# Patient Record
Sex: Female | Born: 1935 | Race: White | Hispanic: No | Smoking: Never smoker
Health system: Southern US, Community
[De-identification: ages and names within clinical notes are randomized; demographics above are authoritative.]

## PROBLEM LIST (undated history)

## (undated) ENCOUNTER — Emergency Department (HOSPITAL_COMMUNITY): Admission: EM | Payer: BLUE CROSS/BLUE SHIELD | Source: Home / Self Care

## (undated) DIAGNOSIS — I10 Essential (primary) hypertension: Secondary | ICD-10-CM

## (undated) DIAGNOSIS — H269 Unspecified cataract: Secondary | ICD-10-CM

## (undated) DIAGNOSIS — K2970 Gastritis, unspecified, without bleeding: Secondary | ICD-10-CM

## (undated) DIAGNOSIS — F0390 Unspecified dementia without behavioral disturbance: Secondary | ICD-10-CM

## (undated) DIAGNOSIS — K649 Unspecified hemorrhoids: Secondary | ICD-10-CM

## (undated) DIAGNOSIS — R55 Syncope and collapse: Secondary | ICD-10-CM

## (undated) DIAGNOSIS — K529 Noninfective gastroenteritis and colitis, unspecified: Secondary | ICD-10-CM

## (undated) DIAGNOSIS — K922 Gastrointestinal hemorrhage, unspecified: Secondary | ICD-10-CM

## (undated) DIAGNOSIS — I8290 Acute embolism and thrombosis of unspecified vein: Secondary | ICD-10-CM

## (undated) DIAGNOSIS — W19XXXA Unspecified fall, initial encounter: Secondary | ICD-10-CM

## (undated) DIAGNOSIS — K449 Diaphragmatic hernia without obstruction or gangrene: Secondary | ICD-10-CM

## (undated) DIAGNOSIS — M1990 Unspecified osteoarthritis, unspecified site: Secondary | ICD-10-CM

## (undated) DIAGNOSIS — K625 Hemorrhage of anus and rectum: Secondary | ICD-10-CM

## (undated) DIAGNOSIS — E785 Hyperlipidemia, unspecified: Secondary | ICD-10-CM

## (undated) DIAGNOSIS — I639 Cerebral infarction, unspecified: Secondary | ICD-10-CM

## (undated) DIAGNOSIS — K2980 Duodenitis without bleeding: Secondary | ICD-10-CM

## (undated) DIAGNOSIS — K8020 Calculus of gallbladder without cholecystitis without obstruction: Secondary | ICD-10-CM

## (undated) HISTORY — DX: Essential (primary) hypertension: I10

## (undated) HISTORY — PX: HIP FRACTURE SURGERY: SHX118

## (undated) HISTORY — DX: Syncope and collapse: R55

## (undated) HISTORY — PX: OTHER SURGICAL HISTORY: SHX169

## (undated) HISTORY — DX: Unspecified hemorrhoids: K64.9

## (undated) HISTORY — DX: Unspecified osteoarthritis, unspecified site: M19.90

## (undated) HISTORY — DX: Hyperlipidemia, unspecified: E78.5

## (undated) HISTORY — DX: Unspecified cataract: H26.9

## (undated) HISTORY — DX: Acute embolism and thrombosis of unspecified vein: I82.90

## (undated) HISTORY — PX: ABDOMINAL HYSTERECTOMY: SHX81

## (undated) HISTORY — DX: Unspecified fall, initial encounter: W19.XXXA

## (undated) HISTORY — DX: Calculus of gallbladder without cholecystitis without obstruction: K80.20

## (undated) HISTORY — DX: Noninfective gastroenteritis and colitis, unspecified: K52.9

---

## 1999-08-02 HISTORY — PX: WRIST FRACTURE SURGERY: SHX121

## 2000-06-30 ENCOUNTER — Encounter: Admission: RE | Admit: 2000-06-30 | Discharge: 2000-06-30 | Payer: Self-pay | Admitting: Family Medicine

## 2000-06-30 ENCOUNTER — Encounter: Payer: Self-pay | Admitting: Family Medicine

## 2000-09-09 ENCOUNTER — Encounter: Payer: Self-pay | Admitting: Family Medicine

## 2000-09-09 ENCOUNTER — Encounter: Admission: RE | Admit: 2000-09-09 | Discharge: 2000-09-09 | Payer: Self-pay | Admitting: Family Medicine

## 2001-09-14 ENCOUNTER — Encounter: Payer: Self-pay | Admitting: Family Medicine

## 2001-09-14 ENCOUNTER — Encounter: Admission: RE | Admit: 2001-09-14 | Discharge: 2001-09-14 | Payer: Self-pay | Admitting: Family Medicine

## 2003-04-13 ENCOUNTER — Encounter: Payer: Self-pay | Admitting: Family Medicine

## 2003-04-13 ENCOUNTER — Encounter: Admission: RE | Admit: 2003-04-13 | Discharge: 2003-04-13 | Payer: Self-pay | Admitting: Family Medicine

## 2003-07-02 HISTORY — PX: OTHER SURGICAL HISTORY: SHX169

## 2004-04-27 ENCOUNTER — Encounter: Admission: RE | Admit: 2004-04-27 | Discharge: 2004-04-27 | Payer: Self-pay | Admitting: Family Medicine

## 2004-08-22 ENCOUNTER — Ambulatory Visit: Payer: Self-pay | Admitting: Family Medicine

## 2004-08-27 ENCOUNTER — Ambulatory Visit: Payer: Self-pay | Admitting: Family Medicine

## 2004-10-04 ENCOUNTER — Ambulatory Visit: Payer: Self-pay | Admitting: Family Medicine

## 2005-04-04 ENCOUNTER — Ambulatory Visit: Payer: Self-pay | Admitting: Family Medicine

## 2005-05-29 ENCOUNTER — Ambulatory Visit: Payer: Self-pay | Admitting: Family Medicine

## 2005-09-20 ENCOUNTER — Ambulatory Visit: Payer: Self-pay | Admitting: Family Medicine

## 2005-10-16 LAB — FECAL OCCULT BLOOD, GUAIAC: Fecal Occult Blood: NEGATIVE

## 2005-10-23 ENCOUNTER — Ambulatory Visit: Payer: Self-pay | Admitting: Family Medicine

## 2005-11-12 ENCOUNTER — Ambulatory Visit: Payer: Self-pay | Admitting: Family Medicine

## 2006-06-18 ENCOUNTER — Encounter: Admission: RE | Admit: 2006-06-18 | Discharge: 2006-06-18 | Payer: Self-pay | Admitting: Family Medicine

## 2007-03-11 ENCOUNTER — Telehealth (INDEPENDENT_AMBULATORY_CARE_PROVIDER_SITE_OTHER): Payer: Self-pay | Admitting: *Deleted

## 2007-03-16 DIAGNOSIS — M810 Age-related osteoporosis without current pathological fracture: Secondary | ICD-10-CM | POA: Insufficient documentation

## 2007-03-16 DIAGNOSIS — I10 Essential (primary) hypertension: Secondary | ICD-10-CM | POA: Insufficient documentation

## 2007-03-16 DIAGNOSIS — M1990 Unspecified osteoarthritis, unspecified site: Secondary | ICD-10-CM | POA: Insufficient documentation

## 2007-03-17 ENCOUNTER — Ambulatory Visit: Payer: Self-pay | Admitting: Family Medicine

## 2007-03-17 DIAGNOSIS — E7800 Pure hypercholesterolemia, unspecified: Secondary | ICD-10-CM | POA: Insufficient documentation

## 2007-03-19 LAB — CONVERTED CEMR LAB
Basophils Absolute: 0 10*3/uL (ref 0.0–0.1)
Basophils Relative: 0.9 % (ref 0.0–1.0)
CO2: 31 meq/L (ref 19–32)
Chloride: 106 meq/L (ref 96–112)
Cholesterol: 174 mg/dL (ref 0–200)
Creatinine, Ser: 1 mg/dL (ref 0.4–1.2)
Eosinophils Absolute: 0.1 10*3/uL (ref 0.0–0.6)
GFR calc non Af Amer: 57 mL/min
Glucose, Bld: 93 mg/dL (ref 70–99)
HCT: 37.4 % (ref 36.0–46.0)
Hemoglobin: 13 g/dL (ref 12.0–15.0)
Lymphocytes Relative: 30.9 % (ref 12.0–46.0)
MCHC: 34.9 g/dL (ref 30.0–36.0)
Monocytes Relative: 8.8 % (ref 3.0–11.0)
Phosphorus: 3.1 mg/dL (ref 2.3–4.6)
Potassium: 3.3 meq/L — ABNORMAL LOW (ref 3.5–5.1)
RDW: 12.6 % (ref 11.5–14.6)
TSH: 1.99 microintl units/mL (ref 0.35–5.50)
Triglycerides: 176 mg/dL — ABNORMAL HIGH (ref 0–149)
VLDL: 35 mg/dL (ref 0–40)
WBC: 4.6 10*3/uL (ref 4.5–10.5)

## 2007-06-23 ENCOUNTER — Encounter: Admission: RE | Admit: 2007-06-23 | Discharge: 2007-06-23 | Payer: Self-pay | Admitting: Family Medicine

## 2007-06-24 ENCOUNTER — Encounter (INDEPENDENT_AMBULATORY_CARE_PROVIDER_SITE_OTHER): Payer: Self-pay | Admitting: *Deleted

## 2007-08-03 ENCOUNTER — Telehealth: Payer: Self-pay | Admitting: Family Medicine

## 2008-06-27 ENCOUNTER — Ambulatory Visit: Payer: Self-pay | Admitting: Family Medicine

## 2008-07-01 LAB — HM DEXA SCAN

## 2008-07-11 ENCOUNTER — Encounter: Admission: RE | Admit: 2008-07-11 | Discharge: 2008-07-11 | Payer: Self-pay | Admitting: Family Medicine

## 2008-07-11 ENCOUNTER — Encounter: Payer: Self-pay | Admitting: Family Medicine

## 2008-07-18 ENCOUNTER — Ambulatory Visit: Payer: Self-pay | Admitting: Family Medicine

## 2008-07-19 LAB — CONVERTED CEMR LAB
ALT: 14 units/L (ref 0–35)
Albumin: 4.3 g/dL (ref 3.5–5.2)
Basophils Relative: 0.3 % (ref 0.0–3.0)
Bilirubin, Direct: 0.1 mg/dL (ref 0.0–0.3)
CO2: 31 meq/L (ref 19–32)
Calcium: 9.7 mg/dL (ref 8.4–10.5)
Chloride: 104 meq/L (ref 96–112)
Creatinine, Ser: 1.1 mg/dL (ref 0.4–1.2)
Eosinophils Absolute: 0.2 10*3/uL (ref 0.0–0.7)
GFR calc Af Amer: 62 mL/min
GFR calc non Af Amer: 51 mL/min
Glucose, Bld: 98 mg/dL (ref 70–99)
HCT: 40.8 % (ref 36.0–46.0)
HDL: 54.6 mg/dL (ref >39.0)
LDL Cholesterol: 99 mg/dL (ref 0–99)
Lymphocytes Relative: 24.5 % (ref 12.0–46.0)
MCV: 88.3 fL (ref 78.0–100.0)
Monocytes Absolute: 0.6 10*3/uL (ref 0.1–1.0)
Neutro Abs: 3.4 10*3/uL (ref 1.4–7.7)
Phosphorus: 3 mg/dL (ref 2.3–4.6)
Potassium: 3.3 meq/L — ABNORMAL LOW (ref 3.5–5.1)
TSH: 1.97 microintl units/mL (ref 0.35–5.50)
Total CHOL/HDL Ratio: 3.3
VLDL: 25 mg/dL (ref 0–40)

## 2008-08-02 ENCOUNTER — Ambulatory Visit: Payer: Self-pay | Admitting: Family Medicine

## 2008-08-17 ENCOUNTER — Encounter: Admission: RE | Admit: 2008-08-17 | Discharge: 2008-08-17 | Payer: Self-pay | Admitting: Family Medicine

## 2008-08-23 ENCOUNTER — Encounter (INDEPENDENT_AMBULATORY_CARE_PROVIDER_SITE_OTHER): Payer: Self-pay | Admitting: *Deleted

## 2008-10-06 ENCOUNTER — Ambulatory Visit: Payer: Self-pay | Admitting: Family Medicine

## 2008-10-10 LAB — CONVERTED CEMR LAB: Vit D, 25-Hydroxy: 31 ng/mL (ref 30–89)

## 2009-01-09 ENCOUNTER — Ambulatory Visit: Payer: Self-pay | Admitting: Family Medicine

## 2009-01-10 ENCOUNTER — Encounter: Payer: Self-pay | Admitting: Family Medicine

## 2009-01-11 LAB — CONVERTED CEMR LAB: Vit D, 25-Hydroxy: 21 ng/mL — ABNORMAL LOW (ref 30–89)

## 2009-03-25 ENCOUNTER — Ambulatory Visit: Payer: Self-pay | Admitting: Family Medicine

## 2009-03-25 DIAGNOSIS — S20229A Contusion of unspecified back wall of thorax, initial encounter: Secondary | ICD-10-CM | POA: Insufficient documentation

## 2009-03-27 ENCOUNTER — Ambulatory Visit: Payer: Self-pay | Admitting: Family Medicine

## 2009-03-28 ENCOUNTER — Ambulatory Visit: Payer: Self-pay | Admitting: Family Medicine

## 2009-03-28 DIAGNOSIS — E559 Vitamin D deficiency, unspecified: Secondary | ICD-10-CM | POA: Insufficient documentation

## 2009-03-31 ENCOUNTER — Encounter: Payer: Self-pay | Admitting: Family Medicine

## 2009-04-04 ENCOUNTER — Telehealth: Payer: Self-pay | Admitting: Family Medicine

## 2009-04-04 ENCOUNTER — Encounter: Payer: Self-pay | Admitting: Family Medicine

## 2009-08-18 ENCOUNTER — Encounter: Admission: RE | Admit: 2009-08-18 | Discharge: 2009-08-18 | Payer: Self-pay | Admitting: Family Medicine

## 2009-08-22 ENCOUNTER — Encounter: Payer: Self-pay | Admitting: Family Medicine

## 2010-01-19 ENCOUNTER — Ambulatory Visit: Payer: Self-pay | Admitting: Family Medicine

## 2010-01-20 ENCOUNTER — Encounter: Payer: Self-pay | Admitting: Family Medicine

## 2010-01-22 ENCOUNTER — Encounter: Payer: Self-pay | Admitting: Family Medicine

## 2010-01-22 LAB — CONVERTED CEMR LAB
ALT: 18 units/L (ref 0–35)
BUN: 13 mg/dL (ref 6–23)
Eosinophils Absolute: 0.2 10*3/uL (ref 0.0–0.7)
Eosinophils Relative: 2.6 % (ref 0.0–5.0)
LDL Cholesterol: 82 mg/dL (ref 0–99)
MCHC: 34.2 g/dL (ref 30.0–36.0)
MCV: 91.2 fL (ref 78.0–100.0)
Monocytes Relative: 7.9 % (ref 3.0–12.0)
Neutro Abs: 3.7 10*3/uL (ref 1.4–7.7)
Phosphorus: 3.1 mg/dL (ref 2.3–4.6)
RBC: 4.5 M/uL (ref 3.87–5.11)
Sodium: 144 meq/L (ref 135–145)
TSH: 1.75 microintl units/mL (ref 0.35–5.50)
Triglycerides: 166 mg/dL — ABNORMAL HIGH (ref 0.0–149.0)
VLDL: 33.2 mg/dL (ref 0.0–40.0)
Vit D, 25-Hydroxy: 29 ng/mL — ABNORMAL LOW (ref 30–89)

## 2010-04-23 ENCOUNTER — Telehealth: Payer: Self-pay | Admitting: Family Medicine

## 2010-04-26 ENCOUNTER — Ambulatory Visit: Payer: Self-pay | Admitting: Family Medicine

## 2010-05-01 LAB — CONVERTED CEMR LAB: Vit D, 25-Hydroxy: 53 ng/mL (ref 30–89)

## 2010-07-21 ENCOUNTER — Other Ambulatory Visit: Payer: Self-pay | Admitting: Family Medicine

## 2010-07-21 DIAGNOSIS — Z1239 Encounter for other screening for malignant neoplasm of breast: Secondary | ICD-10-CM

## 2010-07-23 ENCOUNTER — Other Ambulatory Visit: Payer: Self-pay | Admitting: Family Medicine

## 2010-07-23 ENCOUNTER — Ambulatory Visit
Admission: RE | Admit: 2010-07-23 | Discharge: 2010-07-23 | Payer: Self-pay | Source: Home / Self Care | Attending: Family Medicine | Admitting: Family Medicine

## 2010-07-23 DIAGNOSIS — M79609 Pain in unspecified limb: Secondary | ICD-10-CM | POA: Insufficient documentation

## 2010-07-23 LAB — RENAL FUNCTION PANEL
Albumin: 4.6 g/dL (ref 3.5–5.2)
Calcium: 10.4 mg/dL (ref 8.4–10.5)
Creatinine, Ser: 1.2 mg/dL (ref 0.4–1.2)
Glucose, Bld: 94 mg/dL (ref 70–99)
Phosphorus: 3.2 mg/dL (ref 2.3–4.6)
Sodium: 138 mEq/L (ref 135–145)

## 2010-07-23 LAB — LIPID PANEL
Cholesterol: 211 mg/dL — ABNORMAL HIGH (ref 0–200)
Total CHOL/HDL Ratio: 3
Triglycerides: 165 mg/dL — ABNORMAL HIGH (ref 0.0–149.0)
VLDL: 33 mg/dL (ref 0.0–40.0)

## 2010-07-23 LAB — AST: AST: 21 U/L (ref 0–37)

## 2010-07-23 LAB — ALT: ALT: 12 U/L (ref 0–35)

## 2010-07-31 NOTE — Miscellaneous (Signed)
Summary: Vitamin D 2000iui med update list  Clinical Lists Changes  Medications: Changed medication from VITAMIN D 2000 UNIT TABS (CHOLECALCIFEROL) Take 1 tablet by mouth once a day to VITAMIN D 2000 UNIT TABS (CHOLECALCIFEROL) Take 2  tablets  by mouth once a day     Current Allergies: ! VITAMIN D (CHOLECALCIFEROL) NORVASC

## 2010-07-31 NOTE — Progress Notes (Signed)
Summary: regarding simvastatin  Phone Note From Pharmacy   [facility]: [facility]  [address] [id number] Summary of Call: Pharmacy is asking if you still want pt to take 80 mg's of simvastatin, with new recommendations that are out regarding geriatric patients. Initial call taken by: Lowella Petties CMA, AAMA,  April 23, 2010 2:21 PM  Follow-up for Phone Call        I'm fine with cutting it to 40 since her labs have been good  let pt know that I am cutting it due to recent geriatric rec for dosing px written on EMR for call in  Follow-up by: Judith Part MD,  April 23, 2010 2:44 PM  Additional Follow-up for Phone Call Additional follow up Details #1::        Left message for patient to call back. [facility] as instructed. Lewanda Rife LPN  April 23, 2010 4:41 PM   Patient notified as instructed by telephone. Lewanda Rife LPN  April 24, 2010 1:35 PM     New/Updated Medications: ZOCOR 40 MG TABS (SIMVASTATIN) 1 by mouth once daily Prescriptions: ZOCOR 40 MG TABS (SIMVASTATIN) 1 by mouth once daily  #30 x 11   Entered and Authorized by:   Judith Part MD   Signed by:   Lewanda Rife LPN on 02/72/5366   Method used:   Telephoned to ...       [facility] [facility] [address] [id number]       [address]       [place], [place]  [address]       Ph: [phone]       Fax: [phone]   RxID:   [id number]

## 2010-07-31 NOTE — Letter (Signed)
Summary: Results Follow up Letter  [place] at [facility]  [address] [place], [place] [address]   Phone: [phone]  Fax: [phone]    08/22/2009 MRN: [id number]    [address] [facility] [address] [place], [place]  [address]    Dear [name],  The following are the results of your recent test(s):  Test         Result    Pap Smear:        Normal _____  Not Normal _____ Comments: ______________________________________________________ Cholesterol: LDL(Bad cholesterol):         Your goal is less than:         HDL (Good cholesterol):       Your goal is more than: Comments:  ______________________________________________________ Mammogram:        Normal __X___  Not Normal _____ Comments:Please repeat in one year.  ___________________________________________________________________ Hemoccult:        Normal _____  Not normal _______ Comments:    _____________________________________________________________________ Other Tests:    We routinely do not discuss normal results over the telephone.  If you desire a copy of the results, or you have any questions about this information we can discuss them at your next office visit.   Sincerely,    Roxy Manns, MD  MT/ri

## 2010-07-31 NOTE — Assessment & Plan Note (Signed)
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 Vitamin D (Cholecalciferol) 2)  Norvasc  Past History:  Past Medical History: Last updated: 06/27/2008 Hypertension Osteoarthritis Osteoporosis hyperlipidemia  Past Surgical History: Last updated: 07/14/2008 Hysterectomy/ BSO- fibroids Right arm fracture- distal radius (08/1999) ABI's- normal  Left foot brace Dexa- osteoporosis (07/2003) Dexa- OP (1/10)- worse at [facility] and imp at [facility]  Family History: Last updated: 03/16/2007 Father: MI, HTN Mother: MI,HTN Siblings: 4 brothers- 1 with throat cancer. 1 sister  Social History: Last updated: 06/27/2008 Marital Status: widowed Children:four daughters, 1 son Occupation: retired lives with 2 teenage grandkids and her daughter   Risk Factors: Smoking Status: never (03/16/2007)  Review of Systems General:  Denies fatigue, loss of appetite, and malaise. Eyes:  Denies blurring and eye irritation. CV:  Denies chest pain or discomfort, lightheadness, palpitations, shortness of breath with exertion, and swelling of feet. Resp:  Denies cough, shortness of breath, and wheezing. GI:  Denies abdominal pain, change in bowel habits, indigestion, nausea,  and vomiting. GU:  Denies dysuria and hematuria. MS:  Complains of joint pain and stiffness; denies cramps and muscle weakness. Derm:  Denies itching, lesion(s), poor wound healing, and rash. Neuro:  Denies numbness, tingling, and weakness. Psych:  Denies anxiety and depression. Endo:  Denies cold intolerance, excessive thirst, excessive urination, and heat intolerance. Heme:  Denies abnormal bruising and bleeding.  Physical Exam  General:  elderly female in no distress Head:  normocephalic, atraumatic, and no abnormalities observed.   Eyes:  vision grossly intact, pupils equal, pupils round, and pupils reactive to light.   Mouth:  pharynx pink and moist.   Neck:  supple with full rom and no masses or thyromegally, no JVD or carotid bruit  Chest Wall:  No deformities, masses, or tenderness noted. Lungs:  Normal respiratory effort, chest expands symmetrically. Lungs are clear to auscultation, no crackles or wheezes. Heart:  Normal rate and regular rhythm. S1 and S2 normal without gallop, murmur, click, rub or other extra sounds. Abdomen:  Bowel sounds positive,abdomen soft and non-tender without masses, organomegaly or hernias noted. no renal bruits  Msk:  No deformity or scoliosis noted of thoracic or lumbar spine.  some changes of OA Pulses:  R and L carotid,radial,femoral,dorsalis pedis and posterior tibial pulses are full and equal bilaterally Extremities:  No clubbing, cyanosis, edema, or deformity noted with normal full range of motion of all joints.   Neurologic:  sensation intact to  light touch, gait normal, and DTRs symmetrical and normal.   Skin:  Intact without suspicious lesions or rashes Cervical Nodes:  No lymphadenopathy noted Psych:  normal affect, talkative and pleasant  mentally sharp   Impression & Recommendations:  Problem # 1:  UNSPECIFIED VITAMIN D DEFICIENCY (ICD-268.9) Assessment Unchanged ? unsure if it upset her stomach will check bottles at home check  level disc diff opt for otc suppl also getting some outdoor time Orders: Venipuncture (16109) TLB-Renal Function Panel (80069-RENAL) TLB-Lipid Panel (80061-LIPID) TLB-ALT (SGPT) (84460-ALT) TLB-AST (SGOT) (84450-SGOT) TLB-TSH (Thyroid Stimulating Hormone) (84443-TSH) TLB-CBC Platelet - w/Differential (85025-CBCD) T-Vitamin D (25-Hydroxy) (60454-09811) Specimen Handling (91478)  Problem # 2:  HYPERCHOLESTEROLEMIA, PURE (ICD-272.0) Assessment: Unchanged  labs today on zocor  has been on this dose long term- will not change unless problems  disc low sat fat diet  Her updated medication list for this problem includes:    Zocor 80 Mg Tabs (Simvastatin) ..... One by mouth once daily  Orders: Venipuncture (29562) TLB-Renal Function Panel (80069-RENAL) TLB-Lipid Panel (80061-LIPID) TLB-ALT (SGPT) (84460-ALT) TLB-AST (SGOT) (84450-SGOT) TLB-TSH (Thyroid Stimulating Hormone) (84443-TSH) TLB-CBC Platelet - w/Differential (85025-CBCD) Prescription Created Electronically [id number])  Labs Reviewed: SGOT: 22 (07/18/2008)   SGPT: 14 (07/18/2008)  Prior 10 Yr Risk Heart Disease: 7 % (03/28/2009)   HDL:54.6 (07/18/2008), 46.2 (03/17/2007)  LDL:99 (07/18/2008), 93 (03/17/2007)  Chol:179 (07/18/2008), 174 (03/17/2007)  Trig:126 (07/18/2008), 176 (03/17/2007)  Problem # 3:  HYPERTENSION (ICD-401.9) Assessment: Unchanged  is fairly controlled without problems meds refilled labs today commended on staying active  Her updated medication list for this problem includes:    Metoprolol Tartrate 50 Mg Tabs (Metoprolol tartrate) .Marland Kitchen... 1 by mouth two times a day    Maxzide 75-50 Mg Tabs (Triamterene-hctz) ..... One by mouth once daily  Orders: Venipuncture (57846) TLB-Renal Function Panel (80069-RENAL) TLB-Lipid Panel (80061-LIPID) TLB-ALT (SGPT) (84460-ALT) TLB-AST (SGOT) (84450-SGOT) TLB-TSH (Thyroid Stimulating Hormone) (84443-TSH) TLB-CBC Platelet - w/Differential  (85025-CBCD) Prescription Created Electronically [phone])  BP today: 138/86 Prior BP: 122/80 (03/28/2009)  Prior 10 Yr Risk Heart Disease: 7 % (03/28/2009)  Labs Reviewed: K+: 3.5 (08/02/2008) Creat: : 1.1 (07/18/2008)   Chol: 179 (07/18/2008)   HDL: 54.6 (07/18/2008)   LDL: 99 (07/18/2008)   TG: 126 (07/18/2008)  Complete Medication List: 1)  Metoprolol Tartrate 50 Mg Tabs (Metoprolol tartrate) .Marland Kitchen.. 1 by mouth two times a day 2)  Fosamax 70 Mg Tabs (Alendronate sodium) .... One by mouth q week 3)  Maxzide 75-50 Mg Tabs (Triamterene-hctz) .... One by mouth once daily 4)  Zocor 80 Mg Tabs (Simvastatin) .... One by mouth once daily 5)  Klor-con 10 10 Meq Tbcr (Potassium chloride) .... Two tabs by mouth daily 6)  Vitamin D 2000 Unit Tabs (Cholecalciferol) .... Take 1 tablet by mouth once a day 7)  Miacalcin 200 Unit/ml Soln (Calcitonin (salmon)) .Marland Kitchen.. 1 spray in one nostril each day- alternate nostrils 8)  Vitamin E 200 Unit Caps (Vitamin e) .... Take 1 capsule by mouth once a day  Patient Instructions: 1)  aim for total of 2000 international units of vitamin D daily- over the counter a pill or liquid form is fine  2)  if this upsets your stomach-let me know 3)  stay active 4)  no medicine changes  5)  labs today 6)  follow up in 6 months  Prescriptions: MIACALCIN 200 UNIT/ML SOLN (CALCITONIN (SALMON)) 1 spray in one nostril each day- alternate nostrils  #2 mdi x 11   Entered and Authorized by:  Judith Part MD   Signed by:   Judith Part MD on 01/19/2010   Method used:   [facility] to        [id number]       [address]       [place], [place]  [address]       Ph: [phone]       Fax: [phone]   RxID:   [id number] KLOR-CON 10 10 MEQ  TBCR (POTASSIUM CHLORIDE) two tabs by mouth daily  #60 x 11   Entered and Authorized by:   Judith Part MD   Signed by:   Judith Part MD on 01/19/2010   Method used:    Electronically to        [id number]       [address]       [place], [place]  [address]       Ph: [phone]       Fax: [phone]   RxID:   [id number] ZOCOR 80 MG  TABS (SIMVASTATIN) one by mouth once daily  #30 x 11   Entered and Authorized by:   Judith Part MD   Signed by:   Judith Part MD on 01/19/2010   Method used:   Electronically to        [facility] [address] [facility]       [address]       [place], [place]  [address]       Ph: [phone]       Fax: [phone]   RxID:   [id number] MAXZIDE 75-50 MG  TABS (TRIAMTERENE-HCTZ) one by mouth once daily  #30 x 11   Entered and Authorized by:   Judith Part MD   Signed by:   Judith Part MD on 01/19/2010   Method used:   Electronically to        [facility]       [address]       [place], [place]  [address]       Ph: [phone]       Fax: [phone]   RxID:   [id number] FOSAMAX 70 MG  TABS (ALENDRONATE SODIUM) one by mouth q week  #4 x 11   Entered and Authorized by:   Judith Part MD   Signed by:   Judith Part MD on 01/19/2010   Method used:   Electronically to        [facility]       [address]       [place], [place]  [address]       Ph: [phone]       Fax: [phone]   RxID:   [id number]   Current Allergies (reviewed today): ! VITAMIN D (CHOLECALCIFEROL) NORVASC

## 2010-07-31 NOTE — Miscellaneous (Signed)
Summary: Mammogram screening  Clinical Lists Changes  Observations: Added new observation of MAMMO DUE: 08/2010 (08/22/2009 16:00) Added new observation of MAMMOGRAM: normal (08/18/2009 16:00)      Preventive Care Screening  Mammogram:    Date:  08/18/2009    Next Due:  08/2010    Results:  normal

## 2010-08-02 NOTE — Assessment & Plan Note (Signed)
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 Vitamin D (Cholecalciferol) 2)  Norvasc 3)  Miacalcin  Past History:  Past Medical History: Last updated: 06/27/2008 Hypertension Osteoarthritis Osteoporosis hyperlipidemia  Past Surgical History: Last updated: 07/14/2008 Hysterectomy/ BSO- fibroids Right arm fracture- distal radius (08/1999) ABI's- normal  Left foot brace Dexa- osteoporosis (07/2003) Dexa- OP (1/10)- worse at [place] and imp at [facility]  Family History: Last updated: 03/16/2007 Father: MI, HTN Mother: MI,HTN Siblings: 4 brothers- 1 with throat cancer. 1 sister  Social History: Last updated: 06/27/2008 Marital Status: widowed Children:four daughters, 1 son Occupation: retired lives with 2 teenage grandkids and her daughter   Risk Factors: Smoking Status: never (03/16/2007)  Review of Systems General:  Denies fatigue, loss of appetite, and malaise. Eyes:   Denies blurring and eye irritation. CV:  Denies chest pain or discomfort, palpitations, shortness of breath with exertion, and swelling of feet. Resp:  Denies cough and shortness of breath. GI:  Denies abdominal pain, indigestion, and nausea. GU:  Denies dysuria and urinary frequency. MS:  Complains of joint pain and stiffness; denies cramps and muscle weakness. Derm:  Denies itching, lesion(s), poor wound healing, and rash. Neuro:  Denies numbness and tingling. Psych:  mood is ok . Endo:  Denies cold intolerance, excessive thirst, excessive urination, and heat intolerance. Heme:  Denies abnormal bruising and bleeding.  Physical Exam  General:  elderly- well appearing/ overwt but recent wt loss noted  Head:  normocephalic, atraumatic, and no abnormalities observed.   Eyes:  vision grossly intact, pupils equal, pupils round, and pupils reactive to light.  no conjunctival pallor, injection or icterus  Mouth:  pharynx pink and moist.   Neck:  supple with full rom and no masses or thyromegally, no JVD or carotid bruit  Chest Wall:  No deformities, masses, or tenderness noted. Lungs:  Normal respiratory effort, chest expands symmetrically. Lungs are clear to auscultation, no crackles or wheezes. Heart:  Normal rate and regular rhythm. S1 and S2 normal without gallop, murmur, click, rub or other extra sounds. Abdomen:  Bowel sounds positive,abdomen soft and non-tender without masses,  organomegaly or hernias noted. no renal bruits  Msk:  nl rom R knee and ankle no swelling or tenderness favors L leg without ankle brace  Pulses:  R and L carotid,radial,femoral,dorsalis pedis and posterior tibial pulses are full and equal bilaterally Extremities:  No clubbing, cyanosis, edema, or deformity noted with normal full range of motion of all joints.   Neurologic:  sensation intact to light touch, gait normal, and DTRs symmetrical and normal.   Skin:  Intact without suspicious lesions or  rashes Cervical Nodes:  No lymphadenopathy noted Inguinal Nodes:  No significant adenopathy Psych:  normal affect, talkative and pleasant    Impression & Recommendations:  Problem # 1:  HYPERCHOLESTEROLEMIA, PURE (ICD-272.0) Assessment Unchanged  labs today with zocor and good diet  wt loss noted from smaller portions enc to stay active lab today f/u 6 mo  Her updated medication list for this problem includes:    Zocor 40 Mg Tabs (Simvastatin) .Marland Kitchen... 1 by mouth once daily  Orders: Venipuncture (16109) TLB-Lipid Panel (80061-LIPID) TLB-Renal Function Panel (80069-RENAL) TLB-ALT (SGPT) (84460-ALT) TLB-AST (SGOT) (84450-SGOT)  Labs Reviewed: SGOT: 24 (01/19/2010)   SGPT: 18 (01/19/2010)  Prior 10 Yr Risk Heart Disease: 7 % (03/28/2009)   HDL:62.60 (01/19/2010), 54.6 (07/18/2008)  LDL:82 (01/19/2010), 99 (07/18/2008)  Chol:178 (01/19/2010), 179 (07/18/2008)  Trig:166.0 (01/19/2010), 126 (07/18/2008)  Problem # 2:  HYPERTENSION (ICD-401.9) great control no hypotensive symptoms  stays active will keep an eye on her wt  lab today f/u 6 mo  Her updated medication list for this problem includes:    Metoprolol Tartrate 50 Mg Tabs (Metoprolol tartrate) .Marland Kitchen... 1 by mouth two times a day    Maxzide 75-50 Mg Tabs (Triamterene-hctz) ..... One by mouth once daily  Orders: Venipuncture (60454) TLB-Lipid Panel (80061-LIPID) TLB-Renal Function Panel (80069-RENAL) TLB-ALT (SGPT) (84460-ALT) TLB-AST (SGOT) (84450-SGOT)  Problem # 3:  LEG PAIN, RIGHT (ICD-729.5) Assessment: New I think this is due to gait disorder from chronic ankle pain (old injury and oa) getting better with brace great rom knee if not further imp will call- consider sport med visit   Problem # 4:  OSTEOPOROSIS (ICD-733.00) Assessment: Unchanged pt off miacalcin due to side eff continues on fosamax  The following medications were removed from the medication list:    Miacalcin 200 Unit/ml Soln (Calcitonin  (salmon)) .Marland Kitchen... 1 spray in one nostril each day- alternate nostrils Her updated medication list for this problem includes:    Fosamax 70 Mg Tabs (Alendronate sodium) ..... One by mouth q week    Vitamin D 2000 Unit Tabs (Cholecalciferol) .Marland Kitchen... Take 2  tablets  by mouth once a day    Calcium 500 Mg Tabs (Calcium) ..... One tablet by mouth three times a day  Complete Medication List: 1)  Metoprolol Tartrate 50 Mg Tabs (Metoprolol tartrate) .Marland Kitchen.. 1 by mouth two times a day 2)  Fosamax 70 Mg Tabs (Alendronate sodium) .... One by mouth q week 3)  Maxzide 75-50 Mg Tabs (Triamterene-hctz) .... One by mouth once daily 4)  Zocor 40 Mg Tabs (Simvastatin) .Marland Kitchen.. 1 by mouth once daily 5)  Klor-con 10 10 Meq Tbcr (Potassium chloride) .... Two tabs by mouth daily 6)  Vitamin D 2000 Unit Tabs (Cholecalciferol) .... Take 2  tablets  by mouth once a day 7)  Calcium 500 Mg Tabs (Calcium) .... One tablet by mouth three times a day  Patient Instructions: 1)  labs today  2)  update me if appetite worsens or or concerns about wt  loss 3)  stay active 4)  continue your ankle brace 5)  if leg if not improved in 2 weeks -- please call and let me know  6)  take good care of yourself  7)  follow up with me in 6 months    Orders Added: 1)  Venipuncture [36415] 2)  TLB-Lipid Panel [80061-LIPID] 3)  TLB-Renal Function Panel [80069-RENAL] 4)  TLB-ALT (SGPT) [84460-ALT] 5)  TLB-AST (SGOT) [84450-SGOT] 6)  Est. Patient Level IV [16109]    Current Allergies (reviewed today): ! VITAMIN D (CHOLECALCIFEROL) NORVASC MIACALCIN

## 2010-08-13 ENCOUNTER — Encounter: Payer: Self-pay | Admitting: Family Medicine

## 2010-08-20 ENCOUNTER — Ambulatory Visit
Admission: RE | Admit: 2010-08-20 | Discharge: 2010-08-20 | Disposition: A | Discharge status: Home / Self Care | Payer: Medicare Other | Source: Ambulatory Visit | Attending: Family Medicine | Admitting: Family Medicine

## 2010-08-20 DIAGNOSIS — Z1239 Encounter for other screening for malignant neoplasm of breast: Secondary | ICD-10-CM

## 2010-08-22 ENCOUNTER — Encounter (INDEPENDENT_AMBULATORY_CARE_PROVIDER_SITE_OTHER): Payer: Self-pay | Admitting: *Deleted

## 2010-08-28 ENCOUNTER — Encounter: Payer: Self-pay | Admitting: Family Medicine

## 2010-08-28 NOTE — Letter (Signed)
Summary: Results Follow up Letter  [place] at [facility]  [address] [place], [place] [address]   Phone: [phone]  Fax: [phone]    08/22/2010 MRN: [id number]  [address] [facility] [address] [place], [place]  [address]  Dear [name],  The following are the results of your recent test(s):  Test         Result    Pap Smear:        Normal _____  Not Normal _____ Comments: ______________________________________________________ Cholesterol: LDL(Bad cholesterol):         Your goal is less than:         HDL (Good cholesterol):       Your goal is more than: Comments:  ______________________________________________________ Mammogram:        Normal __X___  Not Normal _____ Comments: Repeat in 1 year  ___________________________________________________________________ Hemoccult:        Normal _____  Not normal _______ Comments:    _____________________________________________________________________ Other Tests:    We routinely do not discuss normal results over the telephone.  If you desire a copy of the results, or you have any questions about this information we can discuss them at your next office visit.   Sincerely,       Sharilyn Sites for Dr. Roxy Manns

## 2010-08-28 NOTE — Miscellaneous (Signed)
Summary: Mammogram to flowsheet  Clinical Lists Changes  Observations: Added new observation of MAMMO DUE: 08/2011 (08/22/2010 8:30) Added new observation of MAMMOGRAM: normal (08/20/2010 8:30)      Preventive Care Screening  Mammogram:    Date:  08/20/2010    Next Due:  08/2011    Results:  normal

## 2010-09-06 NOTE — Letter (Signed)
Summary: [facility]   Imported By: Sherian Rein 08/27/2010 10:07:27  _____________________________________________________________________  External Attachment:    Type:   Image     Comment:   External Document

## 2010-09-11 NOTE — Letter (Signed)
Summary: [facility]   Imported By: Kassie Mends 09/04/2010 08:23:10  _____________________________________________________________________  External Attachment:    Type:   Image     Comment:   External Document

## 2010-09-12 ENCOUNTER — Other Ambulatory Visit: Payer: Self-pay | Admitting: Family Medicine

## 2010-09-12 ENCOUNTER — Other Ambulatory Visit (INDEPENDENT_AMBULATORY_CARE_PROVIDER_SITE_OTHER): Payer: Medicare Other

## 2010-09-12 ENCOUNTER — Encounter: Payer: Self-pay | Admitting: *Deleted

## 2010-09-12 DIAGNOSIS — E7800 Pure hypercholesterolemia, unspecified: Secondary | ICD-10-CM

## 2010-09-12 DIAGNOSIS — E785 Hyperlipidemia, unspecified: Secondary | ICD-10-CM

## 2010-09-12 DIAGNOSIS — M79609 Pain in unspecified limb: Secondary | ICD-10-CM

## 2010-09-12 LAB — CREATININE, SERUM: Creatinine, Ser: 1.2 mg/dL (ref 0.4–1.2)

## 2010-09-12 LAB — LIPID PANEL
Cholesterol: 193 mg/dL (ref 0–200)
Triglycerides: 204 mg/dL — ABNORMAL HIGH (ref 0.0–149.0)
VLDL: 40.8 mg/dL — ABNORMAL HIGH (ref 0.0–40.0)

## 2010-09-12 LAB — LDL CHOLESTEROL, DIRECT: Direct LDL: 99.7 mg/dL

## 2010-09-25 ENCOUNTER — Other Ambulatory Visit: Payer: Self-pay

## 2011-01-05 ENCOUNTER — Emergency Department (HOSPITAL_COMMUNITY): Payer: Medicare Other

## 2011-01-05 ENCOUNTER — Inpatient Hospital Stay (HOSPITAL_COMMUNITY)
Admission: EM | Admit: 2011-01-05 | Discharge: 2011-01-11 | DRG: 865 | Disposition: A | Discharge status: Home Health | Payer: Medicare Other | Attending: Internal Medicine | Admitting: Internal Medicine

## 2011-01-05 DIAGNOSIS — K5900 Constipation, unspecified: Secondary | ICD-10-CM | POA: Diagnosis present

## 2011-01-05 DIAGNOSIS — A938 Other specified arthropod-borne viral fevers: Principal | ICD-10-CM | POA: Diagnosis present

## 2011-01-05 DIAGNOSIS — R42 Dizziness and giddiness: Secondary | ICD-10-CM

## 2011-01-05 DIAGNOSIS — D696 Thrombocytopenia, unspecified: Secondary | ICD-10-CM | POA: Diagnosis present

## 2011-01-05 DIAGNOSIS — E538 Deficiency of other specified B group vitamins: Secondary | ICD-10-CM | POA: Diagnosis present

## 2011-01-05 DIAGNOSIS — N390 Urinary tract infection, site not specified: Secondary | ICD-10-CM | POA: Diagnosis present

## 2011-01-05 DIAGNOSIS — D709 Neutropenia, unspecified: Secondary | ICD-10-CM | POA: Diagnosis present

## 2011-01-05 DIAGNOSIS — M810 Age-related osteoporosis without current pathological fracture: Secondary | ICD-10-CM | POA: Diagnosis present

## 2011-01-05 DIAGNOSIS — R7401 Elevation of levels of liver transaminase levels: Secondary | ICD-10-CM | POA: Diagnosis present

## 2011-01-05 DIAGNOSIS — G9340 Encephalopathy, unspecified: Secondary | ICD-10-CM | POA: Diagnosis present

## 2011-01-05 DIAGNOSIS — E860 Dehydration: Secondary | ICD-10-CM | POA: Diagnosis present

## 2011-01-05 DIAGNOSIS — I959 Hypotension, unspecified: Secondary | ICD-10-CM | POA: Diagnosis present

## 2011-01-05 DIAGNOSIS — D61818 Other pancytopenia: Secondary | ICD-10-CM | POA: Diagnosis present

## 2011-01-05 DIAGNOSIS — M216X9 Other acquired deformities of unspecified foot: Secondary | ICD-10-CM | POA: Diagnosis present

## 2011-01-05 DIAGNOSIS — H8110 Benign paroxysmal vertigo, unspecified ear: Secondary | ICD-10-CM | POA: Diagnosis present

## 2011-01-05 DIAGNOSIS — I10 Essential (primary) hypertension: Secondary | ICD-10-CM | POA: Diagnosis present

## 2011-01-05 DIAGNOSIS — R7402 Elevation of levels of lactic acid dehydrogenase (LDH): Secondary | ICD-10-CM | POA: Diagnosis present

## 2011-01-05 LAB — CK TOTAL AND CKMB (NOT AT ARMC)
CK, MB: 1.9 ng/mL (ref 0.3–4.0)
Relative Index: INVALID (ref 0.0–2.5)
Total CK: 98 U/L (ref 7–177)

## 2011-01-05 LAB — CBC
HCT: 41.8 % (ref 36.0–46.0)
Hemoglobin: 14.5 g/dL (ref 12.0–15.0)
MCHC: 34.7 g/dL (ref 30.0–36.0)
RDW: 12.8 % (ref 11.5–15.5)
WBC: 3.5 10*3/uL — ABNORMAL LOW (ref 4.0–10.5)

## 2011-01-05 LAB — DIFFERENTIAL
Basophils Absolute: 0 10*3/uL (ref 0.0–0.1)
Basophils Relative: 0 % (ref 0–1)
Lymphocytes Relative: 9 % — ABNORMAL LOW (ref 12–46)
Neutro Abs: 2.9 10*3/uL (ref 1.7–7.7)

## 2011-01-05 LAB — POCT I-STAT, CHEM 8
HCT: 44 % (ref 36.0–46.0)
Hemoglobin: 15 g/dL (ref 12.0–15.0)
Potassium: 2.9 mEq/L — ABNORMAL LOW (ref 3.5–5.1)
Sodium: 136 mEq/L (ref 135–145)
TCO2: 28 mmol/L (ref 0–100)

## 2011-01-05 LAB — URINALYSIS, ROUTINE W REFLEX MICROSCOPIC
Bilirubin Urine: NEGATIVE
Glucose, UA: NEGATIVE mg/dL
Nitrite: NEGATIVE
Specific Gravity, Urine: 1.023 (ref 1.005–1.030)
pH: 5.5 (ref 5.0–8.0)

## 2011-01-05 LAB — LIPID PANEL
Cholesterol: 152 mg/dL (ref 0–200)
LDL Cholesterol: 78 mg/dL (ref 0–99)
Triglycerides: 120 mg/dL (ref <150)
VLDL: 24 mg/dL (ref 0–40)

## 2011-01-05 LAB — CARDIAC PANEL(CRET KIN+CKTOT+MB+TROPI)
CK, MB: 1.9 ng/mL (ref 0.3–4.0)
Relative Index: INVALID (ref 0.0–2.5)
Troponin I: <0.3 ng/mL (ref <0.30)

## 2011-01-05 LAB — URINE MICROSCOPIC-ADD ON

## 2011-01-05 NOTE — H&P (Signed)
Wendy Krueger, Wendy Krueger                ACCOUNT NO.:  [id number]  MEDICAL RECORD NO.:  [id number]  LOCATION:  [facility]                         FACILITY:  [facility]  PHYSICIAN:  Zannie Cove, MD     DATE OF BIRTH:  June 17, 1930  DATE OF ADMISSION:  01/05/2011 DATE OF DISCHARGE:                             HISTORY & PHYSICAL   PRIMARY CARE PHYSICIAN:  Marne A. Tower, MD  CHIEF COMPLAINT:  Not quite acting right.  HISTORY OF PRESENT ILLNESS:  Ms. Wendy Krueger is a 77 year old female with past history of hypertension and dyslipidemia.  She is brought here by her family today with the above-mentioned complaint.  The patient's family reports that yesterday she was tired all day and laid in bed pretty much most of the day with the exception of getting up to eat her lunch and supper and then when she woke up this morning, she was talking a little bit out of her head, also complained of little bit of dizziness, could not recall appropriately the names of some family members.  Family reports that she repeatedly when asked where her grandchildren were, she kept saying kids were down practicing their Christmas play and thinks like that, which did not make sense for her and was unusual for her.  The only other complaint she had was of dizziness.  Her granddaughter who is also a nurse checked her blood pressure and found that her systolic blood pressure was 90 with a diastolic of 40, normally her blood pressure runs in the 130s.  The patient denies any cough, congestion, shortness of breath, chest pain, palpitations, nausea, vomiting, diarrhea, dysuria, or urinary frequency. She denies any changes in her medicine.  She reports that she had been getting around doing her activities of daily living and sometimes the last two days ago also did her laundry and hung all her clothes on the string line out in the hot sun.  The patient reports sitting outside; however, says that it was not too hot while she was  outside.  PAST MEDICAL HISTORY:  Significant for, 1. Hypertension. 2. Dyslipidemia. 3. History of vertebral compression fracture. 4. History of chronic constipation. 5. History of total abdominal hysterectomy. 6. History of right drop foot.  MEDICATIONS: 1. Fosamax 70 mg weekly. 2. Maxzide 75/50 one tablet daily. 3. Metoprolol tartrate 50 mg b.i.d. 4. Potassium chloride 20 mg daily. 5. Zocor 40 mg daily.  ALLERGIES:  No known drug allergies.  SOCIAL HISTORY:  She lives at home with her daughter, is independent in all ADLs.  Denies any history of alcohol or tobacco use.  FAMILY HISTORY:  Significant for heart disease in both parents.  REVIEW OF SYSTEMS:  Negative except per HPI.  PHYSICAL EXAMINATION:  VITAL SIGNS:  Temperature is 99.9, pulse is 103, blood pressure 117/63, respirations 18, satting 96% on 2 L. GENERAL:  This is an elderly Caucasian female, lying in the stretcher, in no acute distress. HEENT: Pupils are round and reactive to light.  Extraocular movements intact. NECK:  No JVD or lymphadenopathy. CARDIOVASCULAR SYSTEM:  S1 and S2, regular rate and rhythm. LUNGS:  Clear to auscultation bilaterally. ABDOMEN:  Soft, nontender with positive bowel sounds.  No organomegaly. EXTREMITIES:  No edema, clubbing, or cyanosis.  She has a drop foot and wears a brace on her right foot. NEURO:  Alert, awake, oriented to self, place, and person and partly to time.  Cranial nerves II through XII intact.  Motor strength 5/5 in all extremities.  Sensations in all extremities intact.  No dysdiadochokinesia.  No nystagmus.  Reflexes are 1+ in all extremities. Plantars are withdrawal.  LABORATORY DATA:  Review of laboratory data shows white count of 3.5, hemoglobin 15, platelets 115.  Chemistries, sodium 136, potassium 2.9, chloride 97, bicarb 28, BUN 15, creatinine 1.2.  Urine is turbid with small leukocytes, moderate blood, and 0-2 wbc's with mucous. Chest x-ray shows mild  cardiomegaly and probable COPD.  No active lung disease.  CT of the head shows no acute intracranial abnormality, mild chronic small vessel disease.  ASSESSMENT:  Wendy Krueger is a 77 year old female with nonspecific constellation of symptoms most significant for, 1. Dizziness and weakness. 2. Transient hypotension. 3. Possible urinary tract infection. 4. Dehydration. 5. History of hypertension.  PLAN:  We will admit her to a telemetry floor today.  I suspect her symptoms were secondary to dehydration since her mentation as well as blood pressure has improved with rehydration.  We will continue IV fluids.  We will hold her Maxzide.  We will check a 2-D echo and cycle enzymes.  In addition, there was also possibility of urinary tract infection since she does have turbid urine with positive leukocytes, hence we will start her on IV ceftriaxone and check urine cultures. If weakness and mentation does not continue to improve, as it has in the last 5-6 hours, we will consider MRI of brain to rule out posterior circulation stroke, which based on current assessment seems highly unlikely.  Further management as condition evolves.     Zannie Cove, MD     PJ/MEDQ  D:  01/05/2011  T:  01/05/2011  Job:  [id number]  cc:   Marne A. Tower, MD  Electronically Signed by Zannie Cove  on 01/05/2011 11:91:47 PM

## 2011-01-06 ENCOUNTER — Inpatient Hospital Stay (HOSPITAL_COMMUNITY): Payer: Medicare Other

## 2011-01-06 DIAGNOSIS — I517 Cardiomegaly: Secondary | ICD-10-CM

## 2011-01-06 LAB — CBC
HCT: 38.1 % (ref 36.0–46.0)
Hemoglobin: 13.2 g/dL (ref 12.0–15.0)
MCV: 87.4 fL (ref 78.0–100.0)
WBC: 2.6 10*3/uL — ABNORMAL LOW (ref 4.0–10.5)

## 2011-01-06 LAB — URINE CULTURE: Culture  Setup Time: 201207071727

## 2011-01-06 LAB — LIPID PANEL
Cholesterol: 138 mg/dL (ref 0–200)
Triglycerides: 161 mg/dL — ABNORMAL HIGH (ref <150)
VLDL: 32 mg/dL (ref 0–40)

## 2011-01-06 LAB — BASIC METABOLIC PANEL
BUN: 17 mg/dL (ref 6–23)
CO2: 27 mEq/L (ref 19–32)
Chloride: 99 mEq/L (ref 96–112)
Creatinine, Ser: 0.97 mg/dL (ref 0.50–1.10)
Glucose, Bld: 100 mg/dL — ABNORMAL HIGH (ref 70–99)
Potassium: 2.9 mEq/L — ABNORMAL LOW (ref 3.5–5.1)

## 2011-01-06 LAB — CARDIAC PANEL(CRET KIN+CKTOT+MB+TROPI)
Relative Index: INVALID (ref 0.0–2.5)
Total CK: 90 U/L (ref 7–177)

## 2011-01-06 LAB — POTASSIUM: Potassium: 3.6 mEq/L (ref 3.5–5.1)

## 2011-01-07 DIAGNOSIS — D61818 Other pancytopenia: Secondary | ICD-10-CM

## 2011-01-07 DIAGNOSIS — R509 Fever, unspecified: Secondary | ICD-10-CM

## 2011-01-07 LAB — BASIC METABOLIC PANEL
CO2: 24 mEq/L (ref 19–32)
Calcium: 8.1 mg/dL — ABNORMAL LOW (ref 8.4–10.5)
Creatinine, Ser: 0.87 mg/dL (ref 0.50–1.10)
Glucose, Bld: 123 mg/dL — ABNORMAL HIGH (ref 70–99)

## 2011-01-07 LAB — COMPREHENSIVE METABOLIC PANEL
ALT: 51 U/L — ABNORMAL HIGH (ref 0–35)
AST: 89 U/L — ABNORMAL HIGH (ref 0–37)
Albumin: 2.7 g/dL — ABNORMAL LOW (ref 3.5–5.2)
Alkaline Phosphatase: 93 U/L (ref 39–117)
Chloride: 102 mEq/L (ref 96–112)
Potassium: 3.7 mEq/L (ref 3.5–5.1)
Sodium: 135 mEq/L (ref 135–145)
Total Bilirubin: 0.5 mg/dL (ref 0.3–1.2)
Total Protein: 5.3 g/dL — ABNORMAL LOW (ref 6.0–8.3)

## 2011-01-07 LAB — VITAMIN B12: Vitamin B-12: 218 pg/mL (ref 211–911)

## 2011-01-07 LAB — CBC
Hemoglobin: 11.5 g/dL — ABNORMAL LOW (ref 12.0–15.0)
MCH: 29.8 pg (ref 26.0–34.0)
MCHC: 34.1 g/dL (ref 30.0–36.0)
MCV: 87.3 fL (ref 78.0–100.0)
Platelets: 54 10*3/uL — ABNORMAL LOW (ref 150–400)
RBC: 3.86 MIL/uL — ABNORMAL LOW (ref 3.87–5.11)

## 2011-01-07 LAB — RETICULOCYTES
RBC.: 3.62 MIL/uL — ABNORMAL LOW (ref 3.87–5.11)
Retic Ct Pct: 0.8 % (ref 0.4–3.1)

## 2011-01-07 LAB — DIFFERENTIAL
Eosinophils Absolute: 0 10*3/uL (ref 0.0–0.7)
Eosinophils Relative: 0 % (ref 0–5)
Lymphocytes Relative: 20 % (ref 12–46)
Lymphs Abs: 0.2 10*3/uL — ABNORMAL LOW (ref 0.7–4.0)
Monocytes Relative: 8 % (ref 3–12)
Myelocytes: 0 %
Neutro Abs: 0.8 10*3/uL — ABNORMAL LOW (ref 1.7–7.7)
Neutrophils Relative %: 72 % (ref 43–77)
nRBC: 0 /100 WBC

## 2011-01-07 LAB — C-REACTIVE PROTEIN: CRP: 6.2 mg/dL — ABNORMAL HIGH (ref <0.6)

## 2011-01-07 LAB — HEPATIC FUNCTION PANEL
Albumin: 2.6 g/dL — ABNORMAL LOW (ref 3.5–5.2)
Total Protein: 5.2 g/dL — ABNORMAL LOW (ref 6.0–8.3)

## 2011-01-07 LAB — FOLATE: Folate: >20 ng/mL

## 2011-01-07 LAB — DIC (DISSEMINATED INTRAVASCULAR COAGULATION)PANEL
D-Dimer, Quant: 5.46 ug/mL-FEU — ABNORMAL HIGH (ref 0.00–0.48)
Platelets: 49 10*3/uL — ABNORMAL LOW (ref 150–400)

## 2011-01-07 LAB — IRON AND TIBC: UIBC: 160 ug/dL

## 2011-01-07 LAB — SEDIMENTATION RATE: Sed Rate: 13 mm/hr (ref 0–22)

## 2011-01-07 LAB — TECHNOLOGIST SMEAR REVIEW: Path Review: INCREASED

## 2011-01-07 LAB — FERRITIN: Ferritin: 1303 ng/mL — ABNORMAL HIGH (ref 10–291)

## 2011-01-07 LAB — LACTATE DEHYDROGENASE: LDH: 370 U/L — ABNORMAL HIGH (ref 94–250)

## 2011-01-08 ENCOUNTER — Inpatient Hospital Stay (HOSPITAL_COMMUNITY): Payer: Medicare Other

## 2011-01-08 DIAGNOSIS — R5381 Other malaise: Secondary | ICD-10-CM

## 2011-01-08 DIAGNOSIS — H8110 Benign paroxysmal vertigo, unspecified ear: Secondary | ICD-10-CM

## 2011-01-08 LAB — IGG, IGA, IGM: IgM, Serum: 27 mg/dL — ABNORMAL LOW (ref 52–322)

## 2011-01-08 LAB — BASIC METABOLIC PANEL
BUN: 15 mg/dL (ref 6–23)
CO2: 22 mEq/L (ref 19–32)
Calcium: 7.6 mg/dL — ABNORMAL LOW (ref 8.4–10.5)
Glucose, Bld: 101 mg/dL — ABNORMAL HIGH (ref 70–99)
Sodium: 133 mEq/L — ABNORMAL LOW (ref 135–145)

## 2011-01-08 LAB — CBC
HCT: 30.1 % — ABNORMAL LOW (ref 36.0–46.0)
Hemoglobin: 10.4 g/dL — ABNORMAL LOW (ref 12.0–15.0)
MCH: 29.9 pg (ref 26.0–34.0)
MCV: 86.5 fL (ref 78.0–100.0)
RBC: 3.48 MIL/uL — ABNORMAL LOW (ref 3.87–5.11)

## 2011-01-08 LAB — URINALYSIS, MICROSCOPIC ONLY
Glucose, UA: NEGATIVE mg/dL
Hgb urine dipstick: NEGATIVE
Specific Gravity, Urine: 1.018 (ref 1.005–1.030)

## 2011-01-08 MED ORDER — IOHEXOL 300 MG/ML  SOLN
100.0000 mL | Freq: Once | INTRAMUSCULAR | Status: AC | PRN
  Administered 2011-01-08: 100 mL via INTRAVENOUS

## 2011-01-09 ENCOUNTER — Inpatient Hospital Stay (HOSPITAL_COMMUNITY): Payer: Medicare Other

## 2011-01-09 DIAGNOSIS — A779 Spotted fever, unspecified: Secondary | ICD-10-CM

## 2011-01-09 LAB — BASIC METABOLIC PANEL
CO2: 23 mEq/L (ref 19–32)
Chloride: 105 mEq/L (ref 96–112)
Glucose, Bld: 93 mg/dL (ref 70–99)
Potassium: 3.5 mEq/L (ref 3.5–5.1)
Sodium: 134 mEq/L — ABNORMAL LOW (ref 135–145)

## 2011-01-09 LAB — URINE CULTURE
Culture  Setup Time: 201207101349
Culture: NO GROWTH

## 2011-01-09 LAB — DIFFERENTIAL
Blasts: 0 %
Eosinophils Absolute: 0 10*3/uL (ref 0.0–0.7)
Eosinophils Relative: 0 % (ref 0–5)
Metamyelocytes Relative: 0 %
Myelocytes: 0 %
nRBC: 0 /100 WBC

## 2011-01-09 LAB — CBC
MCV: 86.5 fL (ref 78.0–100.0)
Platelets: 32 10*3/uL — ABNORMAL LOW (ref 150–400)
RBC: 3.4 MIL/uL — ABNORMAL LOW (ref 3.87–5.11)
WBC: 1.8 10*3/uL — ABNORMAL LOW (ref 4.0–10.5)

## 2011-01-10 DIAGNOSIS — A779 Spotted fever, unspecified: Secondary | ICD-10-CM

## 2011-01-10 LAB — DIFFERENTIAL
Eosinophils Relative: 2 % (ref 0–5)
Lymphocytes Relative: 40 % (ref 12–46)
Lymphs Abs: 1 10*3/uL (ref 0.7–4.0)
Monocytes Relative: 7 % (ref 3–12)

## 2011-01-10 LAB — HEPARIN INDUCED THROMBOCYTOPENIA PNL
Heparin Induced Plt Ab: NEGATIVE
UFH High Dose UFH H: 0 % Release
UFH Low Dose 0.1 IU/mL: 0 % Release
UFH Low Dose 0.5 IU/mL: 0 % Release

## 2011-01-10 LAB — CBC
MCH: 29.6 pg (ref 26.0–34.0)
MCHC: 34.5 g/dL (ref 30.0–36.0)
Platelets: 48 10*3/uL — ABNORMAL LOW (ref 150–400)
RDW: 13.7 % (ref 11.5–15.5)

## 2011-01-10 LAB — EHRLICHIA ANTIBODY PANEL
E chaffeensis (HGE) Ab, IgG: NEGATIVE
E chaffeensis (HGE) Ab, IgM: NEGATIVE

## 2011-01-11 ENCOUNTER — Encounter: Payer: Self-pay | Admitting: Family Medicine

## 2011-01-11 LAB — DIFFERENTIAL
Basophils Absolute: 0.1 10*3/uL (ref 0.0–0.1)
Basophils Relative: 2 % — ABNORMAL HIGH (ref 0–1)
Monocytes Absolute: 0.4 10*3/uL (ref 0.1–1.0)
Neutro Abs: 1.5 10*3/uL — ABNORMAL LOW (ref 1.7–7.7)
Neutrophils Relative %: 47 % (ref 43–77)

## 2011-01-11 LAB — BASIC METABOLIC PANEL
Chloride: 103 mEq/L (ref 96–112)
GFR calc Af Amer: >60 mL/min (ref >60)
GFR calc non Af Amer: >60 mL/min (ref >60)
Glucose, Bld: 98 mg/dL (ref 70–99)
Potassium: 3.2 mEq/L — ABNORMAL LOW (ref 3.5–5.1)
Sodium: 137 mEq/L (ref 135–145)

## 2011-01-11 LAB — CBC
Hemoglobin: 11.1 g/dL — ABNORMAL LOW (ref 12.0–15.0)
MCHC: 35.1 g/dL (ref 30.0–36.0)
WBC: 3.2 10*3/uL — ABNORMAL LOW (ref 4.0–10.5)

## 2011-01-13 LAB — CULTURE, BLOOD (ROUTINE X 2)
Culture  Setup Time: 201207092315
Culture: NO GROWTH

## 2011-01-14 ENCOUNTER — Other Ambulatory Visit: Payer: Self-pay | Admitting: Family Medicine

## 2011-01-14 ENCOUNTER — Other Ambulatory Visit (INDEPENDENT_AMBULATORY_CARE_PROVIDER_SITE_OTHER): Payer: Medicare Other | Admitting: Family Medicine

## 2011-01-14 ENCOUNTER — Telehealth: Payer: Self-pay | Admitting: *Deleted

## 2011-01-14 DIAGNOSIS — E876 Hypokalemia: Secondary | ICD-10-CM

## 2011-01-14 DIAGNOSIS — R739 Hyperglycemia, unspecified: Secondary | ICD-10-CM

## 2011-01-14 NOTE — Telephone Encounter (Signed)
Patient's daughter, Steward Drone notified as instructed by telephone. Lab appt scheduled as instructed 01/14/11 at 3pm.Pt already has appt scheduled 01/21/11 to see Dr Milinda Antis.

## 2011-01-14 NOTE — Telephone Encounter (Signed)
From what I can see (I do not have the d/c summary yet -- hope will get that soon....) but I did get d/c meds Her K was moderately low in hospital and they discharged her on higher dose  I recommend coming in for a K check to see if she needs more since that can cause some bad cramping  Please schedule bmp for hypokalemia whenever she can come in  Keep me updated -thanks

## 2011-01-14 NOTE — Telephone Encounter (Signed)
Patient was admitted to [facility] on 01-05-11 and was discharged on 01-11-11. She was being treated for a tick bite and a uti. Daughter says that they changed some of her meds and added some. Daughter says that since then mom has been having terrible cramping in her lower legs and has not slept at all the past 2 nights. It only hurts at night. Daughter is asking if you could review her hospital notes and see if any of the med changes could be causing these leg cramps.

## 2011-01-15 ENCOUNTER — Telehealth: Payer: Self-pay | Admitting: *Deleted

## 2011-01-15 LAB — BASIC METABOLIC PANEL
Calcium: 9.6 mg/dL (ref 8.4–10.5)
Creatinine, Ser: 0.8 mg/dL (ref 0.4–1.2)

## 2011-01-15 NOTE — Telephone Encounter (Signed)
K was fine - not low at all  Until next visit hold her zocor and her fosamax - those are not new meds but I wonder if they could cause pain Will see how she is doing at follow up

## 2011-01-15 NOTE — Telephone Encounter (Signed)
Daughter is asking if you could review her labs for her potassium. She is still having problems with her legs. Please advise.

## 2011-01-15 NOTE — Telephone Encounter (Signed)
Left message for pt's daughter Steward Drone to call back.

## 2011-01-16 ENCOUNTER — Ambulatory Visit (INDEPENDENT_AMBULATORY_CARE_PROVIDER_SITE_OTHER): Payer: Medicare Other | Admitting: Family Medicine

## 2011-01-16 ENCOUNTER — Encounter: Payer: Self-pay | Admitting: Family Medicine

## 2011-01-16 VITALS — BP 132/78 | HR 76 | Temp 98.4°F | Wt 146.0 lb

## 2011-01-16 DIAGNOSIS — R3989 Other symptoms and signs involving the genitourinary system: Secondary | ICD-10-CM

## 2011-01-16 DIAGNOSIS — M79609 Pain in unspecified limb: Secondary | ICD-10-CM

## 2011-01-16 DIAGNOSIS — R300 Dysuria: Secondary | ICD-10-CM

## 2011-01-16 DIAGNOSIS — D696 Thrombocytopenia, unspecified: Secondary | ICD-10-CM

## 2011-01-16 DIAGNOSIS — M79605 Pain in left leg: Secondary | ICD-10-CM

## 2011-01-16 DIAGNOSIS — R39198 Other difficulties with micturition: Secondary | ICD-10-CM

## 2011-01-16 DIAGNOSIS — R7401 Elevation of levels of liver transaminase levels: Secondary | ICD-10-CM

## 2011-01-16 DIAGNOSIS — M79604 Pain in right leg: Secondary | ICD-10-CM

## 2011-01-16 LAB — POCT URINALYSIS DIPSTICK
Leukocytes, UA: NEGATIVE
Nitrite, UA: NEGATIVE
Protein, UA: NEGATIVE
Urobilinogen, UA: 0.2

## 2011-01-16 NOTE — Assessment & Plan Note (Signed)
Recheck cbc today, likely due to tick bite.  No bleeding.

## 2011-01-16 NOTE — Assessment & Plan Note (Signed)
Calf not ttp x2 and normal inspection other than leg brace.  Would check K again, take K supplement at night.  Hold statin in meantime and then f/u with PMD.

## 2011-01-16 NOTE — Discharge Summary (Signed)
NAMEMARKETTA, VALADEZ                ACCOUNT NO.:  [id number]  MEDICAL RECORD NO.:  [id number]  LOCATION:  4733                         FACILITY:  [facility]  PHYSICIAN:  Thad Ranger, MD       DATE OF BIRTH:  Jan 01, 1930  DATE OF ADMISSION:  01/05/2011 DATE OF DISCHARGE:  01/11/2011                        DISCHARGE SUMMARY - REFERRING   PRIMARY CARE PHYSICIAN:  Marne A. Tower, MD  DISCHARGE DIAGNOSES: 1. Benign positional paroxysmal vertigo, improved. 2. Tick-borne illness. 3. Neck acute encephalopathy likely secondary to tick-borne     illness/fever, resolved. 4. Pancytopenia with neutropenia and thrombocytopenia likely due to     tick-borne illness. 5. Transaminitis, improving likely secondary to tick-borne illness. 6. Borderline B12 deficiency. 7. Osteoporosis. 8. Hypertension.  CONSULTATIONS: 1. Hematology, Dr. Arline Asp. 2. Infectious Disease, Dr. Orvan Falconer.  DISCHARGE MEDICATIONS: 1. Vitamin B12 1 tablet p.o. daily.2. Doxycycline 100 mg p.o. b.i.d. for 10 days to complete the course     for 14 days. 3. Multivitamin 1 tablet p.o. daily. 4. Meclizine 25 mg p.o. b.i.d. 5. Fosamax 70 mg p.o. on Sunday. 6. Metoprolol 50 mg p.o. b.i.d. 7. Potassium 20 mEq p.o. daily. 8. Zocor 40 mg daily.  BRIEF HISTORY OF PRESENT ILLNESS:  At the time of admission, Ms. Prim is a 77 year old female with past history of hypertension, dyslipidemia, was brought to the emergency room as the patient was not acting right per family.  The patient's family reported that she was tired all day and laid in bed with exception of getting up to eat her lunch and supper.  Next morning, she woke up and was confused still with some dizziness.  The patient's granddaughter noticed her blood pressure was low in 90s to the diastolic of 40.  The patient was brought to the emergency room.  RADIOLOGICAL DATA:  CT head without contrast on July 7 showed no acute intracranial abnormality, mild chronic small vessel  disease.  Chest x- ray two-view in July 7; 1. Mild cardiomegaly and probable COPD. 2. No active lung disease. 3. Mid and thoracic vertebral body compression fractures of     indeterminate age known to the patient.  MRI without contrast on     July 8, no evidence for acute or subacute infarction to with trophy     and extensive white matter disease likely reflects sequelae of     chronic microvascular ischemic pain for age. 4. Fusiform dilatation of the distal right internal carotid artery.  CT angiogram of the chest on January 08, 2000; 1. No evidence of acute pulmonary embolism. 2. Small layering pleural effusions with contra and dependent     atelectasis, no definitive pneumonia, indeterminate central low     density liver lesions 38  x 21 mm.  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  Ultrasound of     the abdomen on July 11 showed 5.5 cm lobulated hepatic lesion with     imaging rectosigmoid suggestive of hemangioma.  PERTINENT LAB DIAGNOSTIC DATA:  UA at the time of admission showed small leukocytes, negative nitrites.  Her troponins were negative for any acute ACS.  CBC had shown white count  of 3.5 with a hemoglobin of 14.5 and platelets of 115.  During the hospitalization, the patient was neutropenia with thrombocytopenia and pancytopenia.  On January 08, 2011, the patient's white count was critically low at 1.0 with a hemoglobin of 10.4, hematocrit 30.1, and platelets of 40.  Platelets were lowest at 32.  D-dimer showed 15.46, INR 1.14.  Hepatic function test, AST 86 ALT 48, albumin 2.6, and ESR of 13.  Urine culture showed no growth, [facility] spotted fever and ehrlichia were negative.  HIT antibodies were negative.  CBC at the time of discharge had shown improved white count at 3.2, hemoglobin 11.1, hematocrit 31.6, and platelets improved to 77.  HOSPITAL COURSE:  Ms. Wenk is a 77 year old female who was admitted with  dizziness and weakness.  Initially was thought to have maybe UTI. 1. Dizziness with generalized debility and acute encephalopathy.  The     patient was admitted to the Medical Service.  Initially, she was     treated with Cipro with Rocephin.  The patient was seen by Physical     Therapy and did have some on vertigo as well.  The patient was     started on meclizine. 2. Tick-borne illness/tick-borne fever.  The patient during the     hospitalization was noted continued to spike fevers.  Urine culture     and blood cultures remain negative till date.  Labs revealed     neutropenia with pancytopenia and thrombocytopenia with elevated     LFTs.  The patient then was placed on neutropenic precautions with     cefepime and complete hematology profile was obtained.  During the     hospitalization, the patient reported that she did have a tick bite     a week before the admission, hence she was started on doxycycline.     Infectious Disease consultation was also obtained and the patient     was followed by Dr. Orvan Falconer and from Hematology by Dr. Arline Asp.     At this point, the patient is feeling remarkably well and the     counts have been improving.  The patient had multiple PT/OT     evaluation and she was also evaluated by inpatient rehab.  At the     time of the dictation, she is doing well.  Hence does not need     inpatient rehab at this point.  The patient will be going with home     PT/OT.  She should have repeat CBC and complete metabolic panel     including liver function tests at Dr. Royden Purl office within next     appointment in 7-10 days.  PHYSICAL EXAMINATION:  VITAL SIGNS:  At the time of discharge, temperature 97.7, pulse 80, respirations 18, blood pressure 109/67, O2 sat 100% of 2 L. GENERAL:  The patient is alert, awake and oriented x3, not in any acute distress. HEENT:  Sclerae are icteric.  Conjunctivae are pink.  Pupils reactive to light and accommodation.  EOMI. NECK:   Supple.  No lymphopathy. CVS:  Regular rate and rhythm. CHEST:  Clear to auscultation bilaterally. ABDOMEN:  Soft, nontender, nondistended.  Normal bowel sounds. EXTREMITIES:  No cyanosis, clubbing, or edema noted in upper and lower bilaterally.  Discharge followup with Dr. Roxy Manns within 7-10 days of hospitalization post discharge.  The patient needs to have CBC and complete metabolic panel including liver function tests checked at Dr. Royden Purl office  Discharge time 35 minutes.  Thad Ranger, MD     RR/MEDQ  D:  01/11/2011  T:  01/11/2011  Job:  [id number]  cc:   Marne A. Milinda Antis, MD Samul Dada, M.D.  Electronically Signed by Andres Labrum RAI  on 01/16/2011 05:57:30 PM

## 2011-01-16 NOTE — Telephone Encounter (Signed)
Patient's daughter, Steward Drone notified as instructed by telephone. Pt has appt to see Dr Milinda Antis on 01/21/11. Steward Drone wants pt seen today due to constipation and not voiding a lot. Pt recently had UTI. Pt to see Dr Para March today at 2:30pm.

## 2011-01-16 NOTE — Assessment & Plan Note (Signed)
No change in abx, check ucx.  This may be related to coming off HCTZ/triamterene, with dec in UOP in AM.

## 2011-01-16 NOTE — Progress Notes (Signed)
She went to ER 01/05/11, was treated for UTI, low K and dehydration until discharged 01/11/11.  Was feeling better upon leaving [facility].  There was a tick bite before the hospital stay, treated with doxy in meantime- started in the hospital.  This was the likely source of transaminitis, and low platelets. Since the weekend, leg pain is worse at night-this is a new sx.  Minimal leg pain during the day.  B calf pain at night, pain from knees distally.  No trauma.  She has PT at home, since the discharge.    She was prev treated with B12 shots, then transitioned to oral meds.    She has been taken off fosamax, simvastatin, and triamterene/HCTZ as of today.  She has been taking K supplement at lunch and will start this at night as of tonight.    She has been having some trouble with urination in AM, trouble with initiating stream. Stream is okay later in the day.  Occ burning with urination but not consistent.  BMs- had to use some laxatives and suppository.  Hasn't had totally regular BMs since coming home.  She's now on 2 stool softeners a day.  They're trying not to overcorrect and induce diarrhea.    Wears a brace for R foot drop, baseline.  No bleeding/bruising  Meds, vitals, and allergies reviewed.   ROS: See HPI.  Otherwise, noncontributory.  nad ncat Op wnl Neck supple, no LA rrr ctab Ext w/o edema R ankle in brace No rash

## 2011-01-16 NOTE — Assessment & Plan Note (Signed)
Recheck cmet today, likely due to tick bite.  No jaundice.

## 2011-01-16 NOTE — Patient Instructions (Signed)
Hold the triamterene/fosamax/vit D/simvastatin.  Take the potassium at night.  Keep your appointment with Dr. Milinda Antis and let me know if the symptoms increase in the meantime.  We'll contact you with your lab report.

## 2011-01-17 LAB — CBC WITH DIFFERENTIAL/PLATELET
Eosinophils Relative: 1.3 % (ref 0.0–5.0)
HCT: 34.8 % — ABNORMAL LOW (ref 36.0–46.0)
Lymphs Abs: 1.9 10*3/uL (ref 0.7–4.0)
MCV: 90.2 fl (ref 78.0–100.0)
Monocytes Absolute: 0.6 10*3/uL (ref 0.1–1.0)
Platelets: 395 10*3/uL (ref 150.0–400.0)
RDW: 14.2 % (ref 11.5–14.6)
WBC: 7.7 10*3/uL (ref 4.5–10.5)

## 2011-01-17 LAB — COMPREHENSIVE METABOLIC PANEL
ALT: 62 U/L — ABNORMAL HIGH (ref 0–35)
Alkaline Phosphatase: 111 U/L (ref 39–117)
Sodium: 137 mEq/L (ref 135–145)
Total Bilirubin: 0.5 mg/dL (ref 0.3–1.2)
Total Protein: 7.3 g/dL (ref 6.0–8.3)

## 2011-01-18 LAB — URINE CULTURE

## 2011-01-20 NOTE — Consult Note (Signed)
[name], Krueger                ACCOUNT NO.:  [id number]  MEDICAL RECORD NO.:  [id number]  LOCATION:  4733                         FACILITY:  [facility]  PHYSICIAN:  Samul Dada, M.D.DATE OF BIRTH:  13-Apr-1930  DATE OF CONSULTATION:  01/07/2011 DATE OF DISCHARGE:                                CONSULTATION   HISTORY:  Wendy Krueger is a 77 year old white widowed female whom I am asked to see in consultation by Dr. Isidoro Donning for evaluation of a rapidly evolving pancytopenia.  This patient was admitted to the hospital on January 05, 2011, with confusion and fever, who was found to be a little bit hypotensive.  The patient apparently was not aware of her fever, but on the day of admission had a temperature of 101.5.  She continues to be febrile and in fact earlier today her temperature was 102.6, that being at 1345 hours.  The patient apparently had a ride during the hospital. Of note, is the fact that she has no prior history of having low blood counts.  She comes from a very large family and her children are very attentive and go with her to her doctor's appointments.  She has not been hospitalized I believe since 1977.  She sees Dr. Milinda Antis every 6 months and usually has blood work drawn.  On admission, the patient's blood counts were mildly abnormal with a white count of 3.5, platelets of 115,000, hemoglobin of 14.5, hematocrit 41.8, and differential that looked normal with 84% neutrophils, only 9% lymph, 6% monocytes and an ANC of 2.9.  On January 06, 2011, the white count had fallen to 2.6, platelets of 77,000.  Hemoglobin/hematocrit were fairly stable 13.2 and 38.1.  We do not have a differential.  Today, July 9th, the white count is now 1.1 with an ANC of 0.8, platelets have fallen to 54,000, hemoglobin 11.5, hematocrit 33.7.  Despite these findings, the patient states that she feels better that she did when she is on admission.  She is looking quite good, relatively asymptomatic, very  talkative, appropriate.  I saw the patient in the presence of her family.  There were no new medicines that would account for these changes and as stated, the patient has no prior history.  We are trying to obtain the records from Dr. Milinda Antis to see if there was any preexisting mild cytopenias.  PAST MEDICAL HISTORY:  Significant for hypertension, dyslipidemia, chronic constipation, and history of vertebral compression fractures and a right foot drop that was being seen by Dr. Clarisse Gouge over the past few months.  The patient has undergone physical therapy.  She does use a cane and has an L-brace for her foot drop on the right foot.  Her only surgery apparently was a hysterectomy for fibroids.  I suspect the patient may have had her ovaries taken out at that time.  She is really had no other surgeries.  She has had no major injuries, although she did have a fracture of her right wrist about 10 years ago.  As stated, she did not been hospitalized for any major injuries.  No history of blood transfusions.  No history of allergies.  MEDICATIONS ON ADMISSION: 1.  Fosamax 70 mg weekly. 2. Maxzide 75/50 one tablet daily. 3. Metoprolol tartrate 50 mg b.i.d. 4. Potassium chloride 20 mEq daily. 5. Zocor 40 mg daily. 6. The patient apparently had difficulty tolerating calcium and     vitamin D.  FAMILY HISTORY:  Significant for the presence of cancer in 3 brothers, 2 of whom died, one of throat cancer in his 38s and another in his early 84s of lung cancer.  Both were user of tobacco.  Another brother is alive with prostate cancer.  There is no history of any blood disorders. Parents died in their 39s of heart attacks.  The patient has 5 adult children, 11 grandchildren, 13 great grandchildren, all of whom were apparently in good health.  SOCIAL HISTORY:  The patient denies any use of cigarette or tobacco products or alcohol.  She was born in [place], basically has lived at the same  location all of her life.  She has 11th grade education.  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  She had been married for 42 years, was widowed in 1996.  The patient lives in her own home with her daughter, Kendal Hymen and Bonnie's 2 children.  They live in [place].  The patient sounds like she is fairly active around the home doing housework, cooking, quite independent, but not driving, particularly because of her foot drop.  REVIEW OF SYSTEMS:  The patient sounds like that she was in fairly good health up until just the day or before admission.  She apparently was able to make dinner, but went to bed with her clothes on and then the next day was confused, but it was around Christmas time apparently was confused to family members.  The patient also was having some dizziness and had difficulty standing, although that apparently is cleared.  The patient denies any major neurologic problems other than the foot drop which developed a few months ago and apparently is felt to be related to her compression fractures in her back.  She usually does not fall, I think her last fall was in February.  The patient wears glasses.  Vision and hearing are good.  No sinus problems or hay fever.  There is a history here of some weight loss over the past year or so.  The patient apparently weighed 170 pounds couple of years ago.  The patient has had poor appetite over the past year.  She has diarrhea alternating with constipation.  No blood in the stools.  She has never had a colonoscopy.  No history of liver problems or jaundice.  No cardiac or respiratory illnesses.  She has regular yearly mammograms most recently in February 2012.  No urinary symptomatology.  No hot flashes or bleeding.  She has had a hysterectomy.  No swelling of legs, blood clots, intermittent claudication.  She bruises and that is not a new finding.  No undue bleeding.  No fever or night  sweats in the past. No musculoskeletal pains even and back pains.  No history of rashes, pruritus, or skin cancers and no history of depression.  PHYSICAL EXAMINATION:  VITAL SIGNS:  Latest temperature was 101.5 with a recorded temperature of 102.6 on 1345 hours.  Pulse 76 and regular, respirations regular and unlabored, blood pressure 103/65.  O2 saturation on 2 liters per minute was 100%. HEENT:  No scleral icterus.  Pupillary and extraocular movements are normal.  Mouth and pharynx, the patient has upper and lower  dentures. No thrush or ulcers. NECK:  Without adenopathy or thyroid enlargement. LUNGS:  Anteriorly were clear. CARDIAC:  Regular rhythm with systolic ejection murmur.  The patient is on oxygen. BREASTS:  Without masses.  No axillary adenopathy. ABDOMEN:  Obese, nontender with no organomegaly or masses palpable. EXTREMITIES:  No peripheral edema or clubbing.  The patient does have a dorsi flexor weakness on the right side. NEUROLOGIC:  Grossly normal.  The patient is quite conversant and appropriate.  I did not do formal mental status testing.  She moves all extremities.  LABORATORY DATA:  CBCs are as described above.  We do not have a CMET. Her vitamin B12 level from July 7 was 223 with normal being 211-911. TSH was 0.746.  BMET from today was essentially normal with a BUN of 15, creatinine 0.87, calcium 8.1, glucose 123.  A DIC panel with a platelet count of 49,000.  Pro time was 14.8 with an INR of 1.14, PTT of 34 both of which are normal.  Fibrinogen was 287 which is slightly low but in the normal range of 204-475.  We are awaiting the D-dimer and peripheral smear for schistocytes, although I did not see any on my exam of the peripheral smear.  Inspection of the peripheral smear did disclose large platelets suggesting peripheral destruction or utilization.  There would not be many granulocytes to see, however, those that I did see, I was concerned about  hypogranulation and some dysphotic forms which raises the possibility of myelodysplastic syndrome.  It will be interesting to see if prior CBCs showed some mild cytopenias.  I did not see any erythroid or myeloid precursors.  We have a CT scan of the chest to review from January 05, 2011, that showed mild cardiomegaly, possible COPD, and compression fractures of the mid and lower thoracic spine. Apparently, the patient has had this for some time.  There is an MRI of the head without IV contrast which shows no acute changes.  The patient has some atrophy and extensive white matter disease felt to be the sequelae of chronic microvascular ischemia.  IMPRESSION AND PLAN:  Despite the fact that this patient has fairly or profound leukopenia, neutropenia and thrombocytopenia, clinically she looks fairly well.  She is febrile.  She has undergone cultures and is being placed on broad-spectrum antibiotics, specifically cefepime.  The patient had been on Rocephin..  I note that the patient was placed on aspirin.  We are going to stop that given the fact that her platelet count is dropping, it is now 54.  I should mention that I did not really see any significant bruising or petechiae on physical exam.  The differential here for this patient's pancytopenia was fairly extensive. I do not see any suspect drugs.  The patient does not have a history of liver disease or hypersplenism.  She does have somewhat low vitamin B12 level that was observed in the past, but the peripheral smear really does not suggest B12 deficiency and her red cell indices are normal. Certainly, the patient could have underlying bone marrow disease.  We are not seeing any abnormal forms to suggest acute leukemia.  The possibility of multiple myeloma, given her osteopenia and compression fractures remains and we checked for serum and urine immunofixation. She could have underlying MDS and drop in counts as a result of infection.   Conversely, she could have an infection viral illness that could be causing her blood counts drop at least transiently.  There is no  history of exposure to unusual organisms such as tics or any foreign travel.  The patient could have autoimmune disease.  We are checking an ANA.  Other tests which have been ordered include retic count, sed rate, C-reactive protein, complete chemistries which we do not have CMET with LDH.  We will continue to follow the patient closely.  If her blood counts failed to improve or some worsened then she may require a bone marrow aspirate and biopsy.  This has been explained to her family.  We can try her on some vitamin B12 injections to see if that helps.  Thank you for this consultation.     Samul Dada, M.D.     DSM/MEDQ  D:  01/07/2011  T:  01/08/2011  Job:  [id number]  cc:   Thad Ranger, MD Audrie Gallus Milinda Antis, MD  Electronically Signed by Kimberlee Nearing M.D. on 01/20/2011 10:09:15 PM

## 2011-01-21 ENCOUNTER — Encounter: Payer: Self-pay | Admitting: Family Medicine

## 2011-01-21 ENCOUNTER — Ambulatory Visit (INDEPENDENT_AMBULATORY_CARE_PROVIDER_SITE_OTHER): Payer: Medicare Other | Admitting: Family Medicine

## 2011-01-21 DIAGNOSIS — E7800 Pure hypercholesterolemia, unspecified: Secondary | ICD-10-CM

## 2011-01-21 DIAGNOSIS — I10 Essential (primary) hypertension: Secondary | ICD-10-CM

## 2011-01-21 DIAGNOSIS — R300 Dysuria: Secondary | ICD-10-CM

## 2011-01-21 DIAGNOSIS — D696 Thrombocytopenia, unspecified: Secondary | ICD-10-CM

## 2011-01-21 NOTE — Patient Instructions (Signed)
Schedule non fasting labs in 1-2 weeks please  Try miralax as directed over the counter for constipation  If legs still hurt- hold your zocor  Continue holding fosamax and fluid pill  Leave urine specimen today and we will update you  Drink lots of fluids  If any urine symptoms - call and let me know  Follow up with me in 6 weeks

## 2011-01-21 NOTE — Assessment & Plan Note (Signed)
Good bp at home off diuretic  Will continue to follow  Labs 1-2 wk

## 2011-01-21 NOTE — Assessment & Plan Note (Signed)
If legs still hurt- adv to hold zocor also and update

## 2011-01-21 NOTE — Assessment & Plan Note (Signed)
Pt no longer has symptoms  Re check urine cx

## 2011-01-21 NOTE — Assessment & Plan Note (Signed)
Disc tylenol use Re check at 1-2 wk labs  Is mild no symptoms

## 2011-01-21 NOTE — Assessment & Plan Note (Signed)
?   Rel to tick bite Resolved at this time Re check 1-2 wk

## 2011-01-21 NOTE — Progress Notes (Signed)
Subjective:    Patient ID: Wendy Krueger, female    DOB: [birth year], 77 y.o.   MRN: [id number]  HPI Here for disc of lab re check and HTN   Is feeling a little better overall  Is sleeping better at night - tylenol pm works really well  Chronic discomfort in her legs  A little swollen - but not bad  Leg pain is improved with K and also held fosamax and   Some constipation On stool softener bid  Took correctol once  Has not taken miralax  Lots of fluids too  Is also drinking can of ensure daily   Now has 24 hour care at home  Was prev not eating well     Was hosp for dehydration/ uti/ and also had a tick bite (on doxy) Had change in transaminases and platelet transiently  Had likely hemangioma on her [place] - need to watch that      ast /alt now 49 and 62  Stable hb 11.7  HTN 148/76--130s/70s-80-s at home with therapist  Off fluid pill  No cp or ha or sob  Wt is down 3 lb   Urine cx still had small amt of enterococcus 7/20  Has been on a lot of antibiotics  No burning / no frequency - etc   Patient Active Problem List  Diagnoses  . UNSPECIFIED VITAMIN D DEFICIENCY  . HYPERCHOLESTEROLEMIA, PURE  . HYPERTENSION  . OSTEOARTHRITIS  . OSTEOPOROSIS  . CONTUSION OF BACK  . Thrombocytopenia  . Dysuria  . Transaminitis  . Leg pain, bilateral   Past Medical History  Diagnosis Date  . Hypertension   . Osteoarthritis   . Osteoporosis   . Hyperlipidemia    Past Surgical History  Procedure Date  . Abdominal hysterectomy     BSO- fibroids  . Wrist fracture surgery 2/01    R arm  . Abi's     normal  . Left foot brace   . Dexa 1/05    osteoporosis   History  Substance Use Topics  . Smoking status: Never Smoker   . Smokeless tobacco: Not on file  . Alcohol Use: No   Family History  Problem Relation Age of Onset  . Heart attack Father   . Hypertension Father   . Heart attack Mother   . Heart attack Father   . Throat cancer Brother    Allergies   Allergen Reactions  . Amlodipine Besylate     REACTION: hives  . Calcitonin ([place])     REACTION: headache/ head pressure  . Vitamin D     REACTION: nausea and vomiting   Current Outpatient Prescriptions on File Prior to Visit  Medication Sig Dispense Refill  . cyanocobalamin 1000 MCG tablet Take 1,000 mcg by mouth daily.       Marland Kitchen docusate sodium (COLACE) 100 MG capsule Take 100 mg by mouth 2 (two) times daily.       . meclizine (ANTIVERT) 25 MG tablet Take 25 mg by mouth 2 (two) times daily.        . metoprolol (LOPRESSOR) 50 MG tablet Take 50 mg by mouth 2 (two) times daily.        . Multiple Vitamin (MULTIVITAMIN) tablet Take 1 tablet by mouth daily.        . potassium chloride (KLOR-CON) 10 MEQ CR tablet 2 tabs by mouth daily       . Calcium Carbonate (CALCIUM 500 PO) Take 1 tablet by mouth 3 (  three) times daily.        . Cholecalciferol (VITAMIN D) 2000 UNITS CAPS Take 2 capsules by mouth daily.        . simvastatin (ZOCOR) 40 MG tablet Take 40 mg by mouth at bedtime.             Review of Systems Review of Systems  Constitutional: Negative for fever, appetite change, fatigue and unexpected weight change.  Eyes: Negative for pain and visual disturbance.  Respiratory: Negative for cough and shortness of breath.   Cardiovascular: Negative.  for cp or sob Gastrointestinal: Negative for nausea, diarrhea and pos for mild constipation Genitourinary: Negative for urgency and frequency.  Skin: Negative for pallor. or rash Neurological: Negative for weakness, light-headedness, numbness and headaches.  Hematological: Negative for adenopathy. Does not bruise/bleed easily.  Psychiatric/Behavioral: Negative for dysphoric mood. The patient is not nervous/anxious.          Objective:   Physical Exam  Constitutional: She appears well-developed and well-nourished. No distress.  HENT:  Head: Normocephalic and atraumatic.  Nose: Nose normal.  Mouth/Throat: Oropharynx is clear and  moist.  Eyes: Conjunctivae and EOM are normal. Pupils are equal, round, and reactive to light. No scleral icterus.  Neck: Normal range of motion. Neck supple. No JVD present. Carotid bruit is not present. No thyromegaly present.  Cardiovascular: Normal rate, regular rhythm, normal heart sounds and intact distal pulses.   No murmur heard. Pulmonary/Chest: Effort normal and breath sounds normal. No respiratory distress. She has no wheezes.  Abdominal: Soft. Bowel sounds are normal. She exhibits no distension and no mass. There is no tenderness.       No renal bruits   Musculoskeletal: Normal range of motion. She exhibits no edema and no tenderness.  Lymphadenopathy:    She has no cervical adenopathy.  Neurological: She is alert. She has normal reflexes. No cranial nerve deficit. Coordination normal.  Skin: Skin is warm and dry. No rash noted. No erythema. No pallor.  Psychiatric: She has a normal mood and affect.          Assessment & Plan:

## 2011-01-23 LAB — URINE CULTURE

## 2011-01-24 ENCOUNTER — Telehealth: Payer: Self-pay

## 2011-01-24 NOTE — Telephone Encounter (Signed)
Message copied by Patience Musca on Thu Jan 24, 2011  3:58 PM ------      Message from: Roxy Manns A      Created: Thu Jan 24, 2011 11:12 AM       Urine cx is inconclusive      How is she feeling? Any urinary symptoms ?

## 2011-01-24 NOTE — Telephone Encounter (Signed)
Patient notified as instructed by telephone. Pt said she is feeling good.Pt is not having any urinary symptoms. Pt said she has been drinking water and if urinary symptoms were to occur she will call Dr Milinda Antis back. Pt has f/u appt with Dr Milinda Antis already scheduled for 03/05/11.

## 2011-01-25 ENCOUNTER — Telehealth: Payer: Self-pay | Admitting: *Deleted

## 2011-01-25 NOTE — Telephone Encounter (Signed)
Continue the miralax as it may take a bit longer to work  Get magnesium citrate over the counter - drink 1/2 to a whole bottle at one time  Update if not improved  If severe abd pain or vomiting - update and go to ER

## 2011-01-25 NOTE — Telephone Encounter (Signed)
Patient's son notified as instructed by telephone. 

## 2011-01-25 NOTE — Telephone Encounter (Signed)
Patient has not had a bowel movement since 7-21. She feels really full and miserable. She has been taking miralax, drinking coffee, and juice. She is asking for other suggestions.

## 2011-02-04 ENCOUNTER — Telehealth: Payer: Self-pay | Admitting: *Deleted

## 2011-02-04 ENCOUNTER — Other Ambulatory Visit (INDEPENDENT_AMBULATORY_CARE_PROVIDER_SITE_OTHER): Payer: Medicare Other | Admitting: Family Medicine

## 2011-02-04 DIAGNOSIS — D696 Thrombocytopenia, unspecified: Secondary | ICD-10-CM

## 2011-02-04 DIAGNOSIS — I10 Essential (primary) hypertension: Secondary | ICD-10-CM

## 2011-02-04 LAB — COMPREHENSIVE METABOLIC PANEL
Albumin: 3.7 g/dL (ref 3.5–5.2)
BUN: 16 mg/dL (ref 6–23)
CO2: 26 mEq/L (ref 19–32)
Calcium: 9 mg/dL (ref 8.4–10.5)
Chloride: 102 mEq/L (ref 96–112)
Glucose, Bld: 82 mg/dL (ref 70–99)
Potassium: 3.7 mEq/L (ref 3.5–5.1)
Total Protein: 6.9 g/dL (ref 6.0–8.3)

## 2011-02-04 LAB — CBC WITH DIFFERENTIAL/PLATELET
Basophils Relative: 0.9 % (ref 0.0–3.0)
Eosinophils Relative: 3.9 % (ref 0.0–5.0)
Lymphocytes Relative: 25.8 % (ref 12.0–46.0)
MCV: 90.1 fl (ref 78.0–100.0)
Monocytes Absolute: 0.5 10*3/uL (ref 0.1–1.0)
Monocytes Relative: 9.9 % (ref 3.0–12.0)
Neutrophils Relative %: 59.5 % (ref 43.0–77.0)
RBC: 3.78 Mil/uL — ABNORMAL LOW (ref 3.87–5.11)
WBC: 4.7 10*3/uL (ref 4.5–10.5)

## 2011-02-04 NOTE — Telephone Encounter (Signed)
Patient's family notified as instructed by telephone. Handicap form left at front desk for pick up.

## 2011-02-04 NOTE — Telephone Encounter (Signed)
Pt is requesting a handicapped sticker form filled out, she did not supply form. Please call when ready.

## 2011-02-04 NOTE — Telephone Encounter (Signed)
Done in IN box 

## 2011-02-05 DIAGNOSIS — Z5189 Encounter for other specified aftercare: Secondary | ICD-10-CM

## 2011-02-05 DIAGNOSIS — R269 Unspecified abnormalities of gait and mobility: Secondary | ICD-10-CM

## 2011-02-05 DIAGNOSIS — A938 Other specified arthropod-borne viral fevers: Secondary | ICD-10-CM

## 2011-02-05 DIAGNOSIS — R42 Dizziness and giddiness: Secondary | ICD-10-CM

## 2011-02-22 ENCOUNTER — Other Ambulatory Visit: Payer: Self-pay | Admitting: *Deleted

## 2011-02-22 MED ORDER — POTASSIUM CHLORIDE 10 MEQ PO TBCR: EXTENDED_RELEASE_TABLET | ORAL | Status: DC

## 2011-03-05 ENCOUNTER — Encounter: Payer: Self-pay | Admitting: Family Medicine

## 2011-03-05 ENCOUNTER — Ambulatory Visit (INDEPENDENT_AMBULATORY_CARE_PROVIDER_SITE_OTHER): Payer: Medicare Other | Admitting: Family Medicine

## 2011-03-05 DIAGNOSIS — M1990 Unspecified osteoarthritis, unspecified site: Secondary | ICD-10-CM

## 2011-03-05 DIAGNOSIS — K5900 Constipation, unspecified: Secondary | ICD-10-CM | POA: Insufficient documentation

## 2011-03-05 DIAGNOSIS — E7800 Pure hypercholesterolemia, unspecified: Secondary | ICD-10-CM

## 2011-03-05 DIAGNOSIS — I10 Essential (primary) hypertension: Secondary | ICD-10-CM

## 2011-03-05 DIAGNOSIS — M79609 Pain in unspecified limb: Secondary | ICD-10-CM

## 2011-03-05 DIAGNOSIS — M79605 Pain in left leg: Secondary | ICD-10-CM

## 2011-03-05 DIAGNOSIS — M810 Age-related osteoporosis without current pathological fracture: Secondary | ICD-10-CM

## 2011-03-05 NOTE — Assessment & Plan Note (Signed)
Ref to ortho for worsened R knee and foot pain and malfitting brace Urged to use walker at all times

## 2011-03-05 NOTE — Assessment & Plan Note (Signed)
Much improved after bid colace and also prune juice rec prep H for ext hemorrhoids

## 2011-03-05 NOTE — Progress Notes (Signed)
Subjective:    Patient ID: Wendy Krueger, female    DOB: [birth year], 77 y.o.   MRN: [id number]  HPI Here for f/u of HTN and hyperlipidemia   bp is down from last visit with 132/84-- was higher  No ha or cp or palpitations or edema  She is on lopressor   Wt is up about 3 lbs -- family really has to make her eat  Not really hungry    On K - her level normalized   Platelets normal  Hb is 11.4 -fairly stable Does not want colonoscopy  Energy level is about the same   Has fallen twice in past 2 weeks-- needs to use her walker now  No rugs on the floor  Has had PT at home  R knee gives way often  ? If wants to see ortho  Brace now started to hurt her leg - supposed to wear on R foot     Holding zocor for leg pain  Also stopped the fosamax  From her family's perspective - holding those pills helps   Was having constipation at last visit  Is giving some stool softeners and more fruits and prune juice  That seems to be working  Either every day or every other day   Patient Active Problem List  Diagnoses  . UNSPECIFIED VITAMIN D DEFICIENCY  . HYPERCHOLESTEROLEMIA, PURE  . HYPERTENSION  . OSTEOARTHRITIS  . OSTEOPOROSIS  . Leg pain, bilateral  . Constipation   Past Medical History  Diagnosis Date  . Hypertension   . Osteoarthritis   . Osteoporosis   . Hyperlipidemia    Past Surgical History  Procedure Date  . Abdominal hysterectomy     BSO- fibroids  . Wrist fracture surgery 2/01    R arm  . Abi's     normal  . Left foot brace   . Dexa 1/05    osteoporosis   History  Substance Use Topics  . Smoking status: Never Smoker   . Smokeless tobacco: Not on file  . Alcohol Use: No   Family History  Problem Relation Age of Onset  . Heart attack Father   . Hypertension Father   . Heart attack Mother   . Heart attack Father   . Throat cancer Brother    Allergies  Allergen Reactions  . Amlodipine Besylate     REACTION: hives  . Calcitonin ([place])      REACTION: headache/ head pressure  . Fosamax     Let pain   . Simvastatin     Let pain   . Vitamin D     REACTION: nausea and vomiting   Current Outpatient Prescriptions on File Prior to Visit  Medication Sig Dispense Refill  . Calcium Carbonate (CALCIUM 500 PO) Take 1 tablet by mouth 3 (three) times daily.        . Cholecalciferol (VITAMIN D) 2000 UNITS CAPS Take 2 capsules by mouth daily.        . cyanocobalamin 1000 MCG tablet Take 1,000 mcg by mouth daily.       . diphenhydramine-acetaminophen (TYLENOL PM) 25-500 MG TABS Take 1 tablet by mouth at bedtime as needed.        . docusate sodium (COLACE) 100 MG capsule Take 100 mg by mouth 2 (two) times daily.       . meclizine (ANTIVERT) 25 MG tablet Take 25 mg by mouth 2 (two) times daily.        . metoprolol (LOPRESSOR)  50 MG tablet Take 50 mg by mouth 2 (two) times daily.        . Multiple Vitamin (MULTIVITAMIN) tablet Take 1 tablet by mouth daily.        . potassium chloride (KLOR-CON) 10 MEQ CR tablet 2 tabs by mouth daily  60 tablet  4        Review of Systems Review of Systems  Constitutional: Negative for fever, appetite change, fatigue and unexpected weight change. (appetite is still poor) Eyes: Negative for pain and visual disturbance.  Respiratory: Negative for cough and shortness of breath.   Cardiovascular: Negative for cp or palpitations    Gastrointestinal: Negative for nausea, diarrhea and pos for  constipation.  Genitourinary: Negative for urgency and frequency.  Skin: Negative for pallor or rash   MSK pos for knee pain and leg pain that has improved  Neurological: Negative for weakness, light-headedness, numbness and headaches.  Hematological: Negative for adenopathy. Does not bruise/bleed easily.  Psychiatric/Behavioral: Negative for dysphoric mood. The patient is not nervous/anxious.          Objective:   Physical Exam  Constitutional: She appears well-developed and well-nourished. No distress.        Frail appearing elderly female  HENT:  Head: Normocephalic and atraumatic.  Mouth/Throat: Oropharynx is clear and moist.  Eyes: Conjunctivae and EOM are normal. Pupils are equal, round, and reactive to light.  Neck: Normal range of motion. Neck supple. No JVD present. Carotid bruit is not present. No thyromegaly present.  Cardiovascular: Normal rate, regular rhythm, normal heart sounds and intact distal pulses.   Pulmonary/Chest: Effort normal and breath sounds normal. No respiratory distress. She has no wheezes.  Abdominal: Soft. Bowel sounds are normal. She exhibits no distension and no mass. There is no tenderness.  Musculoskeletal: She exhibits no edema and no tenderness.       Poor rom bilat knees with slow gait  Brace on R ankle and calf  Lymphadenopathy:    She has no cervical adenopathy.  Neurological: She is alert. She has normal reflexes. No cranial nerve deficit. Coordination normal.  Skin: Skin is warm and dry. No rash noted. No erythema. No pallor.  Psychiatric: She has a normal mood and affect.          Assessment & Plan:

## 2011-03-05 NOTE — Assessment & Plan Note (Signed)
Right now - leg pain is much better off this and fosamax Will disc further plan at f/u Disc imp of low sat fat diet

## 2011-03-05 NOTE — Patient Instructions (Addendum)
Try to eat foods higher in iron  Let me know if at any time you want some physical therapy for balance Use your walker always Labs look better  Blood pressure also looks better  We will ref [facility] to [facility] ortho for knee pain and brace on let and foot swelling after a fall  Keep up the stool softeners and the fruit Follow up with me in 3 months  Stay off the simvastatin and the fosamax for now  Try preparation H for hemorrhoids

## 2011-03-05 NOTE — Assessment & Plan Note (Signed)
bp is better - feels better too Rev labs with pt and will continue current med

## 2011-03-05 NOTE — Assessment & Plan Note (Signed)
This is improved after stopping fosamax and zocor Will continue to hold those

## 2011-03-05 NOTE — Assessment & Plan Note (Signed)
Off fosamax due to leg pain- so re emphasized impt of ca and D

## 2011-05-10 ENCOUNTER — Other Ambulatory Visit: Payer: Self-pay | Admitting: Family Medicine

## 2011-05-10 ENCOUNTER — Other Ambulatory Visit: Payer: Self-pay | Admitting: Internal Medicine

## 2011-05-13 NOTE — Telephone Encounter (Signed)
[facility] request refill for Vit B12  #30 x 3and Spectravite SR/Lycopene (multivitamin)#60 x 1. Please advise about refill for Meclizine 25 mg since Dr Milinda Antis is out of office today and is not on computer. Last filled on 04/07/11 and pt was last seen on 03/05/11.

## 2011-05-13 NOTE — Telephone Encounter (Signed)
Ok to refill meclizine.

## 2011-05-13 NOTE — Telephone Encounter (Signed)
Medication phoned to [facility] pharmacy as instructed.  

## 2011-06-04 ENCOUNTER — Encounter: Payer: Self-pay | Admitting: Neurology

## 2011-06-04 ENCOUNTER — Ambulatory Visit (INDEPENDENT_AMBULATORY_CARE_PROVIDER_SITE_OTHER): Payer: Medicare Other | Admitting: Family Medicine

## 2011-06-04 ENCOUNTER — Encounter: Payer: Self-pay | Admitting: Family Medicine

## 2011-06-04 VITALS — BP 142/70 | HR 76 | Temp 97.8°F | Ht 60.0 in | Wt 153.2 lb

## 2011-06-04 DIAGNOSIS — I10 Essential (primary) hypertension: Secondary | ICD-10-CM

## 2011-06-04 DIAGNOSIS — E7800 Pure hypercholesterolemia, unspecified: Secondary | ICD-10-CM

## 2011-06-04 DIAGNOSIS — Z23 Encounter for immunization: Secondary | ICD-10-CM

## 2011-06-04 DIAGNOSIS — R29898 Other symptoms and signs involving the musculoskeletal system: Secondary | ICD-10-CM | POA: Insufficient documentation

## 2011-06-04 DIAGNOSIS — Z9181 History of falling: Secondary | ICD-10-CM | POA: Insufficient documentation

## 2011-06-04 DIAGNOSIS — M6281 Muscle weakness (generalized): Secondary | ICD-10-CM

## 2011-06-04 NOTE — Patient Instructions (Addendum)
Flu shot and pneumonia shot today  We will refer you to neurology for hand weakness at check out  Watch bp at home -- have Florentina Addison check it several times per week and call us with readings  Use walker whenever possible to decrease falls  Follow up with me in 3 months  Try to get 1200-1500 mg of calcium per day with at least 1000 iu of vitamin D - for bone health --- chewable is fine

## 2011-06-04 NOTE — Progress Notes (Signed)
Subjective:    Patient ID: Wendy Krueger, female    DOB: [birth year], 77 y.o.   MRN: [id number]  HPI Here for f/u of chronic health problems incl HTN , hand problem and fall risk  Doing ok  Is having problem with L hand - cannot move her fingers well  Started Saturday  Trouble extending all fingers except the thumb  No numbness  No pain No arm or neck pain  No speech problems or facial droop or other problem  This brand new   bp is   146/90  Today, has not checked at home No cp or palpitations or headaches or edema  No side effects to medicines  On lopressor   Wt is up 7 lb with bmi of 29 Was not eating anything -family is pushing to make her eat  Eats ensure every day  Appetite is still poor - knows she has to eat  Depression questionnaire - did indicate fatigue and also c/o moving slowly Lab Results  Component Value Date   TSH 0.746 01/05/2011   was ok in summer  adamately denies depression    Flu shot ? Pneumovax ?    Lipids -diet controlled  Lab Results  Component Value Date   CHOL 138 01/06/2011   HDL 40 01/06/2011   LDLCALC 66 01/06/2011   LDLDIRECT 99.7 09/12/2010   TRIG 161* 01/06/2011   CHOLHDL 3.5 01/06/2011   ok overall  Trying to stay active- tiring out easily   Filled out fall risk questionnaire and high score of 13 Also has OP  Uses a cane at all times and has a walker availible - is very comfortable with a cane  Per family - crosses her feet over when walking  No rugs in house   Patient Active Problem List  Diagnoses  . UNSPECIFIED VITAMIN D DEFICIENCY  . HYPERCHOLESTEROLEMIA, PURE  . HYPERTENSION  . OSTEOARTHRITIS  . OSTEOPOROSIS  . Leg pain, bilateral  . Constipation  . Left hand weakness  . Risk for falls   Past Medical History  Diagnosis Date  . Hypertension   . Osteoarthritis   . Osteoporosis   . Hyperlipidemia    Past Surgical History  Procedure Date  . Abdominal hysterectomy     BSO- fibroids  . Wrist fracture surgery 2/01      R arm  . Abi's     normal  . Left foot brace   . Dexa 1/05    osteoporosis   History  Substance Use Topics  . Smoking status: Never Smoker   . Smokeless tobacco: Not on file  . Alcohol Use: No   Family History  Problem Relation Age of Onset  . Heart attack Father   . Hypertension Father   . Heart attack Mother   . Heart attack Father   . Throat cancer Brother    Allergies  Allergen Reactions  . Amlodipine Besylate     REACTION: hives  . Calcitonin ([place])     REACTION: headache/ head pressure  . Fosamax     Let pain   . Simvastatin     Let pain   . Vitamin D     REACTION: nausea and vomiting   Current Outpatient Prescriptions on File Prior to Visit  Medication Sig Dispense Refill  . [facility] VITAMIN B12 1000 MCG tablet TAKE 1 TABLET BY MOUTH EVERY DAY  30 tablet  3  . docusate sodium (COLACE) 100 MG capsule Take 100 mg by mouth 2 (  two) times daily.       . meclizine (ANTIVERT) 25 MG tablet TAKE 1 TABLET BY MOUTH TWICE A DAY  60 tablet  0  . metoprolol (LOPRESSOR) 50 MG tablet Take 50 mg by mouth 2 (two) times daily.        . Multiple Vitamin (MULTIVITAMIN) tablet Take 1 tablet by mouth daily.        . potassium chloride (KLOR-CON) 10 MEQ CR tablet 2 tabs by mouth daily  60 tablet  4  . Calcium Carbonate (CALCIUM 500 PO) Take 1 tablet by mouth 3 (three) times daily.        . Cholecalciferol (VITAMIN D) 2000 UNITS CAPS Take 2 capsules by mouth daily.              Review of Systems Review of Systems  Constitutional: Negative for fever,  fatigue and unexpected weight change. appetite is poor Eyes: Negative for pain and visual disturbance.  Respiratory: Negative for cough and shortness of breath.   Cardiovascular: Negative for cp or palpitations    Gastrointestinal: Negative for nausea, diarrhea and constipation.  Genitourinary: Negative for urgency and frequency.  Skin: Negative for pallor or rash   Neurological: Negative for , light-headedness, numbness and  headaches. pos for hand weakness and poor balance Hematological: Negative for adenopathy. Does not bruise/bleed easily.  Psychiatric/Behavioral: Negative for dysphoric mood. The patient is not nervous/anxious.          Objective:   Physical Exam  Constitutional: She appears well-developed and well-nourished. No distress.  HENT:  Head: Normocephalic and atraumatic.  Nose: Nose normal.  Mouth/Throat: Oropharynx is clear and moist.  Eyes: Conjunctivae and EOM are normal. Pupils are equal, round, and reactive to light. No scleral icterus.  Neck: Normal range of motion. Neck supple. No JVD present. Carotid bruit is not present. No thyromegaly present.  Cardiovascular: Normal rate, regular rhythm, normal heart sounds and intact distal pulses.  Exam reveals no gallop.   Pulmonary/Chest: Effort normal and breath sounds normal. No respiratory distress. She has no wheezes.  Abdominal: Soft. Bowel sounds are normal. She exhibits no distension, no abdominal bruit and no mass. There is no tenderness.  Musculoskeletal: Normal range of motion. She exhibits no edema and no tenderness.  Lymphadenopathy:    She has no cervical adenopathy.  Neurological: She is alert. She displays no atrophy and no tremor. No cranial nerve deficit or sensory deficit. She exhibits abnormal muscle tone. Gait abnormal. Coordination normal.       Gait is shuffling and wide based -does need assistance  much of the time  L wrist and hand are weak with poor grip and inability to extend wrist and fingers well at all  approx 3/5 strength Nl on R Sensation and perfusion intact   Skin: Skin is warm and dry. No rash noted. No erythema. No pallor.  Psychiatric: She has a normal mood and affect.       Cheerful, and answers questions appropriately          Assessment & Plan:

## 2011-06-04 NOTE — Assessment & Plan Note (Signed)
Has had PT  Enc to use walker  Scored high on the assessment

## 2011-06-04 NOTE — Assessment & Plan Note (Signed)
Disc goals for lipids and reasons to control them Rev labs with pt Rev low sat fat diet in detail   

## 2011-06-04 NOTE — Assessment & Plan Note (Signed)
This is new -- ref to neurol- ? If UMN or LMN problem  No s/s of cva

## 2011-06-04 NOTE — Assessment & Plan Note (Signed)
bp up a bit - will have nurse in family check at home  Nervous today  Will update me No changes F/u 3 mo

## 2011-06-19 ENCOUNTER — Other Ambulatory Visit: Payer: Self-pay | Admitting: Family Medicine

## 2011-06-20 NOTE — Telephone Encounter (Signed)
Ok to refill? Last filled 05-10-11 and last seen 06-04-11.

## 2011-06-20 NOTE — Telephone Encounter (Signed)
Will refill electronically  

## 2011-06-23 ENCOUNTER — Other Ambulatory Visit: Payer: Self-pay | Admitting: Family Medicine

## 2011-07-16 ENCOUNTER — Ambulatory Visit (INDEPENDENT_AMBULATORY_CARE_PROVIDER_SITE_OTHER): Payer: Medicare Other | Admitting: Neurology

## 2011-07-16 ENCOUNTER — Other Ambulatory Visit (INDEPENDENT_AMBULATORY_CARE_PROVIDER_SITE_OTHER): Payer: Medicare Other

## 2011-07-16 ENCOUNTER — Encounter: Payer: Self-pay | Admitting: Neurology

## 2011-07-16 VITALS — BP 142/92 | HR 76 | Wt 152.0 lb

## 2011-07-16 DIAGNOSIS — I10 Essential (primary) hypertension: Secondary | ICD-10-CM

## 2011-07-16 DIAGNOSIS — R29898 Other symptoms and signs involving the musculoskeletal system: Secondary | ICD-10-CM

## 2011-07-16 DIAGNOSIS — R930 Abnormal findings on diagnostic imaging of skull and head, not elsewhere classified: Secondary | ICD-10-CM | POA: Diagnosis not present

## 2011-07-16 DIAGNOSIS — R9089 Other abnormal findings on diagnostic imaging of central nervous system: Secondary | ICD-10-CM

## 2011-07-16 LAB — BASIC METABOLIC PANEL
CO2: 28 mEq/L (ref 19–32)
Chloride: 102 mEq/L (ref 96–112)
Creatinine, Ser: 1 mg/dL (ref 0.4–1.2)
Potassium: 4 mEq/L (ref 3.5–5.1)

## 2011-07-16 NOTE — Patient Instructions (Signed)
Go to the basement to have your labs drawn today.  Your MRI is scheduled at [facility] on Wednesday, January 23rd at 11:00 am. Please go to the first floor radiology department 15 minutes prior to your scheduled appointment.  [phone].   Your next appointment with Dr. Modesto Charon is scheduled on February 6th at 12:00 pm.  [phone].

## 2011-07-16 NOTE — Progress Notes (Signed)
Dear Dr. Milinda Antis,  Thank you for having me see Wendy Krueger in consultation today at [facility] for her problem with weakness of her left hand.  As you may recall, she is a 78 y.o. year old female with a history of hypertension, thought secondary to a lumbar radiculopathy, who presents with a 2 month history of sudden onset of weakness of her left hand and arm.  She also describes her "fingers getting stuck" -- in particular her middle finger -- in a flexed position.  Her hand weakness and dexterity has improved.  There has been no involvement of her face or leg.  She denies sensory changes in the arm.  She has not had any injury nor does she suffer from neck pain.  She has never had a stroke before.  She did have an MRI brain in 12/2010 after having a "tick borne illness" which required hospitalization and she suffered from confusion and vertigo during that stay.  She has compression fractures of her lumbar spine.   Past Medical History  Diagnosis Date  . Hypertension   . Osteoarthritis   . Osteoporosis   . Hyperlipidemia     Past Surgical History  Procedure Date  . Abdominal hysterectomy     BSO- fibroids  . Wrist fracture surgery 2/01    R arm  . Abi's     normal  . Left foot brace   . Dexa 1/05    osteoporosis    History   Social History  . Marital Status: Widowed    Spouse Name: N/A    Number of Children: 5  . Years of Education: N/A   Occupational History  . retired    Social History Main Topics  . Smoking status: Never Smoker   . Smokeless tobacco: Never Used  . Alcohol Use: No  . Drug Use: No  . Sexually Active: None   Other Topics Concern  . None   Social History Narrative   Has 4 brothers, 1 sisterLives with 2 teenage grand kids and her daughterHas 4 daughters, 1 son    Family History  Problem Relation Age of Onset  . Heart attack Father   . Hypertension Father   . Heart attack Mother   . Heart attack Father   . Throat cancer Brother       Current Outpatient Prescriptions on File Prior to Visit  Medication Sig Dispense Refill  . [facility] VITAMIN B12 1000 MCG tablet TAKE 1 TABLET BY MOUTH EVERY DAY  30 tablet  3  . docusate sodium (COLACE) 100 MG capsule Take 100 mg by mouth 2 (two) times daily.       . meclizine (ANTIVERT) 25 MG tablet Take one tablet by mouth twice daily as needed for dizziness  60 tablet  5  . metoprolol (LOPRESSOR) 50 MG tablet Take 50 mg by mouth 2 (two) times daily.        . Multiple Vitamin (MULTIVITAMIN) tablet Take 1 tablet by mouth daily.        . potassium chloride (KLOR-CON) 10 MEQ CR tablet 2 tabs by mouth daily  60 tablet  4    Allergies  Allergen Reactions  . Amlodipine Besylate     REACTION: hives  . Calcitonin ([place])     REACTION: headache/ head pressure  . Fosamax     Let pain   . Simvastatin     Let pain   . Vitamin D     REACTION: nausea and vomiting  ROS:  13 systems were reviewed and are notable for right foot drop and unsteadiness of gait.  The unsteadiness may be worse since her left hand weakness began.  All other review of systems are unremarkable.   Examination:  Filed Vitals:   07/16/11 1412  BP: 142/92  Pulse: 76  Weight: 152 lb (68.947 kg)     In general, well appearing older woman.  Cardiovascular: The patient has a regular rate and rhythm and no carotid bruits.  Fundoscopy:  Disks are flat. Vessel caliber within normal limits.  Mental status:   The patient is oriented to person, place and time. Recent and remote memory are intact. Attention span and concentration are normal. Language including repetition, naming, following commands are intact. Fund of knowledge of current and historical events, as well as vocabulary are normal.  Cranial Nerves: Pupils are equally round and reactive to light. Visual fields full to confrontation. Extraocular movements are intact without nystagmus. Facial sensation and muscles of mastication are intact. Muscles of  facial expression are symmetric. Hearing decreased to bilateral finger rub. Tongue protrusion, uvula, palate midline.  Shoulder shrug intact  Motor:  The patient has normal bulk and tone, with mild left pronator drift.  There are no adventitious movements.   SA EF EE FA HF KF KE FDF FPF Right 5 5 5 5 5 5 5  4- 5 Left 5 5 5- 2 5 5 5 5 5   Also left WE, WF 2/5, Finger Flexors 2/5  Reflexes:   Biceps  Triceps Brachioradialis Knee Ankle  Right 3+  3+  3+   1+ 0  Left  3+  3+  3+   2+ 2+  absent right finger flexor reflex; brisk left  Toes down  Coordination:  Mild left sided dysmetria F2N.  Mild dysdiadokinesia.  Sensation is symmetric  Gait and Station are unstable, wide based.     Impression: Sudden onset left arm/hand monoparesis.  The left finger flexor reflex justifies my thought that this is likely an UMN lesion, likely secondary to ischemic stroke.   Recommendations: MRI brain, MRA head and neck.  I have instructed the patient to start taking aspirin 81mg  daily for secondary prevention of stroke.    Thank you for having [place] see Wendy Krueger in consultation.  Feel free to contact me with any questions.  Lupita Raider Modesto Charon, MD [facility], [place] [address] [place], [place] [address] Phone: [phone] Fax: [phone].

## 2011-07-23 ENCOUNTER — Other Ambulatory Visit: Payer: Self-pay | Admitting: Family Medicine

## 2011-07-23 DIAGNOSIS — Z1231 Encounter for screening mammogram for malignant neoplasm of breast: Secondary | ICD-10-CM

## 2011-07-24 ENCOUNTER — Telehealth: Payer: Self-pay

## 2011-07-24 ENCOUNTER — Ambulatory Visit (HOSPITAL_COMMUNITY)
Admission: RE | Admit: 2011-07-24 | Discharge: 2011-07-24 | Disposition: A | Discharge status: Home / Self Care | Payer: Medicare Other | Source: Ambulatory Visit | Attending: Neurology | Admitting: Neurology

## 2011-07-24 ENCOUNTER — Ambulatory Visit (HOSPITAL_COMMUNITY): Payer: Medicare Other

## 2011-07-24 DIAGNOSIS — R5383 Other fatigue: Secondary | ICD-10-CM | POA: Insufficient documentation

## 2011-07-24 DIAGNOSIS — R9089 Other abnormal findings on diagnostic imaging of central nervous system: Secondary | ICD-10-CM

## 2011-07-24 DIAGNOSIS — R5381 Other malaise: Secondary | ICD-10-CM | POA: Diagnosis not present

## 2011-07-24 DIAGNOSIS — R209 Unspecified disturbances of skin sensation: Secondary | ICD-10-CM | POA: Diagnosis not present

## 2011-07-24 DIAGNOSIS — E785 Hyperlipidemia, unspecified: Secondary | ICD-10-CM | POA: Diagnosis not present

## 2011-07-24 DIAGNOSIS — R29898 Other symptoms and signs involving the musculoskeletal system: Secondary | ICD-10-CM

## 2011-07-24 DIAGNOSIS — I10 Essential (primary) hypertension: Secondary | ICD-10-CM | POA: Insufficient documentation

## 2011-07-24 DIAGNOSIS — I6789 Other cerebrovascular disease: Secondary | ICD-10-CM | POA: Diagnosis not present

## 2011-07-24 DIAGNOSIS — I671 Cerebral aneurysm, nonruptured: Secondary | ICD-10-CM | POA: Diagnosis not present

## 2011-07-24 MED ORDER — GADOBENATE DIMEGLUMINE 529 MG/ML IV SOLN
15.0000 mL | Freq: Once | INTRAVENOUS | Status: AC
  Administered 2011-07-24: 15 mL via INTRAVENOUS

## 2011-07-24 NOTE — Telephone Encounter (Signed)
Radiology notified.

## 2011-07-24 NOTE — Telephone Encounter (Signed)
Because insurance has not been covering these and I don't need contrast for the head.

## 2011-07-24 NOTE — Telephone Encounter (Signed)
Call from radiology, they wanted to know if there was a particular reason you did not want contrast for her MRI brain since she is having the MRA neck with contrast did you just want to do the brain with and without also.  FYI her authorization would have to be changed to include with if you do want to change.

## 2011-07-26 NOTE — Progress Notes (Addendum)
Got MRI brain, MRA head and neck results.  No obvious right hemisphere stroke.  I think the aneurysmal dilatations of the right ICA and right PCA are without clinical significance.  For now I will observe her.  It is possible her weakness is lower motor neuron but I think this is less likely.  I do think she may have had a small stroke that we may not see on MRI given its focality.

## 2011-07-31 ENCOUNTER — Other Ambulatory Visit: Payer: Self-pay | Admitting: Family Medicine

## 2011-08-01 NOTE — Telephone Encounter (Signed)
[facility] request refill Klor con 10 meq # 60 x 11.

## 2011-08-07 ENCOUNTER — Encounter: Payer: Self-pay | Admitting: Neurology

## 2011-08-07 ENCOUNTER — Ambulatory Visit (INDEPENDENT_AMBULATORY_CARE_PROVIDER_SITE_OTHER): Payer: Medicare Other | Admitting: Neurology

## 2011-08-07 VITALS — BP 144/96 | HR 76 | Wt 154.0 lb

## 2011-08-07 DIAGNOSIS — Z8673 Personal history of transient ischemic attack (TIA), and cerebral infarction without residual deficits: Secondary | ICD-10-CM

## 2011-08-07 DIAGNOSIS — I6930 Unspecified sequelae of cerebral infarction: Secondary | ICD-10-CM

## 2011-08-07 NOTE — Progress Notes (Signed)
Dear Dr. Milinda Antis,  I saw  [name] back in [name] clinic for her problem with left arm weakness.  As you may recall, she is a 78 y.o. year old female with a history of sudden onset of predominantly distal left arm weakness.  I felt this was likely due to a small ischemic stroke.  MRI brain and MRA head and neck were not remarkable for stenosis.  I did start her on aspirin 81mg  per day.  She continues to feel that the hand is getting better.  She complains of no weakness in the proximal left arm.  She had what sounded like dystonic contraction of the left hand, but this has now improved.  Medical history, social history, and family history were reviewed and have not changed since the last clinic visit.  Current Outpatient Prescriptions on File Prior to Visit  Medication Sig Dispense Refill  . [facility] VITAMIN B12 1000 MCG tablet TAKE 1 TABLET BY MOUTH EVERY DAY  30 tablet  3  . docusate sodium (COLACE) 100 MG capsule Take 100 mg by mouth 2 (two) times daily.       Marland Kitchen KLOR-CON 10 10 MEQ tablet TAKE 2 TABLETS BY MOUTH EVERY DAY  60 tablet  11  . meclizine (ANTIVERT) 25 MG tablet Take one tablet by mouth twice daily as needed for dizziness  60 tablet  5  . metoprolol (LOPRESSOR) 50 MG tablet Take 50 mg by mouth 2 (two) times daily.        . Multiple Vitamin (MULTIVITAMIN) tablet Take 1 tablet by mouth daily.          Allergies  Allergen Reactions  . Amlodipine Besylate     REACTION: hives  . Calcitonin ([place])     REACTION: headache/ head pressure  . Fosamax     Let pain   . Simvastatin     Let pain   . Vitamin D     REACTION: nausea and vomiting    ROS:  13 systems were reviewed and  are unremarkable.  Exam: . Filed Vitals:   08/07/11 1137  BP: 144/96  Pulse: 76  Weight: 154 lb (69.854 kg)    In general, well appearing women.  Mental status:   The patient is oriented to person, place and time. Recent and remote memory are intact. Attention span and  concentration are normal. Language including repetition, naming, following commands are intact. Fund of knowledge of current and historical events, as well as vocabulary are normal.  Cranial Nerves: Pupils are equally round and reactive to light. Visual fields full to confrontation. Extraocular movements are intact without nystagmus. Facial sensation and muscles of mastication are intact. Muscles of facial expression are symmetric. Hearing intact to bilateral finger rub. Tongue protrusion, uvula, palate midline.  Shoulder shrug intact  Motor:  4/5 WF, WE, FE, FA strength left hand.  Other wise 5/5 elsewhere, except she has a old foot drop right foot.  Reflexes:  2+ throughout, except left finger flexors are brisk compared to right, absent at ankles.  Coordination:  Mildly dysmetric on left.   Impression/Recommendations: Likely small vessel lacunar stroke not visualized on MRI.  I recommend continuing her aspirin 81mg .  We will see the patient back on an as needed basis.  Wendy Krueger Modesto Charon, MD [facility], [place]

## 2011-08-07 NOTE — Patient Instructions (Signed)
Follow up as needed

## 2011-08-12 DIAGNOSIS — I6930 Unspecified sequelae of cerebral infarction: Secondary | ICD-10-CM | POA: Insufficient documentation

## 2011-08-20 ENCOUNTER — Other Ambulatory Visit: Payer: Self-pay | Admitting: Family Medicine

## 2011-08-22 ENCOUNTER — Ambulatory Visit
Admission: RE | Admit: 2011-08-22 | Discharge: 2011-08-22 | Disposition: A | Discharge status: Home / Self Care | Payer: Medicare Other | Source: Ambulatory Visit | Attending: Family Medicine | Admitting: Family Medicine

## 2011-08-22 DIAGNOSIS — Z1231 Encounter for screening mammogram for malignant neoplasm of breast: Secondary | ICD-10-CM | POA: Diagnosis not present

## 2011-09-02 ENCOUNTER — Encounter: Payer: Self-pay | Admitting: Family Medicine

## 2011-09-02 ENCOUNTER — Ambulatory Visit (INDEPENDENT_AMBULATORY_CARE_PROVIDER_SITE_OTHER): Payer: Medicare Other | Admitting: Family Medicine

## 2011-09-02 VITALS — BP 160/98 | HR 68 | Temp 98.1°F | Ht 60.0 in | Wt 156.8 lb

## 2011-09-02 DIAGNOSIS — E7800 Pure hypercholesterolemia, unspecified: Secondary | ICD-10-CM

## 2011-09-02 DIAGNOSIS — Z8673 Personal history of transient ischemic attack (TIA), and cerebral infarction without residual deficits: Secondary | ICD-10-CM

## 2011-09-02 DIAGNOSIS — I6930 Unspecified sequelae of cerebral infarction: Secondary | ICD-10-CM

## 2011-09-02 DIAGNOSIS — I10 Essential (primary) hypertension: Secondary | ICD-10-CM | POA: Diagnosis not present

## 2011-09-02 MED ORDER — LISINOPRIL 10 MG PO TABS: 10.0000 mg | ORAL_TABLET | Freq: Every day | ORAL | Status: DC

## 2011-09-02 NOTE — Assessment & Plan Note (Addendum)
This caused L hand weakness- which seems to have imp much MRI - no new findings however Added asa 81 mg  Will f/u neuro prn Disc s/s to watch for

## 2011-09-02 NOTE — Progress Notes (Signed)
Subjective:    Patient ID: Wendy Krueger, female    DOB: 03-01-1930, 78 y.o.   MRN: [id number]  HPI Here for f/u of chronic issues  bp is 160/98    Today No cp or palpitations or headaches or edema  No side effects to medicines    Has not had time to check bp at home Granddaughter got married   Saw doctor for hand weakness  Thought she may have had a mild stroke  Nothing on MRI seen  Some improvement in hand strength  Did not indicate PT would help  Doing some exercises at home It is much better   Is taking aspirin   Lab Results  Component Value Date   CHOL 138 01/06/2011   HDL 40 01/06/2011   LDLCALC 66 01/06/2011   LDLDIRECT 99.7 09/12/2010   TRIG 161* 01/06/2011   CHOLHDL 3.5 01/06/2011     Wt is up 2 lb with bmi of 60  Patient Active Problem List  Diagnoses  . UNSPECIFIED VITAMIN D DEFICIENCY  . HYPERCHOLESTEROLEMIA, PURE  . HYPERTENSION  . OSTEOARTHRITIS  . OSTEOPOROSIS  . Leg pain, bilateral  . Constipation  . Left hand weakness  . Risk for falls  . Arterial ischemic stroke, chronic   Past Medical History  Diagnosis Date  . Hypertension   . Osteoarthritis   . Osteoporosis   . Hyperlipidemia    Past Surgical History  Procedure Date  . Abdominal hysterectomy     BSO- fibroids  . Wrist fracture surgery 2/01    R arm  . Abi's     normal  . Left foot brace   . Dexa 1/05    osteoporosis   History  Substance Use Topics  . Smoking status: Never Smoker   . Smokeless tobacco: Never Used  . Alcohol Use: No   Family History  Problem Relation Age of Onset  . Heart attack Father   . Hypertension Father   . Heart attack Mother   . Heart attack Father   . Throat cancer Brother    Allergies  Allergen Reactions  . Amlodipine Besylate     REACTION: hives  . Calcitonin ([place])     REACTION: headache/ head pressure  . Fosamax     Let pain   . Simvastatin     Let pain   . Vitamin D     REACTION: nausea and vomiting   Current Outpatient  Prescriptions on File Prior to Visit  Medication Sig Dispense Refill  . aspirin 81 MG chewable tablet Chew 81 mg by mouth daily.      . [facility] VITAMIN B12 1000 MCG tablet TAKE 1 TABLET BY MOUTH EVERY DAY  30 tablet  3  . docusate sodium (COLACE) 100 MG capsule Take 100 mg by mouth 2 (two) times daily.       Marland Kitchen KLOR-CON 10 10 MEQ tablet TAKE 2 TABLETS BY MOUTH EVERY DAY  60 tablet  11  . meclizine (ANTIVERT) 25 MG tablet Take one tablet by mouth twice daily as needed for dizziness  60 tablet  5  . metoprolol (LOPRESSOR) 50 MG tablet TAKE 1 TABLET BY MOUTH TWICE A DAY  60 tablet  8  . Multiple Vitamin (MULTIVITAMIN) tablet Take 1 tablet by mouth daily.             Review of Systems Review of Systems  Constitutional: Negative for fever, appetite change, fatigue and unexpected weight change.  Eyes: Negative for pain and  visual disturbance.  Respiratory: Negative for cough and shortness of breath.   Cardiovascular: Negative for cp or palpitations    Gastrointestinal: Negative for nausea, diarrhea and constipation.  Genitourinary: Negative for urgency and frequency.  Skin: Negative for pallor or rash   Neurological: Negative for, light-headedness, numbness and headaches. pos for hand weakness that is improving  Hematological: Negative for adenopathy. Does not bruise/bleed easily.  Psychiatric/Behavioral: Negative for dysphoric mood. The patient is not nervous/anxious.          Objective:   Physical Exam  Constitutional: She appears well-developed and well-nourished. No distress.  HENT:  Head: Normocephalic and atraumatic.  Mouth/Throat: Oropharynx is clear and moist.  Eyes: Conjunctivae and EOM are normal. Pupils are equal, round, and reactive to light. No scleral icterus.  Neck: Normal range of motion. Neck supple. No JVD present. Carotid bruit is not present. No thyromegaly present.  Cardiovascular: Normal rate, regular rhythm, normal heart sounds and intact distal pulses.  Exam reveals  no gallop.   Pulmonary/Chest: Effort normal and breath sounds normal. No respiratory distress. She has no wheezes.  Abdominal: Soft. Bowel sounds are normal. She exhibits no distension and no abdominal bruit. There is no tenderness.  Musculoskeletal: She exhibits no edema and no tenderness.  Lymphadenopathy:    She has no cervical adenopathy.  Neurological: She is alert. She has normal reflexes. She displays no atrophy. No cranial nerve deficit or sensory deficit. She exhibits normal muscle tone. Coordination and gait normal.       L hand weakness is much improved with fairly good grip - but still problems with dexterity Nl speech and no facial droop          Assessment & Plan:

## 2011-09-02 NOTE — Assessment & Plan Note (Signed)
Worse last 2 check- and with recent cva Will add ace lisinopril 10 mg  Will stop it and update if side eff- disc Also check bp at home F/u with lab in 6-8 wk

## 2011-09-02 NOTE — Patient Instructions (Signed)
Your blood pressure is high  Continue current medicines Add lisinopril 10 mg daily - if any side effects like cough or dizziness (if bp was too low) Keep checking bp at home Stay as active as you can be -use your walker Follow up with me in 4-6 weeks

## 2011-09-06 NOTE — Assessment & Plan Note (Signed)
Controlled with diet and will continue to work on that Stressed imp in light of cva Disc goals for lipids and reasons to control them Rev labs with pt from last labs Rev low sat fat diet in detail

## 2011-09-25 ENCOUNTER — Other Ambulatory Visit: Payer: Self-pay | Admitting: *Deleted

## 2011-09-25 MED ORDER — CYANOCOBALAMIN 1000 MCG PO TABS: 1000.0000 ug | ORAL_TABLET | Freq: Every day | ORAL | Status: DC

## 2011-09-25 NOTE — Telephone Encounter (Signed)
Received faxed refill request from pharmacy for Vitamin B-12. Refill sent to pharmacy electronically. 

## 2011-10-14 ENCOUNTER — Ambulatory Visit (INDEPENDENT_AMBULATORY_CARE_PROVIDER_SITE_OTHER): Payer: Medicare Other | Admitting: Family Medicine

## 2011-10-14 ENCOUNTER — Encounter: Payer: Self-pay | Admitting: Family Medicine

## 2011-10-14 VITALS — BP 130/80 | HR 60 | Temp 97.6°F | Ht 60.0 in | Wt 158.5 lb

## 2011-10-14 DIAGNOSIS — E7800 Pure hypercholesterolemia, unspecified: Secondary | ICD-10-CM

## 2011-10-14 DIAGNOSIS — I10 Essential (primary) hypertension: Secondary | ICD-10-CM

## 2011-10-14 LAB — COMPREHENSIVE METABOLIC PANEL
Albumin: 4 g/dL (ref 3.5–5.2)
Alkaline Phosphatase: 54 U/L (ref 39–117)
BUN: 22 mg/dL (ref 6–23)
CO2: 27 mEq/L (ref 19–32)
Calcium: 9.4 mg/dL (ref 8.4–10.5)
GFR: 60.16 mL/min (ref >60.00)
Glucose, Bld: 85 mg/dL (ref 70–99)
Potassium: 4.4 mEq/L (ref 3.5–5.1)
Sodium: 142 mEq/L (ref 135–145)
Total Protein: 6.8 g/dL (ref 6.0–8.3)

## 2011-10-14 LAB — LIPID PANEL
Cholesterol: 285 mg/dL — ABNORMAL HIGH (ref 0–200)
Triglycerides: 226 mg/dL — ABNORMAL HIGH (ref 0.0–149.0)

## 2011-10-14 NOTE — Patient Instructions (Signed)
If you are interested in a shingles/zoster vaccine - call your insurance to check on coverage,( you should not get it within 1 month of other vaccines) , then call us for a prescription  for it to take to a pharmacy that gives the shot   Labs today Blood pressure is better - stay on current medicines Follow up in about 6 months

## 2011-10-14 NOTE — Assessment & Plan Note (Signed)
Improved with addn of lisinopril  bp in fair control at this time  No changes needed  Disc lifstyle change with low sodium diet and exercise   Lab today  Also disc opt of zoster vaccine - feel it would be wise - she will look into coverage

## 2011-10-14 NOTE — Progress Notes (Signed)
Subjective:    Patient ID: Wendy Krueger, female    DOB: 01-02-1930, 78 y.o.   MRN: [id number]  HPI Here for f/u of HTN and chol in setting of cva hx Feeling ok   On asa No new stroke symptoms   Last visit added lisinopril 10-- no side eff or problems- does not even know she is on it  bp is  130/80   Today- is improved No cp or palpitations or headaches or edema  No side effects to medicines     Wt is up 2 lb with bmi of 30 Gets around a lot - stays busy in the house   Lab Results  Component Value Date   CHOL 138 01/06/2011   HDL 40 01/06/2011   LDLCALC 66 01/06/2011   LDLDIRECT 99.7 09/12/2010   TRIG 161* 01/06/2011   CHOLHDL 3.5 01/06/2011    Due for lipids today Family says she is eating much better - appetite is better  Pays attention to what she is eating - fruit and veg   Is ? Interested in shingles vaccine  Unsure if her ins will cover it   Patient Active Problem List  Diagnoses  . UNSPECIFIED VITAMIN D DEFICIENCY  . HYPERCHOLESTEROLEMIA, PURE  . HYPERTENSION  . OSTEOARTHRITIS  . OSTEOPOROSIS  . Leg pain, bilateral  . Constipation  . Left hand weakness  . Risk for falls  . Arterial ischemic stroke, chronic   Past Medical History  Diagnosis Date  . Hypertension   . Osteoarthritis   . Osteoporosis   . Hyperlipidemia    Past Surgical History  Procedure Date  . Abdominal hysterectomy     BSO- fibroids  . Wrist fracture surgery 2/01    R arm  . Abi's     normal  . Left foot brace   . Dexa 1/05    osteoporosis   History  Substance Use Topics  . Smoking status: Never Smoker   . Smokeless tobacco: Never Used  . Alcohol Use: No   Family History  Problem Relation Age of Onset  . Heart attack Father   . Hypertension Father   . Heart attack Mother   . Heart attack Father   . Throat cancer Brother    Allergies  Allergen Reactions  . Amlodipine Besylate     REACTION: hives  . Calcitonin ([place])     REACTION: headache/ head pressure  .  Fosamax     Let pain   . Simvastatin     Let pain   . Vitamin D     REACTION: nausea and vomiting   Current Outpatient Prescriptions on File Prior to Visit  Medication Sig Dispense Refill  . aspirin 81 MG chewable tablet Chew 81 mg by mouth daily.      . cyanocobalamin ([facility] VITAMIN B12) 1000 MCG tablet Take 1 tablet (1,000 mcg total) by mouth daily.  30 tablet  3  . docusate sodium (COLACE) 100 MG capsule Take 100 mg by mouth 2 (two) times daily.       Marland Kitchen KLOR-CON 10 10 MEQ tablet TAKE 2 TABLETS BY MOUTH EVERY DAY  60 tablet  11  . lisinopril (PRINIVIL,ZESTRIL) 10 MG tablet Take 1 tablet (10 mg total) by mouth daily.  30 tablet  11  . meclizine (ANTIVERT) 25 MG tablet Take one tablet by mouth twice daily as needed for dizziness  60 tablet  5  . metoprolol (LOPRESSOR) 50 MG tablet TAKE 1 TABLET BY MOUTH TWICE  A DAY  60 tablet  8  . Multiple Vitamin (MULTIVITAMIN) tablet Take 1 tablet by mouth daily.           Review of Systems Review of Systems  Constitutional: Negative for fever, appetite change, fatigue and unexpected weight change.  Eyes: Negative for pain and visual disturbance.  Respiratory: Negative for cough and shortness of breath.   Cardiovascular: Negative for cp or palpitations    Gastrointestinal: Negative for nausea, diarrhea and constipation.  Genitourinary: Negative for urgency and frequency.  Skin: Negative for pallor or rash   Neurological: Negative for weakness, light-headedness, numbness and headaches.  Hematological: Negative for adenopathy. Does not bruise/bleed easily.  Psychiatric/Behavioral: Negative for dysphoric mood. The patient is not nervous/anxious.          Objective:   Physical Exam  Constitutional: She appears well-developed and well-nourished. No distress.       overwt and well appearing   HENT:  Head: Normocephalic and atraumatic.  Mouth/Throat: Oropharynx is clear and moist.  Eyes: Conjunctivae and EOM are normal. Pupils are equal, round,  and reactive to light. No scleral icterus.  Neck: Normal range of motion. Neck supple. No JVD present. Carotid bruit is not present. No thyromegaly present.  Cardiovascular: Normal rate, regular rhythm, normal heart sounds and intact distal pulses.  Exam reveals no gallop.   Pulmonary/Chest: Effort normal and breath sounds normal. No respiratory distress. She has no wheezes.  Abdominal: Soft. Bowel sounds are normal. She exhibits no distension, no abdominal bruit and no mass. There is no tenderness.  Musculoskeletal: She exhibits no edema and no tenderness.  Lymphadenopathy:    She has no cervical adenopathy.  Neurological: She is alert. She has normal reflexes. No cranial nerve deficit. She exhibits normal muscle tone. Coordination normal.  Skin: Skin is warm and dry. No rash noted. No erythema. No pallor.  Psychiatric: She has a normal mood and affect.          Assessment & Plan:

## 2011-10-14 NOTE — Assessment & Plan Note (Signed)
Labs today  Has changed diet - less sat fats now  Disc imp in cva prev - keeping chol low

## 2011-11-07 ENCOUNTER — Telehealth: Payer: Self-pay | Admitting: Family Medicine

## 2011-11-07 ENCOUNTER — Emergency Department (HOSPITAL_COMMUNITY)
Admission: EM | Admit: 2011-11-07 | Discharge: 2011-11-07 | Disposition: A | Discharge status: Home / Self Care | Payer: Medicare Other | Attending: Emergency Medicine | Admitting: Emergency Medicine

## 2011-11-07 ENCOUNTER — Encounter (HOSPITAL_COMMUNITY): Payer: Self-pay | Admitting: *Deleted

## 2011-11-07 ENCOUNTER — Emergency Department (HOSPITAL_COMMUNITY): Payer: Medicare Other

## 2011-11-07 DIAGNOSIS — K5289 Other specified noninfective gastroenteritis and colitis: Secondary | ICD-10-CM | POA: Diagnosis not present

## 2011-11-07 DIAGNOSIS — Z9089 Acquired absence of other organs: Secondary | ICD-10-CM | POA: Insufficient documentation

## 2011-11-07 DIAGNOSIS — R10819 Abdominal tenderness, unspecified site: Secondary | ICD-10-CM | POA: Insufficient documentation

## 2011-11-07 DIAGNOSIS — K529 Noninfective gastroenteritis and colitis, unspecified: Secondary | ICD-10-CM

## 2011-11-07 DIAGNOSIS — R109 Unspecified abdominal pain: Secondary | ICD-10-CM | POA: Diagnosis not present

## 2011-11-07 DIAGNOSIS — Z79899 Other long term (current) drug therapy: Secondary | ICD-10-CM | POA: Diagnosis not present

## 2011-11-07 DIAGNOSIS — R197 Diarrhea, unspecified: Secondary | ICD-10-CM | POA: Diagnosis not present

## 2011-11-07 DIAGNOSIS — I10 Essential (primary) hypertension: Secondary | ICD-10-CM | POA: Insufficient documentation

## 2011-11-07 DIAGNOSIS — Z90710 Acquired absence of both cervix and uterus: Secondary | ICD-10-CM | POA: Diagnosis not present

## 2011-11-07 DIAGNOSIS — D1803 Hemangioma of intra-abdominal structures: Secondary | ICD-10-CM | POA: Diagnosis not present

## 2011-11-07 DIAGNOSIS — K8020 Calculus of gallbladder without cholecystitis without obstruction: Secondary | ICD-10-CM | POA: Diagnosis not present

## 2011-11-07 LAB — CBC
HCT: 40.4 % (ref 36.0–46.0)
Hemoglobin: 13.3 g/dL (ref 12.0–15.0)
MCH: 29.6 pg (ref 26.0–34.0)
MCHC: 32.9 g/dL (ref 30.0–36.0)
MCV: 90 fL (ref 78.0–100.0)
RBC: 4.49 MIL/uL (ref 3.87–5.11)

## 2011-11-07 LAB — DIFFERENTIAL
Basophils Relative: 0 % (ref 0–1)
Eosinophils Absolute: 0 10*3/uL (ref 0.0–0.7)
Lymphs Abs: 0.8 10*3/uL (ref 0.7–4.0)
Monocytes Absolute: 0.9 10*3/uL (ref 0.1–1.0)
Monocytes Relative: 11 % (ref 3–12)
Neutrophils Relative %: 79 % — ABNORMAL HIGH (ref 43–77)

## 2011-11-07 LAB — COMPREHENSIVE METABOLIC PANEL
Albumin: 3.3 g/dL — ABNORMAL LOW (ref 3.5–5.2)
Alkaline Phosphatase: 74 U/L (ref 39–117)
BUN: 21 mg/dL (ref 6–23)
Creatinine, Ser: 0.96 mg/dL (ref 0.50–1.10)
GFR calc Af Amer: 63 mL/min — ABNORMAL LOW (ref >90)
Glucose, Bld: 107 mg/dL — ABNORMAL HIGH (ref 70–99)
Potassium: 3.7 mEq/L (ref 3.5–5.1)
Total Bilirubin: 0.4 mg/dL (ref 0.3–1.2)
Total Protein: 6.7 g/dL (ref 6.0–8.3)

## 2011-11-07 LAB — LACTIC ACID, PLASMA: Lactic Acid, Venous: 1.1 mmol/L (ref 0.5–2.2)

## 2011-11-07 MED ORDER — IOHEXOL 300 MG/ML  SOLN
100.0000 mL | Freq: Once | INTRAMUSCULAR | Status: AC | PRN
  Administered 2011-11-07: 100 mL via INTRAVENOUS

## 2011-11-07 MED ORDER — SODIUM CHLORIDE 0.9 % IV BOLUS (SEPSIS)
1000.0000 mL | Freq: Once | INTRAVENOUS | Status: AC
  Administered 2011-11-07: 1000 mL via INTRAVENOUS

## 2011-11-07 MED ORDER — METRONIDAZOLE 500 MG PO TABS: 500.0000 mg | ORAL_TABLET | Freq: Three times a day (TID) | ORAL | Status: AC

## 2011-11-07 MED ORDER — CIPROFLOXACIN HCL 500 MG PO TABS
500.0000 mg | ORAL_TABLET | Freq: Once | ORAL | Status: AC
  Administered 2011-11-07: 500 mg via ORAL
  Filled 2011-11-07: qty 1

## 2011-11-07 MED ORDER — ACETAMINOPHEN 325 MG PO TABS
650.0000 mg | ORAL_TABLET | Freq: Once | ORAL | Status: AC
Start: 2011-11-07 — End: 2011-11-07
  Administered 2011-11-07: 650 mg via ORAL
  Filled 2011-11-07: qty 2

## 2011-11-07 MED ORDER — ONDANSETRON HCL 8 MG PO TABS: 8.0000 mg | ORAL_TABLET | Freq: Three times a day (TID) | ORAL | Status: AC | PRN

## 2011-11-07 MED ORDER — METRONIDAZOLE 500 MG PO TABS
500.0000 mg | ORAL_TABLET | Freq: Once | ORAL | Status: AC
  Administered 2011-11-07: 500 mg via ORAL
  Filled 2011-11-07: qty 1

## 2011-11-07 MED ORDER — CIPROFLOXACIN HCL 500 MG PO TABS: 500.0000 mg | ORAL_TABLET | Freq: Two times a day (BID) | ORAL | Status: AC

## 2011-11-07 NOTE — ED Notes (Signed)
Pt drinking contrast for CT scan.

## 2011-11-07 NOTE — ED Notes (Signed)
Pt from home.  Diarrhea x 2 days.  Diarrhea x 2 times since 4 am.  Denies blood in stool.  Reports mild abdominal cramping with BMs.  No distress.  No abdominal tenderness on palpation.

## 2011-11-07 NOTE — ED Notes (Signed)
Pt from home.  Pt has had diarrhea x 2 days, taken imodium and drank Gatorade.  Unable to eat due to no appetite.  PCP told pt to be transported to ED.  Vitals stable.  HR NSR 90, BP 120/82.

## 2011-11-07 NOTE — Discharge Instructions (Signed)
Take antibiotics as prescribed.  Drink plenty of fluids to prevent dehydration.  Follow up with your primary care doctor early next week.  Please return to the ER if you develop worsening abdominal pain or diarrhea or uncontrolled vomiting. Diet for Diarrhea, Adult Having frequent, runny stools (diarrhea) has many causes. Diarrhea may be caused or worsened by food or drink. Diarrhea may be relieved by changing your diet. IF YOU ARE NOT TOLERATING SOLID FOODS:  Drink enough water and fluids to keep your urine clear or pale yellow.   Avoid sugary drinks and sodas as well as milk-based beverages.   Avoid beverages containing caffeine and alcohol.   You may try rehydrating beverages. You can make your own by following this recipe:    tsp table salt.    tsp baking soda.   ? tsp salt substitute (potassium chloride).   1 tbs + 1 tsp sugar.   1 qt water.  As your stools become more solid, you can start eating solid foods. Add foods one at a time. If a certain food causes your diarrhea to get worse, avoid that food and try other foods. A low fiber, low-fat, and lactose-free diet is recommended. Small, frequent meals may be better tolerated.  Starches  Allowed:  White, [place], and pita breads, plain rolls, buns, bagels. Plain muffins, matzo. Soda, saltine, or graham crackers. Pretzels, melba toast, zwieback. Cooked cereals made with water: cornmeal, farina, cream cereals. Dry cereals: refined corn, wheat, rice. Potatoes prepared any way without skins, refined macaroni, spaghetti, noodles, refined rice.   Avoid:  Bread, rolls, or crackers made with whole wheat, multi-grains, rye, bran seeds, nuts, or coconut. Corn tortillas or taco shells. Cereals containing whole grains, multi-grains, bran, coconut, nuts, or raisins. Cooked or dry oatmeal. Coarse wheat cereals, granola. Cereals advertised as "high-fiber." Potato skins. Whole grain pasta, wild or brown rice. Popcorn. Sweet potatoes/yams. Sweet  rolls, doughnuts, waffles, pancakes, sweet breads.  Vegetables  Allowed: Strained tomato and vegetable juices. Most well-cooked and canned vegetables without seeds. Fresh: Tender lettuce, cucumber without the skin, cabbage, spinach, bean sprouts.   Avoid: Fresh, cooked, or canned: Artichokes, baked beans, beet greens, broccoli, Brussels sprouts, corn, kale, legumes, peas, sweet potatoes. Cooked: Green or red cabbage, spinach. Avoid large servings of any vegetables, because vegetables shrink when cooked, and they contain more fiber per serving than fresh vegetables.  Fruit  Allowed: All fruit juices except prune juice. Cooked or canned: Apricots, applesauce, cantaloupe, cherries, fruit cocktail, grapefruit, grapes, kiwi, mandarin oranges, peaches, pears, plums, watermelon. Fresh: Apples without skin, ripe banana, grapes, cantaloupe, cherries, grapefruit, peaches, oranges, plums. Keep servings limited to  cup or 1 piece.   Avoid: Fresh: Apple with skin, apricots, mango, pears, raspberries, strawberries. Prune juice, stewed or dried prunes. Dried fruits, raisins, dates. Large servings of all fresh fruits.  Meat and Meat Substitutes  Allowed: Ground or well-cooked tender beef, ham, veal, lamb, pork, or poultry. Eggs, plain cheese. Fish, oysters, shrimp, lobster, other seafoods. Liver, organ meats.   Avoid: Tough, fibrous meats with gristle. Peanut butter, smooth or chunky. Cheese, nuts, seeds, legumes, dried peas, beans, lentils.  Milk  Allowed: Yogurt, lactose-free milk, kefir, drinkable yogurt, buttermilk, soy milk.   Avoid: Milk, chocolate milk, beverages made with milk, such as milk shakes.  Soups  Allowed: Bouillon, broth, or soups made from allowed foods. Any strained soup.   Avoid: Soups made from vegetables that are not allowed, cream or milk-based soups.  Desserts and Sweets  Allowed: Sugar-free gelatin,  sugar-free frozen ice pops made without sugar alcohol.   Avoid: Plain cakes  and cookies, pie made with allowed fruit, pudding, custard, cream pie. Gelatin, fruit, ice, sherbet, frozen ice pops. Ice cream, ice milk without nuts. Plain hard candy, honey, jelly, molasses, syrup, sugar, chocolate syrup, gumdrops, marshmallows.  Fats and Oils  Allowed: Avoid any fats and oils.   Avoid: Seeds, nuts, olives, avocados. Margarine, butter, cream, mayonnaise, salad oils, plain salad dressings made from allowed foods. Plain gravy, crisp bacon without rind.  Beverages  Allowed: Water, decaffeinated teas, oral rehydration solutions, sugar-free beverages.   Avoid: Fruit juices, caffeinated beverages (coffee, tea, soda or pop), alcohol, sports drinks, or lemon-lime soda or pop.  Condiments  Allowed: Ketchup, mustard, horseradish, vinegar, cream sauce, cheese sauce, cocoa powder. Spices in moderation: allspice, basil, bay leaves, celery powder or leaves, cinnamon, cumin powder, curry powder, ginger, mace, marjoram, onion or garlic powder, oregano, paprika, parsley flakes, ground pepper, rosemary, sage, savory, tarragon, thyme, turmeric.   Avoid: Coconut, honey.  Weight Monitoring: Weigh yourself every day. You should weigh yourself in the morning after you urinate and before you eat breakfast. Wear the same amount of clothing when you weigh yourself. Record your weight daily. Bring your recorded weights to your clinic visits. Tell your caregiver right away if you have gained 3 lb/1.4 kg or more in 1 day, 5 lb/2.3 kg in a week, or whatever amount you were told to report. SEEK IMMEDIATE MEDICAL CARE IF:   You are unable to keep fluids down.   You start to throw up (vomit) or diarrhea keeps coming back (persistent).   Abdominal pain develops, increases, or can be felt in one place (localizes).   You have an oral temperature above 102 F (38.9 C), not controlled by medicine.   Diarrhea contains blood or mucus.   You develop excessive weakness, dizziness, fainting, or extreme  thirst.  MAKE SURE YOU:   Understand these instructions.   Will watch your condition.   Will get help right away if you are not doing well or get worse.  Document Released: 09/07/2003 Document Revised: 06/06/2011 Document Reviewed: 12/29/2008 [facility] Patient Information 2012 [place], [place].

## 2011-11-07 NOTE — Telephone Encounter (Signed)
Per Emergent Line: Caller: Brenda/Child; PCP: Roxy Manns A.; CB#: [phone]. Call regarding Diarrhea with nausea that began on Monday 5/6, and was improved on Tues and Wed. Sxs worse today with last void unknown. Afebrile. Unable to sit unassisted and is very weak. Dizziness. Having 5-6 loose stools/day. Per Diarrhea Protocol, caller advised to call 911 to have this lady transported to the ED for eval of sxs. She is agreeable.

## 2011-11-07 NOTE — ED Provider Notes (Signed)
History     CSN: [id number]  Arrival date & time 11/07/11  1048   First MD Initiated Contact with Patient 11/07/11 1054      Chief Complaint  Patient presents with  . Abdominal Pain    (Consider location/radiation/quality/duration/timing/severity/associated sxs/prior treatment) HPI History provided by pt.   Pt c/o 4-5 episodes of diarrhea/day x 4-5 days.  Denies fever, N/V, hematochezia/melena. Has had intermittent, crampy lower abdominal pain.  Has been taking imodium and drinking gatorade w/out relief.  Patient's daughter reports that patient has complained of nausea and has therefore been avoiding fluids, and her appetite is decreased.  Her PCP referred her to ED for further evaluation.  No recent travel, medication change or antibiotics.   No known sick contacts.   Past Medical History  Diagnosis Date  . Hypertension   . Osteoarthritis   . Osteoporosis   . Hyperlipidemia     Past Surgical History  Procedure Date  . Abdominal hysterectomy     BSO- fibroids  . Wrist fracture surgery 2/01    R arm  . Abi's     normal  . Left foot brace   . Dexa 1/05    osteoporosis    Family History  Problem Relation Age of Onset  . Heart attack Father   . Hypertension Father   . Heart attack Mother   . Heart attack Father   . Throat cancer Brother     History  Substance Use Topics  . Smoking status: Never Smoker   . Smokeless tobacco: Never Used  . Alcohol Use: No    OB History    Grav Para Term Preterm Abortions TAB SAB Ect Mult Living                  Review of Systems  All other systems reviewed and are negative.    Allergies  Alendronate sodium; Amlodipine besylate; Calcitonin (sal[place]); Simvastatin; and Vitamin d  Home Medications   Current Outpatient Rx  Name Route Sig Dispense Refill  . ASPIRIN 81 MG PO CHEW Oral Chew 81 mg by mouth daily.    . CYANOCOBALAMIN 1000 MCG PO TABS Oral Take 1,000 mcg by mouth daily.     Marland Kitchen DOCUSATE SODIUM 100 MG PO CAPS Oral  Take 100 mg by mouth 2 (two) times daily.     Marland Kitchen LISINOPRIL 10 MG PO TABS Oral Take 10 mg by mouth daily.    Marland Kitchen METOPROLOL TARTRATE 50 MG PO TABS Oral Take 50 mg by mouth 2 (two) times daily.    . ADULT MULTIVITAMIN W/MINERALS CH Oral Take 1 tablet by mouth daily.    Marland Kitchen POTASSIUM CHLORIDE CRYS ER 10 MEQ PO TBCR Oral Take 20 mEq by mouth daily.      BP 124/82  Pulse 100  Temp 99 F (37.2 C)  Resp 18  SpO2 95%  Physical Exam  Nursing note and vitals reviewed. Constitutional: She is oriented to person, place, and time. She appears well-developed and well-nourished. No distress.  HENT:  Head: Normocephalic and atraumatic.  Mouth/Throat: Oropharynx is clear and moist.  Eyes:       Normal appearance  Neck: Normal range of motion.  Cardiovascular: Normal rate and regular rhythm.   Pulmonary/Chest: Effort normal and breath sounds normal. No respiratory distress.  Abdominal: Soft. Bowel sounds are normal. She exhibits no distension and no mass. There is no rebound and no guarding.       Mild tenderness left and right lower quadrant  as well as RUQ  Genitourinary:       No CVA tenderness  Musculoskeletal: Normal range of motion.  Neurological: She is alert and oriented to person, place, and time.  Skin: Skin is warm and dry. No rash noted.  Psychiatric: She has a normal mood and affect. Her behavior is normal.    ED Course  Procedures (including critical care time)  Labs Reviewed  DIFFERENTIAL - Abnormal; Notable for the following:    Neutrophils Relative 79 (*)    Lymphocytes Relative 9 (*)    All other components within normal limits  COMPREHENSIVE METABOLIC PANEL - Abnormal; Notable for the following:    Glucose, Bld 107 (*)    Albumin 3.3 (*)    GFR calc non Af Amer 54 (*)    GFR calc Af Amer 63 (*)    All other components within normal limits  CBC  LACTIC ACID, PLASMA   Ct Abdomen Pelvis W Contrast  11/07/2011  *RADIOLOGY REPORT*  Clinical Data: Abdominal pain.  Diarrhea.   CT ABDOMEN AND PELVIS WITH CONTRAST  Technique:  Multidetector CT imaging of the abdomen and pelvis was performed following the standard protocol during bolus administration of intravenous contrast.  Contrast: OMNIPAQUE IOHEXOL 300 MG/ML  SOLN  Comparison: 01/09/2011 [place].  Findings: Small amount pericardial fluid or thickening is present posteriorly.  Moderate hiatal hernia.  Visceral atherosclerosis is present.  Calcified gallstones are present in the gallbladder. Longest axis measurement of gallstones is 35 mm.  In the right hepatic lobe adjacent to the falciform ligament, there is a cavernous hemangioma that measures 4 cm x 3 cm.  Differences in technique account for different measurement from prior US.  The adrenal glands are normal.  Normal renal enhancement bilaterally.  35 mm right interpolar simple renal cyst.   Delayed excretion of contrast in the kidneys appears normal.  The pancreas appears normal.  No calcified common duct stones.  There is colitis extending from mid transverse colon to the rectum. Fluid levels are present within the colon.  The proximal colon is distended with stool without mural thickening likely secondary to colonic ileus from colitis.  No aggressive osseous lesions.  Chronic T11 and L4 compression fractures without significant retropulsion.  Lumbar facet arthrosis with grade 1 anterolisthesis of L4 on L5.  Hysterectomy. Per CMS PQRS reporting requirements (PQRS Measure 24): Given the patient's age of greater than 50 and the fracture site (hip, distal radius, or spine), the patient should be tested for osteoporosis using DXA, and the appropriate treatment considered based on the DXA results.  Colonic diverticulosis is incidentally noted in the inflamed sigmoid colon.  Fat containing periumbilical hernia noted.  IMPRESSION: 1.  Colitis extending from transverse colon to the rectum. Differential considerations include infection, inflammatory bowel disease and ischemia.  Notably,  the superior mesenteric and inferior mesenteric arteries are patent. 2.  Cholelithiasis. 3.  Hiatal hernia. 4.  Right hepatic lobe cavernous hemangioma. 5.  Hysterectomy. 6.  Age indeterminant thoracolumbar compression fractures and spondylosis.  7.  Right renal cyst.  Original Report Authenticated By: Andreas Newport, M.D.     1. Colitis       MDM  78yo F presents w/ diarrhea w/ intermittent, crampy abd pain, anorexia and nausea x 4-5 days.  Drinking very little fluids.  No known sick contacts, recent abx/travel/medication change. On exam, afebrile, well hydrated, lower abd and LUQ mildly ttp.  Labs and CT abd/pelvis pending.  Pt receiving IV fluids.  11:21 AM  Labs, including lactate are unremarkable.  CT shows colitis extending from transverse colon to rectum.  Results discussed w/ pt and her family.  Pt has not had any diarrhea in ED.  HR improved, temp increased to 100.1.  Pt received po tylenol.  She looks well enough to go home and Dr. Manus Gunning is in agreement.  Pt prescribed cipro, flagyl and zofran.  Recommended drinking plenty of fluids and following up with her PCP early next week.   Return precautions discussed.         Arie Sabina [place], [place] 11/07/11 1555

## 2011-11-08 NOTE — ED Provider Notes (Signed)
Medical screening examination/treatment/procedure(s) were conducted as a shared visit with non-physician practitioner(s) and myself.  I personally evaluated the patient during the encounter  Diarrhea x 5 days, no fever, vomiting, blood in stool.  Abdomen soft and nontender.  Nontoxic appearing. Colitis on CT with patent vessels, normal lactate, no abdominal pain.  Doubt ischemia.  Glynn Octave, MD 11/08/11 [phone]

## 2011-11-11 ENCOUNTER — Encounter: Payer: Self-pay | Admitting: Family Medicine

## 2011-11-11 ENCOUNTER — Ambulatory Visit (INDEPENDENT_AMBULATORY_CARE_PROVIDER_SITE_OTHER): Payer: Medicare Other | Admitting: Family Medicine

## 2011-11-11 VITALS — BP 110/70 | HR 57 | Temp 97.7°F | Ht 60.0 in | Wt 157.5 lb

## 2011-11-11 DIAGNOSIS — K5289 Other specified noninfective gastroenteritis and colitis: Secondary | ICD-10-CM | POA: Diagnosis not present

## 2011-11-11 DIAGNOSIS — K8020 Calculus of gallbladder without cholecystitis without obstruction: Secondary | ICD-10-CM | POA: Insufficient documentation

## 2011-11-11 DIAGNOSIS — K529 Noninfective gastroenteritis and colitis, unspecified: Secondary | ICD-10-CM | POA: Insufficient documentation

## 2011-11-11 NOTE — Patient Instructions (Signed)
Upon review of the ER records - you had colitis of some sort (inflammation of colon) It could be infectious (viral or bacterial) , or auto immune (ulcerative colitis or chrons) Keep up fluid intake- this is most important If this happens again we need to get you referred to GI I'm glad you are feeling better Finish the antibiotics Update if not starting to improve in a week or if worsening   Update me if any right sided abdominal pain or vomiting (in light of gallstones)

## 2011-11-11 NOTE — Assessment & Plan Note (Signed)
Pt is feeling better now - back to normal  Per rev of ER records- rev diff of colitis- infx vs auto immune or less likely mes ischemia Rev other incidental finding of CT  For now - will watch/ hydrate/ finish cipro and flagyl and update if symptoms return (would do GI ref) Low fat diet for gallstones too

## 2011-11-11 NOTE — Progress Notes (Signed)
Subjective:    Patient ID: Wendy Krueger, female    DOB: [birth year], 78 y.o.   MRN: [id number]  HPI Here for f/u of colitis Recently seen in ER for symptoms of diarrhea/ nausea/ cramping Other than hemorrhoids -no blood  Fluids given  zofran  CT showed colitis diffusely - diff incl infection/ inflam bowel dz Also showed gallstones. 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 No signs of gb inflammation  Adv to continue fluids  Is on cipro and flagyl  Is overall feeling better  Last diarrhea - was sat or Sunday, no blood  abd pain is better Fever is better   Last eaten pizza before this happened    Lab Results  Component Value Date   WBC 8.1 11/07/2011   HGB 13.3 11/07/2011   HCT 40.4 11/07/2011   MCV 90.0 11/07/2011   PLT 173 11/07/2011      Chemistry      Component Value Date/Time   NA 137 11/07/2011 1133   K 3.7 11/07/2011 1133   CL 101 11/07/2011 1133   CO2 24 11/07/2011 1133   BUN 21 11/07/2011 1133   CREATININE 0.96 11/07/2011 1133      Component Value Date/Time   CALCIUM 9.0 11/07/2011 1133   ALKPHOS 74 11/07/2011 1133   AST 16 11/07/2011 1133   ALT 9 11/07/2011 1133   BILITOT 0.4 11/07/2011 1133       Has no autoimmune colitis in the family    Patient Active Problem List  Diagnoses  . UNSPECIFIED VITAMIN D DEFICIENCY  . HYPERCHOLESTEROLEMIA, PURE  . HYPERTENSION  . OSTEOARTHRITIS  . OSTEOPOROSIS  . Leg pain, bilateral  . Constipation  . Left hand weakness  . Risk for falls  . Arterial ischemic stroke, chronic  . Colitis  . Gallstones   Past Medical History  Diagnosis Date  . Hypertension   . Osteoarthritis   . Osteoporosis   . Hyperlipidemia    Past Surgical History  Procedure Date  . Abdominal hysterectomy     BSO- fibroids  . Wrist fracture surgery 2/01    R arm  . Abi's     normal  . Left foot brace   . Dexa 1/05    osteoporosis   History  Substance Use Topics  . Smoking status: Never Smoker   . Smokeless tobacco: Never Used  . Alcohol Use: No    Family History  Problem Relation Age of Onset  . Heart attack Father   . Hypertension Father   . Heart attack Mother   . Heart attack Father   . Throat cancer Brother    Allergies  Allergen Reactions  . Alendronate Sodium     Let pain   . Amlodipine Besylate Hives  . Calcitonin ([place]) Other (See Comments)     headache/ head pressure  . Simvastatin Other (See Comments)    Let pain   . Vitamin D Nausea And Vomiting   Current Outpatient Prescriptions on File Prior to Visit  Medication Sig Dispense Refill  . aspirin 81 MG chewable tablet Chew 81 mg by mouth daily.      . ciprofloxacin (CIPRO) 500 MG tablet Take 1 tablet (500 mg total) by mouth 2 (two) times daily.  14 tablet  0  . cyanocobalamin 1000 MCG tablet Take 1,000 mcg by mouth daily.       Marland Kitchen docusate sodium (COLACE) 100 MG capsule Take 100 mg by mouth 2 (two) times daily.       Marland Kitchen  lisinopril (PRINIVIL,ZESTRIL) 10 MG tablet Take 10 mg by mouth daily.      . metroNIDAZOLE (FLAGYL) 500 MG tablet Take 1 tablet (500 mg total) by mouth 3 (three) times daily.  14 tablet  0  . Multiple Vitamin (MULITIVITAMIN WITH MINERALS) TABS Take 1 tablet by mouth daily.      . ondansetron (ZOFRAN) 8 MG tablet Take 1 tablet (8 mg total) by mouth every 8 (eight) hours as needed for nausea.  20 tablet  0  . potassium chloride (K-DUR,KLOR-CON) 10 MEQ tablet Take 20 mEq by mouth daily.      . metoprolol (LOPRESSOR) 50 MG tablet Take 50 mg by mouth 2 (two) times daily.              Review of Systems Review of Systems  Constitutional: Negative for fever, appetite change,  and unexpected weight change. pos for fatigue- getting her energy back  Eyes: Negative for pain and visual disturbance.  Respiratory: Negative for cough and shortness of breath.   Cardiovascular: Negative for cp or palpitations    Gastrointestinal: Negative for nausea, diarrhea and constipation.neg for abdominal pain or blood in stool (now)  Genitourinary: Negative for  urgency and frequency.  Skin: Negative for pallor or rash   Neurological: Negative for weakness, light-headedness, numbness and headaches.  Hematological: Negative for adenopathy. Does not bruise/bleed easily.  Psychiatric/Behavioral: Negative for dysphoric mood. The patient is not nervous/anxious.          Objective:   Physical Exam  Constitutional: She appears well-developed and well-nourished. No distress.       overwt and well appearing   HENT:  Head: Normocephalic and atraumatic.  Mouth/Throat: Oropharynx is clear and moist.  Eyes: Conjunctivae and EOM are normal. Pupils are equal, round, and reactive to light. No scleral icterus.  Neck: Normal range of motion. Neck supple. No JVD present. Carotid bruit is not present. No thyromegaly present.  Abdominal: Soft. Normal appearance and bowel sounds are normal. She exhibits no distension, no pulsatile liver, no abdominal bruit, no ascites and no mass. There is no hepatosplenomegaly. There is no tenderness. There is no rigidity, no rebound, no guarding and negative Murphy's sign.  Musculoskeletal: Normal range of motion. She exhibits no edema and no tenderness.  Lymphadenopathy:    She has no cervical adenopathy.  Neurological: She has normal reflexes. She exhibits normal muscle tone.  Skin: Skin is warm and dry. No erythema. No pallor.       Brisk cap refil and good turgor   Psychiatric: She has a normal mood and affect.          Assessment & Plan:

## 2011-11-14 ENCOUNTER — Encounter: Payer: Self-pay | Admitting: Family Medicine

## 2011-11-14 NOTE — Assessment & Plan Note (Signed)
Incidentally seen on CT scan/ asymptomatic Disc watching fat in diet and will continue to obs No s/s of cholecystitis

## 2011-12-19 ENCOUNTER — Other Ambulatory Visit: Payer: Self-pay

## 2011-12-19 MED ORDER — MECLIZINE HCL 25 MG PO TABS: 25.0000 mg | ORAL_TABLET | Freq: Three times a day (TID) | ORAL | Status: DC | PRN

## 2011-12-19 NOTE — Telephone Encounter (Signed)
We need to use caution with that due to sedation- I would rather she use it as needed only  F/u if dizziness does not improve Will refill electronically

## 2011-12-19 NOTE — Telephone Encounter (Signed)
[facility] request refill Meclizine 25 mg  Take one tab by mouth twice a day. Not on med list. Pt said takes med daily so will not have dizziness. Please advise.

## 2011-12-19 NOTE — Telephone Encounter (Signed)
Patient informed/SLS  

## 2012-01-02 ENCOUNTER — Other Ambulatory Visit: Payer: Self-pay | Admitting: Family Medicine

## 2012-01-03 NOTE — Telephone Encounter (Signed)
I'm ok to refil - will send electronically  Please talk to her and see if dizzy... sched f/u appt please

## 2012-01-03 NOTE — Telephone Encounter (Signed)
[facility] request Meclizine. Last filled 12/19/11. Not on current med list. 12/19/11 note mentioned pt f/u if dizziness continue.Please advise.

## 2012-01-03 NOTE — Telephone Encounter (Signed)
Pt states that she is not dizzy and scheduled a F/U visit for 01/07/2012

## 2012-01-07 ENCOUNTER — Encounter: Payer: Self-pay | Admitting: Family Medicine

## 2012-01-07 ENCOUNTER — Ambulatory Visit (INDEPENDENT_AMBULATORY_CARE_PROVIDER_SITE_OTHER): Payer: Medicare Other | Admitting: Family Medicine

## 2012-01-07 VITALS — BP 126/72 | HR 68 | Temp 98.0°F | Ht 60.0 in | Wt 155.2 lb

## 2012-01-07 DIAGNOSIS — I10 Essential (primary) hypertension: Secondary | ICD-10-CM

## 2012-01-07 DIAGNOSIS — E7800 Pure hypercholesterolemia, unspecified: Secondary | ICD-10-CM | POA: Diagnosis not present

## 2012-01-07 DIAGNOSIS — Z9181 History of falling: Secondary | ICD-10-CM | POA: Diagnosis not present

## 2012-01-07 NOTE — Progress Notes (Signed)
Subjective:    Patient ID: Wendy Krueger, female    DOB: [birth year], 78 y.o.   MRN: [id number]  HPI Here for f/u of chronic conditions  Is feeling ok  Nothing new going on   No more occurances of colitis at all  Staying very regular with bms  So far so good   bp is good     Today BP Readings from Last 3 Encounters:  01/07/12 126/72  11/11/11 110/70  11/07/11 120/72   improved with increase in ace dose No cp or palpitations or headaches or edema  No side effects to medicines    Wt is down 2 lb with bmi of 30 Diet --is eating summer time vegetables   Takes K   Has high chol  Had myalgias with simvastatin in the past Lab Results  Component Value Date   CHOL 285* 10/14/2011   CHOL 138 01/06/2011   CHOL 152 01/05/2011   Lab Results  Component Value Date   HDL 47.90 10/14/2011   HDL 40 01/06/2011   HDL 50 01/05/2011   Lab Results  Component Value Date   LDLCALC 66 01/06/2011   LDLCALC 78 01/05/2011   LDLCALC 82 01/19/2010   Lab Results  Component Value Date   TRIG 226.0* 10/14/2011   TRIG 161* 01/06/2011   TRIG 120 01/05/2011   Lab Results  Component Value Date   CHOLHDL 6 10/14/2011   CHOLHDL 3.5 01/06/2011   CHOLHDL 3.0 01/05/2011   Lab Results  Component Value Date   LDLDIRECT 195.2 10/14/2011   LDLDIRECT 99.7 09/12/2010   LDLDIRECT 130.3 07/23/2010   was way up off med  Unsure if she would try another med    Zoster status -- she never called her insurance about it  Not interested at this time  In fact -no longer interested in health mt at her age  She had some confusion about meclizine refil and was taking bid instead of prn  Hx of intermittent chronic dizziness since her cva and I explained that meclizine is for prn use  She does not think it makes her tired  Dizziness is mild and she uses walker to ambulate No falls   Patient Active Problem List  Diagnosis  . UNSPECIFIED VITAMIN D DEFICIENCY  . HYPERCHOLESTEROLEMIA, PURE  . HYPERTENSION  . OSTEOARTHRITIS    . OSTEOPOROSIS  . Leg pain, bilateral  . Constipation  . Left hand weakness  . Risk for falls  . Arterial ischemic stroke, chronic  . Colitis  . Gallstones   Past Medical History  Diagnosis Date  . Hypertension   . Osteoarthritis   . Osteoporosis   . Hyperlipidemia   . Colitis - presumed infectious origin     one ER visit   . Asymptomatic gallstones    Past Surgical History  Procedure Date  . Abdominal hysterectomy     BSO- fibroids  . Wrist fracture surgery 2/01    R arm  . Abi's     normal  . Left foot brace   . Dexa 1/05    osteoporosis   History  Substance Use Topics  . Smoking status: Never Smoker   . Smokeless tobacco: Never Used  . Alcohol Use: No   Family History  Problem Relation Age of Onset  . Heart attack Father   . Hypertension Father   . Heart attack Mother   . Heart attack Father   . Throat cancer Brother    Allergies  Allergen Reactions  .  Alendronate Sodium     Let pain   . Amlodipine Besylate Hives  . Calcitonin ([place]) Other (See Comments)     headache/ head pressure  . Simvastatin Other (See Comments)    Let pain   . Vitamin D Nausea And Vomiting   Current Outpatient Prescriptions on File Prior to Visit  Medication Sig Dispense Refill  . aspirin 81 MG chewable tablet Chew 81 mg by mouth daily.      . cyanocobalamin 1000 MCG tablet Take 1,000 mcg by mouth daily.       Marland Kitchen docusate sodium (COLACE) 100 MG capsule Take 100 mg by mouth 2 (two) times daily.       Marland Kitchen lisinopril (PRINIVIL,ZESTRIL) 10 MG tablet Take 10 mg by mouth daily.      . meclizine (ANTIVERT) 25 MG tablet TAKE 1 TABLET BY MOUTH 3 TIMES A DAY AS NEEDED FOR DIZZINESS  30 tablet  0  . metoprolol (LOPRESSOR) 50 MG tablet Take 50 mg by mouth 2 (two) times daily.      . Multiple Vitamin (MULITIVITAMIN WITH MINERALS) TABS Take 1 tablet by mouth daily.      . potassium chloride (K-DUR,KLOR-CON) 10 MEQ tablet Take 20 mEq by mouth daily.         Review of Systems Review of  Systems  Constitutional: Negative for fever, appetite change, fatigue and unexpected weight change.  Eyes: Negative for pain and visual disturbance.  Respiratory: Negative for cough and shortness of breath.   Cardiovascular: Negative for cp or palpitations    Gastrointestinal: Negative for nausea, diarrhea and constipation.  Genitourinary: Negative for urgency and frequency.  Skin: Negative for pallor or rash   Neurological: Negative for weakness, light-headedness, numbness and headaches.  Hematological: Negative for adenopathy. Does not bruise/bleed easily.  Psychiatric/Behavioral: Negative for dysphoric mood. The patient is occasionally anxious .         Objective:   Physical Exam  Constitutional: She appears well-developed and well-nourished. No distress.       Frail appearing elderly female in no distress   HENT:  Head: Normocephalic and atraumatic.  Mouth/Throat: Oropharynx is clear and moist.  Eyes: Conjunctivae and EOM are normal. Pupils are equal, round, and reactive to light. No scleral icterus.  Neck: Normal range of motion. Neck supple. No JVD present. Carotid bruit is not present. No thyromegaly present.  Cardiovascular: Normal rate and regular rhythm.  Exam reveals no gallop.   Pulmonary/Chest: Effort normal and breath sounds normal. No respiratory distress. She has no wheezes.  Abdominal: Soft. She exhibits no distension and no abdominal bruit. There is no tenderness.  Musculoskeletal: She exhibits no edema.  Lymphadenopathy:    She has no cervical adenopathy.  Neurological: She is alert. She has normal reflexes. No cranial nerve deficit. She exhibits normal muscle tone.  Skin: Skin is warm and dry. No rash noted. No pallor.  Psychiatric: She has a normal mood and affect.          Assessment & Plan:

## 2012-01-07 NOTE — Assessment & Plan Note (Signed)
Pt does have chronic intermittent dizziness - and takes meclizine Explained that meclizine itself can cause sedation and falls- so to take with caution only as needed  She expressed understanding

## 2012-01-07 NOTE — Assessment & Plan Note (Signed)
Intol simvastatin and chol way up without it  Disc goals for lipids and reasons to control them Rev labs with pt Rev low sat fat diet in detail  She declines further tx for chol at her age

## 2012-01-07 NOTE — Assessment & Plan Note (Signed)
bp in fair control at this time  No changes needed  Disc lifstyle change with low sodium diet and exercise   Reviewed last labs F/u 6 mo

## 2012-01-07 NOTE — Patient Instructions (Addendum)
Blood pressure is good  If you change your mind about cholesterol medicine let me know, or about a shingles vaccine  For high cholesterol Avoid red meat/ fried foods/ egg yolks/ fatty breakfast meats/ butter, cheese and high fat dairy/ and shellfish   Take the meclizine (dizziness medicine) only when you need it

## 2012-02-02 ENCOUNTER — Other Ambulatory Visit: Payer: Self-pay | Admitting: Family Medicine

## 2012-02-05 DIAGNOSIS — H2510 Age-related nuclear cataract, unspecified eye: Secondary | ICD-10-CM | POA: Diagnosis not present

## 2012-04-02 ENCOUNTER — Other Ambulatory Visit: Payer: Self-pay | Admitting: Family Medicine

## 2012-04-14 ENCOUNTER — Ambulatory Visit: Payer: Medicare Other | Admitting: Family Medicine

## 2012-05-16 ENCOUNTER — Other Ambulatory Visit: Payer: Self-pay | Admitting: Family Medicine

## 2012-07-10 ENCOUNTER — Ambulatory Visit (INDEPENDENT_AMBULATORY_CARE_PROVIDER_SITE_OTHER): Payer: Medicare Other | Admitting: Family Medicine

## 2012-07-10 ENCOUNTER — Encounter: Payer: Self-pay | Admitting: Family Medicine

## 2012-07-10 VITALS — BP 150/88 | HR 75 | Temp 98.4°F | Ht 60.0 in | Wt 152.5 lb

## 2012-07-10 DIAGNOSIS — E559 Vitamin D deficiency, unspecified: Secondary | ICD-10-CM | POA: Diagnosis not present

## 2012-07-10 DIAGNOSIS — Z23 Encounter for immunization: Secondary | ICD-10-CM

## 2012-07-10 DIAGNOSIS — E7800 Pure hypercholesterolemia, unspecified: Secondary | ICD-10-CM

## 2012-07-10 DIAGNOSIS — I10 Essential (primary) hypertension: Secondary | ICD-10-CM | POA: Diagnosis not present

## 2012-07-10 LAB — COMPREHENSIVE METABOLIC PANEL
Albumin: 4.1 g/dL (ref 3.5–5.2)
Alkaline Phosphatase: 57 U/L (ref 39–117)
Calcium: 10 mg/dL (ref 8.4–10.5)
Chloride: 106 mEq/L (ref 96–112)
Creatinine, Ser: 1.1 mg/dL (ref 0.4–1.2)
GFR: 50.18 mL/min — ABNORMAL LOW (ref >60.00)
Sodium: 141 mEq/L (ref 135–145)
Total Bilirubin: 0.4 mg/dL (ref 0.3–1.2)

## 2012-07-10 LAB — LIPID PANEL
Cholesterol: 273 mg/dL — ABNORMAL HIGH (ref 0–200)
VLDL: 30 mg/dL (ref 0.0–40.0)

## 2012-07-10 LAB — TSH: TSH: 1.35 u[IU]/mL (ref 0.35–5.50)

## 2012-07-10 MED ORDER — METOPROLOL TARTRATE 50 MG PO TABS: 50.0000 mg | ORAL_TABLET | Freq: Two times a day (BID) | ORAL | Status: DC

## 2012-07-10 MED ORDER — CYANOCOBALAMIN 1000 MCG PO TABS: 1000.0000 ug | ORAL_TABLET | Freq: Every day | ORAL | Status: DC

## 2012-07-10 MED ORDER — POTASSIUM CHLORIDE CRYS ER 10 MEQ PO TBCR: 20.0000 meq | EXTENDED_RELEASE_TABLET | Freq: Every day | ORAL | Status: DC

## 2012-07-10 MED ORDER — LISINOPRIL 10 MG PO TABS: 10.0000 mg | ORAL_TABLET | Freq: Every day | ORAL | Status: DC

## 2012-07-10 NOTE — Assessment & Plan Note (Signed)
D level today Expect low  No falls or fx  Intol of px D  Will try 1000 iu otc and update

## 2012-07-10 NOTE — Assessment & Plan Note (Signed)
Pt intol of simvastatin  Diet fair Lab today and comment  Disc low sat fat diet

## 2012-07-10 NOTE — Assessment & Plan Note (Signed)
bp high- she skipped her med today  Explained she does not need to do that for visits  Will have grandaughter check it and update me  Lab today

## 2012-07-10 NOTE — Progress Notes (Signed)
Subjective:    Patient ID: Wendy Krueger, female    DOB: 09/04/1929, 79 y.o.   MRN: [id number]  HPI Here for f/u of chronic health problems  Nothing new going on today  Feeling fine for the most part   bp is up today - she has not taken her med at all today  No cp or palpitations or headaches or edema  No side effects to medicines  BP Readings from Last 3 Encounters:  07/10/12 150/88  01/07/12 126/72  11/11/11 110/70    Takes K as well  Due for lab  Wt is down 4 lb Is eating ok - eats all she wants    Hyperlipidemia  Due for labs Diet control- intol of simvastatin in past  Lab Results  Component Value Date   CHOL 285* 10/14/2011   HDL 47.90 10/14/2011   LDLCALC 66 01/06/2011   LDLDIRECT 195.2 10/14/2011   TRIG 226.0* 10/14/2011   CHOLHDL 6 10/14/2011   she does not watch her diet - she is not very concerned about it   Has vit D def suppl- does not take any due to nausea with px kind- has not tried to take otc Does take mvi Falls or fx-- none at all    Needs a flu shot today   Patient Active Problem List  Diagnosis  . UNSPECIFIED VITAMIN D DEFICIENCY  . HYPERCHOLESTEROLEMIA, PURE  . HYPERTENSION  . OSTEOARTHRITIS  . OSTEOPOROSIS  . Leg pain, bilateral  . Constipation  . Left hand weakness  . Risk for falls  . Arterial ischemic stroke, chronic  . Colitis  . Gallstones   Past Medical History  Diagnosis Date  . Hypertension   . Osteoarthritis   . Osteoporosis   . Hyperlipidemia   . Colitis - presumed infectious origin     one ER visit   . Asymptomatic gallstones    Past Surgical History  Procedure Date  . Abdominal hysterectomy     BSO- fibroids  . Wrist fracture surgery 2/01    R arm  . Abi's     normal  . Left foot brace   . Dexa 1/05    osteoporosis   History  Substance Use Topics  . Smoking status: Never Smoker   . Smokeless tobacco: Never Used  . Alcohol Use: No   Family History  Problem Relation Age of Onset  . Heart attack  Father   . Hypertension Father   . Heart attack Mother   . Heart attack Father   . Throat cancer Brother    Allergies  Allergen Reactions  . Alendronate Sodium     Let pain   . Amlodipine Besylate Hives  . Calcitonin ([place]) Other (See Comments)     headache/ head pressure  . Simvastatin Other (See Comments)    Let pain   . Vitamin D Nausea And Vomiting   Current Outpatient Prescriptions on File Prior to Visit  Medication Sig Dispense Refill  . aspirin 81 MG chewable tablet Chew 81 mg by mouth daily.      . [facility] VITAMIN B12 1000 MCG tablet TAKE 1 TABLET EVERY DAY  30 tablet  5  . docusate sodium (COLACE) 100 MG capsule Take 100 mg by mouth 2 (two) times daily.       Marland Kitchen lisinopril (PRINIVIL,ZESTRIL) 10 MG tablet Take 10 mg by mouth daily.      . metoprolol (LOPRESSOR) 50 MG tablet TAKE 1 TABLET BY MOUTH TWICE A DAY  60 tablet  1  . Multiple Vitamins-Minerals ([facility] SPECTRAVITE ADULT 50+) TABS TAKE 1 TABLET BY MOUTH EVERY DAY  60 tablet  6  . potassium chloride (K-DUR,KLOR-CON) 10 MEQ tablet Take 20 mEq by mouth daily.      . meclizine (ANTIVERT) 25 MG tablet TAKE 1 TABLET BY MOUTH 3 TIMES A DAY AS NEEDED FOR DIZZINESS  30 tablet  0    Review of Systems Review of Systems  Constitutional: Negative for fever, appetite change, fatigue and unexpected weight change.  Eyes: Negative for pain and visual disturbance.  Respiratory: Negative for cough and shortness of breath.   Cardiovascular: Negative for cp or palpitations    Gastrointestinal: Negative for nausea, diarrhea and constipation.  Genitourinary: Negative for urgency and frequency.  Skin: Negative for pallor or rash   Neurological: Negative for weakness, light-headedness, numbness and headaches.  Hematological: Negative for adenopathy. Does not bruise/bleed easily.  Psychiatric/Behavioral: Negative for dysphoric mood. The patient is not nervous/anxious.         Objective:   Physical Exam  Constitutional: She appears  well-developed and well-nourished. No distress.       obese and well appearing   HENT:  Head: Normocephalic and atraumatic.  Nose: Nose normal.  Mouth/Throat: Oropharynx is clear and moist.  Eyes: Conjunctivae normal and EOM are normal. Pupils are equal, round, and reactive to light. Right eye exhibits no discharge. Left eye exhibits no discharge. No scleral icterus.  Neck: Normal range of motion. Neck supple. No JVD present. No thyromegaly present.  Cardiovascular: Normal rate, regular rhythm, normal heart sounds and intact distal pulses.  Exam reveals no gallop.   Pulmonary/Chest: Breath sounds normal. No respiratory distress. She has no wheezes.  Abdominal: Soft. Bowel sounds are normal. She exhibits no distension and no mass. There is no tenderness.  Musculoskeletal: She exhibits no edema and no tenderness.  Lymphadenopathy:    She has no cervical adenopathy.  Neurological: She is alert. She has normal reflexes. No cranial nerve deficit. She exhibits normal muscle tone. Coordination normal.  Skin: Skin is warm and dry. No rash noted. No erythema. No pallor.  Psychiatric: She has a normal mood and affect.          Assessment & Plan:

## 2012-07-10 NOTE — Patient Instructions (Addendum)
Flu shot today  Have Katie check your blood pressure at home - and let me know if it is above 140/90  Avoid red meat/ fried foods/ egg yolks/ fatty breakfast meats/ butter, cheese and high fat dairy/ and shellfish   Keep eating regular meals Follow up in 6 months for an annual exam with labs prior in about 6 months  Add 1000 iu vitamin D3 over the counter once daily

## 2012-07-13 ENCOUNTER — Encounter: Payer: Self-pay | Admitting: *Deleted

## 2012-08-04 ENCOUNTER — Other Ambulatory Visit: Payer: Self-pay | Admitting: Family Medicine

## 2012-08-28 ENCOUNTER — Other Ambulatory Visit: Payer: Self-pay | Admitting: Family Medicine

## 2012-09-10 ENCOUNTER — Other Ambulatory Visit: Payer: Self-pay

## 2012-10-06 ENCOUNTER — Ambulatory Visit
Admission: RE | Admit: 2012-10-06 | Discharge: 2012-10-06 | Disposition: A | Discharge status: Home / Self Care | Payer: Medicare Other | Source: Ambulatory Visit

## 2012-10-06 DIAGNOSIS — Z1231 Encounter for screening mammogram for malignant neoplasm of breast: Secondary | ICD-10-CM

## 2012-10-07 ENCOUNTER — Encounter: Payer: Self-pay | Admitting: *Deleted

## 2013-01-03 ENCOUNTER — Telehealth: Payer: Self-pay | Admitting: Family Medicine

## 2013-01-03 DIAGNOSIS — E559 Vitamin D deficiency, unspecified: Secondary | ICD-10-CM

## 2013-01-03 DIAGNOSIS — M810 Age-related osteoporosis without current pathological fracture: Secondary | ICD-10-CM

## 2013-01-03 DIAGNOSIS — I10 Essential (primary) hypertension: Secondary | ICD-10-CM

## 2013-01-03 DIAGNOSIS — E7800 Pure hypercholesterolemia, unspecified: Secondary | ICD-10-CM

## 2013-01-03 NOTE — Telephone Encounter (Signed)
Message copied by Judy Pimple on Sun Jan 03, 2013  8:53 PM ------      Message from: Alvina Chou      Created: Tue Dec 29, 2012  2:09 PM      Regarding: Lab orders for Monday, 7.7.14       Patient is scheduled for CPX labs, please order future labs, Thanks , Terri       ------

## 2013-01-04 ENCOUNTER — Other Ambulatory Visit (INDEPENDENT_AMBULATORY_CARE_PROVIDER_SITE_OTHER): Payer: Medicare Other

## 2013-01-04 DIAGNOSIS — E559 Vitamin D deficiency, unspecified: Secondary | ICD-10-CM | POA: Diagnosis not present

## 2013-01-04 DIAGNOSIS — M810 Age-related osteoporosis without current pathological fracture: Secondary | ICD-10-CM | POA: Diagnosis not present

## 2013-01-04 DIAGNOSIS — I10 Essential (primary) hypertension: Secondary | ICD-10-CM | POA: Diagnosis not present

## 2013-01-04 DIAGNOSIS — E7800 Pure hypercholesterolemia, unspecified: Secondary | ICD-10-CM | POA: Diagnosis not present

## 2013-01-04 LAB — COMPREHENSIVE METABOLIC PANEL
AST: 21 U/L (ref 0–37)
Alkaline Phosphatase: 48 U/L (ref 39–117)
BUN: 17 mg/dL (ref 6–23)
Glucose, Bld: 92 mg/dL (ref 70–99)
Sodium: 141 mEq/L (ref 135–145)
Total Bilirubin: 0.6 mg/dL (ref 0.3–1.2)
Total Protein: 7.2 g/dL (ref 6.0–8.3)

## 2013-01-04 LAB — CBC WITH DIFFERENTIAL/PLATELET
Eosinophils Absolute: 0.1 10*3/uL (ref 0.0–0.7)
Eosinophils Relative: 3 % (ref 0.0–5.0)
HCT: 40 % (ref 36.0–46.0)
Lymphs Abs: 1.5 10*3/uL (ref 0.7–4.0)
MCHC: 33.3 g/dL (ref 30.0–36.0)
MCV: 91.2 fl (ref 78.0–100.0)
Monocytes Absolute: 0.3 10*3/uL (ref 0.1–1.0)
Neutrophils Relative %: 55.2 % (ref 43.0–77.0)
Platelets: 180 10*3/uL (ref 150.0–400.0)
WBC: 4.5 10*3/uL (ref 4.5–10.5)

## 2013-01-04 LAB — LIPID PANEL
Cholesterol: 288 mg/dL — ABNORMAL HIGH (ref 0–200)
Total CHOL/HDL Ratio: 5
Triglycerides: 173 mg/dL — ABNORMAL HIGH (ref 0.0–149.0)
VLDL: 34.6 mg/dL (ref 0.0–40.0)

## 2013-01-05 LAB — TSH: TSH: 1.98 u[IU]/mL (ref 0.35–5.50)

## 2013-01-08 ENCOUNTER — Ambulatory Visit (INDEPENDENT_AMBULATORY_CARE_PROVIDER_SITE_OTHER): Payer: Medicare Other | Admitting: Family Medicine

## 2013-01-08 ENCOUNTER — Encounter: Payer: Self-pay | Admitting: Family Medicine

## 2013-01-08 VITALS — BP 132/86 | HR 59 | Temp 97.9°F | Ht 60.0 in | Wt 151.5 lb

## 2013-01-08 DIAGNOSIS — E7800 Pure hypercholesterolemia, unspecified: Secondary | ICD-10-CM

## 2013-01-08 DIAGNOSIS — E559 Vitamin D deficiency, unspecified: Secondary | ICD-10-CM

## 2013-01-08 DIAGNOSIS — I10 Essential (primary) hypertension: Secondary | ICD-10-CM | POA: Diagnosis not present

## 2013-01-08 DIAGNOSIS — M810 Age-related osteoporosis without current pathological fracture: Secondary | ICD-10-CM | POA: Diagnosis not present

## 2013-01-08 DIAGNOSIS — Z0000 Encounter for general adult medical examination without abnormal findings: Secondary | ICD-10-CM | POA: Diagnosis not present

## 2013-01-08 MED ORDER — LISINOPRIL 10 MG PO TABS: 10.0000 mg | ORAL_TABLET | Freq: Every day | ORAL | Status: DC

## 2013-01-08 NOTE — Patient Instructions (Addendum)
Don't forget to make sure you have a living will (advanced directive) Avoid red meat/ fried foods/ egg yolks/ fatty breakfast meats/ butter, cheese and high fat dairy/ and shellfish   Stay active Take care of yourself

## 2013-01-08 NOTE — Progress Notes (Signed)
Subjective:    Patient ID: Wendy Krueger, female    DOB: 27-Nov-1929, 79 y.o.   MRN: [id number]  HPI I have personally reviewed the Medicare Annual Wellness questionnaire and have noted 1. The patient's medical and social history 2. Their use of alcohol, tobacco or illicit drugs 3. Their current medications and supplements 4. The patient's functional ability including ADL's, fall risks, home safety risks and hearing or visual             impairment. 5. Diet and physical activities 6. Evidence for depression or mood disorders  The patients weight, height, BMI have been recorded in the chart and visual acuity is per eye clinic.  I have made referrals, counseling and provided education to the patient based review of the above and I have provided the pt with a written personalized care plan for preventive services.  Wt is stable bmi 29   See scanned forms.  Routine anticipatory guidance given to patient.  See health maintenance. Flu 1/14 vaccine  Shingles- has not had the vaccine / is not interested  PNA 12/12 vaccine  Tetanus 2/06 - up to date on vaccine  Colonoscopy- 20 y ago - is "too old" for another  Breast cancer screening- mammogram in April nl  Self exam is normal / no lumps Gyn -no problems- had a hysterectomy  Advance directive- does not have a living will - she had an appt with attourney at one pt - will re visit that now  Cognitive function addressed- see scanned forms- and if abnormal then additional documentation follows.  No major problems - family notices some forgetting of names (no red flags)  PMH and SH reviewed  Meds, vitals, and allergies reviewed.   ROS: See HPI.  Otherwise negative.     Falls- no recent falls- last one was over a year ago- got foot tangled in bed  No fx in the past year  Family has taken all the rugs up   OP dexa 2010 Failed fosamax, failed miacalcin  Cannot take the px vit D  Is taking her otc vit D daily -- level is 37 now  -finally in the normal range  She chooses to stop following this and does not need other medicine  She stays very active  Goes to bible school and dances with the children  Mood - is overall good/ no worries about depression / lots of socializing/ stays busy and very motivated   Labs are ok  Lab Results  Component Value Date   CHOL 288* 01/04/2013   HDL 55.20 01/04/2013   LDLCALC 66 01/06/2011   LDLDIRECT 197.4 01/04/2013   TRIG 173.0* 01/04/2013   CHOLHDL 5 01/04/2013   LDL is high- aware - her diet is fair and she eats what she wants  She cannot tolerate statins  Some sausage and bacon   bp is stable today  No cp or palpitations or headaches or edema  No side effects to medicines  BP Readings from Last 3 Encounters:  01/08/13 132/86  07/10/12 150/88  01/07/12 126/72     Patient Active Problem List   Diagnosis Date Noted  . Encounter for Medicare annual wellness exam 01/08/2013  . Colitis 11/11/2011  . Gallstones 11/11/2011  . 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 Left hand weakness 06/04/2011  . Risk for falls 06/04/2011  . Constipation 03/05/2011  . Leg pain, bilateral 01/16/2011  . UNSPECIFIED VITAMIN D DEFICIENCY 03/28/2009  . HYPERCHOLESTEROLEMIA, PURE 03/17/2007  . HYPERTENSION  03/16/2007  . OSTEOARTHRITIS 03/16/2007  . OSTEOPOROSIS 03/16/2007   Past Medical History  Diagnosis Date  . Hypertension   . Osteoarthritis   . Osteoporosis   . Hyperlipidemia   . Colitis - presumed infectious origin     one ER visit   . Asymptomatic gallstones    Past Surgical History  Procedure Laterality Date  . Abdominal hysterectomy      BSO- fibroids  . Wrist fracture surgery  2/01    R arm  . Abi's      normal  . Left foot brace    . Dexa  1/05    osteoporosis   History  Substance Use Topics  . Smoking status: Never Smoker   . Smokeless tobacco: Never Used  . Alcohol Use: No   Family History  Problem Relation Age of Onset  . Heart attack Father   .  Hypertension Father   . Heart attack Mother   . Heart attack Father   . Throat cancer Brother    Allergies  Allergen Reactions  . Alendronate Sodium     Let pain   . Amlodipine Besylate Hives  . Calcitonin ([place]) Other (See Comments)     headache/ head pressure  . Simvastatin Other (See Comments)    Let pain   . Vitamin D Nausea And Vomiting   Current Outpatient Prescriptions on File Prior to Visit  Medication Sig Dispense Refill  . aspirin 81 MG chewable tablet Chew 81 mg by mouth daily.      . cyanocobalamin ([facility]S VITAMIN B12) 1000 MCG tablet Take 1 tablet (1,000 mcg total) by mouth daily.  30 tablet  11  . docusate sodium (COLACE) 100 MG capsule Take 100 mg by mouth 2 (two) times daily.       Marland Kitchen lisinopril (PRINIVIL,ZESTRIL) 10 MG tablet TAKE 1 TABLET EVERY DAY  30 tablet  4  . metoprolol (LOPRESSOR) 50 MG tablet Take 1 tablet (50 mg total) by mouth 2 (two) times daily.  60 tablet  11  . Multiple Vitamins-Minerals (CVS SPECTRAVITE ADULT 50+) TABS TAKE 1 TABLET BY MOUTH EVERY DAY  60 tablet  6  . potassium chloride (K-DUR,KLOR-CON) 10 MEQ tablet Take 2 tablets (20 mEq total) by mouth daily.  60 tablet  11  . meclizine (ANTIVERT) 25 MG tablet TAKE 1 TABLET BY MOUTH 3 TIMES A DAY AS NEEDED FOR DIZZINESS  30 tablet  0   No current facility-administered medications on file prior to visit.      Review of Systems Review of Systems  Constitutional: Negative for fever, appetite change, fatigue and unexpected weight change.  Eyes: Negative for pain and visual disturbance.  Respiratory: Negative for cough and shortness of breath.   Cardiovascular: Negative for cp or palpitations    Gastrointestinal: Negative for nausea, diarrhea and constipation.  Genitourinary: Negative for urgency and frequency.  Skin: Negative for pallor or rash   MSK pos for aches and pains from oa  Neurological: Negative for weakness, light-headedness, numbness and headaches.  Hematological: Negative for  adenopathy. Does not bruise/bleed easily.  Psychiatric/Behavioral: Negative for dysphoric mood. The patient is not nervous/anxious.         Objective:   Physical Exam  Constitutional: She appears well-developed and well-nourished. No distress.  HENT:  Head: Normocephalic and atraumatic.  Right Ear: External ear normal.  Left Ear: External ear normal.  Nose: Nose normal.  Mouth/Throat: No oropharyngeal exudate.  Eyes: Conjunctivae and EOM are normal. Pupils are equal, round,  and reactive to light. No scleral icterus.  Neck: Normal range of motion. Neck supple. No JVD present. Carotid bruit is not present. No thyromegaly present.  Cardiovascular: Normal rate, regular rhythm and intact distal pulses.  Exam reveals no gallop.   Pulmonary/Chest: Effort normal and breath sounds normal. No respiratory distress. She has no wheezes. She has no rales.  No crackles  Abdominal: Soft. Bowel sounds are normal. She exhibits no distension, no abdominal bruit and no mass. There is no tenderness.  Musculoskeletal: She exhibits no edema.  Lymphadenopathy:    She has no cervical adenopathy.  Neurological: She is alert. She has normal reflexes. No cranial nerve deficit. She exhibits normal muscle tone. Coordination normal.  Skin: Skin is warm and dry. No rash noted. No erythema. No pallor.  Psychiatric: She has a normal mood and affect.          Assessment & Plan:

## 2013-01-10 NOTE — Assessment & Plan Note (Signed)
bp in fair control at this time  No changes needed  Disc lifstyle change with low sodium diet and exercise   

## 2013-01-10 NOTE — Assessment & Plan Note (Signed)
Reviewed health habits including diet and exercise and skin cancer prevention Also reviewed health mt list, fam hx and immunizations  See HPI Rev labs in detail

## 2013-01-10 NOTE — Assessment & Plan Note (Signed)
Watching Disc goals for lipids and reasons to control them Rev labs with pt Rev low sat fat diet in detail Adv rxn to statin and pt declines further med at her age

## 2013-01-10 NOTE — Assessment & Plan Note (Signed)
Pt has been intol to several meds and also no fx or recent falls Disc imp of ca and D and disc fall risk/ safety in detail

## 2013-01-10 NOTE — Assessment & Plan Note (Signed)
D level is tx on current dose Disc imp to bone and overall health 

## 2013-03-06 DIAGNOSIS — H2510 Age-related nuclear cataract, unspecified eye: Secondary | ICD-10-CM | POA: Diagnosis not present

## 2013-04-10 ENCOUNTER — Other Ambulatory Visit: Payer: Self-pay | Admitting: Family Medicine

## 2013-04-24 ENCOUNTER — Other Ambulatory Visit: Payer: Self-pay | Admitting: Family Medicine

## 2013-06-26 ENCOUNTER — Other Ambulatory Visit: Payer: Self-pay | Admitting: Family Medicine

## 2013-07-12 ENCOUNTER — Other Ambulatory Visit: Payer: Self-pay | Admitting: Family Medicine

## 2013-08-05 ENCOUNTER — Other Ambulatory Visit: Payer: Self-pay | Admitting: Family Medicine

## 2013-08-23 ENCOUNTER — Telehealth: Payer: Self-pay | Admitting: Family Medicine

## 2013-08-23 NOTE — Telephone Encounter (Signed)
Aware will make sure she speaks with Dr. Glori Bickers

## 2013-08-23 NOTE — Telephone Encounter (Signed)
Pt is scheduled for an office visit tomorrow and her daughter is wanting to talk to Dr. Glori Bickers by herself sometime during the appointment because she thinks her mother may have Dementia and she says her mother gets very emotional about it when she brings it up. Please advise

## 2013-08-24 ENCOUNTER — Ambulatory Visit: Payer: Medicare Other | Admitting: Family Medicine

## 2013-09-01 ENCOUNTER — Ambulatory Visit (INDEPENDENT_AMBULATORY_CARE_PROVIDER_SITE_OTHER): Payer: Medicare Other | Admitting: Family Medicine

## 2013-09-01 ENCOUNTER — Telehealth: Payer: Self-pay | Admitting: Family Medicine

## 2013-09-01 ENCOUNTER — Encounter: Payer: Self-pay | Admitting: Family Medicine

## 2013-09-01 VITALS — BP 130/78 | HR 58 | Temp 98.6°F | Ht 60.0 in | Wt 153.2 lb

## 2013-09-01 DIAGNOSIS — I10 Essential (primary) hypertension: Secondary | ICD-10-CM | POA: Diagnosis not present

## 2013-09-01 DIAGNOSIS — R413 Other amnesia: Secondary | ICD-10-CM

## 2013-09-01 DIAGNOSIS — F411 Generalized anxiety disorder: Secondary | ICD-10-CM | POA: Diagnosis not present

## 2013-09-01 DIAGNOSIS — F0390 Unspecified dementia without behavioral disturbance: Secondary | ICD-10-CM

## 2013-09-01 DIAGNOSIS — F419 Anxiety disorder, unspecified: Secondary | ICD-10-CM | POA: Insufficient documentation

## 2013-09-01 LAB — COMPREHENSIVE METABOLIC PANEL
ALBUMIN: 4 g/dL (ref 3.5–5.2)
ALT: 12 U/L (ref 0–35)
AST: 18 U/L (ref 0–37)
Alkaline Phosphatase: 53 U/L (ref 39–117)
BUN: 15 mg/dL (ref 6–23)
CALCIUM: 9.7 mg/dL (ref 8.4–10.5)
CO2: 25 mEq/L (ref 19–32)
CREATININE: 1.1 mg/dL (ref 0.4–1.2)
Chloride: 107 mEq/L (ref 96–112)
GFR: 50.56 mL/min — AB (ref >60.00)
GLUCOSE: 77 mg/dL (ref 70–99)
Potassium: 4.6 mEq/L (ref 3.5–5.1)
Sodium: 141 mEq/L (ref 135–145)
Total Bilirubin: 0.5 mg/dL (ref 0.3–1.2)
Total Protein: 6.9 g/dL (ref 6.0–8.3)

## 2013-09-01 LAB — TSH: TSH: 2.01 u[IU]/mL (ref 0.35–5.50)

## 2013-09-01 LAB — SEDIMENTATION RATE: Sed Rate: 19 mm/hr (ref 0–22)

## 2013-09-01 MED ORDER — DONEPEZIL HCL 10 MG PO TABS: 5.0000 mg | ORAL_TABLET | Freq: Every day | ORAL | Status: DC

## 2013-09-01 NOTE — Telephone Encounter (Signed)
Relevant patient education mailed to patient.  

## 2013-09-01 NOTE — Patient Instructions (Addendum)
Labs today Start aricept 5 mg at bedtime (1/2 of a 10 mg) If side effects like nausea-stop it and let me know  This medicine is designed to slow down age related memory loss Have your family work on safety issues in the home  Keep brain and body active and stay as social as possible also   Follow up in about a month for 30 minute visit

## 2013-09-01 NOTE — Progress Notes (Signed)
Subjective:    Patient ID: Wendy Krueger, female    DOB: 02/18/1930, 80 y.o.   MRN: [id number]  HPI Having trouble with memory- and tearfulness Per family  Forgets names of people and items Leaves stove on /oven and tries to put plastic in the oven  Can't remember how to use microwave or can opener Easily confused  Agitated at times -gets  She cannnot remember details - poss confabulates  Afraid at night- and wants someone there   Pt herself notices  She cannot give details  Has misplaced items  She left the stove on the other day- she got distracted by that  occ recognizes that she does not getting things done  Does forget names   Realizes that she cries frequently - she gets upset when someone reminds her she forgot something  Feels persecuted at times  Wants everyone to realize she is not perfect  Missed her grandson who moved away to [place]- for work  No other big life changes   Appetite is ok - (she thinks she eats too much) Sleeps well  No night time strange behavior   She enjoys doing her housework and Therapist, music to read newspaper    Still does her housework and grooming    Since husb died and son left-this is the first time she has been alone  Alone during the day/ daughter is there at night    [name] and [name] live with her)  She is very suspicous and paranoid   Also just seems down in general mood wise  Patient Active Problem List   Diagnosis Date Noted  . Encounter for Medicare annual wellness exam 01/08/2013  . Colitis 11/11/2011  . Gallstones 11/11/2011  . 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 Left hand weakness 06/04/2011  . Risk for falls 06/04/2011  . Constipation 03/05/2011  . Leg pain, bilateral 01/16/2011  . UNSPECIFIED VITAMIN D DEFICIENCY 03/28/2009  . HYPERCHOLESTEROLEMIA, PURE 03/17/2007  . HYPERTENSION 03/16/2007  . OSTEOARTHRITIS 03/16/2007  . OSTEOPOROSIS 03/16/2007   Past Medical History    Diagnosis Date  . Hypertension   . Osteoarthritis   . Osteoporosis   . Hyperlipidemia   . Colitis - presumed infectious origin     one ER visit   . Asymptomatic gallstones    Past Surgical History  Procedure Laterality Date  . Abdominal hysterectomy      BSO- fibroids  . Wrist fracture surgery  2/01    R arm  . Abi's      normal  . Left foot brace    . Dexa  1/05    osteoporosis   History  Substance Use Topics  . Smoking status: Never Smoker   . Smokeless tobacco: Never Used  . Alcohol Use: No   Family History  Problem Relation Age of Onset  . Heart attack Father   . Hypertension Father   . Heart attack Mother   . Heart attack Father   . Throat cancer Brother    Allergies  Allergen Reactions  . Alendronate Sodium     Let pain   . Amlodipine Besylate Hives  . Calcitonin ([place]) Other (See Comments)     headache/ head pressure  . Simvastatin Other (See Comments)    Let pain   . Vitamin D Nausea And Vomiting   Current Outpatient Prescriptions on File Prior to Visit  Medication Sig Dispense Refill  . aspirin 81 MG chewable tablet Chew 81 mg by mouth daily.      Marland Kitchen  cholecalciferol (VITAMIN D) 1000 UNITS tablet Take 1,000 Units by mouth daily.      . [facility]S VITAMIN B12 1000 MCG tablet TAKE 1 TABLET (1,000 MCG TOTAL) BY MOUTH DAILY.  30 tablet  6  . docusate sodium (COLACE) 100 MG capsule Take 100 mg by mouth 2 (two) times daily.       Marland Kitchen KLOR-CON 10 10 MEQ tablet TAKE 2 TABLETS (20 MEQ TOTAL) BY MOUTH DAILY.  60 tablet  5  . lisinopril (PRINIVIL,ZESTRIL) 10 MG tablet Take 1 tablet (10 mg total) by mouth daily.  30 tablet  11  . metoprolol (LOPRESSOR) 50 MG tablet TAKE 1 TABLET BY MOUTH TWICE A DAY  60 tablet  6  . Multiple Vitamins-Minerals ([facility]S SPECTRAVITE SENIOR) TABS TAKE 1 TABLET BY MOUTH EVERY DAY  125 tablet  2  . potassium chloride (K-DUR,KLOR-CON) 10 MEQ tablet Take 2 tablets (20 mEq total) by mouth daily.  60 tablet  11  . meclizine (ANTIVERT) 25 MG tablet  TAKE 1 TABLET BY MOUTH 3 TIMES A DAY AS NEEDED FOR DIZZINESS  30 tablet  0   No current facility-administered medications on file prior to visit.     Review of Systems Review of Systems  Constitutional: Negative for fever, appetite change, fatigue and unexpected weight change.  Eyes: Negative for pain and visual disturbance.  Respiratory: Negative for cough and shortness of breath.   Cardiovascular: Negative for cp or palpitations    Gastrointestinal: Negative for nausea, diarrhea and constipation.  Genitourinary: Negative for urgency and frequency.  Skin: Negative for pallor or rash   Neurological: Negative for weakness, light-headedness, numbness and headaches.  Hematological: Negative for adenopathy. Does not bruise/bleed easily.  Psychiatric/Behavioral: Negative for dysphoric mood. The patient is  nervous/anxious.  pos for memory loss and agitation        Objective:   Physical Exam  Constitutional: She appears well-developed and well-nourished. No distress.  HENT:  Head: Normocephalic and atraumatic.  Mouth/Throat: Oropharynx is clear and moist.  Eyes: Conjunctivae and EOM are normal. Pupils are equal, round, and reactive to light. No scleral icterus.  Neck: Neck supple. No JVD present. Carotid bruit is not present. No thyromegaly present.  Cardiovascular: Normal rate, regular rhythm and intact distal pulses.  Exam reveals no gallop.   Pulmonary/Chest: Effort normal and breath sounds normal. No respiratory distress. She has no wheezes. She has no rales.  Musculoskeletal: She exhibits no edema.  Lymphadenopathy:    She has no cervical adenopathy.  Neurological: She is alert. She has normal reflexes. No cranial nerve deficit. She exhibits normal muscle tone. Coordination and gait normal.  Skin: Skin is warm and dry. No rash noted. No pallor.  Psychiatric: Her speech is normal and behavior is normal. Her mood appears anxious. Cognition and memory are impaired. She exhibits a  depressed mood.  Pt tearful today when disc memory problems and stressors  Not overtly paranoid or agitated at her visit Supportive family is present           Assessment & Plan:

## 2013-09-01 NOTE — Progress Notes (Signed)
Pre visit review using our clinic review tool, if applicable. No additional management support is needed unless otherwise documented below in the visit note. 

## 2013-09-02 NOTE — Assessment & Plan Note (Signed)
Fairly classic symptoms of early to moderate dementia - with some features of cognitive slowing and paranoia per hx  Also some dep/ anx and social change could aggrivate condition Long disc with pt and family  Will do labs today for memory loss  Start aricept at 5 mg and if tolerated adv to 10 at next visit-disc side eff in detail  F/u planned for further review and also mms exam  Issue of safety disc in great detail   >25 minutes spent in face to face time with patient, >50% spent in counselling or coordination of care

## 2013-09-02 NOTE — Assessment & Plan Note (Signed)
BP: 130/78 mmHg  bp in fair control at this time  No changes needed Disc lifstyle change with low sodium diet and exercise

## 2013-09-06 ENCOUNTER — Encounter: Payer: Self-pay | Admitting: *Deleted

## 2013-09-07 ENCOUNTER — Other Ambulatory Visit: Payer: Self-pay

## 2013-09-07 DIAGNOSIS — Z1231 Encounter for screening mammogram for malignant neoplasm of breast: Secondary | ICD-10-CM

## 2013-10-11 ENCOUNTER — Encounter: Payer: Self-pay | Admitting: Family Medicine

## 2013-10-11 ENCOUNTER — Ambulatory Visit (INDEPENDENT_AMBULATORY_CARE_PROVIDER_SITE_OTHER): Payer: Medicare Other | Admitting: Family Medicine

## 2013-10-11 VITALS — BP 138/80 | HR 57 | Temp 97.6°F | Ht 60.0 in | Wt 148.5 lb

## 2013-10-11 DIAGNOSIS — F0390 Unspecified dementia without behavioral disturbance: Secondary | ICD-10-CM

## 2013-10-11 MED ORDER — DONEPEZIL HCL 10 MG PO TABS: 10.0000 mg | ORAL_TABLET | Freq: Every day | ORAL | Status: DC

## 2013-10-11 NOTE — Assessment & Plan Note (Signed)
Likely age rel and worsened by mood lability  Overall improved from last visit-and pt is open to disc her symptoms now MMS score 22/30 with fair clock draw  Will inc aricept from 5 to 10 mg daily as tolerated F/u upcoming for health mt   Enc word puzzles and reading and socialization

## 2013-10-11 NOTE — Patient Instructions (Signed)
Increase aricept to 10 mg once daily Update me if any side effects or problems Glad you are doing better

## 2013-10-11 NOTE — Progress Notes (Signed)
Subjective:    Patient ID: Wendy Krueger, female    DOB: 04-Jan-1930, 80 y.o.   MRN: [id number]  HPI Here for f/u of dementia Last visit started her on aricept 5 mg  No nausea   Wt is down 5 lb  She eats smaller portions now   Still forgetful and still confabulates a lot  She was aggravated after last visit - due to memory problem   MMS test today :   She reads newspaper every day  Still gets sad at times  Getting used to her son and husb being gone  The winter was hard on her Overall better about that - gets tearful easily but it does not last long at all   Sleeps very well at night   Socially- goes to church every Sunday and goes out for church activity  Family visits/checks in daily  She enjoys shopping for short periods at time  Gets outdoors on swing and porch whenever she can- in and out  Has a dog - that is good company for her and keeps her moving   No safety concerns or falls or problems from last visit    MMS exam today- 22/30 score  Concentration /cognition seems less problematic than short term memory Clock draw was fair   Patient Active Problem List   Diagnosis Date Noted  . Dementia 09/01/2013  . Memory loss 09/01/2013  . Anxiety 09/01/2013  . Encounter for Medicare annual wellness exam 01/08/2013  . Colitis 11/11/2011  . Gallstones 11/11/2011  . 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 Left hand weakness 06/04/2011  . Risk for falls 06/04/2011  . Constipation 03/05/2011  . Leg pain, bilateral 01/16/2011  . UNSPECIFIED VITAMIN D DEFICIENCY 03/28/2009  . HYPERCHOLESTEROLEMIA, PURE 03/17/2007  . HYPERTENSION 03/16/2007  . OSTEOARTHRITIS 03/16/2007  . OSTEOPOROSIS 03/16/2007   Past Medical History  Diagnosis Date  . Hypertension   . Osteoarthritis   . Osteoporosis   . Hyperlipidemia   . Colitis - presumed infectious origin     one ER visit   . Asymptomatic gallstones    Past Surgical History  Procedure Laterality Date  .  Abdominal hysterectomy      BSO- fibroids  . Wrist fracture surgery  2/01    R arm  . Abi's      normal  . Left foot brace    . Dexa  1/05    osteoporosis   History  Substance Use Topics  . Smoking status: Never Smoker   . Smokeless tobacco: Never Used  . Alcohol Use: No   Family History  Problem Relation Age of Onset  . Heart attack Father   . Hypertension Father   . Heart attack Mother   . Heart attack Father   . Throat cancer Brother    Allergies  Allergen Reactions  . Alendronate Sodium     Let pain   . Amlodipine Besylate Hives  . Calcitonin ([place]) Other (See Comments)     headache/ head pressure  . Simvastatin Other (See Comments)    Let pain   . Vitamin D Nausea And Vomiting   Current Outpatient Prescriptions on File Prior to Visit  Medication Sig Dispense Refill  . aspirin 81 MG chewable tablet Chew 81 mg by mouth daily.      . cholecalciferol (VITAMIN D) 1000 UNITS tablet Take 1,000 Units by mouth daily.      . [facility]S VITAMIN B12 1000 MCG tablet TAKE 1 TABLET (1,000  MCG TOTAL) BY MOUTH DAILY.  30 tablet  6  . docusate sodium (COLACE) 100 MG capsule Take 100 mg by mouth 2 (two) times daily.       Marland Kitchen KLOR-CON 10 10 MEQ tablet TAKE 2 TABLETS (20 MEQ TOTAL) BY MOUTH DAILY.  60 tablet  5  . lisinopril (PRINIVIL,ZESTRIL) 10 MG tablet Take 1 tablet (10 mg total) by mouth daily.  30 tablet  11  . meclizine (ANTIVERT) 25 MG tablet TAKE 1 TABLET BY MOUTH 3 TIMES A DAY AS NEEDED FOR DIZZINESS  30 tablet  0  . metoprolol (LOPRESSOR) 50 MG tablet TAKE 1 TABLET BY MOUTH TWICE A DAY  60 tablet  6  . Multiple Vitamins-Minerals (CVS SPECTRAVITE SENIOR) TABS TAKE 1 TABLET BY MOUTH EVERY DAY  125 tablet  2  . potassium chloride (K-DUR,KLOR-CON) 10 MEQ tablet Take 2 tablets (20 mEq total) by mouth daily.  60 tablet  11   No current facility-administered medications on file prior to visit.      Review of Systems Review of Systems  Constitutional: Negative for fever, appetite  change, fatigue and unexpected weight change.  Eyes: Negative for pain and visual disturbance.  Respiratory: Negative for cough and shortness of breath.   Cardiovascular: Negative for cp or palpitations    Gastrointestinal: Negative for nausea, diarrhea and constipation.  Genitourinary: Negative for urgency and frequency.  Skin: Negative for pallor or rash   Neurological: Negative for weakness, light-headedness, numbness and headaches.  Hematological: Negative for adenopathy. Does not bruise/bleed easily.  Psychiatric/Behavioral: pos for occ tearfulness that is improved/ pos for memory loss        Objective:   Physical Exam  Constitutional: She appears well-developed and well-nourished. No distress.  Supportive family present   HENT:  Head: Normocephalic and atraumatic.  Eyes: Conjunctivae and EOM are normal. Pupils are equal, round, and reactive to light. No scleral icterus.  Neck: Normal range of motion. Neck supple.  Cardiovascular: Normal rate and regular rhythm.   Pulmonary/Chest: No respiratory distress. She has no wheezes. She has no rales.  Neurological: She is alert. She has normal reflexes. No cranial nerve deficit. She exhibits normal muscle tone. Coordination normal.  Skin: Skin is warm and dry. No rash noted. No erythema. No pallor.  Psychiatric:  Emotionally labile-occ tearfull when disc losses  MMS score 22/30 Clock draw is fair            Assessment & Plan:

## 2013-10-11 NOTE — Progress Notes (Signed)
Pre visit review using our clinic review tool, if applicable. No additional management support is needed unless otherwise documented below in the visit note. 

## 2013-10-12 ENCOUNTER — Ambulatory Visit: Payer: Medicare Other

## 2013-10-26 ENCOUNTER — Ambulatory Visit
Admission: RE | Admit: 2013-10-26 | Discharge: 2013-10-26 | Disposition: A | Discharge status: Home / Self Care | Payer: Medicare Other | Source: Ambulatory Visit

## 2013-10-26 DIAGNOSIS — Z1231 Encounter for screening mammogram for malignant neoplasm of breast: Secondary | ICD-10-CM | POA: Diagnosis not present

## 2013-10-28 ENCOUNTER — Encounter: Payer: Self-pay | Admitting: *Deleted

## 2013-11-25 ENCOUNTER — Ambulatory Visit (INDEPENDENT_AMBULATORY_CARE_PROVIDER_SITE_OTHER): Payer: Medicare Other | Admitting: Internal Medicine

## 2013-11-25 ENCOUNTER — Encounter: Payer: Self-pay | Admitting: Internal Medicine

## 2013-11-25 VITALS — BP 128/88 | HR 76 | Temp 98.2°F | Wt 148.0 lb

## 2013-11-25 DIAGNOSIS — Y92009 Unspecified place in unspecified non-institutional (private) residence as the place of occurrence of the external cause: Secondary | ICD-10-CM

## 2013-11-25 DIAGNOSIS — W19XXXA Unspecified fall, initial encounter: Secondary | ICD-10-CM

## 2013-11-25 DIAGNOSIS — R109 Unspecified abdominal pain: Secondary | ICD-10-CM

## 2013-11-25 NOTE — Patient Instructions (Addendum)

## 2013-11-25 NOTE — Progress Notes (Signed)
Pre visit review using our clinic review tool, if applicable. No additional management support is needed unless otherwise documented below in the visit note. 

## 2013-11-25 NOTE — Progress Notes (Signed)
Subjective:    Patient ID: Wendy Krueger, female    DOB: 1930/02/18, 80 y.o.   MRN: [id number]  HPI  Pt presents to the clinic today s/p fall in her driveway. This occurred 2 days ago. She was raking gravel in her driveway when she fell down on her left side. She did not hit her head. It was very sore yesterday. She was unable to bear weight on her left leg today. The pain has improved and she is able to bear weight today. She denies chest pain, difficulty breathing, cough, blood in her urine. She is on aspirin daily. She did just want to come get it checked out.  Review of Systems      Past Medical History  Diagnosis Date  . Hypertension   . Osteoarthritis   . Osteoporosis   . Hyperlipidemia   . Colitis - presumed infectious origin     one ER visit   . Asymptomatic gallstones     Current Outpatient Prescriptions  Medication Sig Dispense Refill  . aspirin 81 MG chewable tablet Chew 81 mg by mouth daily.      . cholecalciferol (VITAMIN D) 1000 UNITS tablet Take 1,000 Units by mouth daily.      . [facility]S VITAMIN B12 1000 MCG tablet TAKE 1 TABLET (1,000 MCG TOTAL) BY MOUTH DAILY.  30 tablet  6  . docusate sodium (COLACE) 100 MG capsule Take 100 mg by mouth 2 (two) times daily.       Marland Kitchen donepezil (ARICEPT) 10 MG tablet Take 1 tablet (10 mg total) by mouth at bedtime.  30 tablet  11  . KLOR-CON 10 10 MEQ tablet TAKE 2 TABLETS (20 MEQ TOTAL) BY MOUTH DAILY.  60 tablet  5  . lisinopril (PRINIVIL,ZESTRIL) 10 MG tablet Take 1 tablet (10 mg total) by mouth daily.  30 tablet  11  . meclizine (ANTIVERT) 25 MG tablet TAKE 1 TABLET BY MOUTH 3 TIMES A DAY AS NEEDED FOR DIZZINESS  30 tablet  0  . metoprolol (LOPRESSOR) 50 MG tablet TAKE 1 TABLET BY MOUTH TWICE A DAY  60 tablet  6  . Multiple Vitamins-Minerals (CVS SPECTRAVITE SENIOR) TABS TAKE 1 TABLET BY MOUTH EVERY DAY  125 tablet  2   No current facility-administered medications for this visit.    Allergies  Allergen Reactions  .  Alendronate Sodium     Let pain   . Amlodipine Besylate Hives  . Calcitonin ([place]) Other (See Comments)     headache/ head pressure  . Simvastatin Other (See Comments)    Let pain   . Vitamin D Nausea And Vomiting    Family History  Problem Relation Age of Onset  . Heart attack Father   . Hypertension Father   . Heart attack Mother   . Heart attack Father   . Throat cancer Brother     History   Social History  . Marital Status: Widowed    Spouse Name: N/A    Number of Children: 44  . Years of Education: N/A   Occupational History  . retired    Social History Main Topics  . Smoking status: Never Smoker   . Smokeless tobacco: Never Used  . Alcohol Use: No  . Drug Use: No  . Sexual Activity: Not on file   Other Topics Concern  . Not on file   Social History Narrative   Has 4 brothers, 1 sister   Lives with 2 teenage grand kids and her  daughter   Has 4 daughters, 1 son     Constitutional: Denies fever, malaise, fatigue, headache or abrupt weight changes.  Respiratory: Denies difficulty breathing, shortness of breath, cough or sputum production.   Cardiovascular: Denies chest pain, chest tightness, palpitations or swelling in the hands or feet.  Gastrointestinal: Denies abdominal pain, bloating, constipation, diarrhea or blood in the stool.  GU: Denies urgency, frequency, pain with urination, burning sensation, blood in urine, odor or discharge. Musculoskeletal: Pt reports pain in left lateral abdomen/flank. Denies decrease in range of motion, difficulty with gait, muscle pain or joint pain and swelling.  No other specific complaints in a complete review of systems (except as listed in HPI above).  Objective:   Physical Exam    BP 128/88  Pulse 76  Temp(Src) 98.2 F (36.8 C) (Oral)  Wt 148 lb (67.132 kg)  SpO2 98% Wt Readings from Last 3 Encounters:  11/25/13 148 lb (67.132 kg)  10/11/13 148 lb 8 oz (67.359 kg)  09/01/13 153 lb 4 oz (69.514 kg)     General: Appears her stated age, well developed, well nourished in NAD. Cardiovascular: Normal rate and rhythm. S1,S2 noted.  No murmur, rubs or gallops noted. No JVD or BLE edema. No carotid bruits noted. Pulmonary/Chest: Normal effort and positive vesicular breath sounds. No respiratory distress. No wheezes, rales or ronchi noted.  Abdomen: Soft and nontender. Normal bowel sounds, no bruits noted. No distention or masses noted. Liver, spleen and kidneys non palpable. Musculoskeletal: Decreased flexion and extension of back. Normal flexion, extension, abduction and adduction of left hip. No pain with palpation. Strength 5/5 BLE. No pain or abnormality noted with palpation of the left ribs.   BMET    Component Value Date/Time   NA 141 09/01/2013 1043   K 4.6 09/01/2013 1043   CL 107 09/01/2013 1043   CO2 25 09/01/2013 1043   GLUCOSE 77 09/01/2013 1043   BUN 15 09/01/2013 1043   CREATININE 1.1 09/01/2013 1043   CALCIUM 9.7 09/01/2013 1043   GFRNONAA 54* 11/07/2011 1133   GFRAA 63* 11/07/2011 1133    Lipid Panel     Component Value Date/Time   CHOL 288* 01/04/2013 0932   TRIG 173.0* 01/04/2013 0932   HDL 55.20 01/04/2013 0932   CHOLHDL 5 01/04/2013 0932   VLDL 34.6 01/04/2013 0932   LDLCALC 66 01/06/2011 1532    CBC    Component Value Date/Time   WBC 4.5 01/04/2013 0932   RBC 4.39 01/04/2013 0932   RBC 3.62* 01/07/2011 1859   HGB 13.4 01/04/2013 0932   HCT 40.0 01/04/2013 0932   PLT 180.0 01/04/2013 0932   MCV 91.2 01/04/2013 0932   MCH 29.6 11/07/2011 1133   MCHC 33.3 01/04/2013 0932   RDW 13.5 01/04/2013 0932   LYMPHSABS 1.5 01/04/2013 0932   MONOABS 0.3 01/04/2013 0932   EOSABS 0.1 01/04/2013 0932   BASOSABS 0.0 01/04/2013 0932    Hgb A1C No results found for this basename: HGBA1C       Assessment & Plan:   Fall at home:  I think you may just be sore from the fall Nothing appears broken No need for xrays today Try tylenol and heating pad for comfort  RTC as needed or if pain persist or worsens

## 2013-12-18 ENCOUNTER — Telehealth: Payer: Self-pay | Admitting: Family Medicine

## 2013-12-18 DIAGNOSIS — E7800 Pure hypercholesterolemia, unspecified: Secondary | ICD-10-CM

## 2013-12-18 DIAGNOSIS — E559 Vitamin D deficiency, unspecified: Secondary | ICD-10-CM

## 2013-12-18 DIAGNOSIS — I10 Essential (primary) hypertension: Secondary | ICD-10-CM

## 2013-12-18 DIAGNOSIS — M810 Age-related osteoporosis without current pathological fracture: Secondary | ICD-10-CM

## 2013-12-18 NOTE — Telephone Encounter (Signed)
Message copied by Abner Greenspan on Sat Dec 18, 2013  5:12 PM ------      Message from: Ellamae Sia      Created: Tue Dec 14, 2013  4:43 PM      Regarding: lab orders       Patient is scheduled for CPX labs, please order future labs, Thanks , Terri       ------

## 2014-01-03 ENCOUNTER — Other Ambulatory Visit (INDEPENDENT_AMBULATORY_CARE_PROVIDER_SITE_OTHER): Payer: Medicare Other

## 2014-01-03 DIAGNOSIS — R413 Other amnesia: Secondary | ICD-10-CM

## 2014-01-03 DIAGNOSIS — E7800 Pure hypercholesterolemia, unspecified: Secondary | ICD-10-CM | POA: Diagnosis not present

## 2014-01-03 DIAGNOSIS — I10 Essential (primary) hypertension: Secondary | ICD-10-CM

## 2014-01-03 DIAGNOSIS — E559 Vitamin D deficiency, unspecified: Secondary | ICD-10-CM

## 2014-01-03 DIAGNOSIS — M810 Age-related osteoporosis without current pathological fracture: Secondary | ICD-10-CM | POA: Diagnosis not present

## 2014-01-03 LAB — COMPREHENSIVE METABOLIC PANEL
ALBUMIN: 4 g/dL (ref 3.5–5.2)
ALT: 11 U/L (ref 0–35)
AST: 21 U/L (ref 0–37)
Alkaline Phosphatase: 50 U/L (ref 39–117)
BUN: 20 mg/dL (ref 6–23)
CALCIUM: 9.8 mg/dL (ref 8.4–10.5)
CHLORIDE: 108 meq/L (ref 96–112)
CO2: 26 mEq/L (ref 19–32)
Creatinine, Ser: 1.3 mg/dL — ABNORMAL HIGH (ref 0.4–1.2)
GFR: 42.8 mL/min — ABNORMAL LOW (ref >60.00)
GLUCOSE: 86 mg/dL (ref 70–99)
POTASSIUM: 4.5 meq/L (ref 3.5–5.1)
Sodium: 142 mEq/L (ref 135–145)
TOTAL PROTEIN: 7.1 g/dL (ref 6.0–8.3)
Total Bilirubin: 0.6 mg/dL (ref 0.2–1.2)

## 2014-01-03 LAB — CBC WITH DIFFERENTIAL/PLATELET
BASOS PCT: 1 % (ref 0.0–3.0)
Basophils Absolute: 0 10*3/uL (ref 0.0–0.1)
EOS PCT: 3.7 % (ref 0.0–5.0)
Eosinophils Absolute: 0.2 10*3/uL (ref 0.0–0.7)
HCT: 39.1 % (ref 36.0–46.0)
Hemoglobin: 13.1 g/dL (ref 12.0–15.0)
LYMPHS PCT: 30.1 % (ref 12.0–46.0)
Lymphs Abs: 1.3 10*3/uL (ref 0.7–4.0)
MCHC: 33.4 g/dL (ref 30.0–36.0)
MCV: 89.9 fl (ref 78.0–100.0)
Monocytes Absolute: 0.4 10*3/uL (ref 0.1–1.0)
Monocytes Relative: 8.3 % (ref 3.0–12.0)
NEUTROS PCT: 56.9 % (ref 43.0–77.0)
Neutro Abs: 2.5 10*3/uL (ref 1.4–7.7)
Platelets: 195 10*3/uL (ref 150.0–400.0)
RBC: 4.36 Mil/uL (ref 3.87–5.11)
RDW: 13.6 % (ref 11.5–15.5)
WBC: 4.3 10*3/uL (ref 4.0–10.5)

## 2014-01-03 LAB — LIPID PANEL
CHOL/HDL RATIO: 5
CHOLESTEROL: 292 mg/dL — AB (ref 0–200)
HDL: 57.5 mg/dL (ref >39.00)
LDL Cholesterol: 199 mg/dL — ABNORMAL HIGH (ref 0–99)
NonHDL: 234.5
TRIGLYCERIDES: 178 mg/dL — AB (ref 0.0–149.0)
VLDL: 35.6 mg/dL (ref 0.0–40.0)

## 2014-01-03 LAB — TSH: TSH: 2.01 u[IU]/mL (ref 0.35–4.50)

## 2014-01-03 LAB — VITAMIN B12: Vitamin B-12: 1000 pg/mL — ABNORMAL HIGH (ref 211–911)

## 2014-01-03 LAB — VITAMIN D 25 HYDROXY (VIT D DEFICIENCY, FRACTURES): VITD: 44.24 ng/mL

## 2014-01-10 ENCOUNTER — Encounter: Payer: Medicare Other | Admitting: Family Medicine

## 2014-01-11 ENCOUNTER — Encounter: Payer: Self-pay | Admitting: Family Medicine

## 2014-01-11 ENCOUNTER — Ambulatory Visit (INDEPENDENT_AMBULATORY_CARE_PROVIDER_SITE_OTHER): Payer: Medicare Other | Admitting: Family Medicine

## 2014-01-11 VITALS — BP 130/92 | HR 54 | Temp 98.2°F | Ht 60.0 in | Wt 149.2 lb

## 2014-01-11 DIAGNOSIS — F0390 Unspecified dementia without behavioral disturbance: Secondary | ICD-10-CM | POA: Diagnosis not present

## 2014-01-11 DIAGNOSIS — N289 Disorder of kidney and ureter, unspecified: Secondary | ICD-10-CM

## 2014-01-11 DIAGNOSIS — I10 Essential (primary) hypertension: Secondary | ICD-10-CM | POA: Diagnosis not present

## 2014-01-11 DIAGNOSIS — E7800 Pure hypercholesterolemia, unspecified: Secondary | ICD-10-CM

## 2014-01-11 DIAGNOSIS — M810 Age-related osteoporosis without current pathological fracture: Secondary | ICD-10-CM | POA: Diagnosis not present

## 2014-01-11 DIAGNOSIS — E559 Vitamin D deficiency, unspecified: Secondary | ICD-10-CM | POA: Diagnosis not present

## 2014-01-11 DIAGNOSIS — Z0000 Encounter for general adult medical examination without abnormal findings: Secondary | ICD-10-CM

## 2014-01-11 DIAGNOSIS — Z23 Encounter for immunization: Secondary | ICD-10-CM | POA: Diagnosis not present

## 2014-01-11 LAB — POCT URINALYSIS DIPSTICK
BILIRUBIN UA: NEGATIVE
GLUCOSE UA: NEGATIVE
KETONES UA: NEGATIVE
Nitrite, UA: NEGATIVE
PH UA: 7
Spec Grav, UA: 1.02
Urobilinogen, UA: 1

## 2014-01-11 MED ORDER — POTASSIUM CHLORIDE ER 10 MEQ PO TBCR: EXTENDED_RELEASE_TABLET | ORAL | Status: DC

## 2014-01-11 MED ORDER — LISINOPRIL 10 MG PO TABS: 10.0000 mg | ORAL_TABLET | Freq: Every day | ORAL | Status: DC

## 2014-01-11 MED ORDER — METOPROLOL TARTRATE 50 MG PO TABS: ORAL_TABLET | ORAL | Status: DC

## 2014-01-11 NOTE — Progress Notes (Signed)
Subjective:    Patient ID: Wendy Krueger, female    DOB: 08-28-1929, 80 y.o.   MRN: [id number]  HPI I have personally reviewed the Medicare Annual Wellness questionnaire and have noted 1. The patient's medical and social history 2. Their use of alcohol, tobacco or illicit drugs 3. Their current medications and supplements 4. The patient's functional ability including ADL's, fall risks, home safety risks and hearing or visual             impairment. 5. Diet and physical activities 6. Evidence for depression or mood disorders  The patients weight, height, BMI have been recorded in the chart and visual acuity is per eye clinic.  I have made referrals, counseling and provided education to the patient based review of the above and I have provided the pt with a written personalized care plan for preventive services.  One fall since last visit - fell in the gravel going out to get the mail No injuries - just skinned her knee  Family has a walker at the house - but she prefers the cane (she prefers not to use walker) Family now gets her mail   See scanned forms.  Routine anticipatory guidance given to patient.  See health maintenance. Colon cancer screening- declines at her age  Breast cancer screening 4/15 nl  Self breast exam -no lumps  Flu vaccine-forgot this past fall  Tetanus vaccine 1/06   (grand daughter is pregnant)- needs Tdap -will get at the health dept  Pneumovax 12/12 - and will get prevnar today  Zoster vaccine-insurance does not cover-she can not afford   Advance directive-is in the process of getting living will and POA - written up and not notarized yet -- given packet to review Cognitive function addressed- see scanned forms- and if abnormal then additional documentation follows. -- dementia known on aricept , since last visit pretty much stable per family  No accidents or confusion or wandering (family helps with finances and reminders)  PMH and SH  reviewed  Meds, vitals, and allergies reviewed.   ROS: See HPI.  Otherwise negative.    GFR is down this visit Cr 1.3 Per family- she does not drink enough water  No urinary symptoms   Hyperlipidemia Declines med Lab Results  Component Value Date   CHOL 292* 01/03/2014   CHOL 288* 01/04/2013   CHOL 273* 07/10/2012   Lab Results  Component Value Date   HDL 57.50 01/03/2014   HDL 55.20 01/04/2013   HDL 51.90 07/10/2012   Lab Results  Component Value Date   LDLCALC 199* 01/03/2014   LDLCALC 66 01/06/2011   LDLCALC 78 01/05/2011   Lab Results  Component Value Date   TRIG 178.0* 01/03/2014   TRIG 173.0* 01/04/2013   TRIG 150.0* 07/10/2012   Lab Results  Component Value Date   CHOLHDL 5 01/03/2014   CHOLHDL 5 01/04/2013   CHOLHDL 5 07/10/2012   Lab Results  Component Value Date   LDLDIRECT 197.4 01/04/2013   LDLDIRECT 173.9 07/10/2012   LDLDIRECT 195.2 10/14/2011   overall no change - she refuses cholesterol med  Diet is fair - is a "grazer" -not big portions Eats too much fried food - but just started grilling    Dementia On aricept 10 mg  Overall stable    B12 level is 1000 D level is 44  dexa 1/10 last  OP Had no fx when she fell - and no hx of broken bones  Intol of some  meds-declines tx  Takes her ca and D She declines further dexa   Patient Active Problem List   Diagnosis Date Noted  . Renal insufficiency 01/11/2014  . Dementia 09/01/2013  . Memory loss 09/01/2013  . Anxiety 09/01/2013  . Encounter for Medicare annual wellness exam 01/08/2013  . Colitis 11/11/2011  . Gallstones 11/11/2011  . 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 Left hand weakness 06/04/2011  . Risk for falls 06/04/2011  . Constipation 03/05/2011  . Leg pain, bilateral 01/16/2011  . UNSPECIFIED VITAMIN D DEFICIENCY 03/28/2009  . HYPERCHOLESTEROLEMIA, PURE 03/17/2007  . HYPERTENSION 03/16/2007  . OSTEOARTHRITIS 03/16/2007  . OSTEOPOROSIS 03/16/2007   Past Medical History  Diagnosis  Date  . Hypertension   . Osteoarthritis   . Osteoporosis   . Hyperlipidemia   . Colitis - presumed infectious origin     one ER visit   . Asymptomatic gallstones    Past Surgical History  Procedure Laterality Date  . Abdominal hysterectomy      BSO- fibroids  . Wrist fracture surgery  2/01    R arm  . Abi's      normal  . Left foot brace    . Dexa  1/05    osteoporosis   History  Substance Use Topics  . Smoking status: Never Smoker   . Smokeless tobacco: Never Used  . Alcohol Use: No   Family History  Problem Relation Age of Onset  . Heart attack Father   . Hypertension Father   . Heart attack Mother   . Heart attack Father   . Throat cancer Brother    Allergies  Allergen Reactions  . Alendronate Sodium     Let pain   . Amlodipine Besylate Hives  . Calcitonin ([place]) Other (See Comments)     headache/ head pressure  . Simvastatin Other (See Comments)    Let pain   . Vitamin D Nausea And Vomiting   Current Outpatient Prescriptions on File Prior to Visit  Medication Sig Dispense Refill  . aspirin 81 MG chewable tablet Chew 81 mg by mouth daily.      . cholecalciferol (VITAMIN D) 1000 UNITS tablet Take 1,000 Units by mouth daily.      . CVS VITAMIN B12 1000 MCG tablet TAKE 1 TABLET (1,000 MCG TOTAL) BY MOUTH DAILY.  30 tablet  6  . docusate sodium (COLACE) 100 MG capsule Take 100 mg by mouth 2 (two) times daily.       Marland Kitchen donepezil (ARICEPT) 10 MG tablet Take 1 tablet (10 mg total) by mouth at bedtime.  30 tablet  11  . meclizine (ANTIVERT) 25 MG tablet TAKE 1 TABLET BY MOUTH 3 TIMES A DAY AS NEEDED FOR DIZZINESS  30 tablet  0  . Multiple Vitamins-Minerals (CVS SPECTRAVITE SENIOR) TABS TAKE 1 TABLET BY MOUTH EVERY DAY  125 tablet  2   No current facility-administered medications on file prior to visit.    Review of Systems Review of Systems  Constitutional: Negative for fever, appetite change, fatigue and unexpected weight change.  Eyes: Negative for pain and  visual disturbance.  Respiratory: Negative for cough and shortness of breath.   Cardiovascular: Negative for cp or palpitations    Gastrointestinal: Negative for nausea, diarrhea and constipation.  Genitourinary: Negative for urgency and frequency.  Skin: Negative for pallor or rash   Neurological: Negative for weakness, light-headedness, numbness and headaches.  Hematological: Negative for adenopathy. Does not bruise/bleed easily.  Psychiatric/Behavioral: Negative for dysphoric mood.  The patient is not nervous/anxious.pos for loss of short term memory/ dementia          Objective:   Physical Exam  Constitutional: She appears well-developed and well-nourished. No distress.  HENT:  Head: Normocephalic and atraumatic.  Right Ear: External ear normal.  Left Ear: External ear normal.  Mouth/Throat: Oropharynx is clear and moist.  Eyes: Conjunctivae and EOM are normal. Pupils are equal, round, and reactive to light. No scleral icterus.  Neck: Normal range of motion. Neck supple. No JVD present. Carotid bruit is not present. No thyromegaly present.  Cardiovascular: Normal rate, regular rhythm, normal heart sounds and intact distal pulses.  Exam reveals no gallop.   Pulmonary/Chest: Effort normal and breath sounds normal. No respiratory distress. She has no wheezes. She exhibits no tenderness.  Abdominal: Soft. Bowel sounds are normal. She exhibits no distension, no abdominal bruit and no mass. There is no tenderness.  Genitourinary: No breast swelling, tenderness, discharge or bleeding.  Breast exam: No mass, nodules, thickening, tenderness, bulging, retraction, inflamation, nipple discharge or skin changes noted.  No axillary or clavicular LA.      Musculoskeletal: Normal range of motion. She exhibits no edema and no tenderness.  Mild kyphosis   Lymphadenopathy:    She has no cervical adenopathy.  Neurological: She is alert. She has normal reflexes. No cranial nerve deficit. She exhibits  normal muscle tone. Coordination normal.  Skin: Skin is warm and dry. No rash noted. No erythema. No pallor.  Psychiatric: She has a normal mood and affect. Cognition and memory are impaired. She exhibits abnormal recent memory.  Cheerful Confuses easily and repeats herself frequently          Assessment & Plan:   Problem List Items Addressed This Visit     Cardiovascular and Mediastinum   HYPERTENSION - Primary      bp in fair control at this time  BP Readings from Last 1 Encounters:  01/11/14 130/92   No changes needed Disc lifstyle change with low sodium diet and exercise  Medicines refilled     Relevant Medications      lisinopril (PRINIVIL,ZESTRIL) tablet      metoprolol (LOPRESSOR) tablet     Nervous and Auditory   Dementia     Stable since start of aricept  Will continue to watch Stressed imp of safety with pt and her daughter        Musculoskeletal and Integument   OSTEOPOROSIS     Intol of several meds  D level is 44 No fractures despite one fall  Pt is not interested in future dexas at this time  Disc safety and fall prev       Genitourinary   Renal insufficiency     Cr of 1.3 and dec GFR Per family-not drinking enough water-disc imp of measuring this and prompting her to drink  ua today    Relevant Orders      POCT urinalysis dipstick (Completed)      Urine culture     Other   UNSPECIFIED VITAMIN D DEFICIENCY     Vitamin D level is therapeutic with current supplementation  (44) Disc importance of this to bone and overall health     HYPERCHOLESTEROLEMIA, PURE     Declines medication Disc goals for lipids and reasons to control them Rev labs with pt Rev low sat fat diet in detail Will work on reducing her fatty and fried foods     Relevant Medications  lisinopril (PRINIVIL,ZESTRIL) tablet      metoprolol (LOPRESSOR) tablet   Encounter for Medicare annual wellness exam     Reviewed health habits including diet and exercise and skin  cancer prevention Reviewed appropriate screening tests for age  Also reviewed health mt list, fam hx and immunization status , as well as social and family history   See HPI Dementia documented  Lab rev  Safety discussed - fall precautions  Handout given for adv dir- they are working on one now  She will get Tdap at the health dept prevnar vaccine today     Other Visit Diagnoses   Need for vaccination with 13-polyvalent pneumococcal conjugate vaccine        Relevant Orders       Pneumococcal conjugate vaccine 13-valent (Completed)

## 2014-01-11 NOTE — Assessment & Plan Note (Signed)
Declines medication Disc goals for lipids and reasons to control them Rev labs with pt Rev low sat fat diet in detail Will work on reducing her fatty and fried foods

## 2014-01-11 NOTE — Patient Instructions (Signed)
Please consider using the walker instead of the cane when outside your house  We want to prevent falls - especially with osteoporosis Continue calcium and vitamin D Go to the [employer] for a Tdap  Drink more water - you kidneys are working too hard  Avoid red meat/ fried foods/ egg yolks/ fatty breakfast meats/ butter, cheese and high fat dairy/ and shellfish  For cholesterol   Try to leave a urine specimen on the way out

## 2014-01-11 NOTE — Assessment & Plan Note (Signed)
Vitamin D level is therapeutic with current supplementation  (44) Disc importance of this to bone and overall health

## 2014-01-11 NOTE — Assessment & Plan Note (Addendum)
Reviewed health habits including diet and exercise and skin cancer prevention Reviewed appropriate screening tests for age  Also reviewed health mt list, fam hx and immunization status , as well as social and family history   See HPI Dementia documented  Lab rev  Safety discussed - fall precautions  Handout given for adv dir- they are working on one now  She will get Tdap at the health dept prevnar vaccine today

## 2014-01-11 NOTE — Assessment & Plan Note (Signed)
Intol of several meds  D level is 44 No fractures despite one fall  Pt is not interested in future dexas at this time  Disc safety and fall prev

## 2014-01-11 NOTE — Assessment & Plan Note (Signed)
bp in fair control at this time  BP Readings from Last 1 Encounters:  01/11/14 130/92   No changes needed Disc lifstyle change with low sodium diet and exercise  Medicines refilled

## 2014-01-11 NOTE — Assessment & Plan Note (Signed)
Cr of 1.3 and dec GFR Per family-not drinking enough water-disc imp of measuring this and prompting her to drink  ua today

## 2014-01-11 NOTE — Assessment & Plan Note (Signed)
Stable since start of aricept  Will continue to watch Stressed imp of safety with pt and her daughter

## 2014-01-11 NOTE — Progress Notes (Signed)
Pre visit review using our clinic review tool, if applicable. No additional management support is needed unless otherwise documented below in the visit note. 

## 2014-01-12 ENCOUNTER — Telehealth: Payer: Self-pay | Admitting: Family Medicine

## 2014-01-12 LAB — URINE CULTURE
Colony Count: NO GROWTH
Organism ID, Bacteria: NO GROWTH

## 2014-01-12 MED ORDER — CEPHALEXIN 250 MG PO CAPS: 250.0000 mg | ORAL_CAPSULE | Freq: Two times a day (BID) | ORAL | Status: DC

## 2014-01-12 NOTE — Telephone Encounter (Signed)
Message copied by Abner Greenspan on Wed Jan 12, 2014  8:43 AM ------      Message from: Tammi Sou      Created: Wed Jan 12, 2014  8:27 AM       Pt's daughter notified of UA results and she wanted me to let you know that the pt was running a fever last night, she also felt dizzy and weak, the fever has resolved this morning but given her UA results pt's daughter wanted to see if you think she needs to start an abx. Please advise            Daughter's [name] phone # [phone] ------

## 2014-01-12 NOTE — Telephone Encounter (Signed)
Please call in keflex  Pending urine cx Keep me updated

## 2014-01-12 NOTE — Telephone Encounter (Signed)
Rx sent and pt's daughter notified. Advise daughter we will call with urine cx results and to keep [place] updated of pt's sxs, [name] verbalized understanding

## 2014-01-17 ENCOUNTER — Other Ambulatory Visit: Payer: Self-pay | Admitting: Family Medicine

## 2014-01-19 ENCOUNTER — Other Ambulatory Visit: Payer: Self-pay | Admitting: Family Medicine

## 2014-01-19 DIAGNOSIS — N289 Disorder of kidney and ureter, unspecified: Secondary | ICD-10-CM

## 2014-01-24 ENCOUNTER — Other Ambulatory Visit: Payer: Self-pay | Admitting: Family Medicine

## 2014-01-28 ENCOUNTER — Other Ambulatory Visit (INDEPENDENT_AMBULATORY_CARE_PROVIDER_SITE_OTHER): Payer: Medicare Other

## 2014-01-28 DIAGNOSIS — N289 Disorder of kidney and ureter, unspecified: Secondary | ICD-10-CM

## 2014-01-28 LAB — BASIC METABOLIC PANEL
BUN: 13 mg/dL (ref 6–23)
CO2: 30 mEq/L (ref 19–32)
Calcium: 10 mg/dL (ref 8.4–10.5)
Chloride: 107 mEq/L (ref 96–112)
Creatinine, Ser: 1.1 mg/dL (ref 0.4–1.2)
GFR: 50.51 mL/min — AB (ref >60.00)
Glucose, Bld: 89 mg/dL (ref 70–99)
POTASSIUM: 4.2 meq/L (ref 3.5–5.1)
SODIUM: 142 meq/L (ref 135–145)

## 2014-01-31 ENCOUNTER — Encounter: Payer: Self-pay | Admitting: *Deleted

## 2014-03-19 ENCOUNTER — Inpatient Hospital Stay (HOSPITAL_COMMUNITY)
Admission: EM | Admit: 2014-03-19 | Discharge: 2014-03-23 | DRG: 371 | Disposition: A | Discharge status: Home / Self Care | Payer: Medicare Other | Attending: Internal Medicine | Admitting: Internal Medicine

## 2014-03-19 ENCOUNTER — Encounter (HOSPITAL_COMMUNITY): Payer: Self-pay | Admitting: Emergency Medicine

## 2014-03-19 DIAGNOSIS — R404 Transient alteration of awareness: Secondary | ICD-10-CM | POA: Diagnosis not present

## 2014-03-19 DIAGNOSIS — K2990 Gastroduodenitis, unspecified, without bleeding: Secondary | ICD-10-CM

## 2014-03-19 DIAGNOSIS — K529 Noninfective gastroenteritis and colitis, unspecified: Secondary | ICD-10-CM

## 2014-03-19 DIAGNOSIS — K922 Gastrointestinal hemorrhage, unspecified: Secondary | ICD-10-CM

## 2014-03-19 DIAGNOSIS — Z23 Encounter for immunization: Secondary | ICD-10-CM

## 2014-03-19 DIAGNOSIS — M1990 Unspecified osteoarthritis, unspecified site: Secondary | ICD-10-CM | POA: Diagnosis present

## 2014-03-19 DIAGNOSIS — E875 Hyperkalemia: Secondary | ICD-10-CM

## 2014-03-19 DIAGNOSIS — K2940 Chronic atrophic gastritis without bleeding: Secondary | ICD-10-CM | POA: Diagnosis not present

## 2014-03-19 DIAGNOSIS — A0472 Enterocolitis due to Clostridium difficile, not specified as recurrent: Principal | ICD-10-CM | POA: Diagnosis present

## 2014-03-19 DIAGNOSIS — K2970 Gastritis, unspecified, without bleeding: Secondary | ICD-10-CM | POA: Diagnosis present

## 2014-03-19 DIAGNOSIS — I10 Essential (primary) hypertension: Secondary | ICD-10-CM

## 2014-03-19 DIAGNOSIS — K2980 Duodenitis without bleeding: Secondary | ICD-10-CM | POA: Diagnosis present

## 2014-03-19 DIAGNOSIS — E876 Hypokalemia: Secondary | ICD-10-CM | POA: Diagnosis not present

## 2014-03-19 DIAGNOSIS — E860 Dehydration: Secondary | ICD-10-CM | POA: Diagnosis present

## 2014-03-19 DIAGNOSIS — K921 Melena: Secondary | ICD-10-CM

## 2014-03-19 DIAGNOSIS — I517 Cardiomegaly: Secondary | ICD-10-CM | POA: Diagnosis not present

## 2014-03-19 DIAGNOSIS — Z7982 Long term (current) use of aspirin: Secondary | ICD-10-CM

## 2014-03-19 DIAGNOSIS — F0390 Unspecified dementia without behavioral disturbance: Secondary | ICD-10-CM | POA: Diagnosis present

## 2014-03-19 DIAGNOSIS — Z8719 Personal history of other diseases of the digestive system: Secondary | ICD-10-CM

## 2014-03-19 DIAGNOSIS — M810 Age-related osteoporosis without current pathological fracture: Secondary | ICD-10-CM | POA: Diagnosis present

## 2014-03-19 DIAGNOSIS — E785 Hyperlipidemia, unspecified: Secondary | ICD-10-CM | POA: Diagnosis present

## 2014-03-19 DIAGNOSIS — I359 Nonrheumatic aortic valve disorder, unspecified: Secondary | ICD-10-CM | POA: Diagnosis present

## 2014-03-19 DIAGNOSIS — D649 Anemia, unspecified: Secondary | ICD-10-CM | POA: Diagnosis not present

## 2014-03-19 DIAGNOSIS — Q391 Atresia of esophagus with tracheo-esophageal fistula: Secondary | ICD-10-CM | POA: Diagnosis not present

## 2014-03-19 DIAGNOSIS — R197 Diarrhea, unspecified: Secondary | ICD-10-CM | POA: Diagnosis present

## 2014-03-19 DIAGNOSIS — Z79899 Other long term (current) drug therapy: Secondary | ICD-10-CM | POA: Diagnosis not present

## 2014-03-19 DIAGNOSIS — R55 Syncope and collapse: Secondary | ICD-10-CM | POA: Diagnosis present

## 2014-03-19 DIAGNOSIS — K625 Hemorrhage of anus and rectum: Secondary | ICD-10-CM | POA: Diagnosis not present

## 2014-03-19 DIAGNOSIS — K5289 Other specified noninfective gastroenteritis and colitis: Secondary | ICD-10-CM | POA: Diagnosis not present

## 2014-03-19 DIAGNOSIS — Q393 Congenital stenosis and stricture of esophagus: Secondary | ICD-10-CM

## 2014-03-19 LAB — BASIC METABOLIC PANEL
Anion gap: 14 (ref 5–15)
BUN: 24 mg/dL — AB (ref 6–23)
CO2: 23 mEq/L (ref 19–32)
Calcium: 9.9 mg/dL (ref 8.4–10.5)
Chloride: 104 mEq/L (ref 96–112)
Creatinine, Ser: 1.03 mg/dL (ref 0.50–1.10)
GFR, EST AFRICAN AMERICAN: 57 mL/min — AB (ref >90)
GFR, EST NON AFRICAN AMERICAN: 49 mL/min — AB (ref >90)
Glucose, Bld: 132 mg/dL — ABNORMAL HIGH (ref 70–99)
Potassium: 4 mEq/L (ref 3.7–5.3)
Sodium: 141 mEq/L (ref 137–147)

## 2014-03-19 LAB — POC OCCULT BLOOD, ED: FECAL OCCULT BLD: POSITIVE — AB

## 2014-03-19 LAB — CBC
HCT: 34.6 % — ABNORMAL LOW (ref 36.0–46.0)
Hemoglobin: 11.5 g/dL — ABNORMAL LOW (ref 12.0–15.0)
MCH: 29.9 pg (ref 26.0–34.0)
MCHC: 33.2 g/dL (ref 30.0–36.0)
MCV: 90.1 fL (ref 78.0–100.0)
Platelets: 172 10*3/uL (ref 150–400)
RBC: 3.84 MIL/uL — ABNORMAL LOW (ref 3.87–5.11)
RDW: 13.2 % (ref 11.5–15.5)
WBC: 5.2 10*3/uL (ref 4.0–10.5)

## 2014-03-19 MED ORDER — DONEPEZIL HCL 10 MG PO TABS
10.0000 mg | ORAL_TABLET | Freq: Every day | ORAL | Status: DC
  Administered 2014-03-20 – 2014-03-22 (×4): 10 mg via ORAL
  Filled 2014-03-19 (×5): qty 1

## 2014-03-19 MED ORDER — SODIUM CHLORIDE 0.9 % IV SOLN
INTRAVENOUS | Status: DC
  Administered 2014-03-20 – 2014-03-21 (×3): via INTRAVENOUS

## 2014-03-19 MED ORDER — VITAMIN B-12 1000 MCG PO TABS
1000.0000 ug | ORAL_TABLET | Freq: Every day | ORAL | Status: DC
  Administered 2014-03-20 – 2014-03-22 (×3): 1000 ug via ORAL
  Filled 2014-03-19 (×3): qty 1

## 2014-03-19 MED ORDER — POTASSIUM CHLORIDE ER 10 MEQ PO TBCR
20.0000 meq | EXTENDED_RELEASE_TABLET | Freq: Every day | ORAL | Status: DC
  Administered 2014-03-20 – 2014-03-22 (×4): 20 meq via ORAL
  Filled 2014-03-19 (×5): qty 2

## 2014-03-19 MED ORDER — VITAMIN D3 25 MCG (1000 UNIT) PO TABS
1000.0000 [IU] | ORAL_TABLET | Freq: Every day | ORAL | Status: DC
  Administered 2014-03-20 – 2014-03-23 (×4): 1000 [IU] via ORAL
  Filled 2014-03-19 (×4): qty 1

## 2014-03-19 MED ORDER — LISINOPRIL 10 MG PO TABS
10.0000 mg | ORAL_TABLET | Freq: Every day | ORAL | Status: DC
  Administered 2014-03-20 – 2014-03-23 (×4): 10 mg via ORAL
  Filled 2014-03-19 (×4): qty 1

## 2014-03-19 MED ORDER — LOPERAMIDE HCL 2 MG PO CAPS: 2.0000 mg | ORAL_CAPSULE | ORAL | Status: DC | PRN

## 2014-03-19 MED ORDER — METOPROLOL TARTRATE 50 MG PO TABS
50.0000 mg | ORAL_TABLET | Freq: Two times a day (BID) | ORAL | Status: DC
  Administered 2014-03-20 – 2014-03-23 (×8): 50 mg via ORAL
  Filled 2014-03-19 (×8): qty 1

## 2014-03-19 MED ORDER — SODIUM CHLORIDE 0.9 % IJ SOLN
3.0000 mL | Freq: Two times a day (BID) | INTRAMUSCULAR | Status: DC
  Administered 2014-03-20 – 2014-03-23 (×5): 3 mL via INTRAVENOUS

## 2014-03-19 MED ORDER — DOCUSATE SODIUM 100 MG PO CAPS
100.0000 mg | ORAL_CAPSULE | Freq: Two times a day (BID) | ORAL | Status: DC
  Filled 2014-03-19: qty 1

## 2014-03-19 MED ORDER — ASPIRIN 81 MG PO CHEW
81.0000 mg | CHEWABLE_TABLET | Freq: Every day | ORAL | Status: DC
  Administered 2014-03-20: 81 mg via ORAL
  Filled 2014-03-19: qty 1

## 2014-03-19 MED ORDER — MECLIZINE HCL 25 MG PO TABS
25.0000 mg | ORAL_TABLET | Freq: Three times a day (TID) | ORAL | Status: DC | PRN
  Filled 2014-03-19: qty 1

## 2014-03-19 NOTE — ED Notes (Signed)
Pt to ED via [facility] c/o near syncopal episode at home. Family reports sitting in a chair when pt started c/o of being hot, then pt was nonverbal briefly. Pt reports being able to see her children but unable to respond. Also c/o diarrhea starting today and nausea that comes and goes. Pt denies injury. Initial EMS bp 80/63; bp on arrival 144/79

## 2014-03-19 NOTE — ED Notes (Signed)
Pt reports sitting at home when she felt warm and lightheaded. Pt reports feeling "an out of body experience." reports hearing her family talking to her but unable to hear what they were saying. Pt also reports diarrhea beginning today associated with bright red blood in the toilet. Family reports pt has not been eating or drinking like she should. Patient denies pain at this time

## 2014-03-19 NOTE — ED Provider Notes (Signed)
CSN: [id number]     Arrival date & time 03/19/14  1925 History   First MD Initiated Contact with Patient 03/19/14 1938     Chief Complaint  Patient presents with  . Near Syncope   HPI  Pt with PMH HTN, HLD presents for near syncopal event.  Was with family sitting in chair when she suddenly felt warm, diaphoretic and nauseated.  Per family, she became pale and clammy.  Symptoms resolved ni one minute.  Pt never LOC but did feel an out of body experience.  Pt has had diarrhea for the past day as well without n/v. Diarrhea has been bloody, drips bright red blood into toilet. This bloody stool has been chronic and has previously been labeled external hemorrhoids.  She denies CP, pleurisy, LE Edema, SOB, dysuria.  This occurred a few years ago and was dehydrated in setting of diarrhea; admitted at that time.    Past Medical History  Diagnosis Date  . Hypertension   . Osteoarthritis   . Osteoporosis   . Hyperlipidemia   . Colitis - presumed infectious origin     one ER visit   . Asymptomatic gallstones    Past Surgical History  Procedure Laterality Date  . Abdominal hysterectomy      BSO- fibroids  . Wrist fracture surgery  2/01    R arm  . Abi's      normal  . Left foot brace    . Dexa  1/05    osteoporosis   Family History  Problem Relation Age of Onset  . Heart attack Father   . Hypertension Father   . Heart attack Mother   . Heart attack Father   . Throat cancer Brother    History  Substance Use Topics  . Smoking status: Never Smoker   . Smokeless tobacco: Never Used  . Alcohol Use: No   OB History   Grav Para Term Preterm Abortions TAB SAB Ect Mult Living                 Review of Systems  Constitutional: Negative for fever and chills.  Respiratory: Negative for cough, shortness of breath and wheezing.   Cardiovascular: Negative for chest pain.  Gastrointestinal: Positive for nausea. Negative for vomiting and abdominal pain.  Musculoskeletal: Negative for back  pain and gait problem.  Skin: Negative for rash.  Neurological: Positive for weakness and light-headedness. Negative for dizziness, seizures, facial asymmetry, speech difficulty, numbness and headaches.  All other systems reviewed and are negative.     Allergies  Alendronate sodium; Amlodipine besylate; Calcitonin (salmon); Simvastatin; and Vitamin d  Home Medications   Prior to Admission medications   Medication Sig Start Date End Date Taking? Authorizing Provider  aspirin 81 MG chewable tablet Chew 81 mg by mouth daily.    Historical Provider, MD  cephALEXin (KEFLEX) 250 MG capsule Take 1 capsule (250 mg total) by mouth 2 (two) times daily. 01/12/14   Abner Greenspan, MD  cholecalciferol (VITAMIN D) 1000 UNITS tablet Take 1,000 Units by mouth daily.    Historical Provider, MD  [facility] VITAMIN B12 1000 MCG tablet TAKE 1 TABLET (1,000 MCG TOTAL) BY MOUTH DAILY. 06/26/13   Abner Greenspan, MD  docusate sodium (COLACE) 100 MG capsule Take 100 mg by mouth 2 (two) times daily.     Historical Provider, MD  donepezil (ARICEPT) 10 MG tablet Take 1 tablet (10 mg total) by mouth at bedtime. 10/11/13   Abner Greenspan, MD  lisinopril (PRINIVIL,ZESTRIL) 10 MG tablet Take 1 tablet (10 mg total) by mouth daily. 01/11/14   Abner Greenspan, MD  meclizine (ANTIVERT) 25 MG tablet TAKE 1 TABLET BY MOUTH 3 TIMES A DAY AS NEEDED FOR DIZZINESS 01/02/12   Abner Greenspan, MD  metoprolol (LOPRESSOR) 50 MG tablet TAKE 1 TABLET BY MOUTH TWICE A DAY 01/11/14   Abner Greenspan, MD  Multiple Vitamins-Minerals ([facility] SPECTRAVITE SENIOR) TABS TAKE 1 TABLET BY MOUTH EVERY DAY 04/10/13   Abner Greenspan, MD  potassium chloride (KLOR-CON 10) 10 MEQ tablet TAKE 2 TABLETS (20 MEQ TOTAL) BY MOUTH DAILY. 01/11/14   [place], MD   BP 144/79  Pulse 79  Temp(Src) 98.3 F (36.8 C) (Oral)  Resp 16  SpO2 99% Physical Exam  Nursing note and vitals reviewed. Constitutional: She is oriented to person, place, and time.  Frail, elderly  HENT:   Head: Normocephalic and atraumatic.  Nose: Nose normal.  Eyes: Conjunctivae are normal.  Neck: Normal range of motion. Neck supple. No tracheal deviation present.  Cardiovascular: Normal rate, regular rhythm and normal heart sounds.   No murmur heard. Pulmonary/Chest: Effort normal and breath sounds normal. No respiratory distress. She has no rales.  Abdominal: Soft. Bowel sounds are normal. She exhibits no distension and no mass. There is no tenderness.  Genitourinary:  Multiple large skin tags and external hemorrhoids on external exam. + hemoccult and gross blood.  Internal exam. Without TTP  Musculoskeletal: Normal range of motion. She exhibits no edema.  Neurological: She is alert and oriented to person, place, and time.  Skin: Skin is warm and dry. No rash noted.  Psychiatric: She has a normal mood and affect.    ED Course  Procedures (including critical care time) Labs Review Labs Reviewed  BASIC METABOLIC PANEL - Abnormal; Notable for the following:    Glucose, Bld 132 (*)    BUN 24 (*)    GFR calc non Af Amer 49 (*)    GFR calc Af Amer 57 (*)    All other components within normal limits  CBC - Abnormal; Notable for the following:    RBC 3.84 (*)    Hemoglobin 11.5 (*)    HCT 34.6 (*)    All other components within normal limits  POC OCCULT BLOOD, ED - Abnormal; Notable for the following:    Fecal Occult Bld POSITIVE (*)    All other components within normal limits  OCCULT BLOOD X 1 CARD TO LAB, STOOL  URINALYSIS, ROUTINE W REFLEX MICROSCOPIC  CBC  BASIC METABOLIC PANEL    Imaging Review No results found.   EKG Interpretation   Date/Time:  Saturday March 19 2014 19:32:42 EDT Ventricular Rate:  81 PR Interval:  139 QRS Duration: 133 QT Interval:  423 QTC Calculation: 491 R Axis:   -24 Text Interpretation:  Sinus rhythm Left bundle branch block Baseline  wander in lead(s) III aVL aVF changed from prior EKG Confirmed by BELFI   MD, MELANIE ([id number]) on  03/19/2014 9:23:18 PM      MDM   Final diagnoses:  Near syncope  BRBPR (bright red blood per rectum)  Diarrhea  Hyperkalemia    Presents for near syncope. Not c.w. Seizure.  At rest.    Hypotensive in field.Pt has new LBBB compared to prior. Mildly anemic here.  High risk syncope, requiring admission.  No signs of DVT on exma  Pt says BRBPR is chronic and not new.  Multiple skin tags  and external hemorrhoid noted on exam.    Vitals remained stable throughout ED  Dispo: Admit to hospitalist   Tammy Sours, MD 03/20/14 [phone]

## 2014-03-19 NOTE — H&P (Signed)
[facility] History and Physical  Wendy Krueger GHW:[id number] DOB: Mar 26, 1930 DOA: 03/19/2014  Referring physician: EDP PCP: Loura Pardon, MD   Chief Complaint: Near syncope   HPI: Wendy Krueger is a 80 y.o. female who presents to the ED after an episode of near syncope that occurred at home today.  Patient was sitting in a chair, c/o feeling hot, then became nonverbal briefly.  Patient reports being able to see children but being unable to respond.  BP initially 80/63 on EMS arrival but normalized when she arrived here.  This occurs in the context of new onset diarrhea today as well as intermittent nausea.  There is some blood in her stool but this isnt atypical she states due to her hemorrhoids.  Review of Systems: Systems reviewed.  As above, otherwise negative  Past Medical History  Diagnosis Date  . Hypertension   . Osteoarthritis   . Osteoporosis   . Hyperlipidemia   . Colitis - presumed infectious origin     one ER visit   . Asymptomatic gallstones    Past Surgical History  Procedure Laterality Date  . Abdominal hysterectomy      BSO- fibroids  . Wrist fracture surgery  2/01    R arm  . Abi's      normal  . Left foot brace    . Dexa  1/05    osteoporosis   Social History:  reports that she has never smoked. She has never used smokeless tobacco. She reports that she does not drink alcohol or use illicit drugs.  Allergies  Allergen Reactions  . Alendronate Sodium     Let pain   . Amlodipine Besylate Hives  . Calcitonin (Salmon) Other (See Comments)     headache/ head pressure  . Simvastatin Other (See Comments)    Let pain     Family History  Problem Relation Age of Onset  . Heart attack Father   . Hypertension Father   . Heart attack Mother   . Heart attack Father   . Throat cancer Brother      Prior to Admission medications   Medication Sig Start Date End Date Taking? Authorizing Provider  aspirin 81 MG chewable tablet Chew 81 mg by  mouth daily.   Yes Historical Provider, MD  cholecalciferol (VITAMIN D) 1000 UNITS tablet Take 1,000 Units by mouth daily.   Yes Historical Provider, MD  docusate sodium (COLACE) 100 MG capsule Take 100 mg by mouth 2 (two) times daily.    Yes Historical Provider, MD  donepezil (ARICEPT) 10 MG tablet Take 10 mg by mouth at bedtime.   Yes Historical Provider, MD  lisinopril (PRINIVIL,ZESTRIL) 10 MG tablet Take 10 mg by mouth daily.   Yes Historical Provider, MD  loperamide (IMODIUM) 2 MG capsule Take 2 mg by mouth as needed for diarrhea or loose stools.   Yes Historical Provider, MD  meclizine (ANTIVERT) 25 MG tablet Take 25 mg by mouth 3 (three) times daily as needed for dizziness.   Yes Historical Provider, MD  metoprolol (LOPRESSOR) 50 MG tablet Take 50 mg by mouth 2 (two) times daily.   Yes Historical Provider, MD  Multiple Vitamins-Minerals (MULTIVITAMIN PO) Take 1 tablet by mouth daily.   Yes Historical Provider, MD  potassium chloride (K-DUR) 10 MEQ tablet Take 20 mEq by mouth at bedtime.   Yes Historical Provider, MD  vitamin B-12 (CYANOCOBALAMIN) 1000 MCG tablet Take 1,000 mcg by mouth daily.   Yes Historical Provider, MD  Physical Exam: Filed Vitals:   03/19/14 2330  BP: 145/75  Pulse: 72  Temp:   Resp: 15    BP 145/75  Pulse 72  Temp(Src) 98.3 F (36.8 C) (Oral)  Resp 15  SpO2 98%  General Appearance:    Alert, oriented, no distress, appears stated age  Head:    Normocephalic, atraumatic  Eyes:    PERRL, EOMI, sclera non-icteric        Nose:   Nares without drainage or epistaxis. Mucosa, turbinates normal  Throat:   Moist mucous membranes. Oropharynx without erythema or exudate.  Neck:   Supple. No carotid bruits.  No thyromegaly.  No lymphadenopathy.   Back:     No CVA tenderness, no spinal tenderness  Lungs:     Clear to auscultation bilaterally, without wheezes, rhonchi or rales  Chest wall:    No tenderness to palpitation  Heart:    Regular rate and rhythm without  murmurs, gallops, rubs  Abdomen:     Soft, non-tender, nondistended, normal bowel sounds, no organomegaly  Genitalia:    deferred  Rectal:    deferred  Extremities:   No clubbing, cyanosis or edema.  Pulses:   2+ and symmetric all extremities  Skin:   Skin color, texture, turgor normal, no rashes or lesions  Lymph nodes:   Cervical, supraclavicular, and axillary nodes normal  Neurologic:   CNII-XII intact. Normal strength, sensation and reflexes      throughout    Labs on Admission:  Basic Metabolic Panel:  Recent Labs Lab 03/19/14 1945  NA 141  K 4.0  CL 104  CO2 23  GLUCOSE 132*  BUN 24*  CREATININE 1.03  CALCIUM 9.9   Liver Function Tests: No results found for this basename: AST, ALT, ALKPHOS, BILITOT, PROT, ALBUMIN,  in the last 168 hours No results found for this basename: LIPASE, AMYLASE,  in the last 168 hours No results found for this basename: AMMONIA,  in the last 168 hours CBC:  Recent Labs Lab 03/19/14 1945  WBC 5.2  HGB 11.5*  HCT 34.6*  MCV 90.1  PLT 172   Cardiac Enzymes: No results found for this basename: CKTOTAL, CKMB, CKMBINDEX, TROPONINI,  in the last 168 hours  BNP (last 3 results) No results found for this basename: PROBNP,  in the last 8760 hours CBG: No results found for this basename: GLUCAP,  in the last 168 hours  Radiological Exams on Admission: No results found.  EKG: Independently reviewed.  Assessment/Plan Principal Problem:   Near syncope Active Problems:   BRBPR (bright red blood per rectum)   Diarrhea   1. Near syncope - Most likely due to one of 2 situations (although others such as cardiogenic syncope are not entirely impossible): 1. Infectious diarrhea and nausea leading to dehydration or vasovagal response which caused hypotension and ultimately near syncope or 2. LGIB leading to hypotension possibly via vasovagal response and near syncope 3. IVF ordered at 125 cc/hr 4. Tele monitor 2. BRBPR 1. Recheck labs in  AM including CBC to monitor HGB 2. Has significant hemorrhoid disease, but diverticular bleed is also a possibility 3. May wish to involve GI in AM, would certainly involve GI if HGB drops.   Code Status: Full Code  Family Communication: Family at bedside Disposition Plan: Admit to obs   Time spent: 70 min  Jezabelle Chisolm M. [facility] Pager [phone]  If 7AM-7PM, please contact the day team taking care of the patient [email or web address] Password [facility] 03/19/2014,  11:45 PM

## 2014-03-20 DIAGNOSIS — R55 Syncope and collapse: Secondary | ICD-10-CM

## 2014-03-20 DIAGNOSIS — K625 Hemorrhage of anus and rectum: Secondary | ICD-10-CM

## 2014-03-20 LAB — CBC
HCT: 30.4 % — ABNORMAL LOW (ref 36.0–46.0)
Hemoglobin: 10.2 g/dL — ABNORMAL LOW (ref 12.0–15.0)
MCH: 30.1 pg (ref 26.0–34.0)
MCHC: 33.6 g/dL (ref 30.0–36.0)
MCV: 89.7 fL (ref 78.0–100.0)
PLATELETS: 174 10*3/uL (ref 150–400)
RBC: 3.39 MIL/uL — AB (ref 3.87–5.11)
RDW: 13.3 % (ref 11.5–15.5)
WBC: 4.9 10*3/uL (ref 4.0–10.5)

## 2014-03-20 LAB — BASIC METABOLIC PANEL
Anion gap: 12 (ref 5–15)
BUN: 24 mg/dL — ABNORMAL HIGH (ref 6–23)
CO2: 22 mEq/L (ref 19–32)
CREATININE: 0.94 mg/dL (ref 0.50–1.10)
Calcium: 9 mg/dL (ref 8.4–10.5)
Chloride: 107 mEq/L (ref 96–112)
GFR calc non Af Amer: 55 mL/min — ABNORMAL LOW (ref >90)
GFR, EST AFRICAN AMERICAN: 64 mL/min — AB (ref >90)
Glucose, Bld: 97 mg/dL (ref 70–99)
POTASSIUM: 3.9 meq/L (ref 3.7–5.3)
SODIUM: 141 meq/L (ref 137–147)

## 2014-03-20 LAB — TROPONIN I
Troponin I: <0.3 ng/mL (ref <0.30)
Troponin I: <0.3 ng/mL (ref <0.30)

## 2014-03-20 LAB — URINALYSIS, ROUTINE W REFLEX MICROSCOPIC
Bilirubin Urine: NEGATIVE
Glucose, UA: NEGATIVE mg/dL
KETONES UR: 15 mg/dL — AB
NITRITE: NEGATIVE
Protein, ur: NEGATIVE mg/dL
Specific Gravity, Urine: 1.025 (ref 1.005–1.030)
Urobilinogen, UA: 0.2 mg/dL (ref 0.0–1.0)
pH: 5 (ref 5.0–8.0)

## 2014-03-20 MED ORDER — INFLUENZA VAC SPLIT QUAD 0.5 ML IM SUSY
0.5000 mL | PREFILLED_SYRINGE | INTRAMUSCULAR | Status: DC
  Filled 2014-03-20: qty 0.5

## 2014-03-20 NOTE — ED Provider Notes (Signed)
I saw and evaluated the patient, reviewed the resident's note and I agree with the findings and plan.   EKG Interpretation   Date/Time:  Saturday March 19 2014 19:32:42 EDT Ventricular Rate:  81 PR Interval:  139 QRS Duration: 133 QT Interval:  423 QTC Calculation: 491 R Axis:   -24 Text Interpretation:  Sinus rhythm Left bundle branch block Baseline  wander in lead(s) III aVL aVF changed from prior EKG Confirmed by Concepcion Gillott   MD, Lala Been ([id number]) on 03/19/2014 9:23:18 PM      PT with syncopal episode while sitting in chair.  Has had some diarrhea today.  Hgb drop since last values.  No CP or SOB.  No neuro deficits.  Plan admission  Malvin Johns, MD 03/20/14 1210

## 2014-03-20 NOTE — Progress Notes (Signed)
   [facility] PROGRESS NOTE Assessment/Plan: Near syncope: - Check orthostatic. ekg showed LBBB no previous to compare, check echo and cardiac markers - No episodes of hypotension in the hospital. - no episode of BRBPR. Check CBC and b-met in am. No events on telemetry.  Diarrhea - no further diarrhea.    Code Status: Full Code  Family Communication: Family at bedside  Disposition Plan: Admit to obs     Consultants:  none  Procedures:  echo  Antibiotics:  None  HPI/Subjective: No complains wants to go home  Objective: Filed Vitals:   03/19/14 2315 03/19/14 2330 03/20/14 0114 03/20/14 0509  BP: 146/77 145/75 151/80 147/57  Pulse: 78 72 64 60  Temp:   98.6 F (37 C) 98.2 F (36.8 C)  TempSrc:   Oral   Resp: $Remo'18 15 14 16  'BfAyW$ Height:   '5\' 1"'$  (1.549 m)   Weight:   68.947 kg (152 lb)   SpO2: 96% 98% 98% 98%    Intake/Output Summary (Last 24 hours) at 03/20/14 1114 Last data filed at 03/20/14 0653  Gross per 24 hour  Intake  687.5 ml  Output      0 ml  Net  687.5 ml   Filed Weights   03/20/14 0114  Weight: 68.947 kg (152 lb)    Exam:  General: Alert, awake, oriented x3, in no acute distress.  HEENT: No bruits, no goiter.  Heart: Regular rate and rhythm, systolic murmur in the aortic area. Lungs: Good air movement, clear Abdomen: Soft, nontender, nondistended, positive bowel sounds.   Data Reviewed: Basic Metabolic Panel:  Recent Labs Lab 03/19/14 1945 03/20/14 0300  NA 141 141  K 4.0 3.9  CL 104 107  CO2 23 22  GLUCOSE 132* 97  BUN 24* 24*  CREATININE 1.03 0.94  CALCIUM 9.9 9.0   Liver Function Tests: No results found for this basename: AST, ALT, ALKPHOS, BILITOT, PROT, ALBUMIN,  in the last 168 hours No results found for this basename: LIPASE, AMYLASE,  in the last 168 hours No results found for this basename: AMMONIA,  in the last 168 hours CBC:  Recent Labs Lab 03/19/14 1945 03/20/14 0300  WBC 5.2 4.9  HGB 11.5* 10.2*    HCT 34.6* 30.4*  MCV 90.1 89.7  PLT 172 174   Cardiac Enzymes: No results found for this basename: CKTOTAL, CKMB, CKMBINDEX, TROPONINI,  in the last 168 hours BNP (last 3 results) No results found for this basename: PROBNP,  in the last 8760 hours CBG: No results found for this basename: GLUCAP,  in the last 168 hours  No results found for this or any previous visit (from the past 240 hour(s)).   Studies: No results found.  Scheduled Meds: . aspirin  81 mg Oral Daily  . cholecalciferol  1,000 Units Oral Daily  . donepezil  10 mg Oral QHS  . [START ON 03/21/2014] Influenza vac split quadrivalent PF  0.5 mL Intramuscular Tomorrow-1000  . lisinopril  10 mg Oral Daily  . metoprolol  50 mg Oral BID  . potassium chloride  20 mEq Oral QHS  . sodium chloride  3 mL Intravenous Q12H  . vitamin B-12  1,000 mcg Oral Daily   Continuous Infusions: . sodium chloride 125 mL/hr at 03/20/14 0930     Wendy Krueger  [facility] Pager [phone]. If 8PM-8AM, please contact night-coverage at [email or web address], password [email or web address] 03/20/2014, 11:14 AM  LOS: 1 day

## 2014-03-20 NOTE — Progress Notes (Signed)
Utilization Review Completed.   Aalyssa Elderkin, RN, BSN Nurse Case Manager  

## 2014-03-21 ENCOUNTER — Observation Stay (HOSPITAL_COMMUNITY): Payer: Medicare Other | Admitting: Certified Registered Nurse Anesthetist

## 2014-03-21 ENCOUNTER — Encounter (HOSPITAL_COMMUNITY)
Admission: EM | Disposition: A | Discharge status: Home / Self Care | Payer: Self-pay | Source: Home / Self Care | Attending: Internal Medicine

## 2014-03-21 ENCOUNTER — Encounter (HOSPITAL_COMMUNITY): Payer: Self-pay | Admitting: *Deleted

## 2014-03-21 ENCOUNTER — Encounter (HOSPITAL_COMMUNITY): Payer: Medicare Other | Admitting: Certified Registered Nurse Anesthetist

## 2014-03-21 DIAGNOSIS — I359 Nonrheumatic aortic valve disorder, unspecified: Secondary | ICD-10-CM | POA: Diagnosis present

## 2014-03-21 DIAGNOSIS — K2990 Gastroduodenitis, unspecified, without bleeding: Secondary | ICD-10-CM

## 2014-03-21 DIAGNOSIS — M1990 Unspecified osteoarthritis, unspecified site: Secondary | ICD-10-CM | POA: Diagnosis present

## 2014-03-21 DIAGNOSIS — K2970 Gastritis, unspecified, without bleeding: Secondary | ICD-10-CM | POA: Diagnosis not present

## 2014-03-21 DIAGNOSIS — K921 Melena: Secondary | ICD-10-CM | POA: Diagnosis not present

## 2014-03-21 DIAGNOSIS — M810 Age-related osteoporosis without current pathological fracture: Secondary | ICD-10-CM | POA: Diagnosis present

## 2014-03-21 DIAGNOSIS — D649 Anemia, unspecified: Secondary | ICD-10-CM | POA: Diagnosis not present

## 2014-03-21 DIAGNOSIS — R55 Syncope and collapse: Secondary | ICD-10-CM | POA: Diagnosis not present

## 2014-03-21 DIAGNOSIS — Q391 Atresia of esophagus with tracheo-esophageal fistula: Secondary | ICD-10-CM | POA: Diagnosis not present

## 2014-03-21 DIAGNOSIS — K2940 Chronic atrophic gastritis without bleeding: Secondary | ICD-10-CM | POA: Diagnosis not present

## 2014-03-21 DIAGNOSIS — K625 Hemorrhage of anus and rectum: Secondary | ICD-10-CM | POA: Diagnosis not present

## 2014-03-21 DIAGNOSIS — Z8719 Personal history of other diseases of the digestive system: Secondary | ICD-10-CM

## 2014-03-21 DIAGNOSIS — E876 Hypokalemia: Secondary | ICD-10-CM | POA: Diagnosis not present

## 2014-03-21 DIAGNOSIS — I10 Essential (primary) hypertension: Secondary | ICD-10-CM | POA: Diagnosis not present

## 2014-03-21 DIAGNOSIS — A0472 Enterocolitis due to Clostridium difficile, not specified as recurrent: Secondary | ICD-10-CM | POA: Diagnosis not present

## 2014-03-21 DIAGNOSIS — E860 Dehydration: Secondary | ICD-10-CM | POA: Diagnosis not present

## 2014-03-21 DIAGNOSIS — E785 Hyperlipidemia, unspecified: Secondary | ICD-10-CM | POA: Diagnosis present

## 2014-03-21 DIAGNOSIS — Q393 Congenital stenosis and stricture of esophagus: Secondary | ICD-10-CM | POA: Diagnosis not present

## 2014-03-21 DIAGNOSIS — K5289 Other specified noninfective gastroenteritis and colitis: Secondary | ICD-10-CM | POA: Diagnosis not present

## 2014-03-21 DIAGNOSIS — Z79899 Other long term (current) drug therapy: Secondary | ICD-10-CM | POA: Diagnosis not present

## 2014-03-21 DIAGNOSIS — Z7982 Long term (current) use of aspirin: Secondary | ICD-10-CM | POA: Diagnosis not present

## 2014-03-21 DIAGNOSIS — Z23 Encounter for immunization: Secondary | ICD-10-CM | POA: Diagnosis not present

## 2014-03-21 DIAGNOSIS — R197 Diarrhea, unspecified: Secondary | ICD-10-CM | POA: Diagnosis not present

## 2014-03-21 DIAGNOSIS — F0390 Unspecified dementia without behavioral disturbance: Secondary | ICD-10-CM | POA: Diagnosis present

## 2014-03-21 DIAGNOSIS — K2980 Duodenitis without bleeding: Secondary | ICD-10-CM | POA: Diagnosis not present

## 2014-03-21 DIAGNOSIS — I517 Cardiomegaly: Secondary | ICD-10-CM | POA: Diagnosis not present

## 2014-03-21 HISTORY — PX: ESOPHAGOGASTRODUODENOSCOPY: SHX5428

## 2014-03-21 LAB — BASIC METABOLIC PANEL
ANION GAP: 9 (ref 5–15)
BUN: 23 mg/dL (ref 6–23)
CHLORIDE: 114 meq/L — AB (ref 96–112)
CO2: 23 mEq/L (ref 19–32)
Calcium: 8.6 mg/dL (ref 8.4–10.5)
Creatinine, Ser: 0.95 mg/dL (ref 0.50–1.10)
GFR calc Af Amer: 63 mL/min — ABNORMAL LOW (ref >90)
GFR calc non Af Amer: 54 mL/min — ABNORMAL LOW (ref >90)
GLUCOSE: 89 mg/dL (ref 70–99)
Potassium: 4.2 mEq/L (ref 3.7–5.3)
Sodium: 146 mEq/L (ref 137–147)

## 2014-03-21 LAB — RETICULOCYTES
RBC.: 2.8 MIL/uL — ABNORMAL LOW (ref 3.87–5.11)
Retic Count, Absolute: 42 10*3/uL (ref 19.0–186.0)
Retic Ct Pct: 1.5 % (ref 0.4–3.1)

## 2014-03-21 LAB — ABO/RH: ABO/RH(D): O POS

## 2014-03-21 LAB — IRON AND TIBC
IRON: 42 ug/dL (ref 42–135)
Saturation Ratios: 22 % (ref 20–55)
TIBC: 193 ug/dL — AB (ref 250–470)
UIBC: 151 ug/dL (ref 125–400)

## 2014-03-21 LAB — CBC
HCT: 27.3 % — ABNORMAL LOW (ref 36.0–46.0)
HEMOGLOBIN: 9.1 g/dL — AB (ref 12.0–15.0)
MCH: 30.4 pg (ref 26.0–34.0)
MCHC: 33.3 g/dL (ref 30.0–36.0)
MCV: 91.3 fL (ref 78.0–100.0)
Platelets: 152 10*3/uL (ref 150–400)
RBC: 2.99 MIL/uL — AB (ref 3.87–5.11)
RDW: 13.4 % (ref 11.5–15.5)
WBC: 3.8 10*3/uL — ABNORMAL LOW (ref 4.0–10.5)

## 2014-03-21 LAB — TROPONIN I

## 2014-03-21 LAB — CLOSTRIDIUM DIFFICILE BY PCR: Toxigenic C. Difficile by PCR: POSITIVE — AB

## 2014-03-21 SURGERY — EGD (ESOPHAGOGASTRODUODENOSCOPY)
Anesthesia: Monitor Anesthesia Care

## 2014-03-21 MED ORDER — SODIUM CHLORIDE 0.9 % IV SOLN
INTRAVENOUS | Status: DC | PRN
  Administered 2014-03-21: 11:00:00 via INTRAVENOUS

## 2014-03-21 MED ORDER — PROPOFOL INFUSION 10 MG/ML OPTIME
INTRAVENOUS | Status: DC | PRN
  Administered 2014-03-21: 200 ug/kg/min via INTRAVENOUS

## 2014-03-21 MED ORDER — SODIUM CHLORIDE 0.9 % IV SOLN
INTRAVENOUS | Status: DC
  Administered 2014-03-21: 500 mL via INTRAVENOUS

## 2014-03-21 MED ORDER — METRONIDAZOLE 500 MG PO TABS
500.0000 mg | ORAL_TABLET | Freq: Three times a day (TID) | ORAL | Status: DC
  Administered 2014-03-21 – 2014-03-23 (×5): 500 mg via ORAL
  Filled 2014-03-21 (×7): qty 1

## 2014-03-21 MED ORDER — BUTAMBEN-TETRACAINE-BENZOCAINE 2-2-14 % EX AERO
INHALATION_SPRAY | CUTANEOUS | Status: DC | PRN
  Administered 2014-03-21: 2 via TOPICAL

## 2014-03-21 MED ORDER — PANTOPRAZOLE SODIUM 40 MG PO TBEC
40.0000 mg | DELAYED_RELEASE_TABLET | Freq: Two times a day (BID) | ORAL | Status: DC
  Administered 2014-03-21 – 2014-03-23 (×4): 40 mg via ORAL
  Filled 2014-03-21 (×4): qty 1

## 2014-03-21 NOTE — Consult Note (Signed)
Referring Provider: [facility] Primary Care Physician:  Loura Pardon, MD Primary Gastroenterologist:  unassigned  Reason for Consultation:   Diarrhea, melena, hematochezia     HPI: Wendy Krueger is a 80 y.o. female who was admitted through ER Sat after having a near syncopal episode. Pt says she had been feeling well til that day. Sat she developed diarrhea that she says was pitch black She also felt nauseous Saturday.. She had been having BRBPR for several weeks, with blood on toilet tissue, blood on stool and blood dripping in to commode. She has otherwise had no change in her bowel habits, no anorexia, no wt loss.Pt says she has never had a colonoscopy or EGD. Pt says she had colitis in 2013 but is unclear to etiology, daughter thinks it was infectious.   Past Medical History  Diagnosis Date  . Hypertension   . Osteoarthritis   . Osteoporosis   . Hyperlipidemia   . Colitis - presumed infectious origin     one ER visit   . Asymptomatic gallstones     Past Surgical History  Procedure Laterality Date  . Abdominal hysterectomy      BSO- fibroids  . Wrist fracture surgery  2/01    R arm  . Abi's      normal  . Left foot brace    . Dexa  1/05    osteoporosis    Prior to Admission medications   Medication Sig Start Date End Date Taking? Authorizing Provider  aspirin 81 MG chewable tablet Chew 81 mg by mouth daily.   Yes Historical Provider, MD  cholecalciferol (VITAMIN D) 1000 UNITS tablet Take 1,000 Units by mouth daily.   Yes Historical Provider, MD  docusate sodium (COLACE) 100 MG capsule Take 100 mg by mouth 2 (two) times daily.    Yes Historical Provider, MD  donepezil (ARICEPT) 10 MG tablet Take 10 mg by mouth at bedtime.   Yes Historical Provider, MD  lisinopril (PRINIVIL,ZESTRIL) 10 MG tablet Take 10 mg by mouth daily.   Yes Historical Provider, MD  loperamide (IMODIUM) 2 MG capsule Take 2 mg by mouth as needed for diarrhea or loose stools.   Yes Historical  Provider, MD  meclizine (ANTIVERT) 25 MG tablet Take 25 mg by mouth 3 (three) times daily as needed for dizziness.   Yes Historical Provider, MD  metoprolol (LOPRESSOR) 50 MG tablet Take 50 mg by mouth 2 (two) times daily.   Yes Historical Provider, MD  Multiple Vitamins-Minerals (MULTIVITAMIN PO) Take 1 tablet by mouth daily.   Yes Historical Provider, MD  potassium chloride (K-DUR) 10 MEQ tablet Take 20 mEq by mouth at bedtime.   Yes Historical Provider, MD  vitamin B-12 (CYANOCOBALAMIN) 1000 MCG tablet Take 1,000 mcg by mouth daily.   Yes Historical Provider, MD    Current Facility-Administered Medications  Medication Dose Route Frequency Provider Last Rate Last Dose  . 0.9 %  sodium chloride infusion   Intravenous Continuous Charlynne Cousins, MD 75 mL/hr at 03/20/14 1131    . aspirin chewable tablet 81 mg  81 mg Oral Daily Etta Quill, DO   81 mg at 03/20/14 0930  . cholecalciferol (VITAMIN D) tablet 1,000 Units  1,000 Units Oral Daily Etta Quill, DO   1,000 Units at 03/20/14 0930  . donepezil (ARICEPT) tablet 10 mg  10 mg Oral QHS Etta Quill, DO   10 mg at 03/20/14 2130  . Influenza vac split quadrivalent PF (FLUARIX) injection  0.5 mL  0.5 mL Intramuscular Tomorrow-1000 Etta Quill, DO      . lisinopril (PRINIVIL,ZESTRIL) tablet 10 mg  10 mg Oral Daily Etta Quill, DO   10 mg at 03/20/14 0930  . loperamide (IMODIUM) capsule 2 mg  2 mg Oral PRN Etta Quill, DO      . meclizine (ANTIVERT) tablet 25 mg  25 mg Oral TID PRN Etta Quill, DO      . metoprolol tartrate (LOPRESSOR) tablet 50 mg  50 mg Oral BID Etta Quill, DO   50 mg at 03/20/14 2130  . potassium chloride (K-DUR) CR tablet 20 mEq  20 mEq Oral QHS Etta Quill, DO   20 mEq at 03/20/14 2130  . sodium chloride 0.9 % injection 3 mL  3 mL Intravenous Q12H Etta Quill, DO   3 mL at 03/20/14 0000  . vitamin B-12 (CYANOCOBALAMIN) tablet 1,000 mcg  1,000 mcg Oral Daily Etta Quill, DO   1,000  mcg at 03/20/14 0930    Allergies as of 03/19/2014 - Review Complete 03/19/2014  Allergen Reaction Noted  . Alendronate sodium  03/05/2011  . Amlodipine besylate Hives 03/16/2007  . Calcitonin (salmon) Other (See Comments) 07/23/2010  . Simvastatin Other (See Comments) 03/05/2011    Family History  Problem Relation Age of Onset  . Heart attack Father   . Hypertension Father   . Heart attack Mother   . Heart attack Father   . Throat cancer Brother     History   Social History  . Marital Status: Widowed    Spouse Name: N/A    Number of Children: 45  . Years of Education: N/A   Occupational History  . retired    Social History Main Topics  . Smoking status: Never Smoker   . Smokeless tobacco: Never Used  . Alcohol Use: No  . Drug Use: No  . Sexual Activity: Not on file   Other Topics Concern  . Not on file   Social History Narrative   Has 4 brothers, 1 sister   Lives with 2 teenage grand kids and her daughter   Has 4 daughters, 1 son    Review of Systems: Gen: Denies any fever, chills, sweats, anorexia, fatigue, weakness, malaise, weight loss, and sleep disorder CV: Denies chest pain, angina, palpitations, syncope, orthopnea, PND, peripheral edema, and claudication. Resp: Denies dyspnea at rest, dyspnea with exercise, cough, sputum, wheezing, coughing up blood, and pleurisy. GI: Denies vomiting blood, jaundice, and fecal incontinence.   Denies dysphagia or odynophagia. GU : Denies urinary burning, blood in urine, urinary frequency, urinary hesitancy, nocturnal urination, and urinary incontinence. MS: Denies joint pain, limitation of movement, and swelling, stiffness, low back pain, extremity pain. Denies muscle weakness, cramps, atrophy.  Derm: Denies rash, itching, dry skin, hives, moles, warts, or unhealing ulcers.  Psych: Denies depression, anxiety, memory loss, suicidal ideation, hallucinations, paranoia, and confusion. Heme: Denies bruising, bleeding, and  enlarged lymph nodes. Neuro:  Denies any headaches, dizziness, paresthesias. Endo:  Denies any problems with DM, thyroid, adrenal function.  Physical Exam: Vital signs in last 24 hours: Temp:  [97.8 F (36.6 C)-98.7 F (37.1 C)] 98.7 F (37.1 C) (09/21 0500) Pulse Rate:  [58-66] 58 (09/21 0500) Resp:  [18] 18 (09/21 0500) BP: (134-209)/(69-96) 180/86 mmHg (09/21 0625) SpO2:  [99 %-100 %] 99 % (09/21 0500) Weight:  [148 lb 3.2 oz (67.223 kg)] 148 lb 3.2 oz (67.223 kg) (09/21 0500) Last BM Date:  03/21/14 General:   Alert,  Well-developed, well-nourished, female, pleasant and cooperative in NAD Head:  Normocephalic and atraumatic. Eyes:  Sclera clear, no icterus.   Conjunctiva pink. Ears:  Normal auditory acuity. Nose:  No deformity, discharge,  or lesions. Mouth:  No deformity or lesions.   Neck:  Supple; no masses or thyromegaly. Lungs:  Clear throughout to auscultation.   No wheezes, crackles, or rhonchi . Heart:  Regular rate and rhythm; no murmurs, clicks, rubs,  or gallops. Abdomen:  Soft,mild tenderness throughout , BS active,nonpalp mass or hsm.   Rectal: dark burgundy stool, guiac positive, large hemorrhoids Msk:  Symmetrical without gross deformities. . Pulses:  Normal pulses noted. Extremities:  Without clubbing or edema. Neurologic:  Alert and  oriented x4;  grossly normal neurologically. Skin:  Intact without significant lesions or rashes.. Psych:  Alert and cooperative. Normal mood and affect.  Intake/Output from previous day: 09/20 0701 - 09/21 0700 In: 2085.4 [P.O.:120; I.V.:1965.4] Out: 1000 [Urine:900; Stool:100] Intake/Output this shift:    Lab Results:  Recent Labs  03/19/14 1945 03/20/14 0300 03/21/14 0655  WBC 5.2 4.9 3.8*  HGB 11.5* 10.2* 9.1*  HCT 34.6* 30.4* 27.3*  PLT 172 174 152   BMET  Recent Labs  03/19/14 1945 03/20/14 0300 03/21/14 0655  NA 141 141 146  K 4.0 3.9 4.2  CL 104 107 114*  CO2 23 22 23   GLUCOSE 132* 97 89  BUN  24* 24* 23  CREATININE 1.03 0.94 0.95  CALCIUM 9.9 9.0 8.6     Studies/Results  Study Result from 2013    *RADIOLOGY REPORT*  Clinical Data: Abdominal pain. Diarrhea.  CT ABDOMEN AND PELVIS WITH CONTRAST 11/07/2011 Technique: Multidetector CT imaging of the abdomen and pelvis was  performed following the standard protocol during bolus  administration of intravenous contrast.  Contrast: 153mL OMNIPAQUE IOHEXOL 300 MG/ML SOLN  Comparison: 01/09/2011 [place].  Findings: Small amount pericardial fluid or thickening is present  posteriorly. Moderate hiatal hernia. Visceral atherosclerosis is  present. Calcified gallstones are present in the gallbladder.  Longest axis measurement of gallstones is 35 mm. In the right  hepatic lobe adjacent to the falciform ligament, there is a  cavernous hemangioma that measures 4 cm x 3 cm. Differences in  technique account for different measurement from prior US.  The adrenal glands are normal. Normal renal enhancement  bilaterally. 35 mm right interpolar simple renal cyst. Delayed  excretion of contrast in the kidneys appears normal. The pancreas  appears normal. No calcified common duct stones.  There is colitis extending from mid transverse colon to the rectum.  Fluid levels are present within the colon. The proximal colon is  distended with stool without mural thickening likely secondary to  colonic ileus from colitis.  No aggressive osseous lesions. Chronic T11 and L4 compression  fractures without significant retropulsion. Lumbar facet arthrosis  with grade 1 anterolisthesis of L4 on L5.  Hysterectomy. Per CMS PQRS reporting requirements (PQRS Measure  24): Given the patient's age of greater than 103 and the fracture  site (hip, distal radius, or spine), the patient should be tested  for osteoporosis using DXA, and the appropriate treatment  considered based on the DXA results. Colonic diverticulosis is  incidentally noted in the inflamed sigmoid  colon. Fat containing  periumbilical hernia noted.  IMPRESSION:  1. Colitis extending from transverse colon to the rectum.  Differential considerations include infection, inflammatory bowel  disease and ischemia. Notably, the superior mesenteric and  inferior mesenteric arteries  are patent.  2. Cholelithiasis.  3. Hiatal hernia.  4. Right hepatic lobe cavernous hemangioma.  5. Hysterectomy.  6. Age indeterminant thoracolumbar compression fractures and  spondylosis.  7. Right renal cyst.      IMPRESSION/PLAN: 1. Anemia, melena. H/H drifting down since admission. Pt had dark stools this a.m. Will plan on EGD today to eval for possible UGI source of blood loss, possible colonoscopy tomorrow depending on findings of EGD. Will check c diff as well if not ordered. Continue to monitor cbc. 2. Near syncopal episode-- was likely due to her diarrhea and nausea, which lead to dehydration or vasovagal episode.   Hvozdovic, Deloris Ping 03/21/2014,  Pager [phone]  ________________________________________________________________________  Velora Heckler GI MD note:  I personally examined the patient, reviewed the data and agree with the assessment and plan described above.  Planning on EGD today.   Owens Loffler, MD [facility] Gastroenterology Pager [phone]

## 2014-03-21 NOTE — H&P (View-Only) (Signed)
Referring Provider: [facility] Primary Care Physician:  Loura Pardon, MD Primary Gastroenterologist:  unassigned  Reason for Consultation:   Diarrhea, melena, hematochezia     HPI: Wendy Krueger is a 80 y.o. female who was admitted through ER Sat after having a near syncopal episode. Pt says she had been feeling well til that day. Sat she developed diarrhea that she says was pitch black She also felt nauseous Saturday.. She had been having BRBPR for several weeks, with blood on toilet tissue, blood on stool and blood dripping in to commode. She has otherwise had no change in her bowel habits, no anorexia, no wt loss.Pt says she has never had a colonoscopy or EGD. Pt says she had colitis in 2013 but is unclear to etiology, daughter thinks it was infectious.   Past Medical History  Diagnosis Date  . Hypertension   . Osteoarthritis   . Osteoporosis   . Hyperlipidemia   . Colitis - presumed infectious origin     one ER visit   . Asymptomatic gallstones     Past Surgical History  Procedure Laterality Date  . Abdominal hysterectomy      BSO- fibroids  . Wrist fracture surgery  2/01    R arm  . Abi's      normal  . Left foot brace    . Dexa  1/05    osteoporosis    Prior to Admission medications   Medication Sig Start Date End Date Taking? Authorizing Provider  aspirin 81 MG chewable tablet Chew 81 mg by mouth daily.   Yes Historical Provider, MD  cholecalciferol (VITAMIN D) 1000 UNITS tablet Take 1,000 Units by mouth daily.   Yes Historical Provider, MD  docusate sodium (COLACE) 100 MG capsule Take 100 mg by mouth 2 (two) times daily.    Yes Historical Provider, MD  donepezil (ARICEPT) 10 MG tablet Take 10 mg by mouth at bedtime.   Yes Historical Provider, MD  lisinopril (PRINIVIL,ZESTRIL) 10 MG tablet Take 10 mg by mouth daily.   Yes Historical Provider, MD  loperamide (IMODIUM) 2 MG capsule Take 2 mg by mouth as needed for diarrhea or loose stools.   Yes Historical  Provider, MD  meclizine (ANTIVERT) 25 MG tablet Take 25 mg by mouth 3 (three) times daily as needed for dizziness.   Yes Historical Provider, MD  metoprolol (LOPRESSOR) 50 MG tablet Take 50 mg by mouth 2 (two) times daily.   Yes Historical Provider, MD  Multiple Vitamins-Minerals (MULTIVITAMIN PO) Take 1 tablet by mouth daily.   Yes Historical Provider, MD  potassium chloride (K-DUR) 10 MEQ tablet Take 20 mEq by mouth at bedtime.   Yes Historical Provider, MD  vitamin B-12 (CYANOCOBALAMIN) 1000 MCG tablet Take 1,000 mcg by mouth daily.   Yes Historical Provider, MD    Current Facility-Administered Medications  Medication Dose Route Frequency Provider Last Rate Last Dose  . 0.9 %  sodium chloride infusion   Intravenous Continuous Charlynne Cousins, MD 75 mL/hr at 03/20/14 1131    . aspirin chewable tablet 81 mg  81 mg Oral Daily Etta Quill, DO   81 mg at 03/20/14 0930  . cholecalciferol (VITAMIN D) tablet 1,000 Units  1,000 Units Oral Daily Etta Quill, DO   1,000 Units at 03/20/14 0930  . donepezil (ARICEPT) tablet 10 mg  10 mg Oral QHS Etta Quill, DO   10 mg at 03/20/14 2130  . Influenza vac split quadrivalent PF (FLUARIX) injection  0.5 mL  0.5 mL Intramuscular Tomorrow-1000 Etta Quill, DO      . lisinopril (PRINIVIL,ZESTRIL) tablet 10 mg  10 mg Oral Daily Etta Quill, DO   10 mg at 03/20/14 0930  . loperamide (IMODIUM) capsule 2 mg  2 mg Oral PRN Etta Quill, DO      . meclizine (ANTIVERT) tablet 25 mg  25 mg Oral TID PRN Etta Quill, DO      . metoprolol tartrate (LOPRESSOR) tablet 50 mg  50 mg Oral BID Etta Quill, DO   50 mg at 03/20/14 2130  . potassium chloride (K-DUR) CR tablet 20 mEq  20 mEq Oral QHS Etta Quill, DO   20 mEq at 03/20/14 2130  . sodium chloride 0.9 % injection 3 mL  3 mL Intravenous Q12H Etta Quill, DO   3 mL at 03/20/14 0000  . vitamin B-12 (CYANOCOBALAMIN) tablet 1,000 mcg  1,000 mcg Oral Daily Etta Quill, DO   1,000  mcg at 03/20/14 0930    Allergies as of 03/19/2014 - Review Complete 03/19/2014  Allergen Reaction Noted  . Alendronate sodium  03/05/2011  . Amlodipine besylate Hives 03/16/2007  . Calcitonin (salmon) Other (See Comments) 07/23/2010  . Simvastatin Other (See Comments) 03/05/2011    Family History  Problem Relation Age of Onset  . Heart attack Father   . Hypertension Father   . Heart attack Mother   . Heart attack Father   . Throat cancer Brother     History   Social History  . Marital Status: Widowed    Spouse Name: N/A    Number of Children: 74  . Years of Education: N/A   Occupational History  . retired    Social History Main Topics  . Smoking status: Never Smoker   . Smokeless tobacco: Never Used  . Alcohol Use: No  . Drug Use: No  . Sexual Activity: Not on file   Other Topics Concern  . Not on file   Social History Narrative   Has 4 brothers, 1 sister   Lives with 2 teenage grand kids and her daughter   Has 4 daughters, 1 son    Review of Systems: Gen: Denies any fever, chills, sweats, anorexia, fatigue, weakness, malaise, weight loss, and sleep disorder CV: Denies chest pain, angina, palpitations, syncope, orthopnea, PND, peripheral edema, and claudication. Resp: Denies dyspnea at rest, dyspnea with exercise, cough, sputum, wheezing, coughing up blood, and pleurisy. GI: Denies vomiting blood, jaundice, and fecal incontinence.   Denies dysphagia or odynophagia. GU : Denies urinary burning, blood in urine, urinary frequency, urinary hesitancy, nocturnal urination, and urinary incontinence. MS: Denies joint pain, limitation of movement, and swelling, stiffness, low back pain, extremity pain. Denies muscle weakness, cramps, atrophy.  Derm: Denies rash, itching, dry skin, hives, moles, warts, or unhealing ulcers.  Psych: Denies depression, anxiety, memory loss, suicidal ideation, hallucinations, paranoia, and confusion. Heme: Denies bruising, bleeding, and  enlarged lymph nodes. Neuro:  Denies any headaches, dizziness, paresthesias. Endo:  Denies any problems with DM, thyroid, adrenal function.  Physical Exam: Vital signs in last 24 hours: Temp:  [97.8 F (36.6 C)-98.7 F (37.1 C)] 98.7 F (37.1 C) (09/21 0500) Pulse Rate:  [58-66] 58 (09/21 0500) Resp:  [18] 18 (09/21 0500) BP: (134-209)/(69-96) 180/86 mmHg (09/21 0625) SpO2:  [99 %-100 %] 99 % (09/21 0500) Weight:  [148 lb 3.2 oz (67.223 kg)] 148 lb 3.2 oz (67.223 kg) (09/21 0500) Last BM Date:  03/21/14 General:   Alert,  Well-developed, well-nourished, female, pleasant and cooperative in NAD Head:  Normocephalic and atraumatic. Eyes:  Sclera clear, no icterus.   Conjunctiva pink. Ears:  Normal auditory acuity. Nose:  No deformity, discharge,  or lesions. Mouth:  No deformity or lesions.   Neck:  Supple; no masses or thyromegaly. Lungs:  Clear throughout to auscultation.   No wheezes, crackles, or rhonchi . Heart:  Regular rate and rhythm; no murmurs, clicks, rubs,  or gallops. Abdomen:  Soft,mild tenderness throughout , BS active,nonpalp mass or hsm.   Rectal: dark burgundy stool, guiac positive, large hemorrhoids Msk:  Symmetrical without gross deformities. . Pulses:  Normal pulses noted. Extremities:  Without clubbing or edema. Neurologic:  Alert and  oriented x4;  grossly normal neurologically. Skin:  Intact without significant lesions or rashes.. Psych:  Alert and cooperative. Normal mood and affect.  Intake/Output from previous day: 09/20 0701 - 09/21 0700 In: 2085.4 [P.O.:120; I.V.:1965.4] Out: 1000 [Urine:900; Stool:100] Intake/Output this shift:    Lab Results:  Recent Labs  03/19/14 1945 03/20/14 0300 03/21/14 0655  WBC 5.2 4.9 3.8*  HGB 11.5* 10.2* 9.1*  HCT 34.6* 30.4* 27.3*  PLT 172 174 152   BMET  Recent Labs  03/19/14 1945 03/20/14 0300 03/21/14 0655  NA 141 141 146  K 4.0 3.9 4.2  CL 104 107 114*  CO2 23 22 23   GLUCOSE 132* 97 89  BUN  24* 24* 23  CREATININE 1.03 0.94 0.95  CALCIUM 9.9 9.0 8.6     Studies/Results  Study Result from 2013    *RADIOLOGY REPORT*  Clinical Data: Abdominal pain. Diarrhea.  CT ABDOMEN AND PELVIS WITH CONTRAST 11/07/2011 Technique: Multidetector CT imaging of the abdomen and pelvis was  performed following the standard protocol during bolus  administration of intravenous contrast.  Contrast: 155mL OMNIPAQUE IOHEXOL 300 MG/ML SOLN  Comparison: 01/09/2011 [place].  Findings: Small amount pericardial fluid or thickening is present  posteriorly. Moderate hiatal hernia. Visceral atherosclerosis is  present. Calcified gallstones are present in the gallbladder.  Longest axis measurement of gallstones is 35 mm. In the right  hepatic lobe adjacent to the falciform ligament, there is a  cavernous hemangioma that measures 4 cm x 3 cm. Differences in  technique account for different measurement from prior US.  The adrenal glands are normal. Normal renal enhancement  bilaterally. 35 mm right interpolar simple renal cyst. Delayed  excretion of contrast in the kidneys appears normal. The pancreas  appears normal. No calcified common duct stones.  There is colitis extending from mid transverse colon to the rectum.  Fluid levels are present within the colon. The proximal colon is  distended with stool without mural thickening likely secondary to  colonic ileus from colitis.  No aggressive osseous lesions. Chronic T11 and L4 compression  fractures without significant retropulsion. Lumbar facet arthrosis  with grade 1 anterolisthesis of L4 on L5.  Hysterectomy. Per CMS PQRS reporting requirements (PQRS Measure  24): Given the patient's age of greater than 49 and the fracture  site (hip, distal radius, or spine), the patient should be tested  for osteoporosis using DXA, and the appropriate treatment  considered based on the DXA results. Colonic diverticulosis is  incidentally noted in the inflamed sigmoid  colon. Fat containing  periumbilical hernia noted.  IMPRESSION:  1. Colitis extending from transverse colon to the rectum.  Differential considerations include infection, inflammatory bowel  disease and ischemia. Notably, the superior mesenteric and  inferior mesenteric arteries  are patent.  2. Cholelithiasis.  3. Hiatal hernia.  4. Right hepatic lobe cavernous hemangioma.  5. Hysterectomy.  6. Age indeterminant thoracolumbar compression fractures and  spondylosis.  7. Right renal cyst.      IMPRESSION/PLAN: 1. Anemia, melena. H/H drifting down since admission. Pt had dark stools this a.m. Will plan on EGD today to eval for possible UGI source of blood loss, possible colonoscopy tomorrow depending on findings of EGD. Will check c diff as well if not ordered. Continue to monitor cbc. 2. Near syncopal episode-- was likely due to her diarrhea and nausea, which lead to dehydration or vasovagal episode.   Hvozdovic, Deloris Ping 03/21/2014,  Pager [phone]  ________________________________________________________________________  Velora Heckler GI MD note:  I personally examined the patient, reviewed the data and agree with the assessment and plan described above.  Planning on EGD today.   Owens Loffler, MD [facility] Gastroenterology Pager [phone]

## 2014-03-21 NOTE — Op Note (Signed)
[facility] [place], [id number]   ENDOSCOPY PROCEDURE REPORT  PATIENT: Wendy, Krueger  MR#: [id number] BIRTHDATE: 1929-08-15 , 78  yrs. old GENDER: female ENDOSCOPIST: Milus Banister, MD PROCEDURE DATE:  03/21/2014 PROCEDURE:  EGD w/ biopsy ASA CLASS:     Class III INDICATIONS:  GI bleeding, black stools recently. MEDICATIONS: Monitored anesthesia care TOPICAL ANESTHETIC: none  DESCRIPTION OF PROCEDURE: After the risks benefits and alternatives of the procedure were thoroughly explained, informed consent was obtained.  The EUS scope [id number] endoscope was introduced through the mouth and advanced to the second portion of the duodenum. Without limitations.  The instrument was slowly withdrawn as the mucosa was fully examined.   There was moderate distal gastritis and bulbar duodenitis, including several erosions at both sites, very friable mucosa.  Biopsies were taken from distal stomach and sent to pathology.  There was a 4cm hiatal hernia with associated Cameron's erosions.  There was a benign appearing Schatzki's ring that was not dilated.  The examination was otherwise normal.  Retroflexed views revealed no abnormalities and Retroflexed views revealed .     The scope was then withdrawn from the patient and the procedure completed. COMPLICATIONS: There were no complications.  ENDOSCOPIC IMPRESSION: There was moderate distal gastritis and bulbar duodenitis, including several erosions at both sites, very friable mucosa.  Biopsies were taken from distal stomach and sent to pathology.  There was a 4cm hiatal hernia with associated Cameron's erosions.  There was a benign appearing Schatzki's ring that was not dilated.  The examination was otherwise normal  RECOMMENDATIONS: The findings in UGI tract likely explain her GI bleeding.  Perhaps from "unopposed" daily aspirin? She will start twice daily PPI and continue for at least 4-6  weeks.  If biopsies show H.  pylori then she will be started on appropriate antibiotics.  No need for colonoscopy at this point.    eSigned:  Milus Banister, MD 03/21/2014 11:18 AM   [id number] [place], MD

## 2014-03-21 NOTE — Interval H&P Note (Signed)
History and Physical Interval Note:  03/21/2014 10:44 AM  Wilmer Floor  has presented today for surgery, with the diagnosis of anemia, melena  The various methods of treatment have been discussed with the patient and family. After consideration of risks, benefits and other options for treatment, the patient has consented to  Procedure(s): ESOPHAGOGASTRODUODENOSCOPY (EGD) (N/A) as a surgical intervention .  The patient's history has been reviewed, patient examined, no change in status, stable for surgery.  I have reviewed the patient's chart and labs.  Questions were answered to the patient's satisfaction.     Milus Banister

## 2014-03-21 NOTE — Anesthesia Postprocedure Evaluation (Signed)
  Anesthesia Post-op Note  Patient: Wendy Krueger  Procedure(s) Performed: Procedure(s): ESOPHAGOGASTRODUODENOSCOPY (EGD) (N/A)  Patient Location: PACU  Anesthesia Type:MAC  Level of Consciousness: awake, alert , oriented and patient cooperative  Airway and Oxygen Therapy: Patient Spontanous Breathing  Post-op Pain: none  Post-op Assessment: Post-op Vital signs reviewed, Patient's Cardiovascular Status Stable, Respiratory Function Stable, Patent Airway and No signs of Nausea or vomiting  Post-op Vital Signs: stable  Last Vitals:  Filed Vitals:   03/21/14 1147  BP: 165/90  Pulse: 80  Temp:   Resp: 18    Complications: No apparent anesthesia complications

## 2014-03-21 NOTE — Transfer of Care (Signed)
Immediate Anesthesia Transfer of Care Note  Patient: Wendy Krueger  Procedure(s) Performed: Procedure(s): ESOPHAGOGASTRODUODENOSCOPY (EGD) (N/A)  Patient Location: PACU and Endoscopy Unit  Anesthesia Type:MAC  Level of Consciousness: awake, patient cooperative and lethargic  Airway & Oxygen Therapy: Patient Spontanous Breathing and Patient connected to nasal cannula oxygen  Post-op Assessment: Report given to PACU RN and Post -op Vital signs reviewed and stable  Post vital signs: Reviewed and stable  Complications: No apparent anesthesia complications

## 2014-03-21 NOTE — Interval H&P Note (Signed)
History and Physical Interval Note:  03/21/2014 10:45 AM  Wendy Krueger  has presented today for surgery, with the diagnosis of anemia, melena  The various methods of treatment have been discussed with the patient and family. After consideration of risks, benefits and other options for treatment, the patient has consented to  Procedure(s): ESOPHAGOGASTRODUODENOSCOPY (EGD) (N/A) as a surgical intervention .  The patient's history has been reviewed, patient examined, no change in status, stable for surgery.  I have reviewed the patient's chart and labs.  Questions were answered to the patient's satisfaction.     Milus Banister

## 2014-03-21 NOTE — Progress Notes (Signed)
-   I have reviewed the data and agree with plan as above. - EGD recently done that showed moderate distal gastritis and bulbar duodenitis. - Start her empirically on Protonix twice a day. Appreciate GIs assistance. Continue check CBC in the morning.

## 2014-03-21 NOTE — Progress Notes (Signed)
Pt up to bathroom this am with very large dark bloody stools. Pt states she is feeling weak this am. BP 180/86 upon return to bed. Fredirick Maudlin, NP notified of above. Will cont to monitor.

## 2014-03-21 NOTE — Anesthesia Preprocedure Evaluation (Signed)
Anesthesia Evaluation  Patient identified by MRN, date of birth, ID band Patient awake    Reviewed: Allergy & Precautions, H&P , NPO status , Patient's Chart, lab work & pertinent test results  Airway       Dental   Pulmonary          Cardiovascular hypertension,     Neuro/Psych CVA, Residual Symptoms    GI/Hepatic   Endo/Other    Renal/GU Renal InsufficiencyRenal disease     Musculoskeletal  (+) Arthritis -,   Abdominal   Peds  Hematology   Anesthesia Other Findings   Reproductive/Obstetrics                           Anesthesia Physical Anesthesia Plan  ASA: III  Anesthesia Plan: MAC   Post-op Pain Management:    Induction: Intravenous  Airway Management Planned: Mask  Additional Equipment:   Intra-op Plan:   Post-operative Plan:   Informed Consent: I have reviewed the patients History and Physical, chart, labs and discussed the procedure including the risks, benefits and alternatives for the proposed anesthesia with the patient or authorized representative who has indicated his/her understanding and acceptance.     Plan Discussed with: CRNA, Anesthesiologist and Surgeon  Anesthesia Plan Comments:         Anesthesia Quick Evaluation

## 2014-03-21 NOTE — Progress Notes (Signed)
CRITICAL VALUE ALERT  Critical value received:  Positive Cdiff  Date of notification:  03/21/2014  Time of notification:  17:55  Critical value read back:Yes.    Nurse who received alert:  Neta Ehlers, RN   MD notified (1st page):  Dr. Aileen Fass   Time of first page:  17:56  MD notified (2nd page):  Time of second page:  Responding MD:  Dr. Aileen Fass  Time MD responded:  17:57

## 2014-03-21 NOTE — Anesthesia Procedure Notes (Signed)
Procedure Name: MAC Date/Time: 03/21/2014 11:04 AM Performed by: Ned Grace Pre-anesthesia Checklist: Patient identified, Patient being monitored, Emergency Drugs available, Timeout performed and Suction available Patient Re-evaluated:Patient Re-evaluated prior to inductionOxygen Delivery Method: Nasal cannula Preoxygenation: Pre-oxygenation with 100% oxygen Intubation Type: IV induction

## 2014-03-21 NOTE — Progress Notes (Signed)
[facility] PROGRESS NOTE  Wendy Krueger QJJ:[id number] DOB: May 26, 1930 DOA: 03/19/2014 PCP: Loura Pardon, MD  Assessment/Plan:  Near syncope -Likely due to dehydration or vasovagal episode secondary to diarrhea and nausea. -cardiac markers- negative, UA negative -EKG with LBBB- no previous to compare -orthostatic BP- no evidence of orthostatic hypotension -2D echo pending -continue telemetry  Melena -Episode of dark stools this morning, and history of BRBPR and +FOBT, Hgb  9.1 and downtrending -Appreciate GI following- plan for EGD to evaluated UGI source of bleeding, and possible colonscopy tomorrow.    Anemia -Hgb 9.1 and downtrending > 10.2 >11.5 -Continue to monitorr CBC  -continue Vitamin B12 -Obtain anemia panel   Diarrhea -No further episodes -cdiff pending -Imodium PRN  Hypertension -BP stable- previous episodes of hypotension -continue lisinopril and metoprolol  Dementia -Continue Aricept  Hypokalemia -Potassium stable -continue to monitor BMET    DVT Prophylaxis:  SCDs Code Status: Full Family Communication: No family at bedise Disposition Plan: Inpatient   Consultants:  Gastroenterology  Procedures:  2D echo- pending  EGD- pending  Antibiotics:  None  HPI/Subjective:  Wendy Krueger is a 80 y.o.female, with PMH of HTN, hemorrhoids, and colitis, admitted 03/19/14, who presented to ED with episode of near syncope. She states she developed nausea and diarrhea which was black in color. She had been having BRBPR for several weeks, with blood on toilet tissue, on stool,  dripping in to commode, which she attributed to her hemorrhoids.  She denies any other changes in bowels, abdominal pain, loss of appetite, or weight loss.She says she has never had a colonoscopy or EGD.   Episode of dark bloody stool this am. Diarrhea resolved. No other syncopal episodes  Objective: Filed Vitals:   03/21/14 1018  BP: 199/94  Pulse: 69  Temp:  98.1 F (36.7 C)  Resp: 17    Intake/Output Summary (Last 24 hours) at 03/21/14 1021 Last data filed at 03/21/14 0600  Gross per 24 hour  Intake 2085.42 ml  Output    600 ml  Net 1485.42 ml   Filed Weights   03/20/14 0114 03/21/14 0500  Weight: 68.947 kg (152 lb) 67.223 kg (148 lb 3.2 oz)    Exam:  Gen: Alert and oriented female in NAD HEENT: Normocephalic, atraumatic.  Pupils symmertrical.  Moist mucosa.   Chest: clear to auscultate bilaterally, no ronchi or rales  Cardiac: Regular rate and rhythm, S1-S2, no rubs murmurs or gallops  Abdomen: soft, non tender, non distended, +bowel sounds. No guarding or rigidity  Extremities: Symmetrical in appearance without cyanosis or edema  Neurological: Alert awake oriented to time place and person.  Psychiatric: Appears normal.   Data Reviewed: Basic Metabolic Panel:  Recent Labs Lab 03/19/14 1945 03/20/14 0300 03/21/14 0655  NA 141 141 146  K 4.0 3.9 4.2  CL 104 107 114*  CO2 23 22 23   GLUCOSE 132* 97 89  BUN 24* 24* 23  CREATININE 1.03 0.94 0.95  CALCIUM 9.9 9.0 8.6   Liver Function Tests: No results found for this basename: AST, ALT, ALKPHOS, BILITOT, PROT, ALBUMIN,  in the last 168 hours No results found for this basename: LIPASE, AMYLASE,  in the last 168 hours No results found for this basename: AMMONIA,  in the last 168 hours CBC:  Recent Labs Lab 03/19/14 1945 03/20/14 0300 03/21/14 0655  WBC 5.2 4.9 3.8*  HGB 11.5* 10.2* 9.1*  HCT 34.6* 30.4* 27.3*  MCV 90.1 89.7 91.3  PLT 172 174 152  Cardiac Enzymes:  Recent Labs Lab 03/20/14 1225 03/20/14 1651 03/20/14 2324  TROPONINI <0.30 <0.30 <0.30   BNP (last 3 results) No results found for this basename: PROBNP,  in the last 8760 hours CBG: No results found for this basename: GLUCAP,  in the last 168 hours  No results found for this or any previous visit (from the past 240 hour(s)).   Studies: No results found.  Scheduled Meds: . Mikaela.Ping HOLD]  cholecalciferol  1,000 Units Oral Daily  . [MAR HOLD] donepezil  10 mg Oral QHS  . [MAR HOLD] Influenza vac split quadrivalent PF  0.5 mL Intramuscular Tomorrow-1000  . [MAR HOLD] lisinopril  10 mg Oral Daily  . [facility] HOLD] metoprolol  50 mg Oral BID  . [facility] HOLD] potassium chloride  20 mEq Oral QHS  . [MAR HOLD] sodium chloride  3 mL Intravenous Q12H  . [facility] HOLD] vitamin B-12  1,000 mcg Oral Daily   Continuous Infusions: . sodium chloride 75 mL/hr at 03/20/14 1131  . sodium chloride      Principal Problem:   Near syncope Active Problems:   BRBPR (bright red blood per rectum)   Diarrhea    Time spent: [place], [place] [facility]  [facility] Pager [phone]. If 7PM-7AM, please contact night-coverage at [email or web address], password [facility] 03/21/2014, 10:21 AM  LOS: 2 days

## 2014-03-21 NOTE — Progress Notes (Signed)
Echocardiogram 2D Echocardiogram has been performed.  Wendy Krueger 03/21/2014, 2:13 PM

## 2014-03-22 ENCOUNTER — Encounter (HOSPITAL_COMMUNITY): Payer: Self-pay | Admitting: Gastroenterology

## 2014-03-22 DIAGNOSIS — K5289 Other specified noninfective gastroenteritis and colitis: Secondary | ICD-10-CM

## 2014-03-22 DIAGNOSIS — K921 Melena: Secondary | ICD-10-CM

## 2014-03-22 LAB — FOLATE: Folate: >20 ng/mL

## 2014-03-22 LAB — CBC
HCT: 23.5 % — ABNORMAL LOW (ref 36.0–46.0)
Hemoglobin: 7.6 g/dL — ABNORMAL LOW (ref 12.0–15.0)
MCH: 29.3 pg (ref 26.0–34.0)
MCHC: 32.3 g/dL (ref 30.0–36.0)
MCV: 90.7 fL (ref 78.0–100.0)
PLATELETS: 154 10*3/uL (ref 150–400)
RBC: 2.59 MIL/uL — AB (ref 3.87–5.11)
RDW: 13.3 % (ref 11.5–15.5)
WBC: 5.1 10*3/uL (ref 4.0–10.5)

## 2014-03-22 LAB — PREPARE RBC (CROSSMATCH)

## 2014-03-22 LAB — FERRITIN: Ferritin: 48 ng/mL (ref 10–291)

## 2014-03-22 LAB — VITAMIN B12: Vitamin B-12: 1228 pg/mL — ABNORMAL HIGH (ref 211–911)

## 2014-03-22 MED ORDER — ASPIRIN 81 MG PO CHEW
81.0000 mg | CHEWABLE_TABLET | Freq: Every day | ORAL | Status: DC
  Administered 2014-03-23: 81 mg via ORAL
  Filled 2014-03-22: qty 1

## 2014-03-22 MED ORDER — SODIUM CHLORIDE 0.9 % IV SOLN
Freq: Once | INTRAVENOUS | Status: AC
  Administered 2014-03-22: 11:00:00 via INTRAVENOUS

## 2014-03-22 MED ORDER — ASPIRIN 325 MG PO TABS
325.0000 mg | ORAL_TABLET | Freq: Every day | ORAL | Status: DC
  Filled 2014-03-22: qty 1

## 2014-03-22 MED ORDER — FERROUS SULFATE 325 (65 FE) MG PO TABS
325.0000 mg | ORAL_TABLET | Freq: Every day | ORAL | Status: DC
  Administered 2014-03-22 – 2014-03-23 (×2): 325 mg via ORAL
  Filled 2014-03-22 (×3): qty 1

## 2014-03-22 MED ORDER — SACCHAROMYCES BOULARDII 250 MG PO CAPS
250.0000 mg | ORAL_CAPSULE | Freq: Two times a day (BID) | ORAL | Status: DC
  Administered 2014-03-22 (×2): 250 mg via ORAL
  Filled 2014-03-22 (×4): qty 1

## 2014-03-22 NOTE — Progress Notes (Signed)
-   And agree with plan and the as above. - Start Flagyl for C. difficile colitis. - Orthostatics has resolved. Mono was probably due to duodenitis, started on protonic 40 mg by mouth twice a day. - Status post EGD that showed gastritis and duodenitis. H. pylori test pending which will be followed up as an outpatient

## 2014-03-22 NOTE — Progress Notes (Signed)
[facility] PROGRESS NOTE  PARADISE VENSEL MPN:[id number] DOB: [birth year] DOA: 03/19/2014 PCP: Loura Pardon, MD  Assessment/Plan:  Diarrhea -Presented with bloody diarrhea-No further episode of diarrhea -Cdiff positive stool. -No identifiable risk factor of C diff- no recent hospitalization, antibiotic/steroid use, or known exposure to Cdiff -Placed on flagyl 500 mg PO TID (day2).  Will continue for total of 2 weeks  Near syncope  -Likely due to dehydration or vasovagal episode secondary to diarrhea and nausea- no other episodes -cardiac markers- negative, UA negative, no evidence of orthostatic hypotension -2D echo pending- EF 60%, LVH, and grade I diastolic dysfunction; EKG with LBBB- no previous to compare  -continue telemetry -Will schedule outpatient cardiology follow up on discharge  Melena  -Probably from gastritis, duodenitis, or possibly unopposed Aspirin. - Had an episode of dark stools yesterday, history of BRBPR and +FOBT- no other episodes of BRBPR.   - Hgb downtrended to 7.6- transfused I unit of PRBC -EGD-moderate distal gastritis and bulbar duodenitis- placed on Pantoperazole 500 mg BID.  -Appreciate GI following- continue PPIx 6 weeks, then transition to once daily. If biopsies positive for Hpylori, will add antibiotic. Aspirin, since now on PPI BID.  -resumed home regimen of Aspirin 81 mg PO  Anemia  -Hgb 7.6- patient denies any dizziness, shortness of breath, or weakness -transfused one unit of PRBC -placed on oral iron supplements daily -Continue to monitorr CBC  -Vitamin B12 elevated at 1228- discontinue Vit B12 supplements  Hypertension  -BP stable- previous episodes of hypotension that have resolved  -continue lisinopril and metoprolol   Dementia  -Continue Aricept   Hypokalemia  -Potassium stable  -continue to monitor BMET   DVT Prophylaxis SCDs Code Status: Full Family Communication: Daughter at bedside Disposition Plan: Inpatient; howe  when stable   Consultants:  Gastroenterology  Procedures:  EGD-There was moderate distal gastritis and bulbar duodenitis, including several erosions at both  sites, very friable mucosa.  Biopsies were taken from distal stomach and sent to pathology. There  was a 4cm hiatal hernia with associated Cameron's erosions. There was a benign appearing  Schatzki's ring that was not dilated. The examination was otherwise normal   2D echo-  EF 60%; Wall thickness increased in a pattern of moderate LVH. The estimated ejection  fraction was 60%. Doppler parameters are consistent with abnormal left ventricular relaxation (grade  1 diastolic Dysfunction).  Aortic valve sclerosis without stenosis.  Mild dilitation of the ascending aorta.  Systolic function is normal  Antibiotics:  Metronidazole (day 2)   HPI/Subjective: Wendy Krueger is a 80 y.o.female, with PMH of HTN, hemorrhoids, and colitis, admitted 03/19/14, who presented to ED with episode of near syncope. She states she developed nausea and diarrhea which was black in color. She had been having BRBPR for several weeks, with blood on toilet tissue, on stool, dripping in to commode, which she attributed to her hemorrhoids. She denies any other changes in bowels, abdominal pain, loss of appetite, or weight loss.She says she has never had a colonoscopy or EGD.   NO complaints. Denies any other episode of diarrhea.  Denies dizziness, sob, chest pain. Objective: Filed Vitals:   03/22/14 1014  BP:   Pulse: 72  Temp:   Resp:     Intake/Output Summary (Last 24 hours) at 03/22/14 1300 Last data filed at 03/22/14 0086  Gross per 24 hour  Intake 1075.5 ml  Output    600 ml  Net  475.5 ml   Filed Weights   03/20/14  0114 03/21/14 0500 03/22/14 0555  Weight: 68.947 kg (152 lb) 67.223 kg (148 lb 3.2 oz) 67.3 kg (148 lb 5.9 oz)    Exam:  Gen: Alert and oriented Caucasian female in NAD.  Looks pale HEENT: Normocephalic, atraumatic. Pupils  symmertrical. Moist mucosa.   Chest: clear to auscultate bilaterally, no ronchi or rales  Cardiac: Regular rate and rhythm, S1-S2, no rubs murmurs or gallops  Abdomen: soft, non tender, non distended, +bowel sounds. No guarding or rigidity  Extremities: Symmetrical in appearance without cyanosis or edema  Neurological: Alert awake oriented to time place and person.  Psychiatric: Appears normal.   Data Reviewed: Basic Metabolic Panel:  Recent Labs Lab 03/19/14 1945 03/20/14 0300 03/21/14 0655  NA 141 141 146  K 4.0 3.9 4.2  CL 104 107 114*  CO2 23 22 23   GLUCOSE 132* 97 89  BUN 24* 24* 23  CREATININE 1.03 0.94 0.95  CALCIUM 9.9 9.0 8.6   Liver Function Tests: No results found for this basename: AST, ALT, ALKPHOS, BILITOT, PROT, ALBUMIN,  in the last 168 hours No results found for this basename: LIPASE, AMYLASE,  in the last 168 hours No results found for this basename: AMMONIA,  in the last 168 hours CBC:  Recent Labs Lab 03/19/14 1945 03/20/14 0300 03/21/14 0655 03/22/14 0500  WBC 5.2 4.9 3.8* 5.1  HGB 11.5* 10.2* 9.1* 7.6*  HCT 34.6* 30.4* 27.3* 23.5*  MCV 90.1 89.7 91.3 90.7  PLT 172 174 152 154   Cardiac Enzymes:  Recent Labs Lab 03/20/14 1225 03/20/14 1651 03/20/14 2324  TROPONINI <0.30 <0.30 <0.30   BNP (last 3 results) No results found for this basename: PROBNP,  in the last 8760 hours CBG: No results found for this basename: GLUCAP,  in the last 168 hours  Recent Results (from the past 240 hour(s))  STOOL CULTURE     Status: None   Collection Time    03/20/14  2:10 PM      Result Value Ref Range Status   Specimen Description STOOL   Final   Special Requests Normal   Final   Culture     Final   Value: NO SUSPICIOUS COLONIES, CONTINUING TO HOLD     Performed at [facility]   Report Status PENDING   Incomplete  CLOSTRIDIUM DIFFICILE BY PCR     Status: Abnormal   Collection Time    03/21/14  3:22 PM      Result Value Ref Range Status    C difficile by pcr POSITIVE (*) NEGATIVE Final   Comment: CRITICAL RESULT CALLED TO, READ BACK BY AND VERIFIED WITH:     Sheppard Evens RN 17:55 03/21/14 ([email or web address])     Studies: No results found.  Scheduled Meds: . cholecalciferol  1,000 Units Oral Daily  . donepezil  10 mg Oral QHS  . ferrous sulfate  325 mg Oral Q breakfast  . Influenza vac split quadrivalent PF  0.5 mL Intramuscular Tomorrow-1000  . lisinopril  10 mg Oral Daily  . metoprolol  50 mg Oral BID  . metroNIDAZOLE  500 mg Oral TID  . pantoprazole  40 mg Oral BID AC  . potassium chloride  20 mEq Oral QHS  . saccharomyces boulardii  250 mg Oral BID  . sodium chloride  3 mL Intravenous Q12H  . vitamin B-12  1,000 mcg Oral Daily   Continuous Infusions:   Principal Problem:   Near syncope Active Problems:   BRBPR (bright red blood per  rectum)   Diarrhea   Melena   GI bleed   Unspecified gastritis and gastroduodenitis without mention of hemorrhage    Time spent: [place], [place] [facility]  [facility] Pager [phone]. If 7PM-7AM, please contact night-coverage at [email or web address], password [facility] 03/22/2014, 1:00 PM  LOS: 3 days

## 2014-03-22 NOTE — Progress Notes (Signed)
     [place] [facility] Progress Note  Subjective:  Feels well today. Had a formed BM this morning (per pt). Stool for c diff positive. HgB this morning7.6. EGD yesterday revealed gastritis and bulbar duodenitis.Biopsies for H pylori pending.   Objective:  Vital signs in last 24 hours: Temp:  [97.8 F (36.6 C)-98.9 F (37.2 C)] 98.6 F (37 C) (09/22 0555) Pulse Rate:  [60-80] 60 (09/22 0555) Resp:  [12-18] 18 (09/22 0555) BP: (137-199)/(53-94) 137/53 mmHg (09/22 0555) SpO2:  [97 %-100 %] 97 % (09/22 0555) Weight:  [148 lb 5.9 oz (67.3 kg)] 148 lb 5.9 oz (67.3 kg) (09/22 0555) Last BM Date: 03/21/14 General:   Alert,  Well-developed, female   in NAD Heart:  Regular rate and rhythm; no murmurs Pulm lungs clear to ausc bilat Abdomen:  Soft, nontender and nondistended. Normal bowel sounds, without guarding, and without rebound.   Extremities:  Without edema. Neurologic:  Alert and  oriented x4;  grossly normal neurologically. Psych:  Alert and cooperative. Normal mood and affect.  Lab Results:  Recent Labs  03/20/14 0300 03/21/14 0655 03/22/14 0500  WBC 4.9 3.8* 5.1  HGB 10.2* 9.1* 7.6*  HCT 30.4* 27.3* 23.5*  PLT 174 152 154   BMET  Recent Labs  03/19/14 1945 03/20/14 0300 03/21/14 0655  NA 141 141 146  K 4.0 3.9 4.2  CL 104 107 114*  CO2 23 22 23   GLUCOSE 132* 97 89  BUN 24* 24* 23  CREATININE 1.03 0.94 0.95  CALCIUM 9.9 9.0 8.6   Iron on 9/21  42 TIBC on 9/21  193 Ferritin on 9/21 48 Folate on 9/21 >20 Vit B12 on 9/21  1228  C diff by PCR on 9/21 POSITIVE  ASSESSMENT/PLAN:   1. Anemia. Likely due to blood loss form areas of gastritis and duodenitis. Pt should continue bid PPI x 6 weeks, then go to once daily. Will start po iron. If h pylori positive will treat with antibiotic regimen.Follow h/h.  2. c diff. Started on flagyl 9/21. Will add florastor.   LOS: 3 days   Hvozdovic, Deloris Ping 03/22/2014, Pager  [phone]  ________________________________________________________________________  Velora Heckler GI MD note:  I personally examined the patient, reviewed the data and agree with the assessment and plan described above.  Melena was probably from the gastritis, duodenitis.  Due to unopposed ASA?  OK to restart ASA now that she is on twice daily PPI.  IF biopsies are positive for H. Pylori, she will be started on appropriate antibiotics.  Also C. Diff positive and flagyl has been started. She should complete 2 weeks course. Unclear if anemia/melena or c. Diff diarrhea is her prime illness currently.  Hb has drifted to 7.6, will transfuse one unit of PRBC.    Owens Loffler, MD [facility] Pager [phone]

## 2014-03-23 ENCOUNTER — Encounter: Payer: Self-pay | Admitting: Nurse Practitioner

## 2014-03-23 ENCOUNTER — Other Ambulatory Visit: Payer: Self-pay | Admitting: Physician Assistant

## 2014-03-23 DIAGNOSIS — D62 Acute posthemorrhagic anemia: Secondary | ICD-10-CM

## 2014-03-23 DIAGNOSIS — K2990 Gastroduodenitis, unspecified, without bleeding: Secondary | ICD-10-CM

## 2014-03-23 DIAGNOSIS — I10 Essential (primary) hypertension: Secondary | ICD-10-CM

## 2014-03-23 DIAGNOSIS — K2970 Gastritis, unspecified, without bleeding: Secondary | ICD-10-CM

## 2014-03-23 LAB — TYPE AND SCREEN
ABO/RH(D): O POS
Antibody Screen: NEGATIVE
Unit division: 0

## 2014-03-23 LAB — CBC
HCT: 28.1 % — ABNORMAL LOW (ref 36.0–46.0)
HEMOGLOBIN: 9.3 g/dL — AB (ref 12.0–15.0)
MCH: 30.2 pg (ref 26.0–34.0)
MCHC: 33.1 g/dL (ref 30.0–36.0)
MCV: 91.2 fL (ref 78.0–100.0)
Platelets: 150 10*3/uL (ref 150–400)
RBC: 3.08 MIL/uL — ABNORMAL LOW (ref 3.87–5.11)
RDW: 13.9 % (ref 11.5–15.5)
WBC: 4.6 10*3/uL (ref 4.0–10.5)

## 2014-03-23 MED ORDER — SACCHAROMYCES BOULARDII 250 MG PO CAPS: 250.0000 mg | ORAL_CAPSULE | Freq: Two times a day (BID) | ORAL | Status: DC

## 2014-03-23 MED ORDER — PANTOPRAZOLE SODIUM 40 MG PO TBEC: 40.0000 mg | DELAYED_RELEASE_TABLET | Freq: Two times a day (BID) | ORAL | Status: DC

## 2014-03-23 MED ORDER — METRONIDAZOLE 500 MG PO TABS: 500.0000 mg | ORAL_TABLET | Freq: Three times a day (TID) | ORAL | Status: DC

## 2014-03-23 MED ORDER — FERROUS SULFATE 325 (65 FE) MG PO TABS: 325.0000 mg | ORAL_TABLET | Freq: Every day | ORAL | Status: DC

## 2014-03-23 NOTE — Discharge Instructions (Signed)
Go to [facility], in the basement of [place] building, 1 week before follow up appointment for blood work.   Antibiotic Flagyl can cause metallic taste and nausea, take it with food to decrease the side effects.  Avoid drinking Alcohol while on Flagyl.

## 2014-03-23 NOTE — Progress Notes (Signed)
     [place] [facility] Progress Note  Subjective:  Rec 1 init blood yesterday. Hgb this morning 9.3. On protonox. On flagyl for c diff. Had a formed BM today. DFeels well and tol reg diet. Wants to go home.   Objective:  Vital signs in last 24 hours: Temp:  [96.5 F (35.8 C)-98.4 F (36.9 C)] 98 F (36.7 C) (09/23 0534) Pulse Rate:  [60-74] 66 (09/23 0534) Resp:  [16-18] 18 (09/23 0534) BP: (141-175)/(51-70) 166/62 mmHg (09/23 0534) SpO2:  [97 %-100 %] 98 % (09/23 0534) Weight:  [147 lb 14.9 oz (67.1 kg)] 147 lb 14.9 oz (67.1 kg) (09/23 0534) Last BM Date: 03/21/14 General:   Alert,  Well-developed,    in NAD Heart:  Regular rate and rhythm; no murmurs Pulm lungs clear to ausc bilat Abdomen:  Soft, nontender and nondistended. Normal bowel sounds, without guarding, and without rebound.   Extremities:  Without edema. Neurologic:  Alert and  oriented x4;  grossly normal neurologically. Psych:  Alert and cooperative. Normal mood and affect.    Lab Results:  Recent Labs  03/21/14 0655 03/22/14 0500 03/23/14 0446  WBC 3.8* 5.1 4.6  HGB 9.1* 7.6* 9.3*  HCT 27.3* 23.5* 28.1*  PLT 152 154 150   BMET  Recent Labs  03/21/14 0655  NA 146  K 4.2  CL 114*  CO2 23  GLUCOSE 89  BUN 23  CREATININE 0.95  CALCIUM 8.6    ASSESSMENT/PLAN:   1. Anemia. Likely due to blood loss form areas of gastritis and duodenitis. Rec 1 unit bllod yesterday. HgB  9.1 today. Pt should continue bid PPI x 6 weeks, then go to once daily. Will start po iron. If h pylori positive will treat with antibiotic regimen.Follow h/h. Will have pt follow in office in 5 weeks with repeat cbc in 4 weeks. 2. C Diff. Continue flagyl and florastor.    LOS: 4 days   Hvozdovic, Vita Barley PA-C 03/23/2014, Pager [phone]  ________________________________________________________________________  Velora Heckler GI MD note:  I personally examined the patient, reviewed the data and agree with the assessment and  plan described above.   Owens Loffler, MD [facility] Pager [phone]

## 2014-03-23 NOTE — Progress Notes (Signed)
Pt discharged to home per MD order. Pt and daughter received and reviewed all discharge instructions and medication information including follow-up appointments and prescription information. Pt verbalized understanding. Pt alert and oriented at discharge with no complaints of pain. Pt IV and telemetry box removed prior to discharge. Pt escorted to private vehicle via wheelchair by nurse tech. Lenna Sciara

## 2014-03-23 NOTE — Discharge Summary (Signed)
-   Agree with plan data as stated above. - Repeat a CBC in 4 weeks, followup with GI for results of her biopsy as an outpatient. - Continue Flagyl 500 mg by mouth 3 times a day for total 14 days.

## 2014-03-23 NOTE — Discharge Summary (Signed)
Physician Discharge Summary  [name] TOI:[id number] DOB: May 11, 1930 DOA: 03/19/2014  PCP: Loura Pardon, MD  Admit date: 03/19/2014 Discharge date: 03/23/2014  Time spent: 45 minutes  Recommendations for Outpatient Follow-up:  1. Follow up CBC  to reassess hemoglobin. 2. Follow up with [facility] on 04/27/14.  Have blood work done in [facility], one week prior to your appointment on 04/27/14. 3. Discharge to home  Discharge Diagnoses:  Principal Problem:   Near syncope Active Problems:   BRBPR (bright red blood per rectum)   Diarrhea   Melena   GI bleed   Unspecified gastritis and gastroduodenitis without mention of hemorrhage   Discharge Condition: Stable   Diet recommendation: Heart healthy  Filed Weights   03/21/14 0500 03/22/14 0555 03/23/14 0534  Weight: 67.223 kg (148 lb 3.2 oz) 67.3 kg (148 lb 5.9 oz) 67.1 kg (147 lb 14.9 oz)    History of present illness:  Wendy Krueger is a 80 y.o.female, with PMH of HTN, hemorrhoids, and colitis, admitted 03/19/14, who presented to ED with episode of near syncope. She states she developed nausea and diarrhea which was black in color. She had been having BRBPR for several weeks, with blood on toilet tissue, on stool, dripping in to commode, which she attributed to her hemorrhoids. She denies any other changes in bowels, abdominal pain, loss of appetite, or weight loss. Patient is admitted for further workup.   Hospital Course:   Diarrhea  -Presented with bloody diarrhea. Found to be C. Difficile positive, and placed on Flagyl 500 mg PO TID. Patient with no identifiable risk factor of C diff: no recent hospitalization, antibiotic/steroid use, or known exposure to Cdiff   On discharge, stools are formed. -On discharge, placed on flagyl for 11 more days to complete a 14 day course. -Educated on handwashing to reduce risk of spread  Near syncope  -Likely due to dehydration or vasovagal episode secondary  to diarrhea and nausea.  no other episodes.  Workup included cardiac markers, UA, and orthostatic BPs- which were all negative. A 2D echo showed EF 60%, LVH, and grade I diastolic dysfunction; EKG with LBBB, no previous to compare. Patient was monitored on telemetry without any events.  -On discharge, patient is stable without dizziness or weakness.  Melena  -Likely secondary to gastritis/ duodenitis, or possibly unopposed Aspirin. Presented with an episode of dark stools and BRBPR. Gastroenterology followed the patient and recommended EGD, which showed moderate distal gastritis and bulbar duodenitis and is thus placed on Pantoperazole 500 mg BID. During hospitalization, Hgb downtrended to 7.6, and patient was transfused I unit of PRBC.    -On discharge, Hgb improved to 9.3.  -Continue PPIx 6 weeks, then transition to once daily. -[facility] will contact patient if biopsies positive for Hpylori, and will then add antibiotic.  -Resume Aspirin 81 mg PO, since now on PPI BID.  -Follow up with [facility] for Hgb recheck and review of Hpylori biopsies.   Anemia  -During stay, Hgb dropped to  7.6, and patient was transfused one unit of PRBC.  -On discharge, hgb at 9.3.   -On discharge, placed on oral iron supplements daily, and Vitamin B12 supplements.    Hypertension  -BP stable on discharge.  -On discharge, continue lisinopril and metoprolol.   Dementia  -Continue Aricept   Hypokalemia  -Potassium stable on discharge   Procedures:  EGD-There was moderate distal gastritis and bulbar duodenitis, including several erosions at both sites, very friable mucosa. Biopsies were  taken from distal stomach and sent to pathology. There was a 4cm hiatal hernia with associated Cameron's erosions. There was a benign appearing Schatzki's ring that was not dilated. The examination was otherwise normal  2D echo- EF 60%; Wall thickness increased in a pattern of moderate LVH. The estimated ejection fraction  was 60%. Doppler parameters are consistent with abnormal left ventricular relaxation (grade 1 diastolic Dysfunction). Aortic valve sclerosis without stenosis. Mild dilitation of the ascending aorta. Systolic function is normal   Consultations:  Gastroenterology  Discharge Exam: Filed Vitals:   03/23/14 0534  BP: 166/62  Pulse: 66  Temp: 98 F (36.7 C)  Resp: 18     Exam General: Alert and oriented female in NAD.  Sitting comfortably in bed  Eyes: Anicteric account.  Cardiovascular: Regular rate and rhythm.  No murmurs, rubs, or gallops. Respiratory: Clear to auscultate bilaterally.  No rhonchi or crepitations. Abdomen: Soft nontender bowel sounds present. No guarding or rigidity.  Musculoskeletal: No edema.  Psychiatric: Appears normal.  Neurologic: Alert awake oriented to time place and person.    Discharge Instructions      Medication List    STOP taking these medications       loperamide 2 MG capsule  Commonly known as:  IMODIUM      TAKE these medications       aspirin 81 MG chewable tablet  Chew 81 mg by mouth daily.     cholecalciferol 1000 UNITS tablet  Commonly known as:  VITAMIN D  Take 1,000 Units by mouth daily.     docusate sodium 100 MG capsule  Commonly known as:  COLACE  Take 100 mg by mouth 2 (two) times daily.     donepezil 10 MG tablet  Commonly known as:  ARICEPT  Take 10 mg by mouth at bedtime.     ferrous sulfate 325 (65 FE) MG tablet  Take 1 tablet (325 mg total) by mouth daily with breakfast.     lisinopril 10 MG tablet  Commonly known as:  PRINIVIL,ZESTRIL  Take 10 mg by mouth daily.     meclizine 25 MG tablet  Commonly known as:  ANTIVERT  Take 25 mg by mouth 3 (three) times daily as needed for dizziness.     metroNIDAZOLE 500 MG tablet  Commonly known as:  FLAGYL  Take 1 tablet (500 mg total) by mouth 3 (three) times daily.     MULTIVITAMIN PO  Take 1 tablet by mouth daily.     pantoprazole 40 MG tablet  Commonly  known as:  PROTONIX  Take 1 tablet (40 mg total) by mouth 2 (two) times daily before a meal.     potassium chloride 10 MEQ tablet  Commonly known as:  K-DUR  Take 20 mEq by mouth at bedtime.     saccharomyces boulardii 250 MG capsule  Commonly known as:  FLORASTOR  Take 1 capsule (250 mg total) by mouth 2 (two) times daily.      ASK your doctor about these medications       metoprolol 50 MG tablet  Commonly known as:  LOPRESSOR  Take 50 mg by mouth 2 (two) times daily.     vitamin B-12 1000 MCG tablet  Commonly known as:  CYANOCOBALAMIN  Take 1,000 mcg by mouth daily.       Allergies  Allergen Reactions  . Alendronate Sodium     Let pain   . Amlodipine Besylate Hives  . Calcitonin ([place]) Other (See Comments)  headache/ head pressure  . Simvastatin Other (See Comments)    Let pain    Follow-up Information   Follow up with Tye Savoy, NP On 04/27/2014. (9:00--be there 8;45, bring all meds)    Specialty:  Nurse Practitioner   Contact information:   [address] [place] [address] [phone]        The results of significant diagnostics from this hospitalization (including imaging, microbiology, ancillary and laboratory) are listed below for reference.    Significant Diagnostic Studies: No results found.  Microbiology: Recent Results (from the past 240 hour(s))  STOOL CULTURE     Status: None   Collection Time    03/20/14  2:10 PM      Result Value Ref Range Status   Specimen Description STOOL   Final   Special Requests Normal   Final   Culture     Final   Value: NO SUSPICIOUS COLONIES, CONTINUING TO HOLD     Performed at [facility]   Report Status PENDING   Incomplete  CLOSTRIDIUM DIFFICILE BY PCR     Status: Abnormal   Collection Time    03/21/14  3:22 PM      Result Value Ref Range Status   C difficile by pcr POSITIVE (*) NEGATIVE Final   Comment: CRITICAL RESULT CALLED TO, READ BACK BY AND VERIFIED WITH:     ESharyn Lull  RN 17:55 03/21/14 ([email or web address])     Labs: Basic Metabolic Panel:  Recent Labs Lab 03/19/14 1945 03/20/14 0300 03/21/14 0655  NA 141 141 146  K 4.0 3.9 4.2  CL 104 107 114*  CO2 23 22 23   GLUCOSE 132* 97 89  BUN 24* 24* 23  CREATININE 1.03 0.94 0.95  CALCIUM 9.9 9.0 8.6   Liver Function Tests: No results found for this basename: AST, ALT, ALKPHOS, BILITOT, PROT, ALBUMIN,  in the last 168 hours No results found for this basename: LIPASE, AMYLASE,  in the last 168 hours No results found for this basename: AMMONIA,  in the last 168 hours CBC:  Recent Labs Lab 03/19/14 1945 03/20/14 0300 03/21/14 0655 03/22/14 0500 03/23/14 0446  WBC 5.2 4.9 3.8* 5.1 4.6  HGB 11.5* 10.2* 9.1* 7.6* 9.3*  HCT 34.6* 30.4* 27.3* 23.5* 28.1*  MCV 90.1 89.7 91.3 90.7 91.2  PLT 172 174 152 154 150   Cardiac Enzymes:  Recent Labs Lab 03/20/14 1225 03/20/14 1651 03/20/14 2324  TROPONINI <0.30 <0.30 <0.30   BNP: BNP (last 3 results) No results found for this basename: PROBNP,  in the last 8760 hours CBG: No results found for this basename: GLUCAP,  in the last 168 hours     Signed:  Lacy Duverney PA-C  [facility] 03/23/2014, 10:31 AM

## 2014-03-24 ENCOUNTER — Telehealth: Payer: Self-pay | Admitting: Family Medicine

## 2014-03-24 LAB — STOOL CULTURE: SPECIAL REQUESTS: NORMAL

## 2014-03-24 NOTE — Telephone Encounter (Signed)
Received fax from pharmacy saying that protonix was prescribed by a doctor in the hospital and they will not do a PA and would like you to prescribe something else. Pt is coming in for a hospital f/u tomorrow, I will hold fax until tomorrow

## 2014-03-24 NOTE — Telephone Encounter (Signed)
We will disc at her f/u- thanks

## 2014-03-25 ENCOUNTER — Ambulatory Visit (INDEPENDENT_AMBULATORY_CARE_PROVIDER_SITE_OTHER): Payer: Medicare Other | Admitting: Family Medicine

## 2014-03-25 ENCOUNTER — Encounter: Payer: Self-pay | Admitting: Family Medicine

## 2014-03-25 VITALS — BP 146/90 | HR 65 | Temp 98.6°F | Ht 60.0 in | Wt 146.0 lb

## 2014-03-25 DIAGNOSIS — I519 Heart disease, unspecified: Secondary | ICD-10-CM

## 2014-03-25 DIAGNOSIS — I10 Essential (primary) hypertension: Secondary | ICD-10-CM

## 2014-03-25 DIAGNOSIS — D509 Iron deficiency anemia, unspecified: Secondary | ICD-10-CM | POA: Insufficient documentation

## 2014-03-25 DIAGNOSIS — K529 Noninfective gastroenteritis and colitis, unspecified: Secondary | ICD-10-CM

## 2014-03-25 DIAGNOSIS — K2901 Acute gastritis with bleeding: Secondary | ICD-10-CM | POA: Diagnosis not present

## 2014-03-25 DIAGNOSIS — K5289 Other specified noninfective gastroenteritis and colitis: Secondary | ICD-10-CM

## 2014-03-25 DIAGNOSIS — I5189 Other ill-defined heart diseases: Secondary | ICD-10-CM | POA: Insufficient documentation

## 2014-03-25 LAB — CBC WITH DIFFERENTIAL/PLATELET
BASOS PCT: 0.8 % (ref 0.0–3.0)
Basophils Absolute: 0 10*3/uL (ref 0.0–0.1)
EOS PCT: 2.8 % (ref 0.0–5.0)
Eosinophils Absolute: 0.2 10*3/uL (ref 0.0–0.7)
HCT: 30.5 % — ABNORMAL LOW (ref 36.0–46.0)
HEMOGLOBIN: 10.1 g/dL — AB (ref 12.0–15.0)
LYMPHS PCT: 27.2 % (ref 12.0–46.0)
Lymphs Abs: 1.6 10*3/uL (ref 0.7–4.0)
MCHC: 33.1 g/dL (ref 30.0–36.0)
MCV: 91.4 fl (ref 78.0–100.0)
Monocytes Absolute: 0.4 10*3/uL (ref 0.1–1.0)
Monocytes Relative: 7.5 % (ref 3.0–12.0)
NEUTROS PCT: 61.7 % (ref 43.0–77.0)
Neutro Abs: 3.6 10*3/uL (ref 1.4–7.7)
Platelets: 242 10*3/uL (ref 150.0–400.0)
RBC: 3.34 Mil/uL — AB (ref 3.87–5.11)
RDW: 14.5 % (ref 11.5–15.5)
WBC: 5.9 10*3/uL (ref 4.0–10.5)

## 2014-03-25 LAB — BASIC METABOLIC PANEL
BUN: 20 mg/dL (ref 6–23)
CHLORIDE: 108 meq/L (ref 96–112)
CO2: 24 mEq/L (ref 19–32)
Calcium: 9.7 mg/dL (ref 8.4–10.5)
Creatinine, Ser: 1.2 mg/dL (ref 0.4–1.2)
GFR: 46.56 mL/min — ABNORMAL LOW (ref >60.00)
GLUCOSE: 91 mg/dL (ref 70–99)
Potassium: 4.7 mEq/L (ref 3.5–5.1)
Sodium: 139 mEq/L (ref 135–145)

## 2014-03-25 MED ORDER — OMEPRAZOLE 20 MG PO CPDR: 20.0000 mg | DELAYED_RELEASE_CAPSULE | Freq: Two times a day (BID) | ORAL | Status: DC

## 2014-03-25 NOTE — Patient Instructions (Signed)
Labs today  Let's see if omeprazole is more affordable than protonix- get it filled and take it twice daily  If abdominal pain or rectal bleeding/ black stools or diarrhea return please let me know  Follow up with GI as planned next month  Finish your flagyl/ (antibiotic)

## 2014-03-25 NOTE — Progress Notes (Signed)
Pre visit review using our clinic review tool, if applicable. No additional management support is needed unless otherwise documented below in the visit note. 

## 2014-03-25 NOTE — Progress Notes (Signed)
Subjective:    Patient ID: Wendy Krueger, female    DOB: 05/01/1930, 80 y.o.   MRN: [id number]  HPI Here for hosp f/u  Pt was hosp 9/19 to 9/23 for diarrhea /pre syncope/melana  She if feeling all right    Dx with cdiff colitis tx with 14d of flagyl  Dx with gastritis/duodenitis with erosions (likely causing upper gi bleed) Put on bid protonix and inst to hole asa  Blood transfusion for hb of 7.6 Lab Results  Component Value Date   WBC 4.6 03/23/2014   HGB 9.3* 03/23/2014   HCT 28.1* 03/23/2014   MCV 91.2 03/23/2014   PLT 150 03/23/2014   needs this re checked Has GI f/u 10/28 h pylori test is pend  Echo showed diastolic dysfunction A little foot swelling  Not sob   Patient Active Problem List   Diagnosis Date Noted  . Melena 03/21/2014  . GI bleed 03/21/2014  . Unspecified gastritis and gastroduodenitis without mention of hemorrhage 03/21/2014  . Near syncope 03/19/2014  . BRBPR (bright red blood per rectum) 03/19/2014  . Diarrhea 03/19/2014  . Renal insufficiency 01/11/2014  . Dementia 09/01/2013  . Memory loss 09/01/2013  . Anxiety 09/01/2013  . Encounter for Medicare annual wellness exam 01/08/2013  . Colitis 11/11/2011  . Gallstones 11/11/2011  . 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 Left hand weakness 06/04/2011  . Risk for falls 06/04/2011  . Constipation 03/05/2011  . Leg pain, bilateral 01/16/2011  . UNSPECIFIED VITAMIN D DEFICIENCY 03/28/2009  . HYPERCHOLESTEROLEMIA, PURE 03/17/2007  . HYPERTENSION 03/16/2007  . OSTEOARTHRITIS 03/16/2007  . OSTEOPOROSIS 03/16/2007   Past Medical History  Diagnosis Date  . Hypertension   . Osteoarthritis   . Osteoporosis   . Hyperlipidemia   . Colitis - presumed infectious origin     one ER visit   . Asymptomatic gallstones    Past Surgical History  Procedure Laterality Date  . Abdominal hysterectomy      BSO- fibroids  . Wrist fracture surgery  2/01    R arm  . Abi's      normal  . Left  foot brace    . Dexa  1/05    osteoporosis  . Esophagogastroduodenoscopy N/A 03/21/2014    Procedure: ESOPHAGOGASTRODUODENOSCOPY (EGD);  Surgeon: Milus Banister, MD;  Location: [facility];  Service: Endoscopy;  Laterality: N/A;   History  Substance Use Topics  . Smoking status: Never Smoker   . Smokeless tobacco: Never Used  . Alcohol Use: No   Family History  Problem Relation Age of Onset  . Heart attack Father   . Hypertension Father   . Heart attack Mother   . Heart attack Father   . Throat cancer Brother    Allergies  Allergen Reactions  . Alendronate Sodium     Let pain   . Amlodipine Besylate Hives  . Calcitonin ([place]) Other (See Comments)     headache/ head pressure  . Simvastatin Other (See Comments)    Let pain    Current Outpatient Prescriptions on File Prior to Visit  Medication Sig Dispense Refill  . aspirin 81 MG chewable tablet Chew 81 mg by mouth daily.      . cholecalciferol (VITAMIN D) 1000 UNITS tablet Take 1,000 Units by mouth daily.      Marland Kitchen docusate sodium (COLACE) 100 MG capsule Take 100 mg by mouth 2 (two) times daily.       Marland Kitchen donepezil (ARICEPT) 10 MG tablet Take  10 mg by mouth at bedtime.      . ferrous sulfate 325 (65 FE) MG tablet Take 1 tablet (325 mg total) by mouth daily with breakfast.  30 tablet  0  . lisinopril (PRINIVIL,ZESTRIL) 10 MG tablet Take 10 mg by mouth daily.      . meclizine (ANTIVERT) 25 MG tablet Take 25 mg by mouth 3 (three) times daily as needed for dizziness.      . metoprolol (LOPRESSOR) 50 MG tablet Take 50 mg by mouth 2 (two) times daily.      . metroNIDAZOLE (FLAGYL) 500 MG tablet Take 1 tablet (500 mg total) by mouth 3 (three) times daily.  35 tablet  0  . Multiple Vitamins-Minerals (MULTIVITAMIN PO) Take 1 tablet by mouth daily.      . pantoprazole (PROTONIX) 40 MG tablet Take 1 tablet (40 mg total) by mouth 2 (two) times daily before a meal.  84 tablet  0  . potassium chloride (K-DUR) 10 MEQ tablet Take 20 mEq by  mouth at bedtime.      . saccharomyces boulardii (FLORASTOR) 250 MG capsule Take 1 capsule (250 mg total) by mouth 2 (two) times daily.  24 capsule  0  . vitamin B-12 (CYANOCOBALAMIN) 1000 MCG tablet Take 1,000 mcg by mouth daily.       No current facility-administered medications on file prior to visit.    Review of Systems    Review of Systems  Constitutional: Negative for fever, appetite change,  and unexpected weight change. pos for fatigue that is improving  Eyes: Negative for pain and visual disturbance.  Respiratory: Negative for cough and shortness of breath.   Cardiovascular: Negative for cp or palpitations    Gastrointestinal: Negative for nausea, diarrhea and constipation. neg for abdominal pain  Genitourinary: Negative for urgency and frequency.  Skin: Negative for pallor or rash   Neurological: Negative for weakness, light-headedness, numbness and headaches.  Hematological: Negative for adenopathy. Does not bruise/bleed easily.  Psychiatric/Behavioral: Negative for dysphoric mood. The patient is not nervous/anxious.      Objective:   Physical Exam  Constitutional: She appears well-developed and well-nourished. No distress.  Somewhat frail appearing elderly female   HENT:  Head: Normocephalic and atraumatic.  Mouth/Throat: Oropharynx is clear and moist.  Eyes: Conjunctivae and EOM are normal. Pupils are equal, round, and reactive to light. No scleral icterus.  Neck: Normal range of motion. Neck supple.  Cardiovascular: Normal rate, regular rhythm, normal heart sounds and intact distal pulses.  Exam reveals no gallop.   No murmur heard. Pulmonary/Chest: Effort normal and breath sounds normal.  Abdominal: Soft. Bowel sounds are normal. She exhibits no distension and no mass. There is no tenderness.  Musculoskeletal: She exhibits no edema.  Lymphadenopathy:    She has no cervical adenopathy.  Neurological: She is alert. She has normal reflexes. No cranial nerve deficit.  She exhibits normal muscle tone. Coordination normal.  Skin: Skin is warm and dry. No rash noted. No erythema. No pallor.  Psychiatric: She has a normal mood and affect.          Assessment & Plan:   Problem List Items Addressed This Visit     Cardiovascular and Mediastinum   HYPERTENSION      bp in fair control at this time  BP Readings from Last 1 Encounters:  03/25/14 146/90   No changes needed Disc lifstyle change with low sodium diet and exercise  This is improved from hosp where she was hypotensive  Relevant Orders      Basic metabolic panel (Completed)     Digestive   Colitis     Treated for c diff colitis-inst to finish flagyl No further diarrhea or signs of dehydration Lab today    GI bleed - Primary     Upper GI From erosions-? Asa related Changed bid protonix to omeprazole since she could not afford the former  Disc diet Symptom free now -and no melena Cbc today Was transfused one unit - and does feel better Taking iron Rev her hosp records and studies in detail today      Other   Diastolic dysfunction     Found on her echo  Non symptomatic  Will re address after GI w.u Disc symptoms to watch for Reassuring exam today    Anemia, iron deficiency     From upper gi bleed  Cbc today s/p 1 u transfusion and now on iron     Relevant Orders      CBC with Differential (Completed)

## 2014-03-27 NOTE — Assessment & Plan Note (Signed)
Upper GI From erosions-? Asa related Changed bid protonix to omeprazole since she could not afford the former  Disc diet Symptom free now -and no melena Cbc today Was transfused one unit - and does feel better Taking iron Rev her hosp records and studies in detail today

## 2014-03-27 NOTE — Assessment & Plan Note (Signed)
Found on her echo  Non symptomatic  Will re address after GI w.u Disc symptoms to watch for Reassuring exam today

## 2014-03-27 NOTE — Assessment & Plan Note (Signed)
Treated for c diff colitis-inst to finish flagyl No further diarrhea or signs of dehydration Lab today

## 2014-03-27 NOTE — Assessment & Plan Note (Signed)
bp in fair control at this time  BP Readings from Last 1 Encounters:  03/25/14 146/90   No changes needed Disc lifstyle change with low sodium diet and exercise  This is improved from hosp where she was hypotensive

## 2014-03-27 NOTE — Assessment & Plan Note (Signed)
From upper gi bleed  Cbc today s/p 1 u transfusion and now on iron

## 2014-03-28 ENCOUNTER — Encounter: Payer: Self-pay | Admitting: *Deleted

## 2014-04-19 ENCOUNTER — Other Ambulatory Visit (HOSPITAL_COMMUNITY): Payer: Self-pay | Admitting: Family Medicine

## 2014-04-21 ENCOUNTER — Other Ambulatory Visit (INDEPENDENT_AMBULATORY_CARE_PROVIDER_SITE_OTHER): Payer: Medicare Other

## 2014-04-21 DIAGNOSIS — D62 Acute posthemorrhagic anemia: Secondary | ICD-10-CM | POA: Diagnosis not present

## 2014-04-21 LAB — CBC
HEMATOCRIT: 36 % (ref 36.0–46.0)
Hemoglobin: 11.8 g/dL — ABNORMAL LOW (ref 12.0–15.0)
MCHC: 32.7 g/dL (ref 30.0–36.0)
MCV: 92.5 fl (ref 78.0–100.0)
Platelets: 244 10*3/uL (ref 150.0–400.0)
RBC: 3.89 Mil/uL (ref 3.87–5.11)
RDW: 14.9 % (ref 11.5–15.5)
WBC: 4.9 10*3/uL (ref 4.0–10.5)

## 2014-04-27 ENCOUNTER — Encounter: Payer: Self-pay | Admitting: Nurse Practitioner

## 2014-04-27 ENCOUNTER — Ambulatory Visit (INDEPENDENT_AMBULATORY_CARE_PROVIDER_SITE_OTHER): Payer: Medicare Other | Admitting: Nurse Practitioner

## 2014-04-27 VITALS — HR 68 | Ht 60.24 in | Wt 144.0 lb

## 2014-04-27 DIAGNOSIS — K2900 Acute gastritis without bleeding: Secondary | ICD-10-CM

## 2014-04-27 DIAGNOSIS — K2960 Other gastritis without bleeding: Secondary | ICD-10-CM

## 2014-04-27 DIAGNOSIS — K2981 Duodenitis with bleeding: Secondary | ICD-10-CM

## 2014-04-27 DIAGNOSIS — D649 Anemia, unspecified: Secondary | ICD-10-CM

## 2014-04-27 MED ORDER — HYDROCORTISONE ACETATE 25 MG RE SUPP: 25.0000 mg | Freq: Two times a day (BID) | RECTAL | Status: AC

## 2014-04-27 NOTE — Progress Notes (Signed)
     History of Present Illness:   Patient is an 80 year old female here for a hospital follow-up. We met the patient last month during a consultation for melena. EGD revealed erosive gastritis and duodenitis.  Biopsies showed chronic, inactive gastritis. No H. pylori.  Patient did have a significant drop in hemoglobin from baseline of 13 down to 7.6. She was given a unit of blood. Hemoglobin 10.1 by discharge. Recheck hemoglobin last week was 11.8. Patient is on daily iron. No further melena at home  Patient was also diagnosed with C. difficile while in the hospital. She has completed Flagyl. Diarrhea significantly improved. She has 2-3 loose bowel movements a day maybe 2-3 times a week but does take a stool softener to avoid constipation She is still on florastor. Her daughter mentions that hemorrhoids making perianal area difficult to keep clean   Current Medications, Allergies, Past Medical History, Past Surgical History, Family History and Social History were reviewed in [employer] record.  ENDOSCOPIC IMPRESSION: There was moderate distal gastritis and bulbar duodenitis, including several erosions at both sites, very friable mucosa. Biopsies were taken from distal stomach and sent to pathology. There was a 4cm hiatal hernia with associated Cameron's erosions. There was a benign appearing Schatzki's ring that was not dilated. The examination was otherwise normal RECOMMENDATIONS: The findings in UGI tract likely explain her GI bleeding. Perhaps from "unopposed" daily aspirin? She will start twice daily PPI and continue for at least 4-6 weeks. If biopsies show H. pylori then she will be started on appropriate antibiotics. No need for colonoscopy at this point. eSigned: Milus Banister, MD 03/21/2014 11:18 AM   Physical Exam: General: Pleasant, well developed , black female in no acute distress Head: Normocephalic and atraumatic Eyes:  sclerae anicteric, conjunctiva  pink  Ears: Normal auditory acuity Lungs: Clear throughout to auscultation Heart: Regular rate and rhythm Abdomen: Soft, non distended, non-tender. No masses, no hepatomegaly. Normal bowel sounds Rectal: Inflamed external hemorrhoids, possibly protruding internal hemorrhoid. Musculoskeletal: Symmetrical with no gross deformities  Extremities: No edema  Neurological: Alert oriented x 4, grossly nonfocal Psychological:  Alert and cooperative. Normal mood and affect  Assessment and Recommendations:  80. 35 -year-old female recently hospitalized with melena secondary to severe erosive gastritis /duodenitis.  Biopsies were negative for H. Pylori. No further melena. She has been on twice daily PPI for over a month now. Will decrease PPI to once daily. Hemoglobin is normalizing. Of note patient has never had a colonoscopy but given age there are no plans to proceed with one at this point.  2. Anemia of acute blood loss. Hospital nadir hemoglobin 7.6, down from her baseline of 13. After unit of blood and a month of oral iron her hemoglobin is up to 11.8. Advised to continue oral iron for 1 more month  3. Hemorrhoids. Will treat with twice daily steroid suppositories for 7 days.

## 2014-04-27 NOTE — Patient Instructions (Addendum)
We sent a prescription to [facility] for Anusol HC Suppositories.  Use twice daily for 7 days. Continue iron for 30 more days.  Decrease Prilosec to once daily (30 minutes before breakfast). Stop the Florastor.  Call us right away if the diarrhea does not get better or worsen.

## 2014-04-28 ENCOUNTER — Encounter: Payer: Self-pay | Admitting: Nurse Practitioner

## 2014-04-28 DIAGNOSIS — D649 Anemia, unspecified: Secondary | ICD-10-CM | POA: Insufficient documentation

## 2014-04-28 DIAGNOSIS — K2960 Other gastritis without bleeding: Secondary | ICD-10-CM | POA: Insufficient documentation

## 2014-04-28 DIAGNOSIS — K2981 Duodenitis with bleeding: Secondary | ICD-10-CM | POA: Insufficient documentation

## 2014-05-01 NOTE — Progress Notes (Signed)
I agree with the note, plan above 

## 2014-05-23 ENCOUNTER — Other Ambulatory Visit: Payer: Self-pay | Admitting: *Deleted

## 2014-05-23 MED ORDER — DONEPEZIL HCL 10 MG PO TABS: 10.0000 mg | ORAL_TABLET | Freq: Every day | ORAL | Status: DC

## 2014-05-23 MED ORDER — OMEPRAZOLE 20 MG PO CPDR: 20.0000 mg | DELAYED_RELEASE_CAPSULE | Freq: Two times a day (BID) | ORAL | Status: DC

## 2014-05-23 MED ORDER — METOPROLOL TARTRATE 50 MG PO TABS: 50.0000 mg | ORAL_TABLET | Freq: Two times a day (BID) | ORAL | Status: DC

## 2014-05-23 MED ORDER — LISINOPRIL 10 MG PO TABS: 10.0000 mg | ORAL_TABLET | Freq: Every day | ORAL | Status: DC

## 2014-05-23 MED ORDER — POTASSIUM CHLORIDE ER 10 MEQ PO TBCR: 20.0000 meq | EXTENDED_RELEASE_TABLET | Freq: Every day | ORAL | Status: DC

## 2014-06-20 ENCOUNTER — Ambulatory Visit (INDEPENDENT_AMBULATORY_CARE_PROVIDER_SITE_OTHER): Payer: Medicare Other | Admitting: Family Medicine

## 2014-06-20 ENCOUNTER — Encounter: Payer: Self-pay | Admitting: Family Medicine

## 2014-06-20 VITALS — BP 146/84 | HR 57 | Temp 97.7°F | Ht 60.25 in | Wt 145.2 lb

## 2014-06-20 DIAGNOSIS — I10 Essential (primary) hypertension: Secondary | ICD-10-CM

## 2014-06-20 DIAGNOSIS — N289 Disorder of kidney and ureter, unspecified: Secondary | ICD-10-CM | POA: Diagnosis not present

## 2014-06-20 DIAGNOSIS — F0390 Unspecified dementia without behavioral disturbance: Secondary | ICD-10-CM

## 2014-06-20 DIAGNOSIS — J309 Allergic rhinitis, unspecified: Secondary | ICD-10-CM | POA: Insufficient documentation

## 2014-06-20 DIAGNOSIS — L509 Urticaria, unspecified: Secondary | ICD-10-CM | POA: Diagnosis not present

## 2014-06-20 DIAGNOSIS — D509 Iron deficiency anemia, unspecified: Secondary | ICD-10-CM

## 2014-06-20 LAB — COMPREHENSIVE METABOLIC PANEL
ALT: 14 U/L (ref 0–35)
AST: 18 U/L (ref 0–37)
Albumin: 4.1 g/dL (ref 3.5–5.2)
Alkaline Phosphatase: 50 U/L (ref 39–117)
BILIRUBIN TOTAL: 0.4 mg/dL (ref 0.2–1.2)
BUN: 17 mg/dL (ref 6–23)
CHLORIDE: 103 meq/L (ref 96–112)
CO2: 30 mEq/L (ref 19–32)
Calcium: 9.4 mg/dL (ref 8.4–10.5)
Creatinine, Ser: 1 mg/dL (ref 0.4–1.2)
GFR: 55.68 mL/min — ABNORMAL LOW (ref >60.00)
Glucose, Bld: 81 mg/dL (ref 70–99)
Potassium: 4 mEq/L (ref 3.5–5.1)
SODIUM: 140 meq/L (ref 135–145)
TOTAL PROTEIN: 6.9 g/dL (ref 6.0–8.3)

## 2014-06-20 LAB — CBC WITH DIFFERENTIAL/PLATELET
BASOS ABS: 0 10*3/uL (ref 0.0–0.1)
Basophils Relative: 0.6 % (ref 0.0–3.0)
Eosinophils Absolute: 0.4 10*3/uL (ref 0.0–0.7)
Eosinophils Relative: 6.4 % — ABNORMAL HIGH (ref 0.0–5.0)
HCT: 41.6 % (ref 36.0–46.0)
Hemoglobin: 13.6 g/dL (ref 12.0–15.0)
LYMPHS ABS: 1.8 10*3/uL (ref 0.7–4.0)
LYMPHS PCT: 31.3 % (ref 12.0–46.0)
MCHC: 32.7 g/dL (ref 30.0–36.0)
MCV: 90.7 fl (ref 78.0–100.0)
Monocytes Absolute: 0.4 10*3/uL (ref 0.1–1.0)
Monocytes Relative: 7.7 % (ref 3.0–12.0)
NEUTROS PCT: 54 % (ref 43.0–77.0)
Neutro Abs: 3.1 10*3/uL (ref 1.4–7.7)
PLATELETS: 207 10*3/uL (ref 150.0–400.0)
RBC: 4.58 Mil/uL (ref 3.87–5.11)
RDW: 12.5 % (ref 11.5–15.5)
WBC: 5.8 10*3/uL (ref 4.0–10.5)

## 2014-06-20 NOTE — Assessment & Plan Note (Signed)
bp in fair control at this time  BP Readings from Last 1 Encounters:  06/20/14 146/84   No changes needed Disc lifstyle change with low sodium diet and exercise   Lab today

## 2014-06-20 NOTE — Assessment & Plan Note (Signed)
Check cbc today - s/p GI bleed on iron  If back to baseline on iron will be able to stop it  No GI symptoms

## 2014-06-20 NOTE — Assessment & Plan Note (Signed)
Lab today  GI bleed is resolved and hydration status is better  Enc good water intake

## 2014-06-20 NOTE — Assessment & Plan Note (Signed)
Overall stable Tolerating aricept Working to make her house safer   Will continue to monitor

## 2014-06-20 NOTE — Assessment & Plan Note (Signed)
One whelp seen on finger today  Per pt and family on and off  Disc poss causes- allergens or anxiety /change of environment No angioedema   Recommend zyrtec 10 mg daily as tolerated Update if not starting to improve in a week or if worsening

## 2014-06-20 NOTE — Patient Instructions (Signed)
Runny nose and hives may be from environmental change (? Pets or Christmas tree)  Also anxiety or stress  Try zyrtec 10 mg (store brand is fine )- at bedtime Drink more water for kidney and skin health and dry mouth  Labs today for blood count and kidney function    Let me know if no improvement

## 2014-06-20 NOTE — Progress Notes (Signed)
Pre visit review using our clinic review tool, if applicable. No additional management support is needed unless otherwise documented below in the visit note. 

## 2014-06-20 NOTE — Assessment & Plan Note (Signed)
May be worse lately since exp to dog and xmas tree in the home  Suggest zyrtec 10 mg daily (for this and also itching/ hives) Update if not starting to improve in a week or if worsening

## 2014-06-20 NOTE — Progress Notes (Signed)
Subjective:    Patient ID: Wendy Krueger, female    DOB: 06-21-1930, 80 y.o.   MRN: [id number]  HPI Here for f/u of chronic health problems Is doing allright  Feeling ok  Her nose has been running - given benadryl (? If it helped)    (xmas tree)  Breath has been worse lately also  Was on florastar- off of that now   Has not needed meclizine lately   She gets rash - ? [place]helps , wonders about hives  Skin is generally dry  Did buy her some eucerin lotion   In the process of moving and they have to tear house down   bp is stable today  No cp or palpitations or headaches or edema  No side effects to medicines  BP Readings from Last 3 Encounters:  06/20/14 146/84  03/25/14 146/90  03/23/14 157/62     Pt was tx for GI bleed (upper) since last visit  Also transfused  Back on daily ppi  Lab Results  Component Value Date   WBC 4.9 04/21/2014   HGB 11.8* 04/21/2014   HCT 36.0 04/21/2014   MCV 92.5 04/21/2014   PLT 244.0 04/21/2014   still taking iron    Renal insuff   Chemistry      Component Value Date/Time   NA 139 03/25/2014 1232   K 4.7 03/25/2014 1232   CL 108 03/25/2014 1232   CO2 24 03/25/2014 1232   BUN 20 03/25/2014 1232   CREATININE 1.2 03/25/2014 1232      Component Value Date/Time   CALCIUM 9.7 03/25/2014 1232   ALKPHOS 50 01/03/2014 0949   AST 21 01/03/2014 0949   ALT 11 01/03/2014 0949   BILITOT 0.6 01/03/2014 0949     Water intake - her family keeps after her to keep drinking water  She does not to drink a lot at night time   Dementia- family thinks is fairly stable  Forgets names a lot  No worse  No agitation or behavioral issues Some anxiety generally   Patient Active Problem List   Diagnosis Date Noted  . Urticaria 06/20/2014  . Allergic rhinitis 06/20/2014  . Erosive gastritis 04/28/2014  . Duodenitis with bleeding 04/28/2014  . Normocytic anemia 04/28/2014  . Diastolic dysfunction 27/78/2423  . Anemia, iron deficiency  03/25/2014  . Melena 03/21/2014  . GI bleed 03/21/2014  . Unspecified gastritis and gastroduodenitis without mention of hemorrhage 03/21/2014  . Near syncope 03/19/2014  . BRBPR (bright red blood per rectum) 03/19/2014  . Diarrhea 03/19/2014  . Renal insufficiency 01/11/2014  . Dementia 09/01/2013  . Memory loss 09/01/2013  . Anxiety 09/01/2013  . Encounter for Medicare annual wellness exam 01/08/2013  . Colitis 11/11/2011  . Gallstones 11/11/2011  . 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 Left hand weakness 06/04/2011  . Risk for falls 06/04/2011  . Constipation 03/05/2011  . Leg pain, bilateral 01/16/2011  . UNSPECIFIED VITAMIN D DEFICIENCY 03/28/2009  . HYPERCHOLESTEROLEMIA, PURE 03/17/2007  . Essential hypertension 03/16/2007  . OSTEOARTHRITIS 03/16/2007  . OSTEOPOROSIS 03/16/2007   Past Medical History  Diagnosis Date  . Hypertension   . Osteoarthritis   . Osteoporosis   . Hyperlipidemia   . Colitis - presumed infectious origin     one ER visit   . Asymptomatic gallstones    Past Surgical History  Procedure Laterality Date  . Abdominal hysterectomy      BSO- fibroids  . Wrist fracture surgery  2/01  R arm  . Abi's      normal  . Left foot brace    . Dexa  1/05    osteoporosis  . Esophagogastroduodenoscopy N/A 03/21/2014    Procedure: ESOPHAGOGASTRODUODENOSCOPY (EGD);  Surgeon: Milus Banister, MD;  Location: [place];  Service: Endoscopy;  Laterality: N/A;   History  Substance Use Topics  . Smoking status: Never Smoker   . Smokeless tobacco: Never Used  . Alcohol Use: No   Family History  Problem Relation Age of Onset  . Heart attack Father   . Hypertension Father   . Heart attack Mother   . Heart attack Father   . Throat cancer Brother    Allergies  Allergen Reactions  . Alendronate Sodium     Leg pain   . Amlodipine Besylate Hives  . Calcitonin ([place]) Other (See Comments)     headache/ head pressure  . Simvastatin Other (See  Comments)    Leg pain    Current Outpatient Prescriptions on File Prior to Visit  Medication Sig Dispense Refill  . aspirin 81 MG chewable tablet Chew 81 mg by mouth daily.    . cholecalciferol (VITAMIN D) 1000 UNITS tablet Take 1,000 Units by mouth daily.    Marland Kitchen docusate sodium (COLACE) 100 MG capsule Take 100 mg by mouth daily as needed.     . donepezil (ARICEPT) 10 MG tablet Take 1 tablet (10 mg total) by mouth at bedtime. 90 tablet 1  . ferrous sulfate 325 (65 FE) MG tablet TAKE 1 TABLET (325 MG TOTAL) BY MOUTH DAILY WITH BREAKFAST. 30 tablet 5  . lisinopril (PRINIVIL,ZESTRIL) 10 MG tablet Take 1 tablet (10 mg total) by mouth daily. 90 tablet 1  . meclizine (ANTIVERT) 25 MG tablet Take 25 mg by mouth 3 (three) times daily as needed for dizziness.    . metoprolol (LOPRESSOR) 50 MG tablet Take 1 tablet (50 mg total) by mouth 2 (two) times daily. 180 tablet 1  . Multiple Vitamins-Minerals (MULTIVITAMIN PO) Take 1 tablet by mouth daily.    Marland Kitchen omeprazole (PRILOSEC) 20 MG capsule Take 1 capsule (20 mg total) by mouth 2 (two) times daily. (Patient taking differently: Take 20 mg by mouth daily. ) 180 capsule 1  . potassium chloride (K-DUR) 10 MEQ tablet Take 2 tablets (20 mEq total) by mouth at bedtime. 180 tablet 1  . vitamin B-12 (CYANOCOBALAMIN) 1000 MCG tablet Take 1,000 mcg by mouth daily.     No current facility-administered medications on file prior to visit.    Review of Systems Review of Systems  Constitutional: Negative for fever, appetite change, fatigue and unexpected weight change.  ENT pos for rhinorrhea/sneezing , neg for sinus pain or st  Eyes: Negative for pain and visual disturbance.  Respiratory: Negative for cough and shortness of breath.   Cardiovascular: Negative for cp or palpitations    Gastrointestinal: Negative for nausea, diarrhea and constipation.  Genitourinary: Negative for urgency and frequency.  Skin: Negative for pallor or pos for itching and rash Neurological:  Negative for weakness, light-headedness, numbness and headaches.  Hematological: Negative for adenopathy. Does not bruise/bleed easily.  Psychiatric/Behavioral: Negative for dysphoric mood. The patient is anxious , pos for dementia        Objective:   Physical Exam  Constitutional: She appears well-developed and well-nourished. No distress.  Well appearing elderly female  HENT:  Head: Normocephalic and atraumatic.  Right Ear: External ear normal.  Left Ear: External ear normal.  Mouth/Throat: Oropharynx is clear  and moist. No oropharyngeal exudate.  Boggy nares Some clear rhinorrhea w/o congestion  No sinus tenderness   Eyes: Conjunctivae and EOM are normal. Pupils are equal, round, and reactive to light. Right eye exhibits no discharge. Left eye exhibits no discharge.  Neck: Normal range of motion. Neck supple.  Cardiovascular: Normal rate, regular rhythm and normal heart sounds.  Exam reveals no gallop.   Pulmonary/Chest: Effort normal and breath sounds normal. No respiratory distress. She has no wheezes. She has no rales.  Abdominal: Soft. Bowel sounds are normal. She exhibits no distension and no mass. There is no tenderness. There is no rebound and no guarding.  Musculoskeletal: She exhibits no edema.  Lymphadenopathy:    She has no cervical adenopathy.  Neurological: She is alert. She has normal reflexes. No cranial nerve deficit. She exhibits normal muscle tone. Coordination normal.  Skin: Skin is warm and dry. Rash noted. No erythema. No pallor.  Some excoriations on arms  Small whelp on L index finger   No signs of angioedema   Psychiatric: She has a normal mood and affect. Cognition and memory are impaired. She exhibits abnormal recent memory.  Pleasant and talkative           Assessment & Plan:   Problem List Items Addressed This Visit      Cardiovascular and Mediastinum   Essential hypertension - Primary    bp in fair control at this time  BP Readings from  Last 1 Encounters:  06/20/14 146/84   No changes needed Disc lifstyle change with low sodium diet and exercise   Lab today      Respiratory   Allergic rhinitis    May be worse lately since exp to dog and xmas tree in the home  Suggest zyrtec 10 mg daily (for this and also itching/ hives) Update if not starting to improve in a week or if worsening        Nervous and Auditory   Dementia    Overall stable Tolerating aricept Working to make her house safer   Will continue to monitor        Musculoskeletal and Integument   Urticaria    One whelp seen on finger today  Per pt and family on and off  Disc poss causes- allergens or anxiety /change of environment No angioedema   Recommend zyrtec 10 mg daily as tolerated Update if not starting to improve in a week or if worsening        Genitourinary   Renal insufficiency    Lab today  GI bleed is resolved and hydration status is better  Enc good water intake     Relevant Orders      Comprehensive metabolic panel (Completed)     Other   Anemia, iron deficiency    Check cbc today - s/p GI bleed on iron  If back to baseline on iron will be able to stop it  No GI symptoms      Relevant Orders      CBC with Differential (Completed)

## 2014-06-21 ENCOUNTER — Telehealth: Payer: Self-pay

## 2014-06-21 NOTE — Telephone Encounter (Signed)
Done and in IN box 

## 2014-06-21 NOTE — Telephone Encounter (Signed)
Wendy Krueger pts daughter left v/m; pt was seen 06/20/14 and forgot to ask for handicap placard renewal. Wendy Krueger request cb [phone] when ready for pick up. DPR signed.

## 2014-07-12 ENCOUNTER — Ambulatory Visit (INDEPENDENT_AMBULATORY_CARE_PROVIDER_SITE_OTHER): Payer: Medicare Other | Admitting: *Deleted

## 2014-07-12 ENCOUNTER — Ambulatory Visit: Payer: Medicare Other

## 2014-07-12 DIAGNOSIS — Z23 Encounter for immunization: Secondary | ICD-10-CM

## 2014-08-04 ENCOUNTER — Other Ambulatory Visit: Payer: Self-pay | Admitting: Family Medicine

## 2014-08-07 DIAGNOSIS — H2513 Age-related nuclear cataract, bilateral: Secondary | ICD-10-CM | POA: Diagnosis not present

## 2014-09-05 ENCOUNTER — Ambulatory Visit (INDEPENDENT_AMBULATORY_CARE_PROVIDER_SITE_OTHER)
Admission: RE | Admit: 2014-09-05 | Discharge: 2014-09-05 | Disposition: A | Discharge status: Home / Self Care | Payer: Medicare Other | Source: Ambulatory Visit | Attending: Family Medicine | Admitting: Family Medicine

## 2014-09-05 ENCOUNTER — Ambulatory Visit (INDEPENDENT_AMBULATORY_CARE_PROVIDER_SITE_OTHER): Payer: Medicare Other | Admitting: Family Medicine

## 2014-09-05 ENCOUNTER — Encounter: Payer: Self-pay | Admitting: Family Medicine

## 2014-09-05 VITALS — BP 148/88 | HR 63 | Temp 97.9°F | Wt 147.8 lb

## 2014-09-05 DIAGNOSIS — S52601A Unspecified fracture of lower end of right ulna, initial encounter for closed fracture: Secondary | ICD-10-CM | POA: Diagnosis not present

## 2014-09-05 DIAGNOSIS — S52501A Unspecified fracture of the lower end of right radius, initial encounter for closed fracture: Secondary | ICD-10-CM | POA: Diagnosis not present

## 2014-09-05 DIAGNOSIS — M79641 Pain in right hand: Secondary | ICD-10-CM | POA: Diagnosis not present

## 2014-09-05 DIAGNOSIS — M25531 Pain in right wrist: Secondary | ICD-10-CM | POA: Insufficient documentation

## 2014-09-05 DIAGNOSIS — Z9181 History of falling: Secondary | ICD-10-CM | POA: Diagnosis not present

## 2014-09-05 NOTE — Assessment & Plan Note (Signed)
Recent fall  Pt has refused to use her walker  Reiterated that if she does not do so - she will continue to fall and injure herself- requiring family to care for her  She voices understanding

## 2014-09-05 NOTE — Assessment & Plan Note (Signed)
2 wk after fall on outstretched hand Wrist also involved   Failed ice and wrist brace Xray today

## 2014-09-05 NOTE — Progress Notes (Signed)
Subjective:    Patient ID: Wendy Krueger, female    DOB: 11-Apr-1930, 81 y.o.   MRN: [id number]  HPI Here for arm pain after a fall   Was at son's house  Got her "feet tangled" walking between the couch and a chair  2 weeks ago Of note-she still refuses to use her walker   [address] [address] forward - and tried to catch herself with her R arm  Did not hurt that bad at first  Hurt to move her fingers and her wrist  Swelled up that night - and then it came down  Some of it was bruised   Hurts most in dorsal hand -from wrist to knuckles   Used ice/cold compress Did also wrap it up (had a wrist brace and it made it worse)  Not too helpful   Patient Active Problem List   Diagnosis Date Noted  . Urticaria 06/20/2014  . Allergic rhinitis 06/20/2014  . Erosive gastritis 04/28/2014  . Duodenitis with bleeding 04/28/2014  . Normocytic anemia 04/28/2014  . Diastolic dysfunction 76/54/6503  . Anemia, iron deficiency 03/25/2014  . Melena 03/21/2014  . GI bleed 03/21/2014  . Unspecified gastritis and gastroduodenitis without mention of hemorrhage 03/21/2014  . Near syncope 03/19/2014  . BRBPR (bright red blood per rectum) 03/19/2014  . Diarrhea 03/19/2014  . Renal insufficiency 01/11/2014  . Dementia 09/01/2013  . Memory loss 09/01/2013  . Anxiety 09/01/2013  . Encounter for Medicare annual wellness exam 01/08/2013  . Colitis 11/11/2011  . Gallstones 11/11/2011  . 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 Left hand weakness 06/04/2011  . Risk for falls 06/04/2011  . Constipation 03/05/2011  . Leg pain, bilateral 01/16/2011  . UNSPECIFIED VITAMIN D DEFICIENCY 03/28/2009  . HYPERCHOLESTEROLEMIA, PURE 03/17/2007  . Essential hypertension 03/16/2007  . OSTEOARTHRITIS 03/16/2007  . OSTEOPOROSIS 03/16/2007   Past Medical History  Diagnosis Date  . Hypertension   . Osteoarthritis   . Osteoporosis   . Hyperlipidemia   . Colitis - presumed infectious origin     one ER visit   .  Asymptomatic gallstones    Past Surgical History  Procedure Laterality Date  . Abdominal hysterectomy      BSO- fibroids  . Wrist fracture surgery  2/01    R arm  . Abi's      normal  . Left foot brace    . Dexa  1/05    osteoporosis  . Esophagogastroduodenoscopy N/A 03/21/2014    Procedure: ESOPHAGOGASTRODUODENOSCOPY (EGD);  Surgeon: Milus Banister, MD;  Location: [facility];  Service: Endoscopy;  Laterality: N/A;   History  Substance Use Topics  . Smoking status: Never Smoker   . Smokeless tobacco: Never Used  . Alcohol Use: No   Family History  Problem Relation Age of Onset  . Heart attack Father   . Hypertension Father   . Heart attack Mother   . Heart attack Father   . Throat cancer Brother    Allergies  Allergen Reactions  . Alendronate Sodium     Leg pain   . Amlodipine Besylate Hives  . Calcitonin ([place]) Other (See Comments)     headache/ head pressure  . Simvastatin Other (See Comments)    Leg pain    Current Outpatient Prescriptions on File Prior to Visit  Medication Sig Dispense Refill  . aspirin 81 MG chewable tablet Chew 81 mg by mouth daily.    . cholecalciferol (VITAMIN D) 1000 UNITS tablet Take 1,000 Units by  mouth daily.    Marland Kitchen docusate sodium (COLACE) 100 MG capsule Take 100 mg by mouth daily as needed.     . donepezil (ARICEPT) 10 MG tablet Take 1 tablet (10 mg total) by mouth at bedtime. 90 tablet 1  . ferrous sulfate 325 (65 FE) MG tablet TAKE 1 TABLET (325 MG TOTAL) BY MOUTH DAILY WITH BREAKFAST. 30 tablet 5  . KLOR-CON M10 10 MEQ tablet TAKE 2 TABLETS BY MOUTH DAILY 60 tablet 1  . lisinopril (PRINIVIL,ZESTRIL) 10 MG tablet Take 1 tablet (10 mg total) by mouth daily. 90 tablet 1  . meclizine (ANTIVERT) 25 MG tablet Take 25 mg by mouth 3 (three) times daily as needed for dizziness.    . metoprolol (LOPRESSOR) 50 MG tablet Take 1 tablet (50 mg total) by mouth 2 (two) times daily. 180 tablet 1  . Multiple Vitamins-Minerals (MULTIVITAMIN PO) Take  1 tablet by mouth daily.    Marland Kitchen omeprazole (PRILOSEC) 20 MG capsule Take 1 capsule (20 mg total) by mouth 2 (two) times daily. (Patient taking differently: Take 20 mg by mouth daily. ) 180 capsule 1  . vitamin B-12 (CYANOCOBALAMIN) 1000 MCG tablet Take 1,000 mcg by mouth daily.     No current facility-administered medications on file prior to visit.       Review of Systems Review of Systems  Constitutional: Negative for fever, appetite change, fatigue and unexpected weight change.  Eyes: Negative for pain and visual disturbance.  Respiratory: Negative for cough and shortness of breath.   Cardiovascular: Negative for cp or palpitations    Gastrointestinal: Negative for nausea, diarrhea and constipation.  Genitourinary: Negative for urgency and frequency.  Skin: Negative for pallor or rash   MSK pos for R hand and wrist pain with limited rom  Neurological: Negative for weakness, light-headedness, numbness and headaches.  Hematological: Negative for adenopathy. Does not bruise/bleed easily.  Psychiatric/Behavioral: Negative for dysphoric mood. The patient is not nervous/anxious.         Objective:   Physical Exam  Constitutional: She appears well-developed and well-nourished. No distress.  Eyes: Conjunctivae and EOM are normal. Pupils are equal, round, and reactive to light.  Neck: Normal range of motion. Neck supple.  Cardiovascular: Normal rate, regular rhythm and intact distal pulses.   Musculoskeletal: She exhibits edema and tenderness.       Right wrist: She exhibits decreased range of motion, tenderness, bony tenderness and swelling. She exhibits no effusion, no crepitus and no deformity.       Right hand: She exhibits decreased range of motion, tenderness and bony tenderness. She exhibits normal two-point discrimination, normal capillary refill, no deformity, no laceration and no swelling. Normal sensation noted. Normal strength noted.  Tender over dorsal R wrist and hand    Diffusely- no particular area Snuffbox area is included Mild edema of wrist-no bruising   No crepitus Making a fist causes pain  No neurol changes Well perfused   Neurological: She is alert. She has normal reflexes. No cranial nerve deficit. She exhibits normal muscle tone. Coordination normal.  Skin: Skin is warm and dry. No rash noted. No erythema. No pallor.  Psychiatric: She has a normal mood and affect.          Assessment & Plan:   Problem List Items Addressed This Visit      Other   Right hand pain - Primary    2 wk after fall on outstretched hand Wrist also involved   Failed ice and wrist brace  Xray today      Relevant Orders   DG Hand Complete Right   DG Wrist Complete Right   Right wrist pain    2 weeks after a fall  Some swelling that is improved Limited rom  Has failed wrist brace and ice Xray today - fracture is possible   Long disc re: falls and safety, recommend walker use at all times       Relevant Orders   DG Hand Complete Right   DG Wrist Complete Right   Risk for falls    Recent fall  Pt has refused to use her walker  Reiterated that if she does not do so - she will continue to fall and injure herself- requiring family to care for her  She voices understanding

## 2014-09-05 NOTE — Assessment & Plan Note (Signed)
2 weeks after a fall  Some swelling that is improved Limited rom  Has failed wrist brace and ice Xray today - fracture is possible   Long disc re: falls and safety, recommend walker use at all times

## 2014-09-05 NOTE — Patient Instructions (Signed)
Xray of hand and wrist today  Use ice - 10 minutes at a time

## 2014-09-05 NOTE — Progress Notes (Signed)
Pre visit review using our clinic review tool, if applicable. No additional management support is needed unless otherwise documented below in the visit note. 

## 2014-09-06 ENCOUNTER — Telehealth: Payer: Self-pay | Admitting: Family Medicine

## 2014-09-06 DIAGNOSIS — M25631 Stiffness of right wrist, not elsewhere classified: Secondary | ICD-10-CM | POA: Diagnosis not present

## 2014-09-06 DIAGNOSIS — M25531 Pain in right wrist: Secondary | ICD-10-CM | POA: Diagnosis not present

## 2014-09-06 DIAGNOSIS — S52531A Colles' fracture of right radius, initial encounter for closed fracture: Secondary | ICD-10-CM

## 2014-09-06 DIAGNOSIS — Z8781 Personal history of (healed) traumatic fracture: Secondary | ICD-10-CM | POA: Insufficient documentation

## 2014-09-06 NOTE — Telephone Encounter (Signed)
Please let pt /family know she has a R radial fracture and poss torn ligament I am ref her to ortho- she will be getting a call

## 2014-09-06 NOTE — Telephone Encounter (Signed)
Patient daughter informed of r radial fracture and referral.

## 2014-09-20 DIAGNOSIS — S52531D Colles' fracture of right radius, subsequent encounter for closed fracture with routine healing: Secondary | ICD-10-CM | POA: Diagnosis not present

## 2014-09-20 DIAGNOSIS — M25531 Pain in right wrist: Secondary | ICD-10-CM | POA: Diagnosis not present

## 2014-09-20 DIAGNOSIS — M25631 Stiffness of right wrist, not elsewhere classified: Secondary | ICD-10-CM | POA: Diagnosis not present

## 2014-09-22 ENCOUNTER — Other Ambulatory Visit: Payer: Self-pay

## 2014-09-22 DIAGNOSIS — Z1231 Encounter for screening mammogram for malignant neoplasm of breast: Secondary | ICD-10-CM

## 2014-09-28 DIAGNOSIS — S62101D Fracture of unspecified carpal bone, right wrist, subsequent encounter for fracture with routine healing: Secondary | ICD-10-CM | POA: Diagnosis not present

## 2014-10-03 ENCOUNTER — Other Ambulatory Visit: Payer: Self-pay | Admitting: Family Medicine

## 2014-10-14 DIAGNOSIS — M25631 Stiffness of right wrist, not elsewhere classified: Secondary | ICD-10-CM | POA: Diagnosis not present

## 2014-10-14 DIAGNOSIS — S62101D Fracture of unspecified carpal bone, right wrist, subsequent encounter for fracture with routine healing: Secondary | ICD-10-CM | POA: Diagnosis not present

## 2014-10-14 DIAGNOSIS — S52531D Colles' fracture of right radius, subsequent encounter for closed fracture with routine healing: Secondary | ICD-10-CM | POA: Diagnosis not present

## 2014-10-17 ENCOUNTER — Other Ambulatory Visit: Payer: Self-pay | Admitting: Family Medicine

## 2014-10-18 MED ORDER — DONEPEZIL HCL 10 MG PO TABS: 10.0000 mg | ORAL_TABLET | Freq: Every day | ORAL | Status: DC

## 2014-10-18 NOTE — Addendum Note (Signed)
Addended by: Elba Lions on: 10/18/2014 09:45 AM   Modules accepted: Orders

## 2014-11-04 ENCOUNTER — Ambulatory Visit
Admission: RE | Admit: 2014-11-04 | Discharge: 2014-11-04 | Disposition: A | Discharge status: Home / Self Care | Payer: Medicare Other | Source: Ambulatory Visit

## 2014-11-04 DIAGNOSIS — Z1231 Encounter for screening mammogram for malignant neoplasm of breast: Secondary | ICD-10-CM | POA: Diagnosis not present

## 2014-11-07 LAB — HM MAMMOGRAPHY: HM MAMMO: NORMAL

## 2014-11-08 ENCOUNTER — Encounter: Payer: Self-pay | Admitting: *Deleted

## 2014-11-08 ENCOUNTER — Encounter: Payer: Self-pay | Admitting: Family Medicine

## 2014-12-11 ENCOUNTER — Other Ambulatory Visit: Payer: Self-pay | Admitting: Family Medicine

## 2015-01-22 ENCOUNTER — Other Ambulatory Visit: Payer: Self-pay | Admitting: Family Medicine

## 2015-02-27 ENCOUNTER — Other Ambulatory Visit: Payer: Self-pay | Admitting: Family Medicine

## 2015-03-07 ENCOUNTER — Other Ambulatory Visit: Payer: Self-pay | Admitting: Family Medicine

## 2015-03-27 ENCOUNTER — Encounter: Payer: Self-pay | Admitting: Family Medicine

## 2015-03-27 ENCOUNTER — Ambulatory Visit (INDEPENDENT_AMBULATORY_CARE_PROVIDER_SITE_OTHER): Payer: Medicare Other | Admitting: Family Medicine

## 2015-03-27 VITALS — BP 146/90 | HR 59 | Temp 98.2°F | Ht 60.25 in | Wt 153.8 lb

## 2015-03-27 DIAGNOSIS — L97309 Non-pressure chronic ulcer of unspecified ankle with unspecified severity: Secondary | ICD-10-CM | POA: Insufficient documentation

## 2015-03-27 DIAGNOSIS — D1720 Benign lipomatous neoplasm of skin and subcutaneous tissue of unspecified limb: Secondary | ICD-10-CM | POA: Diagnosis not present

## 2015-03-27 DIAGNOSIS — L97311 Non-pressure chronic ulcer of right ankle limited to breakdown of skin: Secondary | ICD-10-CM

## 2015-03-27 DIAGNOSIS — I10 Essential (primary) hypertension: Secondary | ICD-10-CM | POA: Diagnosis not present

## 2015-03-27 MED ORDER — DOXYCYCLINE HYCLATE 100 MG PO TABS: 100.0000 mg | ORAL_TABLET | Freq: Two times a day (BID) | ORAL | Status: DC

## 2015-03-27 NOTE — Assessment & Plan Note (Signed)
approx 4 cm diam soft rubbery mass over L ant shoulder resembles lipoma  Not bothersome Nl rom joint Will observe

## 2015-03-27 NOTE — Patient Instructions (Addendum)
Keep ulcer on ankle lightly covered with antibiotic ointment and wash with soap and water  Elevate it when you are sitting  If more red/swollen/drainage or any streaking or fever- alert me  Update if not starting to improve in a week or if worsening    The lump on your shoulder is likely a lipoma (fatty mass)- we can watch it -if it grows and /or becomes bothersome please let me know   We need to watch your blood pressure  Goal is under 140/90   If you buy a blood pressure cuff- get OMRON for the arm regular size  Check it when you are relaxed  If it stays over 140/90 - please come in and bring it with your

## 2015-03-27 NOTE — Progress Notes (Signed)
Subjective:    Patient ID: Wendy Krueger, female    DOB: [birth year], 81 y.o.   MRN: [id number]  HPI Here for a "place" on foot and shoulder   R ankle - started with a blister that popped  Red and then drained pus  No pain  Cleaning with peroxide   Knot on her L shoulder  For "a while"  Had 2 , now just one  Does not bother her or hurt   bp is high today  Family puts her med out BP Readings from Last 3 Encounters:  03/27/15 172/94  09/05/14 148/88  06/20/14 146/84   no symptoms  Head feels fine  Tends to have whitecoat symptoms  BP: (!) 146/90 mmHg improved on re check      Patient Active Problem List   Diagnosis Date Noted  . Colles' fracture of right radius 09/06/2014  . Right hand pain 09/05/2014  . Right wrist pain 09/05/2014  . Urticaria 06/20/2014  . Allergic rhinitis 06/20/2014  . Erosive gastritis 04/28/2014  . Duodenitis with bleeding 04/28/2014  . Normocytic anemia 04/28/2014  . Diastolic dysfunction 53/97/6734  . Anemia, iron deficiency 03/25/2014  . Melena 03/21/2014  . GI bleed 03/21/2014  . Unspecified gastritis and gastroduodenitis without mention of hemorrhage 03/21/2014  . Near syncope 03/19/2014  . BRBPR (bright red blood per rectum) 03/19/2014  . Diarrhea 03/19/2014  . Renal insufficiency 01/11/2014  . Dementia 09/01/2013  . Memory loss 09/01/2013  . Anxiety 09/01/2013  . Encounter for Medicare annual wellness exam 01/08/2013  . Colitis 11/11/2011  . Gallstones 11/11/2011  . 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 Left hand weakness 06/04/2011  . Risk for falls 06/04/2011  . Constipation 03/05/2011  . Leg pain, bilateral 01/16/2011  . UNSPECIFIED VITAMIN D DEFICIENCY 03/28/2009  . HYPERCHOLESTEROLEMIA, PURE 03/17/2007  . Essential hypertension 03/16/2007  . OSTEOARTHRITIS 03/16/2007  . OSTEOPOROSIS 03/16/2007   Past Medical History  Diagnosis Date  . Hypertension   . Osteoarthritis   . Osteoporosis   .  Hyperlipidemia   . Colitis - presumed infectious origin     one ER visit   . Asymptomatic gallstones    Past Surgical History  Procedure Laterality Date  . Abdominal hysterectomy      BSO- fibroids  . Wrist fracture surgery  2/01    R arm  . Abi's      normal  . Left foot brace    . Dexa  1/05    osteoporosis  . Esophagogastroduodenoscopy N/A 03/21/2014    Procedure: ESOPHAGOGASTRODUODENOSCOPY (EGD);  Surgeon: Milus Banister, MD;  Location: [facility];  Service: Endoscopy;  Laterality: N/A;   Social History  Substance Use Topics  . Smoking status: Never Smoker   . Smokeless tobacco: Never Used  . Alcohol Use: No   Family History  Problem Relation Age of Onset  . Heart attack Father   . Hypertension Father   . Heart attack Mother   . Heart attack Father   . Throat cancer Brother    Allergies  Allergen Reactions  . Alendronate Sodium     Leg pain   . Amlodipine Besylate Hives  . Calcitonin ([place]) Other (See Comments)     headache/ head pressure  . Simvastatin Other (See Comments)    Leg pain    Current Outpatient Prescriptions on File Prior to Visit  Medication Sig Dispense Refill  . aspirin 81 MG chewable tablet Chew 81 mg by mouth daily.    Marland Kitchen  cholecalciferol (VITAMIN D) 1000 UNITS tablet Take 1,000 Units by mouth daily.    Marland Kitchen docusate sodium (COLACE) 100 MG capsule Take 100 mg by mouth daily as needed.     . donepezil (ARICEPT) 10 MG tablet TAKE 1 TABLET AT BEDTIME 30 tablet 3  . ferrous sulfate 325 (65 FE) MG tablet TAKE 1 TABLET (325 MG TOTAL) BY MOUTH DAILY WITH BREAKFAST. 30 tablet 5  . KLOR-CON M10 10 MEQ tablet TAKE 2 TABLETS BY MOUTH DAILY 180 tablet 3  . lisinopril (PRINIVIL,ZESTRIL) 10 MG tablet Take 1 tablet (10 mg total) by mouth daily. 90 tablet 1  . meclizine (ANTIVERT) 25 MG tablet Take 25 mg by mouth 3 (three) times daily as needed for dizziness.    . metoprolol (LOPRESSOR) 50 MG tablet TAKE 1 TABLET (50 MG TOTAL) BY MOUTH 2 (TWO) TIMES DAILY.  180 tablet 0  . Multiple Vitamins-Minerals (MULTIVITAMIN PO) Take 1 tablet by mouth daily.    Marland Kitchen omeprazole (PRILOSEC) 20 MG capsule TAKE ONE CAPSULE BY MOUTH TWICE A DAY 60 capsule 2  . vitamin B-12 (CYANOCOBALAMIN) 1000 MCG tablet Take 1,000 mcg by mouth daily.     No current facility-administered medications on file prior to visit.    Review of Systems Review of Systems  Constitutional: Negative for fever, appetite change, fatigue and unexpected weight change.  Eyes: Negative for pain and visual disturbance.  Respiratory: Negative for cough and shortness of breath.   Cardiovascular: Negative for cp or palpitations    Gastrointestinal: Negative for nausea, diarrhea and constipation.  Genitourinary: Negative for urgency and frequency.  Skin: Negative for pallor or rash  pos for wound on R ankle  Neurological: Negative for weakness, light-headedness, numbness and headaches.  Hematological: Negative for adenopathy. Does not bruise/bleed easily.  Psychiatric/Behavioral: Negative for dysphoric mood. The patient is not nervous/anxious.         Objective:   Physical Exam  Constitutional: She appears well-developed and well-nourished. No distress.  Well appearing elderly female   HENT:  Head: Normocephalic and atraumatic.  Mouth/Throat: Oropharynx is clear and moist.  Eyes: Conjunctivae and EOM are normal. Pupils are equal, round, and reactive to light.  Neck: Normal range of motion. Neck supple. No JVD present. Carotid bruit is not present. No thyromegaly present.  Cardiovascular: Normal rate, regular rhythm, normal heart sounds and intact distal pulses.  Exam reveals no gallop.   Pulmonary/Chest: Effort normal and breath sounds normal. No respiratory distress. She has no wheezes. She has no rales.  No crackles  Abdominal: Soft. Bowel sounds are normal. She exhibits no distension, no abdominal bruit and no mass. There is no tenderness.  Musculoskeletal: She exhibits no edema or  tenderness.  4 cm rubbery mass on anterior L shoulder consistent with lipoma nt Nl rom joint and UE   Lymphadenopathy:    She has no cervical adenopathy.  Neurological: She is alert. She has normal reflexes.  Skin: Skin is warm and dry. No rash noted.  1 cm superficial abrasion /early ulcer just above R medial ankle that is nt  Scant collar of erythema No drainage Nl rom joint    Psychiatric: She has a normal mood and affect.          Assessment & Plan:   Problem List Items Addressed This Visit      Cardiovascular and Mediastinum   Essential hypertension    bp elevated today in pt with hx of white coat syndrome Will start checking bp at home while  relaxed and update if not under 140/90 most checks Also disc low sodium diet (handout given on DASH diet) and exercise as tolerated         Musculoskeletal and Integument   Skin ulcer of ankle    Small/ 1 cm superficial ulcer of R medial ankle -presume from scratching insect bite  No discharge  Mildly tender tx for infx with doxycycline  Disc wound care in detail -see AVS Update if not starting to improve in a week or if worsening          Other   Lipoma of shoulder - Primary    approx 4 cm diam soft rubbery mass over L ant shoulder resembles lipoma  Not bothersome Nl rom joint Will observe

## 2015-03-27 NOTE — Assessment & Plan Note (Signed)
bp elevated today in pt with hx of white coat syndrome Will start checking bp at home while relaxed and update if not under 140/90 most checks Also disc low sodium diet (handout given on DASH diet) and exercise as tolerated

## 2015-03-27 NOTE — Progress Notes (Signed)
Pre visit review using our clinic review tool, if applicable. No additional management support is needed unless otherwise documented below in the visit note. 

## 2015-03-27 NOTE — Assessment & Plan Note (Signed)
Small/ 1 cm superficial ulcer of R medial ankle -presume from scratching insect bite  No discharge  Mildly tender tx for infx with doxycycline  Disc wound care in detail -see AVS Update if not starting to improve in a week or if worsening

## 2015-05-30 ENCOUNTER — Other Ambulatory Visit: Payer: Self-pay | Admitting: Family Medicine

## 2015-06-05 ENCOUNTER — Other Ambulatory Visit: Payer: Self-pay | Admitting: Family Medicine

## 2015-06-29 ENCOUNTER — Encounter: Payer: Self-pay | Admitting: *Deleted

## 2015-07-04 ENCOUNTER — Other Ambulatory Visit: Payer: Self-pay | Admitting: Family Medicine

## 2015-07-07 ENCOUNTER — Other Ambulatory Visit: Payer: Self-pay | Admitting: *Deleted

## 2015-07-07 MED ORDER — OMEPRAZOLE 20 MG PO CPDR: 20.0000 mg | DELAYED_RELEASE_CAPSULE | Freq: Two times a day (BID) | ORAL | Status: DC

## 2015-09-04 ENCOUNTER — Other Ambulatory Visit: Payer: Self-pay | Admitting: Family Medicine

## 2015-09-04 NOTE — Telephone Encounter (Signed)
Please schedule 30 minute f/u or physical in the fall and refill until then Thanks

## 2015-09-04 NOTE — Telephone Encounter (Signed)
Pt had an acute appt on 03/27/15 but no recent f/u or CPE and no future appt., please advise

## 2015-09-05 NOTE — Telephone Encounter (Signed)
Left voicemail requesting pt to call office back 

## 2015-09-06 NOTE — Telephone Encounter (Signed)
appt scheduled and med refilled 

## 2015-09-18 ENCOUNTER — Other Ambulatory Visit: Payer: Self-pay | Admitting: Family Medicine

## 2015-09-19 ENCOUNTER — Encounter: Payer: Self-pay | Admitting: Family Medicine

## 2015-09-19 ENCOUNTER — Ambulatory Visit (INDEPENDENT_AMBULATORY_CARE_PROVIDER_SITE_OTHER): Payer: Medicare Other | Admitting: Family Medicine

## 2015-09-19 VITALS — BP 152/98 | HR 75 | Temp 98.3°F | Ht 60.25 in | Wt 157.2 lb

## 2015-09-19 DIAGNOSIS — F0390 Unspecified dementia without behavioral disturbance: Secondary | ICD-10-CM | POA: Diagnosis not present

## 2015-09-19 DIAGNOSIS — E559 Vitamin D deficiency, unspecified: Secondary | ICD-10-CM | POA: Diagnosis not present

## 2015-09-19 DIAGNOSIS — E7800 Pure hypercholesterolemia, unspecified: Secondary | ICD-10-CM | POA: Diagnosis not present

## 2015-09-19 DIAGNOSIS — I10 Essential (primary) hypertension: Secondary | ICD-10-CM

## 2015-09-19 LAB — COMPREHENSIVE METABOLIC PANEL
ALBUMIN: 4.2 g/dL (ref 3.5–5.2)
ALK PHOS: 61 U/L (ref 39–117)
ALT: 10 U/L (ref 0–35)
AST: 17 U/L (ref 0–37)
BILIRUBIN TOTAL: 0.4 mg/dL (ref 0.2–1.2)
BUN: 15 mg/dL (ref 6–23)
CO2: 28 mEq/L (ref 19–32)
CREATININE: 0.95 mg/dL (ref 0.40–1.20)
Calcium: 10 mg/dL (ref 8.4–10.5)
Chloride: 107 mEq/L (ref 96–112)
GFR: 59.58 mL/min — ABNORMAL LOW (ref >60.00)
GLUCOSE: 92 mg/dL (ref 70–99)
Potassium: 4.2 mEq/L (ref 3.5–5.1)
SODIUM: 142 meq/L (ref 135–145)
TOTAL PROTEIN: 7.2 g/dL (ref 6.0–8.3)

## 2015-09-19 LAB — CBC WITH DIFFERENTIAL/PLATELET
BASOS ABS: 0 10*3/uL (ref 0.0–0.1)
BASOS PCT: 0.5 % (ref 0.0–3.0)
EOS PCT: 3.3 % (ref 0.0–5.0)
Eosinophils Absolute: 0.2 10*3/uL (ref 0.0–0.7)
HCT: 41.5 % (ref 36.0–46.0)
HEMOGLOBIN: 13.9 g/dL (ref 12.0–15.0)
Lymphocytes Relative: 31.6 % (ref 12.0–46.0)
Lymphs Abs: 1.6 10*3/uL (ref 0.7–4.0)
MCHC: 33.4 g/dL (ref 30.0–36.0)
MCV: 89 fl (ref 78.0–100.0)
MONO ABS: 0.4 10*3/uL (ref 0.1–1.0)
MONOS PCT: 7.5 % (ref 3.0–12.0)
NEUTROS PCT: 57.1 % (ref 43.0–77.0)
Neutro Abs: 2.9 10*3/uL (ref 1.4–7.7)
Platelets: 197 10*3/uL (ref 150.0–400.0)
RBC: 4.66 Mil/uL (ref 3.87–5.11)
RDW: 13.9 % (ref 11.5–15.5)
WBC: 5 10*3/uL (ref 4.0–10.5)

## 2015-09-19 LAB — LIPID PANEL
Cholesterol: 303 mg/dL — ABNORMAL HIGH (ref 0–200)
HDL: 53.7 mg/dL (ref >39.00)
NONHDL: 248.83
Total CHOL/HDL Ratio: 6
Triglycerides: 253 mg/dL — ABNORMAL HIGH (ref 0.0–149.0)
VLDL: 50.6 mg/dL — ABNORMAL HIGH (ref 0.0–40.0)

## 2015-09-19 LAB — VITAMIN D 25 HYDROXY (VIT D DEFICIENCY, FRACTURES): VITD: 38.75 ng/mL (ref 30.00–100.00)

## 2015-09-19 LAB — LDL CHOLESTEROL, DIRECT: Direct LDL: 182 mg/dL

## 2015-09-19 LAB — TSH: TSH: 1.47 u[IU]/mL (ref 0.35–4.50)

## 2015-09-19 MED ORDER — POTASSIUM CHLORIDE CRYS ER 10 MEQ PO TBCR: 20.0000 meq | EXTENDED_RELEASE_TABLET | Freq: Every day | ORAL | Status: DC

## 2015-09-19 MED ORDER — METOPROLOL TARTRATE 50 MG PO TABS: ORAL_TABLET | ORAL | Status: DC

## 2015-09-19 MED ORDER — DONEPEZIL HCL 10 MG PO TABS: 10.0000 mg | ORAL_TABLET | Freq: Every day | ORAL | Status: DC

## 2015-09-19 NOTE — Progress Notes (Signed)
Subjective:    Patient ID: Wendy Krueger, female    DOB: [birth year], 82 y.o.   MRN: [id number]  HPI Here for f/u of chronic health problems   bp is up today -out of metoprolol since Saturday and also has hx of whitecoat syndrome  No cp or palpitations or headaches or edema  No side effects to medicines  Has not bought a cuff for home  BP Readings from Last 3 Encounters:  09/19/15 152/98  03/27/15 146/90  09/05/14 148/88     Hx of cva in the past   Hx of dementia -on aricept 10 mg  Some days family thing she is really good /remembers a lot  Other days - worse /forgets things and forgets names a lot  Does not cook anymore -family has that covered  Her other daughter lives with her  Always accompanies  her outdoors at all times (also helps her with stairs)  Eating has improved -eating more regularly Wt is up 4 lb with bmi of 30  She does eat some sweets  She is getting protein with meals   Uses a cane for ambulation    Has not had labs in a while   Hx of low K- needs K refilled Lab Results  Component Value Date   CREATININE 1.0 06/20/2014   BUN 17 06/20/2014   NA 140 06/20/2014   K 4.0 06/20/2014   CL 103 06/20/2014   CO2 30 06/20/2014    Patient Active Problem List   Diagnosis Date Noted  . Lipoma of shoulder 03/27/2015  . Colles' fracture of right radius 09/06/2014  . Urticaria 06/20/2014  . Allergic rhinitis 06/20/2014  . Erosive gastritis 04/28/2014  . Duodenitis with bleeding 04/28/2014  . Normocytic anemia 04/28/2014  . Diastolic dysfunction [id number]  . Anemia, iron deficiency 03/25/2014  . Melena 03/21/2014  . GI bleed 03/21/2014  . Unspecified gastritis and gastroduodenitis without mention of hemorrhage 03/21/2014  . Near syncope 03/19/2014  . BRBPR (bright red blood per rectum) 03/19/2014  . Renal insufficiency 01/11/2014  . Dementia 09/01/2013  . Memory loss 09/01/2013  . Anxiety 09/01/2013  . Encounter for Medicare annual wellness exam  01/08/2013  . Colitis 11/11/2011  . Gallstones 11/11/2011  . 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 Left hand weakness 06/04/2011  . Risk for falls 06/04/2011  . Constipation 03/05/2011  . Vitamin D deficiency 03/28/2009  . HYPERCHOLESTEROLEMIA, PURE 03/17/2007  . Essential hypertension 03/16/2007  . OSTEOARTHRITIS 03/16/2007  . OSTEOPOROSIS 03/16/2007   Past Medical History  Diagnosis Date  . Hypertension   . Osteoarthritis   . Osteoporosis   . Hyperlipidemia   . Colitis - presumed infectious origin     one ER visit   . Asymptomatic gallstones    Past Surgical History  Procedure Laterality Date  . Abdominal hysterectomy      BSO- fibroids  . Wrist fracture surgery  2/01    R arm  . Abi's      normal  . Left foot brace    . Dexa  1/05    osteoporosis  . Esophagogastroduodenoscopy N/A 03/21/2014    Procedure: ESOPHAGOGASTRODUODENOSCOPY (EGD);  Surgeon: Milus Banister, MD;  Location: [facility];  Service: Endoscopy;  Laterality: N/A;   Social History  Substance Use Topics  . Smoking status: Never Smoker   . Smokeless tobacco: Never Used  . Alcohol Use: No   Family History  Problem Relation Age of Onset  . Heart attack Father   .  Hypertension Father   . Heart attack Mother   . Heart attack Father   . Throat cancer Brother    Allergies  Allergen Reactions  . Alendronate Sodium     Leg pain   . Amlodipine Besylate Hives  . Calcitonin ([place]) Other (See Comments)     headache/ head pressure  . Simvastatin Other (See Comments)    Leg pain    Current Outpatient Prescriptions on File Prior to Visit  Medication Sig Dispense Refill  . aspirin 81 MG chewable tablet Chew 81 mg by mouth daily.    Marland Kitchen docusate sodium (COLACE) 100 MG capsule Take 100 mg by mouth daily as needed.     Marland Kitchen lisinopril (PRINIVIL,ZESTRIL) 10 MG tablet TAKE 1 TABLET (10 MG TOTAL) BY MOUTH DAILY. 30 tablet 5  . meclizine (ANTIVERT) 25 MG tablet Take 25 mg by mouth 3 (three) times  daily as needed for dizziness.    Marland Kitchen omeprazole (PRILOSEC) 20 MG capsule Take 1 capsule (20 mg total) by mouth 2 (two) times daily. 180 capsule 0  . vitamin B-12 (CYANOCOBALAMIN) 1000 MCG tablet Take 1,000 mcg by mouth daily.    . ferrous sulfate 325 (65 FE) MG tablet TAKE 1 TABLET (325 MG TOTAL) BY MOUTH DAILY WITH BREAKFAST. 30 tablet 5   No current facility-administered medications on file prior to visit.     Review of Systems Review of Systems  Constitutional: Negative for fever, appetite change, fatigue and unexpected weight change.  Eyes: Negative for pain and visual disturbance.  Respiratory: Negative for cough and shortness of breath.   Cardiovascular: Negative for cp or palpitations    Gastrointestinal: Negative for nausea, diarrhea and constipation.  Genitourinary: Negative for urgency and frequency.  Skin: Negative for pallor or rash   Neurological: Negative for weakness, light-headedness, numbness and headaches.  Hematological: Negative for adenopathy. Does not bruise/bleed easily.  Psychiatric/Behavioral: Negative for dysphoric mood. The patient is at times nervous/anxious.  pos for dementia which is stable        Objective:   Physical Exam  Constitutional: She appears well-developed and well-nourished. No distress.  Frail but well appearing elderly female with dementia  Family present  HENT:  Head: Normocephalic and atraumatic.  Mouth/Throat: Oropharynx is clear and moist.  Eyes: Conjunctivae and EOM are normal. Pupils are equal, round, and reactive to light.  Neck: Normal range of motion. Neck supple. No JVD present. Carotid bruit is not present. No thyromegaly present.  Cardiovascular: Normal rate, regular rhythm, normal heart sounds and intact distal pulses.  Exam reveals no gallop.   Pulmonary/Chest: Effort normal and breath sounds normal. No respiratory distress. She has no wheezes. She has no rales.  No crackles  Abdominal: Soft. Bowel sounds are normal. She  exhibits no distension, no abdominal bruit and no mass. There is no tenderness.  Musculoskeletal: She exhibits no edema or tenderness.  Lymphadenopathy:    She has no cervical adenopathy.  Neurological: She is alert. She has normal reflexes. No cranial nerve deficit. She exhibits normal muscle tone. Coordination normal.  Walks with a cane  Skin: Skin is warm and dry. No rash noted.  Psychiatric: She has a normal mood and affect. Her behavior is normal. Thought content normal. Cognition and memory are impaired.  Short term memory is impaired  Today is a good day for her - answers questions appropriately but takes a while   Does not seem overly anxious today          Assessment &  Plan:   Problem List Items Addressed This Visit      Cardiovascular and Mediastinum   Essential hypertension - Primary    bp is up today off the metoprolol meds were refilled and disc compliance/ family will help  DASH diet if poss/activity as tol  Enc to get home bp cuff- suggested OMRON if they do  / she has some whitecoat syndrome - check bp twice weekly  F/u planned for 3 mo       Relevant Medications   metoprolol (LOPRESSOR) 50 MG tablet   Other Relevant Orders   CBC with Differential/Platelet (Completed)   Comprehensive metabolic panel (Completed)   TSH (Completed)   Lipid panel (Completed)     Nervous and Auditory   Dementia    Pt and family think this is overall stable on aricept  Getting help at home and feels safe  Some days are better than others (stress/fatigue make it worse) Will continue medication and monitor      Relevant Medications   donepezil (ARICEPT) 10 MG tablet     Other   HYPERCHOLESTEROLEMIA, PURE    Diet is fair Declines med due to age  37 low sat fat diet  Lab today      Relevant Medications   metoprolol (LOPRESSOR) 50 MG tablet   Other Relevant Orders   Lipid panel (Completed)   Vitamin D deficiency    With hx of OP and falls - this is imp to bone and  overall health Level today on current supplementation       Relevant Orders   VITAMIN D 25 Hydroxy (Vit-D Deficiency, Fractures) (Completed)

## 2015-09-19 NOTE — Patient Instructions (Addendum)
If you want to start checking blood pressure at home - get the OMRON brand cuff for the arm / size regular  Check bp when relaxed - if it stays above 140/90 routinely let me know  Get back on metoprolol  Labs today  Follow up in 3 months for annual exam

## 2015-09-19 NOTE — Progress Notes (Signed)
Pre visit review using our clinic review tool, if applicable. No additional management support is needed unless otherwise documented below in the visit note. 

## 2015-09-20 ENCOUNTER — Encounter: Payer: Self-pay | Admitting: *Deleted

## 2015-09-20 NOTE — Assessment & Plan Note (Addendum)
Diet is fair Declines med due to age  82 low sat fat diet  Lab today

## 2015-09-20 NOTE — Assessment & Plan Note (Signed)
Pt and family think this is overall stable on aricept  Getting help at home and feels safe  Some days are better than others (stress/fatigue make it worse) Will continue medication and monitor

## 2015-09-20 NOTE — Assessment & Plan Note (Signed)
bp is up today off the metoprolol meds were refilled and disc compliance/ family will help  DASH diet if poss/activity as tol  Enc to get home bp cuff- suggested OMRON if they do  / she has some whitecoat syndrome - check bp twice weekly  F/u planned for 3 mo

## 2015-09-20 NOTE — Assessment & Plan Note (Signed)
With hx of OP and falls - this is imp to bone and overall health Level today on current supplementation

## 2015-09-28 ENCOUNTER — Telehealth: Payer: Self-pay | Admitting: Family Medicine

## 2015-09-28 ENCOUNTER — Emergency Department (HOSPITAL_COMMUNITY)
Admission: EM | Admit: 2015-09-28 | Discharge: 2015-09-28 | Disposition: A | Discharge status: Home / Self Care | Payer: Medicare Other | Attending: Emergency Medicine | Admitting: Emergency Medicine

## 2015-09-28 ENCOUNTER — Encounter (HOSPITAL_COMMUNITY): Payer: Self-pay

## 2015-09-28 DIAGNOSIS — Z79899 Other long term (current) drug therapy: Secondary | ICD-10-CM | POA: Insufficient documentation

## 2015-09-28 DIAGNOSIS — Z8719 Personal history of other diseases of the digestive system: Secondary | ICD-10-CM | POA: Diagnosis not present

## 2015-09-28 DIAGNOSIS — M1990 Unspecified osteoarthritis, unspecified site: Secondary | ICD-10-CM | POA: Insufficient documentation

## 2015-09-28 DIAGNOSIS — Z8619 Personal history of other infectious and parasitic diseases: Secondary | ICD-10-CM | POA: Insufficient documentation

## 2015-09-28 DIAGNOSIS — Z8639 Personal history of other endocrine, nutritional and metabolic disease: Secondary | ICD-10-CM | POA: Insufficient documentation

## 2015-09-28 DIAGNOSIS — Z7982 Long term (current) use of aspirin: Secondary | ICD-10-CM | POA: Insufficient documentation

## 2015-09-28 DIAGNOSIS — I10 Essential (primary) hypertension: Secondary | ICD-10-CM | POA: Insufficient documentation

## 2015-09-28 LAB — I-STAT CHEM 8, ED
BUN: 21 mg/dL — ABNORMAL HIGH (ref 6–20)
CALCIUM ION: 1.18 mmol/L (ref 1.13–1.30)
CHLORIDE: 103 mmol/L (ref 101–111)
Creatinine, Ser: 1.1 mg/dL — ABNORMAL HIGH (ref 0.44–1.00)
GLUCOSE: 93 mg/dL (ref 65–99)
HCT: 41 % (ref 36.0–46.0)
Hemoglobin: 13.9 g/dL (ref 12.0–15.0)
Potassium: 4 mmol/L (ref 3.5–5.1)
Sodium: 141 mmol/L (ref 135–145)
TCO2: 29 mmol/L (ref 0–100)

## 2015-09-28 MED ORDER — LISINOPRIL 10 MG PO TABS: 20.0000 mg | ORAL_TABLET | Freq: Every day | ORAL | Status: DC

## 2015-09-28 NOTE — ED Notes (Signed)
Reported bp 224/98 to dr. Regenia Skeeter. md acknowledges, no orders at this time.

## 2015-09-28 NOTE — ED Notes (Addendum)
Patient here for BP up for the past several days, denies pain. Patient seen by MD earlier in week but no changes to meds. No distress. Patient taking meds as prescribed, alert and no complaints

## 2015-09-28 NOTE — ED Provider Notes (Signed)
CSN: [id number]     Arrival date & time 09/28/15  1647 History   First MD Initiated Contact with Patient 09/28/15 1920     Chief Complaint  Patient presents with  . Hypertension     (Consider location/radiation/quality/duration/timing/severity/associated sxs/prior Treatment) HPI  Patient name is an 82 year old female who presents emergency Department with hypertension. She was brought in by her family for systolic pressures above [id number]. The patient says she has been taking her medication as directed. She denies chest pain, shortness of breath, visual disturbances, headaches, dizziness, racing heart. She has been monitoring her pressures at home, but consistently elevated blood pressures on metoprolol and lisinopril. Her primary care physician was concerned for white coat hypertension. She denies over-the-counter stimulant use such as Sudafed or cough medicines.  Past Medical History  Diagnosis Date  . Hypertension   . Osteoarthritis   . Osteoporosis   . Hyperlipidemia   . Colitis - presumed infectious origin     one ER visit   . Asymptomatic gallstones    Past Surgical History  Procedure Laterality Date  . Abdominal hysterectomy      BSO- fibroids  . Wrist fracture surgery  2/01    R arm  . Abi's      normal  . Left foot brace    . Dexa  1/05    osteoporosis  . Esophagogastroduodenoscopy N/A 03/21/2014    Procedure: ESOPHAGOGASTRODUODENOSCOPY (EGD);  Surgeon: Milus Banister, MD;  Location: [facility];  Service: Endoscopy;  Laterality: N/A;   Family History  Problem Relation Age of Onset  . Heart attack Father   . Hypertension Father   . Heart attack Mother   . Heart attack Father   . Throat cancer Brother    Social History  Substance Use Topics  . Smoking status: Never Smoker   . Smokeless tobacco: Never Used  . Alcohol Use: No   OB History    No data available     Review of Systems  Ten systems reviewed and are negative for acute change, except as noted in  the HPI.    Allergies  Alendronate sodium; Amlodipine besylate; Calcitonin (salmon); and Simvastatin  Home Medications   Prior to Admission medications   Medication Sig Start Date End Date Taking? Authorizing Provider  aspirin 81 MG chewable tablet Chew 81 mg by mouth daily.   Yes Historical Provider, MD  Cholecalciferol (VITAMIN D-3) 1000 units CAPS Take 2 capsules by mouth daily.    Yes Historical Provider, MD  donepezil (ARICEPT) 10 MG tablet Take 1 tablet (10 mg total) by mouth at bedtime. 09/19/15  Yes Abner Greenspan, MD  ferrous sulfate 325 (65 FE) MG tablet TAKE 1 TABLET (325 MG TOTAL) BY MOUTH DAILY WITH BREAKFAST. 04/19/14  Yes Abner Greenspan, MD  Fexofenadine HCl (ALLERGY 24-HR PO) Take 1 capsule by mouth daily.   Yes Historical Provider, MD  lisinopril (PRINIVIL,ZESTRIL) 10 MG tablet TAKE 1 TABLET (10 MG TOTAL) BY MOUTH DAILY. 05/30/15  Yes Abner Greenspan, MD  metoprolol (LOPRESSOR) 50 MG tablet TAKE 1 TABLET (50 MG TOTAL) BY MOUTH 2 (TWO) TIMES DAILY. 09/19/15  Yes Abner Greenspan, MD  Multiple Vitamins-Minerals ([facility] SPECTRAVITE PO) Take 1 capsule by mouth daily.   Yes Historical Provider, MD  omeprazole (PRILOSEC) 20 MG capsule Take 1 capsule (20 mg total) by mouth 2 (two) times daily. 07/07/15  Yes [place], MD  potassium chloride (KLOR-CON M10) 10 MEQ tablet Take 2 tablets (20 mEq  total) by mouth daily. 09/19/15  Yes Abner Greenspan, MD  vitamin B-12 (CYANOCOBALAMIN) 1000 MCG tablet Take 1,000 mcg by mouth daily.   Yes Historical Provider, MD   BP 192/99 mmHg  Pulse 63  Temp(Src) 98.6 F (37 C) (Oral)  Resp 17  SpO2 100% Physical Exam  Constitutional: She is oriented to person, place, and time. She appears well-developed and well-nourished. No distress.  HENT:  Head: Normocephalic and atraumatic.  Right Ear: External ear normal.  Left Ear: External ear normal.  Mouth/Throat: Oropharynx is clear and moist. No oropharyngeal exudate.  Eyes: Conjunctivae and EOM are normal.  Pupils are equal, round, and reactive to light. No scleral icterus.  No horizontal, vertical or rotational nystagmus  Neck: Normal range of motion. Neck supple. No JVD present. No thyromegaly present.  Cardiovascular: Normal rate, regular rhythm, normal heart sounds and intact distal pulses.  Exam reveals no gallop and no friction rub.   No murmur heard. Pulmonary/Chest: Effort normal and breath sounds normal. No respiratory distress. She has no wheezes. She has no rales. She exhibits no tenderness.  Abdominal: Soft. Bowel sounds are normal. She exhibits no distension and no mass. There is no tenderness. There is no rebound and no guarding.  Musculoskeletal: Normal range of motion. She exhibits no edema or tenderness.  Lymphadenopathy:    She has no cervical adenopathy.  Neurological: She is alert and oriented to person, place, and time. She has normal reflexes. No cranial nerve deficit. She exhibits normal muscle tone. Coordination normal.  Mental Status:  Alert, oriented, thought content appropriate. Speech fluent without evidence of aphasia. Able to follow 2 step commands without difficulty.  Cranial Nerves:  II:  Peripheral visual fields grossly normal, pupils equal, round, reactive to light III,IV, VI: ptosis not present, extra-ocular motions intact bilaterally  V,VII: smile symmetric, facial light touch sensation equal VIII: hearing grossly normal bilaterally  IX,X: midline uvula rise  XI: bilateral shoulder shrug equal and strong XII: midline tongue extension  Motor:  5/5 in upper and lower extremities bilaterally including strong and equal grip strength and dorsiflexion/plantar flexion Sensory: Pinprick and light touch normal in all extremities.  Deep Tendon Reflexes: 2+ and symmetric  Cerebellar: normal finger-to-nose with bilateral upper extremities Gait: normal gait and balance CV: distal pulses palpable throughout   Skin: Skin is warm and dry. No rash noted. She is not  diaphoretic.  Psychiatric: She has a normal mood and affect. Her behavior is normal. Judgment and thought content normal.  Nursing note and vitals reviewed.   ED Course  Procedures (including critical care time) Labs Review Labs Reviewed  I-STAT CHEM 8, ED - Abnormal; Notable for the following:    BUN 21 (*)    Creatinine, Ser 1.10 (*)    All other components within normal limits    Imaging Review No results found. I have personally reviewed and evaluated these images and lab results as part of my medical decision-making.   EKG Interpretation   Date/Time:  Thursday September 28 2015 20:26:50 EDT Ventricular Rate:  66 PR Interval:  136 QRS Duration: 141 QT Interval:  473 QTC Calculation: 496 R Axis:   -34 Text Interpretation:  Sinus rhythm Left bundle branch block no significant  change since 2015 Confirmed by GOLDSTON  MD, SCOTT ([id number]) on 09/28/2015  9:22:29 PM      MDM   Final diagnoses:  Essential hypertension    Patient noted to be hypertensive in the emergency department.  No signs of  hypertensive urgency or emergency. Increased her lisinopril dose from 10-20 mg daily  Discussed with patient the need for close follow-up and management by their primary care physician. Follow-up with PCP in the next 24-48 hours.     Margarita Mail, PA-C 09/29/15 [id number]  Sherwood Gambler, MD 10/03/15 [phone]

## 2015-09-28 NOTE — Discharge Instructions (Signed)
Hypertension Hypertension, commonly called high blood pressure, is when the force of blood pumping through your arteries is too strong. Your arteries are the blood vessels that carry blood from your heart throughout your body. A blood pressure reading consists of a higher number over a lower number, such as 110/72. The higher number (systolic) is the pressure inside your arteries when your heart pumps. The lower number (diastolic) is the pressure inside your arteries when your heart relaxes. Ideally you want your blood pressure below 120/80. Hypertension forces your heart to work harder to pump blood. Your arteries may become narrow or stiff. Having untreated or uncontrolled hypertension can cause heart attack, stroke, kidney disease, and other problems. RISK FACTORS Some risk factors for high blood pressure are controllable. Others are not.  Risk factors you cannot control include:   Race. You may be at higher risk if you are African American.  Age. Risk increases with age.  Gender. Men are at higher risk than women before age 45 years. After age 65, women are at higher risk than men. Risk factors you can control include:  Not getting enough exercise or physical activity.  Being overweight.  Getting too much fat, sugar, calories, or salt in your diet.  Drinking too much alcohol. SIGNS AND SYMPTOMS Hypertension does not usually cause signs or symptoms. Extremely high blood pressure (hypertensive crisis) may cause headache, anxiety, shortness of breath, and nosebleed. DIAGNOSIS To check if you have hypertension, your health care provider will measure your blood pressure while you are seated, with your arm held at the level of your heart. It should be measured at least twice using the same arm. Certain conditions can cause a difference in blood pressure between your right and left arms. A blood pressure reading that is higher than normal on one occasion does not mean that you need treatment. If  it is not clear whether you have high blood pressure, you may be asked to return on a different day to have your blood pressure checked again. Or, you may be asked to monitor your blood pressure at home for 1 or more weeks. TREATMENT Treating high blood pressure includes making lifestyle changes and possibly taking medicine. Living a healthy lifestyle can help lower high blood pressure. You may need to change some of your habits. Lifestyle changes may include:  Following the DASH diet. This diet is high in fruits, vegetables, and whole grains. It is low in salt, red meat, and added sugars.  Keep your sodium intake below 2,300 mg per day.  Getting at least 30-45 minutes of aerobic exercise at least 4 times per week.  Losing weight if necessary.  Not smoking.  Limiting alcoholic beverages.  Learning ways to reduce stress. Your health care provider may prescribe medicine if lifestyle changes are not enough to get your blood pressure under control, and if one of the following is true:  You are 18-59 years of age and your systolic blood pressure is above 140.  You are 60 years of age or older, and your systolic blood pressure is above 150.  Your diastolic blood pressure is above 90.  You have diabetes, and your systolic blood pressure is over 140 or your diastolic blood pressure is over 90.  You have kidney disease and your blood pressure is above 140/90.  You have heart disease and your blood pressure is above 140/90. Your personal target blood pressure may vary depending on your medical conditions, your age, and other factors. HOME CARE INSTRUCTIONS    Have your blood pressure rechecked as directed by your health care provider.   Take medicines only as directed by your health care provider. Follow the directions carefully. Blood pressure medicines must be taken as prescribed. The medicine does not work as well when you skip doses. Skipping doses also puts you at risk for  problems.  Do not smoke.   Monitor your blood pressure at home as directed by your health care provider. SEEK MEDICAL CARE IF:   You think you are having a reaction to medicines taken.  You have recurrent headaches or feel dizzy.  You have swelling in your ankles.  You have trouble with your vision. SEEK IMMEDIATE MEDICAL CARE IF:  You develop a severe headache or confusion.  You have unusual weakness, numbness, or feel faint.  You have severe chest or abdominal pain.  You vomit repeatedly.  You have trouble breathing. MAKE SURE YOU:   Understand these instructions.  Will watch your condition.  Will get help right away if you are not doing well or get worse.   This information is not intended to replace advice given to you by your health care provider. Make sure you discuss any questions you have with your health care provider.   Document Released: 06/17/2005 Document Revised: 11/01/2014 Document Reviewed: 04/09/2013 Elsevier Interactive Patient Education 2016 Elsevier Inc[employer] DASH Eating Plan DASH stands for "Dietary Approaches to Stop Hypertension." The DASH eating plan is a healthy eating plan that has been shown to reduce high blood pressure (hypertension). Additional health benefits may include reducing the risk of type 2 diabetes mellitus, heart disease, and stroke. The DASH eating plan may also help with weight loss. WHAT DO I NEED TO KNOW ABOUT THE DASH EATING PLAN? For the DASH eating plan, you will follow these general guidelines:  Choose foods with a percent daily value for sodium of less than 5% (as listed on the food label).  Use salt-free seasonings or herbs instead of table salt or sea salt.  Check with your health care provider or pharmacist before using salt substitutes.  Eat lower-sodium products, often labeled as "lower sodium" or "no salt added."  Eat fresh foods.  Eat more vegetables, fruits, and low-fat dairy products.  Choose whole grains.  Look for the word "whole" as the first word in the ingredient list.  Choose fish and skinless chicken or turkey more often than red meat. Limit fish, poultry, and meat to 6 oz (170 g) each day.  Limit sweets, desserts, sugars, and sugary drinks.  Choose heart-healthy fats.  Limit cheese to 1 oz (28 g) per day.  Eat more home-cooked food and less restaurant, buffet, and fast food.  Limit fried foods.  Cook foods using methods other than frying.  Limit canned vegetables. If you do use them, rinse them well to decrease the sodium.  When eating at a restaurant, ask that your food be prepared with less salt, or no salt if possible. WHAT FOODS CAN I EAT? Seek help from a dietitian for individual calorie needs. Grains Whole grain or whole wheat bread. Brown rice. Whole grain or whole wheat pasta. Quinoa, bulgur, and whole grain cereals. Low-sodium cereals. Corn or whole wheat flour tortillas. Whole grain cornbread. Whole grain crackers. Low-sodium crackers. Vegetables Fresh or frozen vegetables (raw, steamed, roasted, or grilled). Low-sodium or reduced-sodium tomato and vegetable juices. Low-sodium or reduced-sodium tomato sauce and paste. Low-sodium or reduced-sodium canned vegetables.  Fruits All fresh, canned (in natural juice), or frozen fruits. Meat and Other   Other Protein Products Ground beef (85% or leaner), grass-fed beef, or beef trimmed of fat. Skinless chicken or [place]. Ground chicken or [place]. Pork trimmed of fat. All fish and seafood. Eggs. Dried beans, peas, or lentils. Unsalted nuts and seeds. Unsalted canned beans. Dairy Low-fat dairy products, such as skim or 1% milk, 2% or reduced-fat cheeses, low-fat ricotta or cottage cheese, or plain low-fat yogurt. Low-sodium or reduced-sodium cheeses. Fats and Oils Tub margarines without trans fats. Light or reduced-fat mayonnaise and salad dressings (reduced sodium). Avocado. Safflower, olive, or canola oils. Natural peanut or  almond butter. Other Unsalted popcorn and pretzels. The items listed above may not be a complete list of recommended foods or beverages. Contact your dietitian for more options. WHAT FOODS ARE NOT RECOMMENDED? Grains White bread. White pasta. White rice. Refined cornbread. Bagels and croissants. Crackers that contain trans fat. Vegetables Creamed or fried vegetables. Vegetables in a cheese sauce. Regular canned vegetables. Regular canned tomato sauce and paste. Regular tomato and vegetable juices. Fruits Dried fruits. Canned fruit in light or heavy syrup. Fruit juice. Meat and Other Protein Products Fatty cuts of meat. Ribs, chicken wings, bacon, sausage, bologna, salami, chitterlings, fatback, hot dogs, bratwurst, and packaged luncheon meats. Salted nuts and seeds. Canned beans with salt. Dairy Whole or 2% milk, cream, half-and-half, and cream cheese. Whole-fat or sweetened yogurt. Full-fat cheeses or blue cheese. Nondairy creamers and whipped toppings. Processed cheese, cheese spreads, or cheese curds. Condiments Onion and garlic salt, seasoned salt, table salt, and sea salt. Canned and packaged gravies. Worcestershire sauce. Tartar sauce. Barbecue sauce. Teriyaki sauce. Soy sauce, including reduced sodium. Steak sauce. Fish sauce. Oyster sauce. Cocktail sauce. Horseradish. Ketchup and mustard. Meat flavorings and tenderizers. Bouillon cubes. Hot sauce. Tabasco sauce. Marinades. Taco seasonings. Relishes. Fats and Oils Butter, stick margarine, lard, shortening, ghee, and bacon fat. Coconut, palm kernel, or palm oils. Regular salad dressings. Other Pickles and olives. Salted popcorn and pretzels. The items listed above may not be a complete list of foods and beverages to avoid. Contact your dietitian for more information. WHERE CAN I FIND MORE INFORMATION? National Heart, Lung, and Blood Institute: travelstabloid.com   This information is not intended to  replace advice given to you by your health care provider. Make sure you discuss any questions you have with your health care provider.   Document Released: 06/06/2011 Document Revised: 07/08/2014 Document Reviewed: 04/21/2013 Elsevier Interactive Patient Education 2016 [place] Your High Blood Pressure Blood pressure is a measurement of how forceful your blood is pressing against the walls of the arteries. Arteries are muscular tubes within the circulatory system. Blood pressure does not stay the same. Blood pressure rises when you are active, excited, or nervous; and it lowers during sleep and relaxation. If the numbers measuring your blood pressure stay above normal most of the time, you are at risk for health problems. High blood pressure (hypertension) is a long-term (chronic) condition in which blood pressure is elevated. A blood pressure reading is recorded as two numbers, such as 120 over 80 (or 120/80). The first, higher number is called the systolic pressure. It is a measure of the pressure in your arteries as the heart beats. The second, lower number is called the diastolic pressure. It is a measure of the pressure in your arteries as the heart relaxes between beats.  Keeping your blood pressure in a normal range is important to your overall health and prevention of health problems, such as heart disease and stroke. When  your blood pressure is uncontrolled, your heart has to work harder than normal. High blood pressure is a very common condition in adults because blood pressure tends to rise with age. Men and women are equally likely to have hypertension but at different times in life. Before age 54, men are more likely to have hypertension. After 80 years of age, women are more likely to have it. Hypertension is especially common in African Americans. This condition often has no signs or symptoms. The cause of the condition is usually not known. Your caregiver can help you come up  with a plan to keep your blood pressure in a normal, healthy range. BLOOD PRESSURE STAGES Blood pressure is classified into four stages: normal, prehypertension, stage 1, and stage 2. Your blood pressure reading will be used to determine what type of treatment, if any, is necessary. Appropriate treatment options are tied to these four stages:  Normal  Systolic pressure (mm Hg): below 120.  Diastolic pressure (mm Hg): below 80. Prehypertension  Systolic pressure (mm Hg): 120 to 139.  Diastolic pressure (mm Hg): 80 to 89. Stage1  Systolic pressure (mm Hg): 140 to 159.  Diastolic pressure (mm Hg): 90 to 99. Stage2  Systolic pressure (mm Hg): 160 or above.  Diastolic pressure (mm Hg): 100 or above. RISKS RELATED TO HIGH BLOOD PRESSURE Managing your blood pressure is an important responsibility. Uncontrolled high blood pressure can lead to:  A heart attack.  A stroke.  A weakened blood vessel (aneurysm).  Heart failure.  Kidney damage.  Eye damage.  Metabolic syndrome.  Memory and concentration problems. HOW TO MANAGE YOUR BLOOD PRESSURE Blood pressure can be managed effectively with lifestyle changes and medicines (if needed). Your caregiver will help you come up with a plan to bring your blood pressure within a normal range. Your plan should include the following: Education  Read all information provided by your caregivers about how to control blood pressure.  Educate yourself on the latest guidelines and treatment recommendations. New research is always being done to further define the risks and treatments for high blood pressure. Lifestylechanges  Control your weight.  Avoid smoking.  Stay physically active.  Reduce the amount of salt in your diet.  Reduce stress.  Control any chronic conditions, such as high cholesterol or diabetes.  Reduce your alcohol intake. Medicines  Several medicines (antihypertensive medicines) are available, if needed, to  bring blood pressure within a normal range. Communication  Review all the medicines you take with your caregiver because there may be side effects or interactions.  Talk with your caregiver about your diet, exercise habits, and other lifestyle factors that may be contributing to high blood pressure.  See your caregiver regularly. Your caregiver can help you create and adjust your plan for managing high blood pressure. RECOMMENDATIONS FOR TREATMENT AND FOLLOW-UP  The following recommendations are based on current guidelines for managing high blood pressure in nonpregnant adults. Use these recommendations to identify the proper follow-up period or treatment option based on your blood pressure reading. You can discuss these options with your caregiver.  Systolic pressure of 123456 to [id number] or diastolic pressure of 80 to 89: Follow up with your caregiver as directed.  Systolic pressure of [id number] to 0000000 or diastolic pressure of 90 to 100: Follow up with your caregiver within 2 months.  Systolic pressure above 0000000 or diastolic pressure above [id number]: Follow up with your caregiver within 1 month.  Systolic pressure above [id number] or diastolic pressure above A999333: Consider  antihypertensive therapy; follow up with your caregiver within 1 week.  Systolic pressure above A999333 or diastolic pressure above 123456: Begin antihypertensive therapy; follow up with your caregiver within 1 week.   This information is not intended to replace advice given to you by your health care provider. Make sure you discuss any questions you have with your health care provider.   Document Released: 03/11/2012 Document Reviewed: 03/11/2012 Elsevier Interactive Patient Education [employer].

## 2015-09-28 NOTE — Telephone Encounter (Signed)
Patient Name: Wendy Krueger  DOB: 1930-06-18    Initial Comment Caller states her mother's BP is rising last time it was taken was yesterday and the reading was 236/102- it has been fluctuating- daughter is not with the patient at the moment   Nurse Assessment  Nurse: Mallie Mussel, RN, Alveta Heimlich Date/Time Eilene Ghazi Time): 09/28/2015 9:58:58 AM  Confirm and document reason for call. If symptomatic, describe symptoms. You must click the next button to save text entered. ---Caller states her mother's BP is rising last time it was taken was yesterday and the reading was 236/102- it has been fluctuating- daughter is not with the patient at the moment. She is currently taking Metoprolol for HTN. She doesn't know if that is the only BP med she is taking. She was only calling to let Dr. Glori Bickers know that the BP is still staying up. She had asked them to take the BP at home due to it always being up whenever she goes to the office. She states that she may need a new med or take additional medication for it. Advised her that when she gets with her mother to call us back and we can do an assessment at that time. Also advised her that I will forward this to the office and someone may be calling her back. She verbalized understanding.  Has the patient traveled out of the country within the last 30 days? ---Not Applicable  Does the patient have any new or worsening symptoms? ---Yes  Will a triage be completed? ---No  Select reason for no triage. ---Other     Guidelines    Guideline Title Affirmed Question Affirmed Notes       Final Disposition User   Clinical Call Mallie Mussel, RN, Alveta Heimlich    Comments  Per CN- use CC High BP non urg

## 2015-09-28 NOTE — Telephone Encounter (Signed)
Daughter notified of Dr. Marliss Coots comments. Daughter isn't with pt right now so she will go back to her house to make sure 1st she hasn't missed any meds if she has she will have pt take the missed dose of med, if she has not missed any meds she will take pt to ER if BP is still that high

## 2015-09-28 NOTE — Telephone Encounter (Signed)
Her current med list shows metoprolol and lisinopril for HTN  Her bp was not nearly that high here -and that was when she ran out of metoprolol Have family check to make sure she has not missed any medication  Check bp while relaxed  If it is still that high- she needs to go to the ED to get it down (that reading is extremely high)  Otherwise let us know what it is

## 2015-10-02 ENCOUNTER — Ambulatory Visit (INDEPENDENT_AMBULATORY_CARE_PROVIDER_SITE_OTHER): Payer: Medicare Other | Admitting: Family Medicine

## 2015-10-02 ENCOUNTER — Encounter: Payer: Self-pay | Admitting: Family Medicine

## 2015-10-02 VITALS — BP 160/80 | HR 60 | Temp 98.1°F | Ht 60.25 in | Wt 159.5 lb

## 2015-10-02 DIAGNOSIS — N289 Disorder of kidney and ureter, unspecified: Secondary | ICD-10-CM | POA: Diagnosis not present

## 2015-10-02 DIAGNOSIS — I10 Essential (primary) hypertension: Secondary | ICD-10-CM

## 2015-10-02 MED ORDER — LISINOPRIL 20 MG PO TABS: 20.0000 mg | ORAL_TABLET | Freq: Every day | ORAL | Status: DC

## 2015-10-02 MED ORDER — HYDROCHLOROTHIAZIDE 25 MG PO TABS: 25.0000 mg | ORAL_TABLET | Freq: Every day | ORAL | Status: DC

## 2015-10-02 NOTE — Assessment & Plan Note (Signed)
BP readings at home in 200s/90s -now improving  BP: (!) 160/80 mmHg   continue metoprolol and lisinopril 20  Add hctz 25 mg today-disc poss side eff/will update  F/u 1 mo visit and labs Brought cuff to visit-it was fairly accurate today-will continue to bring it to each visit for now

## 2015-10-02 NOTE — Progress Notes (Signed)
Pre visit review using our clinic review tool, if applicable. No additional management support is needed unless otherwise documented below in the visit note. 

## 2015-10-02 NOTE — Progress Notes (Signed)
Subjective:    Patient ID: Wendy Krueger, female    DOB: [birth year], 82 y.o.   MRN: [id number]  HPI Here for f/u of HTN   Last seen- refilled metoprolol which she was out of  Then bp went up at home  Thought she had some white coat syndrome  At home reached 205/97 Went to ED on 3/30 It was 192/99 on first check - came down to 178/98 Did not dx as hypertensive emergency- sent home with inc lisinopril 20     EKG LBBB  Results for orders placed or performed during the hospital encounter of 09/28/15  I-stat chem 8, ed  Result Value Ref Range   Sodium 141 135 - 145 mmol/L   Potassium 4.0 3.5 - 5.1 mmol/L   Chloride 103 101 - 111 mmol/L   BUN 21 (H) 6 - 20 mg/dL   Creatinine, Ser 1.10 (H) 0.44 - 1.00 mg/dL   Glucose, Bld 93 65 - 99 mg/dL   Calcium, Ion 1.18 1.13 - 1.30 mmol/L   TCO2 29 0 - 100 mmol/L   Hemoglobin 13.9 12.0 - 15.0 g/dL   HCT 41.0 36.0 - 46.0 %    Wt is up 2 lb with bmi of 30  BP Readings from Last 3 Encounters:  10/02/15 152/86  09/28/15 178/98  09/19/15 152/98    This am at home 205/96  Feels fine   No problems sleeping Is limiting salt the most she can  Pulse 60    Patient Active Problem List   Diagnosis Date Noted  . Lipoma of shoulder 03/27/2015  . Colles' fracture of right radius 09/06/2014  . Urticaria 06/20/2014  . Allergic rhinitis 06/20/2014  . Erosive gastritis 04/28/2014  . Duodenitis with bleeding 04/28/2014  . Normocytic anemia 04/28/2014  . Diastolic dysfunction [id number]  . Anemia, iron deficiency 03/25/2014  . Melena 03/21/2014  . History of GI bleed 03/21/2014  . Unspecified gastritis and gastroduodenitis without mention of hemorrhage 03/21/2014  . Near syncope 03/19/2014  . BRBPR (bright red blood per rectum) 03/19/2014  . Renal insufficiency 01/11/2014  . Dementia 09/01/2013  . Memory loss 09/01/2013  . Anxiety 09/01/2013  . Encounter for Medicare annual wellness exam 01/08/2013  . Colitis 11/11/2011  .  Gallstones 11/11/2011  . 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 Left hand weakness 06/04/2011  . Risk for falls 06/04/2011  . Constipation 03/05/2011  . Vitamin D deficiency 03/28/2009  . HYPERCHOLESTEROLEMIA, PURE 03/17/2007  . Essential hypertension 03/16/2007  . OSTEOARTHRITIS 03/16/2007  . OSTEOPOROSIS 03/16/2007   Past Medical History  Diagnosis Date  . Hypertension   . Osteoarthritis   . Osteoporosis   . Hyperlipidemia   . Colitis - presumed infectious origin     one ER visit   . Asymptomatic gallstones    Past Surgical History  Procedure Laterality Date  . Abdominal hysterectomy      BSO- fibroids  . Wrist fracture surgery  2/01    R arm  . Abi's      normal  . Left foot brace    . Dexa  1/05    osteoporosis  . Esophagogastroduodenoscopy N/A 03/21/2014    Procedure: ESOPHAGOGASTRODUODENOSCOPY (EGD);  Surgeon: Milus Banister, MD;  Location: [facility];  Service: Endoscopy;  Laterality: N/A;   Social History  Substance Use Topics  . Smoking status: Never Smoker   . Smokeless tobacco: Never Used  . Alcohol Use: No   Family History  Problem Relation Age of  Onset  . Heart attack Father   . Hypertension Father   . Heart attack Mother   . Heart attack Father   . Throat cancer Brother    Allergies  Allergen Reactions  . Alendronate Sodium     Leg pain   . Amlodipine Besylate Hives  . Calcitonin ([place]) Other (See Comments)     headache/ head pressure  . Simvastatin Other (See Comments)    Leg pain    Current Outpatient Prescriptions on File Prior to Visit  Medication Sig Dispense Refill  . aspirin 81 MG chewable tablet Chew 81 mg by mouth daily.    . Cholecalciferol (VITAMIN D-3) 1000 units CAPS Take 2 capsules by mouth daily.     Marland Kitchen donepezil (ARICEPT) 10 MG tablet Take 1 tablet (10 mg total) by mouth at bedtime. 90 tablet 3  . ferrous sulfate 325 (65 FE) MG tablet TAKE 1 TABLET (325 MG TOTAL) BY MOUTH DAILY WITH BREAKFAST. 30 tablet 5  .  Fexofenadine HCl (ALLERGY 24-HR PO) Take 1 capsule by mouth daily.    . metoprolol (LOPRESSOR) 50 MG tablet TAKE 1 TABLET (50 MG TOTAL) BY MOUTH 2 (TWO) TIMES DAILY. 180 tablet 3  . Multiple Vitamins-Minerals ([employer] SPECTRAVITE PO) Take 1 capsule by mouth daily.    Marland Kitchen omeprazole (PRILOSEC) 20 MG capsule Take 1 capsule (20 mg total) by mouth 2 (two) times daily. 180 capsule 0  . potassium chloride (KLOR-CON M10) 10 MEQ tablet Take 2 tablets (20 mEq total) by mouth daily. 180 tablet 3  . vitamin B-12 (CYANOCOBALAMIN) 1000 MCG tablet Take 1,000 mcg by mouth daily.     No current facility-administered medications on file prior to visit.    Review of Systems Review of Systems  Constitutional: Negative for fever, appetite change, fatigue and unexpected weight change.  Eyes: Negative for pain and visual disturbance.  Respiratory: Negative for cough and shortness of breath.   Cardiovascular: Negative for cp or palpitations    Gastrointestinal: Negative for nausea, diarrhea and constipation.  Genitourinary: Negative for urgency and frequency.  Skin: Negative for pallor or rash   Neurological: Negative for weakness, light-headedness, numbness and headaches. pos for generally poor balance Hematological: Negative for adenopathy. Does not bruise/bleed easily.  Psychiatric/Behavioral: Negative for dysphoric mood. The patient is not nervous/anxious.  Pos for declining short term memory       Objective:   Physical Exam  Constitutional: She appears well-developed and well-nourished. No distress.  obese and well appearing   HENT:  Head: Normocephalic and atraumatic.  Mouth/Throat: Oropharynx is clear and moist.  Eyes: Conjunctivae and EOM are normal. Pupils are equal, round, and reactive to light.  Neck: Normal range of motion. Neck supple. No JVD present. Carotid bruit is not present. No thyromegaly present.  Cardiovascular: Normal rate, regular rhythm, normal heart sounds and intact distal pulses.   Exam reveals no gallop.   Pulmonary/Chest: Effort normal and breath sounds normal. No respiratory distress. She has no wheezes. She has no rales.  No crackles  Abdominal: Soft. Bowel sounds are normal. She exhibits no distension, no abdominal bruit and no mass. There is no tenderness.  Musculoskeletal: She exhibits no edema.  Lymphadenopathy:    She has no cervical adenopathy.  Neurological: She is alert. She has normal reflexes.  Skin: Skin is warm and dry. No rash noted.  Psychiatric: She has a normal mood and affect.  Pleasant  Answers questions appropiately          Assessment &  Plan:   Problem List Items Addressed This Visit      Cardiovascular and Mediastinum   Essential hypertension - Primary    BP readings at home in 200s/90s -now improving  BP: (!) 160/80 mmHg   continue metoprolol and lisinopril 20  Add hctz 25 mg today-disc poss side eff/will update  F/u 1 mo visit and labs Brought cuff to visit-it was fairly accurate today-will continue to bring it to each visit for now        Relevant Medications   hydrochlorothiazide (HYDRODIURIL) 25 MG tablet   lisinopril (PRINIVIL,ZESTRIL) 20 MG tablet     Genitourinary   Renal insufficiency    Recent inc in lisinopril and adding hctz-so will watch this carefully Labs were ok in ED Lab Results  Component Value Date   CREATININE 1.10* 09/28/2015   BUN 21* 09/28/2015   NA 141 09/28/2015   K 4.0 09/28/2015   CL 103 09/28/2015   CO2 28 09/19/2015

## 2015-10-02 NOTE — Assessment & Plan Note (Signed)
Recent inc in lisinopril and adding hctz-so will watch this carefully Labs were ok in ED Lab Results  Component Value Date   CREATININE 1.10* 09/28/2015   BUN 21* 09/28/2015   NA 141 09/28/2015   K 4.0 09/28/2015   CL 103 09/28/2015   CO2 28 09/19/2015

## 2015-10-02 NOTE — Patient Instructions (Signed)
Keep monitoring blood pressure at home at different times (when relaxed if possible) 160/80 here in R arm and 158/85 in left - not far from your machine  Continue the current medicines  Add hctz 25 mg each morning  If any intolerable side effects let us know  Follow up with me in approx a month   Watch your sodium  Keep me posted about blood pressure

## 2015-10-30 ENCOUNTER — Other Ambulatory Visit: Payer: Self-pay | Admitting: Family Medicine

## 2015-11-02 ENCOUNTER — Other Ambulatory Visit: Payer: Self-pay

## 2015-11-02 DIAGNOSIS — Z1231 Encounter for screening mammogram for malignant neoplasm of breast: Secondary | ICD-10-CM

## 2015-11-03 ENCOUNTER — Ambulatory Visit (INDEPENDENT_AMBULATORY_CARE_PROVIDER_SITE_OTHER): Payer: Medicare Other | Admitting: Family Medicine

## 2015-11-03 ENCOUNTER — Encounter: Payer: Self-pay | Admitting: Family Medicine

## 2015-11-03 VITALS — BP 135/80 | HR 61 | Temp 98.2°F | Ht 60.25 in | Wt 156.8 lb

## 2015-11-03 DIAGNOSIS — N289 Disorder of kidney and ureter, unspecified: Secondary | ICD-10-CM | POA: Diagnosis not present

## 2015-11-03 DIAGNOSIS — I10 Essential (primary) hypertension: Secondary | ICD-10-CM | POA: Diagnosis not present

## 2015-11-03 DIAGNOSIS — Z8719 Personal history of other diseases of the digestive system: Secondary | ICD-10-CM | POA: Diagnosis not present

## 2015-11-03 LAB — BASIC METABOLIC PANEL
BUN: 20 mg/dL (ref 6–23)
CALCIUM: 10.1 mg/dL (ref 8.4–10.5)
CHLORIDE: 105 meq/L (ref 96–112)
CO2: 28 meq/L (ref 19–32)
CREATININE: 1.08 mg/dL (ref 0.40–1.20)
GFR: 51.37 mL/min — ABNORMAL LOW (ref >60.00)
Glucose, Bld: 100 mg/dL — ABNORMAL HIGH (ref 70–99)
Potassium: 3.8 mEq/L (ref 3.5–5.1)
SODIUM: 140 meq/L (ref 135–145)

## 2015-11-03 MED ORDER — HYDROCHLOROTHIAZIDE 25 MG PO TABS: 25.0000 mg | ORAL_TABLET | Freq: Every day | ORAL | Status: DC

## 2015-11-03 MED ORDER — OMEPRAZOLE 20 MG PO CPDR: 20.0000 mg | DELAYED_RELEASE_CAPSULE | Freq: Two times a day (BID) | ORAL | Status: DC

## 2015-11-03 MED ORDER — LISINOPRIL 20 MG PO TABS: 20.0000 mg | ORAL_TABLET | Freq: Every day | ORAL | Status: DC

## 2015-11-03 NOTE — Assessment & Plan Note (Signed)
Pt continues bid PPI No symptoms and cbc normalized last check (no longer needs iron)

## 2015-11-03 NOTE — Assessment & Plan Note (Signed)
Added hctz and on lisinopril currently-hope renal labs are stable Working to control bp Lab today  Enc water intake Will continue to monitor

## 2015-11-03 NOTE — Progress Notes (Signed)
Pre visit review using our clinic review tool, if applicable. No additional management support is needed unless otherwise documented below in the visit note. 

## 2015-11-03 NOTE — Assessment & Plan Note (Signed)
Much improved with hctz addition BP Readings from Last 3 Encounters:  11/03/15 135/80  10/02/15 160/80  09/28/15 178/98   Disc lifestyle habits (tough to change as pt has some dementia) Enc DASH diet/stay active/drink water Lab today  F/u July if nl Disc way to take bp at home - technique change may change reading

## 2015-11-03 NOTE — Patient Instructions (Signed)
Blood pressure is improved at 135/80  Always check with relaxed arm at heart level and 2 feet on the floor Always check when relaxed in general  Try to eat a healthy diet and stay active Labs today

## 2015-11-03 NOTE — Progress Notes (Signed)
Subjective:    Patient ID: MARGURET SLINGSBY, female    DOB: [birth year], 82 y.o.   MRN: [id number]  HPI Here for f/u of HTN Last visit we added HCTZ  BP Readings from Last 3 Encounters:  11/03/15 146/78  10/02/15 160/80  09/28/15 178/98  making some good progress  Higher at home (may be calmer here- has the opposite of white coat)- gets irritable at home  No side effects  No headaches No swelling of ankles  Eating what she wants- does not watch her diet   Due for labs  Has hx of renal insuff- watching closely on hctz  Hx of remote GI bleed Took iron Lab Results  Component Value Date   WBC 5.0 09/19/2015   HGB 13.9 09/28/2015   HCT 41.0 09/28/2015   MCV 89.0 09/19/2015   PLT 197.0 09/19/2015   no stool changes or blood in stool   Patient Active Problem List   Diagnosis Date Noted  . Lipoma of shoulder 03/27/2015  . Colles' fracture of right radius 09/06/2014  . Urticaria 06/20/2014  . Allergic rhinitis 06/20/2014  . Erosive gastritis 04/28/2014  . Duodenitis with bleeding 04/28/2014  . Normocytic anemia 04/28/2014  . Diastolic dysfunction [id number]  . Anemia, iron deficiency 03/25/2014  . Melena 03/21/2014  . History of GI bleed 03/21/2014  . Unspecified gastritis and gastroduodenitis without mention of hemorrhage 03/21/2014  . Near syncope 03/19/2014  . BRBPR (bright red blood per rectum) 03/19/2014  . Renal insufficiency 01/11/2014  . Dementia 09/01/2013  . Memory loss 09/01/2013  . Anxiety 09/01/2013  . Encounter for Medicare annual wellness exam 01/08/2013  . Colitis 11/11/2011  . Gallstones 11/11/2011  . 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 Left hand weakness 06/04/2011  . Risk for falls 06/04/2011  . Constipation 03/05/2011  . Vitamin D deficiency 03/28/2009  . HYPERCHOLESTEROLEMIA, PURE 03/17/2007  . Essential hypertension 03/16/2007  . OSTEOARTHRITIS 03/16/2007  . OSTEOPOROSIS 03/16/2007   Past Medical History  Diagnosis Date  .  Hypertension   . Osteoarthritis   . Osteoporosis   . Hyperlipidemia   . Colitis - presumed infectious origin     one ER visit   . Asymptomatic gallstones    Past Surgical History  Procedure Laterality Date  . Abdominal hysterectomy      BSO- fibroids  . Wrist fracture surgery  2/01    R arm  . Abi's      normal  . Left foot brace    . Dexa  1/05    osteoporosis  . Esophagogastroduodenoscopy N/A 03/21/2014    Procedure: ESOPHAGOGASTRODUODENOSCOPY (EGD);  Surgeon: Milus Banister, MD;  Location: [facility];  Service: Endoscopy;  Laterality: N/A;   Social History  Substance Use Topics  . Smoking status: Never Smoker   . Smokeless tobacco: Never Used  . Alcohol Use: No   Family History  Problem Relation Age of Onset  . Heart attack Father   . Hypertension Father   . Heart attack Mother   . Heart attack Father   . Throat cancer Brother    Allergies  Allergen Reactions  . Alendronate Sodium     Leg pain   . Amlodipine Besylate Hives  . Calcitonin ([place]) Other (See Comments)     headache/ head pressure  . Simvastatin Other (See Comments)    Leg pain    Current Outpatient Prescriptions on File Prior to Visit  Medication Sig Dispense Refill  . aspirin 81 MG chewable tablet  Chew 81 mg by mouth daily.    . Cholecalciferol (VITAMIN D-3) 1000 units CAPS Take 2 capsules by mouth daily.     Marland Kitchen donepezil (ARICEPT) 10 MG tablet Take 1 tablet (10 mg total) by mouth at bedtime. 90 tablet 3  . Fexofenadine HCl (ALLERGY 24-HR PO) Take 1 capsule by mouth daily.    . metoprolol (LOPRESSOR) 50 MG tablet TAKE 1 TABLET (50 MG TOTAL) BY MOUTH 2 (TWO) TIMES DAILY. 180 tablet 3  . Multiple Vitamins-Minerals ([employer] SPECTRAVITE PO) Take 1 capsule by mouth daily.    . potassium chloride (KLOR-CON M10) 10 MEQ tablet Take 2 tablets (20 mEq total) by mouth daily. 180 tablet 3  . vitamin B-12 (CYANOCOBALAMIN) 1000 MCG tablet Take 1,000 mcg by mouth daily.     No current facility-administered  medications on file prior to visit.    Review of Systems Review of Systems  Constitutional: Negative for fever, appetite change, fatigue and unexpected weight change.  Eyes: Negative for pain and visual disturbance.  Respiratory: Negative for cough and shortness of breath.   Cardiovascular: Negative for cp or palpitations    Gastrointestinal: Negative for nausea, diarrhea and constipation.  Genitourinary: Negative for urgency and frequency.  Skin: Negative for pallor or rash   Neurological: Negative for weakness, light-headedness, numbness and headaches.  Hematological: Negative for adenopathy. Does not bruise/bleed easily.  Psychiatric/Behavioral: Negative for dysphoric mood. The patient is anxious/agitated at times with some dementia        Objective:   Physical Exam  Constitutional: She appears well-developed and well-nourished. No distress.  HENT:  Head: Normocephalic and atraumatic.  Mouth/Throat: Oropharynx is clear and moist.  Eyes: Conjunctivae and EOM are normal. Pupils are equal, round, and reactive to light.  Neck: Normal range of motion. Neck supple. No JVD present. Carotid bruit is not present. No thyromegaly present.  Cardiovascular: Normal rate, regular rhythm, normal heart sounds and intact distal pulses.  Exam reveals no gallop.   Pulmonary/Chest: Effort normal and breath sounds normal. No respiratory distress. She has no wheezes. She has no rales.  No crackles  Abdominal: Soft. Bowel sounds are normal. She exhibits no distension, no abdominal bruit and no mass. There is no tenderness.  Musculoskeletal: She exhibits no edema.  Lymphadenopathy:    She has no cervical adenopathy.  Neurological: She is alert. She has normal reflexes.  Skin: Skin is warm and dry. No rash noted. No pallor.  Psychiatric: She has a normal mood and affect.  Baseline-pleasant with poor short term memory  Not agitated today          Assessment & Plan:   Problem List Items Addressed  This Visit      Cardiovascular and Mediastinum   Essential hypertension - Primary    Much improved with hctz addition BP Readings from Last 3 Encounters:  11/03/15 135/80  10/02/15 160/80  09/28/15 178/98   Disc lifestyle habits (tough to change as pt has some dementia) Enc DASH diet/stay active/drink water Lab today  F/u July if nl Disc way to take bp at home - technique change may change reading      Relevant Medications   lisinopril (PRINIVIL,ZESTRIL) 20 MG tablet   hydrochlorothiazide (HYDRODIURIL) 25 MG tablet   Other Relevant Orders   Basic metabolic panel     Genitourinary   Renal insufficiency    Added hctz and on lisinopril currently-hope renal labs are stable Working to control bp Lab today  Enc water intake Will continue to  monitor       Relevant Orders   Basic metabolic panel     Other   History of GI bleed    Pt continues bid PPI No symptoms and cbc normalized last check (no longer needs iron)

## 2015-11-06 ENCOUNTER — Encounter: Payer: Self-pay | Admitting: *Deleted

## 2015-11-14 ENCOUNTER — Ambulatory Visit
Admission: RE | Admit: 2015-11-14 | Discharge: 2015-11-14 | Disposition: A | Discharge status: Home / Self Care | Payer: Medicare Other | Source: Ambulatory Visit

## 2015-11-14 DIAGNOSIS — Z1231 Encounter for screening mammogram for malignant neoplasm of breast: Secondary | ICD-10-CM | POA: Diagnosis not present

## 2015-11-16 ENCOUNTER — Other Ambulatory Visit: Payer: Self-pay | Admitting: Family Medicine

## 2015-11-16 DIAGNOSIS — R928 Other abnormal and inconclusive findings on diagnostic imaging of breast: Secondary | ICD-10-CM

## 2015-11-23 ENCOUNTER — Ambulatory Visit
Admission: RE | Admit: 2015-11-23 | Discharge: 2015-11-23 | Disposition: A | Discharge status: Home / Self Care | Payer: Medicare Other | Source: Ambulatory Visit | Attending: Family Medicine | Admitting: Family Medicine

## 2015-11-23 DIAGNOSIS — R928 Other abnormal and inconclusive findings on diagnostic imaging of breast: Secondary | ICD-10-CM

## 2015-11-23 LAB — HM MAMMOGRAPHY

## 2015-12-02 DIAGNOSIS — H2513 Age-related nuclear cataract, bilateral: Secondary | ICD-10-CM | POA: Diagnosis not present

## 2016-01-03 ENCOUNTER — Encounter: Payer: Self-pay | Admitting: Family Medicine

## 2016-01-03 ENCOUNTER — Ambulatory Visit (INDEPENDENT_AMBULATORY_CARE_PROVIDER_SITE_OTHER): Payer: Medicare Other | Admitting: Family Medicine

## 2016-01-03 ENCOUNTER — Ambulatory Visit (INDEPENDENT_AMBULATORY_CARE_PROVIDER_SITE_OTHER): Payer: Medicare Other

## 2016-01-03 VITALS — BP 132/80 | HR 58 | Temp 97.8°F | Ht 60.0 in | Wt 159.0 lb

## 2016-01-03 DIAGNOSIS — Z0000 Encounter for general adult medical examination without abnormal findings: Secondary | ICD-10-CM

## 2016-01-03 DIAGNOSIS — M810 Age-related osteoporosis without current pathological fracture: Secondary | ICD-10-CM

## 2016-01-03 DIAGNOSIS — E559 Vitamin D deficiency, unspecified: Secondary | ICD-10-CM

## 2016-01-03 DIAGNOSIS — F0390 Unspecified dementia without behavioral disturbance: Secondary | ICD-10-CM

## 2016-01-03 DIAGNOSIS — E7800 Pure hypercholesterolemia, unspecified: Secondary | ICD-10-CM | POA: Diagnosis not present

## 2016-01-03 DIAGNOSIS — I10 Essential (primary) hypertension: Secondary | ICD-10-CM | POA: Diagnosis not present

## 2016-01-03 MED ORDER — DONEPEZIL HCL 23 MG PO TABS
23.0000 mg | ORAL_TABLET | Freq: Every day | ORAL | Status: DC
Start: 2016-01-03 — End: 2016-06-06

## 2016-01-03 NOTE — Patient Instructions (Signed)
Watch out falls / safety  Increase aricept to 23 mg once daily- if side effects or problems please let me know  Get a flu shot in the fall  Labs are stable  Consider getting cataracts taken care of -it may improve quality of life and safety

## 2016-01-03 NOTE — Assessment & Plan Note (Signed)
Better control with less sodium in her diet  bp in fair control at this time  BP Readings from Last 1 Encounters:  01/03/16 132/80   No changes needed Disc lifstyle change with low sodium diet and exercise

## 2016-01-03 NOTE — Assessment & Plan Note (Signed)
Last dexa was 2010 and pt declines another Hx of past radial fx -none since  D level is 38  No recent falls  Declines tx at this time due to age / counseled on safety and fall prev  Disc need for calcium/ vitamin D/ wt bearing exercise and bone density test every 2 y to monitor Disc safety/ fracture risk in detail

## 2016-01-03 NOTE — Assessment & Plan Note (Signed)
Gradually worsening with time  Will inc her aricept from 10 to 23 mg qhs - if side eff (rev) like nausea or other- family will alert me Safety discussed  (also hearing and vision)- would like her to have cataracts operated on but pt refuses  Family will continue to help and watch her closely

## 2016-01-03 NOTE — Progress Notes (Signed)
Pre visit review using our clinic review tool, if applicable. No additional management support is needed unless otherwise documented below in the visit note. 

## 2016-01-03 NOTE — Progress Notes (Signed)
Subjective:   Wendy Krueger is a 82 y.o. female who presents for Medicare Annual (Subsequent) preventive examination.  Review of Systems:  N/A Cardiac Risk Factors include: advanced age (>70men, >55 women);hypertension;obesity (BMI >30kg/m2);sedentary lifestyle     Objective:     Vitals: BP 132/80 mmHg  Pulse 58  Temp(Src) 97.8 F (36.6 C) (Oral)  Ht 5' (1.524 m)  Wt 159 lb (72.122 kg)  BMI 31.05 kg/m2  SpO2 96%  Body mass index is 31.05 kg/(m^2).   Tobacco History  Smoking status  . Never Smoker   Smokeless tobacco  . Never Used     Counseling given: No   Past Medical History  Diagnosis Date  . Hypertension   . Osteoarthritis   . Osteoporosis   . Hyperlipidemia   . Colitis - presumed infectious origin     one ER visit   . Asymptomatic gallstones    Past Surgical History  Procedure Laterality Date  . Abdominal hysterectomy      BSO- fibroids  . Wrist fracture surgery  2/01    R arm  . Abi's      normal  . Left foot brace    . Dexa  1/05    osteoporosis  . Esophagogastroduodenoscopy N/A 03/21/2014    Procedure: ESOPHAGOGASTRODUODENOSCOPY (EGD);  Surgeon: Milus Banister, MD;  Location: [facility];  Service: Endoscopy;  Laterality: N/A;   Family History  Problem Relation Age of Onset  . Heart attack Father   . Hypertension Father   . Heart attack Mother   . Heart attack Father   . Throat cancer Brother    History  Sexual Activity  . Sexual Activity: No    Outpatient Encounter Prescriptions as of 01/03/2016  Medication Sig  . aspirin 81 MG chewable tablet Chew 81 mg by mouth daily.  . Cholecalciferol (VITAMIN D-3) 1000 units CAPS Take 2 capsules by mouth daily.   Marland Kitchen donepezil (ARICEPT) 10 MG tablet Take 1 tablet (10 mg total) by mouth at bedtime.  Marland Kitchen Fexofenadine HCl (ALLERGY 24-HR PO) Take 1 capsule by mouth daily.  . hydrochlorothiazide (HYDRODIURIL) 25 MG tablet Take 1 tablet (25 mg total) by mouth daily.  Marland Kitchen lisinopril (PRINIVIL,ZESTRIL)  20 MG tablet Take 1 tablet (20 mg total) by mouth daily.  . metoprolol (LOPRESSOR) 50 MG tablet TAKE 1 TABLET (50 MG TOTAL) BY MOUTH 2 (TWO) TIMES DAILY.  . Multiple Vitamins-Minerals ([employer] SPECTRAVITE PO) Take 1 capsule by mouth daily.  Marland Kitchen omeprazole (PRILOSEC) 20 MG capsule Take 1 capsule (20 mg total) by mouth 2 (two) times daily.  . potassium chloride (KLOR-CON M10) 10 MEQ tablet Take 2 tablets (20 mEq total) by mouth daily.  . vitamin B-12 (CYANOCOBALAMIN) 1000 MCG tablet Take 1,000 mcg by mouth daily.   No facility-administered encounter medications on file as of 01/03/2016.    Activities of Daily Living In your present state of health, do you have any difficulty performing the following activities: 01/03/2016  Hearing? N  Vision? N  Difficulty concentrating or making decisions? Y  Walking or climbing stairs? Y  Dressing or bathing? N  Doing errands, shopping? Y  Preparing Food and eating ? N  Using the Toilet? N  In the past six months, have you accidently leaked urine? N  Do you have problems with loss of bowel control? N  Managing your Medications? Y  Managing your Finances? Y  Housekeeping or managing your Housekeeping? N    Patient Care Team: [employer],  MD as PCP - General Shirl Harris, OD as Referring Physician (Optometry)    Assessment:     Hearing Screening   125Hz  250Hz  500Hz  1000Hz  2000Hz  4000Hz  8000Hz   Right ear:   0 0 40 0   Left ear:   0 0 40 0   Vision Screening Comments: Last vision exam in June 2017   Exercise Activities and Dietary recommendations Current Exercise Habits: The patient does not participate in regular exercise at present, Exercise limited by: None identified  Goals    . Reduce sodium intake     Starting 01/03/2016, I will continue to monitor intake of sodium in diet. Daily intake should be less than 1500 mg.       Fall Risk Fall Risk  01/03/2016 01/03/2016 01/11/2014 01/08/2013 07/10/2012  Falls in the past year? No No Yes No No  Number  falls in past yr: - - 1 - -  Injury with Fall? - - No - -   Depression Screen PHQ 2/9 Scores 01/03/2016 01/03/2016 01/11/2014 01/08/2013  PHQ - 2 Score 0 0 0 0     Cognitive Testing MMSE - Mini Mental State Exam 01/03/2016  Not completed: (No Data)  dx: unspecified dementia  Immunization History  Administered Date(s) Administered  . Influenza Split 06/04/2011, 07/10/2012  . Influenza,inj,Quad PF,36+ Mos 07/12/2014  . Pneumococcal Conjugate-13 01/11/2014  . Pneumococcal Polysaccharide-23 06/04/2011  . Td 08/27/2004   Screening Tests Health Maintenance  Topic Date Due  . TETANUS/TDAP  01/03/2016 (Originally 08/27/2014)  . ZOSTAVAX  12/30/2018 (Originally 10/24/1991)  . INFLUENZA VACCINE  01/30/2016  . MAMMOGRAM  11/22/2016  . DEXA SCAN  Completed  . PNA vac Low Risk Adult  Completed      Plan:     I have personally reviewed and addressed the Medicare Annual Wellness questionnaire and have noted the following in the patient's chart:  A. Medical and social history B. Use of alcohol, tobacco or illicit drugs  C. Current medications and supplements D. Functional ability and status E.  Nutritional status F.  Physical activity G. Advance directives H. List of other physicians I.  Hospitalizations, surgeries, and ER visits in previous 12 months J.  [place] to include hearing, vision, cognitive, depression L. Referrals and appointments - none  In addition, I have reviewed and discussed with patient certain preventive protocols, quality metrics, and best practice recommendations. A written personalized care plan for preventive services as well as general preventive health recommendations were provided to patient.  See attached scanned questionnaire for additional information.   Signed,   Lindell Noe, MHA, BS, LPN Health Advisor

## 2016-01-03 NOTE — Progress Notes (Signed)
Subjective:    Patient ID: Wendy Krueger, female    DOB: 03-Feb-1930, 82 y.o.   MRN: [id number]  HPI Here for annual f/u of acute and chronic medical problems   Pt was also seen for AMW today  Postponed tetanus shot due to insurance  Failed hearing screen - she is aware- declines further eval or hearing aides   She has had a hysterectomy in the past   Zoster vaccine -not interested in   Had her flu shot in 1/16  Mammogram 5/17- recall and it was ok  Self breast exam- no lumps or changes   dexa 11/10 Pt has osteoporosis and declines further dexa  D level is 38- takes ca and D  fx of radius in the past (none since)   PNA completed both   Wt is up 3 lb with bmi of 31 In obese range  Is eating well/ good appetite   bp is stable today  No cp or palpitations or headaches or edema  No side effects to medicines  BP Readings from Last 3 Encounters:  01/03/16 132/80  01/03/16 132/80  11/03/15 135/80    Doing well with that  Took away the salt shaker-has made a difference     Chemistry      Component Value Date/Time   NA 140 11/03/2015 1137   K 3.8 11/03/2015 1137   CL 105 11/03/2015 1137   CO2 28 11/03/2015 1137   BUN 20 11/03/2015 1137   CREATININE 1.08 11/03/2015 1137      Component Value Date/Time   CALCIUM 10.1 11/03/2015 1137   ALKPHOS 61 09/19/2015 1202   AST 17 09/19/2015 1202   ALT 10 09/19/2015 1202   BILITOT 0.4 09/19/2015 1202     hx of cva in the past  Hx of renal insuff in the past  Drinks some water-not enough   Hx of dementia on aricept  Still notes a continued decline in short term memory  Family has no safety concerns- she does not drive or cook  She will answer the phone  Still folding clothes/sweeps - regular household tasks    Hx of anemia in the past -resolved in 3/17 GI bleed in the past-no symptoms now at all except for occ brb from hemorrhoids  Has occ loose bm-not often  Lab Results  Component Value Date   WBC 5.0  09/19/2015   HGB 13.9 09/28/2015   HCT 41.0 09/28/2015   MCV 89.0 09/19/2015   PLT 197.0 09/19/2015     Hx of hyperlipidemia Lab Results  Component Value Date   CHOL 303* 09/19/2015   HDL 53.70 09/19/2015   LDLCALC 199* 01/03/2014   LDLDIRECT 182.0 09/19/2015   TRIG 253.0* 09/19/2015   CHOLHDL 6 09/19/2015   has declined tx due to age  Does watch her diet   Patient Active Problem List   Diagnosis Date Noted  . Lipoma of shoulder 03/27/2015  . History of closed Colles' fracture 09/06/2014  . Allergic rhinitis 06/20/2014  . Erosive gastritis 04/28/2014  . Duodenitis with bleeding 04/28/2014  . Normocytic anemia 04/28/2014  . Diastolic dysfunction [id number]  . Melena 03/21/2014  . History of GI bleed 03/21/2014  . Unspecified gastritis and gastroduodenitis without mention of hemorrhage 03/21/2014  . Near syncope 03/19/2014  . Dementia 09/01/2013  . Memory loss 09/01/2013  . Anxiety 09/01/2013  . Encounter for Medicare annual wellness exam 01/08/2013  . Gallstones 11/11/2011  . 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  Left hand weakness 06/04/2011  . Risk for falls 06/04/2011  . Constipation 03/05/2011  . Vitamin D deficiency 03/28/2009  . HYPERCHOLESTEROLEMIA, PURE 03/17/2007  . Essential hypertension 03/16/2007  . OSTEOARTHRITIS 03/16/2007  . Osteoporosis 03/16/2007   Past Medical History  Diagnosis Date  . Hypertension   . Osteoarthritis   . Osteoporosis   . Hyperlipidemia   . Colitis - presumed infectious origin     one ER visit   . Asymptomatic gallstones    Past Surgical History  Procedure Laterality Date  . Abdominal hysterectomy      BSO- fibroids  . Wrist fracture surgery  2/01    R arm  . Abi's      normal  . Left foot brace    . Dexa  1/05    osteoporosis  . Esophagogastroduodenoscopy N/A 03/21/2014    Procedure: ESOPHAGOGASTRODUODENOSCOPY (EGD);  Surgeon: Milus Banister, MD;  Location: [place];  Service: Endoscopy;  Laterality:  N/A;   Social History  Substance Use Topics  . Smoking status: Never Smoker   . Smokeless tobacco: Never Used  . Alcohol Use: No   Family History  Problem Relation Age of Onset  . Heart attack Father   . Hypertension Father   . Heart attack Mother   . Heart attack Father   . Throat cancer Brother    Allergies  Allergen Reactions  . Alendronate Sodium     Leg pain   . Amlodipine Besylate Hives  . Calcitonin ([place]) Other (See Comments)     headache/ head pressure  . Simvastatin Other (See Comments)    Leg pain    Current Outpatient Prescriptions on File Prior to Visit  Medication Sig Dispense Refill  . aspirin 81 MG chewable tablet Chew 81 mg by mouth daily.    . Cholecalciferol (VITAMIN D-3) 1000 units CAPS Take 2 capsules by mouth daily.     Marland Kitchen Fexofenadine HCl (ALLERGY 24-HR PO) Take 1 capsule by mouth daily.    . hydrochlorothiazide (HYDRODIURIL) 25 MG tablet Take 1 tablet (25 mg total) by mouth daily. 90 tablet 3  . lisinopril (PRINIVIL,ZESTRIL) 20 MG tablet Take 1 tablet (20 mg total) by mouth daily. 90 tablet 3  . metoprolol (LOPRESSOR) 50 MG tablet TAKE 1 TABLET (50 MG TOTAL) BY MOUTH 2 (TWO) TIMES DAILY. 180 tablet 3  . Multiple Vitamins-Minerals ([employer] SPECTRAVITE PO) Take 1 capsule by mouth daily.    Marland Kitchen omeprazole (PRILOSEC) 20 MG capsule Take 1 capsule (20 mg total) by mouth 2 (two) times daily. 180 capsule 3  . potassium chloride (KLOR-CON M10) 10 MEQ tablet Take 2 tablets (20 mEq total) by mouth daily. 180 tablet 3  . vitamin B-12 (CYANOCOBALAMIN) 1000 MCG tablet Take 1,000 mcg by mouth daily.     No current facility-administered medications on file prior to visit.     Review of Systems Review of Systems  Constitutional: Negative for fever, appetite change,  and unexpected weight change. pos for occ fatigue  Eyes: Negative for pain and pos for cataracts interfering with vision  Respiratory: Negative for cough and shortness of breath.   Cardiovascular: Negative  for cp or palpitations    Gastrointestinal: Negative for nausea, diarrhea and constipation. pos for occ hemorrhoidal bleeding  Genitourinary: Negative for urgency and frequency.  Skin: Negative for pallor or rash   Neurological: Negative for weakness, light-headedness, numbness and headaches.  Hematological: Negative for adenopathy. Does not bruise/bleed easily.  Psychiatric/Behavioral: Negative for dysphoric mood. The patient is  occ nervous/anxious.  pos for dementia with short term memory loss/ occ confusion at night        Objective:   Physical Exam  Constitutional: She appears well-developed and well-nourished. No distress.  obese and well appearing   HENT:  Head: Normocephalic and atraumatic.  Right Ear: External ear normal.  Left Ear: External ear normal.  Mouth/Throat: Oropharynx is clear and moist.  Scant cerumen bilat  Eyes: Conjunctivae and EOM are normal. Pupils are equal, round, and reactive to light. No scleral icterus.  Neck: Normal range of motion. Neck supple. No JVD present. Carotid bruit is not present. No thyromegaly present.  Cardiovascular: Normal rate, regular rhythm, normal heart sounds and intact distal pulses.  Exam reveals no gallop.   Pulmonary/Chest: Effort normal and breath sounds normal. No respiratory distress. She has no wheezes. She exhibits no tenderness.  Abdominal: Soft. Bowel sounds are normal. She exhibits no distension, no abdominal bruit and no mass. There is no tenderness.  Genitourinary: No breast swelling, tenderness, discharge or bleeding.  Breast exam: No mass, nodules, thickening, tenderness, bulging, retraction, inflamation, nipple discharge or skin changes noted.  No axillary or clavicular LA.      Musculoskeletal: Normal range of motion. She exhibits no edema or tenderness.  Lymphadenopathy:    She has no cervical adenopathy.  Neurological: She is alert. She has normal reflexes. No cranial nerve deficit. She exhibits normal muscle tone.  Coordination normal.  Skin: Skin is warm and dry. No rash noted. No erythema. No pallor.  Skin tag on L back is unchanged   Psychiatric: She has a normal mood and affect.  Talkative/ cheerful  Does repeat herself  May confabulate a bit also          Assessment & Plan:   Problem List Items Addressed This Visit      Cardiovascular and Mediastinum   Essential hypertension - Primary    Better control with less sodium in her diet  bp in fair control at this time  BP Readings from Last 1 Encounters:  01/03/16 132/80   No changes needed Disc lifstyle change with low sodium diet and exercise           Nervous and Auditory   Dementia    Gradually worsening with time  Will inc her aricept from 10 to 23 mg qhs - if side eff (rev) like nausea or other- family will alert me Safety discussed  (also hearing and vision)- would like her to have cataracts operated on but pt refuses  Family will continue to help and watch her closely       Relevant Medications   donepezil (ARICEPT) 23 MG TABS tablet     Musculoskeletal and Integument   Osteoporosis    Last dexa was 2010 and pt declines another Hx of past radial fx -none since  D level is 38  No recent falls  Declines tx at this time due to age / counseled on safety and fall prev  Disc need for calcium/ vitamin D/ wt bearing exercise and bone density test every 2 y to monitor Disc safety/ fracture risk in detail          Other   Vitamin D deficiency    Level is 65 with current supplementation  Vitamin D level is therapeutic with current supplementation Disc importance of this to bone and overall health  Enc outdoor time      HYPERCHOLESTEROLEMIA, PURE    Rev last labs- high LDL  At her age-pt declines treatment for this Disc goals for lipids and reasons to control them Rev labs with pt Rev low sat fat diet in detail

## 2016-01-03 NOTE — Assessment & Plan Note (Signed)
Rev last labs- high LDL  At her age-pt declines treatment for this Disc goals for lipids and reasons to control them Rev labs with pt Rev low sat fat diet in detail

## 2016-01-03 NOTE — Progress Notes (Signed)
PCP notes:  Health maintenance:   Tetanus - postponed/insurance  Abnormal screenings:   Hearing-failed  Patient concerns: None  Nurse concerns: None  Next PCP appt: 01/03/2016 @ 1200  I reviewed health advisor's note, was available for consultation, and agree with documentation and plan Loura Pardon MD

## 2016-01-03 NOTE — Assessment & Plan Note (Signed)
Level is 38 with current supplementation  Vitamin D level is therapeutic with current supplementation Disc importance of this to bone and overall health  Enc outdoor time

## 2016-01-03 NOTE — Patient Instructions (Signed)
Wendy Krueger , Thank you for taking time to come for your Medicare Wellness Visit. I appreciate your ongoing commitment to your health goals. Please review the following plan we discussed and let me know if I can assist you in the future.   These are the goals we discussed: Goals    . Reduce sodium intake     Starting 01/03/2016, I will continue to monitor intake of sodium in diet. Daily intake should be less than 1500 mg.        This is a list of the screening recommended for you and due dates:  Health Maintenance  Topic Date Due  . Tetanus Vaccine  01/03/2016*  . Shingles Vaccine  12/30/2018*  . Flu Shot  01/30/2016  . Mammogram  11/22/2016  . DEXA scan (bone density measurement)  Completed  . Pneumonia vaccines  Completed  *Topic was postponed. The date shown is not the original due date.    Preventive Care for Adults  A healthy lifestyle and preventive care can promote health and wellness. Preventive health guidelines for adults include the following key practices.  . A routine yearly physical is a good way to check with your health care provider about your health and preventive screening. It is a chance to share any concerns and updates on your health and to receive a thorough exam.  . Visit your dentist for a routine exam and preventive care every 6 months. Brush your teeth twice a day and floss once a day. Good oral hygiene prevents tooth decay and gum disease.  . The frequency of eye exams is based on your age, health, family medical history, use  of contact lenses, and other factors. Follow your health care provider's ecommendations for frequency of eye exams.  . Eat a healthy diet. Foods like vegetables, fruits, whole grains, low-fat dairy products, and lean protein foods contain the nutrients you need without too many calories. Decrease your intake of foods high in solid fats, added sugars, and salt. Eat the right amount of calories for you. Get information about a proper  diet from your health care provider, if necessary.  . Regular physical exercise is one of the most important things you can do for your health. Most adults should get at least 150 minutes of moderate-intensity exercise (any activity that increases your heart rate and causes you to sweat) each week. In addition, most adults need muscle-strengthening exercises on 2 or more days a week.  Silver Sneakers may be a benefit available to you. To determine eligibility, you may visit the website: www.silversneakers.com or contact program at 3036538985 Mon-Fri between 8AM-8PM.   . Maintain a healthy weight. The body mass index (BMI) is a screening tool to identify possible weight problems. It provides an estimate of body fat based on height and weight. Your health care provider can find your BMI and can help you achieve or maintain a healthy weight.   For adults 20 years and older: ? A BMI below 18.5 is considered underweight. ? A BMI of 18.5 to 24.9 is normal. ? A BMI of 25 to 29.9 is considered overweight. ? A BMI of 30 and above is considered obese.   . Maintain normal blood lipids and cholesterol levels by exercising and minimizing your intake of saturated fat. Eat a balanced diet with plenty of fruit and vegetables. Blood tests for lipids and cholesterol should begin at age 53 and be repeated every 5 years. If your lipid or cholesterol levels are high, you  are over 35, or you are at high risk for heart disease, you may need your cholesterol levels checked more frequently. Ongoing high lipid and cholesterol levels should be treated with medicines if diet and exercise are not working.  . If you smoke, find out from your health care provider how to quit. If you do not use tobacco, please do not start.  . If you choose to drink alcohol, please do not consume more than 2 drinks per day. One drink is considered to be 12 ounces (355 mL) of beer, 5 ounces (148 mL) of wine, or 1.5 ounces (44 mL) of  liquor.  . If you are 23-27 years old, ask your health care provider if you should take aspirin to prevent strokes.  . Use sunscreen. Apply sunscreen liberally and repeatedly throughout the day. You should seek shade when your shadow is shorter than you. Protect yourself by wearing long sleeves, pants, a wide-brimmed hat, and sunglasses year round, whenever you are outdoors.  . Once a month, do a whole body skin exam, using a mirror to look at the skin on your back. Tell your health care provider of new moles, moles that have irregular borders, moles that are larger than a pencil eraser, or moles that have changed in shape or color.

## 2016-01-31 ENCOUNTER — Encounter: Payer: Medicare Other | Admitting: Family Medicine

## 2016-02-08 DIAGNOSIS — R402411 Glasgow coma scale score 13-15, in the field [EMT or ambulance]: Secondary | ICD-10-CM | POA: Diagnosis not present

## 2016-02-08 DIAGNOSIS — N390 Urinary tract infection, site not specified: Secondary | ICD-10-CM | POA: Diagnosis not present

## 2016-02-08 DIAGNOSIS — R55 Syncope and collapse: Secondary | ICD-10-CM | POA: Diagnosis not present

## 2016-02-08 DIAGNOSIS — Z888 Allergy status to other drugs, medicaments and biological substances status: Secondary | ICD-10-CM | POA: Diagnosis not present

## 2016-02-08 DIAGNOSIS — Z9079 Acquired absence of other genital organ(s): Secondary | ICD-10-CM | POA: Diagnosis not present

## 2016-02-13 ENCOUNTER — Encounter: Payer: Self-pay | Admitting: Family Medicine

## 2016-02-13 ENCOUNTER — Ambulatory Visit (INDEPENDENT_AMBULATORY_CARE_PROVIDER_SITE_OTHER): Payer: Medicare Other | Admitting: Family Medicine

## 2016-02-13 VITALS — BP 120/70 | HR 62 | Temp 98.0°F | Ht 60.0 in | Wt 160.8 lb

## 2016-02-13 DIAGNOSIS — R55 Syncope and collapse: Secondary | ICD-10-CM | POA: Diagnosis not present

## 2016-02-13 DIAGNOSIS — I10 Essential (primary) hypertension: Secondary | ICD-10-CM | POA: Diagnosis not present

## 2016-02-13 DIAGNOSIS — N3000 Acute cystitis without hematuria: Secondary | ICD-10-CM

## 2016-02-13 DIAGNOSIS — N390 Urinary tract infection, site not specified: Secondary | ICD-10-CM | POA: Insufficient documentation

## 2016-02-13 NOTE — Progress Notes (Signed)
Pre visit review using our clinic review tool, if applicable. No additional management support is needed unless otherwise documented below in the visit note. 

## 2016-02-13 NOTE — Progress Notes (Signed)
Subjective:    Patient ID: RAYE ERICKSEN, female    DOB: [birth year], 82 y.o.   MRN: [id number]  HPI Here for f/u of ED visit - at [facility] brought by pt family today -all paper/have not been scanned yet   02/08/16 Was sitting outside in the shade Got pale Then fell backwards and "eyes rolled back" - not responsive - for about 5 minutes  ? If heat stroke  Was confused several days before   EMS came and put 02 on her  Transported to hospital  Did a CT scan - family says it did not show anything   Found a uti- had to cath her Was put on bactrim ds - 5 d (finished today)  They did send it for a culture   CBC: Wbc 5.5 Hb 13.1 Pl 208  Glucose was 115  Chemistries normal   tropnin nl   UA was 2 plus for Leuk est and 10-15 wbc    Finished her abx today   Has dementia Does stay for periods of time at home alone Had one fall in June= bruised her knee-fell on L side - going to get into car and rocks in driveway shifted under her feet  Got a more stable cane  Daughter will get her walker  There are worries about safety- has made accommodations at home   Patient Active Problem List   Diagnosis Date Noted  . Vasovagal episode 02/13/2016  . UTI (urinary tract infection) 02/13/2016  . Lipoma of shoulder 03/27/2015  . History of closed Colles' fracture 09/06/2014  . Allergic rhinitis 06/20/2014  . Erosive gastritis 04/28/2014  . Duodenitis with bleeding 04/28/2014  . Normocytic anemia 04/28/2014  . Diastolic dysfunction [id number]  . Melena 03/21/2014  . History of GI bleed 03/21/2014  . Unspecified gastritis and gastroduodenitis without mention of hemorrhage 03/21/2014  . Near syncope 03/19/2014  . Dementia 09/01/2013  . Memory loss 09/01/2013  . Anxiety 09/01/2013  . Encounter for Medicare annual wellness exam 01/08/2013  . Gallstones 11/11/2011  . 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 Left hand weakness 06/04/2011  . Risk for falls  06/04/2011  . Constipation 03/05/2011  . Vitamin D deficiency 03/28/2009  . HYPERCHOLESTEROLEMIA, PURE 03/17/2007  . Essential hypertension 03/16/2007  . OSTEOARTHRITIS 03/16/2007  . Osteoporosis 03/16/2007   Past Medical History:  Diagnosis Date  . Asymptomatic gallstones   . Colitis - presumed infectious origin    one ER visit   . Hyperlipidemia   . Hypertension   . Osteoarthritis   . Osteoporosis    Past Surgical History:  Procedure Laterality Date  . ABDOMINAL HYSTERECTOMY     BSO- fibroids  . ABI's     normal  . dexa  1/05   osteoporosis  . ESOPHAGOGASTRODUODENOSCOPY N/A 03/21/2014   Procedure: ESOPHAGOGASTRODUODENOSCOPY (EGD);  Surgeon: Milus Banister, MD;  Location: [facility];  Service: Endoscopy;  Laterality: N/A;  . left foot brace    . WRIST FRACTURE SURGERY  2/01   R arm   Social History  Substance Use Topics  . Smoking status: Never Smoker  . Smokeless tobacco: Never Used  . Alcohol use No   Family History  Problem Relation Age of Onset  . Heart attack Father   . Hypertension Father   . Heart attack Mother   . Heart attack Father   . Throat cancer Brother    Allergies  Allergen Reactions  . Alendronate Sodium  Leg pain   . Amlodipine Besylate Hives  . Calcitonin ([place]) Other (See Comments)     headache/ head pressure  . Simvastatin Other (See Comments)    Leg pain    Current Outpatient Prescriptions on File Prior to Visit  Medication Sig Dispense Refill  . aspirin 81 MG chewable tablet Chew 81 mg by mouth daily.    . Cholecalciferol (VITAMIN D-3) 1000 units CAPS Take 2 capsules by mouth daily.     Marland Kitchen donepezil (ARICEPT) 23 MG TABS tablet Take 1 tablet (23 mg total) by mouth at bedtime. 30 tablet 11  . Fexofenadine HCl (ALLERGY 24-HR PO) Take 1 capsule by mouth daily.    . hydrochlorothiazide (HYDRODIURIL) 25 MG tablet Take 1 tablet (25 mg total) by mouth daily. 90 tablet 3  . lisinopril (PRINIVIL,ZESTRIL) 20 MG tablet Take 1 tablet (20  mg total) by mouth daily. 90 tablet 3  . metoprolol (LOPRESSOR) 50 MG tablet TAKE 1 TABLET (50 MG TOTAL) BY MOUTH 2 (TWO) TIMES DAILY. 180 tablet 3  . Multiple Vitamins-Minerals ([employer] SPECTRAVITE PO) Take 1 capsule by mouth daily.    Marland Kitchen omeprazole (PRILOSEC) 20 MG capsule Take 1 capsule (20 mg total) by mouth 2 (two) times daily. 180 capsule 3  . potassium chloride (KLOR-CON M10) 10 MEQ tablet Take 2 tablets (20 mEq total) by mouth daily. 180 tablet 3  . vitamin B-12 (CYANOCOBALAMIN) 1000 MCG tablet Take 1,000 mcg by mouth daily.     No current facility-administered medications on file prior to visit.      Review of Systems Review of Systems  Constitutional: Negative for fever, appetite change, fatigue and unexpected weight change.  Eyes: Negative for pain and visual disturbance.  Respiratory: Negative for cough and shortness of breath.   Cardiovascular: Negative for cp or palpitations    Gastrointestinal: Negative for nausea, diarrhea and constipation.  Genitourinary: Negative for urgency and frequency.  Skin: Negative for pallor or rash   Neurological: Negative for weakness, light-headedness, numbness and headaches. neg for any more syncope or seizure activity, neg for falls or head injury Hematological: Negative for adenopathy. Does not bruise/bleed easily.  Psychiatric/Behavioral: Negative for dysphoric mood. The patient is sometimes nervous/anxious.  pos for dementia with short term memory loss        Objective:   Physical Exam  Constitutional: She is oriented to person, place, and time. She appears well-developed and well-nourished. No distress.  Well appearing overwt elderly female with dementia who is in good spirits   Supportive daughter with her today  HENT:  Head: Normocephalic and atraumatic.  Right Ear: External ear normal.  Left Ear: External ear normal.  Nose: Nose normal.  Mouth/Throat: Oropharynx is clear and moist. No oropharyngeal exudate.  No sinus  tenderness No temporal tenderness  No TMJ tenderness  Eyes: Conjunctivae and EOM are normal. Pupils are equal, round, and reactive to light. Right eye exhibits no discharge. Left eye exhibits no discharge. No scleral icterus.  No nystagmus  Neck: Normal range of motion and full passive range of motion without pain. Neck supple. No JVD present. Carotid bruit is not present. No tracheal deviation present. No thyromegaly present.  Cardiovascular: Normal rate, regular rhythm and normal heart sounds.   No murmur heard. Pulmonary/Chest: Effort normal and breath sounds normal. No respiratory distress. She has no wheezes. She has no rales.  Abdominal: Soft. Bowel sounds are normal. She exhibits no distension and no mass. There is no tenderness.  Musculoskeletal: She exhibits no edema  or tenderness.  Lymphadenopathy:    She has no cervical adenopathy.  Neurological: She is alert and oriented to person, place, and time. She has normal strength and normal reflexes. She displays no atrophy and no tremor. No cranial nerve deficit or sensory deficit. She exhibits normal muscle tone. She displays a negative Romberg sign. Coordination and gait normal.  No focal cerebellar signs   Skin: Skin is warm and dry. No rash noted. No pallor.  Psychiatric: She has a normal mood and affect. Her behavior is normal. Thought content normal. Her mood appears not anxious. Her affect is not blunt and not labile. Her speech is tangential. Cognition and memory are impaired. She does not exhibit a depressed mood. She exhibits abnormal recent memory.  Talkative and pleasant -often tangential with poor memory  Daughter supplies most of her hx          Assessment & Plan:   Problem List Items Addressed This Visit      Cardiovascular and Mediastinum   Vasovagal episode - Primary    Reviewed hospital records/labs/reports on paper -they will be scanned (from ED in [place])  Pending urine culture  Pending CT report (I have  paperwork stating CT was normal)  Story consistent with vasovagal episode in frail elderly pt with dementia out in the heat/ ? Dehydrated and with incidental uti (all could add to it) Very reassuring w/u Also reassuring exam today Disc safety in detail  Will alert me if symptoms return and watch closely        Essential hypertension    bp in fair control at this time  BP Readings from Last 1 Encounters:  02/13/16 120/70   No changes needed Disc lifstyle change with low sodium diet and exercise  Per hx -pt had syncopal episode but bp was nl by the time EMS came No changes         Genitourinary   UTI (urinary tract infection)    Pt was tx with bactrim ds after ED visit Results reviewed Finishing 5d course today  Sent for culture result if it is back Clinically resolved       Other Visit Diagnoses   None.

## 2016-02-13 NOTE — Patient Instructions (Signed)
Swap cane out for a walker  Start checking in more frequently  Make a plan for the future - when it is no longer safe to be alone  Stop at check out to send for CT and urine culture from [place] [facility] Encourage water - as much as possible  If confusion returns or worsens- alert [place]

## 2016-02-15 NOTE — Assessment & Plan Note (Signed)
Pt was tx with bactrim ds after ED visit Results reviewed Finishing 5d course today  Sent for culture result if it is back Clinically resolved

## 2016-02-15 NOTE — Assessment & Plan Note (Signed)
Reviewed hospital records/labs/reports on paper -they will be scanned (from ED in [place])  Pending urine culture  Pending CT report (I have paperwork stating CT was normal)  Story consistent with vasovagal episode in frail elderly pt with dementia out in the heat/ ? Dehydrated and with incidental uti (all could add to it) Very reassuring w/u Also reassuring exam today Disc safety in detail  Will alert me if symptoms return and watch closely

## 2016-02-15 NOTE — Assessment & Plan Note (Signed)
bp in fair control at this time  BP Readings from Last 1 Encounters:  02/13/16 120/70   No changes needed Disc lifstyle change with low sodium diet and exercise  Per hx -pt had syncopal episode but bp was nl by the time EMS came No changes

## 2016-02-21 ENCOUNTER — Telehealth: Payer: Self-pay | Admitting: Family Medicine

## 2016-02-21 DIAGNOSIS — R55 Syncope and collapse: Secondary | ICD-10-CM

## 2016-02-21 DIAGNOSIS — I720 Aneurysm of carotid artery: Secondary | ICD-10-CM

## 2016-02-21 NOTE — Telephone Encounter (Signed)
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

## 2016-02-21 NOTE — Telephone Encounter (Signed)
Family decided that they do want to proceed with MRI/MRA please put referral in and I advise pt's daughter that our [facility] will call to schedule imaging

## 2016-02-21 NOTE — Telephone Encounter (Signed)
Pt's daughter notified of Urine cx results and Dr. Marliss Coots comments.   Daughter also notified of CT results and Dr. Marliss Coots comments. Pt's daughter is going to talk with pt and pt's family and decide if they want pt to have anymore f/u test (MRI) or just let it be given her age. Daughter [name] said she will talk with the family and let us know what they decide

## 2016-02-21 NOTE — Telephone Encounter (Signed)
Orders done Will route to [facility]  

## 2016-03-01 ENCOUNTER — Ambulatory Visit
Admission: RE | Admit: 2016-03-01 | Discharge: 2016-03-01 | Disposition: A | Discharge status: Home / Self Care | Payer: Medicare Other | Source: Ambulatory Visit | Attending: Family Medicine | Admitting: Family Medicine

## 2016-03-01 DIAGNOSIS — I720 Aneurysm of carotid artery: Secondary | ICD-10-CM

## 2016-03-01 DIAGNOSIS — R55 Syncope and collapse: Secondary | ICD-10-CM

## 2016-03-01 DIAGNOSIS — I639 Cerebral infarction, unspecified: Secondary | ICD-10-CM | POA: Diagnosis not present

## 2016-03-01 MED ORDER — GADOBENATE DIMEGLUMINE 529 MG/ML IV SOLN
10.0000 mL | Freq: Once | INTRAVENOUS | Status: AC | PRN
  Administered 2016-03-01: 7 mL via INTRAVENOUS

## 2016-03-06 ENCOUNTER — Telehealth: Payer: Self-pay | Admitting: Family Medicine

## 2016-03-06 DIAGNOSIS — I6930 Unspecified sequelae of cerebral infarction: Secondary | ICD-10-CM

## 2016-03-06 DIAGNOSIS — R55 Syncope and collapse: Secondary | ICD-10-CM

## 2016-03-06 DIAGNOSIS — I720 Aneurysm of carotid artery: Secondary | ICD-10-CM

## 2016-03-06 NOTE — Telephone Encounter (Signed)
-----   Message from Tammi Sou, [place] sent at 03/06/2016 12:36 PM EDT ----- Pt's daughter [name] notified of MR results and Dr. Marliss Coots recommendations. Daughter wants pt to see neuro asap given MR results. She request we put referral in and she doesn't care who pt sees she just wants who ever has the 1st available appt. Please put referral in and I advise pt our [facility] will call to schedule appt

## 2016-03-06 NOTE — Telephone Encounter (Signed)
Will route the ref to [facility]

## 2016-03-07 ENCOUNTER — Ambulatory Visit (INDEPENDENT_AMBULATORY_CARE_PROVIDER_SITE_OTHER): Payer: Medicare Other | Admitting: Neurology

## 2016-03-07 ENCOUNTER — Telehealth: Payer: Self-pay

## 2016-03-07 ENCOUNTER — Encounter: Payer: Self-pay | Admitting: Neurology

## 2016-03-07 VITALS — BP 157/99 | HR 71 | Ht 60.0 in | Wt 157.0 lb

## 2016-03-07 DIAGNOSIS — I639 Cerebral infarction, unspecified: Secondary | ICD-10-CM

## 2016-03-07 DIAGNOSIS — F0390 Unspecified dementia without behavioral disturbance: Secondary | ICD-10-CM

## 2016-03-07 DIAGNOSIS — I671 Cerebral aneurysm, nonruptured: Secondary | ICD-10-CM

## 2016-03-07 DIAGNOSIS — I6381 Other cerebral infarction due to occlusion or stenosis of small artery: Secondary | ICD-10-CM

## 2016-03-07 DIAGNOSIS — R7309 Other abnormal glucose: Secondary | ICD-10-CM | POA: Diagnosis not present

## 2016-03-07 MED ORDER — CLOPIDOGREL BISULFATE 75 MG PO TABS: 75.0000 mg | ORAL_TABLET | Freq: Every day | ORAL | 11 refills | Status: DC

## 2016-03-07 NOTE — Telephone Encounter (Signed)
Appt made for 03/07/16 with Dr Jaynee Eagles for today, 03/07/16

## 2016-03-07 NOTE — Progress Notes (Signed)
[facility]    Provider:  Dr Jaynee Eagles Referring Provider: Glori Bickers Wendy Fanny, MD Primary Care Physician:  Loura Pardon, MD  CC:  Stroke and seizure and memory loss  HPI:  Wendy Krueger is a 82 y.o. female here as a referral from Dr. Glori Bickers for stroke and seizure. Daughters are here and they provide most information. She has a past medical history of osteoporosis, hypertension, hyperlipidemia. She has had multiple episodes of altered awareness with confusion afterwards. Daughters and patient were at a beach in the shade. They noticed patient was getting pale looking. She leaned back, her eyes rolled back and she was pale and jerking.They thought she had a urinary tract infection. But then turns out she did not. She was not dehydrated. Patient does not remember the episode. She has dementia. Both arms jerked then she came to and they gave her some water. Episode lasted a few minutes. No inciting events or head trauma. No previous illnesses. She had a similar episode on the porch a previous where her eyes rolled back and she jerked her arms. A few days beforehand she got up in the morning after having slept late and she didn;t recognize anyone. She was confused , took some time for her to come back to baseline, paramedics came. This happened Thursday August 10th, she woke up she was not answering questions appropriately, she kept saying the same word. Memory changes started years ago and are slowly progressive. She gets up sometines at night and starts packing. Unknown triggers for these alterations of consciousness, no other associated symptoms, no urination or defecation. No inciting events or head trauma. No family history of seizures. Patient herself has no history of seizures as far as family and patient know.   Reviewed notes, labs and imaging from outside physicians, which showed:   Personally reviewed images of the brain and agree with following: MRI HEAD: No acute intracranial  process.  New small LEFT cerebellar infarcts, otherwise stable examination including moderate chronic small vessel ischemic disease.  MRA HEAD: Motion degraded examination. 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 Possible tiny basilar tip aneurysm.  Poor flow related enhancement of the LEFT internal carotid artery, posterior cerebral arteries bilaterally, likely motion artifact.  Severe stenosis RIGHT M1 segment, moderate stenosis distal LEFT M1 segment though these may be overestimated by motion artifact.  As clinically indicated, findings would be better characterized on CTA HEAD.  Review of Systems: Patient complains of symptoms per HPI as well as the following symptoms: No CP, no SOB. Pertinent negatives per HPI. All others negative.   Social History   Social History  . Marital status: Widowed    Spouse name: N/A  . Number of children: 5  . Years of education: 85   Occupational History  . retired Retired   Social History Main Topics  . Smoking status: Never Smoker  . Smokeless tobacco: Never Used  . Alcohol use No  . Drug use: No  . Sexual activity: No   Other Topics Concern  . Not on file   Social History Narrative   Has 4 brothers, 1 sister   Lives with 2 teenage grand kids and her daughter   Has 4 daughters, 1 son    Family History  Problem Relation Age of Onset  . Heart attack Father   . Hypertension Father   . Heart attack Mother   . Throat cancer Brother     Past Medical History:  Diagnosis  Date  . Asymptomatic gallstones   . Colitis - presumed infectious origin    one ER visit   . Hyperlipidemia   . Hypertension   . Osteoarthritis   . Osteoporosis     Past Surgical History:  Procedure Laterality Date  . ABDOMINAL HYSTERECTOMY     BSO- fibroids  . ABI's     normal  . dexa  1/05   osteoporosis  . ESOPHAGOGASTRODUODENOSCOPY N/A 03/21/2014   Procedure: ESOPHAGOGASTRODUODENOSCOPY (EGD);  Surgeon:  Milus Banister, MD;  Location: [facility];  Service: Endoscopy;  Laterality: N/A;  . left foot brace    . WRIST FRACTURE SURGERY  2/01   R arm    Current Outpatient Prescriptions  Medication Sig Dispense Refill  . Cholecalciferol (VITAMIN D-3) 1000 units CAPS Take 2 capsules by mouth daily.     Marland Kitchen donepezil (ARICEPT) 23 MG TABS tablet Take 1 tablet (23 mg total) by mouth at bedtime. 30 tablet 11  . Fexofenadine HCl (ALLERGY 24-HR PO) Take 1 capsule by mouth daily.    . hydrochlorothiazide (HYDRODIURIL) 25 MG tablet Take 1 tablet (25 mg total) by mouth daily. 90 tablet 3  . lisinopril (PRINIVIL,ZESTRIL) 20 MG tablet Take 1 tablet (20 mg total) by mouth daily. 90 tablet 3  . metoprolol (LOPRESSOR) 50 MG tablet TAKE 1 TABLET (50 MG TOTAL) BY MOUTH 2 (TWO) TIMES DAILY. 180 tablet 3  . Multiple Vitamins-Minerals ([facility] SPECTRAVITE PO) Take 1 capsule by mouth daily.    . potassium chloride (KLOR-CON M10) 10 MEQ tablet Take 2 tablets (20 mEq total) by mouth daily. 180 tablet 3  . vitamin B-12 (CYANOCOBALAMIN) 1000 MCG tablet Take 1,000 mcg by mouth daily.    . clopidogrel (PLAVIX) 75 MG tablet Take 1 tablet (75 mg total) by mouth daily. 30 tablet 11  . pantoprazole (PROTONIX) 40 MG tablet Take 1 tablet (40 mg total) by mouth daily. 30 tablet 11   No current facility-administered medications for this visit.     Allergies as of 03/07/2016 - Review Complete 03/07/2016  Allergen Reaction Noted  . Alendronate sodium  03/05/2011  . Amlodipine besylate Hives 03/16/2007  . Calcitonin (salmon) Other (See Comments) 07/23/2010  . Simvastatin Other (See Comments) 03/05/2011    Vitals: BP (!) 157/99 (BP Location: Right Arm, Patient Position: Sitting, Cuff Size: Normal)   Pulse 71   Ht 5' (1.524 m)   Wt 157 lb (71.2 kg)   BMI 30.66 kg/m  Last Weight:  Wt Readings from Last 1 Encounters:  03/07/16 157 lb (71.2 kg)   Last Height:   Ht Readings from Last 1 Encounters:  03/07/16 5' (1.524 m)     Physical exam: Exam: Gen: NAD, not conversant                  CV: RRR, no MRG. No Carotid Bruits. No peripheral edema, warm, nontender Eyes: Conjunctivae clear without exudates or hemorrhage  Neuro: Detailed Neurologic Exam  Speech:    Speech is normal; fluent, not spontaneous with impaired comprehension.  Cognition:    The patient is oriented to person;     recent and remote memory impaired;     language fluent;     Impaired attention, concentration,     fund of knowledge impaired Cranial Nerves:    The pupils are equal, round, and reactive to light. Attempted funduscopic exam could not visualize due to small pupils. Visual fields are full to finger confrontation. Extraocular movements are intact. Trigeminal sensation is  intact and the muscles of mastication are normal. The face is symmetric. The palate elevates in the midline. Hearing intact. Voice is normal. Shoulder shrug is normal. The tongue has normal motion without fasciculations.   Coordination:   No dysmetria  Gait:   Patient walks with a cane with normal stride  Motor Observation:    No asymmetry, no atrophy, and no involuntary movements noted. Tone:    Normal muscle tone.    Posture:    Posture is normal.    Strength: Bilateral mild hip flexion weakness otherwise strength is V/V in the upper and lower limbs.      Sensation: intact to LT     Reflex Exam:  DTR's: Absent ankle jerks otherwise deep tendon reflexes in the upper and lower extremities are normal bilaterally.   Toes:    The toes are equivocal bilaterally.   Clonus:    Clonus is absent.      Assessment/Plan:  82 year old female here for multiple complaints. Patient has a history of dementia, hypertension, hyperlipidemia. MRI of the brain showed intracranial large and small vessel atherosclerosis and new cerebellar infarct likely due to atherosclerosis. 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 Here with daughters who provide most information.  - discontinue ASA and start Plavix. Discussed side effects as per patient instructions including increased risk of bleeding. - Patient has a carotid terminus aneurysm 7 mm as compared to 6 mm 4 years ago. At a long discussion with patient and her daughters, I offered to refer them for cerebral angiogram and evaluation of stenting or coiling however even patient's age of 29 and her comorbidities including dementia and the risks of this procedure family decided to monitor and control blood pressure. - I had a long d/w patient about her recent stroke, risk for recurrent stroke/TIAs, personally independently reviewed imaging studies and stroke evaluation results and answered questions. StartPlavix for secondary stroke prevention and maintain strict control of hypertension with blood pressure goal below 130/90, diabetes with hemoglobin A1c goal below 6.5% and lipids with LDL cholesterol goal below 70 mg/dL.Patient has h/o statin intolerance hence recommend new PCSK9 inhibitor like Praluent. Recommend she see her primary MD to get prescription. I also advised the patient to eat a healthy diet with plenty of whole grains, cereals, fruits and vegetables, exercise regularly and maintain ideal body weight . - Patient appears to be having alterations of consciousness that would be seizures. I do recommend starting antiepileptic medications however family prefers to perform EEG and then possible 24-72 hour EEG for evaluation before considering new medication as patient takes a lot. Routine eeg  And then possibly a 24 hour eeg at home  - Dementia: At next appointment will perform a Mini-Mental status examination. Continue Aricept. May consider memantine pending results of memory testing. -Patient is unable to drive, operate heavy machinery, perform activities at heights or participate in water activities until 6 months seizure free. Patient is not  driving at this time. Discussed safety recommendations and monitoring. - Discussed Patients with epilepsy have a small risk of sudden unexpected death, a condition referred to as sudden unexpected death in epilepsy (SUDEP). SUDEP is defined specifically as the sudden, unexpected, witnessed or unwitnessed, nontraumatic and nondrowning death in patients with epilepsy with or without evidence for a seizure, and excluding documented status epilepticus, in which post mortem examination does not reveal a structural or toxicologic cause for death  - will order [id number]. Will ask  dr. Glori Bickers to monitor ldl and if > 70 rhen consider new PCSK9 inhibitor like Praluent. If help is needed I can recommend to cardiology to try and get it approves and to their lipid clinic.   Cc: Dr. Modena Morrow, [facility] [address] [place], [place] [address]  Phone [phone] Fax [phone]

## 2016-03-07 NOTE — Telephone Encounter (Signed)
[facility] left v/m about possible drug interaction; pt was started on plavix by neurologist and pt is taking omeprazole. Omeprazole may decrease effectiveness of plavix. Pt had recent stroke. Is there a substitute med for omeprazole. Please advise.

## 2016-03-07 NOTE — Patient Instructions (Addendum)
Remember to drink plenty of fluid, eat healthy meals and do not skip any meals. Try to eat protein with a every meal and eat a healthy snack such as fruit or nuts in between meals. Try to keep a regular sleep-wake schedule and try to exercise daily, particularly in the form of walking, 20-30 minutes a day, if you can.   As far as your medications are concerned, I would like to suggest: Stop aspirin and start Plavix.   As far as diagnostic testing: EEG,   I would like to see you back in 3 months, sooner if we need to. Please call us with any interim questions, concerns, problems, updates or refill requests.   Our phone number is [phone]. We also have an after hours call service for urgent matters and there is a physician on-call for urgent questions. For any emergencies you know to call 911 or go to the nearest emergency room  Clopidogrel tablets What is this medicine? CLOPIDOGREL (kloh PID oh grel) helps to prevent blood clots. This medicine is used to prevent heart attack, stroke, or other vascular events in people who are at high risk. This medicine may be used for other purposes; ask your health care provider or pharmacist if you have questions. What should I tell my health care provider before I take this medicine? They need to know if you have any of the following conditions: -bleeding disorder -bleeding in the brain -planned surgery -stomach or intestinal ulcers -stroke or transient ischemic attack -an unusual or allergic reaction to clopidogrel, other medicines, foods, dyes, or preservatives -pregnant or trying to get pregnant -breast-feeding How should I use this medicine? Take this medicine by mouth with a drink of water. Follow the directions on the prescription label. You may take this medicine with or without food. Take your medicine at regular intervals. Do not take your medicine more often than directed. Talk to your pediatrician regarding the use of this medicine in  children. Special care may be needed. Overdosage: If you think you have taken too much of this medicine contact a poison control center or emergency room at once. NOTE: This medicine is only for you. Do not share this medicine with others. What if I miss a dose? If you miss a dose, take it as soon as you can. If it is almost time for your next dose, take only that dose. Do not take double or extra doses. What may interact with this medicine? -aspirin -blood thinners like cilostazol, enoxaparin, ticlopidine, and warfarin -certain medicines for depression like citalopram, fluoxetine, and fluvoxamine -certain medicines for fungal infections like ketoconazole, fluconazole, and voriconazole -certain medicines for HIV infection like delavirdine, efavirenz, and etravirine -certain medicines for seizures like felbamate, oxcarbazepine, and phenytoin -chloramphenicol -fluvastatin -isoniazid, INH -medicines for inflammation like ibuprofen and naproxen -modafinil -nicardipine -over-the counter supplements like echinacea, feverfew, fish oil, garlic, ginger, ginkgo, green tea, horse chestnut -quinine -stomach acid blockers like cimetidine, omeprazole, and esomeprazole -tamoxifen -tolbutamide -topiramate -torsemide This list may not describe all possible interactions. Give your health care provider a list of all the medicines, herbs, non-prescription drugs, or dietary supplements you use. Also tell them if you smoke, drink alcohol, or use illegal drugs. Some items may interact with your medicine. What should I watch for while using this medicine? Visit your doctor or health care professional for regular check ups. Do not stop taking your medicine unless your doctor tells you to. Notify your doctor or health care professional and seek emergency treatment  if you develop breathing problems; changes in vision; chest pain; severe, sudden headache; pain, swelling, warmth in the leg; trouble speaking; sudden  numbness or weakness of the face, arm or leg. These can be signs that your condition has gotten worse. If you are going to have surgery or dental work, tell your doctor or health care professional that you are taking this medicine. Certain genetic factors may reduce the effect of this medicine. Your doctor may use genetic tests to determine treatment. What side effects may I notice from receiving this medicine? Side effects that you should report to your doctor or health care professional as soon as possible: -allergic reactions like skin rash, itching or hives, swelling of the face, lips, or tongue -breathing problems -changes in vision -fever -signs and symptoms of bleeding such as bloody or black, tarry stools; red or dark-brown urine; spitting up blood or brown material that looks like coffee grounds; red spots on the skin; unusual bruising or bleeding from the eye, gums, or nose -sudden weakness -unusual bleeding or bruising Side effects that usually do not require medical attention (report to your doctor or health care professional if they continue or are bothersome): -constipation or diarrhea -headache -pain in back or joints -stomach upset This list may not describe all possible side effects. Call your doctor for medical advice about side effects. You may report side effects to FDA at 1-800-FDA-1088. Where should I keep my medicine? Keep out of the reach of children. Store at room temperature of 59 to 86 degrees F (15 to 30 degrees C). Throw away any unused medicine after the expiration date. NOTE: This sheet is a summary. It may not cover all possible information. If you have questions about this medicine, talk to your doctor, pharmacist, or health care provider.    2016, Elsevier/Gold Standard. (2012-10-13 16:34:37)

## 2016-03-07 NOTE — Telephone Encounter (Signed)
Her neurologist must have been aware  There is this potential with all medicines of this class If her insurance covers protonix 40 mg (generic) then that may have slightly less risk- can the pharmacy tell if it is covered?

## 2016-03-08 ENCOUNTER — Telehealth: Payer: Self-pay | Admitting: *Deleted

## 2016-03-08 LAB — HEMOGLOBIN A1C
ESTIMATED AVERAGE GLUCOSE: 108 mg/dL
Hgb A1c MFr Bld: 5.4 % (ref 4.8–5.6)

## 2016-03-08 MED ORDER — PANTOPRAZOLE SODIUM 40 MG PO TBEC: 40.0000 mg | DELAYED_RELEASE_TABLET | Freq: Every day | ORAL | 11 refills | Status: DC

## 2016-03-08 NOTE — Telephone Encounter (Signed)
LVM for daughter [name] (on [place]) about normal labs per Dr Jaynee Eagles. Gave [facility] phone number if they have further questions.

## 2016-03-08 NOTE — Telephone Encounter (Signed)
Spoke with pharmacy and Rx for protonix sent and they will cancel the Rx for omeprazole. Left voicemail letting pt's daughter know new Rx sent to pharmacy and to d/c the omeprazole

## 2016-03-08 NOTE — Telephone Encounter (Signed)
pts daughter did not leave name left v/m requesting cb about whether to give omeprazole or not.

## 2016-03-08 NOTE — Telephone Encounter (Signed)
-----   Message from Melvenia Beam, MD sent at 03/08/2016  8:55 AM EDT ----- Labs normal thanks

## 2016-03-10 DIAGNOSIS — Z8673 Personal history of transient ischemic attack (TIA), and cerebral infarction without residual deficits: Secondary | ICD-10-CM | POA: Insufficient documentation

## 2016-03-10 DIAGNOSIS — I639 Cerebral infarction, unspecified: Secondary | ICD-10-CM | POA: Insufficient documentation

## 2016-03-10 NOTE — Progress Notes (Signed)
Please let pt's family know I rev her neuro note-they recommend I ref her to cardiology to see if she would qualify for a PCYK9 inhibitor to control cholesterol and prevent further strokes I would have to refer her to cardiology Please ask if they are open to that, thanks

## 2016-03-11 ENCOUNTER — Telehealth: Payer: Self-pay | Admitting: *Deleted

## 2016-03-11 DIAGNOSIS — E7800 Pure hypercholesterolemia, unspecified: Secondary | ICD-10-CM

## 2016-03-11 DIAGNOSIS — I6930 Unspecified sequelae of cerebral infarction: Secondary | ICD-10-CM

## 2016-03-11 NOTE — Telephone Encounter (Signed)
Ref done  Will route to [facility] 

## 2016-03-11 NOTE — Telephone Encounter (Signed)
-----   Message from Abner Greenspan, MD sent at 03/10/2016  8:19 PM EDT -----   ----- Message ----- From: Melvenia Beam, MD Sent: 03/10/2016   7:56 PM To: Abner Greenspan, MD  Thank you very much for allowing me to participate in the care of this nice patient. I am forwarding my office encounter to you for review. Patient has eaten today, hoping you could recheck her ldl and then consider new PCSK9 inhibitor like Praluent. If help is needed to get this approved then let me know and I can recommend her to cardiology to try and get it approved as they have a lipid clinic. See my notes for details, thanks.   If you have any questions or concerns,  please do not hesitate to call me at [phone].   Sincerely,  Georgia Dom, MD

## 2016-03-11 NOTE — Telephone Encounter (Signed)
Spoke with pt's daughter [name] and they do want to precede with referral to cardiology to see if she can get in to the lipid clinic, please put referral in and I advise daughter our [facility] will call her to schedule appt

## 2016-03-11 NOTE — Telephone Encounter (Signed)
Appt made with Dr Gwenlyn Found patients daughter notified.

## 2016-03-26 ENCOUNTER — Ambulatory Visit (INDEPENDENT_AMBULATORY_CARE_PROVIDER_SITE_OTHER): Payer: Medicare Other | Admitting: Neurology

## 2016-03-26 DIAGNOSIS — R410 Disorientation, unspecified: Secondary | ICD-10-CM | POA: Diagnosis not present

## 2016-03-26 DIAGNOSIS — I6381 Other cerebral infarction due to occlusion or stenosis of small artery: Secondary | ICD-10-CM

## 2016-03-26 DIAGNOSIS — R404 Transient alteration of awareness: Secondary | ICD-10-CM

## 2016-03-26 NOTE — Procedures (Signed)
    History:  Wendy Krueger is an 82 year old patient with a history of a memory disturbance. The patient has had multiple episodes of altered awareness and confusion. The patient had one episode where she became pale and started jerking. The patient is being evaluated for possible seizures.  This is a routine EEG. No skull defects are noted. Medications include vitamin D, Aricept, hydrochlorothiazide, lisinopril, metoprolol, multivitamins, potassium supplementation, vitamin B12, Plavix, and Protonix.   EEG classification: Normal awake  Description of the recording: The background rhythms of this recording consists of a fairly well modulated medium amplitude alpha rhythm of 8 Hz that is reactive to eye opening and closure. As the record progresses, the patient appears to remain in the waking state throughout the recording. Photic stimulation was performed, resulting in a bilateral and symmetric photic driving response. Hyperventilation was not performed. At no time during the recording does there appear to be evidence of spike or spike wave discharges or evidence of focal slowing. EKG monitor shows no evidence of cardiac rhythm abnormalities with a heart rate of 54.  Impression: This is a normal EEG recording in the waking state. No evidence of ictal or interictal discharges are seen.

## 2016-03-27 ENCOUNTER — Telehealth: Payer: Self-pay | Admitting: *Deleted

## 2016-03-27 NOTE — Telephone Encounter (Signed)
Called and LVM for Wendy Krueger (daughter) about normal EEG results. Asked her to call back if okay to send orders for 24-48 hr in home EEG per Dr Jaynee Eagles recommendation. Gave [facility] phone number.

## 2016-03-27 NOTE — Telephone Encounter (Signed)
-----   Message from Melvenia Beam, MD sent at 03/26/2016  2:18 PM EDT ----- EEG was normal. I recommend a 24-72 hour home eeg let me know what they say thanks

## 2016-03-28 NOTE — Telephone Encounter (Signed)
Called and spoke to [place] (daughter) about EEG results. She is agreeable to have 3-day in hom eEEG. Advised we will send order and they will call to schedule ([facility]). She verbalized understanding.

## 2016-03-29 ENCOUNTER — Encounter: Payer: Self-pay | Admitting: Cardiovascular Disease

## 2016-03-29 ENCOUNTER — Ambulatory Visit (INDEPENDENT_AMBULATORY_CARE_PROVIDER_SITE_OTHER): Payer: Medicare Other | Admitting: Cardiovascular Disease

## 2016-03-29 VITALS — BP 136/71 | HR 60 | Ht 60.0 in | Wt 156.4 lb

## 2016-03-29 DIAGNOSIS — I10 Essential (primary) hypertension: Secondary | ICD-10-CM | POA: Diagnosis not present

## 2016-03-29 DIAGNOSIS — E7800 Pure hypercholesterolemia, unspecified: Secondary | ICD-10-CM

## 2016-03-29 DIAGNOSIS — I639 Cerebral infarction, unspecified: Secondary | ICD-10-CM | POA: Diagnosis not present

## 2016-03-29 NOTE — Assessment & Plan Note (Signed)
History of hyperlipidemia intolerant to statin therapy. She was referred here for consideration of initiating PCSK9 monoclonal injectables. Unfortunately, I do not think she would qualify given her lack of vascular history.

## 2016-03-29 NOTE — Progress Notes (Signed)
03/29/2016 Wendy Krueger   1930-04-27  [id number]  Primary Physician Loura Pardon, MD Primary Cardiologist: Lorretta Harp MD Renae Gloss  HPI:  Wendy Krueger is an 82 year old mildly overweight widowed Caucasian female mother of 5 children, grandmother of 65 grandchildren was covered by her 2 daughters [name] and [name]. She was referred by Dr. Alba Cory for consideration of initiating PCSK9 monoclonal injectables 4 hyperlipidemia intolerant to statin therapy. She has no prior cardiac history. She does have a cerebral aneurysm which is being followed noninvasively. She's had 2 episodes of syncope of unclear etiology. She denies chest pain or shortness of breath. She has never had a heart attack or stroke.   Current Outpatient Prescriptions  Medication Sig Dispense Refill  . Cholecalciferol (VITAMIN D-3) 1000 units CAPS Take 2 capsules by mouth daily.     . clopidogrel (PLAVIX) 75 MG tablet Take 1 tablet (75 mg total) by mouth daily. 30 tablet 11  . donepezil (ARICEPT) 23 MG TABS tablet Take 1 tablet (23 mg total) by mouth at bedtime. 30 tablet 11  . Fexofenadine HCl (ALLERGY 24-HR PO) Take 1 capsule by mouth daily.    . hydrochlorothiazide (HYDRODIURIL) 25 MG tablet Take 1 tablet (25 mg total) by mouth daily. 90 tablet 3  . lisinopril (PRINIVIL,ZESTRIL) 20 MG tablet Take 1 tablet (20 mg total) by mouth daily. 90 tablet 3  . metoprolol (LOPRESSOR) 50 MG tablet TAKE 1 TABLET (50 MG TOTAL) BY MOUTH 2 (TWO) TIMES DAILY. 180 tablet 3  . Multiple Vitamins-Minerals ([employer] SPECTRAVITE PO) Take 1 capsule by mouth daily.    . pantoprazole (PROTONIX) 40 MG tablet Take 1 tablet (40 mg total) by mouth daily. 30 tablet 11  . potassium chloride (KLOR-CON M10) 10 MEQ tablet Take 2 tablets (20 mEq total) by mouth daily. 180 tablet 3  . vitamin B-12 (CYANOCOBALAMIN) 1000 MCG tablet Take 1,000 mcg by mouth daily.     No current facility-administered medications for this visit.     Allergies    Allergen Reactions  . Alendronate Sodium     Leg pain   . Amlodipine Besylate Hives  . Calcitonin ([place]) Other (See Comments)     headache/ head pressure  . Simvastatin Other (See Comments)    Leg pain     Social History   Social History  . Marital status: Widowed    Spouse name: N/A  . Number of children: 5  . Years of education: 42   Occupational History  . retired Retired   Social History Main Topics  . Smoking status: Never Smoker  . Smokeless tobacco: Never Used  . Alcohol use No  . Drug use: No  . Sexual activity: No   Other Topics Concern  . Not on file   Social History Narrative   Has 4 brothers, 1 sister   Lives with 2 teenage grand kids and her daughter   Has 4 daughters, 1 son     Review of Systems: General: negative for chills, fever, night sweats or weight changes.  Cardiovascular: negative for chest pain, dyspnea on exertion, edema, orthopnea, palpitations, paroxysmal nocturnal dyspnea or shortness of breath Dermatological: negative for rash Respiratory: negative for cough or wheezing Urologic: negative for hematuria Abdominal: negative for nausea, vomiting, diarrhea, bright red blood per rectum, melena, or hematemesis Neurologic: negative for visual changes, syncope, or dizziness All other systems reviewed and are otherwise negative except as noted above.    Blood pressure 136/71, pulse 60, height  5' (1.524 m), weight 156 lb 6.4 oz (70.9 kg).  General appearance: alert and no distress Neck: no adenopathy, no carotid bruit, no JVD, supple, symmetrical, trachea midline and thyroid not enlarged, symmetric, no tenderness/mass/nodules Lungs: clear to auscultation bilaterally Heart: regular rate and rhythm, S1, S2 normal, no murmur, click, rub or gallop Extremities: extremities normal, atraumatic, no cyanosis or edema  EKG normal sinus rhythm at 60 with nonspecific IVCD. I personally reviewed this EKG    ASSESSMENT AND PLAN:    HYPERCHOLESTEROLEMIA, PURE History of hyperlipidemia intolerant to statin therapy. She was referred here for consideration of initiating PCSK9 monoclonal injectables. Unfortunately, I do not think she would qualify given her lack of vascular history.      Lorretta Harp MD FACP,FACC,FAHA, [facility] 03/29/2016 12:52 PM

## 2016-03-29 NOTE — Patient Instructions (Addendum)
Medication Instructions:  NO CHANGES.   Follow-Up: Your physician recommends that you schedule a follow-up appointment in: AS NEEDED.   If you need a refill on your cardiac medications before your next appointment, please call your pharmacy.   

## 2016-04-01 ENCOUNTER — Telehealth: Payer: Self-pay | Admitting: Neurology

## 2016-04-01 NOTE — Telephone Encounter (Signed)
Faxed EEG results/last office note per request. Fax: [phone]. Received confirmation.

## 2016-04-01 NOTE — Telephone Encounter (Signed)
Faxed completed form to [facility] to schedule pt for in-home 72-hour EEG. Fax: [phone]. Received confirmation.   

## 2016-04-01 NOTE — Telephone Encounter (Signed)
Tammy/[facility] [phone] called to request last office visit notes and last EEG (states MCR requires EEG within last 12 months).

## 2016-04-02 ENCOUNTER — Telehealth: Payer: Self-pay

## 2016-04-02 NOTE — Telephone Encounter (Signed)
Spoke with daughter and she said the lipid clinic recommended an injection medication for her cholesterol but it was to expensive ($1400 monthly). Since simvastatin gave pt muscle pains pt's daughter was wondering if you think it would be a good idea for her to take an OTC vitamin like Krill oil, or omega red, or any other OTC med you recommend that she tries, please advise

## 2016-04-02 NOTE — Telephone Encounter (Signed)
I think she has just tried simvastatin - it gave her muscle pain in her legs (which is often a class effect of these medications)- if she is open to trying another one just to see for sure- I would recommend generic crestor or lipitor   (crestor would be my first choice)

## 2016-04-02 NOTE — Telephone Encounter (Signed)
Any of those omega 3 supplements may or may not raise HDL a bit or lower triglycerides a few points -usually not a lot more than that- but it they want to try an omega 3 fish oil or supplement that is fine

## 2016-04-02 NOTE — Telephone Encounter (Signed)
Wendy Krueger DPR signed) left v/m requesting names of cholesterol med pt has already tried. I see on hx med list that simvastatin is listed. I do not see note in previous annual exam of discussion of hyperlipidemia med. Please advise.

## 2016-04-03 NOTE — Telephone Encounter (Signed)
Daughter notified of Dr. Marliss Coots comments/ recommendations and verbalized understanding. Daughter dose want to try the fish oil OTC for a few months to see if it will help

## 2016-04-05 DIAGNOSIS — R569 Unspecified convulsions: Secondary | ICD-10-CM | POA: Diagnosis not present

## 2016-04-06 DIAGNOSIS — R569 Unspecified convulsions: Secondary | ICD-10-CM | POA: Diagnosis not present

## 2016-04-07 DIAGNOSIS — R569 Unspecified convulsions: Secondary | ICD-10-CM | POA: Diagnosis not present

## 2016-04-09 NOTE — Telephone Encounter (Signed)
Dr Jaynee EaglesJuluis Rainier  Received physician status notification from [facility] that the study is completed and has been loaded into server. They will send notification within 48 business hrs when report is scanned and ready for report to be generated for Dr Junius Argyle to review/interpret.

## 2016-04-09 NOTE — Telephone Encounter (Signed)
thanks

## 2016-04-10 ENCOUNTER — Ambulatory Visit: Payer: Medicare Other | Admitting: Cardiovascular Disease

## 2016-04-11 NOTE — Telephone Encounter (Signed)
Dr Jaynee EaglesJuluis Rainier  Received physician status notification from [facility] that report has been scanned into their system. Waiting on Dr Junius Argyle to interpret report and then they will notify us.

## 2016-04-16 NOTE — Telephone Encounter (Signed)
Please let family know. We can discuss it at next appointment thanks

## 2016-04-16 NOTE — Telephone Encounter (Signed)
Dr Jaynee Eagles- Juluis Rainier. I can call pt with results if warranted. What do you recommend next steps be? Would you like WID to review results office since you are not here?  Received EEG results via fax from [facility]: "This was a normal prolonged ambulatory 72-hour video EEG. No epileptiform discharges seen. No electrographic or electroclinical events present. There was no focal or background slowing seen. There were no push button events. Owing to this prolonged VEEG being normal, other, non-epileptic causes should be considered for this patient's symptoms".

## 2016-04-17 NOTE — Telephone Encounter (Signed)
Called and LVM for daughter [name] about EEG results. Advised Dr Jaynee Eagles will speak with them more at next f/u on 12/7 at 230pm, check in 215pm. Gave Soledad phone number if she has further questions.

## 2016-06-06 ENCOUNTER — Ambulatory Visit (INDEPENDENT_AMBULATORY_CARE_PROVIDER_SITE_OTHER): Payer: Medicare Other | Admitting: Neurology

## 2016-06-06 ENCOUNTER — Encounter: Payer: Self-pay | Admitting: Neurology

## 2016-06-06 VITALS — BP 186/92 | HR 63 | Ht 60.0 in | Wt 156.2 lb

## 2016-06-06 DIAGNOSIS — R269 Unspecified abnormalities of gait and mobility: Secondary | ICD-10-CM

## 2016-06-06 DIAGNOSIS — I639 Cerebral infarction, unspecified: Secondary | ICD-10-CM | POA: Diagnosis not present

## 2016-06-06 DIAGNOSIS — F0390 Unspecified dementia without behavioral disturbance: Secondary | ICD-10-CM

## 2016-06-06 DIAGNOSIS — W19XXXD Unspecified fall, subsequent encounter: Secondary | ICD-10-CM

## 2016-06-06 MED ORDER — MEMANTINE HCL 10 MG PO TABS: 10.0000 mg | ORAL_TABLET | Freq: Two times a day (BID) | ORAL | 12 refills | Status: DC

## 2016-06-06 MED ORDER — DONEPEZIL HCL 10 MG PO TABS: 10.0000 mg | ORAL_TABLET | Freq: Every day | ORAL | 12 refills | Status: DC

## 2016-06-06 NOTE — Patient Instructions (Addendum)
Remember to drink plenty of fluid, eat healthy meals and do not skip any meals. Try to eat protein with a every meal and eat a healthy snack such as fruit or nuts in between meals. Try to keep a regular sleep-wake schedule and try to exercise daily, particularly in the form of walking, 20-30 minutes a day, if you can.   As far as your medications are concerned, I would like to suggest: Aricept 10mg  daily. Start Namenda 10mg  daily then in one month increase to 10mg  twice daily. Continue Plavix.   Home physical therapy and skilled nursing  I would like to see you back in 6 months, sooner if we need to. Please call us with any interim questions, concerns, problems, updates or refill requests.   Our phone number is [phone]. We also have an after hours call service for urgent matters and there is a physician on-call for urgent questions. For any emergencies you know to call 911 or go to the nearest emergency room  Memantine Tablets What is this medicine? MEMANTINE (MEM an teen) is used to treat dementia caused by Alzheimer's disease. This medicine may be used for other purposes; ask your health care provider or pharmacist if you have questions. COMMON BRAND NAME(S): Namenda What should I tell my health care provider before I take this medicine? They need to know if you have any of these conditions: -difficulty passing urine -kidney disease -liver disease -seizures -an unusual or allergic reaction to memantine, other medicines, foods, dyes, or preservatives -pregnant or trying to get pregnant -breast-feeding How should I use this medicine? Take this medicine by mouth with a glass of water. Follow the directions on the prescription label. You may take this medicine with or without food. Take your doses at regular intervals. Do not take your medicine more often than directed. Continue to take your medicine even if you feel better. Do not stop taking except on the advice of your doctor or health  care professional. Talk to your pediatrician regarding the use of this medicine in children. Special care may be needed. Overdosage: If you think you have taken too much of this medicine contact a poison control center or emergency room at once. NOTE: This medicine is only for you. Do not share this medicine with others. What if I miss a dose? If you miss a dose, take it as soon as you can. If it is almost time for your next dose, take only that dose. Do not take double or extra doses. If you do not take your medicine for several days, contact your health care provider. Your dose may need to be changed. What may interact with this medicine? -acetazolamide -amantadine -cimetidine -dextromethorphan -dofetilide -hydrochlorothiazide -ketamine -metformin -methazolamide -quinidine -ranitidine -sodium bicarbonate -triamterene This list may not describe all possible interactions. Give your health care provider a list of all the medicines, herbs, non-prescription drugs, or dietary supplements you use. Also tell them if you smoke, drink alcohol, or use illegal drugs. Some items may interact with your medicine. What should I watch for while using this medicine? Visit your doctor or health care professional for regular checks on your progress. Check with your doctor or health care professional if there is no improvement in your symptoms or if they get worse. You may get drowsy or dizzy. Do not drive, use machinery, or do anything that needs mental alertness until you know how this drug affects you. Do not stand or sit up quickly, especially if you are an  older patient. This reduces the risk of dizzy or fainting spells. Alcohol can make you more drowsy and dizzy. Avoid alcoholic drinks. What side effects may I notice from receiving this medicine? Side effects that you should report to your doctor or health care professional as soon as possible: -allergic reactions like skin rash, itching or hives,  swelling of the face, lips, or tongue -agitation or a feeling of restlessness -depressed mood -dizziness -hallucinations -redness, blistering, peeling or loosening of the skin, including inside the mouth -seizures -vomiting Side effects that usually do not require medical attention (report to your doctor or health care professional if they continue or are bothersome): -constipation -diarrhea -headache -nausea -trouble sleeping This list may not describe all possible side effects. Call your doctor for medical advice about side effects. You may report side effects to FDA at 1-800-FDA-1088. Where should I keep my medicine? Keep out of the reach of children. Store at room temperature between 15 degrees and 30 degrees C (59 degrees and 86 degrees F). Throw away any unused medicine after the expiration date. NOTE: This sheet is a summary. It may not cover all possible information. If you have questions about this medicine, talk to your doctor, pharmacist, or health care provider.  2017 [employer] Standard (2013-04-05 14:10:42)

## 2016-06-06 NOTE — Progress Notes (Signed)
[facility]    Provider:  Dr Jaynee Eagles Referring Provider: Abner Greenspan, MD Primary Care Physician:  Loura Pardon, MD  CC:  Stroke and seizure and memory loss  Interval history:  Routine and prolonged EEG were normal. Daughter provides most information. No more episodes of alteration of awareness. She is having increased difficulty walking, transferring and performing her ADLs and worsening dementia. She is a fall risk, offered to send PT at her home due to her disability and inability to leave the home unassisted. She also needs nursing for teaching of disease process,  blood pressure monitoring as it has been elevated and running high. Home inspection with evaluation of fall risks would also be helpful, she has fallen. She has dementia and worsening confusion.   Routine EEG: Normal 72 hour ambulatory EEG: This was a normal prolonged ambulatory 72-hour video EEG. No epileptiform discharges seen. No electrographic or electroclinical events present. There was no focal or background slowing seen. There were no push button events. Owing to this prolonged VEEG being normal, other, non-epileptic causes should be considered for this patient's symptoms".  HPI:  Wendy Krueger is a 82 y.o. female here as a referral from Dr. Glori Bickers for stroke and seizure. Daughters are here and they provide most information. She has a past medical history of osteoporosis, hypertension, hyperlipidemia. She has had multiple episodes of altered awareness with confusion afterwards. Daughters and patient were at a beach in the shade. They noticed patient was getting pale looking. She leaned back, her eyes rolled back and she was pale and jerking.They thought she had a urinary tract infection. But then turns out she did not. She was not dehydrated. Patient does not remember the episode. She has dementia. Both arms jerked then she came to and they gave her some water. Episode lasted a few minutes. No inciting  events or head trauma. No previous illnesses. She had a similar episode on the porch a previous where her eyes rolled back and she jerked her arms. A few days beforehand she got up in the morning after having slept late and she didn;t recognize anyone. She was confused , took some time for her to come back to baseline, paramedics came. This happened Thursday August 10th, she woke up she was not answering questions appropriately, she kept saying the same word. Memory changes started years ago and are slowly progressive. She gets up sometines at night and starts packing. Unknown triggers for these alterations of consciousness, no other associated symptoms, no urination or defecation. No inciting events or head trauma. No family history of seizures. Patient herself has no history of seizures as far as family and patient know.   Reviewed notes, labs and imaging from outside physicians, which showed:   Personally reviewed images of the brain and agree with following: MRI HEAD: No acute intracranial process.  New small LEFT cerebellar infarcts, otherwise stable examination including moderate chronic small vessel ischemic disease.  MRA HEAD: Motion degraded examination. 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 Possible tiny basilar tip aneurysm.  Poor flow related enhancement of the LEFT internal carotid artery, posterior cerebral arteries bilaterally, likely motion artifact.  Severe stenosis RIGHT M1 segment, moderate stenosis distal LEFT M1 segment though these may be overestimated by motion artifact.  As clinically indicated, findings would be better characterized on CTA HEAD.  Review of Systems: Patient complains of symptoms per HPI as well as the following symptoms: No CP, no SOB. Pertinent negatives per  HPI. All others negative.    Social History   Social History  . Marital status: Widowed    Spouse name: N/A  . Number of children: 5  . Years  of education: 108   Occupational History  . retired Retired   Social History Main Topics  . Smoking status: Never Smoker  . Smokeless tobacco: Never Used  . Alcohol use No  . Drug use: No  . Sexual activity: No   Other Topics Concern  . Not on file   Social History Narrative   Has 4 brothers, 1 sister   Lives with 2 teenage grand kids and her daughter   Has 4 daughters, 1 son    Family History  Problem Relation Age of Onset  . Heart attack Father   . Hypertension Father   . Heart attack Mother   . Throat cancer Brother     Past Medical History:  Diagnosis Date  . Asymptomatic gallstones   . Colitis - presumed infectious origin    one ER visit   . Hyperlipidemia   . Hypertension   . Osteoarthritis   . Osteoporosis     Past Surgical History:  Procedure Laterality Date  . ABDOMINAL HYSTERECTOMY     BSO- fibroids  . ABI's     normal  . dexa  1/05   osteoporosis  . ESOPHAGOGASTRODUODENOSCOPY N/A 03/21/2014   Procedure: ESOPHAGOGASTRODUODENOSCOPY (EGD);  Surgeon: Milus Banister, MD;  Location: [facility];  Service: Endoscopy;  Laterality: N/A;  . left foot brace    . WRIST FRACTURE SURGERY  2/01   R arm    Current Outpatient Prescriptions  Medication Sig Dispense Refill  . Cholecalciferol (VITAMIN D-3) 1000 units CAPS Take 2 capsules by mouth daily.     . clopidogrel (PLAVIX) 75 MG tablet Take 1 tablet (75 mg total) by mouth daily. 30 tablet 11  . donepezil (ARICEPT) 23 MG TABS tablet Take 1 tablet (23 mg total) by mouth at bedtime. 30 tablet 11  . Fexofenadine HCl (ALLERGY 24-HR PO) Take 1 capsule by mouth daily.    . hydrochlorothiazide (HYDRODIURIL) 25 MG tablet Take 1 tablet (25 mg total) by mouth daily. 90 tablet 3  . lisinopril (PRINIVIL,ZESTRIL) 20 MG tablet Take 1 tablet (20 mg total) by mouth daily. 90 tablet 3  . metoprolol (LOPRESSOR) 50 MG tablet TAKE 1 TABLET (50 MG TOTAL) BY MOUTH 2 (TWO) TIMES DAILY. 180 tablet 3  . Multiple Vitamins-Minerals  ([employer]S SPECTRAVITE PO) Take 1 capsule by mouth daily.    . pantoprazole (PROTONIX) 40 MG tablet Take 1 tablet (40 mg total) by mouth daily. 30 tablet 11  . potassium chloride (KLOR-CON M10) 10 MEQ tablet Take 2 tablets (20 mEq total) by mouth daily. 180 tablet 3  . vitamin B-12 (CYANOCOBALAMIN) 1000 MCG tablet Take 1,000 mcg by mouth daily.     No current facility-administered medications for this visit.     Allergies as of 06/06/2016 - Review Complete 06/06/2016  Allergen Reaction Noted  . Alendronate sodium  03/05/2011  . Amlodipine besylate Hives 03/16/2007  . Calcitonin (salmon) Other (See Comments) 07/23/2010  . Simvastatin Other (See Comments) 03/05/2011    Vitals: BP (!) 186/92 (BP Location: Right Arm, Patient Position: Sitting, Cuff Size: Normal)   Pulse 63   Wt 156 lb 3.2 oz (70.9 kg)   BMI 30.51 kg/m  Last Weight:  Wt Readings from Last 1 Encounters:  06/06/16 156 lb 3.2 oz (70.9 kg)   Last Height:  Ht Readings from Last 1 Encounters:  03/29/16 5' (1.524 m)   Exam: Gen: NAD, not conversant                  CV: RRR, no MRG. No Carotid Bruits. No peripheral edema, warm, nontender Eyes: Conjunctivae clear without exudates or hemorrhage  Neuro: Detailed Neurologic Exam  Speech:    Speech is normal; fluent, not spontaneous with impaired comprehension.  Cognition:  MMSE - Mini Mental State Exam 06/06/2016 01/03/2016  Not completed: - (No Data)  Orientation to time 1 -  Orientation to Place 2 -  Registration 3 -  Attention/ Calculation 0 -  Recall 0 -  Language- name 2 objects 2 -  Language- repeat 0 -  Language- follow 3 step command 3 -  Language- read & follow direction 1 -  Write a sentence 1 -  Copy design 0 -  Total score 13 -      The patient is oriented to person;     recent and remote memory impaired;     language fluent;     Impaired attention, concentration,     fund of knowledge impaired Cranial Nerves:    The pupils are equal, round, and  reactive to light. Attempted funduscopic exam could not visualize due to small pupils. Visual fields are full to finger confrontation. Extraocular movements are intact. Trigeminal sensation is intact and the muscles of mastication are normal. The face is symmetric. The palate elevates in the midline. Hearing intact. Voice is normal. Shoulder shrug is normal. The tongue has normal motion without fasciculations.   Coordination:   No dysmetria  Gait:   Patient walks with a cane with normal stride  Motor Observation:    No asymmetry, no atrophy, and no involuntary movements noted. Tone:    Normal muscle tone.    Posture:    Posture is normal.    Strength: Bilateral mild hip flexion weakness otherwise strength is V/V in the upper and lower limbs.      Sensation: intact to LT     Reflex Exam:  DTR's: Absent ankle jerks otherwise deep tendon reflexes in the upper and lower extremities are normal bilaterally.   Toes:    The toes are equivocal bilaterally.   Clonus:    Clonus is absent.      Assessment/Plan:  82 year old female here for multiple complaints. Patient has a history of dementia, hypertension, hyperlipidemia. MRI of the brain showed intracranial large and small vessel atherosclerosis and new cerebellar infarct likely due to atherosclerosis. Also Aneurysm 7 mm fusiform RIGHT carotid  terminus aneurysm, similar to slightly increased from 2013. Here with daughters who provide most information.  - Continue Plavix - Patient has a carotid terminus aneurysm 7 mm as compared to 6 mm 4 years ago. At a long discussion with patient and her daughters, I offered to refer them for cerebral angiogram and evaluation of stenting or coiling however even patient's age of 65 and her comorbidities including dementia and the risks of this procedure family decided to monitor and control blood pressure. - I had a long d/w patient about her recent stroke, risk for recurrent stroke/TIAs,  personally independently reviewed imaging studies and stroke evaluation results and answered questions. StartPlavix for secondary stroke prevention and maintain strict control of hypertension with blood pressure goal below 130/90, diabetes with hemoglobin A1c goal below 6.5% and lipids with LDL cholesterol goal below 70 mg/dL.Patient has h/o statin intolerance hence recommend new PCSK9 inhibitor like Praluent.  Recommend she see her primary MD to get prescription. I also advised the patient to eat a healthy diet with plenty of whole grains, cereals, fruits and vegetables, exercise regularly and maintain ideal body weight . - Patient appears to be having alterations of consciousness that would be seizures. I do recommend starting antiepileptic medications however family declines. EEG and prolonged EEG without epileptiform activity. - Dementia: MMSE 13, moderate to advanced dementia. Discussed, provided information and where to go for Alzheimer's info such as Alzheimer's foundation online . Continue Aricept. Start memantine. Patient should not be driving anymore. She should have 24x7 caretaker.  - They can't afford the Ariecpt 23mg , feel the benefit is negligible at this point in increasing the aricept, can go back to 10mg  daily - Namenda 10mg  daily and in one month no side effects increase to 10mg  twice daily - Needs home PT due to dementia and gait abnormality with falls. Needs skilled nursing due to medication changes as well as blood pressure checks and disease process teaching if possible. She is having increased difficulty walking, transferring and performing her ADLs and worsening dementia. She is a fall risk, offered to send PT at her home due to her disability and inability to leave the home unassisted. She also needs nursing for teaching of disease process,  blood pressure monitoring as it has been elevated and running high. Home inspection with evaluation of fall risks would also be helpful, she has  fallen. She has dementia and worsening confusion.   Cc: Dr. Modena Morrow, MD  [facility] [address] [place] [employer], [place] [address]  Phone [phone] Fax [phone]  A total of 40 minutes was spent face-to-face with this patient. Over half this time was spent on counseling patient on the dementia, gait abnormality diagnosis and different diagnostic and therapeutic options available.

## 2016-06-21 ENCOUNTER — Other Ambulatory Visit: Payer: Self-pay | Admitting: Family Medicine

## 2016-08-03 DIAGNOSIS — I672 Cerebral atherosclerosis: Secondary | ICD-10-CM | POA: Diagnosis not present

## 2016-08-03 DIAGNOSIS — M810 Age-related osteoporosis without current pathological fracture: Secondary | ICD-10-CM | POA: Diagnosis not present

## 2016-08-03 DIAGNOSIS — F0390 Unspecified dementia without behavioral disturbance: Secondary | ICD-10-CM | POA: Diagnosis not present

## 2016-08-03 DIAGNOSIS — M6281 Muscle weakness (generalized): Secondary | ICD-10-CM | POA: Diagnosis not present

## 2016-08-03 DIAGNOSIS — R269 Unspecified abnormalities of gait and mobility: Secondary | ICD-10-CM | POA: Diagnosis not present

## 2016-08-03 DIAGNOSIS — I10 Essential (primary) hypertension: Secondary | ICD-10-CM | POA: Diagnosis not present

## 2016-08-03 DIAGNOSIS — Z7982 Long term (current) use of aspirin: Secondary | ICD-10-CM | POA: Diagnosis not present

## 2016-08-03 DIAGNOSIS — Z9181 History of falling: Secondary | ICD-10-CM | POA: Diagnosis not present

## 2016-08-06 ENCOUNTER — Telehealth: Payer: Self-pay | Admitting: Neurology

## 2016-08-06 ENCOUNTER — Telehealth: Payer: Self-pay

## 2016-08-06 DIAGNOSIS — M6281 Muscle weakness (generalized): Secondary | ICD-10-CM | POA: Diagnosis not present

## 2016-08-06 DIAGNOSIS — I672 Cerebral atherosclerosis: Secondary | ICD-10-CM | POA: Diagnosis not present

## 2016-08-06 DIAGNOSIS — F0390 Unspecified dementia without behavioral disturbance: Secondary | ICD-10-CM | POA: Diagnosis not present

## 2016-08-06 DIAGNOSIS — R269 Unspecified abnormalities of gait and mobility: Secondary | ICD-10-CM | POA: Diagnosis not present

## 2016-08-06 DIAGNOSIS — M810 Age-related osteoporosis without current pathological fracture: Secondary | ICD-10-CM | POA: Diagnosis not present

## 2016-08-06 DIAGNOSIS — I10 Essential (primary) hypertension: Secondary | ICD-10-CM | POA: Diagnosis not present

## 2016-08-06 NOTE — Telephone Encounter (Signed)
If possible- could family bring her in for a visit with me ?- I noted her bp was also high at her last neuro visit (but fine when I saw her last)  Do watch sodium  Thanks

## 2016-08-06 NOTE — Telephone Encounter (Signed)
Left voicemail with Dr. Marliss Coots comments and request that Elmyra Ricks call back and schedule an appt if possible

## 2016-08-06 NOTE — Telephone Encounter (Signed)
Returned call to [place], PT w/ [facility], and gave verbal order for PT 2x/wk for 5 wks. May call back if any questions/concerns.

## 2016-08-06 NOTE — Telephone Encounter (Signed)
Elmyra Ricks nurse with [facility] left v/m; today pts BP was 170/94. Pt was not symptomatic. Other visits BP was 170/88. If diastolic is greater than 90 has to call PCP. Elmyra Ricks is asking family to start a food journal to see if that will help see how much salt pt is taking in.

## 2016-08-06 NOTE — Telephone Encounter (Signed)
Monique (physical therapist) with [facility] home [facility] called to get a verbal for patient to being Physical therapy.  2 times weekly for 5 weeks working on strengthening, gait balance, and safely precautions.  Please call

## 2016-08-09 ENCOUNTER — Ambulatory Visit (INDEPENDENT_AMBULATORY_CARE_PROVIDER_SITE_OTHER): Payer: Medicare Other | Admitting: Family Medicine

## 2016-08-09 ENCOUNTER — Encounter: Payer: Self-pay | Admitting: Family Medicine

## 2016-08-09 VITALS — BP 128/80 | HR 62 | Temp 97.5°F | Ht 60.0 in | Wt 158.5 lb

## 2016-08-09 DIAGNOSIS — I10 Essential (primary) hypertension: Secondary | ICD-10-CM | POA: Diagnosis not present

## 2016-08-09 DIAGNOSIS — M810 Age-related osteoporosis without current pathological fracture: Secondary | ICD-10-CM | POA: Diagnosis not present

## 2016-08-09 DIAGNOSIS — M6281 Muscle weakness (generalized): Secondary | ICD-10-CM | POA: Diagnosis not present

## 2016-08-09 DIAGNOSIS — I639 Cerebral infarction, unspecified: Secondary | ICD-10-CM

## 2016-08-09 DIAGNOSIS — I720 Aneurysm of carotid artery: Secondary | ICD-10-CM | POA: Diagnosis not present

## 2016-08-09 DIAGNOSIS — F0150 Vascular dementia without behavioral disturbance: Secondary | ICD-10-CM | POA: Diagnosis not present

## 2016-08-09 DIAGNOSIS — Z23 Encounter for immunization: Secondary | ICD-10-CM

## 2016-08-09 DIAGNOSIS — I672 Cerebral atherosclerosis: Secondary | ICD-10-CM | POA: Diagnosis not present

## 2016-08-09 DIAGNOSIS — R269 Unspecified abnormalities of gait and mobility: Secondary | ICD-10-CM | POA: Diagnosis not present

## 2016-08-09 DIAGNOSIS — F0390 Unspecified dementia without behavioral disturbance: Secondary | ICD-10-CM | POA: Diagnosis not present

## 2016-08-09 NOTE — Assessment & Plan Note (Signed)
Seeing neurology  Getting bp under control  On plavix

## 2016-08-09 NOTE — Progress Notes (Signed)
Subjective:    Patient ID: Wendy Kr[facility], female    DOB: May 13, 1930, 83 y.o.   MRN: [id number]  HPI 83 yo female with dementia here for HTN  She also has a hx of cellebellar cva and carotid aneurysm   Was called on 2/6 by Elmyra Ricks at [facility] bp was 170s-80s-90s Decided to start watching sodium in diet  They were checking at different times  Used home cuff -still high 170/94 for example   ? If anxiety played into it   Trying to keep salt away from her  Has made family aware to start restricting salt  Wt Readings from Last 3 Encounters:  08/09/16 158 lb 8 oz (71.9 kg)  06/06/16 156 lb 3.2 oz (70.9 kg)  03/29/16 156 lb 6.4 oz (70.9 kg)   bmi is 30.9  bp is much improved today No cp or palpitations or headaches or edema - feels fine  No side effects to medicines  BP Readings from Last 3 Encounters:  08/09/16 128/80  06/06/16 (!) 186/92  03/29/16 136/71     On metoprolol 50 mg bid Lisinopril 20 mg hctz 25 mg daily   Also K   Pulse Readings from Last 3 Encounters:  08/09/16 62  06/06/16 63  03/29/16 60   Cannot advance the beta blocker     Chemistry      Component Value Date/Time   NA 140 11/03/2015 1137   K 3.8 11/03/2015 1137   CL 105 11/03/2015 1137   CO2 28 11/03/2015 1137   BUN 20 11/03/2015 1137   CREATININE 1.08 11/03/2015 1137      Component Value Date/Time   CALCIUM 10.1 11/03/2015 1137   ALKPHOS 61 09/19/2015 1202   AST 17 09/19/2015 1202   ALT 10 09/19/2015 1202   BILITOT 0.4 09/19/2015 1202       plavix 75 mg  No stroke symptoms at all   Dementia- moving to namenda from aricept  Getting that worked out with ins    Patient Active Problem List   Diagnosis Date Noted  . Cerebellar stroke ([place]) 03/10/2016  . Carotid aneurysm, right ([place]) 02/21/2016  . Syncope 02/21/2016  . Vasovagal episode 02/13/2016  . Lipoma of shoulder 03/27/2015  . History of closed Colles' fracture 09/06/2014  . Allergic rhinitis 06/20/2014  . Erosive  gastritis 04/28/2014  . Duodenitis with bleeding 04/28/2014  . Normocytic anemia 04/28/2014  . Diastolic dysfunction [id number]  . Melena 03/21/2014  . History of GI bleed 03/21/2014  . Unspecified gastritis and gastroduodenitis without mention of hemorrhage 03/21/2014  . Near syncope 03/19/2014  . Dementia 09/01/2013  . Memory loss 09/01/2013  . Anxiety 09/01/2013  . Encounter for Medicare annual wellness exam 01/08/2013  . Gallstones 11/11/2011  . 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 Left hand weakness 06/04/2011  . Risk for falls 06/04/2011  . Constipation 03/05/2011  . Vitamin D deficiency 03/28/2009  . HYPERCHOLESTEROLEMIA, PURE 03/17/2007  . Essential hypertension 03/16/2007  . OSTEOARTHRITIS 03/16/2007  . Osteoporosis 03/16/2007   Past Medical History:  Diagnosis Date  . Asymptomatic gallstones   . Colitis - presumed infectious origin    one ER visit   . Hyperlipidemia   . Hypertension   . Osteoarthritis   . Osteoporosis    Past Surgical History:  Procedure Laterality Date  . ABDOMINAL HYSTERECTOMY     BSO- fibroids  . ABI's     normal  . dexa  1/05   osteoporosis  . ESOPHAGOGASTRODUODENOSCOPY  N/A 03/21/2014   Procedure: ESOPHAGOGASTRODUODENOSCOPY (EGD);  Surgeon: Milus Banister, MD;  Location: [place];  Service: Endoscopy;  Laterality: N/A;  . left foot brace    . WRIST FRACTURE SURGERY  2/01   R arm   Social History  Substance Use Topics  . Smoking status: Never Smoker  . Smokeless tobacco: Never Used  . Alcohol use No   Family History  Problem Relation Age of Onset  . Heart attack Father   . Hypertension Father   . Heart attack Mother   . Throat cancer Brother    Allergies  Allergen Reactions  . Alendronate Sodium     Leg pain   . Amlodipine Besylate Hives  . Calcitonin ([place]) Other (See Comments)     headache/ head pressure  . Simvastatin Other (See Comments)    Leg pain    Current Outpatient Prescriptions on File  Prior to Visit  Medication Sig Dispense Refill  . Cholecalciferol (VITAMIN D-3) 1000 units CAPS Take 2 capsules by mouth daily.     . clopidogrel (PLAVIX) 75 MG tablet Take 1 tablet (75 mg total) by mouth daily. 30 tablet 11  . donepezil (ARICEPT) 10 MG tablet Take 1 tablet (10 mg total) by mouth at bedtime. 30 tablet 12  . Fexofenadine HCl (ALLERGY 24-HR PO) Take 1 capsule by mouth daily.    . hydrochlorothiazide (HYDRODIURIL) 25 MG tablet Take 1 tablet (25 mg total) by mouth daily. 90 tablet 3  . lisinopril (PRINIVIL,ZESTRIL) 20 MG tablet Take 1 tablet (20 mg total) by mouth daily. 90 tablet 3  . memantine (NAMENDA) 10 MG tablet Take 1 tablet (10 mg total) by mouth 2 (two) times daily. 60 tablet 12  . metoprolol (LOPRESSOR) 50 MG tablet TAKE 1 TABLET (50 MG TOTAL) BY MOUTH 2 (TWO) TIMES DAILY. 180 tablet 1  . Multiple Vitamins-Minerals (CVS SPECTRAVITE PO) Take 1 capsule by mouth daily.    . potassium chloride (KLOR-CON M10) 10 MEQ tablet Take 2 tablets (20 mEq total) by mouth daily. 180 tablet 3  . vitamin B-12 (CYANOCOBALAMIN) 1000 MCG tablet Take 1,000 mcg by mouth daily.     No current facility-administered medications on file prior to visit.     Review of Systems Review of Systems  Constitutional: Negative for fever, appetite change, fatigue and unexpected weight change.  Eyes: Negative for pain and visual disturbance.  Respiratory: Negative for cough and shortness of breath.   Cardiovascular: Negative for cp or palpitations    Gastrointestinal: Negative for nausea, diarrhea and constipation.  Genitourinary: Negative for urgency and frequency.  Skin: Negative for pallor or rash   Neurological: Negative for weakness, light-headedness, numbness and headaches.  Hematological: Negative for adenopathy. Does not bruise/bleed easily.  Psychiatric/Behavioral: Negative for dysphoric mood. The patient is not nervous/anxious.  pos for dementia that is progressive        Objective:    Physical Exam  Constitutional: She appears well-developed and well-nourished. No distress.  Well appearing elderly female with dementia   HENT:  Head: Normocephalic and atraumatic.  Mouth/Throat: Oropharynx is clear and moist.  Eyes: Conjunctivae and EOM are normal. Pupils are equal, round, and reactive to light.  Neck: Normal range of motion. Neck supple. No JVD present. Carotid bruit is not present. No thyromegaly present.  Cardiovascular: Normal rate, regular rhythm, normal heart sounds and intact distal pulses.  Exam reveals no gallop.   Pulmonary/Chest: Effort normal and breath sounds normal. No respiratory distress. She has no wheezes. She  has no rales.  No crackles  Abdominal: Soft. Bowel sounds are normal. She exhibits no distension, no abdominal bruit and no mass. There is no tenderness.  Musculoskeletal: She exhibits no edema.  Lymphadenopathy:    She has no cervical adenopathy.  Neurological: She is alert. She has normal reflexes.  Skin: Skin is warm and dry. No rash noted.  Psychiatric: She has a normal mood and affect.  Baseline dementia Pleasant -not agitated , and repeats phrases periodically          Assessment & Plan:   Problem List Items Addressed This Visit      Cardiovascular and Mediastinum   Carotid aneurysm, right ([place])    No changes Working on bp control  Sees neuro       Cerebellar stroke [facility])    Seeing neurology  Getting bp under control  On plavix        Essential hypertension - Primary    Running high at home with either manual or auto cuff  However excellent here in both arms ? If anx related Also suspect she may not be taking all of her medicine  Family will start watching her take all med in light of dementia  Also handout provided with DASH eating plan  Continue HH and neuro f/u  Will update if bp goes back up despite meds Am hesitant to inc anything with hx of syncope in the past but we also have stroke risk         Nervous  and Auditory   Dementia    Seeing neuro On aricept and namenda  Family will start watching her take her medications regularly (? If dropping meds)         Other Visit Diagnoses    Need for influenza vaccination       Relevant Orders   Flu Vaccine QUAD 36+ mos IM (Completed)

## 2016-08-09 NOTE — Assessment & Plan Note (Signed)
No changes Working on bp Counselling psychologist

## 2016-08-09 NOTE — Patient Instructions (Addendum)
Flu shot today  Blood pressure is excellent!    120/75-80  In both arms  Perhaps she is missing some medication doses From now on - watch her take her medicines Keep me posted   Schedule fasting labs in may or June   Take a look at the handout for [facility]

## 2016-08-09 NOTE — Assessment & Plan Note (Signed)
Running high at home with either manual or auto cuff  However excellent here in both arms ? If anx related Also suspect she may not be taking all of her medicine  Family will start watching her take all med in light of dementia  Also handout provided with DASH eating plan  Continue HH and neuro f/u  Will update if bp goes back up despite meds Am hesitant to inc anything with hx of syncope in the past but we also have stroke risk

## 2016-08-09 NOTE — Progress Notes (Signed)
Pre visit review using our clinic review tool, if applicable. No additional management support is needed unless otherwise documented below in the visit note. 

## 2016-08-09 NOTE — Assessment & Plan Note (Signed)
Seeing neuro On aricept and namenda  Family will start watching her take her medications regularly (? If dropping meds)

## 2016-08-13 DIAGNOSIS — I10 Essential (primary) hypertension: Secondary | ICD-10-CM | POA: Diagnosis not present

## 2016-08-13 DIAGNOSIS — F0390 Unspecified dementia without behavioral disturbance: Secondary | ICD-10-CM | POA: Diagnosis not present

## 2016-08-13 DIAGNOSIS — M6281 Muscle weakness (generalized): Secondary | ICD-10-CM | POA: Diagnosis not present

## 2016-08-13 DIAGNOSIS — M810 Age-related osteoporosis without current pathological fracture: Secondary | ICD-10-CM | POA: Diagnosis not present

## 2016-08-13 DIAGNOSIS — R269 Unspecified abnormalities of gait and mobility: Secondary | ICD-10-CM | POA: Diagnosis not present

## 2016-08-13 DIAGNOSIS — I672 Cerebral atherosclerosis: Secondary | ICD-10-CM | POA: Diagnosis not present

## 2016-08-16 DIAGNOSIS — F0390 Unspecified dementia without behavioral disturbance: Secondary | ICD-10-CM | POA: Diagnosis not present

## 2016-08-16 DIAGNOSIS — R269 Unspecified abnormalities of gait and mobility: Secondary | ICD-10-CM | POA: Diagnosis not present

## 2016-08-16 DIAGNOSIS — M810 Age-related osteoporosis without current pathological fracture: Secondary | ICD-10-CM | POA: Diagnosis not present

## 2016-08-16 DIAGNOSIS — I10 Essential (primary) hypertension: Secondary | ICD-10-CM | POA: Diagnosis not present

## 2016-08-16 DIAGNOSIS — M6281 Muscle weakness (generalized): Secondary | ICD-10-CM | POA: Diagnosis not present

## 2016-08-16 DIAGNOSIS — I672 Cerebral atherosclerosis: Secondary | ICD-10-CM | POA: Diagnosis not present

## 2016-08-18 DIAGNOSIS — R269 Unspecified abnormalities of gait and mobility: Secondary | ICD-10-CM | POA: Diagnosis not present

## 2016-08-18 DIAGNOSIS — M6281 Muscle weakness (generalized): Secondary | ICD-10-CM | POA: Diagnosis not present

## 2016-08-18 DIAGNOSIS — F0390 Unspecified dementia without behavioral disturbance: Secondary | ICD-10-CM | POA: Diagnosis not present

## 2016-08-18 DIAGNOSIS — I10 Essential (primary) hypertension: Secondary | ICD-10-CM | POA: Diagnosis not present

## 2016-08-18 DIAGNOSIS — M810 Age-related osteoporosis without current pathological fracture: Secondary | ICD-10-CM | POA: Diagnosis not present

## 2016-08-18 DIAGNOSIS — I672 Cerebral atherosclerosis: Secondary | ICD-10-CM | POA: Diagnosis not present

## 2016-08-20 DIAGNOSIS — I672 Cerebral atherosclerosis: Secondary | ICD-10-CM | POA: Diagnosis not present

## 2016-08-20 DIAGNOSIS — I10 Essential (primary) hypertension: Secondary | ICD-10-CM | POA: Diagnosis not present

## 2016-08-20 DIAGNOSIS — M6281 Muscle weakness (generalized): Secondary | ICD-10-CM | POA: Diagnosis not present

## 2016-08-20 DIAGNOSIS — F0390 Unspecified dementia without behavioral disturbance: Secondary | ICD-10-CM | POA: Diagnosis not present

## 2016-08-20 DIAGNOSIS — M810 Age-related osteoporosis without current pathological fracture: Secondary | ICD-10-CM | POA: Diagnosis not present

## 2016-08-20 DIAGNOSIS — R269 Unspecified abnormalities of gait and mobility: Secondary | ICD-10-CM | POA: Diagnosis not present

## 2016-08-21 DIAGNOSIS — R269 Unspecified abnormalities of gait and mobility: Secondary | ICD-10-CM | POA: Diagnosis not present

## 2016-08-21 DIAGNOSIS — M6281 Muscle weakness (generalized): Secondary | ICD-10-CM | POA: Diagnosis not present

## 2016-08-21 DIAGNOSIS — M810 Age-related osteoporosis without current pathological fracture: Secondary | ICD-10-CM | POA: Diagnosis not present

## 2016-08-21 DIAGNOSIS — I10 Essential (primary) hypertension: Secondary | ICD-10-CM | POA: Diagnosis not present

## 2016-08-21 DIAGNOSIS — I672 Cerebral atherosclerosis: Secondary | ICD-10-CM | POA: Diagnosis not present

## 2016-08-21 DIAGNOSIS — F0390 Unspecified dementia without behavioral disturbance: Secondary | ICD-10-CM | POA: Diagnosis not present

## 2016-08-23 DIAGNOSIS — I672 Cerebral atherosclerosis: Secondary | ICD-10-CM | POA: Diagnosis not present

## 2016-08-23 DIAGNOSIS — I10 Essential (primary) hypertension: Secondary | ICD-10-CM | POA: Diagnosis not present

## 2016-08-23 DIAGNOSIS — M810 Age-related osteoporosis without current pathological fracture: Secondary | ICD-10-CM | POA: Diagnosis not present

## 2016-08-23 DIAGNOSIS — R269 Unspecified abnormalities of gait and mobility: Secondary | ICD-10-CM | POA: Diagnosis not present

## 2016-08-23 DIAGNOSIS — F0390 Unspecified dementia without behavioral disturbance: Secondary | ICD-10-CM | POA: Diagnosis not present

## 2016-08-23 DIAGNOSIS — M6281 Muscle weakness (generalized): Secondary | ICD-10-CM | POA: Diagnosis not present

## 2016-08-26 DIAGNOSIS — M6281 Muscle weakness (generalized): Secondary | ICD-10-CM | POA: Diagnosis not present

## 2016-08-26 DIAGNOSIS — M810 Age-related osteoporosis without current pathological fracture: Secondary | ICD-10-CM | POA: Diagnosis not present

## 2016-08-26 DIAGNOSIS — I672 Cerebral atherosclerosis: Secondary | ICD-10-CM | POA: Diagnosis not present

## 2016-08-26 DIAGNOSIS — R269 Unspecified abnormalities of gait and mobility: Secondary | ICD-10-CM | POA: Diagnosis not present

## 2016-08-26 DIAGNOSIS — I10 Essential (primary) hypertension: Secondary | ICD-10-CM | POA: Diagnosis not present

## 2016-08-26 DIAGNOSIS — F0390 Unspecified dementia without behavioral disturbance: Secondary | ICD-10-CM | POA: Diagnosis not present

## 2016-08-28 DIAGNOSIS — M6281 Muscle weakness (generalized): Secondary | ICD-10-CM | POA: Diagnosis not present

## 2016-08-28 DIAGNOSIS — M810 Age-related osteoporosis without current pathological fracture: Secondary | ICD-10-CM | POA: Diagnosis not present

## 2016-08-28 DIAGNOSIS — R269 Unspecified abnormalities of gait and mobility: Secondary | ICD-10-CM | POA: Diagnosis not present

## 2016-08-28 DIAGNOSIS — I10 Essential (primary) hypertension: Secondary | ICD-10-CM | POA: Diagnosis not present

## 2016-08-28 DIAGNOSIS — I672 Cerebral atherosclerosis: Secondary | ICD-10-CM | POA: Diagnosis not present

## 2016-08-28 DIAGNOSIS — F0390 Unspecified dementia without behavioral disturbance: Secondary | ICD-10-CM | POA: Diagnosis not present

## 2016-08-29 DIAGNOSIS — M6281 Muscle weakness (generalized): Secondary | ICD-10-CM | POA: Diagnosis not present

## 2016-08-29 DIAGNOSIS — M810 Age-related osteoporosis without current pathological fracture: Secondary | ICD-10-CM | POA: Diagnosis not present

## 2016-08-29 DIAGNOSIS — F0390 Unspecified dementia without behavioral disturbance: Secondary | ICD-10-CM | POA: Diagnosis not present

## 2016-08-29 DIAGNOSIS — R269 Unspecified abnormalities of gait and mobility: Secondary | ICD-10-CM | POA: Diagnosis not present

## 2016-08-29 DIAGNOSIS — I672 Cerebral atherosclerosis: Secondary | ICD-10-CM | POA: Diagnosis not present

## 2016-08-29 DIAGNOSIS — I10 Essential (primary) hypertension: Secondary | ICD-10-CM | POA: Diagnosis not present

## 2016-09-03 DIAGNOSIS — R269 Unspecified abnormalities of gait and mobility: Secondary | ICD-10-CM | POA: Diagnosis not present

## 2016-09-03 DIAGNOSIS — I10 Essential (primary) hypertension: Secondary | ICD-10-CM | POA: Diagnosis not present

## 2016-09-03 DIAGNOSIS — I672 Cerebral atherosclerosis: Secondary | ICD-10-CM | POA: Diagnosis not present

## 2016-09-03 DIAGNOSIS — M810 Age-related osteoporosis without current pathological fracture: Secondary | ICD-10-CM | POA: Diagnosis not present

## 2016-09-03 DIAGNOSIS — M6281 Muscle weakness (generalized): Secondary | ICD-10-CM | POA: Diagnosis not present

## 2016-09-03 DIAGNOSIS — F0390 Unspecified dementia without behavioral disturbance: Secondary | ICD-10-CM | POA: Diagnosis not present

## 2016-09-06 DIAGNOSIS — F0390 Unspecified dementia without behavioral disturbance: Secondary | ICD-10-CM | POA: Diagnosis not present

## 2016-09-06 DIAGNOSIS — I10 Essential (primary) hypertension: Secondary | ICD-10-CM | POA: Diagnosis not present

## 2016-09-06 DIAGNOSIS — I672 Cerebral atherosclerosis: Secondary | ICD-10-CM | POA: Diagnosis not present

## 2016-09-06 DIAGNOSIS — M810 Age-related osteoporosis without current pathological fracture: Secondary | ICD-10-CM | POA: Diagnosis not present

## 2016-09-06 DIAGNOSIS — R269 Unspecified abnormalities of gait and mobility: Secondary | ICD-10-CM | POA: Diagnosis not present

## 2016-09-06 DIAGNOSIS — M6281 Muscle weakness (generalized): Secondary | ICD-10-CM | POA: Diagnosis not present

## 2016-09-10 NOTE — Telephone Encounter (Signed)
Received Episode Summary Report from [facility]. Pt met goals and d/c'd to home/self care on 09/06/16.

## 2016-10-09 ENCOUNTER — Other Ambulatory Visit: Payer: Self-pay | Admitting: Family Medicine

## 2016-10-09 DIAGNOSIS — Z1231 Encounter for screening mammogram for malignant neoplasm of breast: Secondary | ICD-10-CM

## 2016-10-15 ENCOUNTER — Other Ambulatory Visit: Payer: Self-pay | Admitting: Family Medicine

## 2016-11-14 ENCOUNTER — Ambulatory Visit
Admission: RE | Admit: 2016-11-14 | Discharge: 2016-11-14 | Disposition: A | Discharge status: Home / Self Care | Payer: Medicare Other | Source: Ambulatory Visit | Attending: Family Medicine | Admitting: Family Medicine

## 2016-11-14 DIAGNOSIS — Z1231 Encounter for screening mammogram for malignant neoplasm of breast: Secondary | ICD-10-CM

## 2016-11-17 ENCOUNTER — Telehealth: Payer: Self-pay | Admitting: Family Medicine

## 2016-11-17 DIAGNOSIS — D649 Anemia, unspecified: Secondary | ICD-10-CM

## 2016-11-17 DIAGNOSIS — E559 Vitamin D deficiency, unspecified: Secondary | ICD-10-CM

## 2016-11-17 DIAGNOSIS — E7800 Pure hypercholesterolemia, unspecified: Secondary | ICD-10-CM

## 2016-11-17 NOTE — Telephone Encounter (Signed)
-----   Message from Ellamae Sia sent at 11/12/2016  3:07 PM EDT ----- Regarding: Lab orders for Friday, 5.25.18 Lab orders for a 3 month follow up appt.

## 2016-11-18 ENCOUNTER — Encounter: Payer: Self-pay | Admitting: *Deleted

## 2016-11-22 ENCOUNTER — Other Ambulatory Visit (INDEPENDENT_AMBULATORY_CARE_PROVIDER_SITE_OTHER): Payer: Medicare Other

## 2016-11-22 DIAGNOSIS — E559 Vitamin D deficiency, unspecified: Secondary | ICD-10-CM | POA: Diagnosis not present

## 2016-11-22 DIAGNOSIS — E7800 Pure hypercholesterolemia, unspecified: Secondary | ICD-10-CM | POA: Diagnosis not present

## 2016-11-22 DIAGNOSIS — D649 Anemia, unspecified: Secondary | ICD-10-CM | POA: Diagnosis not present

## 2016-11-22 LAB — CBC WITH DIFFERENTIAL/PLATELET
Basophils Absolute: 0 10*3/uL (ref 0.0–0.1)
Basophils Relative: 0.7 % (ref 0.0–3.0)
EOS PCT: 2.3 % (ref 0.0–5.0)
Eosinophils Absolute: 0.1 10*3/uL (ref 0.0–0.7)
HEMATOCRIT: 40.2 % (ref 36.0–46.0)
Hemoglobin: 13.4 g/dL (ref 12.0–15.0)
LYMPHS ABS: 1.4 10*3/uL (ref 0.7–4.0)
LYMPHS PCT: 27.7 % (ref 12.0–46.0)
MCHC: 33.4 g/dL (ref 30.0–36.0)
MCV: 92.7 fl (ref 78.0–100.0)
MONOS PCT: 7.9 % (ref 3.0–12.0)
Monocytes Absolute: 0.4 10*3/uL (ref 0.1–1.0)
NEUTROS ABS: 3.1 10*3/uL (ref 1.4–7.7)
NEUTROS PCT: 61.4 % (ref 43.0–77.0)
Platelets: 192 10*3/uL (ref 150.0–400.0)
RBC: 4.34 Mil/uL (ref 3.87–5.11)
RDW: 13.4 % (ref 11.5–15.5)
WBC: 5.1 10*3/uL (ref 4.0–10.5)

## 2016-11-22 LAB — COMPREHENSIVE METABOLIC PANEL
ALBUMIN: 4.1 g/dL (ref 3.5–5.2)
ALK PHOS: 62 U/L (ref 39–117)
ALT: 11 U/L (ref 0–35)
AST: 25 U/L (ref 0–37)
BUN: 18 mg/dL (ref 6–23)
CALCIUM: 9.9 mg/dL (ref 8.4–10.5)
CO2: 27 mEq/L (ref 19–32)
Chloride: 104 mEq/L (ref 96–112)
Creatinine, Ser: 1.2 mg/dL (ref 0.40–1.20)
GFR: 45.37 mL/min — AB (ref >60.00)
GLUCOSE: 84 mg/dL (ref 70–99)
POTASSIUM: 4 meq/L (ref 3.5–5.1)
Sodium: 140 mEq/L (ref 135–145)
TOTAL PROTEIN: 6.8 g/dL (ref 6.0–8.3)
Total Bilirubin: 0.5 mg/dL (ref 0.2–1.2)

## 2016-11-22 LAB — LIPID PANEL
CHOLESTEROL: 315 mg/dL — AB (ref 0–200)
HDL: 52.8 mg/dL (ref >39.00)
NonHDL: 262.11
TRIGLYCERIDES: 284 mg/dL — AB (ref 0.0–149.0)
Total CHOL/HDL Ratio: 6
VLDL: 56.8 mg/dL — ABNORMAL HIGH (ref 0.0–40.0)

## 2016-11-22 LAB — LDL CHOLESTEROL, DIRECT: Direct LDL: 182 mg/dL

## 2016-11-22 LAB — VITAMIN D 25 HYDROXY (VIT D DEFICIENCY, FRACTURES): VITD: 31.2 ng/mL (ref 30.00–100.00)

## 2016-11-26 ENCOUNTER — Encounter: Payer: Self-pay | Admitting: *Deleted

## 2016-12-05 ENCOUNTER — Ambulatory Visit (INDEPENDENT_AMBULATORY_CARE_PROVIDER_SITE_OTHER): Payer: Medicare Other | Admitting: Adult Health

## 2016-12-05 ENCOUNTER — Encounter: Payer: Self-pay | Admitting: Adult Health

## 2016-12-05 VITALS — BP 151/95 | HR 64 | Ht 60.0 in | Wt 163.8 lb

## 2016-12-05 DIAGNOSIS — F0390 Unspecified dementia without behavioral disturbance: Secondary | ICD-10-CM

## 2016-12-05 DIAGNOSIS — I639 Cerebral infarction, unspecified: Secondary | ICD-10-CM

## 2016-12-05 DIAGNOSIS — R419 Unspecified symptoms and signs involving cognitive functions and awareness: Secondary | ICD-10-CM | POA: Diagnosis not present

## 2016-12-05 NOTE — Progress Notes (Signed)
Wendy Krueger: Wendy Krueger DOB: [birth year]  REASON FOR VISIT: follow up- dementia, episodes of alteration of awareness HISTORY FROM: Wendy Krueger  HISTORY OF PRESENT ILLNESS: Wendy Krueger is an 83 year old female with a history of dementia and episodes of alteration of awareness. She returns today for follow-up. Her Wendy Krueger is with her today and reports that she has not had any additional episodes of alteration of awareness. The Wendy Krueger currently lives with her Wendy Krueger. She does require some assistance with ADLs. She no longer cooks meals. She does not operate a motor vehicle. Her Wendy Krueger handles her finances. Denies any trouble sleeping. Her Wendy Krueger reports that she has started biting her nails particularly the thumb and index finger. She  Completed physical therapy and Wendy Krueger reports while they were there she did great. Family has a a hard time getting her to cooperate with him. Physical therapy suggested that she uses a walker but Wendy Krueger reports that the Wendy Krueger refuses to use a walker and uses a cane instead. They deny any new neurological symptoms. Returns today for an evaluation.   06/06/16 Interval history:  Routine and prolonged EEG were normal. Wendy Krueger provides most information. No more episodes of alteration of awareness. She is having increased difficulty walking, transferring and performing her ADLs and worsening dementia. She is a fall risk, offered to send PT at her home due to her disability and inability to leave the home unassisted. She also needs nursing for teaching of disease process,  blood pressure monitoring as it has been elevated and running high. Home inspection with evaluation of fall risks would also be helpful, she has fallen. She has dementia and worsening confusion.   Routine EEG: Normal 72 hour ambulatory EEG: This was a normal prolonged ambulatory 72-hour video EEG. No epileptiform discharges seen. No electrographic or electroclinical events present. There was  no focal or background slowing seen. There were no push button events. Owing to this prolonged VEEG being normal, other, non-epileptic causes should be considered for this Wendy Krueger's symptoms".  WIO:XBDZHGD C [name] a 83 y.o.femalehere as a referral from Dr. Duanne Limerick stroke and seizure. Wendy Krueger are here and they provide most information. She has a past medical history of osteoporosis, hypertension, hyperlipidemia. She has had multiple episodes of altered awareness with confusion afterwards. Wendy Krueger and Wendy Krueger were at a beach in the shade. They noticed Wendy Krueger was getting pale looking. She leaned back, her eyes rolled back and she was pale and jerking.They thought she had a urinary tract infection. But then turns out she did not. She was not dehydrated. Wendy Krueger does not remember the episode. She has dementia. Both arms jerked then she came to and they gave her some water.Episode lasted a few minutes. No inciting events or head trauma. No previous illnesses. She had a similar episode on the porch a previous where her eyes rolled back and she jerked her arms.A fewdays beforehand she got up in the morning after having slept late and she didn;t recognize anyone. She was confused , took some time for her to come back to baseline, paramedics came. This happened Thursday August 10th, she woke up she was not answering questions appropriately, she kept saying the same word. Memory changes started years ago and are slowly progressive. She gets up sometines at night and starts packing. Unknown triggers for these alterations of consciousness, no other associated symptoms, no urination or defecation. No inciting events or head trauma. No family history of seizures. Wendy Krueger herself has no history of seizures as far as family  and Wendy Krueger know.   Reviewed notes, labs and imaging from outside physicians, which showed:   Personally reviewed images of the brain and agree with following: MRI HEAD: No acute  intracranial process.  New small LEFT cerebellar infarcts, otherwise stable examination including moderate chronic small vessel ischemic disease.  MRA HEAD: Motion degraded examination. 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 Possible tiny basilar tip aneurysm.  Poor flow related enhancement of the LEFT internal carotid artery, posterior cerebral arteries bilaterally, likely motion artifact.  Severe stenosis RIGHT M1 segment, moderate stenosis distal LEFT M1 segment though these may be overestimated by motion artifact.  As clinically indicated, findings would be better characterized on CTA HEAD.   REVIEW OF SYSTEMS: Out of a complete 14 system review of symptoms, the Wendy Krueger complains only of the following symptoms, and all other reviewed systems are negative.  See history of present illness  ALLERGIES: Allergies  Allergen Reactions  . Alendronate Sodium     Leg pain   . Amlodipine Besylate Hives  . Calcitonin ([place]) Other (See Comments)     headache/ head pressure  . Simvastatin Other (See Comments)    Leg pain     HOME MEDICATIONS: Outpatient Medications Prior to Visit  Medication Sig Dispense Refill  . Cholecalciferol (VITAMIN D-3) 1000 units CAPS Take 2 capsules by mouth daily.     . clopidogrel (PLAVIX) 75 MG tablet Take 1 tablet (75 mg total) by mouth daily. 30 tablet 11  . donepezil (ARICEPT) 10 MG tablet Take 1 tablet (10 mg total) by mouth at bedtime. 30 tablet 12  . Fexofenadine HCl (ALLERGY 24-HR PO) Take 1 capsule by mouth daily.    . hydrochlorothiazide (HYDRODIURIL) 25 MG tablet Take 1 tablet (25 mg total) by mouth daily. 90 tablet 3  . KLOR-CON M10 10 MEQ tablet TAKE 2 TABLETS (20 MEQ TOTAL) BY MOUTH DAILY. 180 tablet 1  . lisinopril (PRINIVIL,ZESTRIL) 20 MG tablet Take 1 tablet (20 mg total) by mouth daily. 90 tablet 3  . memantine (NAMENDA) 10 MG tablet Take 1 tablet (10 mg total) by mouth 2 (two) times daily. 60  tablet 12  . metoprolol (LOPRESSOR) 50 MG tablet TAKE 1 TABLET (50 MG TOTAL) BY MOUTH 2 (TWO) TIMES DAILY. 180 tablet 1  . Multiple Vitamins-Minerals ([facility] SPECTRAVITE PO) Take 1 capsule by mouth daily.    . vitamin B-12 (CYANOCOBALAMIN) 1000 MCG tablet Take 1,000 mcg by mouth daily.     No facility-administered medications prior to visit.     PAST MEDICAL HISTORY: Past Medical History:  Diagnosis Date  . Asymptomatic gallstones   . Colitis - presumed infectious origin    one ER visit   . Hyperlipidemia   . Hypertension   . Osteoarthritis   . Osteoporosis     PAST SURGICAL HISTORY: Past Surgical History:  Procedure Laterality Date  . ABDOMINAL HYSTERECTOMY     BSO- fibroids  . ABI's     normal  . dexa  1/05   osteoporosis  . ESOPHAGOGASTRODUODENOSCOPY N/A 03/21/2014   Procedure: ESOPHAGOGASTRODUODENOSCOPY (EGD);  Surgeon: Milus Banister, MD;  Location: [facility];  Service: Endoscopy;  Laterality: N/A;  . left foot brace    . WRIST FRACTURE SURGERY  2/01   R arm    FAMILY HISTORY: Family History  Problem Relation Age of Onset  . Heart attack Father   . Hypertension Father   . Heart attack Mother   . Throat cancer Brother  SOCIAL HISTORY: Social History   Social History  . Marital status: Widowed    Spouse name: N/A  . Number of children: 5  . Years of education: 28   Occupational History  . retired Retired   Social History Main Topics  . Smoking status: Never Smoker  . Smokeless tobacco: Never Used  . Alcohol use No  . Drug use: No  . Sexual activity: No   Other Topics Concern  . Not on file   Social History Narrative   Has 4 brothers, 1 sister   Lives with 2 teenage grand kids and her Wendy Krueger   Has 4 Wendy Krueger, 1 son      PHYSICAL EXAM  Vitals:   12/05/16 1051  BP: (!) 151/95  Pulse: 64  Weight: 163 lb 12.8 oz (74.3 kg)  Height: 5' (1.524 m)   Body mass index is 31.99 kg/m.  Generalized: Well developed, in no acute distress     Neurological examination  Mentation: Alert oriented to time, place, history taking. Follows all commands speech and language fluent Cranial nerve II-XII: Pupils were equal round reactive to light. Extraocular movements were full, visual field were full on confrontational test. Facial sensation and strength were normal. Uvula tongue midline. Head turning and shoulder shrug  were normal and symmetric. Motor: The motor testing reveals 5 over 5 strength of all 4 extremities. Good symmetric motor tone is noted throughout.  Sensory: Sensory testing is intact to soft touch on all 4 extremities. No evidence of extinction is noted.  Coordination: Cerebellar testing reveals good finger-nose-finger and heel-to-shin bilaterally.  Gait and station: Gait is normal. Tandem gait is normal. Romberg is negative. No drift is seen.  Reflexes: Deep tendon reflexes are symmetric and normal bilaterally.   DIAGNOSTIC DATA (LABS, IMAGING, TESTING) - I reviewed Wendy Krueger records, labs, notes, testing and imaging myself where available.  Lab Results  Component Value Date   WBC 5.1 11/22/2016   HGB 13.4 11/22/2016   HCT 40.2 11/22/2016   MCV 92.7 11/22/2016   PLT 192.0 11/22/2016      Component Value Date/Time   NA 140 11/22/2016 0945   K 4.0 11/22/2016 0945   CL 104 11/22/2016 0945   CO2 27 11/22/2016 0945   GLUCOSE 84 11/22/2016 0945   BUN 18 11/22/2016 0945   CREATININE 1.20 11/22/2016 0945   CALCIUM 9.9 11/22/2016 0945   PROT 6.8 11/22/2016 0945   ALBUMIN 4.1 11/22/2016 0945   AST 25 11/22/2016 0945   ALT 11 11/22/2016 0945   ALKPHOS 62 11/22/2016 0945   BILITOT 0.5 11/22/2016 0945   GFRNONAA 54 (L) 03/21/2014 0655   GFRAA 63 (L) 03/21/2014 0655   Lab Results  Component Value Date   CHOL 315 (H) 11/22/2016   HDL 52.80 11/22/2016   LDLCALC 199 (H) 01/03/2014   LDLDIRECT 182.0 11/22/2016   TRIG 284.0 (H) 11/22/2016   CHOLHDL 6 11/22/2016   Lab Results  Component Value Date   HGBA1C 5.4  03/07/2016    Lab Results  Component Value Date   TSH 1.47 09/19/2015      ASSESSMENT AND PLAN 83 y.o. year old female  has a past medical history of Asymptomatic gallstones; Colitis - presumed infectious origin; Hyperlipidemia; Hypertension; Osteoarthritis; and Osteoporosis. here with:  1. Dementia 2. Alteration of awareness  The Wendy Krueger's memory score has remained stable. The Wendy Krueger will continue on Aricept and Namenda. She fortunately has not had any additional episodes of alteration of awareness. We will continue to  monitor. Wendy Krueger and her Wendy Krueger advised that if her symptoms worsen or she develops new symptoms they should et [place] know. She will follow-up in 6 months or sooner if needed.    Ward Givens, MSN, NP-C 12/05/2016, 10:46 AM [facility] [address] [place], [place] [address] [phone]

## 2016-12-05 NOTE — Patient Instructions (Signed)
Continue Aricept and Namenda  Memory score is stable If your symptoms worsen or you develop new symptoms please let us know.   

## 2016-12-21 ENCOUNTER — Other Ambulatory Visit: Payer: Self-pay | Admitting: Family Medicine

## 2017-01-11 NOTE — Progress Notes (Signed)
Personally  participated in, made any corrections needed, and agree with history, physical, neuro exam,assessment and plan as stated.     Jerrico Covello, MD [facility]     

## 2017-02-01 ENCOUNTER — Other Ambulatory Visit: Payer: Self-pay | Admitting: Family Medicine

## 2017-02-15 DIAGNOSIS — H2513 Age-related nuclear cataract, bilateral: Secondary | ICD-10-CM | POA: Diagnosis not present

## 2017-02-21 ENCOUNTER — Encounter: Payer: Self-pay | Admitting: Family Medicine

## 2017-02-21 ENCOUNTER — Ambulatory Visit (INDEPENDENT_AMBULATORY_CARE_PROVIDER_SITE_OTHER): Payer: Medicare Other | Admitting: Family Medicine

## 2017-02-21 VITALS — BP 160/82 | HR 65 | Temp 97.8°F | Resp 16 | Ht 60.0 in | Wt 167.1 lb

## 2017-02-21 DIAGNOSIS — I10 Essential (primary) hypertension: Secondary | ICD-10-CM | POA: Diagnosis not present

## 2017-02-21 DIAGNOSIS — R410 Disorientation, unspecified: Secondary | ICD-10-CM

## 2017-02-21 DIAGNOSIS — N3000 Acute cystitis without hematuria: Secondary | ICD-10-CM | POA: Diagnosis not present

## 2017-02-21 DIAGNOSIS — I639 Cerebral infarction, unspecified: Secondary | ICD-10-CM | POA: Diagnosis not present

## 2017-02-21 DIAGNOSIS — F0150 Vascular dementia without behavioral disturbance: Secondary | ICD-10-CM | POA: Diagnosis not present

## 2017-02-21 DIAGNOSIS — N390 Urinary tract infection, site not specified: Secondary | ICD-10-CM | POA: Insufficient documentation

## 2017-02-21 LAB — POCT URINALYSIS DIPSTICK
Bilirubin, UA: NEGATIVE
Blood, UA: NEGATIVE
Glucose, UA: NEGATIVE
Ketones, UA: NEGATIVE
NITRITE UA: NEGATIVE
PROTEIN UA: NEGATIVE
Spec Grav, UA: >=1.03 — AB (ref 1.010–1.025)

## 2017-02-21 MED ORDER — SULFAMETHOXAZOLE-TRIMETHOPRIM 800-160 MG PO TABS: 1.0000 | ORAL_TABLET | Freq: Two times a day (BID) | ORAL | 0 refills | Status: DC

## 2017-02-21 NOTE — Patient Instructions (Signed)
Take the septra DS for a uti  Encourage water We will call with the culture result when it returns  Keep [place] posted if no improvement in symptoms

## 2017-02-21 NOTE — Progress Notes (Signed)
Subjective:    Patient ID: Wendy Krueger, female    DOB: July 31, 1929, 83 y.o.   MRN: [id number]  HPI Here for mental status change   She has been hallucinating (audio/vis) Claims she saw girls in her bed  This was yesterday am  Last night - claimed she was looking for something but felt like she could not name the word she wanted    Slower speech   Went back to bed until 11:00   Family is caring for her   Not urinating more frequently  occ incontinence  Denies dysuria  No change in odor  No blood in urine  Has not c/o of back or flank pain   Results for orders placed or performed in visit on 02/21/17  Urine culture  Result Value Ref Range   Organism ID, Bacteria      Multiple organisms present,each less than 10,000 CFU/mL. These organisms,commonly found on external and internal genitalia,are considered colonizers. No further testing performed.   POC Urinalysis Dipstick  Result Value Ref Range   Color, UA straw    Clarity, UA cloudy    Glucose, UA Negative    Bilirubin, UA negative    Ketones, UA Negative    Spec Grav, UA >=1.030 (A) 1.010 - 1.025   Blood, UA Negative    pH, UA  5.0 - 8.0   Protein, UA Negative    Urobilinogen, UA  0.2 or 1.0 E.U./dL   Nitrite, UA Negative    Leukocytes, UA Large (3+) (A) Negative     No fever  No n/v Appetite is not great lately   Nose runs all the time  No cough  No wounds     Baseline has dementia  On aricept and namenda   BP Readings from Last 3 Encounters:  02/21/17 (!) 160/82  12/05/16 (!) 151/95  08/09/16 128/80    Patient Active Problem List   Diagnosis Date Noted  . UTI (urinary tract infection) 02/21/2017  . Cerebellar stroke ([place]) 03/10/2016  . Carotid aneurysm, right ([place]) 02/21/2016  . Syncope 02/21/2016  . Vasovagal episode 02/13/2016  . Lipoma of shoulder 03/27/2015  . History of closed Colles' fracture 09/06/2014  . Allergic rhinitis 06/20/2014  . Erosive gastritis 04/28/2014  .  Duodenitis with bleeding 04/28/2014  . Normocytic anemia 04/28/2014  . Diastolic dysfunction 40/98/1191  . Melena 03/21/2014  . History of GI bleed 03/21/2014  . Unspecified gastritis and gastroduodenitis without mention of hemorrhage 03/21/2014  . Near syncope 03/19/2014  . Dementia 09/01/2013  . Memory loss 09/01/2013  . Anxiety 09/01/2013  . Encounter for Medicare annual wellness exam 01/08/2013  . Gallstones 11/11/2011  . 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 Left hand weakness 06/04/2011  . Risk for falls 06/04/2011  . Constipation 03/05/2011  . Vitamin D deficiency 03/28/2009  . HYPERCHOLESTEROLEMIA, PURE 03/17/2007  . Essential hypertension 03/16/2007  . OSTEOARTHRITIS 03/16/2007  . Osteoporosis 03/16/2007   Past Medical History:  Diagnosis Date  . Asymptomatic gallstones   . Colitis - presumed infectious origin    one ER visit   . Hyperlipidemia   . Hypertension   . Osteoarthritis   . Osteoporosis    Past Surgical History:  Procedure Laterality Date  . ABDOMINAL HYSTERECTOMY     BSO- fibroids  . ABI's     normal  . dexa  1/05   osteoporosis  . ESOPHAGOGASTRODUODENOSCOPY N/A 03/21/2014   Procedure: ESOPHAGOGASTRODUODENOSCOPY (EGD);  Surgeon: Milus Banister, MD;  Location:  [facility];  Service: Endoscopy;  Laterality: N/A;  . left foot brace    . WRIST FRACTURE SURGERY  2/01   R arm   Social History  Substance Use Topics  . Smoking status: Never Smoker  . Smokeless tobacco: Never Used  . Alcohol use No   Family History  Problem Relation Age of Onset  . Heart attack Father   . Hypertension Father   . Heart attack Mother   . Throat cancer Brother    Allergies  Allergen Reactions  . Alendronate Sodium     Leg pain   . Amlodipine Besylate Hives  . Calcitonin ([place]) Other (See Comments)     headache/ head pressure  . Simvastatin Other (See Comments)    Leg pain    Current Outpatient Prescriptions on File Prior to Visit  Medication  Sig Dispense Refill  . Cholecalciferol (VITAMIN D-3) 1000 units CAPS Take 2 capsules by mouth daily.     . clopidogrel (PLAVIX) 75 MG tablet Take 1 tablet (75 mg total) by mouth daily. 30 tablet 11  . donepezil (ARICEPT) 10 MG tablet Take 1 tablet (10 mg total) by mouth at bedtime. 30 tablet 12  . Fexofenadine HCl (ALLERGY 24-HR PO) Take 1 capsule by mouth daily.    . hydrochlorothiazide (HYDRODIURIL) 25 MG tablet TAKE 1 TABLET (25 MG TOTAL) BY MOUTH DAILY. 90 tablet 1  . KLOR-CON M10 10 MEQ tablet TAKE 2 TABLETS (20 MEQ TOTAL) BY MOUTH DAILY. 180 tablet 1  . lisinopril (PRINIVIL,ZESTRIL) 20 MG tablet TAKE 1 TABLET (20 MG TOTAL) BY MOUTH DAILY. 90 tablet 1  . memantine (NAMENDA) 10 MG tablet Take 1 tablet (10 mg total) by mouth 2 (two) times daily. 60 tablet 12  . metoprolol (LOPRESSOR) 50 MG tablet TAKE 1 TABLET (50 MG TOTAL) BY MOUTH 2 (TWO) TIMES DAILY. 180 tablet 1  . Multiple Vitamins-Minerals ([facility] SPECTRAVITE PO) Take 1 capsule by mouth daily.    . vitamin B-12 (CYANOCOBALAMIN) 1000 MCG tablet Take 1,000 mcg by mouth daily.     No current facility-administered medications on file prior to visit.     Review of Systems Review of Systems  Constitutional: Negative for fever, appetite change,  and unexpected weight change.  Eyes: Negative for pain and visual disturbance.  Respiratory: Negative for cough and shortness of breath.   Cardiovascular: Negative for cp or palpitations    Gastrointestinal: Negative for nausea, diarrhea and constipation.  Genitourinary: pos for urgency and frequency. (occ incont-baseline) Skin: Negative for pallor or rash   Neurological: Negative for weakness, light-headedness, numbness and headaches.  Hematological: Negative for adenopathy. Does not bruise/bleed easily.  Psychiatric/Behavioral: Negative for dysphoric mood. The patient is sometimes anxious and agitated , pos for dementia  Pos for vis/aud hallucinations recently          Objective:   Physical  Exam  Constitutional: She appears well-developed and well-nourished. No distress.  Obese and frail appearing  HENT:  Head: Normocephalic and atraumatic.  Mouth/Throat: Oropharynx is clear and moist.  Eyes: Pupils are equal, round, and reactive to light. Conjunctivae and EOM are normal. No scleral icterus.  Neck: Normal range of motion. Neck supple. No JVD present. No thyromegaly present.  Cardiovascular: Normal rate and regular rhythm.   Pulmonary/Chest: Effort normal and breath sounds normal. No respiratory distress. She has no wheezes. She has no rales. She exhibits no tenderness.  Abdominal: Soft. Bowel sounds are normal. She exhibits no distension and no mass. There is no tenderness.  There is no rebound and no guarding.  No suprapubic tenderness or fullness  No cva tenderness   Musculoskeletal: She exhibits no edema.  Lymphadenopathy:    She has no cervical adenopathy.  Neurological: She is alert. No cranial nerve deficit. Coordination normal.  Skin: Skin is warm and dry. No pallor.  Psychiatric: Her mood appears anxious. Her speech is tangential. She is not agitated, not aggressive, not hyperactive, not slowed and not withdrawn. Cognition and memory are impaired. She exhibits abnormal recent memory.  Dementia noted  Worse confusion today  Laughs frequently and repeats herself           Assessment & Plan:   Problem List Items Addressed This Visit      Cardiovascular and Mediastinum   Essential hypertension    bp in fair control at this time (but higher in the office)  Need to watch this She is confused and somewhat anxious today BP Readings from Last 1 Encounters:  02/21/17 (!) 160/82   No changes needed Disc lifstyle change with low sodium diet and exercise (as able)  Will re check this at next visit         Nervous and Auditory   Dementia    Slowly progressing on aricept and namenda  Worse the past several days (possibly due to uti) with some vis/aud  hallucinations  This could also be sundowning  tx uti /pend cx  Continue f/u with neuro  Has good care and no safety concerns right now         Genitourinary   UTI (urinary tract infection)    Pos ua today-pend cx tx with septra ds  Enc water intake  Update if no imp in mental status/ confusion      Relevant Medications   sulfamethoxazole-trimethoprim (BACTRIM DS,SEPTRA DS) 800-160 MG tablet    Other Visit Diagnoses    Confusion    -  Primary   Relevant Orders   POC Urinalysis Dipstick (Completed)   Urine culture (Completed)

## 2017-02-22 LAB — URINE CULTURE

## 2017-02-23 NOTE — Assessment & Plan Note (Signed)
Slowly progressing on aricept and namenda  Worse the past several days (possibly due to uti) with some vis/aud hallucinations  This could also be sundowning  tx uti /pend cx  Continue f/u with neuro  Has good care and no safety concerns right now

## 2017-02-23 NOTE — Assessment & Plan Note (Signed)
Pos ua today-pend cx tx with septra ds  Enc water intake  Update if no imp in mental status/ confusion

## 2017-02-23 NOTE — Assessment & Plan Note (Signed)
bp in fair control at this time (but higher in the office)  Need to watch this She is confused and somewhat anxious today BP Readings from Last 1 Encounters:  02/21/17 (!) 160/82   No changes needed Disc lifstyle change with low sodium diet and exercise (as able)  Will re check this at next visit

## 2017-02-27 ENCOUNTER — Other Ambulatory Visit: Payer: Self-pay | Admitting: Neurology

## 2017-02-27 DIAGNOSIS — I6381 Other cerebral infarction due to occlusion or stenosis of small artery: Secondary | ICD-10-CM

## 2017-03-18 ENCOUNTER — Other Ambulatory Visit: Payer: Self-pay | Admitting: Family Medicine

## 2017-03-26 MED ORDER — METOPROLOL TARTRATE 50 MG PO TABS: ORAL_TABLET | ORAL | 0 refills | Status: DC

## 2017-03-26 NOTE — Telephone Encounter (Signed)
[name] DPR signed said that [facility] did not get refill on metoprolol; I spoke with Verdis Frederickson at [facility] and they did not get refill on 03/18/17.Medication phoned to [facility] at [place] as instructed. [name] voiced understanding and will ck with sisters about pts last CPX.

## 2017-03-26 NOTE — Addendum Note (Signed)
Addended by: Helene Shoe on: 03/26/2017 01:23 PM   Modules accepted: Orders

## 2017-04-06 ENCOUNTER — Telehealth: Payer: Self-pay | Admitting: Family Medicine

## 2017-04-06 DIAGNOSIS — I10 Essential (primary) hypertension: Secondary | ICD-10-CM

## 2017-04-06 DIAGNOSIS — E559 Vitamin D deficiency, unspecified: Secondary | ICD-10-CM

## 2017-04-06 DIAGNOSIS — E7800 Pure hypercholesterolemia, unspecified: Secondary | ICD-10-CM

## 2017-04-06 NOTE — Telephone Encounter (Signed)
-----   Message from Eustace Pen, LPN sent at 47/08/719 11:49 AM EDT ----- Regarding: Labs 10/10 Lab orders needed. Thank you.  Insurance: [employer]

## 2017-04-09 ENCOUNTER — Ambulatory Visit (INDEPENDENT_AMBULATORY_CARE_PROVIDER_SITE_OTHER): Payer: Medicare Other

## 2017-04-09 VITALS — BP 132/86 | HR 61 | Temp 97.4°F | Ht 60.0 in | Wt 165.2 lb

## 2017-04-09 DIAGNOSIS — I10 Essential (primary) hypertension: Secondary | ICD-10-CM

## 2017-04-09 DIAGNOSIS — Z23 Encounter for immunization: Secondary | ICD-10-CM | POA: Diagnosis not present

## 2017-04-09 DIAGNOSIS — Z0000 Encounter for general adult medical examination without abnormal findings: Secondary | ICD-10-CM

## 2017-04-09 DIAGNOSIS — E559 Vitamin D deficiency, unspecified: Secondary | ICD-10-CM | POA: Diagnosis not present

## 2017-04-09 DIAGNOSIS — E7800 Pure hypercholesterolemia, unspecified: Secondary | ICD-10-CM | POA: Diagnosis not present

## 2017-04-09 LAB — TSH: TSH: 1.87 u[IU]/mL (ref 0.35–4.50)

## 2017-04-09 LAB — CBC WITH DIFFERENTIAL/PLATELET
Basophils Absolute: 0 10*3/uL (ref 0.0–0.1)
Basophils Relative: 0.7 % (ref 0.0–3.0)
EOS PCT: 2 % (ref 0.0–5.0)
Eosinophils Absolute: 0.1 10*3/uL (ref 0.0–0.7)
HEMATOCRIT: 43.3 % (ref 36.0–46.0)
Hemoglobin: 14.4 g/dL (ref 12.0–15.0)
LYMPHS ABS: 1.7 10*3/uL (ref 0.7–4.0)
LYMPHS PCT: 34 % (ref 12.0–46.0)
MCHC: 33.2 g/dL (ref 30.0–36.0)
MCV: 93.6 fl (ref 78.0–100.0)
MONOS PCT: 8.8 % (ref 3.0–12.0)
Monocytes Absolute: 0.4 10*3/uL (ref 0.1–1.0)
NEUTROS ABS: 2.7 10*3/uL (ref 1.4–7.7)
NEUTROS PCT: 54.5 % (ref 43.0–77.0)
PLATELETS: 183 10*3/uL (ref 150.0–400.0)
RBC: 4.63 Mil/uL (ref 3.87–5.11)
RDW: 13.5 % (ref 11.5–15.5)
WBC: 4.9 10*3/uL (ref 4.0–10.5)

## 2017-04-09 LAB — LIPID PANEL
Cholesterol: 344 mg/dL — ABNORMAL HIGH (ref 0–200)
HDL: 51.8 mg/dL (ref >39.00)
NonHDL: 292.42
Total CHOL/HDL Ratio: 7
Triglycerides: 375 mg/dL — ABNORMAL HIGH (ref 0.0–149.0)
VLDL: 75 mg/dL — AB (ref 0.0–40.0)

## 2017-04-09 LAB — COMPREHENSIVE METABOLIC PANEL
ALK PHOS: 61 U/L (ref 39–117)
ALT: 13 U/L (ref 0–35)
AST: 21 U/L (ref 0–37)
Albumin: 4.3 g/dL (ref 3.5–5.2)
BILIRUBIN TOTAL: 0.5 mg/dL (ref 0.2–1.2)
BUN: 15 mg/dL (ref 6–23)
CALCIUM: 10.7 mg/dL — AB (ref 8.4–10.5)
CO2: 29 mEq/L (ref 19–32)
Chloride: 102 mEq/L (ref 96–112)
Creatinine, Ser: 1.19 mg/dL (ref 0.40–1.20)
GFR: 45.77 mL/min — AB (ref >60.00)
GLUCOSE: 98 mg/dL (ref 70–99)
POTASSIUM: 4.8 meq/L (ref 3.5–5.1)
Sodium: 139 mEq/L (ref 135–145)
TOTAL PROTEIN: 7.4 g/dL (ref 6.0–8.3)

## 2017-04-09 LAB — VITAMIN D 25 HYDROXY (VIT D DEFICIENCY, FRACTURES): VITD: 36.68 ng/mL (ref 30.00–100.00)

## 2017-04-09 LAB — LDL CHOLESTEROL, DIRECT: Direct LDL: 210 mg/dL

## 2017-04-09 NOTE — Progress Notes (Signed)
Subjective:   Wendy Krueger is a 83 y.o. female who presents for Medicare Annual (Subsequent) preventive examination.  Review of Systems:  N/A Cardiac Risk Factors include: advanced age (>64men, >28 women);obesity (BMI >30kg/m2);hypertension     Objective:     Vitals: BP 132/86 (BP Location: Right Arm, Patient Position: Sitting, Cuff Size: Normal)   Pulse 61   Temp (!) 97.4 F (36.3 C) (Oral)   Ht 5' (1.524 m)   Wt 165 lb 4 oz (75 kg)   SpO2 99%   BMI 32.27 kg/m   Body mass index is 32.27 kg/m.   Tobacco History  Smoking Status  . Never Smoker  Smokeless Tobacco  . Never Used     Counseling given: No   Past Medical History:  Diagnosis Date  . Asymptomatic gallstones   . Colitis - presumed infectious origin    one ER visit   . Hyperlipidemia   . Hypertension   . Osteoarthritis   . Osteoporosis    Past Surgical History:  Procedure Laterality Date  . ABDOMINAL HYSTERECTOMY     BSO- fibroids  . ABI's     normal  . dexa  1/05   osteoporosis  . ESOPHAGOGASTRODUODENOSCOPY N/A 03/21/2014   Procedure: ESOPHAGOGASTRODUODENOSCOPY (EGD);  Surgeon: Milus Banister, MD;  Location: [place];  Service: Endoscopy;  Laterality: N/A;  . left foot brace    . WRIST FRACTURE SURGERY  2/01   R arm   Family History  Problem Relation Age of Onset  . Heart attack Father   . Hypertension Father   . Heart attack Mother   . Throat cancer Brother    History  Sexual Activity  . Sexual activity: No    Outpatient Encounter Prescriptions as of 04/09/2017  Medication Sig  . Cholecalciferol (VITAMIN D-3) 1000 units CAPS Take 2 capsules by mouth daily.   . clopidogrel (PLAVIX) 75 MG tablet TAKE 1 TABLET BY MOUTH EVERY DAY  . donepezil (ARICEPT) 10 MG tablet Take 1 tablet (10 mg total) by mouth at bedtime.  Marland Kitchen Fexofenadine HCl (ALLERGY 24-HR PO) Take 1 capsule by mouth daily.  . hydrochlorothiazide (HYDRODIURIL) 25 MG tablet TAKE 1 TABLET (25 MG TOTAL) BY MOUTH DAILY.    Marland Kitchen KLOR-CON M10 10 MEQ tablet TAKE 2 TABLETS (20 MEQ TOTAL) BY MOUTH DAILY.  Marland Kitchen lisinopril (PRINIVIL,ZESTRIL) 20 MG tablet TAKE 1 TABLET (20 MG TOTAL) BY MOUTH DAILY.  . memantine (NAMENDA) 10 MG tablet Take 1 tablet (10 mg total) by mouth 2 (two) times daily.  . metoprolol tartrate (LOPRESSOR) 50 MG tablet TAKE 1 TABLET (50 MG TOTAL) BY MOUTH 2 (TWO) TIMES DAILY.  . Multiple Vitamins-Minerals ([employer] SPECTRAVITE PO) Take 1 capsule by mouth daily.  Marland Kitchen sulfamethoxazole-trimethoprim (BACTRIM DS,SEPTRA DS) 800-160 MG tablet Take 1 tablet by mouth 2 (two) times daily.  . vitamin B-12 (CYANOCOBALAMIN) 1000 MCG tablet Take 1,000 mcg by mouth daily.   No facility-administered encounter medications on file as of 04/09/2017.     Activities of Daily Living In your present state of health, do you have any difficulty performing the following activities: 04/09/2017  Hearing? N  Vision? N  Difficulty concentrating or making decisions? Y  Walking or climbing stairs? Y  Dressing or bathing? Y  Doing errands, shopping? Y  Preparing Food and eating ? Y  Using the Toilet? Y  In the past six months, have you accidently leaked urine? Y  Do you have problems with loss of bowel control? [name]  Managing your Medications? Y  Managing your Finances? Y  Housekeeping or managing your Housekeeping? Y  Some recent data might be hidden    Patient Care Team: Tower, Wynelle Fanny, MD as PCP - General Shirl Harris, OD as Referring Physician (Optometry)    Assessment:     Hearing Screening   125Hz  250Hz  500Hz  1000Hz  2000Hz  3000Hz  4000Hz  6000Hz  8000Hz   Right ear:   0 0 40  0    Left ear:   0 0 40  0    Vision Screening Comments: Last vision exam in August 2018 @ [facility]   Exercise Activities and Dietary recommendations Current Exercise Habits: The patient does not participate in regular exercise at present, Exercise limited by: orthopedic condition(s)  Goals    . Reduce sodium intake          Starting  04/09/2017, I will continue to monitor intake of sodium in diet. Daily intake should be less than 1500 mg.       Fall Risk Fall Risk  04/09/2017 02/21/2017 01/03/2016 01/03/2016 01/11/2014  Falls in the past year? Yes No No No Yes  Comment pt fell backwards after trying to sit down in a chair - - - -  Number falls in past yr: 1 - - - 1  Injury with Fall? No - - - No  Risk for fall due to : - History of fall(s) - - -   Depression Screen PHQ 2/9 Scores 04/09/2017 02/21/2017 01/03/2016 01/03/2016  PHQ - 2 Score 0 2 0 0  PHQ- 9 Score 0 10 - -     Cognitive Function MMSE - Mini Mental State Exam 04/09/2017 12/05/2016 12/05/2016 06/06/2016 01/03/2016  Not completed: (No Data) - - - (No Data)  Orientation to time - - 1 1 -  Orientation to Place - - 1 2 -  Registration - - 3 3 -  Attention/ Calculation - - 0 0 -  Recall - - 0 0 -  Language- name 2 objects - - 2 2 -  Language- repeat - - 0 0 -  Language- follow 3 step command - - 2 3 -  Language- read & follow direction - - 1 1 -  Write a sentence - 1 1 1  -  Copy design - 1 1 0 -  Total score - - 12 13 -     PLEASE NOTE: A Mini-Cog screen was not completed due to dx of Dementia and Memory Loss.   Immunization History  Administered Date(s) Administered  . Influenza Split 06/04/2011, 07/10/2012  . Influenza,inj,Quad PF,6+ Mos 07/12/2014, 08/09/2016, 04/09/2017  . Pneumococcal Conjugate-13 01/11/2014  . Pneumococcal Polysaccharide-23 06/04/2011  . Td 08/27/2004   Screening Tests Health Maintenance  Topic Date Due  . TETANUS/TDAP  08/26/2024 (Originally 08/27/2014)  . MAMMOGRAM  11/14/2017  . INFLUENZA VACCINE  Completed  . DEXA SCAN  Completed  . PNA vac Low Risk Adult  Completed      Plan:     I have personally reviewed and addressed the Medicare Annual Wellness questionnaire and have noted the following in the patient's chart:  A. Medical and social history B. Use of alcohol, tobacco or illicit drugs  C. Current medications and  supplements D. Functional ability and status E.  Nutritional status F.  Physical activity G. Advance directives H. List of other physicians I.  Hospitalizations, surgeries, and ER visits in previous 12 months J.  [place] to include hearing, vision, cognitive, depression L.  Referrals and appointments - none  In addition, I have reviewed and discussed with patient certain preventive protocols, quality metrics, and best practice recommendations. A written personalized care plan for preventive services as well as general preventive health recommendations were provided to patient.  See attached scanned questionnaire for additional information.   Signed,   Lindell Noe, MHA, BS, LPN Health Coach

## 2017-04-09 NOTE — Progress Notes (Signed)
Pre visit review using our clinic review tool, if applicable. No additional management support is needed unless otherwise documented below in the visit note. 

## 2017-04-09 NOTE — Progress Notes (Signed)
PCP notes:   Health maintenance:  Tetanus - postponed/insurance Flu vaccine - administered  Abnormal screenings:   Fall risk - hx of fall without injury Hearing - failed  Hearing Screening   125Hz  250Hz  500Hz  1000Hz  2000Hz  3000Hz  4000Hz  6000Hz  8000Hz   Right ear:   0 0 40  0    Left ear:   0 0 40  0     Patient concerns:   None  Nurse concerns:  None  Next PCP appt:   04/16/17 @ 1430  I reviewed health advisor's note, was available for consultation, and agree with documentation and plan. Loura Pardon MD

## 2017-04-09 NOTE — Patient Instructions (Signed)
Wendy Krueger , Thank you for taking time to come for your Medicare Wellness Visit. I appreciate your ongoing commitment to your health goals. Please review the following plan we discussed and let me know if I can assist you in the future.   These are the goals we discussed: Goals    . Reduce sodium intake          Starting 04/09/2017, I will continue to monitor intake of sodium in diet. Daily intake should be less than 1500 mg.        This is a list of the screening recommended for you and due dates:  Health Maintenance  Topic Date Due  . Tetanus Vaccine  08/26/2024*  . Mammogram  11/14/2017  . Flu Shot  Completed  . DEXA scan (bone density measurement)  Completed  . Pneumonia vaccines  Completed  *Topic was postponed. The date shown is not the original due date.   Preventive Care for Adults  A healthy lifestyle and preventive care can promote health and wellness. Preventive health guidelines for adults include the following key practices.  . A routine yearly physical is a good way to check with your health care provider about your health and preventive screening. It is a chance to share any concerns and updates on your health and to receive a thorough exam.  . Visit your dentist for a routine exam and preventive care every 6 months. Brush your teeth twice a day and floss once a day. Good oral hygiene prevents tooth decay and gum disease.  . The frequency of eye exams is based on your age, health, family medical history, use  of contact lenses, and other factors. Follow your health care provider's ecommendations for frequency of eye exams.  . Eat a healthy diet. Foods like vegetables, fruits, whole grains, low-fat dairy products, and lean protein foods contain the nutrients you need without too many calories. Decrease your intake of foods high in solid fats, added sugars, and salt. Eat the right amount of calories for you. Get information about a proper diet from your health care  provider, if necessary.  . Regular physical exercise is one of the most important things you can do for your health. Most adults should get at least 150 minutes of moderate-intensity exercise (any activity that increases your heart rate and causes you to sweat) each week. In addition, most adults need muscle-strengthening exercises on 2 or more days a week.  Silver Sneakers may be a benefit available to you. To determine eligibility, you may visit the website: www.silversneakers.com or contact program at [phone] Mon-Fri between 8AM-8PM.   . Maintain a healthy weight. The body mass index (BMI) is a screening tool to identify possible weight problems. It provides an estimate of body fat based on height and weight. Your health care provider can find your BMI and can help you achieve or maintain a healthy weight.   For adults 20 years and older: ? A BMI below 18.5 is considered underweight. ? A BMI of 18.5 to 24.9 is normal. ? A BMI of 25 to 29.9 is considered overweight. ? A BMI of 30 and above is considered obese.   . Maintain normal blood lipids and cholesterol levels by exercising and minimizing your intake of saturated fat. Eat a balanced diet with plenty of fruit and vegetables. Blood tests for lipids and cholesterol should begin at age 29 and be repeated every 5 years. If your lipid or cholesterol levels are high, you are over  50, or you are at high risk for heart disease, you may need your cholesterol levels checked more frequently. Ongoing high lipid and cholesterol levels should be treated with medicines if diet and exercise are not working.  . If you smoke, find out from your health care provider how to quit. If you do not use tobacco, please do not start.  . If you choose to drink alcohol, please do not consume more than 2 drinks per day. One drink is considered to be 12 ounces (355 mL) of beer, 5 ounces (148 mL) of wine, or 1.5 ounces (44 mL) of liquor.  . If you are 46-79 years  old, ask your health care provider if you should take aspirin to prevent strokes.  . Use sunscreen. Apply sunscreen liberally and repeatedly throughout the day. You should seek shade when your shadow is shorter than you. Protect yourself by wearing long sleeves, pants, a wide-brimmed hat, and sunglasses year round, whenever you are outdoors.  . Once a month, do a whole body skin exam, using a mirror to look at the skin on your back. Tell your health care provider of new moles, moles that have irregular borders, moles that are larger than a pencil eraser, or moles that have changed in shape or color.

## 2017-04-15 ENCOUNTER — Other Ambulatory Visit: Payer: Self-pay | Admitting: Family Medicine

## 2017-04-16 ENCOUNTER — Ambulatory Visit (INDEPENDENT_AMBULATORY_CARE_PROVIDER_SITE_OTHER): Payer: Medicare Other | Admitting: Family Medicine

## 2017-04-16 ENCOUNTER — Encounter: Payer: Self-pay | Admitting: Family Medicine

## 2017-04-16 VITALS — BP 118/70 | HR 84 | Temp 98.3°F | Ht 60.0 in | Wt 164.5 lb

## 2017-04-16 DIAGNOSIS — I10 Essential (primary) hypertension: Secondary | ICD-10-CM

## 2017-04-16 DIAGNOSIS — Z9181 History of falling: Secondary | ICD-10-CM | POA: Diagnosis not present

## 2017-04-16 DIAGNOSIS — E559 Vitamin D deficiency, unspecified: Secondary | ICD-10-CM

## 2017-04-16 DIAGNOSIS — M810 Age-related osteoporosis without current pathological fracture: Secondary | ICD-10-CM | POA: Diagnosis not present

## 2017-04-16 DIAGNOSIS — F0150 Vascular dementia without behavioral disturbance: Secondary | ICD-10-CM

## 2017-04-16 DIAGNOSIS — I6381 Other cerebral infarction due to occlusion or stenosis of small artery: Secondary | ICD-10-CM

## 2017-04-16 DIAGNOSIS — E7800 Pure hypercholesterolemia, unspecified: Secondary | ICD-10-CM | POA: Diagnosis not present

## 2017-04-16 MED ORDER — POTASSIUM CHLORIDE CRYS ER 10 MEQ PO TBCR: 20.0000 meq | EXTENDED_RELEASE_TABLET | Freq: Every day | ORAL | 3 refills | Status: DC

## 2017-04-16 MED ORDER — LISINOPRIL 20 MG PO TABS: 20.0000 mg | ORAL_TABLET | Freq: Every day | ORAL | 3 refills | Status: DC

## 2017-04-16 MED ORDER — HYDROCHLOROTHIAZIDE 25 MG PO TABS: 25.0000 mg | ORAL_TABLET | Freq: Every day | ORAL | 3 refills | Status: DC

## 2017-04-16 MED ORDER — METOPROLOL TARTRATE 50 MG PO TABS: ORAL_TABLET | ORAL | 3 refills | Status: DC

## 2017-04-16 NOTE — Assessment & Plan Note (Signed)
bp in fair control at this time  BP Readings from Last 1 Encounters:  04/16/17 118/70   No changes needed Disc lifstyle change with low sodium diet and exercise  Labs reviewed

## 2017-04-16 NOTE — Assessment & Plan Note (Signed)
Uses walker  Rev fall precautions Family monitors closely  Has OP

## 2017-04-16 NOTE — Progress Notes (Signed)
Subjective:    Patient ID: Wendy Krueger, female    DOB: 07-24-1929, 83 y.o.   MRN: [id number]  HPI Here for f/u of chronic medial problems Nothing new going on   Wt Readings from Last 3 Encounters:  04/16/17 164 lb 8 oz (74.6 kg)  04/09/17 165 lb 4 oz (75 kg)  02/21/17 167 lb 1.9 oz (75.8 kg)   32.13 kg/m   amw 10/10 Failed hearing - she is not bothered by it  Declines hearing aides now  Had flu shot  Postponed tetanus shot due to cost   Mammogram 5/18- nl  Self breast exam   No gyn symptoms   dexa 1/10 OP D level 36.6- will inc dose to 3000 iu daily  Hx of colle's fx in the past  No recent falls Uses a walker   bp is stable today  No cp or palpitations or headaches or edema  No side effects to medicines  BP Readings from Last 3 Encounters:  04/16/17 118/70  04/09/17 132/86  02/21/17 (!) 160/82     Hx of CVA-on plavix  Also vascular dementia  On aricept and namenda  Sees neurology Some progression - wakes up confused at night occasionally   (occ hallucinates at night)  No safety concerns at all from family so far -they are caring for her  They have a plan for when things progress    Cholesterol Lab Results  Component Value Date   CHOL 344 (H) 04/09/2017   CHOL 315 (H) 11/22/2016   CHOL 303 (H) 09/19/2015   Lab Results  Component Value Date   HDL 51.80 04/09/2017   HDL 52.80 11/22/2016   HDL 53.70 09/19/2015   Lab Results  Component Value Date   LDLCALC 199 (H) 01/03/2014   LDLCALC 66 01/06/2011   LDLCALC 78 01/05/2011   Lab Results  Component Value Date   TRIG 375.0 (H) 04/09/2017   TRIG 284.0 (H) 11/22/2016   TRIG 253.0 (H) 09/19/2015   Lab Results  Component Value Date   CHOLHDL 7 04/09/2017   CHOLHDL 6 11/22/2016   CHOLHDL 6 09/19/2015   Lab Results  Component Value Date   LDLDIRECT 210.0 04/09/2017   LDLDIRECT 182.0 11/22/2016   LDLDIRECT 182.0 09/19/2015  intolerant of simvastatin  Declines further tx at her age    Balanced diet and watches the fats     Lab Results  Component Value Date   WBC 4.9 04/09/2017   HGB 14.4 04/09/2017   HCT 43.3 04/09/2017   MCV 93.6 04/09/2017   PLT 183.0 04/09/2017    Lab Results  Component Value Date   CREATININE 1.19 04/09/2017   BUN 15 04/09/2017   NA 139 04/09/2017   K 4.8 04/09/2017   CL 102 04/09/2017   CO2 29 04/09/2017    Lab Results  Component Value Date   ALT 13 04/09/2017   AST 21 04/09/2017   ALKPHOS 61 04/09/2017   BILITOT 0.5 04/09/2017    Lab Results  Component Value Date   TSH 1.87 04/09/2017    Glucose 98   Patient Active Problem List   Diagnosis Date Noted  . Cerebellar stroke ([place]) 03/10/2016  . Carotid aneurysm, right ([place]) 02/21/2016  . Syncope 02/21/2016  . Vasovagal episode 02/13/2016  . Lipoma of shoulder 03/27/2015  . History of closed Colles' fracture 09/06/2014  . Allergic rhinitis 06/20/2014  . Erosive gastritis 04/28/2014  . Duodenitis with bleeding 04/28/2014  . Normocytic anemia 04/28/2014  .  Diastolic dysfunction 61/44/3154  . Melena 03/21/2014  . History of GI bleed 03/21/2014  . Unspecified gastritis and gastroduodenitis without mention of hemorrhage 03/21/2014  . Near syncope 03/19/2014  . Dementia 09/01/2013  . Memory loss 09/01/2013  . Anxiety 09/01/2013  . Encounter for Medicare annual wellness exam 01/08/2013  . Gallstones 11/11/2011  . 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 Left hand weakness 06/04/2011  . Risk for falls 06/04/2011  . Constipation 03/05/2011  . Vitamin D deficiency 03/28/2009  . HYPERCHOLESTEROLEMIA, PURE 03/17/2007  . Essential hypertension 03/16/2007  . OSTEOARTHRITIS 03/16/2007  . Osteoporosis 03/16/2007   Past Medical History:  Diagnosis Date  . Asymptomatic gallstones   . Colitis - presumed infectious origin    one ER visit   . Hyperlipidemia   . Hypertension   . Osteoarthritis   . Osteoporosis    Past Surgical History:  Procedure Laterality Date  .  ABDOMINAL HYSTERECTOMY     BSO- fibroids  . ABI's     normal  . dexa  1/05   osteoporosis  . ESOPHAGOGASTRODUODENOSCOPY N/A 03/21/2014   Procedure: ESOPHAGOGASTRODUODENOSCOPY (EGD);  Surgeon: Milus Banister, MD;  Location: [place];  Service: Endoscopy;  Laterality: N/A;  . left foot brace    . WRIST FRACTURE SURGERY  2/01   R arm   Social History  Substance Use Topics  . Smoking status: Never Smoker  . Smokeless tobacco: Never Used  . Alcohol use No   Family History  Problem Relation Age of Onset  . Heart attack Father   . Hypertension Father   . Heart attack Mother   . Throat cancer Brother    Allergies  Allergen Reactions  . Alendronate Sodium     Leg pain   . Amlodipine Besylate Hives  . Calcitonin ([place]) Other (See Comments)     headache/ head pressure  . Simvastatin Other (See Comments)    Leg pain    Current Outpatient Prescriptions on File Prior to Visit  Medication Sig Dispense Refill  . Cholecalciferol (VITAMIN D-3) 1000 units CAPS Take 2 capsules by mouth daily.     . clopidogrel (PLAVIX) 75 MG tablet TAKE 1 TABLET BY MOUTH EVERY DAY 30 tablet 11  . donepezil (ARICEPT) 10 MG tablet Take 1 tablet (10 mg total) by mouth at bedtime. 30 tablet 12  . Fexofenadine HCl (ALLERGY 24-HR PO) Take 1 capsule by mouth daily.    . hydrochlorothiazide (HYDRODIURIL) 25 MG tablet TAKE 1 TABLET (25 MG TOTAL) BY MOUTH DAILY. 90 tablet 1  . KLOR-CON M10 10 MEQ tablet TAKE 2 TABLETS (20 MEQ TOTAL) BY MOUTH DAILY. 180 tablet 0  . lisinopril (PRINIVIL,ZESTRIL) 20 MG tablet TAKE 1 TABLET (20 MG TOTAL) BY MOUTH DAILY. 90 tablet 1  . memantine (NAMENDA) 10 MG tablet Take 1 tablet (10 mg total) by mouth 2 (two) times daily. 60 tablet 12  . metoprolol tartrate (LOPRESSOR) 50 MG tablet TAKE 1 TABLET (50 MG TOTAL) BY MOUTH 2 (TWO) TIMES DAILY. 180 tablet 0  . Multiple Vitamins-Minerals ([employer] SPECTRAVITE PO) Take 1 capsule by mouth daily.    . vitamin B-12 (CYANOCOBALAMIN) 1000 MCG  tablet Take 1,000 mcg by mouth daily.     No current facility-administered medications on file prior to visit.     Review of Systems  Constitutional: Positive for appetite change. Negative for activity change, fatigue, fever and unexpected weight change.  HENT: Negative for congestion, ear pain, rhinorrhea, sinus pressure and sore throat.   Eyes:  Negative for pain, redness and visual disturbance.  Respiratory: Negative for cough, shortness of breath and wheezing.   Cardiovascular: Negative for chest pain and palpitations.  Gastrointestinal: Negative for abdominal distention, abdominal pain, blood in stool, constipation and diarrhea.  Endocrine: Negative for polydipsia and polyuria.  Genitourinary: Negative for dysuria, frequency and urgency.  Musculoskeletal: Positive for arthralgias. Negative for back pain and myalgias.  Skin: Negative for pallor and rash.  Allergic/Immunologic: Negative for environmental allergies.  Neurological: Negative for dizziness, syncope and headaches.  Hematological: Negative for adenopathy. Does not bruise/bleed easily.  Psychiatric/Behavioral: Positive for confusion and hallucinations. Negative for decreased concentration and dysphoric mood. The patient is not nervous/anxious.        Positive for dementia that is progressive  occ confusion and hallucination at night        Objective:   Physical Exam  Constitutional: She appears well-developed and well-nourished. No distress.  Elderly obese female with dementia - pleasant   HENT:  Head: Normocephalic and atraumatic.  Right Ear: External ear normal.  Left Ear: External ear normal.  Mouth/Throat: Oropharynx is clear and moist.  Eyes: Pupils are equal, round, and reactive to light. Conjunctivae and EOM are normal. No scleral icterus.  Neck: Normal range of motion. Neck supple. No JVD present. Carotid bruit is not present. No thyromegaly present.  Cardiovascular: Normal rate, regular rhythm, normal heart  sounds and intact distal pulses.  Exam reveals no gallop.   Pulmonary/Chest: Effort normal and breath sounds normal. No respiratory distress. She has no wheezes. She exhibits no tenderness.  Abdominal: Soft. Bowel sounds are normal. She exhibits no distension, no abdominal bruit and no mass. There is no tenderness.  Genitourinary: No breast swelling, tenderness, discharge or bleeding.  Genitourinary Comments: Breast exam: No mass, nodules, thickening, tenderness, bulging, retraction, inflamation, nipple discharge or skin changes noted.  No axillary or clavicular LA.      Musculoskeletal: Normal range of motion. She exhibits no edema or tenderness.  Mild kyphosis   Lymphadenopathy:    She has no cervical adenopathy.  Neurological: She is alert. She has normal reflexes. No cranial nerve deficit. She exhibits normal muscle tone. Coordination normal.  Skin: Skin is warm and dry. No rash noted. No erythema. No pallor.  Large skin tag mid back   Few SKs   Psychiatric: She has a normal mood and affect. She is not agitated, not aggressive, not slowed, not withdrawn and not actively hallucinating. Cognition and memory are impaired. She exhibits abnormal recent memory.  Poor short term memory  Repeats self frequently  Not confused today  Content/pleasant           Assessment & Plan:   Problem List Items Addressed This Visit      Cardiovascular and Mediastinum   Essential hypertension - Primary    bp in fair control at this time  BP Readings from Last 1 Encounters:  04/16/17 118/70   No changes needed Disc lifstyle change with low sodium diet and exercise  Labs reviewed        Relevant Medications   metoprolol tartrate (LOPRESSOR) 50 MG tablet   lisinopril (PRINIVIL,ZESTRIL) 20 MG tablet   hydrochlorothiazide (HYDRODIURIL) 25 MG tablet     Nervous and Auditory   Dementia    Continues to slowly progress On airicept and namenda  Sees neurology  H/o cva  Currently mood is stable   Does have some confusion with occ hallucinations at night and family stays with her  No safety issues  voiced Continue to follow         Musculoskeletal and Integument   Osteoporosis    dexa 2010  Hx of arm fx in past  Declines tx or further dexa  Disc fall risk and precautions On ca and D          Other   HYPERCHOLESTEROLEMIA, PURE    Disc goals for lipids and reasons to control them Rev labs with pt Rev low sat fat diet in detail Intol of simvastatin  Declines further tx at her age  Hx of cva on plavix  Rev diet       Relevant Medications   metoprolol tartrate (LOPRESSOR) 50 MG tablet   lisinopril (PRINIVIL,ZESTRIL) 20 MG tablet   hydrochlorothiazide (HYDRODIURIL) 25 MG tablet   Risk for falls    Uses walker  Rev fall precautions Family monitors closely  Has OP      Vitamin D deficiency    Level of 36 Will inc dose to 3000 iu daily  Continue to watch  Disc imp to bone and overall health

## 2017-04-16 NOTE — Assessment & Plan Note (Signed)
dexa 2010  Hx of arm fx in past  Declines tx or further dexa  Disc fall risk and precautions On ca and D

## 2017-04-16 NOTE — Assessment & Plan Note (Signed)
Continues to slowly progress On airicept and namenda  Sees neurology  H/o cva  Currently mood is stable  Does have some confusion with occ hallucinations at night and family stays with her  No safety issues voiced Continue to follow

## 2017-04-16 NOTE — Assessment & Plan Note (Signed)
Level of 36 Will inc dose to 3000 iu daily  Continue to watch  Disc imp to bone and overall health

## 2017-04-16 NOTE — Assessment & Plan Note (Signed)
Disc goals for lipids and reasons to control them Rev labs with pt Rev low sat fat diet in detail Intol of simvastatin  Declines further tx at her age  Hx of cva on plavix  Rev diet

## 2017-04-16 NOTE — Patient Instructions (Addendum)
You are due for a tetanus shot - it may be covered in a pharmacy- see the handout   If tolerated - go up to 3000 iu of vitamin D daily   Blood pressure is good   Labs are stable   Stay safe  Use your walker  Sit outdoors in good weather

## 2017-05-06 ENCOUNTER — Emergency Department (HOSPITAL_COMMUNITY): Payer: Medicare Other

## 2017-05-06 ENCOUNTER — Encounter (HOSPITAL_COMMUNITY): Payer: Self-pay | Admitting: Emergency Medicine

## 2017-05-06 ENCOUNTER — Inpatient Hospital Stay (HOSPITAL_COMMUNITY)
Admission: EM | Admit: 2017-05-06 | Discharge: 2017-05-09 | DRG: 101 | Disposition: A | Discharge status: Home Health | Payer: Medicare Other | Attending: Internal Medicine | Admitting: Internal Medicine

## 2017-05-06 DIAGNOSIS — I720 Aneurysm of carotid artery: Secondary | ICD-10-CM | POA: Diagnosis not present

## 2017-05-06 DIAGNOSIS — S299XXA Unspecified injury of thorax, initial encounter: Secondary | ICD-10-CM | POA: Diagnosis not present

## 2017-05-06 DIAGNOSIS — S4991XA Unspecified injury of right shoulder and upper arm, initial encounter: Secondary | ICD-10-CM | POA: Diagnosis not present

## 2017-05-06 DIAGNOSIS — R402412 Glasgow coma scale score 13-15, at arrival to emergency department: Secondary | ICD-10-CM | POA: Diagnosis present

## 2017-05-06 DIAGNOSIS — S34139A Unspecified injury to sacral spinal cord, initial encounter: Secondary | ICD-10-CM | POA: Diagnosis not present

## 2017-05-06 DIAGNOSIS — S0990XA Unspecified injury of head, initial encounter: Secondary | ICD-10-CM | POA: Diagnosis not present

## 2017-05-06 DIAGNOSIS — K8020 Calculus of gallbladder without cholecystitis without obstruction: Secondary | ICD-10-CM | POA: Diagnosis present

## 2017-05-06 DIAGNOSIS — I129 Hypertensive chronic kidney disease with stage 1 through stage 4 chronic kidney disease, or unspecified chronic kidney disease: Secondary | ICD-10-CM | POA: Diagnosis present

## 2017-05-06 DIAGNOSIS — M810 Age-related osteoporosis without current pathological fracture: Secondary | ICD-10-CM | POA: Diagnosis present

## 2017-05-06 DIAGNOSIS — R7989 Other specified abnormal findings of blood chemistry: Secondary | ICD-10-CM | POA: Diagnosis present

## 2017-05-06 DIAGNOSIS — R109 Unspecified abdominal pain: Secondary | ICD-10-CM | POA: Diagnosis present

## 2017-05-06 DIAGNOSIS — R569 Unspecified convulsions: Principal | ICD-10-CM | POA: Diagnosis present

## 2017-05-06 DIAGNOSIS — Z7902 Long term (current) use of antithrombotics/antiplatelets: Secondary | ICD-10-CM

## 2017-05-06 DIAGNOSIS — G9340 Encephalopathy, unspecified: Secondary | ICD-10-CM | POA: Diagnosis not present

## 2017-05-06 DIAGNOSIS — R778 Other specified abnormalities of plasma proteins: Secondary | ICD-10-CM

## 2017-05-06 DIAGNOSIS — R748 Abnormal levels of other serum enzymes: Secondary | ICD-10-CM | POA: Diagnosis not present

## 2017-05-06 DIAGNOSIS — Z888 Allergy status to other drugs, medicaments and biological substances status: Secondary | ICD-10-CM

## 2017-05-06 DIAGNOSIS — S199XXA Unspecified injury of neck, initial encounter: Secondary | ICD-10-CM | POA: Diagnosis not present

## 2017-05-06 DIAGNOSIS — S20229A Contusion of unspecified back wall of thorax, initial encounter: Secondary | ICD-10-CM | POA: Diagnosis present

## 2017-05-06 DIAGNOSIS — R110 Nausea: Secondary | ICD-10-CM

## 2017-05-06 DIAGNOSIS — W19XXXA Unspecified fall, initial encounter: Secondary | ICD-10-CM | POA: Diagnosis present

## 2017-05-06 DIAGNOSIS — R630 Anorexia: Secondary | ICD-10-CM | POA: Diagnosis present

## 2017-05-06 DIAGNOSIS — S0003XA Contusion of scalp, initial encounter: Secondary | ICD-10-CM | POA: Diagnosis not present

## 2017-05-06 DIAGNOSIS — N182 Chronic kidney disease, stage 2 (mild): Secondary | ICD-10-CM | POA: Diagnosis present

## 2017-05-06 DIAGNOSIS — S300XXA Contusion of lower back and pelvis, initial encounter: Secondary | ICD-10-CM | POA: Diagnosis present

## 2017-05-06 DIAGNOSIS — M1990 Unspecified osteoarthritis, unspecified site: Secondary | ICD-10-CM | POA: Diagnosis present

## 2017-05-06 DIAGNOSIS — Z79899 Other long term (current) drug therapy: Secondary | ICD-10-CM

## 2017-05-06 DIAGNOSIS — R079 Chest pain, unspecified: Secondary | ICD-10-CM | POA: Diagnosis not present

## 2017-05-06 DIAGNOSIS — R296 Repeated falls: Secondary | ICD-10-CM | POA: Diagnosis present

## 2017-05-06 DIAGNOSIS — R55 Syncope and collapse: Secondary | ICD-10-CM | POA: Diagnosis not present

## 2017-05-06 DIAGNOSIS — F0390 Unspecified dementia without behavioral disturbance: Secondary | ICD-10-CM | POA: Diagnosis present

## 2017-05-06 DIAGNOSIS — Z6832 Body mass index (BMI) 32.0-32.9, adult: Secondary | ICD-10-CM

## 2017-05-06 DIAGNOSIS — E785 Hyperlipidemia, unspecified: Secondary | ICD-10-CM | POA: Diagnosis present

## 2017-05-06 DIAGNOSIS — I499 Cardiac arrhythmia, unspecified: Secondary | ICD-10-CM | POA: Diagnosis present

## 2017-05-06 DIAGNOSIS — K5900 Constipation, unspecified: Secondary | ICD-10-CM | POA: Diagnosis present

## 2017-05-06 DIAGNOSIS — S20221A Contusion of right back wall of thorax, initial encounter: Secondary | ICD-10-CM

## 2017-05-06 HISTORY — DX: Unspecified dementia, unspecified severity, without behavioral disturbance, psychotic disturbance, mood disturbance, and anxiety: F03.90

## 2017-05-06 LAB — URINALYSIS, ROUTINE W REFLEX MICROSCOPIC
Bilirubin Urine: NEGATIVE
Glucose, UA: NEGATIVE mg/dL
Hgb urine dipstick: NEGATIVE
Ketones, ur: NEGATIVE mg/dL
Nitrite: NEGATIVE
Protein, ur: 30 mg/dL — AB
Specific Gravity, Urine: 1.021 (ref 1.005–1.030)
pH: 5 (ref 5.0–8.0)

## 2017-05-06 LAB — CBC WITH DIFFERENTIAL/PLATELET
BASOS PCT: 0 %
Basophils Absolute: 0 10*3/uL (ref 0.0–0.1)
Eosinophils Absolute: 0 10*3/uL (ref 0.0–0.7)
Eosinophils Relative: 0 %
HEMATOCRIT: 40.9 % (ref 36.0–46.0)
HEMOGLOBIN: 13.6 g/dL (ref 12.0–15.0)
LYMPHS ABS: 1.2 10*3/uL (ref 0.7–4.0)
Lymphocytes Relative: 12 %
MCH: 31.1 pg (ref 26.0–34.0)
MCHC: 33.3 g/dL (ref 30.0–36.0)
MCV: 93.4 fL (ref 78.0–100.0)
MONOS PCT: 6 %
Monocytes Absolute: 0.6 10*3/uL (ref 0.1–1.0)
NEUTROS ABS: 8.3 10*3/uL — AB (ref 1.7–7.7)
NEUTROS PCT: 82 %
Platelets: 214 10*3/uL (ref 150–400)
RBC: 4.38 MIL/uL (ref 3.87–5.11)
RDW: 13.1 % (ref 11.5–15.5)
WBC: 10 10*3/uL (ref 4.0–10.5)

## 2017-05-06 LAB — I-STAT TROPONIN, ED: TROPONIN I, POC: 0.15 ng/mL — AB (ref 0.00–0.08)

## 2017-05-06 LAB — BASIC METABOLIC PANEL
Anion gap: 12 (ref 5–15)
BUN: 16 mg/dL (ref 6–20)
CHLORIDE: 107 mmol/L (ref 101–111)
CO2: 21 mmol/L — AB (ref 22–32)
CREATININE: 1.24 mg/dL — AB (ref 0.44–1.00)
Calcium: 10.2 mg/dL (ref 8.9–10.3)
GFR calc non Af Amer: 38 mL/min — ABNORMAL LOW (ref >60)
GFR, EST AFRICAN AMERICAN: 45 mL/min — AB (ref >60)
Glucose, Bld: 144 mg/dL — ABNORMAL HIGH (ref 65–99)
POTASSIUM: 3.6 mmol/L (ref 3.5–5.1)
Sodium: 140 mmol/L (ref 135–145)

## 2017-05-06 NOTE — ED Provider Notes (Deleted)
[facility] EMERGENCY DEPARTMENT Provider Note   CSN: [id number] Arrival date & time: 05/06/17  2056     History   Chief Complaint Chief Complaint  Patient presents with  . Fall    Head Injury/Plavix    HPI Wendy Krueger is a 83 y.o. female.  HPI Wendy Krueger is a 83 y.o. female with hx of dementia, htn, CVA, presents to ED with complaint of a fall.  Patient normally stays at home by herself during the afternoon.  Family came to her house and found her not ambulating well with new bruises.  Patient is a poor historian and unable to say what happened but does admit that she fell.  She is unable to say how she fell.  Patient is ambulatory however slower than usual and limping.  Patient is not complaining of any pain at this time.  Family found bruises to her head, back, bilateral arms and legs.  Patient normally does not use a walker inside the house but uses a walker if she goes outside.  She does have history of falls but family states that she does not fall frequently.  Past Medical History:  Diagnosis Date  . Asymptomatic gallstones   . Colitis - presumed infectious origin    one ER visit   . Dementia   . Hyperlipidemia   . Hypertension   . Osteoarthritis   . Osteoporosis     Patient Active Problem List   Diagnosis Date Noted  . Cerebellar stroke ([place]) 03/10/2016  . Carotid aneurysm, right ([place]) 02/21/2016  . Lipoma of shoulder 03/27/2015  . History of closed Colles' fracture 09/06/2014  . Allergic rhinitis 06/20/2014  . Diastolic dysfunction 32/95/1884  . History of GI bleed 03/21/2014  . Dementia 09/01/2013  . Memory loss 09/01/2013  . Anxiety 09/01/2013  . Encounter for Medicare annual wellness exam 01/08/2013  . Gallstones 11/11/2011  . 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 Risk for falls 06/04/2011  . Constipation 03/05/2011  . Vitamin D deficiency 03/28/2009  . HYPERCHOLESTEROLEMIA, PURE 03/17/2007  . Essential  hypertension 03/16/2007  . OSTEOARTHRITIS 03/16/2007  . Osteoporosis 03/16/2007    Past Surgical History:  Procedure Laterality Date  . ABDOMINAL HYSTERECTOMY     BSO- fibroids  . ABI's     normal  . dexa  1/05   osteoporosis  . left foot brace    . WRIST FRACTURE SURGERY  2/01   R arm    OB History    No data available       Home Medications    Prior to Admission medications   Medication Sig Start Date End Date Taking? Authorizing Provider  Cholecalciferol (VITAMIN D-3) 1000 units CAPS Take 1,000-2,000 capsules See admin instructions by mouth. Take 1000 units in the morning and 2000 in the evening   Yes [provider]  clopidogrel (PLAVIX) 75 MG tablet TAKE 1 TABLET BY MOUTH EVERY DAY Patient taking differently: TAKE 75 mg TABLET BY MOUTH EVERY DAY 02/27/17  Yes Millikan, Megan, NP  donepezil (ARICEPT) 10 MG tablet Take 1 tablet (10 mg total) by mouth at bedtime. 06/06/16  Yes Melvenia Beam, MD  Fexofenadine HCl (ALLERGY 24-HR PO) Take 1 capsule by mouth daily.   Yes [provider]  hydrochlorothiazide (HYDRODIURIL) 25 MG tablet Take 1 tablet (25 mg total) by mouth daily. 04/16/17  Yes Tower, Wynelle Fanny, MD  lisinopril (PRINIVIL,ZESTRIL) 20 MG tablet Take 1 tablet (20 mg total) by mouth daily.  04/16/17  Yes Tower, Wynelle Fanny, MD  memantine (NAMENDA) 10 MG tablet Take 1 tablet (10 mg total) by mouth 2 (two) times daily. 06/06/16  Yes Melvenia Beam, MD  metoprolol tartrate (LOPRESSOR) 50 MG tablet TAKE 1 TABLET (50 MG TOTAL) BY MOUTH 2 (TWO) TIMES DAILY. 04/16/17  Yes Tower, Wynelle Fanny, MD  Multiple Vitamins-Minerals ([employer] SPECTRAVITE PO) Take 1 capsule by mouth daily.   Yes [provider]  potassium chloride (KLOR-CON M10) 10 MEQ tablet Take 2 tablets (20 mEq total) by mouth daily. Patient taking differently: Take 20 mEq at bedtime by mouth.  04/16/17  Yes Tower, Wynelle Fanny, MD  vitamin B-12 (CYANOCOBALAMIN) 1000 MCG tablet Take 1,000 mcg by mouth daily.    Yes [provider]    Family History Family History  Problem Relation Age of Onset  . Heart attack Father   . Hypertension Father   . Heart attack Mother   . Throat cancer Brother     Social History Social History   Tobacco Use  . Smoking status: Never Smoker  . Smokeless tobacco: Never Used  Substance Use Topics  . Alcohol use: No    Alcohol/week: 0.0 oz  . Drug use: No     Allergies   Alendronate sodium; Amlodipine besylate; Calcitonin (salmon); and Simvastatin   Review of Systems Review of Systems  Unable to perform ROS: Dementia     Physical Exam Updated Vital Signs BP 130/71   Pulse 93   Temp 98.2 F (36.8 C) (Oral)   Resp (!) 21   Ht 5' (1.524 m)   Wt 74.4 kg (164 lb)   SpO2 99%   BMI 32.03 kg/m   Physical Exam  Constitutional: She appears well-developed and well-nourished. No distress.  HENT:  Head: Normocephalic.  Contusion to the back of the head  Eyes: Conjunctivae and EOM are normal. Pupils are equal, round, and reactive to light.  Neck: Neck supple.  Cardiovascular: Normal rate, regular rhythm and normal heart sounds.  Pulmonary/Chest: Effort normal and breath sounds normal. No respiratory distress. She has no wheezes. She has no rales.  Abdominal: Soft. Bowel sounds are normal. She exhibits no distension. There is no tenderness. There is no rebound.  Musculoskeletal: She exhibits no edema.  Large contusion to the right scapula and across the lower ribs over the back.  Large contusion over her sacrum.  Diffuse tenderness to palpation.  Full range of motion of bilateral shoulders with no pain.  Normal range of motion bilateral elbows, wrists, hands.  Pelvis is stable.  Full range of motion of bilateral hips.  Contusions to the anterior knees bilaterally, full range of motion of the knees.  Joints are stable with negative anterior posterior drawer signs.  Normal ankles and feet.  Neurological: She is alert.  Disoriented, however answers  all the questions at times appropriately at times not.  Patient is confused.  Moving all extremities.  Normal cranial nerves.  Normal neurological exam including 5 out of 5 and equal strength of upper and lower extremities bilaterally.  No pronator drift.  Skin: Skin is warm and dry. There is pallor.  Psychiatric: She has a normal mood and affect. Her behavior is normal.  Nursing note and vitals reviewed.    ED Treatments / Results  Labs (all labs ordered are listed, but only abnormal results are displayed) Labs Reviewed  URINALYSIS, ROUTINE W REFLEX MICROSCOPIC - Abnormal; Notable for the following components:      Result Value  Protein, ur 30 (*)    Leukocytes, UA SMALL (*)    Bacteria, UA RARE (*)    Squamous Epithelial / LPF 0-5 (*)    Non Squamous Epithelial 0-5 (*)    All other components within normal limits  CBC WITH DIFFERENTIAL/PLATELET - Abnormal; Notable for the following components:   Neutro Abs 8.3 (*)    All other components within normal limits  BASIC METABOLIC PANEL - Abnormal; Notable for the following components:   CO2 21 (*)    Glucose, Bld 144 (*)    Creatinine, Ser 1.24 (*)    GFR calc non Af Amer 38 (*)    GFR calc Af Amer 45 (*)    All other components within normal limits  TROPONIN I - Abnormal; Notable for the following components:   Troponin I 0.10 (*)    All other components within normal limits  I-STAT TROPONIN, ED - Abnormal; Notable for the following components:   Troponin i, poc 0.15 (*)    All other components within normal limits    EKG  EKG Interpretation  Date/Time:  Tuesday May 06 2017 21:04:28 EST Ventricular Rate:  97 PR Interval:  162 QRS Duration: 126 QT Interval:  390 QTC Calculation: 495 R Axis:     Text Interpretation:  Normal sinus rhythm Left bundle branch block Abnormal ECG Confirmed by Virgel Manifold [phone]) on 05/07/2017 12:18:36 AM       Radiology Dg Chest 2 View  Result Date: 05/06/2017 CLINICAL DATA:  Fall  with back bruising EXAM: CHEST  2 VIEW COMPARISON:  01/05/2011 FINDINGS: Minimal scarring at the left base. No acute consolidation or effusion. Cardiomegaly. Aortic atherosclerosis. No pneumothorax. Chronic thoracic compression deformities. IMPRESSION: 1. Cardiomegaly without edema or infiltrate 2. Chronic compression deformities of the thoracic spine Electronically Signed   By: Donavan Foil M.D.   On: 05/06/2017 23:53   Dg Thoracic Spine 2 View  Result Date: 05/06/2017 CLINICAL DATA:  Fall with back bruising EXAM: THORACIC SPINE 2 VIEWS COMPARISON:  01/05/2011 FINDINGS: Eleven rib pairs. Chronic moderate severe compression fracture at T8 with mild to moderate compression fracture at T7 also chronic. Chronic mild compression of T11. Mild degenerative changes. IMPRESSION: 1. No definite acute osseous abnormality 2. Multiple chronic compression fractures at T7, T8 and T11 Electronically Signed   By: Donavan Foil M.D.   On: 05/06/2017 23:49   Dg Lumbar Spine Complete  Result Date: 05/06/2017 CLINICAL DATA:  Fall with bruising EXAM: LUMBAR SPINE - COMPLETE 4+ VIEW COMPARISON:  CT 11/07/2011 FINDINGS: Trace anterolisthesis of L4 on L5. Moderate degenerative changes at L4-L5 and L5-S1. Mild chronic compression at T11. Aortic atherosclerosis. IMPRESSION: No definite acute osseous abnormality Electronically Signed   By: Donavan Foil M.D.   On: 05/06/2017 23:51   Dg Sacrum/coccyx  Result Date: 05/06/2017 CLINICAL DATA:  Fall with bruising EXAM: SACRUM AND COCCYX - 2+ VIEW COMPARISON:  11/07/2011 FINDINGS: SI joint arthritis. No widening. Pubic symphysis and rami appear intact. No definite acute displaced fracture. IMPRESSION: No definite acute osseous abnormality Electronically Signed   By: Donavan Foil M.D.   On: 05/06/2017 23:52   Dg Scapula Right  Result Date: 05/06/2017 CLINICAL DATA:  Bruising after fall. EXAM: RIGHT SCAPULA - 2+ VIEWS COMPARISON:  Chest CT 01/08/2011 FINDINGS: There is no evidence  of fracture or other focal bone lesions. Osteoarthritis of the adjacent acromioclavicular joint. The adjacent ribs are intact. No pneumothorax or pulmonary consolidation. There is thoracic spondylosis with chronic T8 compression  based on remote CT from 2012. IMPRESSION: No acute scapular fracture. 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 Electronically Signed   By: Ashley Royalty M.D.   On: 05/06/2017 23:45   Ct Head Wo Contrast  Result Date: 05/06/2017 CLINICAL DATA:  [place] at home scalp hematoma EXAM: CT HEAD WITHOUT CONTRAST CT CERVICAL SPINE WITHOUT CONTRAST TECHNIQUE: Multidetector CT imaging of the head and cervical spine was performed following the standard protocol without intravenous contrast. Multiplanar CT image reconstructions of the cervical spine were also generated. COMPARISON:  MRI 03/01/2016, CT brain 01/05/2011 FINDINGS: CT HEAD FINDINGS Brain: No acute territorial infarction, hemorrhage, or intracranial mass is visualized. Moderate atrophy. Mild small vessel ischemic changes of the white matter. Stable ventricle size. Vascular: No hyperdense vessels. Vertebral artery and carotid artery calcifications. 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 Skull: No skull fracture. Sinuses/Orbits: No acute finding. Other: Large posterior scalp swelling. CT CERVICAL SPINE FINDINGS Alignment: Trace anterolisthesis of C7 on T1. Mild straightening. Facet alignment is within normal limits. Skull base and vertebrae: Craniovertebral junction is intact. There is no displaced fracture. Soft tissues and spinal canal: No prevertebral fluid or swelling. No visible canal hematoma. Disc levels: Moderate severe multilevel degenerative disc changes from C4 through C7. Multilevel bilateral facet arthropathy with right greater than left foraminal stenosis. Upper chest: Lung apices are clear. Calcified nodule left lobe of thyroid. 2.3  cm hypodense nodule in the region of right lobe of thyroid. Other: None IMPRESSION: 1. No CT evidence for acute intracranial abnormality. Large posterior scalp soft tissue swelling 2. Trace anterolisthesis of C7 on T1.  No fracture. 3. 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 4. Bilateral thyroid nodules. Nonemergent thyroid ultrasound correlation as indicated. Electronically Signed   By: Donavan Foil M.D.   On: 05/06/2017 23:10   Ct Cervical Spine Wo Contrast  Result Date: 05/06/2017 CLINICAL DATA:  [place] at home scalp hematoma EXAM: CT HEAD WITHOUT CONTRAST CT CERVICAL SPINE WITHOUT CONTRAST TECHNIQUE: Multidetector CT imaging of the head and cervical spine was performed following the standard protocol without intravenous contrast. Multiplanar CT image reconstructions of the cervical spine were also generated. COMPARISON:  MRI 03/01/2016, CT brain 01/05/2011 FINDINGS: CT HEAD FINDINGS Brain: No acute territorial infarction, hemorrhage, or intracranial mass is visualized. Moderate atrophy. Mild small vessel ischemic changes of the white matter. Stable ventricle size. Vascular: No hyperdense vessels. Vertebral artery and carotid artery calcifications. 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 Skull: No skull fracture. Sinuses/Orbits: No acute finding. Other: Large posterior scalp swelling. CT CERVICAL SPINE FINDINGS Alignment: Trace anterolisthesis of C7 on T1. Mild straightening. Facet alignment is within normal limits. Skull base and vertebrae: Craniovertebral junction is intact. There is no displaced fracture. Soft tissues and spinal canal: No prevertebral fluid or swelling. No visible canal hematoma. Disc levels: Moderate severe multilevel degenerative disc changes from C4 through C7. Multilevel bilateral facet arthropathy with right greater than left foraminal stenosis. Upper chest:  Lung apices are clear. Calcified nodule left lobe of thyroid. 2.3 cm hypodense nodule in the region of right lobe of thyroid. Other: None IMPRESSION: 1. No CT evidence for acute intracranial abnormality. Large posterior scalp soft tissue swelling 2. Trace anterolisthesis of C7 on T1.  No fracture. 3. Interval enlargement of a right carotid terminus aneurysm, now measuring 9 mm, nonemergent follow-up MRA  or CTA as indicated. 4. Bilateral thyroid nodules. Nonemergent thyroid ultrasound correlation as indicated. Electronically Signed   By: Donavan Foil M.D.   On: 05/06/2017 23:10    Procedures Procedures (including critical care time)  Medications Ordered in ED Medications - No data to display   Initial Impression / Assessment and Plan / ED Course  I have reviewed the triage vital signs and the nursing notes.  Pertinent labs & imaging results that were available during my care of the patient were reviewed by me and considered in my medical decision making (see chart for details).     Patient after a fall at home, unwitnessed.  Patient appears to be pale.  She has no complaints.  She has large bruises to the back, back of the head, bilateral arms and anterior knees.  Will get basic labs EKG and troponin.  Will get urinalysis.  CT head and cervical spine ordered in triage.  Will image chest, back, sacrum.   12:11 AM UA negative. Xray with no acute findings. Trop elevated. LLB on ECG, hx of the same. Question cardiac reason for syncope? Pt apparently also had an episode in the waiting room where her eyes rolled back for few sec. Arrhythmia? Pt is full code at this time. Discussed with family. Will admit for observation and further work up.   Vitals:   05/06/17 2100 05/06/17 2215 05/06/17 2215 05/06/17 2230  BP: (!) 152/99 130/71  118/64  Pulse: 99 93  85  Resp: 16 (!) 21  18  Temp: 98.2 F (36.8 C)     TempSrc: Oral     SpO2: 100% 95% 99% 97%  Weight: 74.4 kg (164 lb)     Height: 5'  (1.524 m)       Final Clinical Impressions(s) / ED Diagnoses   Final diagnoses:  Fall, initial encounter  Contusion of scalp, initial encounter  Contusion of right side of back, initial encounter  Contusion of sacrum, initial encounter  Elevated troponin    ED Discharge Orders    None       Jeannett Senior, PA-C 05/07/17 0019    Jeannett Senior, PA-C 05/07/17 [phone]

## 2017-05-06 NOTE — ED Notes (Signed)
Pt taken to CT then going to Xray from there.

## 2017-05-06 NOTE — ED Triage Notes (Addendum)
Patient lost her balance and fell at home today , presents with scalp hematoma/bruise at occiput , abrasion and bruises at upper back , pt. unable to recount incident due to dementia , consulted Dr. Wilson Singer ( [place] ) ordered CT scans /UA. Pt. takes Plavix qd.

## 2017-05-07 ENCOUNTER — Observation Stay (HOSPITAL_COMMUNITY): Payer: Medicare Other

## 2017-05-07 ENCOUNTER — Encounter (HOSPITAL_COMMUNITY): Payer: Self-pay | Admitting: General Practice

## 2017-05-07 ENCOUNTER — Other Ambulatory Visit: Payer: Self-pay

## 2017-05-07 ENCOUNTER — Observation Stay (HOSPITAL_BASED_OUTPATIENT_CLINIC_OR_DEPARTMENT_OTHER): Payer: Medicare Other

## 2017-05-07 ENCOUNTER — Other Ambulatory Visit: Payer: Self-pay | Admitting: Cardiology

## 2017-05-07 DIAGNOSIS — S300XXA Contusion of lower back and pelvis, initial encounter: Secondary | ICD-10-CM

## 2017-05-07 DIAGNOSIS — K8020 Calculus of gallbladder without cholecystitis without obstruction: Secondary | ICD-10-CM | POA: Diagnosis not present

## 2017-05-07 DIAGNOSIS — R55 Syncope and collapse: Secondary | ICD-10-CM | POA: Diagnosis not present

## 2017-05-07 DIAGNOSIS — S20221A Contusion of right back wall of thorax, initial encounter: Secondary | ICD-10-CM

## 2017-05-07 DIAGNOSIS — I361 Nonrheumatic tricuspid (valve) insufficiency: Secondary | ICD-10-CM

## 2017-05-07 DIAGNOSIS — I371 Nonrheumatic pulmonary valve insufficiency: Secondary | ICD-10-CM | POA: Diagnosis not present

## 2017-05-07 DIAGNOSIS — S1093XA Contusion of unspecified part of neck, initial encounter: Secondary | ICD-10-CM | POA: Diagnosis not present

## 2017-05-07 DIAGNOSIS — W19XXXA Unspecified fall, initial encounter: Secondary | ICD-10-CM | POA: Diagnosis present

## 2017-05-07 DIAGNOSIS — S0990XA Unspecified injury of head, initial encounter: Secondary | ICD-10-CM | POA: Diagnosis not present

## 2017-05-07 DIAGNOSIS — F0150 Vascular dementia without behavioral disturbance: Secondary | ICD-10-CM

## 2017-05-07 DIAGNOSIS — S0003XA Contusion of scalp, initial encounter: Secondary | ICD-10-CM

## 2017-05-07 DIAGNOSIS — R102 Pelvic and perineal pain: Secondary | ICD-10-CM | POA: Diagnosis not present

## 2017-05-07 DIAGNOSIS — S9032XA Contusion of left foot, initial encounter: Secondary | ICD-10-CM | POA: Diagnosis not present

## 2017-05-07 LAB — BASIC METABOLIC PANEL
Anion gap: 11 (ref 5–15)
BUN: 18 mg/dL (ref 6–20)
CALCIUM: 9.3 mg/dL (ref 8.9–10.3)
CO2: 22 mmol/L (ref 22–32)
CREATININE: 1.1 mg/dL — AB (ref 0.44–1.00)
Chloride: 108 mmol/L (ref 101–111)
GFR calc non Af Amer: 44 mL/min — ABNORMAL LOW (ref >60)
GFR, EST AFRICAN AMERICAN: 52 mL/min — AB (ref >60)
Glucose, Bld: 111 mg/dL — ABNORMAL HIGH (ref 65–99)
Potassium: 3.3 mmol/L — ABNORMAL LOW (ref 3.5–5.1)
SODIUM: 141 mmol/L (ref 135–145)

## 2017-05-07 LAB — TROPONIN I
TROPONIN I: 0.1 ng/mL — AB (ref <0.03)
TROPONIN I: 0.06 ng/mL — AB (ref <0.03)

## 2017-05-07 LAB — CBC
HCT: 35.6 % — ABNORMAL LOW (ref 36.0–46.0)
Hemoglobin: 11.4 g/dL — ABNORMAL LOW (ref 12.0–15.0)
MCH: 30.2 pg (ref 26.0–34.0)
MCHC: 32 g/dL (ref 30.0–36.0)
MCV: 94.2 fL (ref 78.0–100.0)
PLATELETS: 151 10*3/uL (ref 150–400)
RBC: 3.78 MIL/uL — AB (ref 3.87–5.11)
RDW: 13.2 % (ref 11.5–15.5)
WBC: 6.2 10*3/uL (ref 4.0–10.5)

## 2017-05-07 LAB — CK: Total CK: 77 U/L (ref 38–234)

## 2017-05-07 LAB — ECHOCARDIOGRAM COMPLETE
Height: 60 in
WEIGHTICAEL: 2624 [oz_av]

## 2017-05-07 MED ORDER — VITAMIN B-12 1000 MCG PO TABS
1000.0000 ug | ORAL_TABLET | Freq: Every day | ORAL | Status: DC
  Administered 2017-05-07 – 2017-05-09 (×3): 1000 ug via ORAL
  Filled 2017-05-07 (×3): qty 1

## 2017-05-07 MED ORDER — DONEPEZIL HCL 10 MG PO TABS
10.0000 mg | ORAL_TABLET | Freq: Every day | ORAL | Status: DC
  Administered 2017-05-07 – 2017-05-08 (×2): 10 mg via ORAL
  Filled 2017-05-07 (×2): qty 1

## 2017-05-07 MED ORDER — ONDANSETRON HCL 4 MG/2ML IJ SOLN: 4.0000 mg | Freq: Four times a day (QID) | INTRAMUSCULAR | Status: DC | PRN

## 2017-05-07 MED ORDER — SODIUM CHLORIDE 0.9 % IV SOLN
Freq: Once | INTRAVENOUS | Status: AC
  Administered 2017-05-07: via INTRAVENOUS

## 2017-05-07 MED ORDER — METOPROLOL TARTRATE 50 MG PO TABS
50.0000 mg | ORAL_TABLET | Freq: Two times a day (BID) | ORAL | Status: DC
  Administered 2017-05-07 – 2017-05-08 (×3): 50 mg via ORAL
  Filled 2017-05-07 (×3): qty 1

## 2017-05-07 MED ORDER — CLOPIDOGREL BISULFATE 75 MG PO TABS
75.0000 mg | ORAL_TABLET | Freq: Every day | ORAL | Status: DC
  Administered 2017-05-07 – 2017-05-09 (×3): 75 mg via ORAL
  Filled 2017-05-07 (×3): qty 1

## 2017-05-07 MED ORDER — POTASSIUM CHLORIDE CRYS ER 20 MEQ PO TBCR
40.0000 meq | EXTENDED_RELEASE_TABLET | Freq: Once | ORAL | Status: AC
  Administered 2017-05-07: 40 meq via ORAL
  Filled 2017-05-07: qty 2

## 2017-05-07 MED ORDER — ONDANSETRON HCL 4 MG PO TABS: 4.0000 mg | ORAL_TABLET | Freq: Four times a day (QID) | ORAL | Status: DC | PRN

## 2017-05-07 MED ORDER — MEMANTINE HCL 10 MG PO TABS
10.0000 mg | ORAL_TABLET | Freq: Two times a day (BID) | ORAL | Status: DC
  Administered 2017-05-07 – 2017-05-09 (×5): 10 mg via ORAL
  Filled 2017-05-07 (×7): qty 1

## 2017-05-07 MED ORDER — ACETAMINOPHEN 325 MG PO TABS
650.0000 mg | ORAL_TABLET | Freq: Four times a day (QID) | ORAL | Status: DC | PRN
  Administered 2017-05-08: 650 mg via ORAL
  Filled 2017-05-07: qty 2

## 2017-05-07 MED ORDER — LEVETIRACETAM 500 MG PO TABS
500.0000 mg | ORAL_TABLET | Freq: Two times a day (BID) | ORAL | Status: DC
  Administered 2017-05-07: 500 mg via ORAL
  Filled 2017-05-07: qty 1

## 2017-05-07 MED ORDER — ACETAMINOPHEN 650 MG RE SUPP: 650.0000 mg | Freq: Four times a day (QID) | RECTAL | Status: DC | PRN

## 2017-05-07 MED ORDER — LISINOPRIL 20 MG PO TABS
20.0000 mg | ORAL_TABLET | Freq: Every day | ORAL | Status: DC
  Administered 2017-05-07 – 2017-05-09 (×3): 20 mg via ORAL
  Filled 2017-05-07 (×3): qty 1

## 2017-05-07 NOTE — Progress Notes (Signed)
EEG complete - results pending 

## 2017-05-07 NOTE — Progress Notes (Signed)
[facility] PLAN OF CARE NOTE Patient: Wendy Krueger LID:[id number]   PCP: Abner Greenspan, MD DOB: 1929-09-18   DOA: 05/06/2017   DOS: 05/07/2017    Patient was admitted by my colleague Dr. Hal Hope  earlier on 05/07/2017. I have reviewed the H&P as well as assessment and plan and agree with the same. Important changes in the plan are listed below.  Plan of care: Principal Problem:   Near syncope Active Problems:   Dementia   Fall   Contusion of scalp   Contusion of sacrum   Contusion of right side of back Elevated troponin. Possibility of cardiac event more likely causing patient's near syncope. Given the history seizure also cannot be ruled out. Neurology consulted, starting the patient on [place]. Following up on echocardiogram may require further workup.  Author: Berle Mull, MD [facility] Pager: [phone] 05/07/2017 5:19 PM   If 7PM-7AM, please contact night-coverage at [email or web address], password [facility]

## 2017-05-07 NOTE — Procedures (Signed)
ELECTROENCEPHALOGRAM REPORT  Date of Study: 05/07/2017  Patient's Name: Wendy Krueger MRN: [id number] Date of Birth: 1929-09-21  Referring Provider: Gean Birchwood, MD  Clinical History: 83 year old woman with dementia presents following presumed unwitnessed fall in which she was found with bruises on her body.  Medications: donepezil (ARICEPT) tablet 10 mg  lisinopril (PRINIVIL,ZESTRIL) tablet 20 mg  memantine (NAMENDA) tablet 10 mg  metoprolol tartrate (LOPRESSOR) tablet 50 mg  ondansetron (ZOFRAN) tablet 4 mg  vitamin B-12   Technical Summary: A multichannel digital EEG recording measured by the international 10-20 system with electrodes applied with paste and impedances below 5000 ohms performed as portable with EKG monitoring in an awake and asleep patient.  Hyperventilation and photic stimulation were not performed.  The digital EEG was referentially recorded, reformatted, and digitally filtered in a variety of bipolar and referential montages for optimal display.   Description: The patient is awake and asleep during the recording.  During maximal wakefulness, there is a symmetric, low voltage 5-6 Hz posterior dominant rhythm that attenuates with eye opening. This is admixed with diffuse low-voltage 4-5 Hz theta and 2-3 Hz delta slowing of the waking background.  During drowsiness and sleep, there is an increase in theta slowing of the background.  Vertex waves and symmetric sleep spindles were seen.  There were no epileptiform discharges or electrographic seizures seen.    EKG lead was unremarkable.  Impression: This awake and asleep EEG is abnormal due to mild diffuse slowing of the waking background.  Clinical Correlation of the above findings indicates diffuse cerebral dysfunction that is non-specific in etiology and can be seen with hypoxic/ischemic injury, toxic/metabolic encephalopathies, neurodegenerative disorders, or medication effect.  The absence of epileptiform  discharges does not rule out a clinical diagnosis of epilepsy.  Clinical correlation is advised.  Metta Clines, DO

## 2017-05-07 NOTE — Progress Notes (Signed)
  Echocardiogram 2D Echocardiogram has been performed.  Wendy Krueger Wendy Krueger 05/07/2017, 2:38 PM

## 2017-05-07 NOTE — Progress Notes (Signed)
PT Cancellation Note  Patient Details Name: Wendy Krueger MRN: [id number] DOB: [birth year]   Cancelled Treatment:    Reason Eval/Treat Not Completed: Patient at procedure or test/unavailable Pt is currently off the floor. PT will follow back later today for evaluation. Thanks,  Dani Gobble. Migdalia Dk PT, DPT Acute Rehabilitation  [phone] Pager [phone]     [place] 05/07/2017, 8:54 AM

## 2017-05-07 NOTE — Progress Notes (Signed)
Pt returned from tests at this time.  Off of floor since 7:25 a.m.  Dr. Posey Pronto notified of patient's return.

## 2017-05-07 NOTE — Evaluation (Signed)
Physical Therapy Evaluation Patient Details Name: Wendy Krueger MRN: [id number] DOB: [birth year] Today's Date: 05/07/2017   History of Present Illness  Wendy Krueger is a 83 y.o. female with hx of dementia, htn, CVA, presents to ED with complaint of a unwitnessed fall and near sycopal episode.  Patient normally stays at home by herself during the day. At around 7 PM patient's daughter who lives with the patient returned back home and found the patient had multiple bruises on her body including the scalp and extremities and upper back and not ambulating well.  Pt is a poor historian and unable to say what happened. Pt also experienced episode of diaphoresis, pallor, eyes rolling back and incoherence for approximately 30 seconds to a minute and then a short period of confusion and then return to baseline dementia. MRI revealed no acute intracranial abnormality,  Xrays clear of acute fx. Events thought to be of a cardiogenic nature.   Clinical Impression  Pt admitted with above diagnosis. Pt currently with functional limitations due to the deficits listed below (see PT Problem List). PTA modified independent with ambulation with RW, and fixing simple meals while at home during the day while her daughter was at work. Pt safe mobility is limited by poor safety awareness and poor use of RW for stability. Pt is currently supervision for bed mobility, min guard for transfers and minA for management of RW with ambulation of 40 feet. Family is agreeable to work on 24 hour care for safety in which case d/c home is appropriate. Pt will benefit from skilled PT in the acute setting to increase their independence and safety with mobility to allow discharge to the venue listed below.       Follow Up Recommendations No PT follow up;Supervision/Assistance - 24 hour    Equipment Recommendations  Rolling walker with 5" wheels(youth walker)    Recommendations for Other Services       Precautions / Restrictions  Precautions Precautions: Fall Restrictions Weight Bearing Restrictions: No      Mobility  Bed Mobility Overal bed mobility: Needs Assistance Bed Mobility: Supine to Sit     Supine to sit: Supervision;HOB elevated     General bed mobility comments: supervision for safety, use of bed rails to pull to EoB  Transfers Overall transfer level: Needs assistance Equipment used: Rolling walker (2 wheeled) Transfers: Sit to/from Stand Sit to Stand: Min guard         General transfer comment: hands on min guard for safety, good power up and steadying with RW  Ambulation/Gait Ambulation/Gait assistance: Min assist Ambulation Distance (Feet): 40 Feet Assistive device: Rolling walker (2 wheeled) Gait Pattern/deviations: Step-through pattern;Decreased stride length;Shuffle Gait velocity: slowed Gait velocity interpretation: Below normal speed for age/gender General Gait Details: minA for steadying and RW management, maximal verbal cuing for sequencing, not picking the walker up and staying up in walker        Balance Overall balance assessment: Needs assistance Sitting-balance support: Feet supported;No upper extremity supported Sitting balance-Leahy Scale: Fair     Standing balance support: Bilateral upper extremity supported Standing balance-Leahy Scale: Fair Standing balance comment: requires RW support                             Pertinent Vitals/Pain Pain Assessment: No/denies pain    Home Living Family/patient expects to be discharged to:: Private residence Living Arrangements: Children   Type of Home: House Home Access: Stairs to  enter Entrance Stairs-Rails: Right Entrance Stairs-Number of Steps: 3 Home Layout: One level Home Equipment: Walker - 2 wheels;Cane - single point;Bedside commode;Shower seat;Grab bars - tub/shower;Hand held shower head      Prior Function Level of Independence: Needs assistance   Gait / Transfers Assistance Needed:  able to walk short community distances with RW, prefers can although safe  ADL's / Homemaking Assistance Needed: requires help with bathing, able to dress herself, can only make toast, doesn't remember how to use other appliances   Comments: stays alone for 10 hrs a day,      Hand Dominance        Extremity/Trunk Assessment   Upper Extremity Assessment Upper Extremity Assessment: Overall WFL for tasks assessed    Lower Extremity Assessment Lower Extremity Assessment: Generalized weakness    Cervical / Trunk Assessment Cervical / Trunk Assessment: Kyphotic  Communication   Communication: No difficulties  Cognition Arousal/Alertness: Awake/alert Behavior During Therapy: Restless Overall Cognitive Status: History of cognitive impairments - at baseline                                 General Comments: Pt oriented to self, poor safety awareness, follows one step commands inconsistently.       General Comments General comments (skin integrity, edema, etc.): Pt daughters present and provided home living and prior function history. Daughters report pt is at baseline level of dementia     Exercises     Assessment/Plan    PT Assessment Patient needs continued PT services  PT Problem List Decreased strength;Decreased balance;Decreased mobility;Decreased cognition;Decreased knowledge of use of DME;Decreased safety awareness       PT Treatment Interventions DME instruction;Gait training;Functional mobility training;Stair training;Therapeutic activities;Therapeutic exercise;Balance training;Cognitive remediation;Patient/family education    PT Goals (Current goals can be found in the Care Plan section)  Acute Rehab PT Goals Patient Stated Goal: did not state  PT Goal Formulation: With patient/family Time For Goal Achievement: 05/21/17 Potential to Achieve Goals: Fair    Frequency Min 3X/week   Barriers to discharge        Co-evaluation                AM-PAC PT "6 Clicks" Daily Activity  Outcome Measure Difficulty turning over in bed (including adjusting bedclothes, sheets and blankets)?: A Little Difficulty moving from lying on back to sitting on the side of the bed? : A Little Difficulty sitting down on and standing up from a chair with arms (e.g., wheelchair, bedside commode, etc,.)?: A Little Help needed moving to and from a bed to chair (including a wheelchair)?: A Little Help needed walking in hospital room?: A Little Help needed climbing 3-5 steps with a railing? : A Lot 6 Click Score: 17    End of Session Equipment Utilized During Treatment: Gait belt Activity Tolerance: Patient tolerated treatment well Patient left: in chair;with call bell/phone within reach;with chair alarm set;with family/visitor present Nurse Communication: Mobility status PT Visit Diagnosis: Unsteadiness on feet (R26.81);Other abnormalities of gait and mobility (R26.89);Repeated falls (R29.6);Muscle weakness (generalized) (M62.81);History of falling (Z91.81);Difficulty in walking, not elsewhere classified (R26.2);Other symptoms and signs involving the nervous system (R29.898)    Time: 0109-3235 PT Time Calculation (min) (ACUTE ONLY): 43 min   Charges:   PT Evaluation $PT Eval Moderate Complexity: 1 Mod PT Treatments $Gait Training: 8-22 mins $Therapeutic Activity: 8-22 mins   PT G Codes:   PT G-Codes **NOT FOR INPATIENT  CLASS** Functional Assessment Tool Used: AM-PAC 6 Clicks Basic Mobility Functional Limitation: Mobility: Walking and moving around Mobility: Walking and Moving Around Current Status [phone]): At least 40 percent but less than 60 percent impaired, limited or restricted Mobility: Walking and Moving Around Goal Status [phone]): At least 1 percent but less than 20 percent impaired, limited or restricted    Benjamine Mola B. Migdalia Dk PT, DPT Acute Rehabilitation  [phone] Pager [phone]    [place] 05/07/2017,  4:32 PM

## 2017-05-07 NOTE — Consult Note (Signed)
NEURO HOSPITALIST CONSULT NOTE   Requestig physician: Dr. Posey Pronto    Reason for Consult: Syncope versus seizure   History obtained from:  Patient family  HPI:                                                                                                                                          Wendy Krueger is an 83 y.o. female with known dementia that is pretty severe.  She has family around the clock that takes care of her.  Patient does not know the date, year, month, states that she lives in.  She basically can only laugh at me and follow some commands.  All the information was gathered from her family.  Apparently patient has had episodes that are as follows and stereotypical every time:  Patient will suddenly become pale, diaphoretic, limp, eyes rolled back and unresponsive for approximately 30 seconds to  may be a minute and then will come back to her normal self within 1-2 minutes able to talk and follow commands.  Unfortunately the patient has no recollection of this has such a neurocognitive decline she cannot give me any recollection of the past episodes.  Patient has had multiple EEGs including a 72-hour LTM in which she did not have any episodes.  Unfortunately patient only has episodes possibly once a year or once every 2 years.  The history of the event this time is as follows: 1 of the sisters visited her at her house and found a pair of wet soiled pants next to her and she was on the floor.  Initially they thought she had fallen however when they took her clothes off they saw multiple bruises and a scalp lesion.  It is unclear how these occurred.  But for that reason she was brought to the hospital.  It is unclear if she has had other episodes when not with somebody.  Prior to recently she lived alone however at this point time she has [facility] caring care of her.  She is followed as an outpatient by Dr. Lavell [facility].  As stated due  to the multiple EEGs it was recommended that she start a antiepileptic medication to see if this would help however the family has been reluctant to start one.  Today I talked to Wendy Krueger the power of Krueger and had a long discussion about placing patient on a Holter monitor and it would be beneficial to start her on Keppra.  She is in agreement of doing both.  Past Medical History:  Diagnosis Date  . Asymptomatic gallstones   . Colitis - presumed infectious origin    one ER visit   . Dementia   . Hyperlipidemia   . Hypertension   . Osteoarthritis   .  Osteoporosis     Past Surgical History:  Procedure Laterality Date  . ABDOMINAL HYSTERECTOMY     BSO- fibroids  . ABI's     normal  . dexa  1/05   osteoporosis  . left foot brace    . WRIST FRACTURE SURGERY  2/01   R arm    Family History  Problem Relation Age of Onset  . Heart attack Father   . Hypertension Father   . Heart attack Mother   . Throat cancer Brother      Social History:  reports that  has never smoked. she has never used smokeless tobacco. She reports that she does not drink alcohol or use drugs.  Allergies  Allergen Reactions  . Alendronate Sodium     Leg pain   . Amlodipine Besylate Hives  . Calcitonin ([place]) Other (See Comments)     headache/ head pressure  . Simvastatin Other (See Comments)    Leg pain     MEDICATIONS:                                                                                                                     Prior to Admission:  Medications Prior to Admission  Medication Sig Dispense Refill Last Dose  . Cholecalciferol (VITAMIN D-3) 1000 units CAPS Take 1,000-2,000 capsules See admin instructions by mouth. Take 1000 units in the morning and 2000 in the evening   05/06/2017 at Unknown time  . clopidogrel (PLAVIX) 75 MG tablet TAKE 1 TABLET BY MOUTH EVERY DAY (Patient taking differently: TAKE 75 mg TABLET BY MOUTH EVERY DAY) 30 tablet 11 05/06/2017 at Unknown time  .  donepezil (ARICEPT) 10 MG tablet Take 1 tablet (10 mg total) by mouth at bedtime. 30 tablet 12 05/05/2017 at Unknown time  . Fexofenadine HCl (ALLERGY 24-HR PO) Take 1 capsule by mouth daily.   05/06/2017 at Unknown time  . hydrochlorothiazide (HYDRODIURIL) 25 MG tablet Take 1 tablet (25 mg total) by mouth daily. 90 tablet 3 05/06/2017 at Unknown time  . lisinopril (PRINIVIL,ZESTRIL) 20 MG tablet Take 1 tablet (20 mg total) by mouth daily. 90 tablet 3 05/06/2017 at Unknown time  . memantine (NAMENDA) 10 MG tablet Take 1 tablet (10 mg total) by mouth 2 (two) times daily. 60 tablet 12 05/06/2017 at Unknown time  . metoprolol tartrate (LOPRESSOR) 50 MG tablet TAKE 1 TABLET (50 MG TOTAL) BY MOUTH 2 (TWO) TIMES DAILY. 180 tablet 3 05/06/2017 at 0800  . Multiple Vitamins-Minerals ([employer] SPECTRAVITE PO) Take 1 capsule by mouth daily.   05/06/2017 at Unknown time  . potassium chloride (KLOR-CON M10) 10 MEQ tablet Take 2 tablets (20 mEq total) by mouth daily. (Patient taking differently: Take 20 mEq at bedtime by mouth. ) 180 tablet 3 05/05/2017 at Unknown time  . vitamin B-12 (CYANOCOBALAMIN) 1000 MCG tablet Take 1,000 mcg by mouth daily.   05/06/2017 at Unknown time   Scheduled: . clopidogrel  75 mg Oral Daily  . donepezil  10 mg  Oral QHS  . lisinopril  20 mg Oral Daily  . memantine  10 mg Oral BID  . metoprolol tartrate  50 mg Oral BID  . potassium chloride  40 mEq Oral Once  . vitamin B-12  1,000 mcg Oral Daily     ROS:                                                                                                                                       History obtained from unobtainable from patient due to Significant neurocognitive decline     Blood pressure 136/60, pulse 60, temperature 98.9 F (37.2 C), temperature source Oral, resp. rate 18, height 5' (1.524 m), weight 74.4 kg (164 lb), SpO2 100 %.   Neurologic Examination:                                                                                                       HEENT-  Normocephalic, no lesions, without obvious abnormality.  Normal external eye and conjunctiva.  Normal TM's bilaterally.  Normal auditory canals and external ears. Normal external nose, mucus membranes and septum.  Normal pharynx. Cardiovascular- S1, S2 normal, pulses palpable throughout   Lungs- chest clear, no wheezing, rales, normal symmetric air entry Abdomen- normal findings: bowel sounds normal Extremities- no edema Lymph-no adenopathy palpable Musculoskeletal-no joint tenderness, deformity or swelling Skin-warm and dry, no hyperpigmentation, vitiligo, or suspicious lesions  Neurological Examination Mental Status: Alert, not oriented to place, month, year, state.  speech fluent without evidence of aphasia.  Laughs inappropriately and follows some commands but still has difficulty doing this due to lack of concentration cranial Nerves: II: Visual fields grossly normal,  III,IV, VI: ptosis not present, extra-ocular motions intact bilaterally pupils equal, round, reactive to light and accommodation V,VII: smile symmetric, facial light touch sensation normal bilaterally VIII: hearing normal bilaterally IX,X: uvula rises symmetrically XI: bilateral shoulder shrug XII: midline tongue extension Motor: Right : Upper extremity   5/5    Left:     Upper extremity   5/5  Lower extremity   5/5     Lower extremity   5/5 Tone and bulk:normal tone throughout; no atrophy noted Sensory: Pinprick and light touch intact throughout, bilaterally Deep Tendon Reflexes: 2+ and symmetric throughout upper extremities with decreased deep tendon reflexes in the lower extremities Plantars: Right: downgoing   Left: downgoing Cerebellar: normal finger-to-nose, not take part in heel to shin as she was having difficulty concentrating and was laughing inappropriately Gait:  Not tested      Lab Results: Basic Metabolic Panel: Recent Labs  Lab 05/06/17 2236 05/07/17 0415  NA  140 141  K 3.6 3.3*  CL 107 108  CO2 21* 22  GLUCOSE 144* 111*  BUN 16 18  CREATININE 1.24* 1.10*  CALCIUM 10.2 9.3    Liver Function Tests: No results for input(s): AST, ALT, ALKPHOS, BILITOT, PROT, ALBUMIN in the last 168 hours. No results for input(s): LIPASE, AMYLASE in the last 168 hours. No results for input(s): AMMONIA in the last 168 hours.  CBC: Recent Labs  Lab 05/06/17 2236 05/07/17 0415  WBC 10.0 6.2  NEUTROABS 8.3*  --   HGB 13.6 11.4*  HCT 40.9 35.6*  MCV 93.4 94.2  PLT 214 151    Cardiac Enzymes: Recent Labs  Lab 05/06/17 2236 05/07/17 0415  CKTOTAL  --  77  TROPONINI 0.10* 0.06*    Lipid Panel: No results for input(s): CHOL, TRIG, HDL, CHOLHDL, VLDL, LDLCALC in the last 168 hours.  CBG: No results for input(s): GLUCAP in the last 168 hours.  Microbiology: Results for orders placed or performed in visit on 02/21/17  Urine culture     Status: None   Collection Time: 02/21/17  3:47 PM  Result Value Ref Range Status   Organism ID, Bacteria   Final    Multiple organisms present,each less than 10,000 CFU/mL. These organisms,commonly found on external and internal genitalia,are considered colonizers. No further testing performed.     Coagulation Studies: No results for input(s): LABPROT, INR in the last 72 hours.  Imaging: Dg Chest 2 View  Result Date: 05/06/2017 CLINICAL DATA:  Fall with back bruising EXAM: CHEST  2 VIEW COMPARISON:  01/05/2011 FINDINGS: Minimal scarring at the left base. No acute consolidation or effusion. Cardiomegaly. Aortic atherosclerosis. No pneumothorax. Chronic thoracic compression deformities. IMPRESSION: 1. Cardiomegaly without edema or infiltrate 2. Chronic compression deformities of the thoracic spine Electronically Signed   By: Donavan Foil M.D.   On: 05/06/2017 23:53   Dg Thoracic Spine 2 View  Result Date: 05/06/2017 CLINICAL DATA:  Fall with back bruising EXAM: THORACIC SPINE 2 VIEWS COMPARISON:  01/05/2011  FINDINGS: Eleven rib pairs. Chronic moderate severe compression fracture at T8 with mild to moderate compression fracture at T7 also chronic. Chronic mild compression of T11. Mild degenerative changes. IMPRESSION: 1. No definite acute osseous abnormality 2. Multiple chronic compression fractures at T7, T8 and T11 Electronically Signed   By: Donavan Foil M.D.   On: 05/06/2017 23:49   Dg Lumbar Spine Complete  Result Date: 05/06/2017 CLINICAL DATA:  Fall with bruising EXAM: LUMBAR SPINE - COMPLETE 4+ VIEW COMPARISON:  CT 11/07/2011 FINDINGS: Trace anterolisthesis of L4 on L5. Moderate degenerative changes at L4-L5 and L5-S1. Mild chronic compression at T11. Aortic atherosclerosis. IMPRESSION: No definite acute osseous abnormality Electronically Signed   By: Donavan Foil M.D.   On: 05/06/2017 23:51   Dg Pelvis 1-2 Views  Result Date: 05/07/2017 CLINICAL DATA:  Status post fall yesterday without recollection of details. EXAM: PELVIS - 1-2 VIEW COMPARISON:  Sacrum and coccyx series of today's date and coronal and sagittal CT images through the pelvis from a scan of Nov 07, 2011 FINDINGS: The bony pelvis is subjectively mildly osteopenic. No acute fracture is observed. The observed portions of the sacrum are normal. The pubic rami are intact. As best as can be determined the hips are intact. IMPRESSION: No acute bony abnormality of the pelvis is observed. Electronically Signed  By: David  Martinique M.D.   On: 05/07/2017 09:07   Dg Sacrum/coccyx  Result Date: 05/06/2017 CLINICAL DATA:  Fall with bruising EXAM: SACRUM AND COCCYX - 2+ VIEW COMPARISON:  11/07/2011 FINDINGS: SI joint arthritis. No widening. Pubic symphysis and rami appear intact. No definite acute displaced fracture. IMPRESSION: No definite acute osseous abnormality Electronically Signed   By: Donavan Foil M.D.   On: 05/06/2017 23:52   Dg Scapula Right  Result Date: 05/06/2017 CLINICAL DATA:  Bruising after fall. EXAM: RIGHT SCAPULA - 2+ VIEWS  COMPARISON:  Chest CT 01/08/2011 FINDINGS: There is no evidence of fracture or other focal bone lesions. Osteoarthritis of the adjacent acromioclavicular joint. The adjacent ribs are intact. No pneumothorax or pulmonary consolidation. There is thoracic spondylosis with chronic T8 compression based on remote CT from 2012. IMPRESSION: No acute scapular fracture. 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 Electronically Signed   By: Ashley Royalty M.D.   On: 05/06/2017 23:45   Ct Head Wo Contrast  Result Date: 05/06/2017 CLINICAL DATA:  [place] at home scalp hematoma EXAM: CT HEAD WITHOUT CONTRAST CT CERVICAL SPINE WITHOUT CONTRAST TECHNIQUE: Multidetector CT imaging of the head and cervical spine was performed following the standard protocol without intravenous contrast. Multiplanar CT image reconstructions of the cervical spine were also generated. COMPARISON:  MRI 03/01/2016, CT brain 01/05/2011 FINDINGS: CT HEAD FINDINGS Brain: No acute territorial infarction, hemorrhage, or intracranial mass is visualized. Moderate atrophy. Mild small vessel ischemic changes of the white matter. Stable ventricle size. Vascular: No hyperdense vessels. Vertebral artery and carotid artery calcifications. 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 Skull: No skull fracture. Sinuses/Orbits: No acute finding. Other: Large posterior scalp swelling. CT CERVICAL SPINE FINDINGS Alignment: Trace anterolisthesis of C7 on T1. Mild straightening. Facet alignment is within normal limits. Skull base and vertebrae: Craniovertebral junction is intact. There is no displaced fracture. Soft tissues and spinal canal: No prevertebral fluid or swelling. No visible canal hematoma. Disc levels: Moderate severe multilevel degenerative disc changes from C4 through C7. Multilevel bilateral facet arthropathy with right greater than left foraminal stenosis. Upper chest:  Lung apices are clear. Calcified nodule left lobe of thyroid. 2.3 cm hypodense nodule in the region of right lobe of thyroid. Other: None IMPRESSION: 1. No CT evidence for acute intracranial abnormality. Large posterior scalp soft tissue swelling 2. Trace anterolisthesis of C7 on T1.  No fracture. 3. 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 4. Bilateral thyroid nodules. Nonemergent thyroid ultrasound correlation as indicated. Electronically Signed   By: Donavan Foil M.D.   On: 05/06/2017 23:10   Ct Cervical Spine Wo Contrast  Result Date: 05/06/2017 CLINICAL DATA:  [place] at home scalp hematoma EXAM: CT HEAD WITHOUT CONTRAST CT CERVICAL SPINE WITHOUT CONTRAST TECHNIQUE: Multidetector CT imaging of the head and cervical spine was performed following the standard protocol without intravenous contrast. Multiplanar CT image reconstructions of the cervical spine were also generated. COMPARISON:  MRI 03/01/2016, CT brain 01/05/2011 FINDINGS: CT HEAD FINDINGS Brain: No acute territorial infarction, hemorrhage, or intracranial mass is visualized. Moderate atrophy. Mild small vessel ischemic changes of the white matter. Stable ventricle size. Vascular: No hyperdense vessels. Vertebral artery and carotid artery calcifications. 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 Skull: No skull fracture. Sinuses/Orbits: No acute finding. Other: Large posterior scalp swelling. CT CERVICAL SPINE  FINDINGS Alignment: Trace anterolisthesis of C7 on T1. Mild straightening. Facet alignment is within normal limits. Skull base and vertebrae: Craniovertebral junction is intact. There is no displaced fracture. Soft tissues and spinal canal: No prevertebral fluid or swelling. No visible canal hematoma. Disc levels: Moderate severe multilevel degenerative disc changes from C4 through C7. Multilevel bilateral facet arthropathy  with right greater than left foraminal stenosis. Upper chest: Lung apices are clear. Calcified nodule left lobe of thyroid. 2.3 cm hypodense nodule in the region of right lobe of thyroid. Other: None IMPRESSION: 1. No CT evidence for acute intracranial abnormality. Large posterior scalp soft tissue swelling 2. Trace anterolisthesis of C7 on T1.  No fracture. 3. 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 4. Bilateral thyroid nodules. Nonemergent thyroid ultrasound correlation as indicated. Electronically Signed   By: Donavan Foil M.D.   On: 05/06/2017 23:10   Mr Jodene Nam Neck Wo Contrast  Result Date: 05/07/2017 CLINICAL DATA:  Near syncope versus syncope. Fall with multiple bruises. EXAM: MR HEAD WITHOUT CONTRAST MRA OF THE NECK WITHOUT CONTRAST TECHNIQUE: Multiplanar, multiecho pulse sequences of the brain and surrounding structures were obtained without intravenous contrast. Angiographic images of the neck were obtained using MRA technique without intravenous contrast. COMPARISON:  Head CT 05/06/2017.  Head MRI/ MRA 03/01/2016. FINDINGS: MR HEAD FINDINGS Multiple sequences are mildly motion degraded. Brain: There is no evidence of acute infarct, intracranial hemorrhage, mass, midline shift, or extra-axial fluid collection. There is moderate cerebral atrophy. Patchy T2 hyperintensities in the cerebral white matter and pons are similar to the prior MRI and nonspecific but compatible with chronic small vessel ischemic disease, relatively mild for age. 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 Vascular: Major intracranial vascular flow voids are preserved. Fusiform aneurysmal dilatation of the right supraclinoid ICA to terminus appears stable to slightly greater than on the prior MRI. Skull and upper cervical spine: No suspicious marrow lesion. Sinuses/Orbits: Unremarkable orbits. Paranasal sinuses and mastoid air cells  are clear. Other: Moderate posterior scalp swelling/hematoma. MRA NECK FINDINGS Image quality is degraded by motion artifact and noncontrast technique. The common carotid and cervical internal carotid arteries are patent bilaterally without evidence of flow limiting stenosis. Limited intracranial imaging demonstrates fusiform aneurysmal dilatation of the right supraclinoid ICA to terminus measuring 8 mm, stable to minimally increased from the prior MRA. The vertebral arteries are patent with antegrade flow bilaterally. The proximal vertebral arteries are not well evaluated due to artifact, limiting evaluation for stenosis. The left V2 segment appears widely patent. Mild irregular narrowing versus artifact is present in the distal right V2 and proximal V3 segments. There is a suspected severe distal right V4 stenosis, and there may also be moderate to severe distal left V4 narrowing. IMPRESSION: 1. No acute intracranial abnormality. 2. Mild chronic small vessel ischemic disease. 3. Neck MRA limited by motion and noncontrast technique as detailed above. No flow limiting cervical carotid artery stenosis. 4. 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 5. Suspected moderate to severe distal bilateral vertebral artery stenoses. Electronically Signed   By: Logan Bores M.D.   On: 05/07/2017 10:20   Mr Brain Wo Contrast  Result Date: 05/07/2017 CLINICAL DATA:  Near syncope versus syncope. Fall with multiple bruises. EXAM: MR HEAD WITHOUT CONTRAST MRA OF THE NECK WITHOUT CONTRAST TECHNIQUE: Multiplanar, multiecho pulse sequences of the brain and surrounding structures were obtained without intravenous contrast.  Angiographic images of the neck were obtained using MRA technique without intravenous contrast. COMPARISON:  Head CT 05/06/2017.  Head MRI/ MRA 03/01/2016. FINDINGS: MR HEAD FINDINGS Multiple sequences are mildly motion degraded. Brain: There is no evidence of acute infarct,  intracranial hemorrhage, mass, midline shift, or extra-axial fluid collection. There is moderate cerebral atrophy. Patchy T2 hyperintensities in the cerebral white matter and pons are similar to the prior MRI and nonspecific but compatible with chronic small vessel ischemic disease, relatively mild for age. 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 Vascular: Major intracranial vascular flow voids are preserved. Fusiform aneurysmal dilatation of the right supraclinoid ICA to terminus appears stable to slightly greater than on the prior MRI. Skull and upper cervical spine: No suspicious marrow lesion. Sinuses/Orbits: Unremarkable orbits. Paranasal sinuses and mastoid air cells are clear. Other: Moderate posterior scalp swelling/hematoma. MRA NECK FINDINGS Image quality is degraded by motion artifact and noncontrast technique. The common carotid and cervical internal carotid arteries are patent bilaterally without evidence of flow limiting stenosis. Limited intracranial imaging demonstrates fusiform aneurysmal dilatation of the right supraclinoid ICA to terminus measuring 8 mm, stable to minimally increased from the prior MRA. The vertebral arteries are patent with antegrade flow bilaterally. The proximal vertebral arteries are not well evaluated due to artifact, limiting evaluation for stenosis. The left V2 segment appears widely patent. Mild irregular narrowing versus artifact is present in the distal right V2 and proximal V3 segments. There is a suspected severe distal right V4 stenosis, and there may also be moderate to severe distal left V4 narrowing. IMPRESSION: 1. No acute intracranial abnormality. 2. Mild chronic small vessel ischemic disease. 3. Neck MRA limited by motion and noncontrast technique as detailed above. No flow limiting cervical carotid artery stenosis. 4. 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 5. Suspected moderate to severe distal  bilateral vertebral artery stenoses. Electronically Signed   By: Logan Bores M.D.   On: 05/07/2017 10:20   US Abdomen Complete  Result Date: 05/07/2017 CLINICAL DATA:  Nausea since yesterday. EXAM: ABDOMEN ULTRASOUND COMPLETE COMPARISON:  Abdominal and pelvic CT scan of Nov 07, 2011 FINDINGS: The study is limited due to the patient's body habitus. Gallbladder: The gallbladder is contracted. There is a stone present measuring 1.8 cm in diameter. There is no gallbladder wall thickening, pericholecystic fluid, or positive sonographic Murphy's sign. Common bile duct: Diameter: 4.6 mm Liver: The hepatic echotexture is normal. In the left lobe there is a 3.4 x 2.5 x 2.5 cm mixed echogenicity region which likely corresponds to a probable hemangioma on the May 2013 CT scan and also seen on the previous ultrasound. Portal vein is patent on color Doppler imaging with normal direction of blood flow towards the liver. IVC: No abnormality visualized. Pancreas: Visualized portion unremarkable. Spleen: Size and appearance within normal limits. Right Kidney: Length: 10.3 cm. The renal cortical echotexture is increased and exceeds that of the liver. There is a cyst in the midpole of the right kidney measuring 5.1 x 4.0 x 4.0 cm. There is no hydronephrosis. Left Kidney: Length: 10.2 cm. The renal cortical echotexture is increased similar to that on the right. Abdominal aorta: No aneurysm is observed.  There is mural plaque. Other findings: No ascites is demonstrated. IMPRESSION: Gallstones within a contracted gallbladder. No sonographic evidence of acute cholecystitis. Probable hemangioma within the medial aspect of the left hepatic lobe not significantly changed since the 2013 CT scan and ultrasound. Increased renal cortical  echotexture bilaterally consistent with medical renal disease. No hydronephrosis. Electronically Signed   By: David  Martinique M.D.   On: 05/07/2017 12:30   Dg Foot 2 Views Left  Result Date:  05/07/2017 CLINICAL DATA:  Dorsal and lateral bruising after a fall yesterday. EXAM: LEFT FOOT - 2 VIEW COMPARISON:  Report of a foot series of September 14, 2001. FINDINGS: The bones are subjectively mildly osteopenic. The phalanges appear intact. The metatarsals are intact. The tarsal bones exhibit no acute abnormalities. There is a plantar calcaneal spur. There are mild degenerative changes of the lateral tarsometatarsal joints. The soft tissues exhibit no acute findings. Minimal swelling dorsally may be present. IMPRESSION: No acute bony abnormality of the left foot is observed. Electronically Signed   By: David  Martinique M.D.   On: 05/07/2017 09:05       Assessment and plan per attending neurologist  Etta Quill PA-C [facility] [phone]  05/07/2017, 3:02 PM   Assessment/Plan:  83 year old female who is been having episodes infrequently possibly once a year to every 2 years.  Episodes are stereotypical, each episodes is consistent with her getting diaphoretic pale eyes rolled back and not coherent for approximately 30 seconds to a minute and then a short period of confusion to which she then comes back to normal.  Unfortunately family has not recorded blood pressures during these events and patient is significantly demented and cannot give any history of what she felt during these previous events.  At this point given the description, these events sound more cardiogenic possibly arrhythmia less so seizure.  However etiology does remain unclear.  Unfortunately is unlikely to capture any of these events on EEG as the happen so far and few between, but given the fact that she does have dementia which puts her at a higher risk of seizures,  I have discussed starting a antiepileptic medication with Wendy Krueger.  She is in agreement with trying a trial of Keppra 500 mg twice daily to see if these events decreased.  Discussion about side effects of Keppra were also had in  depth with brand of the POA and she understands.  In addition I have talked to Dr. Posey Pronto who is going to set her up for a Holter monitor with.  Patient has a office visit with Dr. Lavell Anchors in December.     NEUROHOSPITALIST ADDENDUM Seen and examined the patient and spoke with son. Episodes appear to be more syncope and recommend cardiac workup and Holter monitor. Can do trial of Keppra 500 mg BID and follow up with Dr Lavell Anchors.   Karena Addison Lauralye Kinn MD [facility] [phone]  If 7pm to 7am, please call on call as listed on AMION.

## 2017-05-07 NOTE — Care Management Note (Signed)
Case Management Note  Patient Details  Name: Wendy Krueger MRN: [id number] Date of Birth: 11/18/1929  Subjective/Objective:   Pt in with near syncope. She is from home with her daughter.                Action/Plan: Awaiting PT/OT evals. CM following for d/c needs, physician orders.   Expected Discharge Date:  05/11/17               Expected Discharge Plan:     In-House Referral:     Discharge planning Services     Post Acute Care Choice:    Choice offered to:     DME Arranged:    DME Agency:     HH Arranged:    HH Agency:     Status of Service:  In process, will continue to follow  If discussed at Long Length of Stay Meetings, dates discussed:    Additional Comments:  Pollie Friar, RN 05/07/2017, 2:14 PM

## 2017-05-07 NOTE — H&P (Addendum)
History and Physical    Wendy Krueger VHQ:[id number] DOB: Oct 30, 1929 DOA: 05/06/2017  PCP: Abner Greenspan, MD  Patient coming from: Home.  History obtained from patient's daughter since patient has dementia and cannot contribute to the history.  Chief Complaint: Multiple bruises on the body.  HPI: Wendy Krueger is a 83 y.o. female with history of dementia, hypertension, hyperlipidemia was brought to the ER after patient's daughter found that patient had multiple bruises on returning back home last evening.  At around 7 PM patient's daughter who lives with the patient returned back home and found the patient had multiple bruises on her body including the scalp and extremities and upper back.  Since patient has dementia it is not clear where she fell.  Patient otherwise did not complain of any chest pain shortness of breath nausea vomiting diarrhea abdominal pain did not have any fever or chills or any change in her medications recently.  ED Course: In the ER patient had CT of the head and neck followed by x-rays of the spine chest x-ray which all did not show anything acute.  While waiting in the ER patient's daughter noticed that patient had an episode of flushing diaphoresis and nausea.  This lasted for around 20 minutes.  Resolved without any intervention.  Patient admitted for further observation for possible near syncope versus syncope.  Review of Systems: As per HPI, rest all negative.   Past Medical History:  Diagnosis Date  . Asymptomatic gallstones   . Colitis - presumed infectious origin    one ER visit   . Dementia   . Hyperlipidemia   . Hypertension   . Osteoarthritis   . Osteoporosis     Past Surgical History:  Procedure Laterality Date  . ABDOMINAL HYSTERECTOMY     BSO- fibroids  . ABI's     normal  . dexa  1/05   osteoporosis  . left foot brace    . WRIST FRACTURE SURGERY  2/01   R arm     reports that  has never smoked. she has never used smokeless  tobacco. She reports that she does not drink alcohol or use drugs.  Allergies  Allergen Reactions  . Alendronate Sodium     Leg pain   . Amlodipine Besylate Hives  . Calcitonin ([place]) Other (See Comments)     headache/ head pressure  . Simvastatin Other (See Comments)    Leg pain     Family History  Problem Relation Age of Onset  . Heart attack Father   . Hypertension Father   . Heart attack Mother   . Throat cancer Brother     Prior to Admission medications   Medication Sig Start Date End Date Taking? Authorizing Provider  Cholecalciferol (VITAMIN D-3) 1000 units CAPS Take 1,000-2,000 capsules See admin instructions by mouth. Take 1000 units in the morning and 2000 in the evening   Yes [provider]  clopidogrel (PLAVIX) 75 MG tablet TAKE 1 TABLET BY MOUTH EVERY DAY Patient taking differently: TAKE 75 mg TABLET BY MOUTH EVERY DAY 02/27/17  Yes Millikan, Megan, NP  donepezil (ARICEPT) 10 MG tablet Take 1 tablet (10 mg total) by mouth at bedtime. 06/06/16  Yes Melvenia Beam, MD  Fexofenadine HCl (ALLERGY 24-HR PO) Take 1 capsule by mouth daily.   Yes [provider]  hydrochlorothiazide (HYDRODIURIL) 25 MG tablet Take 1 tablet (25 mg total) by mouth daily. 04/16/17  Yes Tower, Wynelle Fanny, MD  lisinopril (  PRINIVIL,ZESTRIL) 20 MG tablet Take 1 tablet (20 mg total) by mouth daily. 04/16/17  Yes Tower, Wynelle Fanny, MD  memantine (NAMENDA) 10 MG tablet Take 1 tablet (10 mg total) by mouth 2 (two) times daily. 06/06/16  Yes Melvenia Beam, MD  metoprolol tartrate (LOPRESSOR) 50 MG tablet TAKE 1 TABLET (50 MG TOTAL) BY MOUTH 2 (TWO) TIMES DAILY. 04/16/17  Yes Tower, Wynelle Fanny, MD  Multiple Vitamins-Minerals ([employer] SPECTRAVITE PO) Take 1 capsule by mouth daily.   Yes [provider]  potassium chloride (KLOR-CON M10) 10 MEQ tablet Take 2 tablets (20 mEq total) by mouth daily. Patient taking differently: Take 20 mEq at bedtime by mouth.  04/16/17  Yes Tower, Wynelle Fanny, MD    vitamin B-12 (CYANOCOBALAMIN) 1000 MCG tablet Take 1,000 mcg by mouth daily.   Yes [provider]    Physical Exam: Vitals:   05/06/17 2215 05/06/17 2230 05/07/17 0100 05/07/17 0139  BP:  118/64 (!) 154/64 (!) 89/50  Pulse:  85 64 67  Resp:  18 18 18   Temp:    98.4 F (36.9 C)  TempSrc:    Oral  SpO2: 99% 97% 100% 98%  Weight:      Height:          Constitutional: Moderately built and nourished. Vitals:   05/06/17 2215 05/06/17 2230 05/07/17 0100 05/07/17 0139  BP:  118/64 (!) 154/64 (!) 89/50  Pulse:  85 64 67  Resp:  18 18 18   Temp:    98.4 F (36.9 C)  TempSrc:    Oral  SpO2: 99% 97% 100% 98%  Weight:      Height:       Eyes: Anicteric no pallor. ENMT: No discharge from the ears eyes nose or mouth. Neck: No mass felt.  No neck rigidity. Respiratory: No rhonchi or crepitations. Cardiovascular: S1-S2 heard no murmurs appreciated. Abdomen: Soft nontender bowel sounds present. Musculoskeletal: No edema.  No joint effusion. Skin: Multiple bruises including upper extremities left foot upper back and scalp. Neurologic: Alert awake oriented to her name moves all extremities 5 x 5.  No facial asymmetry. Psychiatric: Has dementia.   Labs on Admission: I have personally reviewed following labs and imaging studies  CBC: Recent Labs  Lab 05/06/17 2236  WBC 10.0  NEUTROABS 8.3*  HGB 13.6  HCT 40.9  MCV 93.4  PLT 242   Basic Metabolic Panel: Recent Labs  Lab 05/06/17 2236  NA 140  K 3.6  CL 107  CO2 21*  GLUCOSE 144*  BUN 16  CREATININE 1.24*  CALCIUM 10.2   GFR: Estimated Creatinine Clearance: 29.9 mL/min (A) (by C-G formula based on SCr of 1.24 mg/dL (H)). Liver Function Tests: No results for input(s): AST, ALT, ALKPHOS, BILITOT, PROT, ALBUMIN in the last 168 hours. No results for input(s): LIPASE, AMYLASE in the last 168 hours. No results for input(s): AMMONIA in the last 168 hours. Coagulation Profile: No results for input(s): INR,  PROTIME in the last 168 hours. Cardiac Enzymes: Recent Labs  Lab 05/06/17 2236  TROPONINI 0.10*   BNP (last 3 results) No results for input(s): PROBNP in the last 8760 hours. HbA1C: No results for input(s): HGBA1C in the last 72 hours. CBG: No results for input(s): GLUCAP in the last 168 hours. Lipid Profile: No results for input(s): CHOL, HDL, LDLCALC, TRIG, CHOLHDL, LDLDIRECT in the last 72 hours. Thyroid Function Tests: No results for input(s): TSH, T4TOTAL, FREET4, T3FREE, THYROIDAB in the last 72 hours. Anemia Panel:  No results for input(s): VITAMINB12, FOLATE, FERRITIN, TIBC, IRON, RETICCTPCT in the last 72 hours. Urine analysis:    Component Value Date/Time   COLORURINE YELLOW 05/06/2017 2110   APPEARANCEUR CLEAR 05/06/2017 2110   LABSPEC 1.021 05/06/2017 2110   PHURINE 5.0 05/06/2017 2110   GLUCOSEU NEGATIVE 05/06/2017 2110   HGBUR NEGATIVE 05/06/2017 2110   BILIRUBINUR NEGATIVE 05/06/2017 2110   BILIRUBINUR negative 02/21/2017 1628   KETONESUR NEGATIVE 05/06/2017 2110   PROTEINUR 30 (A) 05/06/2017 2110   UROBILINOGEN 0.2 03/20/2014 0753   NITRITE NEGATIVE 05/06/2017 2110   LEUKOCYTESUR SMALL (A) 05/06/2017 2110   Sepsis Labs: @LABRCNTIP (procalcitonin:4,lacticidven:4) )No results found for this or any previous visit (from the past 240 hour(s)).   Radiological Exams on Admission: Dg Chest 2 View  Result Date: 05/06/2017 CLINICAL DATA:  Fall with back bruising EXAM: CHEST  2 VIEW COMPARISON:  01/05/2011 FINDINGS: Minimal scarring at the left base. No acute consolidation or effusion. Cardiomegaly. Aortic atherosclerosis. No pneumothorax. Chronic thoracic compression deformities. IMPRESSION: 1. Cardiomegaly without edema or infiltrate 2. Chronic compression deformities of the thoracic spine Electronically Signed   By: Donavan Foil M.D.   On: 05/06/2017 23:53   Dg Thoracic Spine 2 View  Result Date: 05/06/2017 CLINICAL DATA:  Fall with back bruising EXAM: THORACIC  SPINE 2 VIEWS COMPARISON:  01/05/2011 FINDINGS: Eleven rib pairs. Chronic moderate severe compression fracture at T8 with mild to moderate compression fracture at T7 also chronic. Chronic mild compression of T11. Mild degenerative changes. IMPRESSION: 1. No definite acute osseous abnormality 2. Multiple chronic compression fractures at T7, T8 and T11 Electronically Signed   By: Donavan Foil M.D.   On: 05/06/2017 23:49   Dg Lumbar Spine Complete  Result Date: 05/06/2017 CLINICAL DATA:  Fall with bruising EXAM: LUMBAR SPINE - COMPLETE 4+ VIEW COMPARISON:  CT 11/07/2011 FINDINGS: Trace anterolisthesis of L4 on L5. Moderate degenerative changes at L4-L5 and L5-S1. Mild chronic compression at T11. Aortic atherosclerosis. IMPRESSION: No definite acute osseous abnormality Electronically Signed   By: Donavan Foil M.D.   On: 05/06/2017 23:51   Dg Sacrum/coccyx  Result Date: 05/06/2017 CLINICAL DATA:  Fall with bruising EXAM: SACRUM AND COCCYX - 2+ VIEW COMPARISON:  11/07/2011 FINDINGS: SI joint arthritis. No widening. Pubic symphysis and rami appear intact. No definite acute displaced fracture. IMPRESSION: No definite acute osseous abnormality Electronically Signed   By: Donavan Foil M.D.   On: 05/06/2017 23:52   Dg Scapula Right  Result Date: 05/06/2017 CLINICAL DATA:  Bruising after fall. EXAM: RIGHT SCAPULA - 2+ VIEWS COMPARISON:  Chest CT 01/08/2011 FINDINGS: There is no evidence of fracture or other focal bone lesions. Osteoarthritis of the adjacent acromioclavicular joint. The adjacent ribs are intact. No pneumothorax or pulmonary consolidation. There is thoracic spondylosis with chronic T8 compression based on remote CT from 2012. IMPRESSION: No acute scapular fracture. 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 Electronically Signed   By: Ashley Royalty M.D.   On: 05/06/2017 23:45   Ct Head Wo Contrast  Result Date: 05/06/2017 CLINICAL DATA:   [place] at home scalp hematoma EXAM: CT HEAD WITHOUT CONTRAST CT CERVICAL SPINE WITHOUT CONTRAST TECHNIQUE: Multidetector CT imaging of the head and cervical spine was performed following the standard protocol without intravenous contrast. Multiplanar CT image reconstructions of the cervical spine were also generated. COMPARISON:  MRI 03/01/2016, CT brain 01/05/2011 FINDINGS: CT HEAD FINDINGS Brain: No acute territorial infarction, hemorrhage, or intracranial mass is visualized. Moderate atrophy. Mild  small vessel ischemic changes of the white matter. Stable ventricle size. Vascular: No hyperdense vessels. Vertebral artery and carotid artery calcifications. 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 Skull: No skull fracture. Sinuses/Orbits: No acute finding. Other: Large posterior scalp swelling. CT CERVICAL SPINE FINDINGS Alignment: Trace anterolisthesis of C7 on T1. Mild straightening. Facet alignment is within normal limits. Skull base and vertebrae: Craniovertebral junction is intact. There is no displaced fracture. Soft tissues and spinal canal: No prevertebral fluid or swelling. No visible canal hematoma. Disc levels: Moderate severe multilevel degenerative disc changes from C4 through C7. Multilevel bilateral facet arthropathy with right greater than left foraminal stenosis. Upper chest: Lung apices are clear. Calcified nodule left lobe of thyroid. 2.3 cm hypodense nodule in the region of right lobe of thyroid. Other: None IMPRESSION: 1. No CT evidence for acute intracranial abnormality. Large posterior scalp soft tissue swelling 2. Trace anterolisthesis of C7 on T1.  No fracture. 3. 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 4. Bilateral thyroid nodules. Nonemergent thyroid ultrasound correlation as indicated. Electronically Signed   By: Donavan Foil M.D.   On: 05/06/2017 23:10   Ct  Cervical Spine Wo Contrast  Result Date: 05/06/2017 CLINICAL DATA:  [place] at home scalp hematoma EXAM: CT HEAD WITHOUT CONTRAST CT CERVICAL SPINE WITHOUT CONTRAST TECHNIQUE: Multidetector CT imaging of the head and cervical spine was performed following the standard protocol without intravenous contrast. Multiplanar CT image reconstructions of the cervical spine were also generated. COMPARISON:  MRI 03/01/2016, CT brain 01/05/2011 FINDINGS: CT HEAD FINDINGS Brain: No acute territorial infarction, hemorrhage, or intracranial mass is visualized. Moderate atrophy. Mild small vessel ischemic changes of the white matter. Stable ventricle size. Vascular: No hyperdense vessels. Vertebral artery and carotid artery calcifications. 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 Skull: No skull fracture. Sinuses/Orbits: No acute finding. Other: Large posterior scalp swelling. CT CERVICAL SPINE FINDINGS Alignment: Trace anterolisthesis of C7 on T1. Mild straightening. Facet alignment is within normal limits. Skull base and vertebrae: Craniovertebral junction is intact. There is no displaced fracture. Soft tissues and spinal canal: No prevertebral fluid or swelling. No visible canal hematoma. Disc levels: Moderate severe multilevel degenerative disc changes from C4 through C7. Multilevel bilateral facet arthropathy with right greater than left foraminal stenosis. Upper chest: Lung apices are clear. Calcified nodule left lobe of thyroid. 2.3 cm hypodense nodule in the region of right lobe of thyroid. Other: None IMPRESSION: 1. No CT evidence for acute intracranial abnormality. Large posterior scalp soft tissue swelling 2. Trace anterolisthesis of C7 on T1.  No fracture. 3. 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 4. Bilateral thyroid nodules. Nonemergent thyroid ultrasound correlation as indicated. Electronically  Signed   By: Donavan Foil M.D.   On: 05/06/2017 23:10    EKG: Independently reviewed.  Normal sinus rhythm with LBBB.  Assessment/Plan Principal Problem:   Near syncope Active Problems:   Dementia   Fall   Contusion of scalp   Contusion of sacrum   Contusion of right side of back    1. Possible near syncope versus syncope and fall with multiple bruises -will observe in telemetry check 2D echo cycle cardiac markers get physical therapy consult.  Check EEG and MRI of the brain. 2. Nausea with poor appetite -patient's daughter was saying that  patient has been having nausea with poor appetite recently will check sonogram of the abdomen. 3. Elevated troponin -cycle cardiac markers.  Check 2D echo.  Patient denies any chest pain. 4. 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  I have ordered MRA of the neck. 5. Hypertension -hold hydrochlorothiazide continue with lisinopril and metoprolol. 6. History of dementia on Aricept and Namenda. 7. Chronic kidney disease stage II creatinine appears to be at baseline.  Follow metabolic panel.  X-ray of the left foot to pelvis MRI of the brain MRA of the neck are pending.   DVT prophylaxis: SCDs. Code Status: Full code. Family Communication: Daughters. Disposition Plan: Home. Consults called: Physical therapy. Admission status: Observation.   Rise Patience MD [facility] Pager [phone].  If 7PM-7AM, please contact night-coverage [email or web address] Password [email or web address]  05/07/2017, 3:56 AM

## 2017-05-08 ENCOUNTER — Observation Stay (HOSPITAL_COMMUNITY): Payer: Medicare Other

## 2017-05-08 DIAGNOSIS — S20229A Contusion of unspecified back wall of thorax, initial encounter: Secondary | ICD-10-CM | POA: Diagnosis present

## 2017-05-08 DIAGNOSIS — I129 Hypertensive chronic kidney disease with stage 1 through stage 4 chronic kidney disease, or unspecified chronic kidney disease: Secondary | ICD-10-CM | POA: Diagnosis present

## 2017-05-08 DIAGNOSIS — K5900 Constipation, unspecified: Secondary | ICD-10-CM | POA: Diagnosis present

## 2017-05-08 DIAGNOSIS — R402412 Glasgow coma scale score 13-15, at arrival to emergency department: Secondary | ICD-10-CM | POA: Diagnosis present

## 2017-05-08 DIAGNOSIS — R55 Syncope and collapse: Secondary | ICD-10-CM | POA: Diagnosis not present

## 2017-05-08 DIAGNOSIS — I499 Cardiac arrhythmia, unspecified: Secondary | ICD-10-CM | POA: Diagnosis present

## 2017-05-08 DIAGNOSIS — R7989 Other specified abnormal findings of blood chemistry: Secondary | ICD-10-CM | POA: Diagnosis present

## 2017-05-08 DIAGNOSIS — S0003XA Contusion of scalp, initial encounter: Secondary | ICD-10-CM | POA: Diagnosis present

## 2017-05-08 DIAGNOSIS — N182 Chronic kidney disease, stage 2 (mild): Secondary | ICD-10-CM | POA: Diagnosis present

## 2017-05-08 DIAGNOSIS — Z6832 Body mass index (BMI) 32.0-32.9, adult: Secondary | ICD-10-CM | POA: Diagnosis not present

## 2017-05-08 DIAGNOSIS — W19XXXA Unspecified fall, initial encounter: Secondary | ICD-10-CM | POA: Diagnosis present

## 2017-05-08 DIAGNOSIS — F0390 Unspecified dementia without behavioral disturbance: Secondary | ICD-10-CM | POA: Diagnosis present

## 2017-05-08 DIAGNOSIS — G9340 Encephalopathy, unspecified: Secondary | ICD-10-CM | POA: Diagnosis present

## 2017-05-08 DIAGNOSIS — R630 Anorexia: Secondary | ICD-10-CM | POA: Diagnosis present

## 2017-05-08 DIAGNOSIS — R569 Unspecified convulsions: Secondary | ICD-10-CM | POA: Diagnosis present

## 2017-05-08 DIAGNOSIS — K8020 Calculus of gallbladder without cholecystitis without obstruction: Secondary | ICD-10-CM | POA: Diagnosis present

## 2017-05-08 DIAGNOSIS — E785 Hyperlipidemia, unspecified: Secondary | ICD-10-CM | POA: Diagnosis present

## 2017-05-08 DIAGNOSIS — R296 Repeated falls: Secondary | ICD-10-CM | POA: Diagnosis present

## 2017-05-08 DIAGNOSIS — R109 Unspecified abdominal pain: Secondary | ICD-10-CM | POA: Diagnosis present

## 2017-05-08 DIAGNOSIS — S300XXA Contusion of lower back and pelvis, initial encounter: Secondary | ICD-10-CM | POA: Diagnosis present

## 2017-05-08 DIAGNOSIS — R110 Nausea: Secondary | ICD-10-CM | POA: Diagnosis present

## 2017-05-08 DIAGNOSIS — R748 Abnormal levels of other serum enzymes: Secondary | ICD-10-CM | POA: Diagnosis not present

## 2017-05-08 DIAGNOSIS — M1990 Unspecified osteoarthritis, unspecified site: Secondary | ICD-10-CM | POA: Diagnosis present

## 2017-05-08 DIAGNOSIS — I720 Aneurysm of carotid artery: Secondary | ICD-10-CM | POA: Diagnosis present

## 2017-05-08 DIAGNOSIS — Z7902 Long term (current) use of antithrombotics/antiplatelets: Secondary | ICD-10-CM | POA: Diagnosis not present

## 2017-05-08 DIAGNOSIS — M810 Age-related osteoporosis without current pathological fracture: Secondary | ICD-10-CM | POA: Diagnosis present

## 2017-05-08 LAB — CBC WITH DIFFERENTIAL/PLATELET
BASOS ABS: 0 10*3/uL (ref 0.0–0.1)
Basophils Relative: 0 %
EOS ABS: 0.1 10*3/uL (ref 0.0–0.7)
EOS PCT: 3 %
HCT: 34.2 % — ABNORMAL LOW (ref 36.0–46.0)
Hemoglobin: 11.1 g/dL — ABNORMAL LOW (ref 12.0–15.0)
Lymphocytes Relative: 31 %
Lymphs Abs: 1.4 10*3/uL (ref 0.7–4.0)
MCH: 30.9 pg (ref 26.0–34.0)
MCHC: 32.5 g/dL (ref 30.0–36.0)
MCV: 95.3 fL (ref 78.0–100.0)
Monocytes Absolute: 0.5 10*3/uL (ref 0.1–1.0)
Monocytes Relative: 11 %
Neutro Abs: 2.6 10*3/uL (ref 1.7–7.7)
Neutrophils Relative %: 55 %
PLATELETS: 143 10*3/uL — AB (ref 150–400)
RBC: 3.59 MIL/uL — AB (ref 3.87–5.11)
RDW: 13.6 % (ref 11.5–15.5)
WBC: 4.6 10*3/uL (ref 4.0–10.5)

## 2017-05-08 LAB — COMPREHENSIVE METABOLIC PANEL
ALT: 13 U/L — AB (ref 14–54)
AST: 24 U/L (ref 15–41)
Albumin: 3.2 g/dL — ABNORMAL LOW (ref 3.5–5.0)
Alkaline Phosphatase: 55 U/L (ref 38–126)
Anion gap: 6 (ref 5–15)
BILIRUBIN TOTAL: 0.8 mg/dL (ref 0.3–1.2)
BUN: 15 mg/dL (ref 6–20)
CO2: 23 mmol/L (ref 22–32)
CREATININE: 1.03 mg/dL — AB (ref 0.44–1.00)
Calcium: 8.8 mg/dL — ABNORMAL LOW (ref 8.9–10.3)
Chloride: 112 mmol/L — ABNORMAL HIGH (ref 101–111)
GFR calc Af Amer: 56 mL/min — ABNORMAL LOW (ref >60)
GFR, EST NON AFRICAN AMERICAN: 48 mL/min — AB (ref >60)
Glucose, Bld: 90 mg/dL (ref 65–99)
POTASSIUM: 3.6 mmol/L (ref 3.5–5.1)
Sodium: 141 mmol/L (ref 135–145)
TOTAL PROTEIN: 5.8 g/dL — AB (ref 6.5–8.1)

## 2017-05-08 LAB — MAGNESIUM: Magnesium: 1.7 mg/dL (ref 1.7–2.4)

## 2017-05-08 LAB — LIPASE, BLOOD: LIPASE: 15 U/L (ref 11–51)

## 2017-05-08 LAB — LACTIC ACID, PLASMA: Lactic Acid, Venous: 1.7 mmol/L (ref 0.5–1.9)

## 2017-05-08 MED ORDER — METOPROLOL TARTRATE 25 MG PO TABS
37.5000 mg | ORAL_TABLET | Freq: Two times a day (BID) | ORAL | Status: DC
  Administered 2017-05-08 – 2017-05-09 (×2): 37.5 mg via ORAL
  Filled 2017-05-08 (×2): qty 1

## 2017-05-08 MED ORDER — PANTOPRAZOLE SODIUM 40 MG PO TBEC
40.0000 mg | DELAYED_RELEASE_TABLET | Freq: Two times a day (BID) | ORAL | Status: DC
  Administered 2017-05-09: 40 mg via ORAL
  Filled 2017-05-08: qty 1

## 2017-05-08 MED ORDER — SENNOSIDES-DOCUSATE SODIUM 8.6-50 MG PO TABS
1.0000 | ORAL_TABLET | Freq: Two times a day (BID) | ORAL | Status: DC
  Administered 2017-05-08 (×2): 1 via ORAL
  Filled 2017-05-08 (×3): qty 1

## 2017-05-08 MED ORDER — GI COCKTAIL ~~LOC~~
30.0000 mL | Freq: Three times a day (TID) | ORAL | Status: DC | PRN
  Filled 2017-05-08: qty 30

## 2017-05-08 MED ORDER — MAGNESIUM HYDROXIDE 400 MG/5ML PO SUSP
30.0000 mL | Freq: Every day | ORAL | Status: DC
  Administered 2017-05-08: 30 mL via ORAL
  Filled 2017-05-08 (×2): qty 30

## 2017-05-08 MED ORDER — METHOCARBAMOL 500 MG PO TABS: 500.0000 mg | ORAL_TABLET | Freq: Three times a day (TID) | ORAL | Status: DC | PRN

## 2017-05-08 MED ORDER — POLYETHYLENE GLYCOL 3350 17 G PO PACK
17.0000 g | PACK | Freq: Every day | ORAL | Status: DC
  Administered 2017-05-08: 17 g via ORAL
  Filled 2017-05-08 (×2): qty 1

## 2017-05-08 NOTE — Progress Notes (Addendum)
Pt daughter concerned pt is difficult to arouse this morning.  States this is not her usual behavior.  Washed patient's face and sat her up in bed.  She will answer questions but falls back to sleep.  She yelled out in pain to her right abdomen when staff moved her in the bed; but will say she hurts everywhere.  VS 141/77; 63; 95%RA; 97.9.  Dr. Posey Pronto notified.

## 2017-05-08 NOTE — Progress Notes (Signed)
Physical Therapy Treatment Patient Details Name: Wendy Krueger MRN: [id number] DOB: 08/08/1929 Today's Date: 05/08/2017    History of Present Illness Wendy Krueger is a 83 y.o. female with hx of dementia, htn, CVA, presents to ED with complaint of a unwitnessed fall and near sycopal episode.  Patient normally stays at home by herself during the day. At around 7 PM patient's daughter who lives with the patient returned back home and found the patient had multiple bruises on her body including the scalp and extremities and upper back and not ambulating well.  Pt is a poor historian and unable to say what happened. Pt also experienced episode of diaphoresis, pallor, eyes rolling back and incoherence for approximately 30 seconds to a minute and then a short period of confusion and then return to baseline dementia. MRI revealed no acute intracranial abnormality,  Xrays clear of acute fx. Events thought to be of a cardiogenic nature.     PT Comments    Pt is agreeable to therapy today, stating many times how glad she was to get up and walk, Pt is progressing towards her goals. Per her daughter she is back at her baseline level of function and dementia. Pt is currently minA for bed mobility, supervision for transfers and hands-on min guard for ambulation of 100 feet with RW. Pt requires skilled PT in the acute setting to progress mobility and improve strength and endurance to safely navigate their discharge environment.     Follow Up Recommendations  No PT follow up;Supervision/Assistance - 24 hour     Equipment Recommendations  Rolling walker with 5" wheels(youth walker)       Precautions / Restrictions Precautions Precautions: Fall Restrictions Weight Bearing Restrictions: No    Mobility  Bed Mobility Overal bed mobility: Needs Assistance Bed Mobility: Supine to Sit     Supine to sit: HOB elevated;Min assist     General bed mobility comments: minA for bringing trunk to upright,  pt able to scoot to EoB  Transfers Overall transfer level: Needs assistance Equipment used: Rolling walker (2 wheeled) Transfers: Sit to/from Stand Sit to Stand: Min guard         General transfer comment: hands on min guard for safety, good power up and steadying with RW  Ambulation/Gait Ambulation/Gait assistance: Min guard Ambulation Distance (Feet): 100 Feet Assistive device: Rolling walker (2 wheeled) Gait Pattern/deviations: Step-through pattern;Decreased stride length;Shuffle Gait velocity: slowed Gait velocity interpretation: Below normal speed for age/gender General Gait Details: hands on min guard for safety, pt with better ability to stay inside RW today    Modified Rankin (Stroke Patients Only) Modified Rankin (Stroke Patients Only) Pre-Morbid Rankin Score: Moderately severe disability Modified Rankin: Moderately severe disability     Balance Overall balance assessment: Needs assistance Sitting-balance support: Feet supported;No upper extremity supported Sitting balance-Leahy Scale: Fair     Standing balance support: Bilateral upper extremity supported Standing balance-Leahy Scale: Fair Standing balance comment: requires RW support                            Cognition Arousal/Alertness: Awake/alert Behavior During Therapy: Restless Overall Cognitive Status: History of cognitive impairments - at baseline                                 General Comments: Pt oriented to self, poor safety awareness, follows one step commands consistently  General Comments General comments (skin integrity, edema, etc.): Pts daughter is present and says her mother is back to baseline mobility and dementia level..      Pertinent Vitals/Pain Pain Assessment: No/denies pain           PT Goals (current goals can now be found in the care plan section) Acute Rehab PT Goals Patient Stated Goal: did not state  PT Goal Formulation: With  patient/family Time For Goal Achievement: 05/21/17 Potential to Achieve Goals: Fair Progress towards PT goals: Progressing toward goals    Frequency    Min 3X/week      PT Plan Current plan remains appropriate       AM-PAC PT "6 Clicks" Daily Activity  Outcome Measure  Difficulty turning over in bed (including adjusting bedclothes, sheets and blankets)?: A Little Difficulty moving from lying on back to sitting on the side of the bed? : Unable Difficulty sitting down on and standing up from a chair with arms (e.g., wheelchair, bedside commode, etc,.)?: A Little Help needed moving to and from a bed to chair (including a wheelchair)?: A Little Help needed walking in hospital room?: A Little Help needed climbing 3-5 steps with a railing? : A Lot 6 Click Score: 15    End of Session Equipment Utilized During Treatment: Gait belt Activity Tolerance: Patient tolerated treatment well Patient left: in chair;with call bell/phone within reach;with chair alarm set;with family/visitor present Nurse Communication: Mobility status PT Visit Diagnosis: Unsteadiness on feet (R26.81);Other abnormalities of gait and mobility (R26.89);Repeated falls (R29.6);Muscle weakness (generalized) (M62.81);History of falling (Z91.81);Difficulty in walking, not elsewhere classified (R26.2);Other symptoms and signs involving the nervous system (R29.898)     Time: 0626-9485 PT Time Calculation (min) (ACUTE ONLY): 15 min  Charges:  $Gait Training: 8-22 mins                    G Codes:  Functional Assessment Tool Used: AM-PAC 6 Clicks Basic Mobility Functional Limitation: Mobility: Walking and moving around Mobility: Walking and Moving Around Current Status (I6270): At least 40 percent but less than 60 percent impaired, limited or restricted Mobility: Walking and Moving Around Goal Status [phone]): At least 1 percent but less than 20 percent impaired, limited or restricted    Benjamine Mola B. Migdalia Dk PT,  DPT Acute Rehabilitation  [phone] Pager [phone]     [place] 05/08/2017, 5:47 PM

## 2017-05-08 NOTE — Progress Notes (Signed)
[facility] Progress Note  Patient: Wendy Krueger WCH:[id number]   PCP: Abner Greenspan, MD DOB: Oct 25, 1929   DOA: 05/06/2017   DOS: 05/08/2017   Date of Service: the patient was seen and examined on 05/08/2017  Subjective: Patient was lethargic and tired this morning.  Unable to answer orientation question.  Complain about pain all over but primarily focused more on the stomach.  Poor p.o. intake this morning.  No BM since last 3 days.  Brief hospital course: Pt. with PMH of dementia, HTN, HLD; admitted on 05/06/2017, presented with complaint of multiple bruises likely from fall, was found to have suspected seizures as well as severe constipation and multiple bruises.  Extensive evaluation done in the ER with imaging showed no evidence of acute fracture.  MRI brain performed to rule out stroke which was negative for stranding acute stroke.  Patient was started on empiric Keppra but developed acute encephalopathy Currently further plan is monitor overnight for resolution of encephalopathy as well as for reoccurrence of the symptoms without seizure medication..  Assessment and Plan: 1.  Suspected seizures. Acute encephalopathy. Progressive dementia. Presented with confusion and fall.  Event appeared to be seizure-like had an extensive evaluation done as an outpatient along with a prolonged EEG as well. EEG this admission does not show any evidence of active seizure focus but shows concern for metabolic encephalopathy. Patient was started on Keppra with concern for seizures.  Developed furthering worsening of lethargy and disorientation. At present we will monitor the patient overnight after discontinuation of the Keppra-after discussing with neurology-to ensure resolution of encephalopathy induced by Keppra as well as no seizure-like event.  2.  Recurrent fall. Patient does have prior history of fall, this time presented with multiple bruises on her body. On further evaluation this bruises  appears to be at various stages of healing. Concerned that the patient is having unwitnessed seizure events at home. Patient was started on Keppra but could not tolerate and has caused lethargy right now. There is also a concern for cardiac arrhythmia is causing patient's recurrent fall as patient did have some elevated troponin on admission although echocardiogram shows preserved EF. 30-day Holter monitor arranged for outpatient purposes.  3.  Abdominal pain. Severe constipation. Patient does appear to have moderate severe constipation on x-ray.  Poor appetite.  No nausea but did have multiple episodes of vomiting on day of admission. Aggressive bowel regimen initiated monitor. May require enema. Ultrasound abdomen shows gallstones without any acute abnormality.  4.  Essential hypertension. Continue lisinopril and metoprolol, holding hydrochlorothiazide. Reducing the dose of metoprolol from 50 mg twice daily to 27.5 mg twice daily given the bradycardia. Monitor bradycardia overnight as well as that can also lead to recurrent fall.  5.  Chronic kidney disease stage II. Renal function stable, continue to monitor.  6 history of dementia. Continue Aricept and Namenda..  7.  Right ICA terminus aneurysm. Stable to minimally increased from 2017-as per discussion with neurology about this. Was recommended to go for cerebral angiogram as well as possible coiling all the family refused given patient's dementia and poor prognosis.  Diet: Cardiac diet DVT Prophylaxis: subcutaneous Heparin  Advance goals of care discussion: full code  Family Communication: family was present at bedside, at the time of interview. The pt provided permission to discuss medical plan with the family. Opportunity was given to ask question and all questions were answered satisfactorily.   Disposition:  Discharge to SNF.  Consultants: neurology Procedures: EEg  Antibiotics: Anti-infectives (  From admission,  onward)   None       Objective: Physical Exam: Vitals:   05/08/17 0012 05/08/17 0442 05/08/17 0830 05/08/17 1413  BP: (!) 150/68 (!) 160/83 (!) 141/77 121/61  Pulse: (!) 59 70 61 (!) 57  Resp: 18 18  20   Temp: 99.1 F (37.3 C) 97.8 F (36.6 C) 97.9 F (36.6 C) 97.8 F (36.6 C)  TempSrc: Oral Oral Oral Oral  SpO2: 98% 96%  98%  Weight:      Height:       No intake or output data in the 24 hours ending 05/08/17 1633 Filed Weights   05/06/17 2100  Weight: 74.4 kg (164 lb)   General: Alert, Awake and Oriented to Person only. Appear in moderate distress, affect flat Eyes: PERRL, Conjunctiva normal ENT: Oral Mucosa clear moist. Neck: difficult to assess JVD, no Abnormal Mass Or lumps Cardiovascular: S1 and S2 Present, no Murmur, Peripheral Pulses Present Respiratory: increased respiratory effort, Bilateral Air entry equal and Decreased, no use of accessory muscle, no Crackles, no wheezes Abdomen: Bowel Sound present, Soft and mild epigastric tenderness, no hernia Skin: no redness, no Rash, no induration Extremities: no Pedal edema, no calf tenderness Neurologic: Grossly no focal neuro deficit. Bilaterally Equal motor strength  Data Reviewed: CBC: Recent Labs  Lab 05/06/17 2236 05/07/17 0415 05/08/17 0753  WBC 10.0 6.2 4.6  NEUTROABS 8.3*  --  2.6  HGB 13.6 11.4* 11.1*  HCT 40.9 35.6* 34.2*  MCV 93.4 94.2 95.3  PLT 214 151 884*   Basic Metabolic Panel: Recent Labs  Lab 05/06/17 2236 05/07/17 0415 05/08/17 0753  NA 140 141 141  K 3.6 3.3* 3.6  CL 107 108 112*  CO2 21* 22 23  GLUCOSE 144* 111* 90  BUN 16 18 15   CREATININE 1.24* 1.10* 1.03*  CALCIUM 10.2 9.3 8.8*  MG  --   --  1.7    Liver Function Tests: Recent Labs  Lab 05/08/17 0753  AST 24  ALT 13*  ALKPHOS 55  BILITOT 0.8  PROT 5.8*  ALBUMIN 3.2*   Recent Labs  Lab 05/08/17 0912  LIPASE 15   No results for input(s): AMMONIA in the last 168 hours. Coagulation Profile: No results for  input(s): INR, PROTIME in the last 168 hours. Cardiac Enzymes: Recent Labs  Lab 05/06/17 2236 05/07/17 0415  CKTOTAL  --  77  TROPONINI 0.10* 0.06*   BNP (last 3 results) No results for input(s): PROBNP in the last 8760 hours. CBG: No results for input(s): GLUCAP in the last 168 hours. Studies: Dg Abd Portable 1v  Result Date: 05/08/2017 CLINICAL DATA:  Abdominal pain EXAM: PORTABLE ABDOMEN - 1 VIEW COMPARISON:  CT abdomen and pelvis Nov 07, 2011 FINDINGS: There is moderate stool throughout the colon. There is no bowel dilatation or air-fluid level to suggest bowel obstruction. No free air. There are small phleboliths in the pelvis. There is calcification in the splenic artery. IMPRESSION: Moderate stool in colon.  No evident bowel obstruction or free air. Electronically Signed   By: Lowella Grip III M.D.   On: 05/08/2017 09:45    Scheduled Meds: . clopidogrel  75 mg Oral Daily  . donepezil  10 mg Oral QHS  . lisinopril  20 mg Oral Daily  . magnesium hydroxide  30 mL Oral Daily  . memantine  10 mg Oral BID  . metoprolol tartrate  37.5 mg Oral BID  . polyethylene glycol  17 g Oral Daily  .  senna-docusate  1 tablet Oral BID  . vitamin B-12  1,000 mcg Oral Daily   Continuous Infusions: PRN Meds: acetaminophen **OR** acetaminophen, methocarbamol, ondansetron **OR** ondansetron (ZOFRAN) IV  Time spent: 35 minutes  Author: Berle Mull, MD [facility] Pager: [phone] 05/08/2017 4:33 PM  If 7PM-7AM, please contact night-coverage at [email or web address], password [facility]

## 2017-05-08 NOTE — Progress Notes (Signed)
Per family, patient is back to baseline.  She is alert and oriented to self only.  Pleasant.  Ambulated to bathroom with assistance and walker.

## 2017-05-09 LAB — BASIC METABOLIC PANEL
Anion gap: 5 (ref 5–15)
BUN: 15 mg/dL (ref 6–20)
CHLORIDE: 109 mmol/L (ref 101–111)
CO2: 26 mmol/L (ref 22–32)
CREATININE: 1.11 mg/dL — AB (ref 0.44–1.00)
Calcium: 9 mg/dL (ref 8.9–10.3)
GFR, EST AFRICAN AMERICAN: 51 mL/min — AB (ref >60)
GFR, EST NON AFRICAN AMERICAN: 44 mL/min — AB (ref >60)
Glucose, Bld: 95 mg/dL (ref 65–99)
POTASSIUM: 3.9 mmol/L (ref 3.5–5.1)
SODIUM: 140 mmol/L (ref 135–145)

## 2017-05-09 LAB — CBC
HCT: 35.7 % — ABNORMAL LOW (ref 36.0–46.0)
HEMOGLOBIN: 11.1 g/dL — AB (ref 12.0–15.0)
MCH: 29.5 pg (ref 26.0–34.0)
MCHC: 31.1 g/dL (ref 30.0–36.0)
MCV: 94.9 fL (ref 78.0–100.0)
PLATELETS: 142 10*3/uL — AB (ref 150–400)
RBC: 3.76 MIL/uL — AB (ref 3.87–5.11)
RDW: 13.1 % (ref 11.5–15.5)
WBC: 4.7 10*3/uL (ref 4.0–10.5)

## 2017-05-09 LAB — CK: CK TOTAL: 90 U/L (ref 38–234)

## 2017-05-09 LAB — MAGNESIUM: MAGNESIUM: 2.1 mg/dL (ref 1.7–2.4)

## 2017-05-09 MED ORDER — SENNOSIDES-DOCUSATE SODIUM 8.6-50 MG PO TABS: 1.0000 | ORAL_TABLET | Freq: Every evening | ORAL | 0 refills | Status: DC | PRN

## 2017-05-09 MED ORDER — POLYETHYLENE GLYCOL 3350 17 G PO PACK: 17.0000 g | PACK | Freq: Every day | ORAL | 0 refills | Status: DC

## 2017-05-09 MED ORDER — HYDROCHLOROTHIAZIDE 25 MG PO TABS
25.0000 mg | ORAL_TABLET | Freq: Every day | ORAL | Status: DC
  Administered 2017-05-09: 25 mg via ORAL
  Filled 2017-05-09: qty 1

## 2017-05-09 MED ORDER — METHOCARBAMOL 500 MG PO TABS: 500.0000 mg | ORAL_TABLET | Freq: Three times a day (TID) | ORAL | 0 refills | Status: DC | PRN

## 2017-05-09 MED ORDER — METOPROLOL TARTRATE 37.5 MG PO TABS
37.5000 mg | ORAL_TABLET | Freq: Two times a day (BID) | ORAL | 0 refills | Status: DC
Start: 2017-05-09 — End: 2017-05-16

## 2017-05-09 NOTE — Progress Notes (Signed)
Physical Therapy Treatment Patient Details Name: Wendy Krueger MRN: [id number] DOB: 1930-02-11 Today's Date: 05/09/2017    History of Present Illness Wendy Krueger is a 83 y.o. female with hx of dementia, htn, CVA, presents to ED with complaint of a unwitnessed fall and near sycopal episode.  Patient normally stays at home by herself during the day. At around 7 PM patient's daughter who lives with the patient returned back home and found the patient had multiple bruises on her body including the scalp and extremities and upper back and not ambulating well.  Pt is a poor historian and unable to say what happened. Pt also experienced episode of diaphoresis, pallor, eyes rolling back and incoherence for approximately 30 seconds to a minute and then a short period of confusion and then return to baseline dementia. MRI revealed no acute intracranial abnormality,  Xrays clear of acute fx. Events thought to be of a cardiogenic nature.     PT Comments    Today's skilled session focused on gait training with RW and stairs. Pt required min guard to negotiate 3 steps with handrails on both sides to simulate home environment. Pt would benefit from continued skilled PT to achieve unmet goals and increase functional independence. Will continue to follow acutely.    Follow Up Recommendations  No PT follow up;Supervision/Assistance - 24 hour     Equipment Recommendations  Rolling walker with 5" wheels(youth walker)    Recommendations for Other Services       Precautions / Restrictions Precautions Precautions: Fall Restrictions Weight Bearing Restrictions: No    Mobility  Bed Mobility               General bed mobility comments: in chair on arrival  Transfers Overall transfer level: Needs assistance Equipment used: Rolling walker (2 wheeled) Transfers: Sit to/from Stand Sit to Stand: Min guard         General transfer comment: hands on min guard for safety, good power up and  steadying with RW  Ambulation/Gait Ambulation/Gait assistance: Min guard Ambulation Distance (Feet): 200 Feet Assistive device: Rolling walker (2 wheeled) Gait Pattern/deviations: Step-through pattern;Decreased stride length;Shuffle;Trunk flexed Gait velocity: slowed Gait velocity interpretation: Below normal speed for age/gender General Gait Details: Min guard for safety and cueing for walker proximity   Stairs Stairs: Yes   Stair Management: Two rails;Step to pattern;Forwards Number of Stairs: 3 General stair comments: pt able to negotiate steps with min guard for safety and two handrails.  Wheelchair Mobility    Modified Rankin (Stroke Patients Only) Modified Rankin (Stroke Patients Only) Pre-Morbid Rankin Score: Moderately severe disability Modified Rankin: Moderately severe disability     Balance Overall balance assessment: Needs assistance Sitting-balance support: Feet supported;No upper extremity supported Sitting balance-Leahy Scale: Fair     Standing balance support: Bilateral upper extremity supported Standing balance-Leahy Scale: Fair Standing balance comment: requires RW support                            Cognition Arousal/Alertness: Awake/alert Behavior During Therapy: WFL for tasks assessed/performed Overall Cognitive Status: History of cognitive impairments - at baseline                                 General Comments: Pt oriented to self, poor safety awareness, follows one step commands consistently      Exercises      General Comments  General comments (skin integrity, edema, etc.): daughter present for session      Pertinent Vitals/Pain Pain Assessment: Faces Faces Pain Scale: Hurts a little bit Pain Location: all over Pain Descriptors / Indicators: Aching Pain Intervention(s): Monitored during session    Home Living                      Prior Function            PT Goals (current goals can now be  found in the care plan section) Acute Rehab PT Goals Patient Stated Goal: did not state  PT Goal Formulation: With patient/family Time For Goal Achievement: 05/21/17 Potential to Achieve Goals: Fair Progress towards PT goals: Progressing toward goals    Frequency    Min 3X/week      PT Plan Current plan remains appropriate    Co-evaluation              AM-PAC PT "6 Clicks" Daily Activity  Outcome Measure  Difficulty turning over in bed (including adjusting bedclothes, sheets and blankets)?: A Little Difficulty moving from lying on back to sitting on the side of the bed? : Unable Difficulty sitting down on and standing up from a chair with arms (e.g., wheelchair, bedside commode, etc,.)?: Unable Help needed moving to and from a bed to chair (including a wheelchair)?: A Little Help needed walking in hospital room?: A Little Help needed climbing 3-5 steps with a railing? : A Little 6 Click Score: 14    End of Session Equipment Utilized During Treatment: Gait belt Activity Tolerance: Patient tolerated treatment well Patient left: in chair;with call bell/phone within reach;with family/visitor present Nurse Communication: Mobility status PT Visit Diagnosis: Unsteadiness on feet (R26.81);Other abnormalities of gait and mobility (R26.89);Repeated falls (R29.6);Muscle weakness (generalized) (M62.81);History of falling (Z91.81);Difficulty in walking, not elsewhere classified (R26.2);Other symptoms and signs involving the nervous system (R29.898)     Time: 8338-2505 PT Time Calculation (min) (ACUTE ONLY): 18 min  Charges:  $Gait Training: 8-22 mins                    G Codes:       Benjiman Core, [place] Pager [phone] Acute Rehab    Allena Katz 05/09/2017, 11:20 AM

## 2017-05-09 NOTE — Progress Notes (Signed)
Discharge orders received.  Discharge instructions and follow-up appointments reviewed with the patient and her daughter.  VSS upon discharge.  IV removed and education complete.  All belongings sent with the patient.  Transported out via wheelchair.   Cori Razor, RN

## 2017-05-09 NOTE — Consult Note (Signed)
[facility] CM Primary Care Navigator  05/09/2017  Wendy Krueger September 15, 1929 [id number]  Met with patient and [name]) at the bedside to identify possible discharge needs. Daughter reports that patient had an unwitnessed fall and near syncopal episode that led to this admission.  Patient's daughter endorses Dr. [facility]  as the primary care provider.    Patient shared using [facility] [address] to obtain medications without any problem.   Patient's other [name]) manages medications at home using "pill box" system filled weekly.   [name]) provides transportation to her doctors' appointments.  Patient lives with her daughterHorris Krueger who is the primary caregiver at home and 2 grandsons Marjory Lies and Naalehu). Her other children will also take turns in taking care of patient.  Anticipated discharge plan is home per daughter.  Patient and daughter voiced understanding to call primary care provider's office when she returns home for a post discharge follow-up appointment within a week or sooner if needs arise. Patient letter (with PCP's contact number) was provided as a reminder.  Explained to patient and daughter about [facility] CM services available for healthmanagement at home but denied any current needs or concerns for now.Encouraged patient's daughter to seekreferral from primary care provider to [facility] care management if deemed necessary andappropriate for services in the future.  [facility] care management information provided for future needs that she may have.   Patient/ daughter had opted and verbally agreed for EMMI calls to follow-up with patient'srecovery at home.   Referral made to [place] calls after discharge.    For additional questions please contact:  Edwena Felty A. Edan Serratore, BSN, RN-BC [facility] PRIMARY CARE Navigator Cell: [phone]

## 2017-05-09 NOTE — Care Management Note (Addendum)
Case Management Note  Patient Details  Name: Wendy Krueger MRN: [id number] Date of Birth: [birth year]  Subjective/Objective:                    Action/Plan: Pt discharging home with orders for [facility] PT. CM provided the patients daughter a choice of [place] agencies and she selected [facility]. Jermaine with [facility] notified and accepted the referral. Pt lives with daughter who will provide transportation home today.  Pt with orders for a rolling walker. Pt and daughter state she has a walker at home.    Expected Discharge Date:  05/09/17               Expected Discharge Plan:  Home/Self Care  In-House Referral:     Discharge planning Services  CM Consult  Post Acute Care Choice:  Durable Medical Equipment(pt has walker at home) Choice offered to:     DME Arranged:    DME Agency:     HH Arranged:    [place] Agency:     Status of Service:  Completed, signed off  If discussed at [employer] of Stay Meetings, dates discussed:    Additional Comments:  Pollie Friar, RN 05/09/2017, 12:53 PM

## 2017-05-12 ENCOUNTER — Telehealth: Payer: Self-pay | Admitting: *Deleted

## 2017-05-12 NOTE — Telephone Encounter (Signed)
Transition Care Management Follow-up Telephone Call   TCM completed by pts daughter [name]  Date discharged? 05/09/2017   How have you been since you were released from the hospital? "sore"   Do you understand why you were in the hospital? yes   Do you understand the discharge instructions? yes   Where were you discharged to? home   Items Reviewed:  Medications reviewed: yes  Allergies reviewed: yes  Dietary changes reviewed: yes  Referrals reviewed: yes   Functional Questionnaire:   Activities of Daily Living (ADLs):    States they require assistance with the following: in most areas but has assistance of daughter   Any transportation issues/concerns?: no   Any patient concerns? no   Confirmed importance and date/time of follow-up visits scheduled yes  Provider Appointment booked with [facility] 05/16/2017  Confirmed with patient if condition begins to worsen call PCP or go to the ER.  Patient was given the office number and encouraged to call back with question or concerns.  : yes

## 2017-05-12 NOTE — Discharge Summary (Signed)
[facility] Discharge Summary   Patient: Wendy Krueger ZLD:[id number]   PCP: Abner Greenspan, MD DOB: [birth year]   Date of admission: 05/06/2017   Date of discharge: 05/09/2017   Discharge Diagnoses:  Principal Problem:   Near syncope Active Problems:   Dementia   Fall   Contusion of scalp   Contusion of sacrum   Contusion of right side of back   Acute encephalopathy   Admitted From: home Disposition:  Home with home health  Recommendations for Outpatient Follow-up:  1. Follow-up with PCP in 1-2 weeks 2. Continue follow-up with neurology as prior scheduled  Follow-up Information    Tower, Wynelle Fanny, MD. Schedule an appointment as soon as possible for a visit in 1 week(s).   Specialties:  Family Medicine, Radiology Contact information: [address] [place] [address] [phone]          Diet recommendation: Cardiac diet  Activity: The patient is advised to gradually reintroduce usual activities.  Discharge Condition: good  Code Status: Full code  History of present illness: As per the H and P dictated on admission, "Wendy Krueger is a 83 y.o. female with history of dementia, hypertension, hyperlipidemia was brought to the ER after patient's daughter found that patient had multiple bruises on returning back home last evening.  At around 7 PM patient's daughter who lives with the patient returned back home and found the patient had multiple bruises on her body including the scalp and extremities and upper back.  Since patient has dementia it is not clear where she fell.  Patient otherwise did not complain of any chest pain shortness of breath nausea vomiting diarrhea abdominal pain did not have any fever or chills or any change in her medications recently.  ED Course: In the ER patient had CT of the head and neck followed by x-rays of the spine chest x-ray which all did not show anything acute.  While waiting in the ER patient's daughter noticed  that patient had an episode of flushing diaphoresis and nausea.  This lasted for around 20 minutes.  Resolved without any intervention.  Patient admitted for further observation for possible near syncope versus syncope  "  Hospital Course:  Summary of her active problems in the hospital is as following. 1.  Suspected seizures. Acute encephalopathy. Progressive dementia. Presented with confusion and fall.  Event appeared to be seizure-like had an extensive evaluation done as an outpatient along with a prolonged EEG as well. EEG this admission does not show any evidence of active seizure focus but shows concern for metabolic encephalopathy. Patient was started on Keppra with concern for seizures.  Developed furthering worsening of lethargy and disorientation. After discussing with neurology, Keppra was stopped with resolution of encephalopathy induced by Keppra as well as no seizure-like event.  2.  Recurrent fall. Patient does have prior history of fall, this time presented with multiple bruises on her body. On further evaluation this bruises appears to be at various stages of healing. Concerned that the patient is having unwitnessed seizure events at home. Patient was started on Keppra but could not tolerate and has caused lethargy right now. There is also a concern for cardiac arrhythmia is causing patient's recurrent fall as patient did have some elevated troponin on admission although echocardiogram shows preserved EF. 30-day Holter monitor arranged for outpatient purposes.  3.  Abdominal pain. Severe constipation. Patient does appear to have moderate severe constipation on x-ray.  Poor appetite.  No nausea  but did have multiple episodes of vomiting on day of admission. Aggressive bowel regimen initiated  Ultrasound abdomen shows gallstones without any acute abnormality.  4.  Essential hypertension. Continue lisinopril and metoprolol, and hydrochlorothiazide. Reducing the dose of  metoprolol from 50 mg twice daily to 37.5 mg twice daily given the bradycardia.  5.  Chronic kidney disease stage II. Renal function stable, continue to monitor.  6 history of dementia. Continue Aricept and Namenda..  7.  Right ICA terminus aneurysm. Stable to minimally increased from 2017 as per discussion with neurology about this, pt Was recommended to go for cerebral angiogram as well as possible coiling and the family refused given patient's dementia and poor prognosis.  All other chronic medical condition were stable during the hospitalization.  Patient was seen by physical therapy, who recommended home health which was arranged by Education officer, museum and case Freight forwarder. On the day of the discharge the patient's vitals were stable, and no other acute medical condition were reported by patient. the patient was felt safe to be discharge at home  with home health.  Procedures and Results:  EEG  Echocardiogram   Consultations:  Neurology   DISCHARGE MEDICATION: Discharge Medication List as of 05/09/2017  4:02 PM    START taking these medications   Details  methocarbamol (ROBAXIN) 500 MG tablet Take 1 tablet (500 mg total) every 8 (eight) hours as needed by mouth for muscle spasms., Starting Fri 05/09/2017, Normal    polyethylene glycol (MIRALAX / GLYCOLAX) packet Take 17 g daily by mouth., Starting Sat 05/10/2017, Normal    senna-docusate (SENOKOT-S) 8.6-50 MG tablet Take 1 tablet at bedtime as needed by mouth for mild constipation., Starting Fri 05/09/2017, Normal      CONTINUE these medications which have CHANGED   Details  metoprolol tartrate 37.5 MG TABS Take 37.5 mg 2 (two) times daily by mouth., Starting Fri 05/09/2017, Normal      CONTINUE these medications which have NOT CHANGED   Details  Cholecalciferol (VITAMIN D-3) 1000 units CAPS Take 1 capsule See admin instructions by mouth. Take 1000 units in the morning and 2000 in the evening, Historical Med    clopidogrel  (PLAVIX) 75 MG tablet TAKE 1 TABLET BY MOUTH EVERY DAY, Normal    donepezil (ARICEPT) 10 MG tablet Take 1 tablet (10 mg total) by mouth at bedtime., Starting Thu 06/06/2016, Normal    Fexofenadine HCl (ALLERGY 24-HR PO) Take 1 capsule by mouth daily., Historical Med    hydrochlorothiazide (HYDRODIURIL) 25 MG tablet Take 1 tablet (25 mg total) by mouth daily., Starting Wed 04/16/2017, Normal    lisinopril (PRINIVIL,ZESTRIL) 20 MG tablet Take 1 tablet (20 mg total) by mouth daily., Starting Wed 04/16/2017, Normal    memantine (NAMENDA) 10 MG tablet Take 1 tablet (10 mg total) by mouth 2 (two) times daily., Starting Thu 06/06/2016, Normal    Multiple Vitamins-Minerals ([employer] SPECTRAVITE PO) Take 1 capsule by mouth daily., Historical Med    potassium chloride (KLOR-CON M10) 10 MEQ tablet Take 2 tablets (20 mEq total) by mouth daily., Starting Wed 04/16/2017, Normal    vitamin B-12 (CYANOCOBALAMIN) 1000 MCG tablet Take 1,000 mcg by mouth daily., Historical Med       Allergies  Allergen Reactions  . Alendronate Sodium     Leg pain   . Amlodipine Besylate Hives  . Calcitonin ([place]) Other (See Comments)     headache/ head pressure  . Simvastatin Other (See Comments)    Leg pain    Discharge  Instructions    Diet - low sodium heart healthy   Complete by:  As directed    Discharge instructions   Complete by:  As directed    It is important that you read following instructions as well as go over your medication list with RN to help you understand your care after this hospitalization.  Discharge Instructions: Please follow-up with PCP in one week  Please request your primary care physician to go over all Hospital Tests and Procedure/Radiological results at the follow up,  Please get all Hospital records sent to your PCP by signing hospital release before you go home.   Do not drive, operating heavy machinery, perform activities at heights, swimming or participation in water activities or  provide baby sitting services, until you have been seen by Primary Care Physician or a Neurologist and advised to do so again. Do not take more than prescribed Pain, Sleep and Anxiety Medications. You were cared for by a hospitalist during your hospital stay. If you have any questions about your discharge medications or the care you received while you were in the hospital after you are discharged, you can call the unit and ask to speak with the hospitalist on call if the hospitalist that took care of you is not available.  Once you are discharged, your primary care physician will handle any further medical issues. Please note that NO REFILLS for any discharge medications will be authorized once you are discharged, as it is imperative that you return to your primary care physician (or establish a relationship with a primary care physician if you do not have one) for your aftercare needs so that they can reassess your need for medications and monitor your lab values. You Must read complete instructions/literature along with all the possible adverse reactions/side effects for all the Medicines you take and that have been prescribed to you. Take any new Medicines after you have completely understood and accept all the possible adverse reactions/side effects.   Increase activity slowly   Complete by:  As directed      Discharge Exam: Filed Weights   05/06/17 2100  Weight: 74.4 kg (164 lb)   Vitals:   05/09/17 1027 05/09/17 1501  BP: 131/74 134/74  Pulse: 67 69  Resp: 19 20  Temp: 98.7 F (37.1 C) (!) 97.5 F (36.4 C)  SpO2: 98% 99%   General: Appear in no distress, no Rash; Oral Mucosa moist. Cardiovascular: S1 and S2 Present, no Murmur, no JVD Respiratory: Bilateral Air entry present and Clear to Auscultation, no Crackles, no wheezes Abdomen: Bowel Sound present, Soft and n tenderness Extremities: no Pedal edema, no calf tenderness Neurology: Grossly no focal neuro deficit.  The results  of significant diagnostics from this hospitalization (including imaging, microbiology, ancillary and laboratory) are listed below for reference.    Significant Diagnostic Studies: Dg Chest 2 View  Result Date: 05/06/2017 CLINICAL DATA:  Fall with back bruising EXAM: CHEST  2 VIEW COMPARISON:  01/05/2011 FINDINGS: Minimal scarring at the left base. No acute consolidation or effusion. Cardiomegaly. Aortic atherosclerosis. No pneumothorax. Chronic thoracic compression deformities. IMPRESSION: 1. Cardiomegaly without edema or infiltrate 2. Chronic compression deformities of the thoracic spine Electronically Signed   By: Donavan Foil M.D.   On: 05/06/2017 23:53   Dg Thoracic Spine 2 View  Result Date: 05/06/2017 CLINICAL DATA:  Fall with back bruising EXAM: THORACIC SPINE 2 VIEWS COMPARISON:  01/05/2011 FINDINGS: Eleven rib pairs. Chronic moderate severe compression fracture at T8 with mild  to moderate compression fracture at T7 also chronic. Chronic mild compression of T11. Mild degenerative changes. IMPRESSION: 1. No definite acute osseous abnormality 2. Multiple chronic compression fractures at T7, T8 and T11 Electronically Signed   By: Donavan Foil M.D.   On: 05/06/2017 23:49   Dg Lumbar Spine Complete  Result Date: 05/06/2017 CLINICAL DATA:  Fall with bruising EXAM: LUMBAR SPINE - COMPLETE 4+ VIEW COMPARISON:  CT 11/07/2011 FINDINGS: Trace anterolisthesis of L4 on L5. Moderate degenerative changes at L4-L5 and L5-S1. Mild chronic compression at T11. Aortic atherosclerosis. IMPRESSION: No definite acute osseous abnormality Electronically Signed   By: Donavan Foil M.D.   On: 05/06/2017 23:51   Dg Pelvis 1-2 Views  Result Date: 05/07/2017 CLINICAL DATA:  Status post fall yesterday without recollection of details. EXAM: PELVIS - 1-2 VIEW COMPARISON:  Sacrum and coccyx series of today's date and coronal and sagittal CT images through the pelvis from a scan of Nov 07, 2011 FINDINGS: The bony pelvis is  subjectively mildly osteopenic. No acute fracture is observed. The observed portions of the sacrum are normal. The pubic rami are intact. As best as can be determined the hips are intact. IMPRESSION: No acute bony abnormality of the pelvis is observed. Electronically Signed   By: David  Martinique M.D.   On: 05/07/2017 09:07   Dg Sacrum/coccyx  Result Date: 05/06/2017 CLINICAL DATA:  Fall with bruising EXAM: SACRUM AND COCCYX - 2+ VIEW COMPARISON:  11/07/2011 FINDINGS: SI joint arthritis. No widening. Pubic symphysis and rami appear intact. No definite acute displaced fracture. IMPRESSION: No definite acute osseous abnormality Electronically Signed   By: Donavan Foil M.D.   On: 05/06/2017 23:52   Dg Scapula Right  Result Date: 05/06/2017 CLINICAL DATA:  Bruising after fall. EXAM: RIGHT SCAPULA - 2+ VIEWS COMPARISON:  Chest CT 01/08/2011 FINDINGS: There is no evidence of fracture or other focal bone lesions. Osteoarthritis of the adjacent acromioclavicular joint. The adjacent ribs are intact. No pneumothorax or pulmonary consolidation. There is thoracic spondylosis with chronic T8 compression based on remote CT from 2012. IMPRESSION: No acute scapular fracture. 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 Electronically Signed   By: Ashley Royalty M.D.   On: 05/06/2017 23:45   Ct Head Wo Contrast  Result Date: 05/06/2017 CLINICAL DATA:  [place] at home scalp hematoma EXAM: CT HEAD WITHOUT CONTRAST CT CERVICAL SPINE WITHOUT CONTRAST TECHNIQUE: Multidetector CT imaging of the head and cervical spine was performed following the standard protocol without intravenous contrast. Multiplanar CT image reconstructions of the cervical spine were also generated. COMPARISON:  MRI 03/01/2016, CT brain 01/05/2011 FINDINGS: CT HEAD FINDINGS Brain: No acute territorial infarction, hemorrhage, or intracranial mass is visualized. Moderate atrophy. Mild small vessel ischemic  changes of the white matter. Stable ventricle size. Vascular: No hyperdense vessels. Vertebral artery and carotid artery calcifications. 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 Skull: No skull fracture. Sinuses/Orbits: No acute finding. Other: Large posterior scalp swelling. CT CERVICAL SPINE FINDINGS Alignment: Trace anterolisthesis of C7 on T1. Mild straightening. Facet alignment is within normal limits. Skull base and vertebrae: Craniovertebral junction is intact. There is no displaced fracture. Soft tissues and spinal canal: No prevertebral fluid or swelling. No visible canal hematoma. Disc levels: Moderate severe multilevel degenerative disc changes from C4 through C7. Multilevel bilateral facet arthropathy with right greater than left foraminal stenosis. Upper chest: Lung apices are clear. Calcified nodule left lobe of thyroid. 2.3  cm hypodense nodule in the region of right lobe of thyroid. Other: None IMPRESSION: 1. No CT evidence for acute intracranial abnormality. Large posterior scalp soft tissue swelling 2. Trace anterolisthesis of C7 on T1.  No fracture. 3. 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 4. Bilateral thyroid nodules. Nonemergent thyroid ultrasound correlation as indicated. Electronically Signed   By: Donavan Foil M.D.   On: 05/06/2017 23:10   Ct Cervical Spine Wo Contrast  Result Date: 05/06/2017 CLINICAL DATA:  [place] at home scalp hematoma EXAM: CT HEAD WITHOUT CONTRAST CT CERVICAL SPINE WITHOUT CONTRAST TECHNIQUE: Multidetector CT imaging of the head and cervical spine was performed following the standard protocol without intravenous contrast. Multiplanar CT image reconstructions of the cervical spine were also generated. COMPARISON:  MRI 03/01/2016, CT brain 01/05/2011 FINDINGS: CT HEAD FINDINGS Brain: No acute territorial infarction, hemorrhage, or intracranial  mass is visualized. Moderate atrophy. Mild small vessel ischemic changes of the white matter. Stable ventricle size. Vascular: No hyperdense vessels. Vertebral artery and carotid artery calcifications. 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 Skull: No skull fracture. Sinuses/Orbits: No acute finding. Other: Large posterior scalp swelling. CT CERVICAL SPINE FINDINGS Alignment: Trace anterolisthesis of C7 on T1. Mild straightening. Facet alignment is within normal limits. Skull base and vertebrae: Craniovertebral junction is intact. There is no displaced fracture. Soft tissues and spinal canal: No prevertebral fluid or swelling. No visible canal hematoma. Disc levels: Moderate severe multilevel degenerative disc changes from C4 through C7. Multilevel bilateral facet arthropathy with right greater than left foraminal stenosis. Upper chest: Lung apices are clear. Calcified nodule left lobe of thyroid. 2.3 cm hypodense nodule in the region of right lobe of thyroid. Other: None IMPRESSION: 1. No CT evidence for acute intracranial abnormality. Large posterior scalp soft tissue swelling 2. Trace anterolisthesis of C7 on T1.  No fracture. 3. 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 4. Bilateral thyroid nodules. Nonemergent thyroid ultrasound correlation as indicated. Electronically Signed   By: Donavan Foil M.D.   On: 05/06/2017 23:10   Mr Jodene Nam Neck Wo Contrast  Result Date: 05/07/2017 CLINICAL DATA:  Near syncope versus syncope. Fall with multiple bruises. EXAM: MR HEAD WITHOUT CONTRAST MRA OF THE NECK WITHOUT CONTRAST TECHNIQUE: Multiplanar, multiecho pulse sequences of the brain and surrounding structures were obtained without intravenous contrast. Angiographic images of the neck were obtained using MRA technique without intravenous contrast. COMPARISON:  Head CT 05/06/2017.  Head MRI/ MRA  03/01/2016. FINDINGS: MR HEAD FINDINGS Multiple sequences are mildly motion degraded. Brain: There is no evidence of acute infarct, intracranial hemorrhage, mass, midline shift, or extra-axial fluid collection. There is moderate cerebral atrophy. Patchy T2 hyperintensities in the cerebral white matter and pons are similar to the prior MRI and nonspecific but compatible with chronic small vessel ischemic disease, relatively mild for age. 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 Vascular: Major intracranial vascular flow voids are preserved. Fusiform aneurysmal dilatation of the right supraclinoid ICA to terminus appears stable to slightly greater than on the prior MRI. Skull and upper cervical spine: No suspicious marrow lesion. Sinuses/Orbits: Unremarkable orbits. Paranasal sinuses and mastoid air cells are clear. Other: Moderate posterior scalp swelling/hematoma. MRA NECK FINDINGS Image quality is degraded by motion artifact and noncontrast technique. The common carotid and cervical internal carotid arteries are patent bilaterally without  evidence of flow limiting stenosis. Limited intracranial imaging demonstrates fusiform aneurysmal dilatation of the right supraclinoid ICA to terminus measuring 8 mm, stable to minimally increased from the prior MRA. The vertebral arteries are patent with antegrade flow bilaterally. The proximal vertebral arteries are not well evaluated due to artifact, limiting evaluation for stenosis. The left V2 segment appears widely patent. Mild irregular narrowing versus artifact is present in the distal right V2 and proximal V3 segments. There is a suspected severe distal right V4 stenosis, and there may also be moderate to severe distal left V4 narrowing. IMPRESSION: 1. No acute intracranial abnormality. 2. Mild chronic small vessel ischemic disease. 3. Neck MRA limited by motion and noncontrast technique as detailed above. No flow limiting cervical carotid artery stenosis.  4. 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 5. Suspected moderate to severe distal bilateral vertebral artery stenoses. Electronically Signed   By: Logan Bores M.D.   On: 05/07/2017 10:20   Mr Brain Wo Contrast  Result Date: 05/07/2017 CLINICAL DATA:  Near syncope versus syncope. Fall with multiple bruises. EXAM: MR HEAD WITHOUT CONTRAST MRA OF THE NECK WITHOUT CONTRAST TECHNIQUE: Multiplanar, multiecho pulse sequences of the brain and surrounding structures were obtained without intravenous contrast. Angiographic images of the neck were obtained using MRA technique without intravenous contrast. COMPARISON:  Head CT 05/06/2017.  Head MRI/ MRA 03/01/2016. FINDINGS: MR HEAD FINDINGS Multiple sequences are mildly motion degraded. Brain: There is no evidence of acute infarct, intracranial hemorrhage, mass, midline shift, or extra-axial fluid collection. There is moderate cerebral atrophy. Patchy T2 hyperintensities in the cerebral white matter and pons are similar to the prior MRI and nonspecific but compatible with chronic small vessel ischemic disease, relatively mild for age. 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 Vascular: Major intracranial vascular flow voids are preserved. Fusiform aneurysmal dilatation of the right supraclinoid ICA to terminus appears stable to slightly greater than on the prior MRI. Skull and upper cervical spine: No suspicious marrow lesion. Sinuses/Orbits: Unremarkable orbits. Paranasal sinuses and mastoid air cells are clear. Other: Moderate posterior scalp swelling/hematoma. MRA NECK FINDINGS Image quality is degraded by motion artifact and noncontrast technique. The common carotid and cervical internal carotid arteries are patent bilaterally without evidence of flow limiting stenosis. Limited intracranial imaging demonstrates fusiform aneurysmal dilatation of the right supraclinoid ICA to terminus measuring 8 mm, stable to  minimally increased from the prior MRA. The vertebral arteries are patent with antegrade flow bilaterally. The proximal vertebral arteries are not well evaluated due to artifact, limiting evaluation for stenosis. The left V2 segment appears widely patent. Mild irregular narrowing versus artifact is present in the distal right V2 and proximal V3 segments. There is a suspected severe distal right V4 stenosis, and there may also be moderate to severe distal left V4 narrowing. IMPRESSION: 1. No acute intracranial abnormality. 2. Mild chronic small vessel ischemic disease. 3. Neck MRA limited by motion and noncontrast technique as detailed above. No flow limiting cervical carotid artery stenosis. 4. 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 5. Suspected moderate to severe distal bilateral vertebral artery stenoses. Electronically Signed   By: Logan Bores M.D.   On: 05/07/2017 10:20   US Abdomen Complete  Result Date: 05/07/2017 CLINICAL DATA:  Nausea since yesterday. EXAM: ABDOMEN ULTRASOUND COMPLETE COMPARISON:  Abdominal and pelvic CT scan of Nov 07, 2011 FINDINGS: The study is limited due to the patient's body habitus. Gallbladder:  The gallbladder is contracted. There is a stone present measuring 1.8 cm in diameter. There is no gallbladder wall thickening, pericholecystic fluid, or positive sonographic Murphy's sign. Common bile duct: Diameter: 4.6 mm Liver: The hepatic echotexture is normal. In the left lobe there is a 3.4 x 2.5 x 2.5 cm mixed echogenicity region which likely corresponds to a probable hemangioma on the May 2013 CT scan and also seen on the previous ultrasound. Portal vein is patent on color Doppler imaging with normal direction of blood flow towards the liver. IVC: No abnormality visualized. Pancreas: Visualized portion unremarkable. Spleen: Size and appearance within normal limits. Right Kidney: Length: 10.3 cm. The renal cortical echotexture is  increased and exceeds that of the liver. There is a cyst in the midpole of the right kidney measuring 5.1 x 4.0 x 4.0 cm. There is no hydronephrosis. Left Kidney: Length: 10.2 cm. The renal cortical echotexture is increased similar to that on the right. Abdominal aorta: No aneurysm is observed.  There is mural plaque. Other findings: No ascites is demonstrated. IMPRESSION: Gallstones within a contracted gallbladder. No sonographic evidence of acute cholecystitis. Probable hemangioma within the medial aspect of the left hepatic lobe not significantly changed since the 2013 CT scan and ultrasound. Increased renal cortical echotexture bilaterally consistent with medical renal disease. No hydronephrosis. Electronically Signed   By: David  Martinique M.D.   On: 05/07/2017 12:30   Dg Abd Portable 1v  Result Date: 05/08/2017 CLINICAL DATA:  Abdominal pain EXAM: PORTABLE ABDOMEN - 1 VIEW COMPARISON:  CT abdomen and pelvis Nov 07, 2011 FINDINGS: There is moderate stool throughout the colon. There is no bowel dilatation or air-fluid level to suggest bowel obstruction. No free air. There are small phleboliths in the pelvis. There is calcification in the splenic artery. IMPRESSION: Moderate stool in colon.  No evident bowel obstruction or free air. Electronically Signed   By: Lowella Grip III M.D.   On: 05/08/2017 09:45   Dg Foot 2 Views Left  Result Date: 05/07/2017 CLINICAL DATA:  Dorsal and lateral bruising after a fall yesterday. EXAM: LEFT FOOT - 2 VIEW COMPARISON:  Report of a foot series of September 14, 2001. FINDINGS: The bones are subjectively mildly osteopenic. The phalanges appear intact. The metatarsals are intact. The tarsal bones exhibit no acute abnormalities. There is a plantar calcaneal spur. There are mild degenerative changes of the lateral tarsometatarsal joints. The soft tissues exhibit no acute findings. Minimal swelling dorsally may be present. IMPRESSION: No acute bony abnormality of the left foot  is observed. Electronically Signed   By: David  Martinique M.D.   On: 05/07/2017 09:05    Microbiology: No results found for this or any previous visit (from the past 240 hour(s)).   Labs: CBC: Recent Labs  Lab 05/06/17 2236 05/07/17 0415 05/08/17 0753 05/09/17 0352  WBC 10.0 6.2 4.6 4.7  NEUTROABS 8.3*  --  2.6  --   HGB 13.6 11.4* 11.1* 11.1*  HCT 40.9 35.6* 34.2* 35.7*  MCV 93.4 94.2 95.3 94.9  PLT 214 151 143* 798*   Basic Metabolic Panel: Recent Labs  Lab 05/06/17 2236 05/07/17 0415 05/08/17 0753 05/09/17 0352  NA 140 141 141 140  K 3.6 3.3* 3.6 3.9  CL 107 108 112* 109  CO2 21* 22 23 26   GLUCOSE 144* 111* 90 95  BUN 16 18 15 15   CREATININE 1.24* 1.10* 1.03* 1.11*  CALCIUM 10.2 9.3 8.8* 9.0  MG  --   --  1.7 2.1  Liver Function Tests: Recent Labs  Lab 05/08/17 0753  AST 24  ALT 13*  ALKPHOS 55  BILITOT 0.8  PROT 5.8*  ALBUMIN 3.2*   Recent Labs  Lab 05/08/17 0912  LIPASE 15   No results for input(s): AMMONIA in the last 168 hours. Cardiac Enzymes: Recent Labs  Lab 05/06/17 2236 05/07/17 0415 05/09/17 0352  CKTOTAL  --  77 90  TROPONINI 0.10* 0.06*  --    BNP (last 3 results) No results for input(s): BNP in the last 8760 hours. CBG: No results for input(s): GLUCAP in the last 168 hours. Time spent: 35 minutes  Signed:  Berle Mull  [facility] 05/09/2017  7:47 AM

## 2017-05-13 DIAGNOSIS — N182 Chronic kidney disease, stage 2 (mild): Secondary | ICD-10-CM | POA: Diagnosis not present

## 2017-05-13 DIAGNOSIS — I720 Aneurysm of carotid artery: Secondary | ICD-10-CM | POA: Diagnosis not present

## 2017-05-13 DIAGNOSIS — I129 Hypertensive chronic kidney disease with stage 1 through stage 4 chronic kidney disease, or unspecified chronic kidney disease: Secondary | ICD-10-CM | POA: Diagnosis not present

## 2017-05-13 DIAGNOSIS — G9340 Encephalopathy, unspecified: Secondary | ICD-10-CM | POA: Diagnosis not present

## 2017-05-13 DIAGNOSIS — Z9181 History of falling: Secondary | ICD-10-CM | POA: Diagnosis not present

## 2017-05-13 DIAGNOSIS — Z7902 Long term (current) use of antithrombotics/antiplatelets: Secondary | ICD-10-CM | POA: Diagnosis not present

## 2017-05-13 DIAGNOSIS — S300XXD Contusion of lower back and pelvis, subsequent encounter: Secondary | ICD-10-CM | POA: Diagnosis not present

## 2017-05-13 DIAGNOSIS — F0390 Unspecified dementia without behavioral disturbance: Secondary | ICD-10-CM | POA: Diagnosis not present

## 2017-05-13 DIAGNOSIS — R55 Syncope and collapse: Secondary | ICD-10-CM | POA: Diagnosis not present

## 2017-05-13 DIAGNOSIS — M810 Age-related osteoporosis without current pathological fracture: Secondary | ICD-10-CM | POA: Diagnosis not present

## 2017-05-13 DIAGNOSIS — S0003XD Contusion of scalp, subsequent encounter: Secondary | ICD-10-CM | POA: Diagnosis not present

## 2017-05-13 NOTE — ED Provider Notes (Signed)
[facility] PROGRESSIVE CARE Provider Note   CSN: [id number] Arrival date & time: 05/06/17  2056     History   Chief Complaint Chief Complaint  Patient presents with  . Fall    Head Injury/Plavix    HPI Wendy Krueger is a 83 y.o. female.  HPI Wendy Krueger is a 83 y.o. female with hx of dementia, htn, CVA, presents to ED with complaint of a fall.  Patient normally stays at home by herself during the afternoon.  Family came to her house and found her not ambulating well with new bruises.  Patient is a poor historian and unable to say what happened but does admit that she fell.  She is unable to say how she fell.  Patient is ambulatory however slower than usual and limping.  Patient is not complaining of any pain at this time.  Family found bruises to her head, back, bilateral arms and legs.  Patient normally does not use a walker inside the house but uses a walker if she goes outside.  She does have history of falls but family states that she does not fall frequently.  Past Medical History:  Diagnosis Date  . Asymptomatic gallstones   . Colitis - presumed infectious origin    one ER visit   . Dementia   . Hyperlipidemia   . Hypertension   . Osteoarthritis   . Osteoporosis     Patient Active Problem List   Diagnosis Date Noted  . Acute encephalopathy 05/08/2017  . Fall 05/07/2017  . Near syncope 05/07/2017  . Contusion of scalp 05/07/2017  . Contusion of sacrum 05/07/2017  . Contusion of right side of back 05/07/2017  . Cerebellar stroke ([place]) 03/10/2016  . Carotid aneurysm, right ([place]) 02/21/2016  . Lipoma of shoulder 03/27/2015  . History of closed Colles' fracture 09/06/2014  . Allergic rhinitis 06/20/2014  . Diastolic dysfunction 21/30/8657  . History of GI bleed 03/21/2014  . Dementia 09/01/2013  . Memory loss 09/01/2013  . Anxiety 09/01/2013  . Encounter for Medicare annual wellness exam 01/08/2013  . Gallstones 11/11/2011  . Arterial ischemic  stroke, 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  . Risk for falls 06/04/2011  . Constipation 03/05/2011  . Vitamin D deficiency 03/28/2009  . HYPERCHOLESTEROLEMIA, PURE 03/17/2007  . Essential hypertension 03/16/2007  . OSTEOARTHRITIS 03/16/2007  . Osteoporosis 03/16/2007    Past Surgical History:  Procedure Laterality Date  . ABDOMINAL HYSTERECTOMY     BSO- fibroids  . ABI's     normal  . dexa  1/05   osteoporosis  . left foot brace    . WRIST FRACTURE SURGERY  2/01   R arm    OB History    No data available       Home Medications    Prior to Admission medications   Medication Sig Start Date End Date Taking? Authorizing Provider  Cholecalciferol (VITAMIN D-3) 1000 units CAPS Take 1 capsule See admin instructions by mouth. Take 1000 units in the morning and 2000 in the evening   Yes [provider]  clopidogrel (PLAVIX) 75 MG tablet TAKE 1 TABLET BY MOUTH EVERY DAY Patient taking differently: TAKE 75 mg TABLET BY MOUTH EVERY DAY 02/27/17  Yes Millikan, Megan, NP  donepezil (ARICEPT) 10 MG tablet Take 1 tablet (10 mg total) by mouth at bedtime. 06/06/16  Yes Melvenia Beam, MD  Fexofenadine HCl (ALLERGY 24-HR PO) Take 1 capsule by mouth daily.   Yes [provider]  hydrochlorothiazide (HYDRODIURIL)  25 MG tablet Take 1 tablet (25 mg total) by mouth daily. 04/16/17  Yes Tower, Wynelle Fanny, MD  lisinopril (PRINIVIL,ZESTRIL) 20 MG tablet Take 1 tablet (20 mg total) by mouth daily. 04/16/17  Yes Tower, Wynelle Fanny, MD  memantine (NAMENDA) 10 MG tablet Take 1 tablet (10 mg total) by mouth 2 (two) times daily. 06/06/16  Yes Melvenia Beam, MD  Multiple Vitamins-Minerals ([employer] SPECTRAVITE PO) Take 1 capsule by mouth daily.   Yes [provider]  potassium chloride (KLOR-CON M10) 10 MEQ tablet Take 2 tablets (20 mEq total) by mouth daily. Patient taking differently: Take 20 mEq at bedtime by mouth.  04/16/17  Yes Tower, Wynelle Fanny, MD  vitamin B-12 (CYANOCOBALAMIN) 1000 MCG tablet Take  1,000 mcg by mouth daily.   Yes [provider]  methocarbamol (ROBAXIN) 500 MG tablet Take 1 tablet (500 mg total) every 8 (eight) hours as needed by mouth for muscle spasms. 05/09/17   Lavina Hamman, MD  metoprolol tartrate 37.5 MG TABS Take 37.5 mg 2 (two) times daily by mouth. 05/09/17   Lavina Hamman, MD  polyethylene glycol [facility] / Floria Raveling) packet Take 17 g daily by mouth. 05/10/17   Lavina Hamman, MD  senna-docusate (SENOKOT-S) 8.6-50 MG tablet Take 1 tablet at bedtime as needed by mouth for mild constipation. 05/09/17   Lavina Hamman, MD    Family History Family History  Problem Relation Age of Onset  . Heart attack Father   . Hypertension Father   . Heart attack Mother   . Throat cancer Brother     Social History Social History   Tobacco Use  . Smoking status: Never Smoker  . Smokeless tobacco: Never Used  Substance Use Topics  . Alcohol use: No    Alcohol/week: 0.0 oz  . Drug use: No     Allergies   Alendronate sodium; Amlodipine besylate; Calcitonin (salmon); and Simvastatin   Review of Systems Review of Systems  Unable to perform ROS: Dementia     Physical Exam Updated Vital Signs BP 134/74 (BP Location: Left Arm)   Pulse 69   Temp (!) 97.5 F (36.4 C) (Oral)   Resp 20   Ht 5' (1.524 m)   Wt 74.4 kg (164 lb)   SpO2 99%   BMI 32.03 kg/m   Physical Exam  Constitutional: She appears well-developed and well-nourished. No distress.  HENT:  Head: Normocephalic.  Contusion to the back of the head  Eyes: Conjunctivae and EOM are normal. Pupils are equal, round, and reactive to light.  Neck: Neck supple.  Cardiovascular: Normal rate, regular rhythm and normal heart sounds.  Pulmonary/Chest: Effort normal and breath sounds normal. No respiratory distress. She has no wheezes. She has no rales.  Abdominal: Soft. Bowel sounds are normal. She exhibits no distension. There is no tenderness. There is no rebound.  Musculoskeletal: She  exhibits no edema.  Large contusion to the right scapula and across the lower ribs over the back.  Large contusion over her sacrum.  Diffuse tenderness to palpation.  Full range of motion of bilateral shoulders with no pain.  Normal range of motion bilateral elbows, wrists, hands.  Pelvis is stable.  Full range of motion of bilateral hips.  Contusions to the anterior knees bilaterally, full range of motion of the knees.  Joints are stable with negative anterior posterior drawer signs.  Normal ankles and feet.  Neurological: She is alert.  Disoriented, however answers all the questions at times appropriately  at times not.  Patient is confused.  Moving all extremities.  Normal cranial nerves.  Normal neurological exam including 5 out of 5 and equal strength of upper and lower extremities bilaterally.  No pronator drift.  Skin: Skin is warm and dry. There is pallor.  Psychiatric: She has a normal mood and affect. Her behavior is normal.  Nursing note and vitals reviewed.    ED Treatments / Results  Labs (all labs ordered are listed, but only abnormal results are displayed) Labs Reviewed  URINALYSIS, ROUTINE W REFLEX MICROSCOPIC - Abnormal; Notable for the following components:      Result Value   Protein, ur 30 (*)    Leukocytes, UA SMALL (*)    Bacteria, UA RARE (*)    Squamous Epithelial / LPF 0-5 (*)    Non Squamous Epithelial 0-5 (*)    All other components within normal limits  CBC WITH DIFFERENTIAL/PLATELET - Abnormal; Notable for the following components:   Neutro Abs 8.3 (*)    All other components within normal limits  BASIC METABOLIC PANEL - Abnormal; Notable for the following components:   CO2 21 (*)    Glucose, Bld 144 (*)    Creatinine, Ser 1.24 (*)    GFR calc non Af Amer 38 (*)    GFR calc Af Amer 45 (*)    All other components within normal limits  TROPONIN I - Abnormal; Notable for the following components:   Troponin I 0.10 (*)    All other components within normal  limits  BASIC METABOLIC PANEL - Abnormal; Notable for the following components:   Potassium 3.3 (*)    Glucose, Bld 111 (*)    Creatinine, Ser 1.10 (*)    GFR calc non Af Amer 44 (*)    GFR calc Af Amer 52 (*)    All other components within normal limits  CBC - Abnormal; Notable for the following components:   RBC 3.78 (*)    Hemoglobin 11.4 (*)    HCT 35.6 (*)    All other components within normal limits  TROPONIN I - Abnormal; Notable for the following components:   Troponin I 0.06 (*)    All other components within normal limits  COMPREHENSIVE METABOLIC PANEL - Abnormal; Notable for the following components:   Chloride 112 (*)    Creatinine, Ser 1.03 (*)    Calcium 8.8 (*)    Total Protein 5.8 (*)    Albumin 3.2 (*)    ALT 13 (*)    GFR calc non Af Amer 48 (*)    GFR calc Af Amer 56 (*)    All other components within normal limits  CBC WITH DIFFERENTIAL/PLATELET - Abnormal; Notable for the following components:   RBC 3.59 (*)    Hemoglobin 11.1 (*)    HCT 34.2 (*)    Platelets 143 (*)    All other components within normal limits  CBC - Abnormal; Notable for the following components:   RBC 3.76 (*)    Hemoglobin 11.1 (*)    HCT 35.7 (*)    Platelets 142 (*)    All other components within normal limits  BASIC METABOLIC PANEL - Abnormal; Notable for the following components:   Creatinine, Ser 1.11 (*)    GFR calc non Af Amer 44 (*)    GFR calc Af Amer 51 (*)    All other components within normal limits  I-STAT TROPONIN, ED - Abnormal; Notable for the following components:  Troponin i, poc 0.15 (*)    All other components within normal limits  CK  MAGNESIUM  LACTIC ACID, PLASMA  LIPASE, BLOOD  MAGNESIUM  CK    EKG  EKG Interpretation  Date/Time:  Tuesday May 06 2017 21:04:28 EST Ventricular Rate:  97 PR Interval:  162 QRS Duration: 126 QT Interval:  390 QTC Calculation: 495 R Axis:     Text Interpretation:  Normal sinus rhythm Left bundle branch  block Abnormal ECG Confirmed by Virgel Manifold [id number]) on 05/07/2017 12:18:36 AM       Radiology No results found.  Procedures Procedures (including critical care time)  Medications Ordered in ED Medications  0.9 %  sodium chloride infusion ( Intravenous New Bag/Given 05/07/17 0021)  potassium chloride SA (K-DUR,KLOR-CON) CR tablet 40 mEq (40 mEq Oral Given 05/07/17 1536)     Initial Impression / Assessment and Plan / ED Course  I have reviewed the triage vital signs and the nursing notes.  Pertinent labs & imaging results that were available during my care of the patient were reviewed by me and considered in my medical decision making (see chart for details).     Patient after a fall at home, unwitnessed.  Patient appears to be pale.  She has no complaints.  She has large bruises to the back, back of the head, bilateral arms and anterior knees.  Will get basic labs EKG and troponin.  Will get urinalysis.  CT head and cervical spine ordered in triage.  Will image chest, back, sacrum.   7:42 PM UA negative. Xray with no acute findings. Trop elevated. LLB on ECG, hx of the same. Question cardiac reason for syncope? Pt apparently also had an episode in the waiting room where her eyes rolled back for few sec. Arrhythmia? Pt is full code at this time. Discussed with family. Will admit for observation and further work up.   Vitals:   05/09/17 0424 05/09/17 0920 05/09/17 1027 05/09/17 1501  BP: (!) 161/60 (!) 150/96 131/74 134/74  Pulse: 63 79 67 69  Resp: 16 18 19 20   Temp: 98 F (36.7 C) 97.7 F (36.5 C) 98.7 F (37.1 C) (!) 97.5 F (36.4 C)  TempSrc: Oral Oral Oral Oral  SpO2: 96% 96% 98% 99%  Weight:      Height:        Final Clinical Impressions(s) / ED Diagnoses   Final diagnoses:  Fall, initial encounter  Contusion of scalp, initial encounter  Contusion of right side of back, initial encounter  Contusion of sacrum, initial encounter  Elevated troponin    ED  Discharge Orders        Ordered    polyethylene glycol (MIRALAX / GLYCOLAX) packet  Daily     05/09/17 1204    methocarbamol (ROBAXIN) 500 MG tablet  Every 8 hours PRN     05/09/17 1204    metoprolol tartrate 37.5 MG TABS  2 times daily     05/09/17 1204    senna-docusate (SENOKOT-S) 8.6-50 MG tablet  At bedtime PRN     05/09/17 1204    Increase activity slowly     05/09/17 1204    Diet - low sodium heart healthy     05/09/17 1204    Discharge instructions    Comments:  It is important that you read following instructions as well as go over your medication list with RN to help you understand your care after this hospitalization.  Discharge Instructions: Please follow-up with PCP  in one week  Please request your primary care physician to go over all Hospital Tests and Procedure/Radiological results at the follow up,  Please get all Hospital records sent to your PCP by signing hospital release before you go home.   Do not drive, operating heavy machinery, perform activities at heights, swimming or participation in water activities or provide baby sitting services, until you have been seen by Primary Care Physician or a Neurologist and advised to do so again. Do not take more than prescribed Pain, Sleep and Anxiety Medications. You were cared for by a hospitalist during your hospital stay. If you have any questions about your discharge medications or the care you received while you were in the hospital after you are discharged, you can call the unit and ask to speak with the hospitalist on call if the hospitalist that took care of you is not available.  Once you are discharged, your primary care physician will handle any further medical issues. Please note that NO REFILLS for any discharge medications will be authorized once you are discharged, as it is imperative that you return to your primary care physician (or establish a relationship with a primary care physician if you do not have one)  for your aftercare needs so that they can reassess your need for medications and monitor your lab values. You Must read complete instructions/literature along with all the possible adverse reactions/side effects for all the Medicines you take and that have been prescribed to you. Take any new Medicines after you have completely understood and accept all the possible adverse reactions/side effects.   05/09/17 1204       Jeannett Senior, PA-C 05/07/17 0019    Jeannett Senior, PA-C 05/07/17 0027    Jeannett Senior, PA-C 05/13/17 1943    Virgel Manifold, MD 05/16/17 1301

## 2017-05-14 DIAGNOSIS — S300XXD Contusion of lower back and pelvis, subsequent encounter: Secondary | ICD-10-CM | POA: Diagnosis not present

## 2017-05-14 DIAGNOSIS — S0003XD Contusion of scalp, subsequent encounter: Secondary | ICD-10-CM | POA: Diagnosis not present

## 2017-05-14 DIAGNOSIS — R55 Syncope and collapse: Secondary | ICD-10-CM | POA: Diagnosis not present

## 2017-05-14 DIAGNOSIS — F0390 Unspecified dementia without behavioral disturbance: Secondary | ICD-10-CM | POA: Diagnosis not present

## 2017-05-14 DIAGNOSIS — I129 Hypertensive chronic kidney disease with stage 1 through stage 4 chronic kidney disease, or unspecified chronic kidney disease: Secondary | ICD-10-CM | POA: Diagnosis not present

## 2017-05-14 DIAGNOSIS — G9340 Encephalopathy, unspecified: Secondary | ICD-10-CM | POA: Diagnosis not present

## 2017-05-16 ENCOUNTER — Ambulatory Visit (INDEPENDENT_AMBULATORY_CARE_PROVIDER_SITE_OTHER): Payer: Medicare Other | Admitting: Family Medicine

## 2017-05-16 ENCOUNTER — Encounter: Payer: Self-pay | Admitting: Family Medicine

## 2017-05-16 VITALS — BP 118/70 | HR 64 | Temp 97.3°F | Wt 153.5 lb

## 2017-05-16 DIAGNOSIS — I10 Essential (primary) hypertension: Secondary | ICD-10-CM

## 2017-05-16 DIAGNOSIS — Z09 Encounter for follow-up examination after completed treatment for conditions other than malignant neoplasm: Secondary | ICD-10-CM

## 2017-05-16 DIAGNOSIS — S20211A Contusion of right front wall of thorax, initial encounter: Secondary | ICD-10-CM

## 2017-05-16 MED ORDER — METOPROLOL TARTRATE 25 MG PO TABS: 25.0000 mg | ORAL_TABLET | Freq: Two times a day (BID) | ORAL | 1 refills | Status: DC

## 2017-05-16 NOTE — Patient Instructions (Addendum)
Please decrease metoprolol to 25 mg (half of a 50 mg) twice a day

## 2017-05-16 NOTE — Progress Notes (Signed)
Subjective:    Patient ID: Wendy Krueger, female    DOB: 11-29-1929, 83 y.o.   MRN: [id number]  HPI This is an 83 year old patientt, accompanied by two of her daughters, presents today for hospital follow-up.  She was admitted 05/06/2017 and discharged 05/09/2017.  Prior to admission the patient had fallen.  She had a head CT and chest x-ray and spine x-ray all of which were normal.  She had a questionable episode of near syncope with diaphoresis and nausea.  Episode was thought to be questionable seizure activity and she had an EEG which showed questionable metabolic encephalopathy.  She was placed on Keppra but was unable to tolerate it.  She has an upcoming appointment with cardiology and neurology.   Today she reports her right ribs are sore. She she is having home physical therapy and feels like she is getting around better.  She is sleeping well and eating normally.  There have been no further presyncopal episodes at home since discharge.  She denies chest pain shortness of breath cough or lower extremity edema.  The only question he had today's about the patient's metoprolol.  Prior to admission she was on 50 mg twice a day.  She was discharged and instructed to take 37.5 mg twice a day but does not have a prescription.  The daughters wonder if she can take half of her 50 mg twice a day instead of getting a new prescription.  Past Medical History:  Diagnosis Date  . Asymptomatic gallstones   . Colitis - presumed infectious origin    one ER visit   . Dementia   . Hyperlipidemia   . Hypertension   . Osteoarthritis   . Osteoporosis    Past Surgical History:  Procedure Laterality Date  . ABDOMINAL HYSTERECTOMY     BSO- fibroids  . ABI's     normal  . dexa  1/05   osteoporosis  . ESOPHAGOGASTRODUODENOSCOPY (EGD) N/A 03/21/2014   Performed by Milus Banister, MD at [facility]  . left foot brace    . WRIST FRACTURE SURGERY  2/01   R arm   Family History  Problem Relation Age  of Onset  . Heart attack Father   . Hypertension Father   . Heart attack Mother   . Throat cancer Brother    Social History   Tobacco Use  . Smoking status: Never Smoker  . Smokeless tobacco: Never Used  Substance Use Topics  . Alcohol use: No    Alcohol/week: 0.0 oz  . Drug use: No    Current Outpatient Medications on File Prior to Visit  Medication Sig Dispense Refill  . Cholecalciferol (VITAMIN D-3) 1000 units CAPS Take 1 capsule See admin instructions by mouth. Take 1000 units in the morning and 2000 in the evening    . clopidogrel (PLAVIX) 75 MG tablet TAKE 1 TABLET BY MOUTH EVERY DAY (Patient taking differently: TAKE 75 mg TABLET BY MOUTH EVERY DAY) 30 tablet 11  . donepezil (ARICEPT) 10 MG tablet Take 1 tablet (10 mg total) by mouth at bedtime. 30 tablet 12  . Fexofenadine HCl (ALLERGY 24-HR PO) Take 1 capsule by mouth daily.    . hydrochlorothiazide (HYDRODIURIL) 25 MG tablet Take 1 tablet (25 mg total) by mouth daily. 90 tablet 3  . lisinopril (PRINIVIL,ZESTRIL) 20 MG tablet Take 1 tablet (20 mg total) by mouth daily. 90 tablet 3  . memantine (NAMENDA) 10 MG tablet Take 1 tablet (10 mg total) by mouth  2 (two) times daily. 60 tablet 12  . methocarbamol (ROBAXIN) 500 MG tablet Take 1 tablet (500 mg total) every 8 (eight) hours as needed by mouth for muscle spasms. 30 tablet 0  . Multiple Vitamins-Minerals ([employer]S SPECTRAVITE PO) Take 1 capsule by mouth daily.    . polyethylene glycol (MIRALAX / GLYCOLAX) packet Take 17 g daily by mouth. 14 each 0  . potassium chloride (KLOR-CON M10) 10 MEQ tablet Take 2 tablets (20 mEq total) by mouth daily. (Patient taking differently: Take 20 mEq at bedtime by mouth. ) 180 tablet 3  . senna-docusate (SENOKOT-S) 8.6-50 MG tablet Take 1 tablet at bedtime as needed by mouth for mild constipation. 30 tablet 0  . vitamin B-12 (CYANOCOBALAMIN) 1000 MCG tablet Take 1,000 mcg by mouth daily.     No current facility-administered medications on file  prior to visit.     Review of Systems Per HPI    Objective:   Physical Exam  Constitutional: She is oriented to person, place, and time. She appears well-developed and well-nourished. No distress.  HENT:  Head: Normocephalic and atraumatic.  Eyes: Conjunctivae are normal.  Cardiovascular: Normal rate, regular rhythm and normal heart sounds.  Pulmonary/Chest: Effort normal and breath sounds normal.  Musculoskeletal: She exhibits no edema.       Arms: Neurological: She is alert and oriented to person, place, and time.  Skin: She is not diaphoretic.  Vitals reviewed.     BP 118/70 (BP Location: Left Arm, Patient Position: Sitting, Cuff Size: Normal)   Pulse 64   Temp (!) 97.3 F (36.3 C) (Oral)   Wt 153 lb 8 oz (69.6 kg)   SpO2 97%   BMI 29.98 kg/m  Wt Readings from Last 3 Encounters:  05/16/17 153 lb 8 oz (69.6 kg)  05/06/17 164 lb (74.4 kg)  05/07/17 164 lb (74.4 kg)   BP Readings from Last 3 Encounters:  05/16/17 118/70  05/09/17 134/74  04/16/17 118/70       Assessment & Plan:  1. Hospital discharge follow-up - reviewed medications and upcoming follow-up visits with cardiology and neurology -Return to clinic/ER precautions reviewed  2. Contusion of rib on right side, initial encounter -Continue acetaminophen and apply heat as needed for pain encouraged her to continue activity as tolerated  3. Essential hypertension -Blood pressures have been well controlled and heart rate is in the 60s today family will use the patient's current supply of  50 mg metoprolol and cut in half and administer twice a day - metoprolol tartrate (LOPRESSOR) 25 MG tablet; Take 1 tablet (25 mg total) 2 (two) times daily by mouth.  Dispense: 180 tablet; Refill: 1  -Follow-up with Dr. Glori Bickers in 3 months  Clarene Reamer, FNP-BC  [facility] [place]  05/16/2017 10:47 AM

## 2017-05-19 DIAGNOSIS — S300XXD Contusion of lower back and pelvis, subsequent encounter: Secondary | ICD-10-CM | POA: Diagnosis not present

## 2017-05-19 DIAGNOSIS — G9340 Encephalopathy, unspecified: Secondary | ICD-10-CM | POA: Diagnosis not present

## 2017-05-19 DIAGNOSIS — R55 Syncope and collapse: Secondary | ICD-10-CM | POA: Diagnosis not present

## 2017-05-19 DIAGNOSIS — I129 Hypertensive chronic kidney disease with stage 1 through stage 4 chronic kidney disease, or unspecified chronic kidney disease: Secondary | ICD-10-CM | POA: Diagnosis not present

## 2017-05-19 DIAGNOSIS — S0003XD Contusion of scalp, subsequent encounter: Secondary | ICD-10-CM | POA: Diagnosis not present

## 2017-05-19 DIAGNOSIS — F0390 Unspecified dementia without behavioral disturbance: Secondary | ICD-10-CM | POA: Diagnosis not present

## 2017-05-21 DIAGNOSIS — S300XXD Contusion of lower back and pelvis, subsequent encounter: Secondary | ICD-10-CM | POA: Diagnosis not present

## 2017-05-21 DIAGNOSIS — F0390 Unspecified dementia without behavioral disturbance: Secondary | ICD-10-CM | POA: Diagnosis not present

## 2017-05-21 DIAGNOSIS — R55 Syncope and collapse: Secondary | ICD-10-CM | POA: Diagnosis not present

## 2017-05-21 DIAGNOSIS — G9340 Encephalopathy, unspecified: Secondary | ICD-10-CM | POA: Diagnosis not present

## 2017-05-21 DIAGNOSIS — I129 Hypertensive chronic kidney disease with stage 1 through stage 4 chronic kidney disease, or unspecified chronic kidney disease: Secondary | ICD-10-CM | POA: Diagnosis not present

## 2017-05-21 DIAGNOSIS — S0003XD Contusion of scalp, subsequent encounter: Secondary | ICD-10-CM | POA: Diagnosis not present

## 2017-05-26 ENCOUNTER — Ambulatory Visit (INDEPENDENT_AMBULATORY_CARE_PROVIDER_SITE_OTHER): Payer: Medicare Other

## 2017-05-26 DIAGNOSIS — R55 Syncope and collapse: Secondary | ICD-10-CM | POA: Diagnosis not present

## 2017-05-27 DIAGNOSIS — S0003XD Contusion of scalp, subsequent encounter: Secondary | ICD-10-CM | POA: Diagnosis not present

## 2017-05-27 DIAGNOSIS — G9340 Encephalopathy, unspecified: Secondary | ICD-10-CM | POA: Diagnosis not present

## 2017-05-27 DIAGNOSIS — F0390 Unspecified dementia without behavioral disturbance: Secondary | ICD-10-CM | POA: Diagnosis not present

## 2017-05-27 DIAGNOSIS — S300XXD Contusion of lower back and pelvis, subsequent encounter: Secondary | ICD-10-CM | POA: Diagnosis not present

## 2017-05-27 DIAGNOSIS — R55 Syncope and collapse: Secondary | ICD-10-CM | POA: Diagnosis not present

## 2017-05-27 DIAGNOSIS — I129 Hypertensive chronic kidney disease with stage 1 through stage 4 chronic kidney disease, or unspecified chronic kidney disease: Secondary | ICD-10-CM | POA: Diagnosis not present

## 2017-05-30 DIAGNOSIS — F0390 Unspecified dementia without behavioral disturbance: Secondary | ICD-10-CM | POA: Diagnosis not present

## 2017-05-30 DIAGNOSIS — S0003XD Contusion of scalp, subsequent encounter: Secondary | ICD-10-CM | POA: Diagnosis not present

## 2017-05-30 DIAGNOSIS — S300XXD Contusion of lower back and pelvis, subsequent encounter: Secondary | ICD-10-CM | POA: Diagnosis not present

## 2017-05-30 DIAGNOSIS — R55 Syncope and collapse: Secondary | ICD-10-CM | POA: Diagnosis not present

## 2017-05-30 DIAGNOSIS — I129 Hypertensive chronic kidney disease with stage 1 through stage 4 chronic kidney disease, or unspecified chronic kidney disease: Secondary | ICD-10-CM | POA: Diagnosis not present

## 2017-05-30 DIAGNOSIS — G9340 Encephalopathy, unspecified: Secondary | ICD-10-CM | POA: Diagnosis not present

## 2017-06-03 DIAGNOSIS — G9340 Encephalopathy, unspecified: Secondary | ICD-10-CM | POA: Diagnosis not present

## 2017-06-03 DIAGNOSIS — F0390 Unspecified dementia without behavioral disturbance: Secondary | ICD-10-CM | POA: Diagnosis not present

## 2017-06-03 DIAGNOSIS — S300XXD Contusion of lower back and pelvis, subsequent encounter: Secondary | ICD-10-CM | POA: Diagnosis not present

## 2017-06-03 DIAGNOSIS — S0003XD Contusion of scalp, subsequent encounter: Secondary | ICD-10-CM | POA: Diagnosis not present

## 2017-06-03 DIAGNOSIS — R55 Syncope and collapse: Secondary | ICD-10-CM | POA: Diagnosis not present

## 2017-06-03 DIAGNOSIS — I129 Hypertensive chronic kidney disease with stage 1 through stage 4 chronic kidney disease, or unspecified chronic kidney disease: Secondary | ICD-10-CM | POA: Diagnosis not present

## 2017-06-06 DIAGNOSIS — I129 Hypertensive chronic kidney disease with stage 1 through stage 4 chronic kidney disease, or unspecified chronic kidney disease: Secondary | ICD-10-CM | POA: Diagnosis not present

## 2017-06-06 DIAGNOSIS — R55 Syncope and collapse: Secondary | ICD-10-CM | POA: Diagnosis not present

## 2017-06-06 DIAGNOSIS — G9340 Encephalopathy, unspecified: Secondary | ICD-10-CM | POA: Diagnosis not present

## 2017-06-06 DIAGNOSIS — S0003XD Contusion of scalp, subsequent encounter: Secondary | ICD-10-CM | POA: Diagnosis not present

## 2017-06-06 DIAGNOSIS — S300XXD Contusion of lower back and pelvis, subsequent encounter: Secondary | ICD-10-CM | POA: Diagnosis not present

## 2017-06-06 DIAGNOSIS — F0390 Unspecified dementia without behavioral disturbance: Secondary | ICD-10-CM | POA: Diagnosis not present

## 2017-06-09 ENCOUNTER — Ambulatory Visit: Payer: Medicare Other | Admitting: Adult Health

## 2017-06-12 DIAGNOSIS — R55 Syncope and collapse: Secondary | ICD-10-CM | POA: Diagnosis not present

## 2017-06-12 DIAGNOSIS — S300XXD Contusion of lower back and pelvis, subsequent encounter: Secondary | ICD-10-CM | POA: Diagnosis not present

## 2017-06-12 DIAGNOSIS — F0390 Unspecified dementia without behavioral disturbance: Secondary | ICD-10-CM | POA: Diagnosis not present

## 2017-06-12 DIAGNOSIS — I129 Hypertensive chronic kidney disease with stage 1 through stage 4 chronic kidney disease, or unspecified chronic kidney disease: Secondary | ICD-10-CM | POA: Diagnosis not present

## 2017-06-12 DIAGNOSIS — S0003XD Contusion of scalp, subsequent encounter: Secondary | ICD-10-CM | POA: Diagnosis not present

## 2017-06-12 DIAGNOSIS — G9340 Encephalopathy, unspecified: Secondary | ICD-10-CM | POA: Diagnosis not present

## 2017-06-21 ENCOUNTER — Other Ambulatory Visit: Payer: Self-pay | Admitting: Neurology

## 2017-06-25 ENCOUNTER — Other Ambulatory Visit: Payer: Self-pay | Admitting: Family Medicine

## 2017-06-25 DIAGNOSIS — I10 Essential (primary) hypertension: Secondary | ICD-10-CM

## 2017-06-26 MED ORDER — METOPROLOL TARTRATE 25 MG PO TABS: 25.0000 mg | ORAL_TABLET | Freq: Two times a day (BID) | ORAL | 1 refills | Status: DC

## 2017-07-05 ENCOUNTER — Other Ambulatory Visit: Payer: Self-pay

## 2017-07-05 ENCOUNTER — Emergency Department (HOSPITAL_COMMUNITY)
Admission: EM | Admit: 2017-07-05 | Discharge: 2017-07-05 | Disposition: A | Discharge status: Home / Self Care | Payer: Medicare Other | Attending: Emergency Medicine | Admitting: Emergency Medicine

## 2017-07-05 ENCOUNTER — Emergency Department (HOSPITAL_COMMUNITY): Payer: Medicare Other

## 2017-07-05 ENCOUNTER — Encounter (HOSPITAL_COMMUNITY): Payer: Self-pay | Admitting: *Deleted

## 2017-07-05 DIAGNOSIS — H9191 Unspecified hearing loss, right ear: Secondary | ICD-10-CM | POA: Diagnosis not present

## 2017-07-05 DIAGNOSIS — Z7902 Long term (current) use of antithrombotics/antiplatelets: Secondary | ICD-10-CM | POA: Diagnosis not present

## 2017-07-05 DIAGNOSIS — R4182 Altered mental status, unspecified: Secondary | ICD-10-CM | POA: Diagnosis not present

## 2017-07-05 DIAGNOSIS — F0390 Unspecified dementia without behavioral disturbance: Secondary | ICD-10-CM | POA: Diagnosis not present

## 2017-07-05 DIAGNOSIS — Z79899 Other long term (current) drug therapy: Secondary | ICD-10-CM | POA: Diagnosis not present

## 2017-07-05 DIAGNOSIS — I10 Essential (primary) hypertension: Secondary | ICD-10-CM | POA: Diagnosis not present

## 2017-07-05 DIAGNOSIS — Z8673 Personal history of transient ischemic attack (TIA), and cerebral infarction without residual deficits: Secondary | ICD-10-CM | POA: Diagnosis not present

## 2017-07-05 DIAGNOSIS — R410 Disorientation, unspecified: Secondary | ICD-10-CM | POA: Diagnosis not present

## 2017-07-05 LAB — URINALYSIS, ROUTINE W REFLEX MICROSCOPIC
Bilirubin Urine: NEGATIVE
GLUCOSE, UA: NEGATIVE mg/dL
HGB URINE DIPSTICK: NEGATIVE
Ketones, ur: NEGATIVE mg/dL
Leukocytes, UA: NEGATIVE
Nitrite: NEGATIVE
PROTEIN: NEGATIVE mg/dL
Specific Gravity, Urine: 1.01 (ref 1.005–1.030)
pH: 7 (ref 5.0–8.0)

## 2017-07-05 LAB — COMPREHENSIVE METABOLIC PANEL
ALK PHOS: 67 U/L (ref 38–126)
ALT: 12 U/L — AB (ref 14–54)
AST: 22 U/L (ref 15–41)
Albumin: 3.9 g/dL (ref 3.5–5.0)
Anion gap: 8 (ref 5–15)
BILIRUBIN TOTAL: 0.5 mg/dL (ref 0.3–1.2)
BUN: 15 mg/dL (ref 6–20)
CHLORIDE: 106 mmol/L (ref 101–111)
CO2: 26 mmol/L (ref 22–32)
CREATININE: 1.24 mg/dL — AB (ref 0.44–1.00)
Calcium: 10.3 mg/dL (ref 8.9–10.3)
GFR calc Af Amer: 45 mL/min — ABNORMAL LOW (ref >60)
GFR, EST NON AFRICAN AMERICAN: 38 mL/min — AB (ref >60)
Glucose, Bld: 111 mg/dL — ABNORMAL HIGH (ref 65–99)
Potassium: 4 mmol/L (ref 3.5–5.1)
Sodium: 140 mmol/L (ref 135–145)
Total Protein: 7.3 g/dL (ref 6.5–8.1)

## 2017-07-05 LAB — CBC
HCT: 42.5 % (ref 36.0–46.0)
Hemoglobin: 13.5 g/dL (ref 12.0–15.0)
MCH: 30.1 pg (ref 26.0–34.0)
MCHC: 31.8 g/dL (ref 30.0–36.0)
MCV: 94.9 fL (ref 78.0–100.0)
PLATELETS: 199 10*3/uL (ref 150–400)
RBC: 4.48 MIL/uL (ref 3.87–5.11)
RDW: 12.5 % (ref 11.5–15.5)
WBC: 5.4 10*3/uL (ref 4.0–10.5)

## 2017-07-05 LAB — PROTIME-INR
INR: 0.98
Prothrombin Time: 12.9 seconds (ref 11.4–15.2)

## 2017-07-05 LAB — I-STAT CG4 LACTIC ACID, ED
Lactic Acid, Venous: 2.01 mmol/L (ref 0.5–1.9)
Lactic Acid, Venous: 1.87 mmol/L (ref 0.5–1.9)

## 2017-07-05 LAB — CBG MONITORING, ED
Glucose-Capillary: 113 mg/dL — ABNORMAL HIGH (ref 65–99)
Glucose-Capillary: 70 mg/dL (ref 65–99)

## 2017-07-05 MED ORDER — SODIUM CHLORIDE 0.9 % IV BOLUS (SEPSIS)
500.0000 mL | Freq: Once | INTRAVENOUS | Status: AC
  Administered 2017-07-05: 500 mL via INTRAVENOUS

## 2017-07-05 NOTE — ED Notes (Signed)
pts caregiver understood dc material. NAD noted.

## 2017-07-05 NOTE — ED Triage Notes (Addendum)
PT brought here by her daughter who she lives with for altered behavior.  Pt normally sleeps in and allows family to dress her (hx of dementia).  However, this am she was up early, getting around using furniture and stating "I have to go home".  Speech is slurred, though no other deficits noted.  Pt states she cannot hear out of her R ear, but denies any pain.

## 2017-07-05 NOTE — ED Notes (Signed)
PurWick applied 

## 2017-07-05 NOTE — ED Notes (Signed)
CBG-70 

## 2017-07-05 NOTE — ED Provider Notes (Signed)
Elderly female with a history of dementia who presents to the emergency department with altered mental status.  Initial workup thought to be dehydration but currently pending on UA to rule out infectious process.  Current plan is to follow-up the urine.  If negative patient is safe for discharge home with follow-up with her primary care provider for repeat of her renal function due to mild bump in her creatinine.  UA negative.  The patient is safe for discharge with strict return precautions.  Disposition: Discharge  Condition: Good  I have discussed the results, Dx and Tx plan with the patient's family who expressed understanding and agree(s) with the plan. Discharge instructions discussed at great length. The patient's family were given strict return precautions who verbalized understanding of the instructions. No further questions at time of discharge.    ED Discharge Orders    None       Follow Up: Tower, Wynelle Fanny, MD [place] [address] [id number] [phone]   in 5-7 days to recheck renal function      Alizay Bronkema, Grayce Sessions, MD 07/05/17 Einar Crow

## 2017-07-05 NOTE — ED Provider Notes (Signed)
[facility] EMERGENCY DEPARTMENT Provider Note   CSN: [id number] Arrival date & time: 07/05/17  1136     History   Chief Complaint Chief Complaint  Patient presents with  . Altered Mental Status    HPI KYRAN WHITTIER is a 84 y.o. female.  Patient is a 84 year old female with a history of dementia, hypertension and hyperlipidemia who presents with confusion.  She lives at home with her daughter.  Her daughter states that this morning she jumped out of bed and got dressed on her own which she normally does not and said that she was going to go drive her car and take care of her chickens.  She normally has some baseline confusion that she is more confused than she normally is today.  She has not been agitated or combative.  No known fevers.  No vomiting or diarrhea.  No recent cough or cold symptoms.  She is been saying that she cannot hear as well out of her right ear and has been sticking Q-tips in her ear.  No other recent illnesses.      Past Medical History:  Diagnosis Date  . Asymptomatic gallstones   . Colitis - presumed infectious origin    one ER visit   . Dementia   . Hyperlipidemia   . Hypertension   . Osteoarthritis   . Osteoporosis     Patient Active Problem List   Diagnosis Date Noted  . Acute encephalopathy 05/08/2017  . Fall 05/07/2017  . Near syncope 05/07/2017  . Contusion of scalp 05/07/2017  . Contusion of sacrum 05/07/2017  . Contusion of right side of back 05/07/2017  . Cerebellar stroke ([place]) 03/10/2016  . Carotid aneurysm, right ([place]) 02/21/2016  . Lipoma of shoulder 03/27/2015  . History of closed Colles' fracture 09/06/2014  . Allergic rhinitis 06/20/2014  . Diastolic dysfunction 74/94/4967  . History of GI bleed 03/21/2014  . Dementia 09/01/2013  . Memory loss 09/01/2013  . Anxiety 09/01/2013  . Encounter for Medicare annual wellness exam 01/08/2013  . Gallstones 11/11/2011  . Arterial ischemic stroke, chronic  08/12/2011  . Risk for falls 06/04/2011  . Constipation 03/05/2011  . Vitamin D deficiency 03/28/2009  . HYPERCHOLESTEROLEMIA, PURE 03/17/2007  . Essential hypertension 03/16/2007  . OSTEOARTHRITIS 03/16/2007  . Osteoporosis 03/16/2007    Past Surgical History:  Procedure Laterality Date  . ABDOMINAL HYSTERECTOMY     BSO- fibroids  . ABI's     normal  . dexa  1/05   osteoporosis  . ESOPHAGOGASTRODUODENOSCOPY N/A 03/21/2014   Procedure: ESOPHAGOGASTRODUODENOSCOPY (EGD);  Surgeon: Milus Banister, MD;  Location: [facility];  Service: Endoscopy;  Laterality: N/A;  . left foot brace    . WRIST FRACTURE SURGERY  2/01   R arm    OB History    No data available       Home Medications    Prior to Admission medications   Medication Sig Start Date End Date Taking? Authorizing Provider  Cholecalciferol (VITAMIN D-3) 1000 units CAPS Take 1 capsule See admin instructions by mouth. Take 1000 units in the morning and 2000 in the evening   Yes [provider]  clopidogrel (PLAVIX) 75 MG tablet TAKE 1 TABLET BY MOUTH EVERY DAY Patient taking differently: TAKE 75 mg TABLET BY MOUTH EVERY DAY 02/27/17  Yes Ward Givens, NP  donepezil (ARICEPT) 10 MG tablet TAKE 1 TABLET (10 MG TOTAL) BY MOUTH AT BEDTIME. 06/25/17  Yes Melvenia Beam, MD  Fexofenadine  HCl (ALLERGY 24-HR PO) Take 1 capsule by mouth daily.   Yes [provider]  hydrochlorothiazide (HYDRODIURIL) 25 MG tablet Take 1 tablet (25 mg total) by mouth daily. 04/16/17  Yes Tower, Wynelle Fanny, MD  lisinopril (PRINIVIL,ZESTRIL) 20 MG tablet Take 1 tablet (20 mg total) by mouth daily. 04/16/17  Yes Tower, Wynelle Fanny, MD  memantine (NAMENDA) 10 MG tablet TAKE 1 TABLET (10 MG TOTAL) BY MOUTH 2 (TWO) TIMES DAILY. 06/25/17  Yes Melvenia Beam, MD  methocarbamol (ROBAXIN) 500 MG tablet Take 1 tablet (500 mg total) every 8 (eight) hours as needed by mouth for muscle spasms. 05/09/17  Yes Lavina Hamman, MD  metoprolol tartrate  (LOPRESSOR) 25 MG tablet Take 1 tablet (25 mg total) by mouth 2 (two) times daily. 06/26/17  Yes Tower, Wynelle Fanny, MD  Multiple Vitamins-Minerals ([employer] SPECTRAVITE PO) Take 1 capsule by mouth daily.   Yes [provider]  potassium chloride (KLOR-CON M10) 10 MEQ tablet Take 2 tablets (20 mEq total) by mouth daily. Patient taking differently: Take 20 mEq at bedtime by mouth.  04/16/17  Yes Tower, Wynelle Fanny, MD  senna-docusate (SENOKOT-S) 8.6-50 MG tablet Take 1 tablet at bedtime as needed by mouth for mild constipation. 05/09/17  Yes Lavina Hamman, MD  vitamin B-12 (CYANOCOBALAMIN) 1000 MCG tablet Take 1,000 mcg by mouth daily.   Yes [provider]    Family History Family History  Problem Relation Age of Onset  . Heart attack Father   . Hypertension Father   . Heart attack Mother   . Throat cancer Brother     Social History Social History   Tobacco Use  . Smoking status: Never Smoker  . Smokeless tobacco: Never Used  Substance Use Topics  . Alcohol use: No    Alcohol/week: 0.0 oz  . Drug use: No     Allergies   Alendronate sodium; Amlodipine besylate; Calcitonin (salmon); and Simvastatin   Review of Systems Review of Systems  Unable to perform ROS: Dementia     Physical Exam Updated Vital Signs BP (!) 172/85 (BP Location: Left Arm)   Pulse 64   Temp 98.2 F (36.8 C) (Rectal)   Resp (!) 21   Ht 5' (1.524 m)   Wt 69.4 kg (153 lb)   SpO2 100%   BMI 29.88 kg/m   Physical Exam  Constitutional: She appears well-developed and well-nourished.  HENT:  Head: Normocephalic and atraumatic.  Eyes: Pupils are equal, round, and reactive to light.  Neck: Normal range of motion. Neck supple.  Cardiovascular: Normal rate, regular rhythm and normal heart sounds.  Pulmonary/Chest: Effort normal and breath sounds normal. No respiratory distress. She has no wheezes. She has no rales. She exhibits no tenderness.  Abdominal: Soft. Bowel sounds are normal. There is  no tenderness. There is no rebound and no guarding.  Musculoskeletal: Normal range of motion. She exhibits no edema.  Lymphadenopathy:    She has no cervical adenopathy.  Neurological: She is alert.  Patient is alert and very pleasant.  She is conversational but she is confused.  She moves all extremities symmetrically without evidence of focal deficits.  Motor 5 out of 5 all extremities, sensation grossly intact light touch all extremities, no pronator drift.  No evident cranial nerve deficit.  Skin: Skin is warm and dry. No rash noted.  Psychiatric: She has a normal mood and affect.     ED Treatments / Results  Labs (all labs ordered are listed, but  only abnormal results are displayed) Labs Reviewed  COMPREHENSIVE METABOLIC PANEL - Abnormal; Notable for the following components:      Result Value   Glucose, Bld 111 (*)    Creatinine, Ser 1.24 (*)    ALT 12 (*)    GFR calc non Af Amer 38 (*)    GFR calc Af Amer 45 (*)    All other components within normal limits  CBG MONITORING, ED - Abnormal; Notable for the following components:   Glucose-Capillary 113 (*)    All other components within normal limits  I-STAT CG4 LACTIC ACID, ED - Abnormal; Notable for the following components:   Lactic Acid, Venous 2.01 (*)    All other components within normal limits  CULTURE, BLOOD (ROUTINE X 2)  CULTURE, BLOOD (ROUTINE X 2)  CBC  PROTIME-INR  URINALYSIS, ROUTINE W REFLEX MICROSCOPIC  I-STAT CG4 LACTIC ACID, ED  CBG MONITORING, ED  I-STAT CG4 LACTIC ACID, ED  I-STAT CG4 LACTIC ACID, ED    EKG  EKG Interpretation  Date/Time:  Saturday July 05 2017 13:26:01 EST Ventricular Rate:  59 PR Interval:    QRS Duration: 143 QT Interval:  445 QTC Calculation: 441 R Axis:   1 Text Interpretation:  Sinus rhythm Left bundle branch block Baseline wander in lead(s) I II III aVR aVL V1 V2 V4 V5 V6 since last tracing no significant change Confirmed by Malvin Johns [phone]) on 07/05/2017 1:51:58  PM       Radiology Dg Chest 2 View  Result Date: 07/05/2017 CLINICAL DATA:  Mental status changes. EXAM: CHEST  2 VIEW COMPARISON:  05/06/2017 FINDINGS: AP and lateral views of the chest for obtained. The cardio pericardial silhouette is enlarged. Interstitial markings are diffusely coarsened with chronic features. Small hiatal hernia noted. The visualized bony structures of the thorax are intact. Telemetry leads overlie the chest. IMPRESSION: Cardiomegaly without acute cardiopulmonary findings. Electronically Signed   By: Misty Stanley M.D.   On: 07/05/2017 14:23   Ct Head Wo Contrast  Result Date: 07/05/2017 CLINICAL DATA:  Confusion this morning. Slurred speech, now resolved. History of dementia. EXAM: CT HEAD WITHOUT CONTRAST TECHNIQUE: Contiguous axial images were obtained from the base of the skull through the vertex without intravenous contrast. COMPARISON:  Brain MRI 05/07/2017 and CT 05/06/2017 FINDINGS: Brain: There is no evidence of acute infarct, intracranial hemorrhage, mass, midline shift, or extra-axial fluid collection. Moderate cerebral atrophy is unchanged. Periventricular white matter hypodensities are similar to the prior CT and compatible with mild chronic small vessel ischemic disease. Vascular: Calcified atherosclerosis at the skullbase. Similar appearance of a 9 mm fusiform right ICA terminus aneurysm. No acute hyperdense vessel. Skull: No fracture or focal osseous lesion. Sinuses/Orbits: Visualized paranasal sinuses and mastoid air cells are clear. Unremarkable orbits. Other: None. IMPRESSION: 1. No evidence of acute intracranial abnormality. 2. Mild chronic small vessel ischemic disease and moderate cerebral atrophy. Electronically Signed   By: Logan Bores M.D.   On: 07/05/2017 15:06    Procedures Procedures (including critical care time)  Medications Ordered in ED Medications  sodium chloride 0.9 % bolus 500 mL (500 mLs Intravenous New Bag/Given 07/05/17 1404)      Initial Impression / Assessment and Plan / ED Course  I have reviewed the triage vital signs and the nursing notes.  Pertinent labs & imaging results that were available during my care of the patient were reviewed by me and considered in my medical decision making (see chart for details).  Patient is a 84 year old female who presents with change in behavior.  She is a little more confused than normal.  She is awake and fully alert.  She has no focal neurologic deficits.  She has a minimally elevated lactate but no fever.  No tachycardia.  No hypotension.  No other suggestions of sepsis.  Her chest x-ray is normal.  She appears to be mildly dehydrated and was given some IV fluids.  Her creatinine is minimally elevated as compared to her baseline.  I feel that she can ultimately be discharged.  Her urinalysis is pending.  I did advise the family that they need to have her follow-up with her PCP.  Dr. Leonette Monarch to follow-up on urinalysis and discharge as appropriate.  Final Clinical Impressions(s) / ED Diagnoses   Final diagnoses:  None    ED Discharge Orders    None       Malvin Johns, MD 07/05/17 1704

## 2017-07-06 ENCOUNTER — Telehealth (HOSPITAL_BASED_OUTPATIENT_CLINIC_OR_DEPARTMENT_OTHER): Payer: Self-pay | Admitting: Emergency Medicine

## 2017-07-06 LAB — BLOOD CULTURE ID PANEL (REFLEXED)
Acinetobacter baumannii: NOT DETECTED
CANDIDA GLABRATA: NOT DETECTED
CANDIDA TROPICALIS: NOT DETECTED
Candida albicans: NOT DETECTED
Candida krusei: NOT DETECTED
Candida parapsilosis: NOT DETECTED
ENTEROBACTER CLOACAE COMPLEX: NOT DETECTED
Enterobacteriaceae species: NOT DETECTED
Enterococcus species: NOT DETECTED
Escherichia coli: NOT DETECTED
HAEMOPHILUS INFLUENZAE: NOT DETECTED
KLEBSIELLA PNEUMONIAE: NOT DETECTED
Klebsiella oxytoca: NOT DETECTED
Listeria monocytogenes: NOT DETECTED
NEISSERIA MENINGITIDIS: NOT DETECTED
PROTEUS SPECIES: NOT DETECTED
Pseudomonas aeruginosa: NOT DETECTED
SERRATIA MARCESCENS: NOT DETECTED
STAPHYLOCOCCUS AUREUS BCID: NOT DETECTED
STAPHYLOCOCCUS SPECIES: NOT DETECTED
STREPTOCOCCUS AGALACTIAE: NOT DETECTED
Streptococcus pneumoniae: NOT DETECTED
Streptococcus pyogenes: NOT DETECTED
Streptococcus species: DETECTED — AB

## 2017-07-07 ENCOUNTER — Ambulatory Visit (INDEPENDENT_AMBULATORY_CARE_PROVIDER_SITE_OTHER): Payer: Medicare Other | Admitting: Internal Medicine

## 2017-07-07 ENCOUNTER — Encounter: Payer: Self-pay | Admitting: Internal Medicine

## 2017-07-07 VITALS — BP 124/84 | HR 68 | Temp 97.4°F | Wt 166.5 lb

## 2017-07-07 DIAGNOSIS — R7881 Bacteremia: Secondary | ICD-10-CM

## 2017-07-07 DIAGNOSIS — E872 Acidosis, unspecified: Secondary | ICD-10-CM

## 2017-07-07 DIAGNOSIS — E860 Dehydration: Secondary | ICD-10-CM

## 2017-07-07 DIAGNOSIS — R410 Disorientation, unspecified: Secondary | ICD-10-CM

## 2017-07-07 DIAGNOSIS — H6123 Impacted cerumen, bilateral: Secondary | ICD-10-CM

## 2017-07-07 NOTE — Patient Instructions (Signed)

## 2017-07-07 NOTE — Progress Notes (Signed)
Subjective:    Patient ID: Wendy Krueger, female    DOB: 12-Jan-1930, 84 y.o.   MRN: [id number]  HPI  Pt presents to the clinic today for ER follow up . She went to the ER 07/05/17 with c/o confusion. ECG showed no acute findings. Chest xray was normal. Her labs were concerning for dehydration vs infection. Urinalysis was normal. She was given IV fluids. She was stable, so they discharged her and advised her to follow up with her PCP. They called her yesterday and told her 1/2 of her blood cultures grew out streptococcus, and wanted her to return to the ER for followup. Her daughters report that they did not want to take her back to the ER. Since discharge, she has been doing well. No fevers, less confusion, no falls.  Her daughters would also like me to take a look in her ears to see if she has wax that needs removed.  Review of Systems      Past Medical History:  Diagnosis Date  . Asymptomatic gallstones   . Colitis - presumed infectious origin    one ER visit   . Dementia   . Hyperlipidemia   . Hypertension   . Osteoarthritis   . Osteoporosis     Current Outpatient Medications  Medication Sig Dispense Refill  . Cholecalciferol (VITAMIN D-3) 1000 units CAPS Take 1 capsule See admin instructions by mouth. Take 1000 units in the morning and 2000 in the evening    . clopidogrel (PLAVIX) 75 MG tablet TAKE 1 TABLET BY MOUTH EVERY DAY (Patient taking differently: TAKE 75 mg TABLET BY MOUTH EVERY DAY) 30 tablet 11  . donepezil (ARICEPT) 10 MG tablet TAKE 1 TABLET (10 MG TOTAL) BY MOUTH AT BEDTIME. 30 tablet 11  . Fexofenadine HCl (ALLERGY 24-HR PO) Take 1 capsule by mouth daily.    . hydrochlorothiazide (HYDRODIURIL) 25 MG tablet Take 1 tablet (25 mg total) by mouth daily. 90 tablet 3  . lisinopril (PRINIVIL,ZESTRIL) 20 MG tablet Take 1 tablet (20 mg total) by mouth daily. 90 tablet 3  . memantine (NAMENDA) 10 MG tablet TAKE 1 TABLET (10 MG TOTAL) BY MOUTH 2 (TWO) TIMES DAILY. 60  tablet 11  . methocarbamol (ROBAXIN) 500 MG tablet Take 1 tablet (500 mg total) every 8 (eight) hours as needed by mouth for muscle spasms. 30 tablet 0  . metoprolol tartrate (LOPRESSOR) 25 MG tablet Take 1 tablet (25 mg total) by mouth 2 (two) times daily. 180 tablet 1  . Multiple Vitamins-Minerals ([employer] SPECTRAVITE PO) Take 1 capsule by mouth daily.    . potassium chloride (KLOR-CON M10) 10 MEQ tablet Take 2 tablets (20 mEq total) by mouth daily. (Patient taking differently: Take 20 mEq at bedtime by mouth. ) 180 tablet 3  . senna-docusate (SENOKOT-S) 8.6-50 MG tablet Take 1 tablet at bedtime as needed by mouth for mild constipation. 30 tablet 0  . vitamin B-12 (CYANOCOBALAMIN) 1000 MCG tablet Take 1,000 mcg by mouth daily.     No current facility-administered medications for this visit.     Allergies  Allergen Reactions  . Alendronate Sodium Other (See Comments)    Leg pain   . Amlodipine Besylate Hives  . Calcitonin ([place]) Other (See Comments)     headache/ head pressure  . Simvastatin Other (See Comments)    Leg pain     Family History  Problem Relation Age of Onset  . Heart attack Father   . Hypertension Father   .  Heart attack Mother   . Throat cancer Brother     Social History   Socioeconomic History  . Marital status: Widowed    Spouse name: Not on file  . Number of children: 5  . Years of education: 35  . Highest education level: Not on file  Social Needs  . Financial resource strain: Not on file  . Food insecurity - worry: Not on file  . Food insecurity - inability: Not on file  . Transportation needs - medical: Not on file  . Transportation needs - non-medical: Not on file  Occupational History  . Occupation: retired    Fish farm manager: RETIRED  Tobacco Use  . Smoking status: Never Smoker  . Smokeless tobacco: Never Used  Substance and Sexual Activity  . Alcohol use: No    Alcohol/week: 0.0 oz  . Drug use: No  . Sexual activity: No  Other Topics Concern    . Not on file  Social History Narrative   Has 4 brothers, 1 sister   Lives with 2 teenage grand kids and her daughter   Has 4 daughters, 1 son     Constitutional: Denies fever, malaise, fatigue, headache or abrupt weight changes.  HEENT: Pt reports wax buildup. Denies eye pain, eye redness, ear pain, ringing in the ears,  runny nose, nasal congestion, bloody nose, or sore throat. Respiratory: Denies difficulty breathing, shortness of breath, cough or sputum production.   Cardiovascular: Denies chest pain, chest tightness, palpitations or swelling in the hands or feet.  Gastrointestinal: Denies abdominal pain, bloating, constipation, diarrhea or blood in the stool.  GU: Denies urgency, frequency, pain with urination, burning sensation, blood in urine, odor or discharge.  No other specific complaints in a complete review of systems (except as listed in HPI above).  Objective:   Physical Exam   BP 124/84   Pulse 68   Temp (!) 97.4 F (36.3 C) (Oral)   Wt 166 lb 8 oz (75.5 kg)   SpO2 95%   BMI 32.52 kg/m  Wt Readings from Last 3 Encounters:  07/07/17 166 lb 8 oz (75.5 kg)  07/05/17 153 lb (69.4 kg)  05/16/17 153 lb 8 oz (69.6 kg)    General: Appears her stated age, in NAD. HEENT:  Ears: Bilateral cerumen impaction. Neck:  No adenopathy noted. Pulmonary/Chest: Normal effort and positive vesicular breath sounds. No respiratory distress. No wheezes, rales or ronchi noted.  Abdomen: Soft and nontender.   BMET    Component Value Date/Time   NA 140 07/05/2017 1220   K 4.0 07/05/2017 1220   CL 106 07/05/2017 1220   CO2 26 07/05/2017 1220   GLUCOSE 111 (H) 07/05/2017 1220   BUN 15 07/05/2017 1220   CREATININE 1.24 (H) 07/05/2017 1220   CALCIUM 10.3 07/05/2017 1220   GFRNONAA 38 (L) 07/05/2017 1220   GFRAA 45 (L) 07/05/2017 1220    Lipid Panel     Component Value Date/Time   CHOL 344 (H) 04/09/2017 1431   TRIG 375.0 (H) 04/09/2017 1431   HDL 51.80 04/09/2017 1431    CHOLHDL 7 04/09/2017 1431   VLDL 75.0 (H) 04/09/2017 1431   LDLCALC 199 (H) 01/03/2014 0949    CBC    Component Value Date/Time   WBC 5.4 07/05/2017 1220   RBC 4.48 07/05/2017 1220   HGB 13.5 07/05/2017 1220   HCT 42.5 07/05/2017 1220   PLT 199 07/05/2017 1220   MCV 94.9 07/05/2017 1220   MCH 30.1 07/05/2017 1220   MCHC  31.8 07/05/2017 1220   RDW 12.5 07/05/2017 1220   LYMPHSABS 1.4 05/08/2017 0753   MONOABS 0.5 05/08/2017 0753   EOSABS 0.1 05/08/2017 0753   BASOSABS 0.0 05/08/2017 0753    Hgb A1C Lab Results  Component Value Date   HGBA1C 5.4 03/07/2016           Assessment & Plan:   ER Follow Up for Confusion, Dehydration, Lactic Acidosis, Positive Blood Cultures:  ER notes, labs and imaging reviewed Discussed with family how usually 1/2 positive blood cultures usually means contamination Family insisting on redraw today, blood cultures ordered Exam benign No indication for any other repeat testing at this time  Cerumen Impaction, Bilateral:  Manual lavage by  CMA  Return precautions discussed, will follow up after blood cultures resulted Webb Silversmith, NP

## 2017-07-08 LAB — CULTURE, BLOOD (ROUTINE X 2): Special Requests: ADEQUATE

## 2017-07-09 ENCOUNTER — Telehealth: Payer: Self-pay | Admitting: *Deleted

## 2017-07-09 NOTE — Telephone Encounter (Addendum)
Called [employer], pt's daughter (on [place]) and LVM (ok per DPR) informing her that Shanee's cardiac monitoring test was normal and that the message does not require a call back but please call with any questions. Left office number if needed.    ----- Message from Melvenia Beam, MD sent at 07/08/2017  7:51 PM EST ----- Cardiac monitoring normal thanks

## 2017-07-10 LAB — CULTURE, BLOOD (ROUTINE X 2)
CULTURE: NO GROWTH
Special Requests: ADEQUATE

## 2017-07-15 LAB — CULTURE BLOOD MANUAL
MICRO NUMBER 7002: 90025410
Micro Number: 90025402
RESULT 123: NO GROWTH
RESULT 123: NO GROWTH
Specimen Quality: ADEQUATE
Specimen Quality: ADEQUATE

## 2017-07-23 ENCOUNTER — Ambulatory Visit (INDEPENDENT_AMBULATORY_CARE_PROVIDER_SITE_OTHER): Payer: Medicare Other | Admitting: Neurology

## 2017-07-23 ENCOUNTER — Encounter: Payer: Self-pay | Admitting: Neurology

## 2017-07-23 VITALS — BP 158/89 | HR 63 | Ht 60.0 in | Wt 166.0 lb

## 2017-07-23 DIAGNOSIS — F0390 Unspecified dementia without behavioral disturbance: Secondary | ICD-10-CM | POA: Diagnosis not present

## 2017-07-23 NOTE — Progress Notes (Signed)
[facility]    Provider:  Dr Jaynee Eagles Referring Provider: Abner Greenspan, MD Primary Care Physician:  Tower, Wynelle Fanny, MD  REASON FOR VISIT: follow up- dementia, episodes of alteration of awareness HISTORY FROM: patient  Interval history 07/23/2017: Memory is worsening, at night she sundowns. She sleeps more than she usually does, she can sleep 12 hours.  Mood is good. She is doing her exercises that PT showed her. She had a fall in November,  No alterations of awareness, she had a passing out spells in the ED and her metoprolol was reduced and no more issues since then. She woke up one day with slurred speech, went to the ED, no stroke, no UTI, symptoms resolved. No choking. She does not drink fluids, may have dehydration.  No hallucinations, behavioral issues. They have a baby monitor in her room. They have a bed alarm.   HISTORY OF PRESENT ILLNESS: [name] is an 84 year old female with a history of dementia and episodes of alteration of awareness. She returns today for follow-up. Her daughter is with her today and reports that she has not had any additional episodes of alteration of awareness. The patient currently lives with her daughter. She does require some assistance with ADLs. She no longer cooks meals. She does not operate a motor vehicle. Her daughter handles her finances. Denies any trouble sleeping. Her daughter reports that she has started biting her nails particularly the thumb and index finger. She  Completed physical therapy and daughter reports while they were there she did great. Family has a a hard time getting her to cooperate with him. Physical therapy suggested that she uses a walker but daughter reports that the patient refuses to use a walker and uses a cane instead. They deny any new neurological symptoms. Returns today for an evaluation.   06/06/16 Interval history: Routine and prolonged EEG were normal. Daughter provides most information. No more  episodes of alteration of awareness. She is having increased difficulty walking, transferring and performing her ADLsand worsening dementia. She is a fall risk, offered to send PT at her home due to her disability and inability to leave the home unassisted. She also needs nursing for teaching of disease process, blood pressure monitoring as it has been elevated and running high. Home inspection with evaluation of fall risks would also be helpful, she has fallen. She has dementia and worsening confusion.   Routine EEG: Normal 72 hour ambulatory [id number] was a normal prolonged ambulatory 72-hour video EEG. No epileptiform discharges seen. No electrographic or electroclinical events present. There was no focal or background slowing seen. There were no push button events. Owing to this prolonged VEEG being normal, other, non-epileptic causes should be considered for this patient's symptoms".  [id number] C [name] a 84 y.o.femalehere as a referral from Dr. Duanne Krueger stroke and seizure. Daughters are here and they provide most information. She has a past medical history of osteoporosis, hypertension, hyperlipidemia. She has had multiple episodes of altered awareness with confusion afterwards. Daughters and patient were at a beach in the shade. They noticed patient was getting pale looking. She leaned back, her eyes rolled back and she was pale and jerking.They thought she had a urinary tract infection. But then turns out she did not. She was not dehydrated. Patient does not remember the episode. She has dementia. Both arms jerked then she came to and they gave her some water.Episode lasted a few minutes. No inciting events or head trauma. No previous illnesses. She had  a similar episode on the porch a previous where her eyes rolled back and she jerked her arms.A fewdays beforehand she got up in the morning after having slept late and she didn;t recognize anyone. She was confused , took some time for  her to come back to baseline, paramedics came. This happened Thursday August 10th, she woke up she was not answering questions appropriately, she kept saying the same word. Memory changes started years ago and are slowly progressive. She gets up sometines at night and starts packing. Unknown triggers for these alterations of consciousness, no other associated symptoms, no urination or defecation. No inciting events or head trauma. No family history of seizures. Patient herself has no history of seizures as far as family and patient know.   Reviewed notes, labs and imaging from outside physicians, which showed:   Personally reviewed images of the brain and agree with following: MRI HEAD: No acute intracranial process.  New small LEFT cerebellar infarcts, otherwise stable examination including moderate chronic small vessel ischemic disease.  MRA HEAD: Motion degraded examination. 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 Possible tiny basilar tip aneurysm.  Poor flow related enhancement of the LEFT internal carotid artery, posterior cerebral arteries bilaterally, likely motion artifact.  Severe stenosis RIGHT M1 segment, moderate stenosis distal LEFT M1 segment though these may be overestimated by motion artifact.  As clinically indicated, findings would be better characterized on CTA HEAD.      Social History   Socioeconomic History  . Marital status: Widowed    Spouse name: Not on file  . Number of children: 5  . Years of education: 24  . Highest education level: Not on file  Social Needs  . Financial resource strain: Not on file  . Food insecurity - worry: Not on file  . Food insecurity - inability: Not on file  . Transportation needs - medical: Not on file  . Transportation needs - non-medical: Not on file  Occupational History  . Occupation: retired    Fish farm manager: RETIRED  Tobacco Use  . Smoking status: Never Smoker  . Smokeless  tobacco: Never Used  Substance and Sexual Activity  . Alcohol use: No    Alcohol/week: 0.0 oz  . Drug use: No  . Sexual activity: No  Other Topics Concern  . Not on file  Social History Narrative   Has 4 brothers, 1 sister   Lives with 2 adult grandchildren and her daughter   Has 4 daughters, 1 son   Right handed   Rare caffeine     Family History  Problem Relation Age of Onset  . Heart attack Father   . Hypertension Father   . Heart attack Mother   . Throat cancer Brother     Past Medical History:  Diagnosis Date  . Asymptomatic gallstones   . Colitis - presumed infectious origin    one ER visit   . Dementia   . Fall   . Hyperlipidemia   . Hypertension   . Osteoarthritis   . Osteoporosis   . Syncope     Past Surgical History:  Procedure Laterality Date  . ABDOMINAL HYSTERECTOMY     BSO- fibroids  . ABI's     normal  . dexa  1/05   osteoporosis  . ESOPHAGOGASTRODUODENOSCOPY N/A 03/21/2014   Procedure: ESOPHAGOGASTRODUODENOSCOPY (EGD);  Surgeon: Milus Banister, MD;  Location: [facility];  Service: Endoscopy;  Laterality: N/A;  . left foot brace    .  WRIST FRACTURE SURGERY  2/01   R arm    Current Outpatient Medications  Medication Sig Dispense Refill  . Cholecalciferol (VITAMIN D-3) 1000 units CAPS Take 1 capsule See admin instructions by mouth. Take 1000 units in the morning and 2000 in the evening    . clopidogrel (PLAVIX) 75 MG tablet TAKE 1 TABLET BY MOUTH EVERY DAY (Patient taking differently: TAKE 75 mg TABLET BY MOUTH EVERY DAY) 30 tablet 11  . donepezil (ARICEPT) 10 MG tablet TAKE 1 TABLET (10 MG TOTAL) BY MOUTH AT BEDTIME. 30 tablet 11  . Fexofenadine HCl (ALLERGY 24-HR PO) Take 1 capsule by mouth daily.    . hydrochlorothiazide (HYDRODIURIL) 25 MG tablet Take 1 tablet (25 mg total) by mouth daily. 90 tablet 3  . lisinopril (PRINIVIL,ZESTRIL) 20 MG tablet Take 1 tablet (20 mg total) by mouth daily. 90 tablet 3  . memantine (NAMENDA) 10 MG tablet  TAKE 1 TABLET (10 MG TOTAL) BY MOUTH 2 (TWO) TIMES DAILY. 60 tablet 11  . methocarbamol (ROBAXIN) 500 MG tablet Take 1 tablet (500 mg total) every 8 (eight) hours as needed by mouth for muscle spasms. 30 tablet 0  . metoprolol tartrate (LOPRESSOR) 25 MG tablet Take 1 tablet (25 mg total) by mouth 2 (two) times daily. 180 tablet 1  . Multiple Vitamins-Minerals ([employer] SPECTRAVITE PO) Take 1 capsule by mouth daily.    . potassium chloride (KLOR-CON M10) 10 MEQ tablet Take 2 tablets (20 mEq total) by mouth daily. (Patient taking differently: Take 20 mEq at bedtime by mouth. ) 180 tablet 3  . senna-docusate (SENOKOT-S) 8.6-50 MG tablet Take 1 tablet at bedtime as needed by mouth for mild constipation. 30 tablet 0  . vitamin B-12 (CYANOCOBALAMIN) 1000 MCG tablet Take 1,000 mcg by mouth daily.     No current facility-administered medications for this visit.     Allergies as of 07/23/2017 - Review Complete 07/23/2017  Allergen Reaction Noted  . Alendronate sodium Other (See Comments) 03/05/2011  . Amlodipine besylate Hives 03/16/2007  . Calcitonin (salmon) Other (See Comments) 07/23/2010  . Simvastatin Other (See Comments) 03/05/2011    Vitals: BP (!) 158/89 (BP Location: Right Arm, Patient Position: Sitting) Comment: not unusual per pt's daughter; md aware.  Pulse 63   Ht 5' (1.524 m)   Wt 166 lb (75.3 kg)   BMI 32.42 kg/m  Last Weight:  Wt Readings from Last 1 Encounters:  07/23/17 166 lb (75.3 kg)   Last Height:   Ht Readings from Last 1 Encounters:  07/23/17 5' (1.524 m)   MMSE - Mini Mental State Exam 04/09/2017 12/05/2016 12/05/2016  Not completed: (No Data) - -  Orientation to time - - 1  Orientation to Place - - 1  Registration - - 3  Attention/ Calculation - - 0  Recall - - 0  Language- name 2 objects - - 2  Language- repeat - - 0  Language- follow 3 step command - - 2  Language- read & follow direction - - 1  Write a sentence - 1 1  Copy design - 1 1  Total score - - 74    Assessment/Plan:84 year old female here for multiple complaints. Patient has a history of dementia, hypertension, hyperlipidemia. MRI of the brain showed intracranial large and small vessel atherosclerosis and new cerebellar infarct likely due to atherosclerosis. 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 Here with daughters who provide most information.  - Continue Plavix  -  Patient has a carotid terminus aneurysm 7 mm as compared to 6 mm 4 years ago. At a long discussion with patient and her daughters, I offered to refer them for cerebral angiogram and evaluation of stenting or coiling however even patient's age of 34 and her comorbidities including dementia and the risks of this procedure family decided to monitor and control blood pressure. Still in agreement today.  - Patient was having alterations of consciousness that would be seizures. I did recommend starting antiepileptic medications however family declines. EEG and prolonged EEG was without epileptiform activity. - Dementia: MMSE 13, moderate to advanced dementia. Discussed, provided information and where to go for Alzheimer's info such as Alzheimer's foundation online . Continue Aricept. Continue  memantine. Patient should not be driving anymore. She should have 24x7 caretaker.   - had home PT 05/2016 with home inspection for fall risk, can refer againin the future if needed     Sarina Ill, MD  [facility] [address] [place], [place] [address]  Phone [phone] Fax [phone]  A total of 15 minutes was spent in with this patient. Over half this time was spent on counseling patient on the dementia diagnosis and different therapeutic options available.

## 2017-08-15 ENCOUNTER — Encounter: Payer: Self-pay | Admitting: Family Medicine

## 2017-08-15 ENCOUNTER — Ambulatory Visit (INDEPENDENT_AMBULATORY_CARE_PROVIDER_SITE_OTHER): Payer: Medicare Other | Admitting: Family Medicine

## 2017-08-15 VITALS — BP 116/70 | HR 66 | Temp 97.6°F | Ht 60.0 in | Wt 168.8 lb

## 2017-08-15 DIAGNOSIS — I10 Essential (primary) hypertension: Secondary | ICD-10-CM

## 2017-08-15 DIAGNOSIS — F0150 Vascular dementia without behavioral disturbance: Secondary | ICD-10-CM | POA: Diagnosis not present

## 2017-08-15 DIAGNOSIS — Z9181 History of falling: Secondary | ICD-10-CM | POA: Diagnosis not present

## 2017-08-15 DIAGNOSIS — I720 Aneurysm of carotid artery: Secondary | ICD-10-CM | POA: Diagnosis not present

## 2017-08-15 NOTE — Assessment & Plan Note (Signed)
Reviewed recent neuro notes  No change in tx Has 24 h care from family /also bed alarm  Fall prec discussed

## 2017-08-15 NOTE — Patient Instructions (Addendum)
Keep encouraging her to drink fluids (mostly water)   Blood pressure is great / so is pulse   No medicine changes   Follow up in October for annual exam

## 2017-08-15 NOTE — Assessment & Plan Note (Signed)
Watched by neurology-note reviewed  No recent changes

## 2017-08-15 NOTE — Assessment & Plan Note (Signed)
bp in fair control at this time  BP Readings from Last 1 Encounters:  08/15/17 116/70   No changes needed Disc lifstyle change with low sodium diet and exercise  Doing well with lower beta blocker dose and pulse is in nl range No further falls  Disc cva risk-continue neuro f/u

## 2017-08-15 NOTE — Assessment & Plan Note (Signed)
Long disc re: fall risk and last fall Rugs are gone and house has been addressed Ramp made for ent/exit Pt now has 24 h care by family  Has bed alarm at night to prevent both wandering and falls as well  Continues to use walker full time Family continues to work on her exercises for balance and strength as well

## 2017-08-15 NOTE — Progress Notes (Signed)
Subjective:    Patient ID: Wendy Krueger, female    DOB: [birth year], 84 y.o.   MRN: [id number]  HPI Here for f/u of chronic health problems incl HTN  Feeling ok overall per pt   Daughter says she is "hard headed" and won't do her exercises (family is working on this)  Metoprolol was decreased to 25 mg bid in nove  bp may have been slt higher since then   Wt Readings from Last 3 Encounters:  08/15/17 168 lb 12.8 oz (76.6 kg)  07/23/17 166 lb (75.3 kg)  07/07/17 166 lb 8 oz (75.5 kg)   32.97 kg/m   bp is stable today  No cp or palpitations or headaches or edema  No side effects to medicines  BP Readings from Last 3 Encounters:  08/15/17 116/70  07/23/17 (!) 158/89  07/07/17 124/84     Pulse Readings from Last 3 Encounters:  08/15/17 66  07/23/17 63  07/07/17 68   Lab Results  Component Value Date   CREATININE 1.24 (H) 07/05/2017   BUN 15 07/05/2017   NA 140 07/05/2017   K 4.0 07/05/2017   CL 106 07/05/2017   CO2 26 07/05/2017   Lab Results  Component Value Date   ALT 12 (L) 07/05/2017   AST 22 07/05/2017   ALKPHOS 67 07/05/2017   BILITOT 0.5 07/05/2017    Lab Results  Component Value Date   WBC 5.4 07/05/2017   HGB 13.5 07/05/2017   HCT 42.5 07/05/2017   MCV 94.9 07/05/2017   PLT 199 07/05/2017    For dementia- has 24 h care Also a bed alarm to alert if she gets up  No falls since last ED visit  Doing everything they can to prevent falls  Had her neurology follow up (has 1 y before next visit)  Has a ramp to get in and out of the car   Family is assuring she gets three meals per day! This is much better  Goes to church on Sunday now  Uses walker full time now - very happy with that   Patient Active Problem List   Diagnosis Date Noted  . Fall 05/07/2017  . Cerebellar stroke ([place]) 03/10/2016  . Carotid aneurysm, right ([place]) 02/21/2016  . Lipoma of shoulder 03/27/2015  . History of closed Colles' fracture 09/06/2014  . Allergic rhinitis  06/20/2014  . Diastolic dysfunction 85/27/7824  . History of GI bleed 03/21/2014  . Dementia 09/01/2013  . Memory loss 09/01/2013  . Anxiety 09/01/2013  . Encounter for Medicare annual wellness exam 01/08/2013  . Gallstones 11/11/2011  . 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 Risk for falls 06/04/2011  . Constipation 03/05/2011  . Vitamin D deficiency 03/28/2009  . HYPERCHOLESTEROLEMIA, PURE 03/17/2007  . Essential hypertension 03/16/2007  . OSTEOARTHRITIS 03/16/2007  . Osteoporosis 03/16/2007   Past Medical History:  Diagnosis Date  . Asymptomatic gallstones   . Colitis - presumed infectious origin    one ER visit   . Dementia   . Fall   . Hyperlipidemia   . Hypertension   . Osteoarthritis   . Osteoporosis   . Syncope    Past Surgical History:  Procedure Laterality Date  . ABDOMINAL HYSTERECTOMY     BSO- fibroids  . ABI's     normal  . dexa  1/05   osteoporosis  . ESOPHAGOGASTRODUODENOSCOPY N/A 03/21/2014   Procedure: ESOPHAGOGASTRODUODENOSCOPY (EGD);  Surgeon: Milus Banister, MD;  Location: [facility];  Service: Endoscopy;  Laterality: N/A;  . left foot brace    . WRIST FRACTURE SURGERY  2/01   R arm   Social History   Tobacco Use  . Smoking status: Never Smoker  . Smokeless tobacco: Never Used  Substance Use Topics  . Alcohol use: No    Alcohol/week: 0.0 oz  . Drug use: No   Family History  Problem Relation Age of Onset  . Heart attack Father   . Hypertension Father   . Heart attack Mother   . Throat cancer Brother    Allergies  Allergen Reactions  . Alendronate Sodium Other (See Comments)    Leg pain   . Amlodipine Besylate Hives  . Calcitonin ([place]) Other (See Comments)     headache/ head pressure  . Simvastatin Other (See Comments)    Leg pain    Current Outpatient Medications on File Prior to Visit  Medication Sig Dispense Refill  . Cholecalciferol (VITAMIN D-3) 1000 units CAPS Take 1 capsule See admin instructions by mouth.  Take 1000 units in the morning and 2000 in the evening    . clopidogrel (PLAVIX) 75 MG tablet TAKE 1 TABLET BY MOUTH EVERY DAY (Patient taking differently: TAKE 75 mg TABLET BY MOUTH EVERY DAY) 30 tablet 11  . donepezil (ARICEPT) 10 MG tablet TAKE 1 TABLET (10 MG TOTAL) BY MOUTH AT BEDTIME. 30 tablet 11  . Fexofenadine HCl (ALLERGY 24-HR PO) Take 1 capsule by mouth daily.    . hydrochlorothiazide (HYDRODIURIL) 25 MG tablet Take 1 tablet (25 mg total) by mouth daily. 90 tablet 3  . lisinopril (PRINIVIL,ZESTRIL) 20 MG tablet Take 1 tablet (20 mg total) by mouth daily. 90 tablet 3  . memantine (NAMENDA) 10 MG tablet TAKE 1 TABLET (10 MG TOTAL) BY MOUTH 2 (TWO) TIMES DAILY. 60 tablet 11  . methocarbamol (ROBAXIN) 500 MG tablet Take 1 tablet (500 mg total) every 8 (eight) hours as needed by mouth for muscle spasms. 30 tablet 0  . metoprolol tartrate (LOPRESSOR) 25 MG tablet Take 1 tablet (25 mg total) by mouth 2 (two) times daily. 180 tablet 1  . Multiple Vitamins-Minerals ([employer]S SPECTRAVITE PO) Take 1 capsule by mouth daily.    . potassium chloride (KLOR-CON M10) 10 MEQ tablet Take 2 tablets (20 mEq total) by mouth daily. (Patient taking differently: Take 20 mEq at bedtime by mouth. ) 180 tablet 3  . senna-docusate (SENOKOT-S) 8.6-50 MG tablet Take 1 tablet at bedtime as needed by mouth for mild constipation. 30 tablet 0  . vitamin B-12 (CYANOCOBALAMIN) 1000 MCG tablet Take 1,000 mcg by mouth daily.     No current facility-administered medications on file prior to visit.     Review of Systems  Constitutional: Positive for fatigue. Negative for activity change, appetite change, fever and unexpected weight change.  HENT: Negative for congestion, ear pain, rhinorrhea, sinus pressure and sore throat.   Eyes: Negative for pain, redness and visual disturbance.  Respiratory: Negative for cough, shortness of breath and wheezing.   Cardiovascular: Negative for chest pain and palpitations.  Gastrointestinal:  Negative for abdominal pain, blood in stool, constipation and diarrhea.  Endocrine: Negative for polydipsia and polyuria.  Genitourinary: Negative for dysuria, frequency and urgency.  Musculoskeletal: Negative for arthralgias, back pain and myalgias.  Skin: Negative for pallor and rash.  Allergic/Immunologic: Negative for environmental allergies.  Neurological: Negative for dizziness, syncope and headaches.  Hematological: Negative for adenopathy. Does not bruise/bleed easily.  Psychiatric/Behavioral: Positive for decreased concentration. Negative for dysphoric mood. The  patient is not nervous/anxious.        Pos for dementia/poor short term memory  W/o beh disturbance  With lack of motivation       Objective:   Physical Exam  Constitutional: She appears well-developed and well-nourished. No distress.  obese and well appearing Sitting in chair  Uses walker to ambulate   HENT:  Head: Normocephalic and atraumatic.  Mouth/Throat: Oropharynx is clear and moist.  Eyes: Conjunctivae and EOM are normal. Pupils are equal, round, and reactive to light. Right eye exhibits no discharge. Left eye exhibits no discharge. No scleral icterus.  Neck: Normal range of motion. Neck supple. No JVD present. Carotid bruit is not present. No thyromegaly present.  Cardiovascular: Normal rate, regular rhythm, normal heart sounds and intact distal pulses. Exam reveals no gallop.  Pulmonary/Chest: Effort normal and breath sounds normal. No respiratory distress. She has no wheezes. She has no rales.  No crackles  Good air exch   Abdominal: Soft. Bowel sounds are normal. She exhibits no distension, no abdominal bruit and no mass. There is no tenderness.  Musculoskeletal: She exhibits no edema.  Mild kyphosis   Lymphadenopathy:    She has no cervical adenopathy.  Neurological: She is alert. She has normal reflexes. No cranial nerve deficit. She exhibits normal muscle tone. Coordination normal.  Skin: Skin is  warm and dry. No rash noted. No pallor.  Solar lentigines diffusely   Psychiatric: She has a normal mood and affect.  Cheerful  Baseline dementia-repeats herself  Seems content          Assessment & Plan:   Problem List Items Addressed This Visit      Cardiovascular and Mediastinum   Carotid aneurysm, right ([place])    Watched by neurology-note reviewed  No recent changes        Essential hypertension - Primary    bp in fair control at this time  BP Readings from Last 1 Encounters:  08/15/17 116/70   No changes needed Disc lifstyle change with low sodium diet and exercise  Doing well with lower beta blocker dose and pulse is in nl range No further falls  Disc cva risk-continue neuro f/u         Nervous and Auditory   Dementia    Reviewed recent neuro notes  No change in tx Has 24 h care from family /also bed alarm  Fall prec discussed         Other   Risk for falls    Long disc re: fall risk and last fall Rugs are gone and house has been addressed Ramp made for ent/exit Pt now has 24 h care by family  Has bed alarm at night to prevent both wandering and falls as well  Continues to use walker full time Family continues to work on her exercises for balance and strength as well

## 2017-10-13 ENCOUNTER — Other Ambulatory Visit: Payer: Self-pay | Admitting: Family Medicine

## 2017-10-13 DIAGNOSIS — Z1231 Encounter for screening mammogram for malignant neoplasm of breast: Secondary | ICD-10-CM

## 2017-11-17 ENCOUNTER — Ambulatory Visit
Admission: RE | Admit: 2017-11-17 | Discharge: 2017-11-17 | Disposition: A | Discharge status: Home / Self Care | Payer: Medicare Other | Source: Ambulatory Visit | Attending: Family Medicine | Admitting: Family Medicine

## 2017-11-17 DIAGNOSIS — Z1231 Encounter for screening mammogram for malignant neoplasm of breast: Secondary | ICD-10-CM

## 2017-12-22 ENCOUNTER — Other Ambulatory Visit: Payer: Self-pay | Admitting: *Deleted

## 2017-12-22 DIAGNOSIS — I10 Essential (primary) hypertension: Secondary | ICD-10-CM

## 2017-12-22 MED ORDER — METOPROLOL TARTRATE 25 MG PO TABS: 25.0000 mg | ORAL_TABLET | Freq: Two times a day (BID) | ORAL | 1 refills | Status: DC

## 2018-02-09 ENCOUNTER — Inpatient Hospital Stay (HOSPITAL_COMMUNITY)
Admission: EM | Admit: 2018-02-09 | Discharge: 2018-02-12 | DRG: 378 | Disposition: A | Discharge status: Home / Self Care | Payer: Medicare Other | Attending: Family Medicine | Admitting: Family Medicine

## 2018-02-09 ENCOUNTER — Other Ambulatory Visit: Payer: Self-pay

## 2018-02-09 ENCOUNTER — Encounter (HOSPITAL_COMMUNITY): Payer: Self-pay | Admitting: *Deleted

## 2018-02-09 DIAGNOSIS — Z8249 Family history of ischemic heart disease and other diseases of the circulatory system: Secondary | ICD-10-CM

## 2018-02-09 DIAGNOSIS — D696 Thrombocytopenia, unspecified: Secondary | ICD-10-CM | POA: Diagnosis present

## 2018-02-09 DIAGNOSIS — K625 Hemorrhage of anus and rectum: Secondary | ICD-10-CM | POA: Diagnosis present

## 2018-02-09 DIAGNOSIS — K648 Other hemorrhoids: Secondary | ICD-10-CM | POA: Diagnosis present

## 2018-02-09 DIAGNOSIS — Z888 Allergy status to other drugs, medicaments and biological substances status: Secondary | ICD-10-CM | POA: Diagnosis not present

## 2018-02-09 DIAGNOSIS — E7800 Pure hypercholesterolemia, unspecified: Secondary | ICD-10-CM | POA: Diagnosis present

## 2018-02-09 DIAGNOSIS — Z803 Family history of malignant neoplasm of breast: Secondary | ICD-10-CM

## 2018-02-09 DIAGNOSIS — K8020 Calculus of gallbladder without cholecystitis without obstruction: Secondary | ICD-10-CM | POA: Diagnosis present

## 2018-02-09 DIAGNOSIS — K644 Residual hemorrhoidal skin tags: Secondary | ICD-10-CM | POA: Diagnosis present

## 2018-02-09 DIAGNOSIS — E785 Hyperlipidemia, unspecified: Secondary | ICD-10-CM | POA: Diagnosis present

## 2018-02-09 DIAGNOSIS — I129 Hypertensive chronic kidney disease with stage 1 through stage 4 chronic kidney disease, or unspecified chronic kidney disease: Secondary | ICD-10-CM | POA: Diagnosis present

## 2018-02-09 DIAGNOSIS — Z8719 Personal history of other diseases of the digestive system: Secondary | ICD-10-CM

## 2018-02-09 DIAGNOSIS — N183 Chronic kidney disease, stage 3 (moderate): Secondary | ICD-10-CM | POA: Diagnosis present

## 2018-02-09 DIAGNOSIS — I5189 Other ill-defined heart diseases: Secondary | ICD-10-CM | POA: Diagnosis not present

## 2018-02-09 DIAGNOSIS — F0390 Unspecified dementia without behavioral disturbance: Secondary | ICD-10-CM | POA: Diagnosis present

## 2018-02-09 DIAGNOSIS — Z8673 Personal history of transient ischemic attack (TIA), and cerebral infarction without residual deficits: Secondary | ICD-10-CM | POA: Diagnosis not present

## 2018-02-09 DIAGNOSIS — Z7902 Long term (current) use of antithrombotics/antiplatelets: Secondary | ICD-10-CM | POA: Diagnosis not present

## 2018-02-09 DIAGNOSIS — K922 Gastrointestinal hemorrhage, unspecified: Secondary | ICD-10-CM | POA: Diagnosis not present

## 2018-02-09 DIAGNOSIS — F0150 Vascular dementia without behavioral disturbance: Secondary | ICD-10-CM | POA: Diagnosis not present

## 2018-02-09 DIAGNOSIS — Z808 Family history of malignant neoplasm of other organs or systems: Secondary | ICD-10-CM | POA: Diagnosis not present

## 2018-02-09 DIAGNOSIS — Z66 Do not resuscitate: Secondary | ICD-10-CM | POA: Diagnosis present

## 2018-02-09 DIAGNOSIS — I10 Essential (primary) hypertension: Secondary | ICD-10-CM | POA: Diagnosis present

## 2018-02-09 DIAGNOSIS — Z90710 Acquired absence of both cervix and uterus: Secondary | ICD-10-CM | POA: Diagnosis not present

## 2018-02-09 DIAGNOSIS — D649 Anemia, unspecified: Secondary | ICD-10-CM | POA: Diagnosis not present

## 2018-02-09 DIAGNOSIS — M810 Age-related osteoporosis without current pathological fracture: Secondary | ICD-10-CM | POA: Diagnosis present

## 2018-02-09 DIAGNOSIS — M1990 Unspecified osteoarthritis, unspecified site: Secondary | ICD-10-CM | POA: Diagnosis present

## 2018-02-09 DIAGNOSIS — I959 Hypotension, unspecified: Secondary | ICD-10-CM | POA: Diagnosis present

## 2018-02-09 DIAGNOSIS — N179 Acute kidney failure, unspecified: Secondary | ICD-10-CM | POA: Diagnosis present

## 2018-02-09 DIAGNOSIS — E876 Hypokalemia: Secondary | ICD-10-CM | POA: Diagnosis present

## 2018-02-09 DIAGNOSIS — R58 Hemorrhage, not elsewhere classified: Secondary | ICD-10-CM | POA: Diagnosis not present

## 2018-02-09 DIAGNOSIS — Z800 Family history of malignant neoplasm of digestive organs: Secondary | ICD-10-CM | POA: Diagnosis not present

## 2018-02-09 DIAGNOSIS — I639 Cerebral infarction, unspecified: Secondary | ICD-10-CM | POA: Diagnosis present

## 2018-02-09 DIAGNOSIS — R404 Transient alteration of awareness: Secondary | ICD-10-CM | POA: Diagnosis not present

## 2018-02-09 DIAGNOSIS — Z7901 Long term (current) use of anticoagulants: Secondary | ICD-10-CM | POA: Diagnosis not present

## 2018-02-09 DIAGNOSIS — K921 Melena: Principal | ICD-10-CM | POA: Diagnosis present

## 2018-02-09 DIAGNOSIS — R231 Pallor: Secondary | ICD-10-CM | POA: Diagnosis not present

## 2018-02-09 DIAGNOSIS — D62 Acute posthemorrhagic anemia: Secondary | ICD-10-CM | POA: Diagnosis present

## 2018-02-09 DIAGNOSIS — I447 Left bundle-branch block, unspecified: Secondary | ICD-10-CM | POA: Diagnosis not present

## 2018-02-09 HISTORY — DX: Cerebral infarction, unspecified: I63.9

## 2018-02-09 HISTORY — DX: Diaphragmatic hernia without obstruction or gangrene: K44.9

## 2018-02-09 HISTORY — DX: Gastrointestinal hemorrhage, unspecified: K92.2

## 2018-02-09 HISTORY — DX: Duodenitis without bleeding: K29.80

## 2018-02-09 HISTORY — DX: Calculus of gallbladder without cholecystitis without obstruction: K80.20

## 2018-02-09 HISTORY — DX: Gastritis, unspecified, without bleeding: K29.70

## 2018-02-09 HISTORY — DX: Hemorrhage of anus and rectum: K62.5

## 2018-02-09 LAB — TYPE AND SCREEN
ABO/RH(D): O POS
ANTIBODY SCREEN: NEGATIVE

## 2018-02-09 LAB — CBC WITH DIFFERENTIAL/PLATELET
Abs Immature Granulocytes: 0 10*3/uL (ref 0.0–0.1)
BASOS PCT: 1 %
Basophils Absolute: 0 10*3/uL (ref 0.0–0.1)
EOS PCT: 1 %
Eosinophils Absolute: 0.1 10*3/uL (ref 0.0–0.7)
HCT: 37.7 % (ref 36.0–46.0)
Hemoglobin: 11.9 g/dL — ABNORMAL LOW (ref 12.0–15.0)
Immature Granulocytes: 1 %
Lymphocytes Relative: 18 %
Lymphs Abs: 1.2 10*3/uL (ref 0.7–4.0)
MCH: 30.8 pg (ref 26.0–34.0)
MCHC: 31.6 g/dL (ref 30.0–36.0)
MCV: 97.7 fL (ref 78.0–100.0)
MONO ABS: 0.6 10*3/uL (ref 0.1–1.0)
Monocytes Relative: 8 %
Neutro Abs: 4.7 10*3/uL (ref 1.7–7.7)
Neutrophils Relative %: 71 %
PLATELETS: 148 10*3/uL — AB (ref 150–400)
RBC: 3.86 MIL/uL — ABNORMAL LOW (ref 3.87–5.11)
RDW: 12.8 % (ref 11.5–15.5)
WBC: 6.6 10*3/uL (ref 4.0–10.5)

## 2018-02-09 LAB — I-STAT CHEM 8, ED
BUN: 23 mg/dL (ref 8–23)
Calcium, Ion: 1.17 mmol/L (ref 1.15–1.40)
Chloride: 109 mmol/L (ref 98–111)
Creatinine, Ser: 1.2 mg/dL — ABNORMAL HIGH (ref 0.44–1.00)
Glucose, Bld: 128 mg/dL — ABNORMAL HIGH (ref 70–99)
HCT: 36 % (ref 36.0–46.0)
Hemoglobin: 12.2 g/dL (ref 12.0–15.0)
Potassium: 3.4 mmol/L — ABNORMAL LOW (ref 3.5–5.1)
SODIUM: 143 mmol/L (ref 135–145)
TCO2: 23 mmol/L (ref 22–32)

## 2018-02-09 LAB — CBC
HCT: 36.9 % (ref 36.0–46.0)
HCT: 34 % — ABNORMAL LOW (ref 36.0–46.0)
HEMOGLOBIN: 11.6 g/dL — AB (ref 12.0–15.0)
Hemoglobin: 10.9 g/dL — ABNORMAL LOW (ref 12.0–15.0)
MCH: 30.5 pg (ref 26.0–34.0)
MCH: 30.8 pg (ref 26.0–34.0)
MCHC: 31.4 g/dL (ref 30.0–36.0)
MCHC: 32.1 g/dL (ref 30.0–36.0)
MCV: 97.1 fL (ref 78.0–100.0)
MCV: 96 fL (ref 78.0–100.0)
PLATELETS: 135 10*3/uL — AB (ref 150–400)
Platelets: 149 10*3/uL — ABNORMAL LOW (ref 150–400)
RBC: 3.8 MIL/uL — ABNORMAL LOW (ref 3.87–5.11)
RBC: 3.54 MIL/uL — AB (ref 3.87–5.11)
RDW: 12.8 % (ref 11.5–15.5)
RDW: 12.7 % (ref 11.5–15.5)
WBC: 8.3 10*3/uL (ref 4.0–10.5)
WBC: 5.1 10*3/uL (ref 4.0–10.5)

## 2018-02-09 LAB — HEMOGLOBIN AND HEMATOCRIT, BLOOD
HEMATOCRIT: 34.2 % — AB (ref 36.0–46.0)
HEMOGLOBIN: 10.8 g/dL — AB (ref 12.0–15.0)

## 2018-02-09 LAB — COMPREHENSIVE METABOLIC PANEL
ALBUMIN: 3.5 g/dL (ref 3.5–5.0)
ALT: 11 U/L (ref 0–44)
ANION GAP: 11 (ref 5–15)
AST: 21 U/L (ref 15–41)
Alkaline Phosphatase: 50 U/L (ref 38–126)
BUN: 19 mg/dL (ref 8–23)
CO2: 22 mmol/L (ref 22–32)
Calcium: 9 mg/dL (ref 8.9–10.3)
Chloride: 110 mmol/L (ref 98–111)
Creatinine, Ser: 1.26 mg/dL — ABNORMAL HIGH (ref 0.44–1.00)
GFR calc Af Amer: 43 mL/min — ABNORMAL LOW (ref >60)
GFR calc non Af Amer: 37 mL/min — ABNORMAL LOW (ref >60)
GLUCOSE: 131 mg/dL — AB (ref 70–99)
POTASSIUM: 3.2 mmol/L — AB (ref 3.5–5.1)
SODIUM: 143 mmol/L (ref 135–145)
Total Bilirubin: 0.6 mg/dL (ref 0.3–1.2)
Total Protein: 5.9 g/dL — ABNORMAL LOW (ref 6.5–8.1)

## 2018-02-09 LAB — PROTIME-INR
INR: 1.08
PROTHROMBIN TIME: 13.9 s (ref 11.4–15.2)

## 2018-02-09 LAB — APTT: APTT: 26 s (ref 24–36)

## 2018-02-09 LAB — POC OCCULT BLOOD, ED: Fecal Occult Bld: POSITIVE — AB

## 2018-02-09 LAB — MAGNESIUM: Magnesium: 1.6 mg/dL — ABNORMAL LOW (ref 1.7–2.4)

## 2018-02-09 MED ORDER — SODIUM CHLORIDE 0.9 % IV SOLN
INTRAVENOUS | Status: DC
  Administered 2018-02-09 – 2018-02-10 (×3): via INTRAVENOUS

## 2018-02-09 MED ORDER — DONEPEZIL HCL 10 MG PO TABS
10.0000 mg | ORAL_TABLET | Freq: Every day | ORAL | Status: DC
  Administered 2018-02-10 – 2018-02-11 (×2): 10 mg via ORAL
  Filled 2018-02-09 (×2): qty 1

## 2018-02-09 MED ORDER — LORATADINE 10 MG PO TABS
10.0000 mg | ORAL_TABLET | Freq: Every day | ORAL | Status: DC
  Administered 2018-02-09 – 2018-02-12 (×4): 10 mg via ORAL
  Filled 2018-02-09 (×4): qty 1

## 2018-02-09 MED ORDER — PANTOPRAZOLE SODIUM 40 MG PO TBEC
40.0000 mg | DELAYED_RELEASE_TABLET | Freq: Every day | ORAL | Status: DC
  Administered 2018-02-10 – 2018-02-12 (×3): 40 mg via ORAL
  Filled 2018-02-09 (×3): qty 1

## 2018-02-09 MED ORDER — VITAMIN B-12 1000 MCG PO TABS
1000.0000 ug | ORAL_TABLET | Freq: Every day | ORAL | Status: DC
  Administered 2018-02-10 – 2018-02-12 (×3): 1000 ug via ORAL
  Filled 2018-02-09 (×3): qty 1

## 2018-02-09 MED ORDER — VITAMIN D-3 25 MCG (1000 UT) PO CAPS: 1.0000 | ORAL_CAPSULE | ORAL | Status: DC

## 2018-02-09 MED ORDER — ACETAMINOPHEN 325 MG PO TABS: 650.0000 mg | ORAL_TABLET | Freq: Four times a day (QID) | ORAL | Status: DC | PRN

## 2018-02-09 MED ORDER — ONDANSETRON HCL 4 MG/2ML IJ SOLN: 4.0000 mg | Freq: Three times a day (TID) | INTRAMUSCULAR | Status: DC | PRN

## 2018-02-09 MED ORDER — ZOLPIDEM TARTRATE 5 MG PO TABS: 5.0000 mg | ORAL_TABLET | Freq: Every evening | ORAL | Status: DC | PRN

## 2018-02-09 MED ORDER — VITAMIN D 1000 UNITS PO TABS
2000.0000 [IU] | ORAL_TABLET | Freq: Every day | ORAL | Status: DC
  Administered 2018-02-09 – 2018-02-11 (×3): 2000 [IU] via ORAL
  Filled 2018-02-09 (×3): qty 2

## 2018-02-09 MED ORDER — SODIUM CHLORIDE 0.9 % IV BOLUS
1000.0000 mL | Freq: Once | INTRAVENOUS | Status: AC
  Administered 2018-02-09: 1000 mL via INTRAVENOUS

## 2018-02-09 MED ORDER — MEMANTINE HCL 10 MG PO TABS
10.0000 mg | ORAL_TABLET | Freq: Two times a day (BID) | ORAL | Status: DC
  Administered 2018-02-10 – 2018-02-12 (×5): 10 mg via ORAL
  Filled 2018-02-09 (×5): qty 1

## 2018-02-09 MED ORDER — POTASSIUM CHLORIDE CRYS ER 20 MEQ PO TBCR
40.0000 meq | EXTENDED_RELEASE_TABLET | Freq: Once | ORAL | Status: AC
  Administered 2018-02-09: 40 meq via ORAL
  Filled 2018-02-09: qty 2

## 2018-02-09 MED ORDER — VITAMIN D 1000 UNITS PO TABS
1000.0000 [IU] | ORAL_TABLET | Freq: Every day | ORAL | Status: DC
  Administered 2018-02-09 – 2018-02-12 (×4): 1000 [IU] via ORAL
  Filled 2018-02-09 (×4): qty 1

## 2018-02-09 MED ORDER — POTASSIUM CHLORIDE CRYS ER 20 MEQ PO TBCR
20.0000 meq | EXTENDED_RELEASE_TABLET | Freq: Every day | ORAL | Status: DC
  Administered 2018-02-09 – 2018-02-11 (×3): 20 meq via ORAL
  Filled 2018-02-09 (×3): qty 1

## 2018-02-09 MED ORDER — PANTOPRAZOLE SODIUM 40 MG IV SOLR
40.0000 mg | Freq: Two times a day (BID) | INTRAVENOUS | Status: DC
Start: 2018-02-09 — End: 2018-02-09
  Administered 2018-02-09: 40 mg via INTRAVENOUS
  Filled 2018-02-09: qty 40

## 2018-02-09 NOTE — Care Management (Signed)
This is a no charge note  Pending admission per Dr. Randal Buba  84 year old lady with past medical history of hypertension, hyperlipidemia, stroke, colitis, dementia, left bundle blockage, GI bleeding.  Had EGD by Dr.Jacob on 03/21/2014 which showed gastritis and duodenitis.  Presenting with rectal bleeding.    Per ED physician, patient had rectal prolapse which is reduced.  FOBT positive.  Patient had hypotension with blood pressure 60/40, which improved to 112/60 after 500 cc fluid.  Hemoglobin dropped from 13.5 on 07/05/17-->12.2.  Give Protonix 40 mg twice daily. Patient is admitted to telemetry bed as inpatient.     Ivor Costa, MD  [facility] Pager [phone]  If 7PM-7AM, please contact night-coverage [email or web address] Password [facility] 02/09/2018, 6:03 AM

## 2018-02-09 NOTE — ED Notes (Signed)
Per family pt had diarrhea multiple times yesterday with slight rectal bleeding throughout the day, @ 0200 family states large bright red bleeding episodes. Pt pale on arrival. MD at bedside, pt assisted on L side, large protrusions noted from rectum, small clots noted, MD applied copious amounts of sugar to rectum with roll of kling

## 2018-02-09 NOTE — ED Triage Notes (Signed)
Pt from home c/o rectal bleeding onset at 2am. Pt reported having diarrhea yesterday with blood only when wiping.Tonight started having significant amounts of bright red bleeding with clots. Blood pressure 110/40 that dropped to 60/40. Pt received 552ml NS enroute. Hx of dementia, is taking plavix

## 2018-02-09 NOTE — ED Provider Notes (Signed)
[facility] EMERGENCY DEPARTMENT Provider Note   CSN: [id number] Arrival date & time: 02/09/18  0410     History   Chief Complaint Chief Complaint  Patient presents with  . Rectal Bleeding    HPI Wendy Krueger is a 84 y.o. female.  The history is provided by the EMS personnel. No language interpreter was used.  Rectal Bleeding  Quality:  Bright red and maroon Amount:  Copious Duration:  1 day Timing:  Constant Chronicity:  Recurrent Context: diarrhea, hemorrhoids and spontaneously   Similar prior episodes: no   Relieved by:  Nothing Worsened by:  Nothing Ineffective treatments:  None tried Associated symptoms: no abdominal pain and no fever   Risk factors: anticoagulant use   patient on Plavix with a h/o GIB presents with 1 day of diarrhea and then spontaneously BRBPR and clots.    Past Medical History:  Diagnosis Date  . Asymptomatic gallstones   . Colitis - presumed infectious origin    one ER visit   . Dementia   . Fall   . Hyperlipidemia   . Hypertension   . Osteoarthritis   . Osteoporosis   . Syncope     Patient Active Problem List   Diagnosis Date Noted  . Rectal bleeding 02/09/2018  . Hypokalemia 02/09/2018  . Hypotension 02/09/2018  . Fall 05/07/2017  . Cerebellar stroke ([place]) 03/10/2016  . Carotid aneurysm, right ([place]) 02/21/2016  . Lipoma of shoulder 03/27/2015  . History of closed Colles' fracture 09/06/2014  . Allergic rhinitis 06/20/2014  . Diastolic dysfunction 97/67/3419  . History of GI bleed 03/21/2014  . Dementia 09/01/2013  . Memory loss 09/01/2013  . Anxiety 09/01/2013  . Encounter for Medicare annual wellness exam 01/08/2013  . Gallstones 11/11/2011  . 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 Risk for falls 06/04/2011  . Constipation 03/05/2011  . Vitamin D deficiency 03/28/2009  . HYPERCHOLESTEROLEMIA, PURE 03/17/2007  . Essential hypertension 03/16/2007  . OSTEOARTHRITIS 03/16/2007  .  Osteoporosis 03/16/2007    Past Surgical History:  Procedure Laterality Date  . ABDOMINAL HYSTERECTOMY     BSO- fibroids  . ABI's     normal  . dexa  1/05   osteoporosis  . ESOPHAGOGASTRODUODENOSCOPY N/A 03/21/2014   Procedure: ESOPHAGOGASTRODUODENOSCOPY (EGD);  Surgeon: Milus Banister, MD;  Location: [facility];  Service: Endoscopy;  Laterality: N/A;  . left foot brace    . WRIST FRACTURE SURGERY  2/01   R arm     OB History   None      Home Medications    Prior to Admission medications   Medication Sig Start Date End Date Taking? Authorizing Provider  Cholecalciferol (VITAMIN D-3) 1000 units CAPS Take 1 capsule See admin instructions by mouth. Take 1000 units in the morning and 2000 in the evening   Yes [provider]  donepezil (ARICEPT) 10 MG tablet TAKE 1 TABLET (10 MG TOTAL) BY MOUTH AT BEDTIME. 06/25/17  Yes Melvenia Beam, MD  Fexofenadine HCl (ALLERGY 24-HR PO) Take 1 capsule by mouth daily.   Yes [provider]  hydrochlorothiazide (HYDRODIURIL) 25 MG tablet Take 1 tablet (25 mg total) by mouth daily. 04/16/17  Yes Tower, Wynelle Fanny, MD  lisinopril (PRINIVIL,ZESTRIL) 20 MG tablet Take 1 tablet (20 mg total) by mouth daily. 04/16/17  Yes Tower, Wynelle Fanny, MD  loperamide (IMODIUM A-D) 2 MG tablet Take 2 mg by mouth 4 (four) times daily as needed for diarrhea or loose stools.  Yes [provider]  memantine (NAMENDA) 10 MG tablet TAKE 1 TABLET (10 MG TOTAL) BY MOUTH 2 (TWO) TIMES DAILY. 06/25/17  Yes Melvenia Beam, MD  metoprolol tartrate (LOPRESSOR) 25 MG tablet Take 1 tablet (25 mg total) by mouth 2 (two) times daily. 12/22/17  Yes Tower, Wynelle Fanny, MD  Multiple Vitamins-Minerals ([employer] SPECTRAVITE PO) Take 1 capsule by mouth daily.   Yes [provider]  potassium chloride (KLOR-CON M10) 10 MEQ tablet Take 2 tablets (20 mEq total) by mouth daily. Patient taking differently: Take 20 mEq at bedtime by mouth.  04/16/17  Yes Tower, Wynelle Fanny, MD  vitamin B-12 (CYANOCOBALAMIN) 1000 MCG tablet Take 1,000 mcg by mouth daily.   Yes [provider]  clopidogrel (PLAVIX) 75 MG tablet TAKE 1 TABLET BY MOUTH EVERY DAY Patient not taking: Reported on 02/09/2018 02/27/17   Ward Givens, NP  methocarbamol (ROBAXIN) 500 MG tablet Take 1 tablet (500 mg total) every 8 (eight) hours as needed by mouth for muscle spasms. Patient not taking: Reported on 02/09/2018 05/09/17   Lavina Hamman, MD  senna-docusate (SENOKOT-S) 8.6-50 MG tablet Take 1 tablet at bedtime as needed by mouth for mild constipation. Patient not taking: Reported on 02/09/2018 05/09/17   Lavina Hamman, MD    Family History Family History  Problem Relation Age of Onset  . Heart attack Father   . Hypertension Father   . Heart attack Mother   . Throat cancer Brother   . Breast cancer Daughter     Social History Social History   Tobacco Use  . Smoking status: Never Smoker  . Smokeless tobacco: Never Used  Substance Use Topics  . Alcohol use: No    Alcohol/week: 0.0 standard drinks  . Drug use: No     Allergies   Alendronate sodium; Amlodipine besylate; Calcitonin (salmon); and Simvastatin   Review of Systems Review of Systems  Constitutional: Negative for fever.  Respiratory: Negative for shortness of breath.   Cardiovascular: Negative for chest pain.  Gastrointestinal: Positive for anal bleeding, blood in stool, diarrhea and hematochezia. Negative for abdominal pain.  All other systems reviewed and are negative.    Physical Exam Updated Vital Signs BP 112/65   Pulse 65   Temp 98.1 F (36.7 C)   Resp 14   SpO2 100%   Physical Exam  Constitutional: She is oriented to person, place, and time. She appears well-developed and well-nourished.  HENT:  Head: Normocephalic and atraumatic.  Mouth/Throat: No oropharyngeal exudate.  Eyes: Pupils are equal, round, and reactive to light. Conjunctivae are normal.  Neck: Normal range of motion. Neck  supple.  Cardiovascular: Normal rate, regular rhythm, normal heart sounds and intact distal pulses.  Pulmonary/Chest: Effort normal and breath sounds normal. No stridor. She has no wheezes. She has no rales.  Abdominal: Soft. Bowel sounds are normal. She exhibits no mass. There is no tenderness. There is no rebound and no guarding.  Genitourinary: Rectal exam shows guaiac positive stool.  Musculoskeletal: Normal range of motion.  Neurological: She is alert and oriented to person, place, and time.  Skin: Skin is warm and dry. Capillary refill takes less than 2 seconds. She is not diaphoretic.  Psychiatric: She has a normal mood and affect.     ED Treatments / Results  Labs (all labs ordered are listed, but only abnormal results are displayed) Results for orders placed or performed during the hospital encounter of 02/09/18  CBC with Differential/Platelet  Result Value  Ref Range   WBC 6.6 4.0 - 10.5 K/uL   RBC 3.86 (L) 3.87 - 5.11 MIL/uL   Hemoglobin 11.9 (L) 12.0 - 15.0 g/dL   HCT 37.7 36.0 - 46.0 %   MCV 97.7 78.0 - 100.0 fL   MCH 30.8 26.0 - 34.0 pg   MCHC 31.6 30.0 - 36.0 g/dL   RDW 12.8 11.5 - 15.5 %   Platelets 148 (L) 150 - 400 K/uL   Neutrophils Relative % 71 %   Neutro Abs 4.7 1.7 - 7.7 K/uL   Lymphocytes Relative 18 %   Lymphs Abs 1.2 0.7 - 4.0 K/uL   Monocytes Relative 8 %   Monocytes Absolute 0.6 0.1 - 1.0 K/uL   Eosinophils Relative 1 %   Eosinophils Absolute 0.1 0.0 - 0.7 K/uL   Basophils Relative 1 %   Basophils Absolute 0.0 0.0 - 0.1 K/uL   Immature Granulocytes 1 %   Abs Immature Granulocytes 0.0 0.0 - 0.1 K/uL  Comprehensive metabolic panel  Result Value Ref Range   Sodium 143 135 - 145 mmol/L   Potassium 3.2 (L) 3.5 - 5.1 mmol/L   Chloride 110 98 - 111 mmol/L   CO2 22 22 - 32 mmol/L   Glucose, Bld 131 (H) 70 - 99 mg/dL   BUN 19 8 - 23 mg/dL   Creatinine, Ser 1.26 (H) 0.44 - 1.00 mg/dL   Calcium 9.0 8.9 - 10.3 mg/dL   Total Protein 5.9 (L) 6.5 - 8.1  g/dL   Albumin 3.5 3.5 - 5.0 g/dL   AST 21 15 - 41 U/L   ALT 11 0 - 44 U/L   Alkaline Phosphatase 50 38 - 126 U/L   Total Bilirubin 0.6 0.3 - 1.2 mg/dL   GFR calc non Af Amer 37 (L) >60 mL/min   GFR calc Af Amer 43 (L) >60 mL/min   Anion gap 11 5 - 15  I-Stat Chem 8, ED  Result Value Ref Range   Sodium 143 135 - 145 mmol/L   Potassium 3.4 (L) 3.5 - 5.1 mmol/L   Chloride 109 98 - 111 mmol/L   BUN 23 8 - 23 mg/dL   Creatinine, Ser 1.20 (H) 0.44 - 1.00 mg/dL   Glucose, Bld 128 (H) 70 - 99 mg/dL   Calcium, Ion 1.17 1.15 - 1.40 mmol/L   TCO2 23 22 - 32 mmol/L   Hemoglobin 12.2 12.0 - 15.0 g/dL   HCT 36.0 36.0 - 46.0 %  POC occult blood, ED  Result Value Ref Range   Fecal Occult Bld POSITIVE (A) NEGATIVE  Type and screen  Result Value Ref Range   ABO/RH(D) O POS    Antibody Screen NEG    Sample Expiration      02/12/2018 Performed at [facility], [address] [place], [place] [address]    No results found.  EKG  EKG Interpretation  Date/Time:  Monday February 09 2018 04:17:20 EDT Ventricular Rate:  67 PR Interval:    QRS Duration: 142 QT Interval:  458 QTC Calculation: 484 R Axis:   0 Text Interpretation:  Sinus rhythm Left bundle branch block Confirmed by Cymone Yeske ([id number]) on 02/09/2018 6:01:05 AM       Radiology No results found.  Procedures Procedures (including critical care time)    Final Clinical Impressions(s) / ED Diagnoses   Final diagnoses:  Lower GI bleed    Will admit for GIB,     Ramell Wacha, MD 02/09/18 [id number]

## 2018-02-09 NOTE — ED Notes (Signed)
Pt rolled with provider, excess sugar cleaned from rectal area, protrusions considerably smaller at this time, minimal bleeding noted only with dabbing with washcloth. Pt tolerated well

## 2018-02-09 NOTE — H&P (Addendum)
History and Physical    Wendy Krueger IZT:[id number] DOB: 06-Oct-1929 DOA: 02/09/2018  PCP: Abner Greenspan, MD Patient coming from: home  Chief Complaint: BRBPR  HPI: Wendy Krueger is a very pleasant somewhat demented 83 y.o. female with medical history significant for stroke taking plavix, hypertension, duodenitis, gastritis,osteoporosis, hyperlipidemia, cholelithiasis presents to the emergency department chief complaint bright red blood per rectum. [facility] are asked to admit  Information is obtained from the patient and her daughters who are the primary caregiver noting that information from patient may be unreliable due to dementia. Daughter states patient has been complaining of feeling "weak and tired over the last day or so. Yesterday she didn't go to church because she was feeling weak. Daughter also reports decreased appetite as patient complained of nausea but there was no vomiting. She also had several episodes of loose stool. There was slight blood noted on toilet paper at that time. Daughter reports around 2 AM had a large bowel movement with mostly bright red blood and clots. EMS was called. Reportedly blood pressure 60/40. Daughter reports she was pale and "sweaty". Patient denies any abdominal pain bloating shortness of breath dizziness. No reports of chest pain palpitations lower extremity edema. No fever chills dysuria hematuria frequency or urgency. No change in medications and patient is compliant with Plavix.    ED Course: in the emergency department she's afebrile with a blood pressure low end of normal. She is not hypoxic. She is provided with 1 L normal saline, protonic IV and sugar per rectum  Review of Systems: As per HPI otherwise all other systems reviewed and are negative.   Ambulatory Status: patient lives at home with her daughter. She uses a walker to get about. No recent falls  Past Medical History:  Diagnosis Date  . Asymptomatic gallstones     . BRBPR (bright red blood per rectum)   . Cholelithiasis   . Colitis - presumed infectious origin    one ER visit   . Dementia   . Duodenitis   . Fall   . Gastritis   . GI bleed   . Hiatal hernia   . Hyperlipidemia   . Hypertension   . Osteoarthritis   . Osteoporosis   . Stroke ([place])   . Syncope     Past Surgical History:  Procedure Laterality Date  . ABDOMINAL HYSTERECTOMY     BSO- fibroids  . ABI's     normal  . dexa  1/05   osteoporosis  . ESOPHAGOGASTRODUODENOSCOPY N/A 03/21/2014   Procedure: ESOPHAGOGASTRODUODENOSCOPY (EGD);  Surgeon: Milus Banister, MD;  Location: [facility];  Service: Endoscopy;  Laterality: N/A;  . left foot brace    . WRIST FRACTURE SURGERY  2/01   R arm    Social History   Socioeconomic History  . Marital status: Widowed    Spouse name: Not on file  . Number of children: 5  . Years of education: 89  . Highest education level: Not on file  Occupational History  . Occupation: retired    Fish farm manager: RETIRED  Social Needs  . Financial resource strain: Not on file  . Food insecurity:    Worry: Not on file    Inability: Not on file  . Transportation needs:    Medical: Not on file    Non-medical: Not on file  Tobacco Use  . Smoking status: Never Smoker  . Smokeless tobacco: Never Used  Substance and Sexual Activity  . Alcohol use: No  Alcohol/week: 0.0 standard drinks  . Drug use: No  . Sexual activity: Never  Lifestyle  . Physical activity:    Days per week: Not on file    Minutes per session: Not on file  . Stress: Not on file  Relationships  . Social connections:    Talks on phone: Not on file    Gets together: Not on file    Attends religious service: Not on file    Active member of club or organization: Not on file    Attends meetings of clubs or organizations: Not on file    Relationship status: Not on file  . Intimate partner violence:    Fear of current or ex partner: Not on file    Emotionally abused: Not on  file    Physically abused: Not on file    Forced sexual activity: Not on file  Other Topics Concern  . Not on file  Social History Narrative   Has 4 brothers, 1 sister   Lives with 2 adult grandchildren and her daughter   Has 4 daughters, 1 son   Right handed   Rare caffeine     Allergies  Allergen Reactions  . Alendronate Sodium Other (See Comments)    Leg pain   . Amlodipine Besylate Hives  . Calcitonin ([place]) Other (See Comments)     headache/ head pressure  . Simvastatin Other (See Comments)    Leg pain     Family History  Problem Relation Age of Onset  . Heart attack Father   . Hypertension Father   . Heart attack Mother   . Throat cancer Brother   . Breast cancer Daughter     Prior to Admission medications   Medication Sig Start Date End Date Taking? Authorizing Provider  Cholecalciferol (VITAMIN D-3) 1000 units CAPS Take 1 capsule See admin instructions by mouth. Take 1000 units in the morning and 2000 in the evening   Yes [provider]  clopidogrel (PLAVIX) 75 MG tablet TAKE 1 TABLET BY MOUTH EVERY DAY 02/27/17  Yes Ward Givens, NP  donepezil (ARICEPT) 10 MG tablet TAKE 1 TABLET (10 MG TOTAL) BY MOUTH AT BEDTIME. 06/25/17  Yes Melvenia Beam, MD  Fexofenadine HCl (ALLERGY 24-HR PO) Take 1 capsule by mouth daily.   Yes [provider]  hydrochlorothiazide (HYDRODIURIL) 25 MG tablet Take 1 tablet (25 mg total) by mouth daily. 04/16/17  Yes Tower, Wynelle Fanny, MD  lisinopril (PRINIVIL,ZESTRIL) 20 MG tablet Take 1 tablet (20 mg total) by mouth daily. 04/16/17  Yes Tower, Wynelle Fanny, MD  loperamide (IMODIUM A-D) 2 MG tablet Take 2 mg by mouth 4 (four) times daily as needed for diarrhea or loose stools.   Yes [provider]  memantine (NAMENDA) 10 MG tablet TAKE 1 TABLET (10 MG TOTAL) BY MOUTH 2 (TWO) TIMES DAILY. 06/25/17  Yes Melvenia Beam, MD  metoprolol tartrate (LOPRESSOR) 25 MG tablet Take 1 tablet (25 mg total) by mouth 2 (two)  times daily. 12/22/17  Yes Tower, Wynelle Fanny, MD  Multiple Vitamins-Minerals ([employer]S SPECTRAVITE PO) Take 1 capsule by mouth daily.   Yes [provider]  potassium chloride (KLOR-CON M10) 10 MEQ tablet Take 2 tablets (20 mEq total) by mouth daily. Patient taking differently: Take 20 mEq at bedtime by mouth.  04/16/17  Yes Tower, Wynelle Fanny, MD  vitamin B-12 (CYANOCOBALAMIN) 1000 MCG tablet Take 1,000 mcg by mouth daily.   Yes [provider]    Physical Exam:  Vitals:   02/09/18 0600 02/09/18 0615 02/09/18 0630 02/09/18 0700  BP: 111/80 129/77 (!) 150/65 140/64  Pulse: (!) 56 (!) 58 60 62  Resp: 14 13 17 19   Temp:      SpO2: 99% 96% 100% 100%     General:  Appears calm and comfortable slightly pale in no acute distress Eyes:  PERRL, EOMI, normal lids, iris ENT:  grossly normal hearing, lips & tongue, mmm Neck:  no LAD, masses or thyromegaly Cardiovascular:  RRR, no m/r/g. No LE edema.  Respiratory:  CTA bilaterally, no w/r/r. Normal respiratory effort. Abdomen:  soft, obese very sluggish bowel sounds mild tenderness to palpation in her lower right quadrant. No guarding or rebounding Skin:  no rash or induration seen on limited exam, slightly pale warm and dry Musculoskeletal:  grossly normal tone BUE/BLE, good ROM, no bony abnormality Psychiatric:  grossly normal mood and affect, speech fluent and appropriate, AOx3 Neurologic:  Oriented to person and place. Speech clear facial symmetry able to follow simple commands. Able to make her wants and needs known  Labs on Admission: I have personally reviewed following labs and imaging studies  CBC: Recent Labs  Lab 02/09/18 0428 02/09/18 0434  WBC 6.6  --   NEUTROABS 4.7  --   HGB 11.9* 12.2  HCT 37.7 36.0  MCV 97.7  --   PLT 148*  --    Basic Metabolic Panel: Recent Labs  Lab 02/09/18 0428 02/09/18 0434  NA 143 143  K 3.2* 3.4*  CL 110 109  CO2 22  --   GLUCOSE 131* 128*  BUN 19 23  CREATININE 1.26* 1.20*    CALCIUM 9.0  --    GFR: CrCl cannot be calculated (Unknown ideal weight.). Liver Function Tests: Recent Labs  Lab 02/09/18 0428  AST 21  ALT 11  ALKPHOS 50  BILITOT 0.6  PROT 5.9*  ALBUMIN 3.5   No results for input(s): LIPASE, AMYLASE in the last 168 hours. No results for input(s): AMMONIA in the last 168 hours. Coagulation Profile: No results for input(s): INR, PROTIME in the last 168 hours. Cardiac Enzymes: No results for input(s): CKTOTAL, CKMB, CKMBINDEX, TROPONINI in the last 168 hours. BNP (last 3 results) No results for input(s): PROBNP in the last 8760 hours. HbA1C: No results for input(s): HGBA1C in the last 72 hours. CBG: No results for input(s): GLUCAP in the last 168 hours. Lipid Profile: No results for input(s): CHOL, HDL, LDLCALC, TRIG, CHOLHDL, LDLDIRECT in the last 72 hours. Thyroid Function Tests: No results for input(s): TSH, T4TOTAL, FREET4, T3FREE, THYROIDAB in the last 72 hours. Anemia Panel: No results for input(s): VITAMINB12, FOLATE, FERRITIN, TIBC, IRON, RETICCTPCT in the last 72 hours. Urine analysis:    Component Value Date/Time   COLORURINE YELLOW 07/05/2017 [place] 07/05/2017 1221   LABSPEC 1.010 07/05/2017 1221   PHURINE 7.0 07/05/2017 1221   GLUCOSEU NEGATIVE 07/05/2017 1221   HGBUR NEGATIVE 07/05/2017 1221   BILIRUBINUR NEGATIVE 07/05/2017 1221   BILIRUBINUR negative 02/21/2017 1628   KETONESUR NEGATIVE 07/05/2017 1221   PROTEINUR NEGATIVE 07/05/2017 1221   UROBILINOGEN 0.2 03/20/2014 0753   NITRITE NEGATIVE 07/05/2017 1221   LEUKOCYTESUR NEGATIVE 07/05/2017 1221    Creatinine Clearance: CrCl cannot be calculated (Unknown ideal weight.).  Sepsis Labs: @LABRCNTIP (procalcitonin:4,lacticidven:4) )No results found for this or any previous visit (from the past 240 hour(s)).   Radiological Exams on Admission: No results found.  EKG: Independently reviewed. Sinus rhythm Left bundle branch  block  Assessment/Plan Principal Problem:   Rectal bleeding Active Problems:   Hypotension   GI bleed   Essential hypertension   Diastolic dysfunction   Hypokalemia   Acute kidney injury ([place])   Dementia   Cerebellar stroke ([place])   Acute blood loss anemia   #1. Rectal bleeding/GI bleed. Patient does have a history of hemorrhoids as well. FOBT positive. EGD 4 years ago revealed gastritis duodenitis. Hemoglobin drifting down slightly but remains greater than 12. She is provided with IV fluids and IV Protonix in the emergency department -Admit to telemetry -Nothing by mouth except sips with meds -Hold Plavix -Hold antihypertensive meds -Continue protonic's -Serial CBCs -follow PT/INR -We'll request a GI consult  #2. Hypotension. Patient with a history of hypertension. Reportedly her blood pressure was 60/40 per EMS. Likely related to volume. She was given 500 mL of normal saline systolic came up to 324. He was given another liter of fluids in the emergency department. At the time of admission blood pressure 140/64. Home medications include lisinopril and Lopressor -hold antihypertensive medications -monitor blood pressure closely -Continue IV fluids  #3.acute blood loss anemia. Related to #1. 8 months ago hemoglobin 13.5. At time of admission hemoglobin 11.9. FOBT positive No further bleeding in the emergency department. -serial CBCs -Hold Plavix -Request GI consult  #4. Acute kidney injury. Mild. Likely related to #1 in the setting of lisinopril. Creatinine 1.2. -Hold nephrotoxins -Monitor urine output -Gentle IV fluids -Recheck in the morning  #5. Hypokalemia. History of same. Home medications include potassium supplement. -We'll provide extra dose today of K-dur -obtain magnesium level -recheck in the morning  #6. Dementia. Patient is at baseline at time of admission according to family. Family states "she forgets our name usually recognizes [place] as her daughters." she is  able to make her wants and needs known. Home medications include Namenda and Aricept -Hold home meds for now  #7. History of stroke. Home medications include Plavix -Hold Plavix -Physical therapy  #8. Diastolic dysfunction. Echo 05/2017 reveals moderate concentric hypertrophy with normal wall motion and grade 1 diastolic dysfunction. Does not appear volume overloaded. ekg as noted above -monitor intake and output -daily weights -home meds as noted above  DVT prophylaxis: scd  Code Status: dnr  Family Communication: daughters at bedside  Disposition Plan: home  Consults called:  [facility] Admission status: inatient    Radene Gunning MD [facility]  If 7PM-7AM, please contact night-coverage [email or web address] Password [facility]  02/09/2018, 7:19 AM

## 2018-02-09 NOTE — Plan of Care (Signed)

## 2018-02-09 NOTE — Consult Note (Addendum)
[facility] Consult: 10:11 AM 02/09/2018  LOS: 0 days    Referring Provider: Dr Lorin Mercy  Primary Care Physician:  Tower, Wynelle Fanny, MD Primary Gastroenterologist:  Dr. Ardis Hughs.       Reason for Consultation: Painless hematochezia   HPI: Wendy Krueger is a 84 y.o. female.  PMH CVA, on chronic Plavix.  Stage 3a CKD.  Mild dementia.   Colitis 2013, infectious cause suspected.  C diff + stool in 03/2014, Rx Flagyl.   Hematochezia and black stools, transfusion requiring anemia eval in 03/2014.  On unopposed ASA then.   03/2014 EGD.  Gastritis, bulbar duodentitis.  H Pylori negative.  Treated with PPI and po iron.   Inflamed hemorrhoids treated with HC suppositories in 03/2014.  Has never had Colonoscopy. Has not been using PPI or oral iron for at least one year.  No use of aspirin or NSAIDs.  Stable appetite, weight.  Chronic minor bleeding per rectum from large internal and external hemorrhoids.  Periodic bouts of loose stool, resolves within a day, sometimes requires Imodium for treatment.  Periodic straining but rare constipation. ~ 2 d hx of fatigue, weakness.  + nausea, no emesis.   On Sunday passed several loose stools, scant blood seen at wiping as is her usual.  Her daughter gave her a single dose of Imodium later in the day.  2 AM Monday: large, clotted episodes hematochezia 2 or 3 in quick succession.  EMS called, BP 60/40.  + diaphoresis, + pallor.  No abdominal pain.  No nausea although this morning she did experience nausea without emesis. Last dose of Plavix was Sunday 8/11.   Hgb 11. 6, c/w priors late 2018.  Lactic acid elevated at 2.0.  Treated with 1 liter NS.   No elevation BUN, slight elevation creatinine.  FOBT +.   Coags normal.   She has had no further episodes of hematochezia and feels pretty  well this morning.  Family history significant for colon cancer in 2 of her younger brothers when they were in their 48s.  One brother diagnosed at late stage and died with colon cancer.  The other has undergone surgery and so far has not had any recurrence. Again patient herself has never undergone colonoscopy.  Past Medical History:  Diagnosis Date  . Asymptomatic gallstones   . BRBPR (bright red blood per rectum)   . Cholelithiasis   . Colitis - presumed infectious origin    one ER visit   . Dementia   . Duodenitis   . Fall   . Gastritis   . GI bleed   . Hiatal hernia   . Hyperlipidemia   . Hypertension   . Osteoarthritis   . Osteoporosis   . Stroke ([place])   . Syncope     Past Surgical History:  Procedure Laterality Date  . ABDOMINAL HYSTERECTOMY     BSO- fibroids  . ABI's     normal  . dexa  1/05   osteoporosis  . ESOPHAGOGASTRODUODENOSCOPY N/A 03/21/2014   Procedure: ESOPHAGOGASTRODUODENOSCOPY (EGD);  Surgeon: Melene Plan  Ardis Hughs, MD;  Location: [place];  Service: Endoscopy;  Laterality: N/A;  . left foot brace    . WRIST FRACTURE SURGERY  2/01   R arm    Prior to Admission medications   Medication Sig Start Date End Date Taking? Authorizing Provider  Cholecalciferol (VITAMIN D-3) 1000 units CAPS Take 1 capsule See admin instructions by mouth. Take 1000 units in the morning and 2000 in the evening   Yes [provider]  clopidogrel (PLAVIX) 75 MG tablet TAKE 1 TABLET BY MOUTH EVERY DAY 02/27/17  Yes Ward Givens, NP  donepezil (ARICEPT) 10 MG tablet TAKE 1 TABLET (10 MG TOTAL) BY MOUTH AT BEDTIME. 06/25/17  Yes Melvenia Beam, MD  Fexofenadine HCl (ALLERGY 24-HR PO) Take 1 capsule by mouth daily.   Yes [provider]  hydrochlorothiazide (HYDRODIURIL) 25 MG tablet Take 1 tablet (25 mg total) by mouth daily. 04/16/17  Yes Tower, Wynelle Fanny, MD  lisinopril (PRINIVIL,ZESTRIL) 20 MG tablet Take 1 tablet (20 mg total) by mouth daily. 04/16/17  Yes  Tower, Wynelle Fanny, MD  loperamide (IMODIUM A-D) 2 MG tablet Take 2 mg by mouth 4 (four) times daily as needed for diarrhea or loose stools.   Yes [provider]  memantine (NAMENDA) 10 MG tablet TAKE 1 TABLET (10 MG TOTAL) BY MOUTH 2 (TWO) TIMES DAILY. 06/25/17  Yes Melvenia Beam, MD  metoprolol tartrate (LOPRESSOR) 25 MG tablet Take 1 tablet (25 mg total) by mouth 2 (two) times daily. 12/22/17  Yes Tower, Wynelle Fanny, MD  Multiple Vitamins-Minerals ([employer] SPECTRAVITE PO) Take 1 capsule by mouth daily.   Yes [provider]  potassium chloride (KLOR-CON M10) 10 MEQ tablet Take 2 tablets (20 mEq total) by mouth daily. Patient taking differently: Take 20 mEq at bedtime by mouth.  04/16/17  Yes Tower, Wynelle Fanny, MD  vitamin B-12 (CYANOCOBALAMIN) 1000 MCG tablet Take 1,000 mcg by mouth daily.   Yes [provider]    Scheduled Meds: . cholecalciferol  1,000 Units Oral Daily  . cholecalciferol  2,000 Units Oral QHS  . [START ON 02/10/2018] donepezil  10 mg Oral QHS  . loratadine  10 mg Oral Daily  . [START ON 02/10/2018] memantine  10 mg Oral BID  . pantoprazole  40 mg Intravenous Q12H  . potassium chloride  20 mEq Oral QHS  . potassium chloride  40 mEq Oral Once  . [START ON 02/10/2018] vitamin B-12  1,000 mcg Oral Daily   Infusions: . sodium chloride 100 mL/hr at 02/09/18 0631   PRN Meds: acetaminophen, ondansetron (ZOFRAN) IV, zolpidem   Allergies as of 02/09/2018 - Review Complete 02/09/2018  Allergen Reaction Noted  . Alendronate sodium Other (See Comments) 03/05/2011  . Amlodipine besylate Hives 03/16/2007  . Calcitonin (salmon) Other (See Comments) 07/23/2010  . Simvastatin Other (See Comments) 03/05/2011    Family History  Problem Relation Age of Onset  . Heart attack Father   . Hypertension Father   . Heart attack Mother   . Throat cancer Brother   . Breast cancer Daughter     Social History   Socioeconomic History  . Marital status: Widowed     Spouse name: Not on file  . Number of children: 5  . Years of education: 12  . Highest education level: Not on file  Occupational History  . Occupation: retired    Fish farm manager: RETIRED  Social Needs  . Financial resource strain: Not on file  . Food insecurity:  Worry: Not on file    Inability: Not on file  . Transportation needs:    Medical: Not on file    Non-medical: Not on file  Tobacco Use  . Smoking status: Never Smoker  . Smokeless tobacco: Never Used  Substance and Sexual Activity  . Alcohol use: No    Alcohol/week: 0.0 standard drinks  . Drug use: No  . Sexual activity: Never  Lifestyle  . Physical activity:    Days per week: Not on file    Minutes per session: Not on file  . Stress: Not on file  Relationships  . Social connections:    Talks on phone: Not on file    Gets together: Not on file    Attends religious service: Not on file    Active member of club or organization: Not on file    Attends meetings of clubs or organizations: Not on file    Relationship status: Not on file  . Intimate partner violence:    Fear of current or ex partner: Not on file    Emotionally abused: Not on file    Physically abused: Not on file    Forced sexual activity: Not on file  Other Topics Concern  . Not on file  Social History Narrative   Has 4 brothers, 1 sister   Lives with 2 adult grandchildren and her daughter   Has 4 daughters, 1 son   Right handed   Rare caffeine     REVIEW OF SYSTEMS: Constitutional: Generally good energy level.  At home she is independent to ambulate with the assistance of a walker.  If she goes out and has to walk any distance, the family has a wheelchair which they use. ENT:  No nose bleeds Pulm: No shortness of breath.  No cough. CV:  No palpitations, no LE edema.  Chest pain. GU:  No hematuria, no frequency GI:  Per HPI Heme: Other than the rectal bleeding and recent hematochezia, she does not have unusual or excessive bleeding or  bruising. Transfusions:  Per HPI Neuro:  No headaches, no peripheral tingling or numbness Derm:  No itching, no rash or sores.  Endocrine:  No sweats or chills.  No polyuria or dysuria Immunization: Reviewed. Travel:  None beyond local counties in last few months.    PHYSICAL EXAM: Vital signs in last 24 hours: Vitals:   02/09/18 0700 02/09/18 0751  BP: 140/64 (!) 184/86  Pulse: 62 68  Resp: 19 17  Temp:  98.5 F (36.9 C)  SpO2: 100% 100%   Wt Readings from Last 3 Encounters:  02/09/18 75.6 kg  08/15/17 76.6 kg  07/23/17 75.3 kg    General: Pleasant, comfortable, non-ill-appearing elderly WF.  She is alert.  The bulk of the history was provided by the patient's daughter. Head: No facial asymmetry or swelling.  No signs of head trauma. Eyes: No scleral icterus.  No conjunctival pallor.  EOMI. Ears: Slightly diminished hearing Nose: No discharge or congestion Mouth: Edentulous.  Oral mucosa pink, moist, clear.  Tongue midline. Neck: No JVD, no masses, no thyromegaly. Lungs: Clear bilaterally.  No labored breathing or cough. Heart: RRR.  No MRG.  S1, S2 present. Abdomen: Soft.  Not tender or distended.  No HSM, masses, hernias, bruits.  Old lower midline incisional scar consistent with hysterectomy..   Rectal: Large external and palpable large internal hemorrhoids.  Sort of a cauliflower appearance.  On the right side there is an area of erythema, possible thrombosis  within the hemorrhoids.  The hemorrhoids are tender.  Started DRE but due to tenderness did not complete the exam.  There was scant amount of blood on the glove. Musc/Skeltl: No joint redness, swelling or gross deformity. Extremities: No CCE. Neurologic: Pleasant.  Follows commands.  Slow to answer questions but answers accurately.  Moves all 4 limbs without gross weakness.  No tremors.  Oriented x3. Skin: No rash, no sores or suspicious lesions. Tattoos: None. Nodes: No cervical adenopathy. Psych: Calm,  cooperative, pleasant.  Intake/Output from previous day: No intake/output data recorded. Intake/Output this shift: No intake/output data recorded.  LAB RESULTS: Recent Labs    02/09/18 0428 02/09/18 0434 02/09/18 0648  WBC 6.6  --  8.3  HGB 11.9* 12.2 11.6*  HCT 37.7 36.0 36.9  PLT 148*  --  149*   BMET Lab Results  Component Value Date   NA 143 02/09/2018   NA 143 02/09/2018   NA 140 07/05/2017   K 3.4 (L) 02/09/2018   K 3.2 (L) 02/09/2018   K 4.0 07/05/2017   CL 109 02/09/2018   CL 110 02/09/2018   CL 106 07/05/2017   CO2 22 02/09/2018   CO2 26 07/05/2017   CO2 26 05/09/2017   GLUCOSE 128 (H) 02/09/2018   GLUCOSE 131 (H) 02/09/2018   GLUCOSE 111 (H) 07/05/2017   BUN 23 02/09/2018   BUN 19 02/09/2018   BUN 15 07/05/2017   CREATININE 1.20 (H) 02/09/2018   CREATININE 1.26 (H) 02/09/2018   CREATININE 1.24 (H) 07/05/2017   CALCIUM 9.0 02/09/2018   CALCIUM 10.3 07/05/2017   CALCIUM 9.0 05/09/2017   LFT Recent Labs    02/09/18 0428  PROT 5.9*  ALBUMIN 3.5  AST 21  ALT 11  ALKPHOS 50  BILITOT 0.6   PT/INR Lab Results  Component Value Date   INR 1.08 02/09/2018   INR 0.98 07/05/2017   INR 1.14 01/07/2011   Hepatitis Panel No results for input(s): HEPBSAG, HCVAB, HEPAIGM, HEPBIGM in the last 72 hours. C-Diff No components found for: CDIFF Lipase     Component Value Date/Time   LIPASE 15 05/08/2017 0912    Drugs of Abuse  No results found for: LABOPIA, COCAINSCRNUR, LABBENZ, AMPHETMU, THCU, LABBARB   RADIOLOGY STUDIES: No results found.    IMPRESSION:   *   Painless  Hematochezia.   Leading could be from her large internal or external hemorrhoids versus stercoral ulcer versus neoplasia.  *   Family history positive for colon cancer in 2 younger brothers.  Patient has not had prior screening colonoscopy.  *   Hx gastroduodenitis 2015, H. pylori negative.  Treated with PPI at the time but has not used PPI in at least a year or more.    *    Stable mild, chronic anemia.  MCV normal.  No longer using oral iron.  MCV within normal limits.  *   Chronic Plavix for hx CVA.  Last dose was given in the morning of 02/08/2018.  *   Minor thrombocytopenia at 148.      PLAN:     *   Patient does not need to be n.p.o. so have ordered clear diet.  *   Make decision regarding colonoscopy versus flexible sigmoidoscopy once she has been seen by GI physician.  Currently she looks clinically stable enough to prep for and undergo colonoscopy.   Ideally colonoscopy would be pursued after she is been off Plavix for 5 days, though probably could shorten  the interval to 3 or 4 days.  *   Hemoglobins/hematocrit this afternoon, CBC in the morning.  *    He does not need IV Protonix.  Will begin oral Protonix.  Although she does not have any GERD symptoms, given that she takes chronic Plavix, and given her history of gastroduodenitis, she ought to be on chronic PPI vs H2 blocker. Wendy Krueger  02/09/2018, 10:11 AM Phone [phone]     Attending physician's note   I have taken a history, examined the patient and reviewed the chart. I agree with the Advanced Practitioner's note, impression and recommendations.  Painless hematochezia on Plavix post CVA.  Family history of colon cancer.   Trend CBC. Discussed options with patient and family members if no more bleeding or minimal bleeding occurs: colonoscopy, flex sigmoidoscopy or no further evaluation. If she has recurrent moderate to major bleeding consider tagged RBC scan. They are concerned about her ability to tolerate a bowel prep and a procedure. I addressed their questions to their satisfaction. They will further consider. If they decide to proceed with colonoscopy or flex sig would wait until at least Wednesday to allow Plavix wash out.   Wendy Edward, MD FACG [phone] office

## 2018-02-10 ENCOUNTER — Other Ambulatory Visit: Payer: Self-pay

## 2018-02-10 ENCOUNTER — Encounter (HOSPITAL_COMMUNITY): Payer: Self-pay | Admitting: General Practice

## 2018-02-10 LAB — BASIC METABOLIC PANEL
Anion gap: 7 (ref 5–15)
BUN: 8 mg/dL (ref 8–23)
CO2: 23 mmol/L (ref 22–32)
Calcium: 8.5 mg/dL — ABNORMAL LOW (ref 8.9–10.3)
Chloride: 112 mmol/L — ABNORMAL HIGH (ref 98–111)
Creatinine, Ser: 1.03 mg/dL — ABNORMAL HIGH (ref 0.44–1.00)
GFR calc Af Amer: 55 mL/min — ABNORMAL LOW (ref >60)
GFR, EST NON AFRICAN AMERICAN: 48 mL/min — AB (ref >60)
GLUCOSE: 88 mg/dL (ref 70–99)
Potassium: 3.3 mmol/L — ABNORMAL LOW (ref 3.5–5.1)
Sodium: 142 mmol/L (ref 135–145)

## 2018-02-10 LAB — CBC
HCT: 31.5 % — ABNORMAL LOW (ref 36.0–46.0)
Hemoglobin: 10 g/dL — ABNORMAL LOW (ref 12.0–15.0)
MCH: 30.6 pg (ref 26.0–34.0)
MCHC: 31.7 g/dL (ref 30.0–36.0)
MCV: 96.3 fL (ref 78.0–100.0)
Platelets: 131 10*3/uL — ABNORMAL LOW (ref 150–400)
RBC: 3.27 MIL/uL — ABNORMAL LOW (ref 3.87–5.11)
RDW: 13 % (ref 11.5–15.5)
WBC: 4 10*3/uL (ref 4.0–10.5)

## 2018-02-10 MED ORDER — MAGNESIUM SULFATE 2 GM/50ML IV SOLN
2.0000 g | Freq: Once | INTRAVENOUS | Status: AC
  Administered 2018-02-10: 2 g via INTRAVENOUS
  Filled 2018-02-10: qty 50

## 2018-02-10 MED ORDER — HYDROCORTISONE 2.5 % RE CREA
TOPICAL_CREAM | Freq: Three times a day (TID) | RECTAL | Status: DC
  Administered 2018-02-10 – 2018-02-11 (×4): via RECTAL
  Administered 2018-02-12: 1 via RECTAL
  Filled 2018-02-10: qty 28.35

## 2018-02-10 NOTE — Progress Notes (Signed)
Patient ID: Wendy Krueger, female   DOB: 27-Jun-1930, 84 y.o.   MRN: [id number]  PROGRESS NOTE    Wendy Krueger  OYD:[id number] DOB: Mar 05, 1930 DOA: 02/09/2018  PCP: Abner Greenspan, MD   Brief Narrative:  84 year old lady with past medical history of hypertension, hyperlipidemia, stroke, colitis, dementia, left bundle blockage, GI bleeding.  Had EGD by Dr.Jacob on 03/21/2014 which showed gastritis and duodenitis. Pt presented with rectal bleed. FOBT positive.  Patient had hypotension with blood pressure 60/40, which improved to 112/60 after 500 cc fluid. GI seen the pt in consultation.   Assessment & Plan:   Principal Problem:   Rectal bleeding / Acute blood loss anemia - Likely hemorrhoidal vs diverticular or stercoral ulcer - Hgb stable - GI following   Active Problems:   Dementia without behavioral disturbance - Continue Namenda      Hypomagnesemia / Hypokalemia - Due to prerenal etiology, GI bleed - Supplemented    DVT prophylaxis: SCD's Code Status: DNR/DNI Family Communication: daughter the bedside this am Disposition Plan: home once cleared by GI   Consultants:   GI  Procedures:   None  Antimicrobials:   None    Subjective: Still some bleedign this am per pt and her daughter.  Objective: Vitals:   02/09/18 2136 02/10/18 0032 02/10/18 0440 02/10/18 0810  BP: (!) 148/57 (!) 148/81 (!) 165/70 (!) 177/68  Pulse: 67 73 66 63  Resp: 16 18 18 18   Temp: 98 F (36.7 C) 98.3 F (36.8 C) 97.6 F (36.4 C) 98.1 F (36.7 C)  TempSrc: Oral Oral Oral Oral  SpO2: 100% 100% 100% 100%  Weight:   77.1 kg   Height:        Intake/Output Summary (Last 24 hours) at 02/10/2018 0858 Last data filed at 02/10/2018 0819 Gross per 24 hour  Intake 2940.8 ml  Output 1650 ml  Net 1290.8 ml   Filed Weights   02/09/18 0749 02/10/18 0440  Weight: 75.6 kg 77.1 kg    Examination:  General exam: Appears calm and comfortable  Respiratory system: Clear to  auscultation. Respiratory effort normal. Cardiovascular system: S1 & S2 heard, Rate controlled  Gastrointestinal system: nontender, non distended, (+) BS Central nervous system: Alert and oriented. No focal neurological deficits. Extremities: Symmetric 5 x 5 power. Skin: No rashes, lesions or ulcers Psychiatry: Judgement and insight appear normal. Mood & affect appropriate.   Data Reviewed: I have personally reviewed following labs and imaging studies  CBC: Recent Labs  Lab 02/09/18 0428 02/09/18 0434 02/09/18 0648 02/09/18 1049 02/09/18 1545 02/10/18 0527  WBC 6.6  --  8.3 5.1  --  4.0  NEUTROABS 4.7  --   --   --   --   --   HGB 11.9* 12.2 11.6* 10.9* 10.8* 10.0*  HCT 37.7 36.0 36.9 34.0* 34.2* 31.5*  MCV 97.7  --  97.1 96.0  --  96.3  PLT 148*  --  149* 135*  --  672*   Basic Metabolic Panel: Recent Labs  Lab 02/09/18 0428 02/09/18 0434 02/09/18 0724 02/10/18 0527  NA 143 143  --  142  K 3.2* 3.4*  --  3.3*  CL 110 109  --  112*  CO2 22  --   --  23  GLUCOSE 131* 128*  --  88  BUN 19 23  --  8  CREATININE 1.26* 1.20*  --  1.03*  CALCIUM 9.0  --   --  8.5*  MG  --   --  1.6*  --    GFR: Estimated Creatinine Clearance: 36 mL/min (A) (by C-G formula based on SCr of 1.03 mg/dL (H)). Liver Function Tests: Recent Labs  Lab 02/09/18 0428  AST 21  ALT 11  ALKPHOS 50  BILITOT 0.6  PROT 5.9*  ALBUMIN 3.5   No results for input(s): LIPASE, AMYLASE in the last 168 hours. No results for input(s): AMMONIA in the last 168 hours. Coagulation Profile: Recent Labs  Lab 02/09/18 0648  INR 1.08   Cardiac Enzymes: No results for input(s): CKTOTAL, CKMB, CKMBINDEX, TROPONINI in the last 168 hours. BNP (last 3 results) No results for input(s): PROBNP in the last 8760 hours. HbA1C: No results for input(s): HGBA1C in the last 72 hours. CBG: No results for input(s): GLUCAP in the last 168 hours. Lipid Profile: No results for input(s): CHOL, HDL, LDLCALC, TRIG,  CHOLHDL, LDLDIRECT in the last 72 hours. Thyroid Function Tests: No results for input(s): TSH, T4TOTAL, FREET4, T3FREE, THYROIDAB in the last 72 hours. Anemia Panel: No results for input(s): VITAMINB12, FOLATE, FERRITIN, TIBC, IRON, RETICCTPCT in the last 72 hours. Urine analysis:    Component Value Date/Time   COLORURINE YELLOW 07/05/2017 [place] 07/05/2017 1221   LABSPEC 1.010 07/05/2017 1221   PHURINE 7.0 07/05/2017 1221   GLUCOSEU NEGATIVE 07/05/2017 1221   HGBUR NEGATIVE 07/05/2017 1221   BILIRUBINUR NEGATIVE 07/05/2017 1221   BILIRUBINUR negative 02/21/2017 1628   KETONESUR NEGATIVE 07/05/2017 1221   PROTEINUR NEGATIVE 07/05/2017 1221   UROBILINOGEN 0.2 03/20/2014 0753   NITRITE NEGATIVE 07/05/2017 1221   LEUKOCYTESUR NEGATIVE 07/05/2017 1221   Sepsis Labs: @LABRCNTIP (procalcitonin:4,lacticidven:4)   )No results found for this or any previous visit (from the past 240 hour(s)).    Radiology Studies: No results found.    Scheduled Meds: . cholecalciferol  1,000 Units Oral Daily  . cholecalciferol  2,000 Units Oral QHS  . donepezil  10 mg Oral QHS  . loratadine  10 mg Oral Daily  . memantine  10 mg Oral BID  . pantoprazole  40 mg Oral Q0600  . potassium chloride  20 mEq Oral QHS  . vitamin B-12  1,000 mcg Oral Daily   Continuous Infusions: . sodium chloride 100 mL/hr at 02/10/18 0504     LOS: 1 day    Time spent: 25 minutes Greater than 50% of the time spent on counseling and coordinating the care.   Leisa Lenz, MD [facility] Pager [phone]  If 7PM-7AM, please contact night-coverage [email or web address] Password [facility] 02/10/2018, 8:58 AM

## 2018-02-10 NOTE — Progress Notes (Addendum)
Daily Rounding Note  02/10/2018, 12:03 PM  LOS: 1 day   SUBJECTIVE:   Chief complaint: rectal bleeding has lessened, back to her baseline of blood when she is cleaned up after urinating.  Has not had a BM.  No rectal pain      OBJECTIVE:         Vital signs in last 24 hours:    Temp:  [97.6 F (36.4 C)-98.8 F (37.1 C)] 98.1 F (36.7 C) (08/13 0810) Pulse Rate:  [59-73] 63 (08/13 0810) Resp:  [16-18] 18 (08/13 0810) BP: (148-177)/(51-81) 177/66 (08/13 1100) SpO2:  [99 %-100 %] 100 % (08/13 0810) Weight:  [77.1 kg] 77.1 kg (08/13 0440) Last BM Date: 02/09/18 Filed Weights   02/09/18 0749 02/10/18 0440  Weight: 75.6 kg 77.1 kg   General: looks chronically ill   Heart: RRR.   Chest: clear bil.  No labored breathing Abdomen: soft, active BS, NT, ND  Extremities: no CCE Neuro/Psych:  Oriented to place, self.  Appropriate.  Calm.    Intake/Output from previous day: 08/12 0701 - 08/13 0700 In: 2940.8 [P.O.:600; I.V.:2340.8] Out: 900 [Urine:900]  Intake/Output this shift: Total I/O In: 240 [P.O.:240] Out: 750 [Urine:750]  Lab Results: Recent Labs    02/09/18 0648 02/09/18 1049 02/09/18 1545 02/10/18 0527  WBC 8.3 5.1  --  4.0  HGB 11.6* 10.9* 10.8* 10.0*  HCT 36.9 34.0* 34.2* 31.5*  PLT 149* 135*  --  131*   BMET Recent Labs    02/09/18 0428 02/09/18 0434 02/10/18 0527  NA 143 143 142  K 3.2* 3.4* 3.3*  CL 110 109 112*  CO2 22  --  23  GLUCOSE 131* 128* 88  BUN 19 23 8   CREATININE 1.26* 1.20* 1.03*  CALCIUM 9.0  --  8.5*   LFT Recent Labs    02/09/18 0428  PROT 5.9*  ALBUMIN 3.5  AST 21  ALT 11  ALKPHOS 50  BILITOT 0.6   PT/INR Recent Labs    02/09/18 0648  LABPROT 13.9  INR 1.08   Hepatitis Panel No results for input(s): HEPBSAG, HCVAB, HEPAIGM, HEPBIGM in the last 72 hours.  Studies/Results: No results found.  Scheduled Meds: . cholecalciferol  1,000 Units Oral Daily    . cholecalciferol  2,000 Units Oral QHS  . donepezil  10 mg Oral QHS  . loratadine  10 mg Oral Daily  . memantine  10 mg Oral BID  . pantoprazole  40 mg Oral Q0600  . potassium chloride  20 mEq Oral QHS  . vitamin B-12  1,000 mcg Oral Daily   Continuous Infusions: . sodium chloride 100 mL/hr at 02/10/18 0504   PRN Meds:.acetaminophen, ondansetron (ZOFRAN) IV, zolpidem  ASSESMENT:   *   Painless hematochezia, acute on chronic.  Vigorous bleeding has resolved.    ? from large internal or external hemorrhoids versus stercoral ulcer versus neoplasia.  *   Family history positive for colon cancer in 2 younger brothers.  Patient has not had prior screening colonoscopy.  *   Hx gastroduodenitis 2015, H. pylori negative.  Treated with PPI at the time but has not used PPI in at least a year or more.  Protonix po started 8/12.    *   Acute on chronic anemia.  MCV normal.  Previous IDA but no longer taking oral iron. Not in need of transfusion.    *   Chronic Plavix for hx CVA (?2017).  Last dose was given in the morning of 02/08/2018.  *   Non-critical thrombocytopenia.     *   Mild, improving, AKI.  Hypokalemia.     PLAN   *  Adding Anusol HC cream for hemorrhoids.    *  As bleeding is resolving, will not plan to pursue colonoscopy or flex sig in this frail elderly pt.    *  Advance diet.    *   ? Timing to restart Plavix.  Should this be permanently stopped?   Azucena Freed  02/10/2018, 12:03 PM Phone [phone]     Attending physician's note   I have taken an interval history, reviewed the chart and examined the patient. I agree with the Advanced Practitioner's note, impression and recommendations. Rectal bleeding is resolving. Suspected hemorrhoidal bleed. R/O diverticulosis, neoplasm, stercoral ulcer. Will not plan to pursue colonoscopy or flex sig in this frail, elderly patient at this time. Treat hemorrhoids with Anusol HC cream tid and observe. If significant  bleeding recurs will reconsider endoscopic procedures. Would wait at least 4 days before resuming Plavix, if it is resumed.   Lucio Edward, MD FACG [phone] office

## 2018-02-11 LAB — BASIC METABOLIC PANEL
Anion gap: 7 (ref 5–15)
BUN: 8 mg/dL (ref 8–23)
CHLORIDE: 114 mmol/L — AB (ref 98–111)
CO2: 21 mmol/L — ABNORMAL LOW (ref 22–32)
CREATININE: 1.03 mg/dL — AB (ref 0.44–1.00)
Calcium: 8.4 mg/dL — ABNORMAL LOW (ref 8.9–10.3)
GFR calc Af Amer: 55 mL/min — ABNORMAL LOW (ref >60)
GFR calc non Af Amer: 48 mL/min — ABNORMAL LOW (ref >60)
GLUCOSE: 96 mg/dL (ref 70–99)
POTASSIUM: 3.8 mmol/L (ref 3.5–5.1)
SODIUM: 142 mmol/L (ref 135–145)

## 2018-02-11 LAB — CBC
HCT: 31.9 % — ABNORMAL LOW (ref 36.0–46.0)
HEMOGLOBIN: 10.1 g/dL — AB (ref 12.0–15.0)
MCH: 30.7 pg (ref 26.0–34.0)
MCHC: 31.7 g/dL (ref 30.0–36.0)
MCV: 97 fL (ref 78.0–100.0)
Platelets: 135 10*3/uL — ABNORMAL LOW (ref 150–400)
RBC: 3.29 MIL/uL — AB (ref 3.87–5.11)
RDW: 13.1 % (ref 11.5–15.5)
WBC: 4.4 10*3/uL (ref 4.0–10.5)

## 2018-02-11 LAB — MAGNESIUM: Magnesium: 1.9 mg/dL (ref 1.7–2.4)

## 2018-02-11 MED ORDER — HYDRALAZINE HCL 25 MG PO TABS
25.0000 mg | ORAL_TABLET | Freq: Three times a day (TID) | ORAL | Status: DC
  Administered 2018-02-11 – 2018-02-12 (×4): 25 mg via ORAL
  Filled 2018-02-11 (×4): qty 1

## 2018-02-11 NOTE — Progress Notes (Signed)
Patient ID: Wendy Krueger, female   DOB: 1930-03-17, 84 y.o.   MRN: [id number]  PROGRESS NOTE    Wendy Krueger  JME:[id number] DOB: 04-23-1930 DOA: 02/09/2018  PCP: Abner Greenspan, MD   Brief Narrative:  84 year old lady with past medical history of hypertension, hyperlipidemia, stroke, colitis, dementia, left bundle blockage, GI bleeding.  Had EGD by Dr.Jacob on 03/21/2014 which showed gastritis and duodenitis. Pt presented with rectal bleed. FOBT positive.  Patient had hypotension with blood pressure 60/40, which improved to 112/60 after 500 cc fluid. GI seen the pt in consultation.   Assessment & Plan:   Principal Problem:   Rectal bleeding / Acute blood loss anemia - Likely hemorrhoidal vs diverticular or stercoral ulcer vs neoplasm although low suspicion for the last - Hgb stable, 10.1 although pt reports still having residual bleed - Per GI, they will not pursue coloscopy or flex sig in this frail elderly female, but resume plavix in 4 days from admission if it needs to be resumed at all.  - Anusol prep for hemorrhoids  - Continue protonix 40 mg daily   Active Problems: Dementia without behavioral disturbance - Continue Namenda and Aricept   Hypomagnesemia / Hypokalemia - Due to prerenal etiology, GI bleed - Supplemented   AKI - Pre-renal etiology  - Cr stable, 1.03   DVT prophylaxis: SCD's Code Status: DNR/DNI Family Communication: daughter at bedside this am Disposition Plan: home once bleed stops   Consultants:   GI  Procedures:   None  Antimicrobials:   None    Subjective: No overnight events.  Objective: Vitals:   02/10/18 2022 02/11/18 0030 02/11/18 0544 02/11/18 0800  BP: (!) 167/82 (!) 152/85 (!) 196/80 (!) 171/85  Pulse: 89 75 71   Resp: 18 18 18    Temp: 97.7 F (36.5 C) 98.4 F (36.9 C) 98.1 F (36.7 C)   TempSrc: Oral Oral Oral   SpO2: 99% 99% 100%   Weight:   78.4 kg   Height:        Intake/Output Summary (Last 24  hours) at 02/11/2018 0804 Last data filed at 02/10/2018 2000 Gross per 24 hour  Intake 902 ml  Output 850 ml  Net 52 ml   Filed Weights   02/09/18 0749 02/10/18 0440 02/11/18 0544  Weight: 75.6 kg 77.1 kg 78.4 kg    Examination:  Constitutional: Appears well-developed and well-nourished. No distress.  Eyes: Conjunctivae and EOM are normal. PERRLA, no scleral icterus.  Neck: Normal ROM. Neck supple. No JVD. No tracheal deviation. No thyromegaly.  CVS: Rate controlled, S1/S2 + Pulmonary: no wheezing, no rhonchi  Abdominal: soft, non tender, non distended, (+) BS Musculoskeletal: Normal range of motion. No edema a  Lymphadenopathy: No lymphadenopathy noted, cervical, inguinal. Neuro: Alert. Normal reflexes, muscle tone coordination. No cranial nerve deficit. Skin: Skin is warm and dry. No rash noted. Not diaphoretic. No erythema. No pallor.  Psychiatric: Normal mood and affect. Behavior, judgment, thought content normal.    Data Reviewed: I have personally reviewed following labs and imaging studies  CBC: Recent Labs  Lab 02/09/18 0428  02/09/18 0648 02/09/18 1049 02/09/18 1545 02/10/18 0527 02/11/18 0426  WBC 6.6  --  8.3 5.1  --  4.0 4.4  NEUTROABS 4.7  --   --   --   --   --   --   HGB 11.9*   < > 11.6* 10.9* 10.8* 10.0* 10.1*  HCT 37.7   < > 36.9 34.0* 34.2* 31.5* 31.9*  MCV 97.7  --  97.1 96.0  --  96.3 97.0  PLT 148*  --  149* 135*  --  131* 135*   < > = values in this interval not displayed.   Basic Metabolic Panel: Recent Labs  Lab 02/09/18 0428 02/09/18 0434 02/09/18 0724 02/10/18 0527 02/11/18 0426  NA 143 143  --  142 142  K 3.2* 3.4*  --  3.3* 3.8  CL 110 109  --  112* 114*  CO2 22  --   --  23 21*  GLUCOSE 131* 128*  --  88 96  BUN 19 23  --  8 8  CREATININE 1.26* 1.20*  --  1.03* 1.03*  CALCIUM 9.0  --   --  8.5* 8.4*  MG  --   --  1.6*  --  1.9   GFR: Estimated Creatinine Clearance: 36.3 mL/min (A) (by C-G formula based on SCr of 1.03 mg/dL  (H)). Liver Function Tests: Recent Labs  Lab 02/09/18 0428  AST 21  ALT 11  ALKPHOS 50  BILITOT 0.6  PROT 5.9*  ALBUMIN 3.5   No results for input(s): LIPASE, AMYLASE in the last 168 hours. No results for input(s): AMMONIA in the last 168 hours. Coagulation Profile: Recent Labs  Lab 02/09/18 0648  INR 1.08   Cardiac Enzymes: No results for input(s): CKTOTAL, CKMB, CKMBINDEX, TROPONINI in the last 168 hours. BNP (last 3 results) No results for input(s): PROBNP in the last 8760 hours. HbA1C: No results for input(s): HGBA1C in the last 72 hours. CBG: No results for input(s): GLUCAP in the last 168 hours. Lipid Profile: No results for input(s): CHOL, HDL, LDLCALC, TRIG, CHOLHDL, LDLDIRECT in the last 72 hours. Thyroid Function Tests: No results for input(s): TSH, T4TOTAL, FREET4, T3FREE, THYROIDAB in the last 72 hours. Anemia Panel: No results for input(s): VITAMINB12, FOLATE, FERRITIN, TIBC, IRON, RETICCTPCT in the last 72 hours. Urine analysis:    Component Value Date/Time   COLORURINE YELLOW 07/05/2017 [place] 07/05/2017 1221   LABSPEC 1.010 07/05/2017 1221   PHURINE 7.0 07/05/2017 1221   GLUCOSEU NEGATIVE 07/05/2017 1221   HGBUR NEGATIVE 07/05/2017 1221   BILIRUBINUR NEGATIVE 07/05/2017 1221   BILIRUBINUR negative 02/21/2017 1628   KETONESUR NEGATIVE 07/05/2017 1221   PROTEINUR NEGATIVE 07/05/2017 1221   UROBILINOGEN 0.2 03/20/2014 0753   NITRITE NEGATIVE 07/05/2017 1221   LEUKOCYTESUR NEGATIVE 07/05/2017 1221   Sepsis Labs: @LABRCNTIP (procalcitonin:4,lacticidven:4)   )No results found for this or any previous visit (from the past 240 hour(s)).    Radiology Studies: No results found.    Scheduled Meds: . cholecalciferol  1,000 Units Oral Daily  . cholecalciferol  2,000 Units Oral QHS  . donepezil  10 mg Oral QHS  . hydrocortisone   Rectal TID  . loratadine  10 mg Oral Daily  . memantine  10 mg Oral BID  . pantoprazole  40 mg Oral  Q0600  . potassium chloride  20 mEq Oral QHS  . vitamin B-12  1,000 mcg Oral Daily   Continuous Infusions: . sodium chloride Stopped (02/11/18 0613)     LOS: 2 days    Time spent: 25 minutes Greater than 50% of the time spent on counseling and coordinating the care.   Leisa Lenz, MD [facility] Pager [phone]  If 7PM-7AM, please contact night-coverage [email or web address] Password [facility] 02/11/2018, 8:04 AM

## 2018-02-11 NOTE — Progress Notes (Signed)
MD page concerning patient B/P been elevated and no B/P meds on MAR.

## 2018-02-11 NOTE — Progress Notes (Addendum)
Daily Rounding Note  02/11/2018, 11:49 AM  LOS: 2 days   SUBJECTIVE:   Chief complaint:  Minor blood with wiping.  Vigorous rectal bleeding has not recurred.  Remains off Plavix.     Feels well, eating well  OBJECTIVE:         Vital signs in last 24 hours:    Temp:  [97.7 F (36.5 C)-98.4 F (36.9 C)] 98.4 F (36.9 C) (08/14 0800) Pulse Rate:  [60-89] 60 (08/14 0800) Resp:  [18] 18 (08/14 0800) BP: (152-196)/(80-85) 171/85 (08/14 0800) SpO2:  [99 %-100 %] 100 % (08/14 0800) Weight:  [78.4 kg] 78.4 kg (08/14 0544) Last BM Date: 02/09/18 Filed Weights   02/09/18 0749 02/10/18 0440 02/11/18 0544  Weight: 75.6 kg 77.1 kg 78.4 kg   General: looks great, smiling and comfortable.     Heart: RRR Chest: clear bil.  No dyspnea Abdomen: soft, NT, active BS, ND  Extremities: no CCE Neuro/Psych:  follows commands.  Calm.  Not a lot to say.    Intake/Output from previous day: 08/13 0701 - 08/14 0700 In: 902 [P.O.:802; I.V.:100] Out: 850 [Urine:850]  Intake/Output this shift: Total I/O In: 240 [P.O.:240] Out: 400 [Urine:400]  Lab Results: Recent Labs    02/09/18 1049 02/09/18 1545 02/10/18 0527 02/11/18 0426  WBC 5.1  --  4.0 4.4  HGB 10.9* 10.8* 10.0* 10.1*  HCT 34.0* 34.2* 31.5* 31.9*  PLT 135*  --  131* 135*   BMET Recent Labs    02/09/18 0428 02/09/18 0434 02/10/18 0527 02/11/18 0426  NA 143 143 142 142  K 3.2* 3.4* 3.3* 3.8  CL 110 109 112* 114*  CO2 22  --  23 21*  GLUCOSE 131* 128* 88 96  BUN 19 23 8 8   CREATININE 1.26* 1.20* 1.03* 1.03*  CALCIUM 9.0  --  8.5* 8.4*   LFT Recent Labs    02/09/18 0428  PROT 5.9*  ALBUMIN 3.5  AST 21  ALT 11  ALKPHOS 50  BILITOT 0.6   PT/INR Recent Labs    02/09/18 0648  LABPROT 13.9  INR 1.08     ASSESMENT:   *    Acute on chronic painless hematochezia.  Large internal and external hemorrhoids on physical exam.  No previous colonoscopy.  *    Anemia, normocytic. Hgb stable.     *    Chronic Plavix (for history of CVA?).  Currently on hold.  Not clear whether this will be restarted or not.  Daughter not sure why pt on Plavix.     PLAN   *   Decide on Plavix or not.  If restarted would wait another week.    *   From GI standpoint ok for discharge.     Azucena Freed  02/11/2018, 11:49 AM Phone [phone]     Attending physician's note   I have taken an interval history, reviewed the chart and examined the patient. I agree with the Advanced Practitioner's note, impression and recommendations.   Resolving LGI bleed, suspected hemorrhoidal bleed.   After a discussion with the patient and her daughter offering inpatient colonoscopy, outpatient colonoscopy or no further GI evaluation they decided colonoscopy as an outpatient. Will stay off Plavix if colonoscopy is completed by next week. If schedule requires a later colonoscopy date would resume Plavix in 4 days as clinically indicated. Continue Anusol 2.5% HC cream tid to hemorrhoids until colonoscopy is completed. OK for  discharge from a GI standpoint once we have coordinated the colonoscopy arrangements, targeting for colonoscopy on 8/21.   Lucio Edward, MD FACG [phone] office

## 2018-02-12 DIAGNOSIS — N179 Acute kidney failure, unspecified: Secondary | ICD-10-CM

## 2018-02-12 DIAGNOSIS — D62 Acute posthemorrhagic anemia: Secondary | ICD-10-CM

## 2018-02-12 DIAGNOSIS — F0150 Vascular dementia without behavioral disturbance: Secondary | ICD-10-CM

## 2018-02-12 DIAGNOSIS — I5189 Other ill-defined heart diseases: Secondary | ICD-10-CM

## 2018-02-12 DIAGNOSIS — K625 Hemorrhage of anus and rectum: Secondary | ICD-10-CM

## 2018-02-12 DIAGNOSIS — E876 Hypokalemia: Secondary | ICD-10-CM

## 2018-02-12 DIAGNOSIS — I639 Cerebral infarction, unspecified: Secondary | ICD-10-CM

## 2018-02-12 DIAGNOSIS — I10 Essential (primary) hypertension: Secondary | ICD-10-CM

## 2018-02-12 HISTORY — PX: WRIST FRACTURE SURGERY: SHX121

## 2018-02-12 MED ORDER — HYDROCORTISONE 2.5 % RE CREA: TOPICAL_CREAM | Freq: Three times a day (TID) | RECTAL | 0 refills | Status: DC

## 2018-02-12 MED ORDER — HYDROCHLOROTHIAZIDE 25 MG PO TABS
25.0000 mg | ORAL_TABLET | Freq: Every day | ORAL | Status: DC
  Administered 2018-02-12: 25 mg via ORAL
  Filled 2018-02-12: qty 1

## 2018-02-12 MED ORDER — METOPROLOL TARTRATE 25 MG PO TABS
25.0000 mg | ORAL_TABLET | Freq: Two times a day (BID) | ORAL | Status: DC
  Administered 2018-02-12: 25 mg via ORAL
  Filled 2018-02-12: qty 1

## 2018-02-12 MED ORDER — LISINOPRIL 20 MG PO TABS
20.0000 mg | ORAL_TABLET | Freq: Every day | ORAL | Status: DC
  Administered 2018-02-12: 20 mg via ORAL
  Filled 2018-02-12: qty 1

## 2018-02-12 MED ORDER — LOPERAMIDE HCL 2 MG PO CAPS
2.0000 mg | ORAL_CAPSULE | Freq: Once | ORAL | Status: AC
  Administered 2018-02-12: 2 mg via ORAL
  Filled 2018-02-12: qty 1

## 2018-02-12 NOTE — Consult Note (Signed)
            [facility] CM Primary Care Navigator  02/12/2018  Wendy Krueger [birth year] [id number]   Went to seepatient at the bedside to identify possible discharge needs but she wasalready dischargedhomeper staff.  Per MD note,patient wasadmitted for rectal bleeding- had GI consult(Rectal bleeding / Acute blood loss anemia).  Patient has discharge instruction to follow-up withprimary care provider in 1- 2 weeks and follow-up with gastroenterology on 02/13/18 and 02/18/18.  Primary care provider's office is listed as providing transition of care (TOC) follow-up.   For additional questions please contact:  Edwena Felty A. Adriyanna Christians, BSN, RN-BC [facility] PRIMARY CARE Navigator Cell: [phone]

## 2018-02-12 NOTE — Evaluation (Signed)
Physical Therapy Evaluation/ Discharge Patient Details Name: Wendy Krueger MRN: [id number] DOB: August 22, 1929 Today's Date: 02/12/2018   History of Present Illness  84 yo admitted with hematochezia. PMHx: moderate dementia; CVA; HTN; HLD; and asymptomatic gallstones  Clinical Impression  Pt very pleasantly confused and 2 daughters in room to provide history and home setup. Pt able to walk in hall and family reports current function as baseline with 24hr assist, all DME, accessible home and assist for ADLs. Family performs walking and HEP program and encouraged them to continue to maintain pt function. Pt at baseline without further therapy needs at this time, family aware and agreeable.     Follow Up Recommendations No PT follow up;Supervision/Assistance - 24 hour    Equipment Recommendations  None recommended by PT    Recommendations for Other Services       Precautions / Restrictions Precautions Precautions: Fall      Mobility  Bed Mobility Overal bed mobility: Needs Assistance Bed Mobility: Rolling;Sidelying to Sit Rolling: Modified independent (Device/Increase time) Sidelying to sit: Min assist       General bed mobility comments: use of rail to roll and assist to raise trunk from surface  Transfers Overall transfer level: Needs assistance   Transfers: Sit to/from Stand Sit to Stand: Min guard         General transfer comment: guarding for safety   Ambulation/Gait Ambulation/Gait assistance: Min guard Gait Distance (Feet): 200 Feet Assistive device: Rolling walker (2 wheeled) Gait Pattern/deviations: Step-through pattern;Trunk flexed   Gait velocity interpretation: >2.62 ft/sec, indicative of community ambulatory General Gait Details: pt with flexed trunk and guarding for safety with pt maintaining self slightly posterior to [employer]            Wheelchair Mobility    Modified Rankin (Stroke Patients Only)       Balance Overall balance  assessment: Needs assistance   Sitting balance-Leahy Scale: Good       Standing balance-Leahy Scale: Fair                               Pertinent Vitals/Pain Pain Assessment: No/denies pain    Home Living Family/patient expects to be discharged to:: Private residence Living Arrangements: Children Available Help at Discharge: Family;Available 24 hours/day Type of Home: House Home Access: Ramped entrance     Home Layout: One level Home Equipment: Walker - 2 wheels;Cane - single point;Bedside commode;Shower seat;Grab bars - tub/shower;Hand held shower head;Wheelchair - Banker;Other (comment) Additional Comments: baby monitors, bed and chair alarms, bed rail     Prior Function Level of Independence: Needs assistance   Gait / Transfers Assistance Needed: walks with RW with scissoring and will furniture walk if forgets RW  ADL's / Homemaking Assistance Needed: assist for bathing and dresssing, family does the homemaking  Comments: has assist 24hr as day     Hand Dominance        Extremity/Trunk Assessment   Upper Extremity Assessment Upper Extremity Assessment: Generalized weakness    Lower Extremity Assessment Lower Extremity Assessment: Generalized weakness    Cervical / Trunk Assessment Cervical / Trunk Assessment: Kyphotic  Communication      Cognition Arousal/Alertness: Awake/alert Behavior During Therapy: WFL for tasks assessed/performed Overall Cognitive Status: History of cognitive impairments - at baseline  General Comments      Exercises     Assessment/Plan    PT Assessment Patent does not need any further PT services  PT Problem List         PT Treatment Interventions      PT Goals (Current goals can be found in the Care Plan section)  Acute Rehab PT Goals Patient Stated Goal: return home PT Goal Formulation: All assessment and education complete, DC  therapy    Frequency     Barriers to discharge        Co-evaluation               AM-PAC PT "6 Clicks" Daily Activity  Outcome Measure Difficulty turning over in bed (including adjusting bedclothes, sheets and blankets)?: A Little Difficulty moving from lying on back to sitting on the side of the bed? : Unable Difficulty sitting down on and standing up from a chair with arms (e.g., wheelchair, bedside commode, etc,.)?: A Little Help needed moving to and from a bed to chair (including a wheelchair)?: A Little Help needed walking in hospital room?: A Little Help needed climbing 3-5 steps with a railing? : A Little 6 Click Score: 16    End of Session Equipment Utilized During Treatment: Gait belt Activity Tolerance: Patient tolerated treatment well Patient left: in chair;with call bell/phone within reach;with family/visitor present Nurse Communication: Mobility status PT Visit Diagnosis: Muscle weakness (generalized) (M62.81);History of falling (Z91.81)    Time: 8016-5537 PT Time Calculation (min) (ACUTE ONLY): 21 min   Charges:   PT Evaluation $PT Eval Low Complexity: 1 Low          [address], PT [phone]   [place] 02/12/2018, 10:37 AM

## 2018-02-12 NOTE — Discharge Summary (Signed)
Physician Discharge Summary  Wendy Krueger UVO:[id number] DOB: 1930/01/30 DOA: 02/09/2018  PCP: Abner Greenspan, MD  Admit date: 02/09/2018 Discharge date: 02/13/2018  Admitted From: Home  Disposition:  Home   Recommendations for Outpatient Follow-up:  1. Follow up with Gastroenterology tomorrow and then for colonoscopy next week 2. Follow up with PCP in 1 week 3. Please obtain BMP/CBC in one week, monitor for bleeding, and repeat Hgb in 1 month if back on Plavix 4. Please discuss with Neurology, if Plavix for stroke prevention is necessary, and if not necessary for this or other indication, consider discontinuing      Home Health: None  Equipment/Devices: None  Discharge Condition: Fair  CODE STATUS: FULL Diet recommendation: Cardiac  Brief/Interim Summary: Wendy Krueger is an 84 y.o. F with HTN, hx TIA, mild dementia who presents with rectal bleeding, gross blood per rectum.  Found to have hypotension.       Discharge Diagnoses:   Rectal bleeding Presumed hemorrhoidal bleeding Patient had large hemorrhoids on exam.  Other differential included a diverticular bleed, stercoral ulcer, cancer, ischemic colitis, although this regard.  Her hemoglobin remained stable in the hospital, she was evaluated by gastroenterology, arrange for outpatient colonoscopy.  On the day of discharge, she had no further large bleeding, and was in improving health.  Acute blood loss anemia Did not require transfusion.  Hemoglobin dropped to 10, was stable there throughout the rest of her hospitalization. Dementia  Acute kidney injury  Hypertension Cerebrovascular disease secondary prevention Plavix was stopped.  Long-term continuation should be discussed with neurology and PCP.  She should not resume Plavix at the very least until after her colonoscopy early next week.       Discharge Instructions  Discharge Instructions    Diet - low sodium heart healthy   Complete by:  As directed     Discharge instructions   Complete by:  As directed    From Dr. Loleta Books: You were admitted for rectal bleeding.  This is likely to be from hemorrhoids, but it is not clear. Thankfully, off Plavix, the bleeding has slowed down, and your blood level (hemoglobin has been stable at 10 g/dL).  For now, do not take Plavix. Follow up with Dr. Lynne Leader office tomorrow as instructed and then next week for the colonoscopy After your colonoscopy, you may resume the Plavix However, I think it may also be reasonable to discuss this with your primary care doctor and Neurologist, to see if it really is important enough to continue it for preventing a stroke.  Take loperamide/Imodium as needed for any loose stools. Use Anusol, or any other over the counter Tux pads/etc for hemorrhoids.  Resume your home blood presure medicines (lisinopril, HCTZ, and metoprolol) and follow up with Dr. Glori Bickers in 1 week to recheck blood levels and blood pressure.   Increase activity slowly   Complete by:  As directed      Allergies as of 02/12/2018      Reactions   Alendronate Sodium Other (See Comments)   Leg pain    Amlodipine Besylate Hives   Calcitonin (salmon) Other (See Comments)    headache/ head pressure   Simvastatin Other (See Comments)   Leg pain       Medication List    STOP taking these medications   clopidogrel 75 MG tablet Commonly known as:  PLAVIX     TAKE these medications   ALLERGY 24-HR PO Take 1 capsule by mouth daily.   [facility] SPECTRAVITE  PO Take 1 capsule by mouth daily.   donepezil 10 MG tablet Commonly known as:  ARICEPT TAKE 1 TABLET (10 MG TOTAL) BY MOUTH AT BEDTIME.   hydrochlorothiazide 25 MG tablet Commonly known as:  HYDRODIURIL Take 1 tablet (25 mg total) by mouth daily.   hydrocortisone 2.5 % rectal cream Commonly known as:  ANUSOL-HC Place rectally 3 (three) times daily.   lisinopril 20 MG tablet Commonly known as:  PRINIVIL,ZESTRIL Take 1 tablet (20 mg total) by  mouth daily.   loperamide 2 MG tablet Commonly known as:  IMODIUM A-D Take 2 mg by mouth 4 (four) times daily as needed for diarrhea or loose stools.   memantine 10 MG tablet Commonly known as:  NAMENDA TAKE 1 TABLET (10 MG TOTAL) BY MOUTH 2 (TWO) TIMES DAILY.   metoprolol tartrate 25 MG tablet Commonly known as:  LOPRESSOR Take 1 tablet (25 mg total) by mouth 2 (two) times daily.   potassium chloride 10 MEQ tablet Commonly known as:  K-DUR,KLOR-CON Take 2 tablets (20 mEq total) by mouth daily. What changed:  when to take this   vitamin B-12 1000 MCG tablet Commonly known as:  CYANOCOBALAMIN Take 1,000 mcg by mouth daily.   Vitamin D-3 1000 units Caps Take 1 capsule See admin instructions by mouth. Take 1000 units in the morning and 2000 in the evening      Follow-up Information    [facility] Follow up on 02/13/2018.   Specialty:  Gastroenterology Why:  10 AM visit with GI nurse to go through instructions for colonoscopy  Contact information: [place] [address] [phone]       [facility] Follow up on 02/18/2018.   Specialty:  Gastroenterology Why:  2 PM arrival for 3PM colonoscopy with Dr Fuller Plan.   Contact information: [place] [address] [phone]       Abner Greenspan, MD On 02/12/2018.   Specialties:  Family Medicine, Radiology Contact information: [place] [address] [phone]          Allergies  Allergen Reactions  . Alendronate Sodium Other (See Comments)    Leg pain   . Amlodipine Besylate Hives  . Calcitonin ([place]) Other (See Comments)     headache/ head pressure  . Simvastatin Other (See Comments)    Leg pain     Consultations:  Gastroenterology   Procedures/Studies: No results found.    Subjective: Minimal blood with wiping overnight.  Some more loose stools overnight.  No dizziness,  confusion.  No LOC, syncope.  Discharge Exam: Vitals:   02/12/18 0925 02/12/18 1257  BP: (!) 161/71 (!) 160/80  Pulse:  60  Resp:    Temp:  98.8 F (37.1 C)  SpO2:  100%   Vitals:   02/12/18 0300 02/12/18 0524 02/12/18 0925 02/12/18 1257  BP:  (!) 191/84 (!) 161/71 (!) 160/80  Pulse:  70  60  Resp:  20    Temp:  98.5 F (36.9 C)  98.8 F (37.1 C)  TempSrc:  Oral  Oral  SpO2:  98%  100%  Weight: 78.5 kg     Height:        General: Pt is alert, awake, not in acute distress Cardiovascular: RRR, S1/S2 +, no rubs, no gallops Respiratory: CTA bilaterally, no wheezing, no rhonchi Abdominal: Soft, NT, ND, bowel sounds + Extremities: no edema, no cyanosis    The results of significant diagnostics  from this hospitalization (including imaging, microbiology, ancillary and laboratory) are listed below for reference.     Microbiology: No results found for this or any previous visit (from the past 240 hour(s)).   Labs: BNP (last 3 results) No results for input(s): BNP in the last 8760 hours. Basic Metabolic Panel: Recent Labs  Lab 02/09/18 0428 02/09/18 0434 02/09/18 0724 02/10/18 0527 02/11/18 0426  NA 143 143  --  142 142  K 3.2* 3.4*  --  3.3* 3.8  CL 110 109  --  112* 114*  CO2 22  --   --  23 21*  GLUCOSE 131* 128*  --  88 96  BUN 19 23  --  8 8  CREATININE 1.26* 1.20*  --  1.03* 1.03*  CALCIUM 9.0  --   --  8.5* 8.4*  MG  --   --  1.6*  --  1.9   Liver Function Tests: Recent Labs  Lab 02/09/18 0428  AST 21  ALT 11  ALKPHOS 50  BILITOT 0.6  PROT 5.9*  ALBUMIN 3.5   No results for input(s): LIPASE, AMYLASE in the last 168 hours. No results for input(s): AMMONIA in the last 168 hours. CBC: Recent Labs  Lab 02/09/18 0428  02/09/18 0648 02/09/18 1049 02/09/18 1545 02/10/18 0527 02/11/18 0426  WBC 6.6  --  8.3 5.1  --  4.0 4.4  NEUTROABS 4.7  --   --   --   --   --   --   HGB 11.9*   < > 11.6* 10.9* 10.8* 10.0* 10.1*  HCT 37.7   < > 36.9 34.0*  34.2* 31.5* 31.9*  MCV 97.7  --  97.1 96.0  --  96.3 97.0  PLT 148*  --  149* 135*  --  131* 135*   < > = values in this interval not displayed.   Cardiac Enzymes: No results for input(s): CKTOTAL, CKMB, CKMBINDEX, TROPONINI in the last 168 hours. BNP: Invalid input(s): POCBNP CBG: No results for input(s): GLUCAP in the last 168 hours. D-Dimer No results for input(s): DDIMER in the last 72 hours. Hgb A1c No results for input(s): HGBA1C in the last 72 hours. Lipid Profile No results for input(s): CHOL, HDL, LDLCALC, TRIG, CHOLHDL, LDLDIRECT in the last 72 hours. Thyroid function studies No results for input(s): TSH, T4TOTAL, T3FREE, THYROIDAB in the last 72 hours.  Invalid input(s): FREET3 Anemia work up No results for input(s): VITAMINB12, FOLATE, FERRITIN, TIBC, IRON, RETICCTPCT in the last 72 hours. Urinalysis    Component Value Date/Time   COLORURINE YELLOW 07/05/2017 [place] 07/05/2017 1221   LABSPEC 1.010 07/05/2017 1221   PHURINE 7.0 07/05/2017 1221   GLUCOSEU NEGATIVE 07/05/2017 1221   HGBUR NEGATIVE 07/05/2017 1221   BILIRUBINUR NEGATIVE 07/05/2017 1221   BILIRUBINUR negative 02/21/2017 1628   KETONESUR NEGATIVE 07/05/2017 1221   PROTEINUR NEGATIVE 07/05/2017 1221   UROBILINOGEN 0.2 03/20/2014 0753   NITRITE NEGATIVE 07/05/2017 1221   LEUKOCYTESUR NEGATIVE 07/05/2017 1221   Sepsis Labs Invalid input(s): PROCALCITONIN,  WBC,  LACTICIDVEN Microbiology No results found for this or any previous visit (from the past 240 hour(s)).   Time coordinating discharge: 40 minutes       SIGNED:   Edwin Dada, MD  [facility] 02/12/2018, 6:20 PM

## 2018-02-12 NOTE — Progress Notes (Signed)
Discharge eduction provided at bedside with pt and pt daughters. Pt daughters have all belongings. Pt IV removed, catheter intact. Pt discharged via wheelchair with NT

## 2018-02-13 ENCOUNTER — Telehealth: Payer: Self-pay | Admitting: *Deleted

## 2018-02-13 ENCOUNTER — Ambulatory Visit (INDEPENDENT_AMBULATORY_CARE_PROVIDER_SITE_OTHER): Payer: Medicare Other | Admitting: Family Medicine

## 2018-02-13 ENCOUNTER — Ambulatory Visit (INDEPENDENT_AMBULATORY_CARE_PROVIDER_SITE_OTHER): Payer: Medicare Other

## 2018-02-13 ENCOUNTER — Encounter (INDEPENDENT_AMBULATORY_CARE_PROVIDER_SITE_OTHER): Payer: Self-pay | Admitting: Family Medicine

## 2018-02-13 ENCOUNTER — Ambulatory Visit (INDEPENDENT_AMBULATORY_CARE_PROVIDER_SITE_OTHER): Payer: Medicare Other | Admitting: Orthopaedic Surgery

## 2018-02-13 DIAGNOSIS — R197 Diarrhea, unspecified: Secondary | ICD-10-CM

## 2018-02-13 DIAGNOSIS — S52572A Other intraarticular fracture of lower end of left radius, initial encounter for closed fracture: Secondary | ICD-10-CM

## 2018-02-13 DIAGNOSIS — M25562 Pain in left knee: Secondary | ICD-10-CM

## 2018-02-13 DIAGNOSIS — M25532 Pain in left wrist: Secondary | ICD-10-CM | POA: Diagnosis not present

## 2018-02-13 DIAGNOSIS — M25512 Pain in left shoulder: Secondary | ICD-10-CM | POA: Diagnosis not present

## 2018-02-13 NOTE — Progress Notes (Signed)
Office Visit Note   Patient: Wendy Krueger           Date of Birth: [birth year]           MRN: [id number] Visit Date: 02/13/2018 Requested by: Tower, Wynelle Fanny, MD [place], [place] [address] PCP: Abner Greenspan, MD  Subjective: Chief Complaint  Patient presents with  . Left Shoulder - Pain  . Left Knee - Pain  . Left Wrist - Pain    HPI: She is here with left shoulder, left wrist, and left knee pain.  She was recently hospitalized for lower GI bleeding and diarrhea.  She was discharged home 2 days ago.  She was supposed to have colonoscopy next week and to follow-up with gastroenterology today.  Yesterday she got up and tried to walk without using her walker, she fell and injured her left shoulder, left wrist and left knee.  She complains of pain in these areas.  She has dementia and is accompanied by her daughters.  She is right-hand dominant.                 ROS: No fever or chills.  Objective: Vital Signs: There were no vitals taken for this visit.  Physical Exam:  Left shoulder: Able to raise her arm overhead with minimal visible pain.  No bruising around the shoulder, no point tenderness today. Left wrist: There is an abrasion on the dorsal ulnar side of the wrist which is not actively bleeding.  She has swelling and tenderness at the distal radius.  2+ radial pulse. Left knee: Trace effusion, no warmth or erythema.  No visible bruising.  Able to actively extend her knee completely.  She has slight tenderness over the lateral and medial joint lines.  Imaging: 2 view x-rays left shoulder: Osteoarthritis of the glenohumeral joint with no obvious fracture.  There is calcification in the aorta.  No obvious rib fracture seen.  No pneumothorax.  2 view x-rays left wrist: Intra-articular distal radius fracture appears nondisplaced.  There is some scattered osteoarthritic changes in her wrist.  2 view x-rays left knee: Osteoarthritis but no obvious fracture.   Question whether the lateral tibial plateau might be slightly depressed on the AP view.   Assessment & Plan: 1.  Left wrist pain due to nondisplaced intra-articular distal radius fracture -Radial gutter splint, return next week for examination and short arm cast application.  We will wait for wound healing first.  2.  Left shoulder pain status post fall, probably aggravation of arthritis. -Repeat x-rays in 2 to 3 weeks if pain persists.  3.  Left knee contusion, cannot rule out lateral tibial plateau fracture -Weightbearing as tolerated.  If symptoms persist we will repeat x-rays.   Follow-Up Instructions: No follow-ups on file.     Procedures: No procedures performed   PMFS History: Patient Active Problem List   Diagnosis Date Noted  . Rectal bleeding 02/09/2018  . Hypokalemia 02/09/2018  . Hypotension 02/09/2018  . Lower GI bleed 02/09/2018  . Acute kidney injury ([place]) 02/09/2018  . Acute blood loss anemia 02/09/2018  . Fall 05/07/2017  . Cerebellar stroke ([place]) 03/10/2016  . Carotid aneurysm, right ([place]) 02/21/2016  . Lipoma of shoulder 03/27/2015  . History of closed Colles' fracture 09/06/2014  . Allergic rhinitis 06/20/2014  . Diastolic dysfunction 37/85/8850  . History of GI bleed 03/21/2014  . Dementia 09/01/2013  . Memory loss 09/01/2013  . Anxiety 09/01/2013  . Encounter for Medicare  annual wellness exam 01/08/2013  . Gallstones 11/11/2011  . 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 Risk for falls 06/04/2011  . Constipation 03/05/2011  . Vitamin D deficiency 03/28/2009  . HYPERCHOLESTEROLEMIA, PURE 03/17/2007  . Essential hypertension 03/16/2007  . OSTEOARTHRITIS 03/16/2007  . Osteoporosis 03/16/2007   Past Medical History:  Diagnosis Date  . Asymptomatic gallstones   . BRBPR (bright red blood per rectum)   . Cholelithiasis   . Colitis - presumed infectious origin    one ER visit   . Dementia   . Duodenitis   . Fall   . Gastritis   . GI  bleed   . Hiatal hernia   . Hyperlipidemia   . Hypertension   . Osteoarthritis   . Osteoporosis   . Stroke ([place])   . Syncope     Family History  Problem Relation Age of Onset  . Heart attack Father   . Hypertension Father   . Heart attack Mother   . Throat cancer Brother   . Breast cancer Daughter     Past Surgical History:  Procedure Laterality Date  . ABDOMINAL HYSTERECTOMY     BSO- fibroids  . ABI's     normal  . dexa  1/05   osteoporosis  . ESOPHAGOGASTRODUODENOSCOPY N/A 03/21/2014   Procedure: ESOPHAGOGASTRODUODENOSCOPY (EGD);  Surgeon: Milus Banister, MD;  Location: [facility];  Service: Endoscopy;  Laterality: N/A;  . left foot brace    . WRIST FRACTURE SURGERY  2/01   R arm   Social History   Occupational History  . Occupation: retired    Fish farm manager: RETIRED  Tobacco Use  . Smoking status: Never Smoker  . Smokeless tobacco: Never Used  Substance and Sexual Activity  . Alcohol use: No    Alcohol/week: 0.0 standard drinks  . Drug use: No  . Sexual activity: Never

## 2018-02-13 NOTE — Telephone Encounter (Signed)
Lm requesting return call to complete TCM and confirm hosp f/u appt  

## 2018-02-16 ENCOUNTER — Telehealth (INDEPENDENT_AMBULATORY_CARE_PROVIDER_SITE_OTHER): Payer: Self-pay | Admitting: Radiology

## 2018-02-16 NOTE — Telephone Encounter (Signed)
Yes, let's check the wound and place a cast, also get x-rays of the other areas.  [place]

## 2018-02-16 NOTE — Telephone Encounter (Signed)
Does patient need to followup this week and check the abrasion and change out to a cast?  Also, she is having pretty bad left knee pain still, and I wonder if we need to xray her hip/pelvis. She has the pain really only when she stands up on that left leg/weight bears.   Please advise.

## 2018-02-16 NOTE — Telephone Encounter (Signed)
Lm requesting return call to complete TCM and confirm hosp f/u appt  

## 2018-02-16 NOTE — Telephone Encounter (Signed)
IC and made appt.

## 2018-02-17 ENCOUNTER — Ambulatory Visit (INDEPENDENT_AMBULATORY_CARE_PROVIDER_SITE_OTHER): Payer: Medicare Other | Admitting: Family Medicine

## 2018-02-17 ENCOUNTER — Encounter: Payer: Self-pay | Admitting: Family Medicine

## 2018-02-17 ENCOUNTER — Ambulatory Visit (INDEPENDENT_AMBULATORY_CARE_PROVIDER_SITE_OTHER): Payer: Medicare Other

## 2018-02-17 ENCOUNTER — Encounter (INDEPENDENT_AMBULATORY_CARE_PROVIDER_SITE_OTHER): Payer: Self-pay | Admitting: Family Medicine

## 2018-02-17 VITALS — BP 102/64 | HR 73 | Temp 98.4°F | Ht 60.0 in

## 2018-02-17 DIAGNOSIS — D62 Acute posthemorrhagic anemia: Secondary | ICD-10-CM | POA: Diagnosis not present

## 2018-02-17 DIAGNOSIS — I10 Essential (primary) hypertension: Secondary | ICD-10-CM

## 2018-02-17 DIAGNOSIS — K625 Hemorrhage of anus and rectum: Secondary | ICD-10-CM | POA: Diagnosis not present

## 2018-02-17 DIAGNOSIS — M25552 Pain in left hip: Secondary | ICD-10-CM | POA: Diagnosis not present

## 2018-02-17 DIAGNOSIS — M25562 Pain in left knee: Secondary | ICD-10-CM

## 2018-02-17 DIAGNOSIS — K922 Gastrointestinal hemorrhage, unspecified: Secondary | ICD-10-CM | POA: Diagnosis not present

## 2018-02-17 DIAGNOSIS — K529 Noninfective gastroenteritis and colitis, unspecified: Secondary | ICD-10-CM | POA: Insufficient documentation

## 2018-02-17 DIAGNOSIS — N179 Acute kidney failure, unspecified: Secondary | ICD-10-CM | POA: Diagnosis not present

## 2018-02-17 DIAGNOSIS — I6930 Unspecified sequelae of cerebral infarction: Secondary | ICD-10-CM | POA: Diagnosis not present

## 2018-02-17 DIAGNOSIS — S52572A Other intraarticular fracture of lower end of left radius, initial encounter for closed fracture: Secondary | ICD-10-CM | POA: Diagnosis not present

## 2018-02-17 DIAGNOSIS — S52502A Unspecified fracture of the lower end of left radius, initial encounter for closed fracture: Secondary | ICD-10-CM

## 2018-02-17 DIAGNOSIS — S52502S Unspecified fracture of the lower end of left radius, sequela: Secondary | ICD-10-CM | POA: Diagnosis not present

## 2018-02-17 DIAGNOSIS — Z8781 Personal history of (healed) traumatic fracture: Secondary | ICD-10-CM | POA: Insufficient documentation

## 2018-02-17 LAB — CBC WITH DIFFERENTIAL/PLATELET
Basophils Absolute: 0 10*3/uL (ref 0.0–0.1)
Basophils Relative: 0.8 % (ref 0.0–3.0)
EOS ABS: 0.2 10*3/uL (ref 0.0–0.7)
Eosinophils Relative: 3.3 % (ref 0.0–5.0)
HEMATOCRIT: 33.9 % — AB (ref 36.0–46.0)
HEMOGLOBIN: 11.2 g/dL — AB (ref 12.0–15.0)
LYMPHS PCT: 21.6 % (ref 12.0–46.0)
Lymphs Abs: 1.1 10*3/uL (ref 0.7–4.0)
MCHC: 33.1 g/dL (ref 30.0–36.0)
MCV: 93.5 fl (ref 78.0–100.0)
MONOS PCT: 13.5 % — AB (ref 3.0–12.0)
Monocytes Absolute: 0.7 10*3/uL (ref 0.1–1.0)
NEUTROS ABS: 3 10*3/uL (ref 1.4–7.7)
Neutrophils Relative %: 60.8 % (ref 43.0–77.0)
PLATELETS: 267 10*3/uL (ref 150.0–400.0)
RBC: 3.63 Mil/uL — ABNORMAL LOW (ref 3.87–5.11)
RDW: 14.2 % (ref 11.5–15.5)
WBC: 4.9 10*3/uL (ref 4.0–10.5)

## 2018-02-17 LAB — BASIC METABOLIC PANEL
BUN: 18 mg/dL (ref 6–23)
CHLORIDE: 102 meq/L (ref 96–112)
CO2: 28 mEq/L (ref 19–32)
CREATININE: 1.18 mg/dL (ref 0.40–1.20)
Calcium: 9.6 mg/dL (ref 8.4–10.5)
GFR: 46.12 mL/min — ABNORMAL LOW (ref >60.00)
GLUCOSE: 103 mg/dL — AB (ref 70–99)
POTASSIUM: 3.9 meq/L (ref 3.5–5.1)
Sodium: 137 mEq/L (ref 135–145)

## 2018-02-17 LAB — FERRITIN: FERRITIN: 55.5 ng/mL (ref 10.0–291.0)

## 2018-02-17 NOTE — Assessment & Plan Note (Signed)
Was on plavix-now holding for GI bleed When stable- adv family to d/w her neurologist the pros and cons of re starting it

## 2018-02-17 NOTE — Progress Notes (Signed)
Subjective:    Patient ID: Wendy Krueger, female    DOB: Jul 28, 1929, 84 y.o.   MRN: [id number]  HPI  Here for f/u of hospitalization from 8/12 to 8/16 for GI bleeding (rectal)   TCM call made on 8/16  D/c diag and plan from d/c summary:  Rectal bleeding Presumed hemorrhoidal bleeding Patient had large hemorrhoids on exam.  Other differential included a diverticular bleed, stercoral ulcer, cancer, ischemic colitis, although this regard.  Her hemoglobin remained stable in the hospital, she was evaluated by gastroenterology, arrange for outpatient colonoscopy.  On the day of discharge, she had no further large bleeding, and was in improving health.  Acute blood loss anemia Did not require transfusion.  Hemoglobin dropped to 10, was stable there throughout the rest of her hospitalization. Dementia  Acute kidney injury  Hypertension Cerebrovascular disease secondary prevention Plavix was stopped.  Long-term continuation should be discussed with neurology and PCP.  She should not resume Plavix at the very least until after her colonoscopy early next week.  Shortly after this hospitalization she tried to walk w/o walker and fell  Presented to ortho office with pain in shoulder/wrist and knee on 8/16 Also abrasion  Films done showed OA in shoulder and knees L wrist showed intra-articular distal radius fracture (nondisplaced)  She was put in radial gutter splint to return nexw week for exam/and short arm cast application (after wound healing) That was done this am with Dr Junius Roads (and another f/u next Tuesday)  Will do MRI if leg continues to hurt   Has known hx of OP   Lab Results  Component Value Date   WBC 4.4 02/11/2018   HGB 10.1 (L) 02/11/2018   HCT 31.9 (L) 02/11/2018   MCV 97.0 02/11/2018   PLT 135 (L) 02/11/2018   done on 8/14 Lab Results  Component Value Date   CREATININE 1.03 (H) 02/11/2018   BUN 8 02/11/2018   NA 142 02/11/2018   K 3.8 02/11/2018   CL 114  (H) 02/11/2018   CO2 21 (L) 02/11/2018   stable  Had acute kidney inj on presentation initially   Today : Wt Readings from Last 3 Encounters:  02/12/18 173 lb (78.5 kg)  08/15/17 168 lb 12.8 oz (76.6 kg)  07/23/17 166 lb (75.3 kg)   33.79 kg/m   bp has been on the lower side of normal BP Readings from Last 3 Encounters:  02/17/18 102/64  02/12/18 (!) 160/80  08/15/17 116/70   Started her back on solid food when she came home and her chronic diarrhea  Put her back on fluids/jello/and popcicles  Ate some oatmeal today - did well with it so far  She is hungry   She has DNR status  Has long term care coverage   Uses a belt to get her up and down  Family is struggling to keep her at home   Patient Active Problem List   Diagnosis Date Noted  . Chronic diarrhea 02/17/2018  . Distal radius fracture, left 02/17/2018  . Rectal bleeding 02/09/2018  . Hypokalemia 02/09/2018  . Hypotension 02/09/2018  . Lower GI bleed 02/09/2018  . Acute kidney injury ([place]) 02/09/2018  . Acute blood loss anemia 02/09/2018  . Fall 05/07/2017  . Cerebellar stroke ([place]) 03/10/2016  . Carotid aneurysm, right ([place]) 02/21/2016  . Lipoma of shoulder 03/27/2015  . History of closed Colles' fracture 09/06/2014  . Allergic rhinitis 06/20/2014  . Diastolic dysfunction 54/62/7035  . History of GI bleed  03/21/2014  . Dementia 09/01/2013  . Memory loss 09/01/2013  . Anxiety 09/01/2013  . Encounter for Medicare annual wellness exam 01/08/2013  . Gallstones 11/11/2011  . 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 Risk for falls 06/04/2011  . Constipation 03/05/2011  . Vitamin D deficiency 03/28/2009  . HYPERCHOLESTEROLEMIA, PURE 03/17/2007  . Essential hypertension 03/16/2007  . OSTEOARTHRITIS 03/16/2007  . Osteoporosis 03/16/2007   Past Medical History:  Diagnosis Date  . Asymptomatic gallstones   . BRBPR (bright red blood per rectum)   . Cholelithiasis   . Colitis - presumed infectious  origin    one ER visit   . Dementia   . Duodenitis   . Fall   . Gastritis   . GI bleed   . Hiatal hernia   . Hyperlipidemia   . Hypertension   . Osteoarthritis   . Osteoporosis   . Stroke ([place])   . Syncope    Past Surgical History:  Procedure Laterality Date  . ABDOMINAL HYSTERECTOMY     BSO- fibroids  . ABI's     normal  . dexa  1/05   osteoporosis  . ESOPHAGOGASTRODUODENOSCOPY N/A 03/21/2014   Procedure: ESOPHAGOGASTRODUODENOSCOPY (EGD);  Surgeon: Milus Banister, MD;  Location: [facility];  Service: Endoscopy;  Laterality: N/A;  . left foot brace    . WRIST FRACTURE SURGERY  2/01   R arm   Social History   Tobacco Use  . Smoking status: Never Smoker  . Smokeless tobacco: Never Used  Substance Use Topics  . Alcohol use: No    Alcohol/week: 0.0 standard drinks  . Drug use: No   Family History  Problem Relation Age of Onset  . Heart attack Father   . Hypertension Father   . Heart attack Mother   . Throat cancer Brother   . Breast cancer Daughter    Allergies  Allergen Reactions  . Alendronate Sodium Other (See Comments)    Leg pain   . Amlodipine Besylate Hives  . Calcitonin ([place]) Other (See Comments)     headache/ head pressure  . Simvastatin Other (See Comments)    Leg pain    Current Outpatient Medications on File Prior to Visit  Medication Sig Dispense Refill  . Cholecalciferol (VITAMIN D-3) 1000 units CAPS Take 1 capsule See admin instructions by mouth. Take 1000 units in the morning and 2000 in the evening    . donepezil (ARICEPT) 10 MG tablet TAKE 1 TABLET (10 MG TOTAL) BY MOUTH AT BEDTIME. 30 tablet 11  . Fexofenadine HCl (ALLERGY 24-HR PO) Take 1 capsule by mouth daily.    . hydrochlorothiazide (HYDRODIURIL) 25 MG tablet Take 1 tablet (25 mg total) by mouth daily. 90 tablet 3  . hydrocortisone (ANUSOL-HC) 2.5 % rectal cream Place rectally 3 (three) times daily. 30 g 0  . lisinopril (PRINIVIL,ZESTRIL) 20 MG tablet Take 1 tablet (20 mg total)  by mouth daily. 90 tablet 3  . loperamide (IMODIUM A-D) 2 MG tablet Take 2 mg by mouth 4 (four) times daily as needed for diarrhea or loose stools.    . memantine (NAMENDA) 10 MG tablet TAKE 1 TABLET (10 MG TOTAL) BY MOUTH 2 (TWO) TIMES DAILY. 60 tablet 11  . metoprolol tartrate (LOPRESSOR) 25 MG tablet Take 1 tablet (25 mg total) by mouth 2 (two) times daily. 180 tablet 1  . Multiple Vitamins-Minerals ([employer] SPECTRAVITE PO) Take 1 capsule by mouth daily.    . potassium chloride (KLOR-CON M10) 10 MEQ tablet Take  2 tablets (20 mEq total) by mouth daily. (Patient taking differently: Take 20 mEq at bedtime by mouth. ) 180 tablet 3  . vitamin B-12 (CYANOCOBALAMIN) 1000 MCG tablet Take 1,000 mcg by mouth daily.     No current facility-administered medications on file prior to visit.      Review of Systems  Constitutional: Positive for fatigue. Negative for activity change, appetite change, fever and unexpected weight change.  HENT: Negative for congestion, ear pain, rhinorrhea, sinus pressure and sore throat.   Eyes: Negative for pain, redness and visual disturbance.  Respiratory: Negative for cough, shortness of breath and wheezing.   Cardiovascular: Negative for chest pain and palpitations.  Gastrointestinal: Positive for diarrhea. Negative for abdominal pain, blood in stool, constipation, rectal pain and vomiting.  Endocrine: Negative for polydipsia and polyuria.  Genitourinary: Negative for dysuria, frequency and urgency.  Musculoskeletal: Positive for arthralgias and joint swelling. Negative for back pain and myalgias.  Skin: Negative for pallor and rash.  Allergic/Immunologic: Negative for environmental allergies.  Neurological: Negative for dizziness, syncope, light-headedness and headaches.  Hematological: Negative for adenopathy. Does not bruise/bleed easily.  Psychiatric/Behavioral: Positive for confusion. Negative for decreased concentration and dysphoric mood. The patient is not  nervous/anxious.        Dementia        Objective:   Physical Exam  Constitutional: She appears well-developed and well-nourished. No distress.  Frail appearing elderly female with dementia, in wheelchair c/o body pain from fall (cast on L arm)   HENT:  Head: Normocephalic and atraumatic.  Nose: Nose normal.  Mouth/Throat: Oropharynx is clear and moist.  Eyes: Pupils are equal, round, and reactive to light. Conjunctivae and EOM are normal.  Neck: Normal range of motion. Neck supple. No JVD present. Carotid bruit is not present. No tracheal deviation present. No thyromegaly present.  Cardiovascular: Normal rate, regular rhythm, normal heart sounds and intact distal pulses. Exam reveals no gallop.  Pulmonary/Chest: Effort normal and breath sounds normal. No stridor. No respiratory distress. She has no wheezes. She has no rales.  No crackles  Abdominal: Soft. Bowel sounds are normal. She exhibits no distension, no abdominal bruit and no mass. There is no tenderness.  Musculoskeletal: She exhibits no edema or deformity.  Cast on L arm   Poor rom of L knee/hip due to discomfort  Unable to stand w/o assistance  Lymphadenopathy:    She has no cervical adenopathy.  Neurological: She is alert. She has normal reflexes. No cranial nerve deficit. Coordination normal.  Skin: Skin is warm and dry. No rash noted.  No jaundice  Psychiatric: Cognition and memory are impaired. She exhibits abnormal recent memory.  Baseline dementia  Tearful at times due to pain and general fatigue   Supportive family present           Assessment & Plan:   Problem List Items Addressed This Visit      Cardiovascular and Mediastinum   Arterial ischemic stroke, chronic    Was on plavix-now holding for GI bleed When stable- adv family to d/w her neurologist the pros and cons of re starting it       Essential hypertension    BP: 102/64  Back on baseline meds after hospitalization  Disc parameters re: low  bp to call for or hold medicine  Family voiced understanding       Relevant Orders   Basic metabolic panel     Digestive   Chronic diarrhea    Causing hemorrhoids and s/p  hosp for GI bleed Reviewed hospital records, lab results and studies in detail   Family has been giving pt clear liquids so far  Disc gradual adv to bland diet (no seeds and not heavy in fats)  Not stable enough for colonoscopy at current time  Will ref to GI for help with this and further advisement       Relevant Orders   Ambulatory referral to Gastroenterology   Basic metabolic panel   Lower GI bleed - Primary    Reviewed hospital records, lab results and studies in detail   May be hemorrhoidal -unsure  Pt has chronic diarrhea that makes hemorrhoids worse  Currently stable Cbc and bmet today  Ref to GI Not stable enough for colonoscopy that was planned due to recent fall /injuries and arm fx  Holding plavix -will continue to hold       Relevant Orders   CBC with Differential/Platelet   Ferritin   Rectal bleeding    Reviewed hospital records, lab results and studies in detail   Hemorrhoidal or other  Not ready for colonoscopy  Ref to GI to discuss management and future plan         Musculoskeletal and Integument   Distal radius fracture, left    Continue f/u with ortho (next in 1 week) for this and other injuries  Mobility issues are issue as well  ? When safe for PT/ HH        Genitourinary   Acute kidney injury ([place])    At hosp for GI bleed Re check bmp today Taking in good fluids now        Other   Acute blood loss anemia    Reviewed hospital records, lab results and studies in detail   Lower GI bleed Cbc today  Last Hb 10.1

## 2018-02-17 NOTE — Assessment & Plan Note (Signed)
Reviewed hospital records, lab results and studies in detail   Lower GI bleed Cbc today  Last Hb 10.1

## 2018-02-17 NOTE — Assessment & Plan Note (Signed)
BP: 102/64  Back on baseline meds after hospitalization  Disc parameters re: low bp to call for or hold medicine  Family voiced understanding

## 2018-02-17 NOTE — Assessment & Plan Note (Signed)
At hosp for GI bleed Re check bmp today Taking in good fluids now

## 2018-02-17 NOTE — Assessment & Plan Note (Signed)
Reviewed hospital records, lab results and studies in detail   May be hemorrhoidal -unsure  Pt has chronic diarrhea that makes hemorrhoids worse  Currently stable Cbc and bmet today  Ref to GI Not stable enough for colonoscopy that was planned due to recent fall /injuries and arm fx  Holding plavix -will continue to hold

## 2018-02-17 NOTE — Assessment & Plan Note (Signed)
Continue f/u with ortho (next in 1 week) for this and other injuries  Mobility issues are issue as well  ? When safe for PT/ [facility]

## 2018-02-17 NOTE — Assessment & Plan Note (Signed)
Causing hemorrhoids and s/p hosp for GI bleed Reviewed hospital records, lab results and studies in detail   Family has been giving pt clear liquids so far  Disc gradual adv to bland diet (no seeds and not heavy in fats)  Not stable enough for colonoscopy at current time  Will ref to GI for help with this and further advisement

## 2018-02-17 NOTE — Patient Instructions (Addendum)
I do not like BP under 90/60 or a little higher but with dizziness   Slowly advance a bland diet - stay away from seeds/nuts/ popcorn and fatty or heavy foods   I think a follow up with GI doctor for a consult is a good idea - I will do a referral   Talk to orthopedics about other things to help mobility and transferring (such as equipment or home health physical therapy)

## 2018-02-17 NOTE — Progress Notes (Signed)
Office Visit Note   Patient: Wendy Krueger           Date of Birth: 10/26/1929           MRN: [id number] Visit Date: 02/17/2018 Requested by: Tower, Wynelle Fanny, MD [place], [place] [address] PCP: Abner Greenspan, MD  Subjective: Chief Complaint  Patient presents with  . Left Wrist - Pain, Follow-up  . Left Leg - Pain, Follow-up    HPI: She is now about 5 or 6 days status post fall here for follow-up of left wrist distal radius fracture and arm abrasion.  Wound is healing well, still having pain in the wrist.  She is still unable to bear weight on her left leg.  She seems to hurt whenever the knee is moved or the hip.  No complaints of pain at rest.               ROS: Otherwise noncontributory.  Objective: Vital Signs: There were no vitals taken for this visit.  Physical Exam:  Left wrist: Abrasions are healing well with no sign of infection.  Remains tender at the distal radius.  Swelling is minimal. Left leg: Internal hip rotation seems to be very painful today.  Left knee has no effusion but is tender on the medial tibial plateau.  Imaging: Pelvis and lateral left hip x-rays show mild arthritic changes, no obvious femoral neck fracture.  Question whether she might have a nondisplaced pubic ramus fracture but only on the AP view.  I do not see any abnormality on the lateral view.  2 view x-rays left knee show no obvious fracture.   Assessment & Plan: 1.  Left wrist distal radius fracture, clinically stable -Short arm cast, x-rays in 1-2 weeks.  2.  Left leg pain, etiology uncertain.  Still cannot completely rule out occult hip fracture. -Weightbearing as tolerated.  We will have a low threshold to order MRI of the left hip if not able to bear weight next week.   Follow-Up Instructions: No follow-ups on file.     Procedures: None   PMFS History: Patient Active Problem List   Diagnosis Date Noted  . Rectal bleeding 02/09/2018  . Hypokalemia  02/09/2018  . Hypotension 02/09/2018  . Lower GI bleed 02/09/2018  . Acute kidney injury ([place]) 02/09/2018  . Acute blood loss anemia 02/09/2018  . Fall 05/07/2017  . Cerebellar stroke ([place]) 03/10/2016  . Carotid aneurysm, right ([place]) 02/21/2016  . Lipoma of shoulder 03/27/2015  . History of closed Colles' fracture 09/06/2014  . Allergic rhinitis 06/20/2014  . Diastolic dysfunction 27/11/2374  . History of GI bleed 03/21/2014  . Dementia 09/01/2013  . Memory loss 09/01/2013  . Anxiety 09/01/2013  . Encounter for Medicare annual wellness exam 01/08/2013  . Gallstones 11/11/2011  . 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 Risk for falls 06/04/2011  . Constipation 03/05/2011  . Vitamin D deficiency 03/28/2009  . HYPERCHOLESTEROLEMIA, PURE 03/17/2007  . Essential hypertension 03/16/2007  . OSTEOARTHRITIS 03/16/2007  . Osteoporosis 03/16/2007   Past Medical History:  Diagnosis Date  . Asymptomatic gallstones   . BRBPR (bright red blood per rectum)   . Cholelithiasis   . Colitis - presumed infectious origin    one ER visit   . Dementia   . Duodenitis   . Fall   . Gastritis   . GI bleed   . Hiatal hernia   . Hyperlipidemia   . Hypertension   .  Osteoarthritis   . Osteoporosis   . Stroke ([place])   . Syncope     Family History  Problem Relation Age of Onset  . Heart attack Father   . Hypertension Father   . Heart attack Mother   . Throat cancer Brother   . Breast cancer Daughter     Past Surgical History:  Procedure Laterality Date  . ABDOMINAL HYSTERECTOMY     BSO- fibroids  . ABI's     normal  . dexa  1/05   osteoporosis  . ESOPHAGOGASTRODUODENOSCOPY N/A 03/21/2014   Procedure: ESOPHAGOGASTRODUODENOSCOPY (EGD);  Surgeon: Milus Banister, MD;  Location: [facility];  Service: Endoscopy;  Laterality: N/A;  . left foot brace    . WRIST FRACTURE SURGERY  2/01   R arm   Social History   Occupational History  . Occupation: retired    Fish farm manager: RETIRED    Tobacco Use  . Smoking status: Never Smoker  . Smokeless tobacco: Never Used  Substance and Sexual Activity  . Alcohol use: No    Alcohol/week: 0.0 standard drinks  . Drug use: No  . Sexual activity: Never

## 2018-02-17 NOTE — Assessment & Plan Note (Signed)
Reviewed hospital records, lab results and studies in detail   Hemorrhoidal or other  Not ready for colonoscopy  Ref to GI to discuss management and future plan

## 2018-02-18 ENCOUNTER — Encounter: Payer: Medicare Other | Admitting: Gastroenterology

## 2018-02-24 ENCOUNTER — Ambulatory Visit (INDEPENDENT_AMBULATORY_CARE_PROVIDER_SITE_OTHER): Payer: Medicare Other

## 2018-02-24 ENCOUNTER — Encounter (INDEPENDENT_AMBULATORY_CARE_PROVIDER_SITE_OTHER): Payer: Self-pay | Admitting: Family Medicine

## 2018-02-24 ENCOUNTER — Ambulatory Visit (INDEPENDENT_AMBULATORY_CARE_PROVIDER_SITE_OTHER): Payer: Medicare Other | Admitting: Family Medicine

## 2018-02-24 DIAGNOSIS — S52572A Other intraarticular fracture of lower end of left radius, initial encounter for closed fracture: Secondary | ICD-10-CM | POA: Diagnosis not present

## 2018-02-24 DIAGNOSIS — R2681 Unsteadiness on feet: Secondary | ICD-10-CM | POA: Diagnosis not present

## 2018-02-24 DIAGNOSIS — M25552 Pain in left hip: Secondary | ICD-10-CM | POA: Diagnosis not present

## 2018-02-24 NOTE — Progress Notes (Signed)
Office Visit Note   Patient: Wendy Krueger           Date of Birth: 05-19-1930           MRN: [id number] Visit Date: 02/24/2018 Requested by: Tower, Wynelle Fanny, MD [place], [place] [address] PCP: Abner Greenspan, MD  Subjective: Chief Complaint  Patient presents with  . Left Wrist - Follow-up  . Left Leg - Pain    HPI: She is here for follow-up.  She is about 2 weeks status post fall resulting in left wrist intra-articular distal radius fracture.  She is in her short arm cast, no complaints regarding her wrist.  She still complains of left knee pain, but her family states that she has been able to put some weight on her leg.  She seems to be improving but she is still wheelchair-bound most of the time.               ROS: Otherwise negative.  Objective: Vital Signs: There were no vitals taken for this visit.  Physical Exam:  Left wrist: Cast is intact, she is neurovascularly intact.  Cast was not removed today. Left leg: Today she has minimal discomfort with passive hip flexion and internal rotation.  She has 1+ effusion of the left knee with full active extension.  Slightly tender along the medial and lateral joint line but much less than before.   Imaging: 2 view x-rays left wrist: These were taken through the cast.  Fracture alignment looks unchanged from initial x-rays.  It appears that she has some early callus formation.   Assessment & Plan: 1.  Stable 2 weeks status post intra-articular left wrist distal radius fracture -Continue in short arm cast for 2 more weeks.  Return at that point for two-view x-rays through the cast to assess fracture healing.  If adequate healing we will discontinue the cast and switch to a removable splint.  She will likely need therapy to help restore range of motion and to decrease pain so that she can use her walker again.  She may need modifications to her walker.  2.  Left leg pain, possibly due to knee  osteoarthritis -She is clinically improving.  We will request home health physical therapy for range of motion and strengthening, attempting to decrease future fall risk.    Follow-Up Instructions: Return in about 2 weeks (around 03/10/2018).     Procedures: None today.   PMFS History: Patient Active Problem List   Diagnosis Date Noted  . Chronic diarrhea 02/17/2018  . Distal radius fracture, left 02/17/2018  . Rectal bleeding 02/09/2018  . Hypokalemia 02/09/2018  . Hypotension 02/09/2018  . Lower GI bleed 02/09/2018  . Acute kidney injury ([place]) 02/09/2018  . Acute blood loss anemia 02/09/2018  . Fall 05/07/2017  . Cerebellar stroke ([place]) 03/10/2016  . Carotid aneurysm, right ([place]) 02/21/2016  . Lipoma of shoulder 03/27/2015  . History of closed Colles' fracture 09/06/2014  . Allergic rhinitis 06/20/2014  . Diastolic dysfunction 89/37/3428  . History of GI bleed 03/21/2014  . Dementia 09/01/2013  . Memory loss 09/01/2013  . Anxiety 09/01/2013  . Encounter for Medicare annual wellness exam 01/08/2013  . Gallstones 11/11/2011  . 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 Risk for falls 06/04/2011  . Constipation 03/05/2011  . Vitamin D deficiency 03/28/2009  . HYPERCHOLESTEROLEMIA, PURE 03/17/2007  . Essential hypertension 03/16/2007  . OSTEOARTHRITIS 03/16/2007  . Osteoporosis 03/16/2007   Past Medical History:  Diagnosis Date  . Asymptomatic gallstones   . BRBPR (bright red blood per rectum)   . Cholelithiasis   . Colitis - presumed infectious origin    one ER visit   . Dementia   . Duodenitis   . Fall   . Gastritis   . GI bleed   . Hiatal hernia   . Hyperlipidemia   . Hypertension   . Osteoarthritis   . Osteoporosis   . Stroke ([place])   . Syncope     Family History  Problem Relation Age of Onset  . Heart attack Father   . Hypertension Father   . Heart attack Mother   . Throat cancer Brother   . Breast cancer Daughter     Past Surgical  History:  Procedure Laterality Date  . ABDOMINAL HYSTERECTOMY     BSO- fibroids  . ABI's     normal  . dexa  1/05   osteoporosis  . ESOPHAGOGASTRODUODENOSCOPY N/A 03/21/2014   Procedure: ESOPHAGOGASTRODUODENOSCOPY (EGD);  Surgeon: Milus Banister, MD;  Location: [facility];  Service: Endoscopy;  Laterality: N/A;  . left foot brace    . WRIST FRACTURE SURGERY  2/01   R arm   Social History   Occupational History  . Occupation: retired    Fish farm manager: RETIRED  Tobacco Use  . Smoking status: Never Smoker  . Smokeless tobacco: Never Used  Substance and Sexual Activity  . Alcohol use: No    Alcohol/week: 0.0 standard drinks  . Drug use: No  . Sexual activity: Never

## 2018-02-27 ENCOUNTER — Ambulatory Visit (INDEPENDENT_AMBULATORY_CARE_PROVIDER_SITE_OTHER): Payer: Medicare Other | Admitting: Family Medicine

## 2018-02-28 DIAGNOSIS — Z8673 Personal history of transient ischemic attack (TIA), and cerebral infarction without residual deficits: Secondary | ICD-10-CM | POA: Diagnosis not present

## 2018-02-28 DIAGNOSIS — M1991 Primary osteoarthritis, unspecified site: Secondary | ICD-10-CM | POA: Diagnosis not present

## 2018-02-28 DIAGNOSIS — R2681 Unsteadiness on feet: Secondary | ICD-10-CM | POA: Diagnosis not present

## 2018-02-28 DIAGNOSIS — M1712 Unilateral primary osteoarthritis, left knee: Secondary | ICD-10-CM | POA: Diagnosis not present

## 2018-02-28 DIAGNOSIS — M25552 Pain in left hip: Secondary | ICD-10-CM | POA: Diagnosis not present

## 2018-02-28 DIAGNOSIS — M810 Age-related osteoporosis without current pathological fracture: Secondary | ICD-10-CM | POA: Diagnosis not present

## 2018-02-28 DIAGNOSIS — I5189 Other ill-defined heart diseases: Secondary | ICD-10-CM | POA: Diagnosis not present

## 2018-02-28 DIAGNOSIS — S52572D Other intraarticular fracture of lower end of left radius, subsequent encounter for closed fracture with routine healing: Secondary | ICD-10-CM | POA: Diagnosis not present

## 2018-02-28 DIAGNOSIS — F0390 Unspecified dementia without behavioral disturbance: Secondary | ICD-10-CM | POA: Diagnosis not present

## 2018-02-28 DIAGNOSIS — Z9181 History of falling: Secondary | ICD-10-CM | POA: Diagnosis not present

## 2018-03-03 ENCOUNTER — Telehealth (INDEPENDENT_AMBULATORY_CARE_PROVIDER_SITE_OTHER): Payer: Self-pay | Admitting: Family Medicine

## 2018-03-03 NOTE — Telephone Encounter (Signed)
Please advise 

## 2018-03-03 NOTE — Telephone Encounter (Signed)
Jess (PT)  Called needing verbal orders for HHPT 1 Wk 1 and 2 wk 3. The number to contact Jess is [phone]

## 2018-03-03 NOTE — Telephone Encounter (Signed)
Ok to give verbal orders?

## 2018-03-04 NOTE — Telephone Encounter (Signed)
Verbal orders for HHPT 1x wee for 1 week and 2x week for 3 weeks given to [place].  He also asked if it would be ok to add an arm attachment to the walker since she has a cast on the left lower arm - ok per Dr. Junius Roads.

## 2018-03-05 DIAGNOSIS — M25552 Pain in left hip: Secondary | ICD-10-CM | POA: Diagnosis not present

## 2018-03-05 DIAGNOSIS — R2681 Unsteadiness on feet: Secondary | ICD-10-CM | POA: Diagnosis not present

## 2018-03-05 DIAGNOSIS — M1712 Unilateral primary osteoarthritis, left knee: Secondary | ICD-10-CM | POA: Diagnosis not present

## 2018-03-05 DIAGNOSIS — S52572D Other intraarticular fracture of lower end of left radius, subsequent encounter for closed fracture with routine healing: Secondary | ICD-10-CM | POA: Diagnosis not present

## 2018-03-05 DIAGNOSIS — F0390 Unspecified dementia without behavioral disturbance: Secondary | ICD-10-CM | POA: Diagnosis not present

## 2018-03-05 DIAGNOSIS — M810 Age-related osteoporosis without current pathological fracture: Secondary | ICD-10-CM | POA: Diagnosis not present

## 2018-03-06 DIAGNOSIS — M1712 Unilateral primary osteoarthritis, left knee: Secondary | ICD-10-CM | POA: Diagnosis not present

## 2018-03-06 DIAGNOSIS — R2681 Unsteadiness on feet: Secondary | ICD-10-CM | POA: Diagnosis not present

## 2018-03-06 DIAGNOSIS — M25552 Pain in left hip: Secondary | ICD-10-CM | POA: Diagnosis not present

## 2018-03-06 DIAGNOSIS — F0390 Unspecified dementia without behavioral disturbance: Secondary | ICD-10-CM | POA: Diagnosis not present

## 2018-03-06 DIAGNOSIS — S52572D Other intraarticular fracture of lower end of left radius, subsequent encounter for closed fracture with routine healing: Secondary | ICD-10-CM | POA: Diagnosis not present

## 2018-03-06 DIAGNOSIS — M810 Age-related osteoporosis without current pathological fracture: Secondary | ICD-10-CM | POA: Diagnosis not present

## 2018-03-09 ENCOUNTER — Telehealth: Payer: Self-pay | Admitting: Family Medicine

## 2018-03-09 NOTE — Telephone Encounter (Signed)
Copied from [place] [id number]. Topic: Quick Communication - See Telephone Encounter >> Mar 09, 2018  2:22 PM Antonieta Iba C wrote: CRM for notification. See Telephone encounter for: 03/09/18.  Pt's daughter [name] called in to be advised. Pt is constipated, daughter would like to know suggestions on what they could do?  CB: [place] (pt's other daughter that is there with pt)

## 2018-03-10 ENCOUNTER — Ambulatory Visit: Payer: Self-pay | Admitting: Family Medicine

## 2018-03-10 ENCOUNTER — Ambulatory Visit (INDEPENDENT_AMBULATORY_CARE_PROVIDER_SITE_OTHER): Payer: Medicare Other | Admitting: Family Medicine

## 2018-03-10 ENCOUNTER — Ambulatory Visit (INDEPENDENT_AMBULATORY_CARE_PROVIDER_SITE_OTHER): Payer: Medicare Other

## 2018-03-10 ENCOUNTER — Encounter (INDEPENDENT_AMBULATORY_CARE_PROVIDER_SITE_OTHER): Payer: Self-pay | Admitting: Family Medicine

## 2018-03-10 VITALS — BP 141/81 | HR 69

## 2018-03-10 DIAGNOSIS — F0390 Unspecified dementia without behavioral disturbance: Secondary | ICD-10-CM | POA: Diagnosis not present

## 2018-03-10 DIAGNOSIS — S52572A Other intraarticular fracture of lower end of left radius, initial encounter for closed fracture: Secondary | ICD-10-CM

## 2018-03-10 DIAGNOSIS — M1712 Unilateral primary osteoarthritis, left knee: Secondary | ICD-10-CM | POA: Diagnosis not present

## 2018-03-10 DIAGNOSIS — M25552 Pain in left hip: Secondary | ICD-10-CM | POA: Diagnosis not present

## 2018-03-10 DIAGNOSIS — M25562 Pain in left knee: Secondary | ICD-10-CM

## 2018-03-10 DIAGNOSIS — M810 Age-related osteoporosis without current pathological fracture: Secondary | ICD-10-CM | POA: Diagnosis not present

## 2018-03-10 DIAGNOSIS — R2681 Unsteadiness on feet: Secondary | ICD-10-CM

## 2018-03-10 DIAGNOSIS — S52572D Other intraarticular fracture of lower end of left radius, subsequent encounter for closed fracture with routine healing: Secondary | ICD-10-CM | POA: Diagnosis not present

## 2018-03-10 NOTE — Progress Notes (Signed)
Office Visit Note   Patient: Wendy Krueger           Date of Birth: 12-11-1929           MRN: [id number] Visit Date: 03/10/2018 Requested by: Tower, Wynelle Fanny, MD [place], [place] [address] PCP: Abner Greenspan, MD  Subjective: Chief Complaint  Patient presents with  . Left Wrist - Follow-up    HPI: She is about a month status post left wrist distal radius fracture.  Doing well in her cast.  No complaints of pain.  Her leg seems to be much better.  She is bearing full weight, does not seem to be in any pain.  She is working with physical therapy on gait stability.              ROS: Otherwise noncontributory.  Objective: Vital Signs: BP (!) 141/81 (BP Location: Right Arm, Patient Position: Sitting)   Pulse 69   Physical Exam:  Left wrist: Cast is intact, she is neurovascularly intact.  Full range of motion of the elbow and fingers with no apparent pain. Left leg: Knee flexion and extension are normal and pain-free.  No tenderness to palpation around her knee.  Imaging: 2 view left wrist x-rays: These were taken through the cast showing no change in fracture alignment.  There is probably some callus formation present.  Assessment & Plan: 1.  Clinically healing 1 month status post left wrist distal radius fracture. -Return in 2 weeks for cast removal and 2 view x-rays.  Probably switch to a removable splint for the duration of healing at that point. -Her home physical therapist will arrange for a platform for her walker for the duration of her healing.  2.  Left leg pain, seems to be resolved.   Follow-Up Instructions: Return in about 2 weeks (around 03/24/2018).     Procedures: None today.   PMFS History: Patient Active Problem List   Diagnosis Date Noted  . Chronic diarrhea 02/17/2018  . Distal radius fracture, left 02/17/2018  . Rectal bleeding 02/09/2018  . Hypokalemia 02/09/2018  . Hypotension 02/09/2018  . Lower GI bleed 02/09/2018  .  Acute kidney injury ([place]) 02/09/2018  . Acute blood loss anemia 02/09/2018  . Fall 05/07/2017  . Cerebellar stroke ([place]) 03/10/2016  . Carotid aneurysm, right ([place]) 02/21/2016  . Lipoma of shoulder 03/27/2015  . History of closed Colles' fracture 09/06/2014  . Allergic rhinitis 06/20/2014  . Diastolic dysfunction 48/54/6270  . History of GI bleed 03/21/2014  . Dementia 09/01/2013  . Memory loss 09/01/2013  . Anxiety 09/01/2013  . Encounter for Medicare annual wellness exam 01/08/2013  . Gallstones 11/11/2011  . 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 Risk for falls 06/04/2011  . Constipation 03/05/2011  . Vitamin D deficiency 03/28/2009  . HYPERCHOLESTEROLEMIA, PURE 03/17/2007  . Essential hypertension 03/16/2007  . OSTEOARTHRITIS 03/16/2007  . Osteoporosis 03/16/2007   Past Medical History:  Diagnosis Date  . Asymptomatic gallstones   . BRBPR (bright red blood per rectum)   . Cholelithiasis   . Colitis - presumed infectious origin    one ER visit   . Dementia   . Duodenitis   . Fall   . Gastritis   . GI bleed   . Hiatal hernia   . Hyperlipidemia   . Hypertension   . Osteoarthritis   . Osteoporosis   . Stroke ([place])   . Syncope     Family History  Problem Relation  Age of Onset  . Heart attack Father   . Hypertension Father   . Heart attack Mother   . Throat cancer Brother   . Breast cancer Daughter     Past Surgical History:  Procedure Laterality Date  . ABDOMINAL HYSTERECTOMY     BSO- fibroids  . ABI's     normal  . dexa  1/05   osteoporosis  . ESOPHAGOGASTRODUODENOSCOPY N/A 03/21/2014   Procedure: ESOPHAGOGASTRODUODENOSCOPY (EGD);  Surgeon: Milus Banister, MD;  Location: [place];  Service: Endoscopy;  Laterality: N/A;  . left foot brace    . WRIST FRACTURE SURGERY  2/01   R arm   Social History   Occupational History  . Occupation: retired    Fish farm manager: RETIRED  Tobacco Use  . Smoking status: Never Smoker  . Smokeless tobacco:  Never Used  Substance and Sexual Activity  . Alcohol use: No    Alcohol/week: 0.0 standard drinks  . Drug use: No  . Sexual activity: Never

## 2018-03-10 NOTE — Telephone Encounter (Signed)
See nurse triage encounter.

## 2018-03-10 NOTE — Telephone Encounter (Signed)
Message left to call back and discuss constipation issue.

## 2018-03-10 NOTE — Telephone Encounter (Signed)
[name], the daughter (on the [place]) called in for her mother.    \ Mother is having problems with constipation.   She is passing hard balls of stool and straining to do it.   She was recently in the hospital for diarrhea and rectal bleeding per [name].   She has been taking Imodium so now she is "stopped up".     Sunday is the only day she did not pass any stool. She has an appt tomorrow 03/11/18, with the GI doctor.   I advised them not to take laxatives or use an enema until they have been seen by the GI doctor.    I went over the home care advice with [name] to use with her mother.    Her mother is drinking prune juice.   I encouraged mobility if possible, and warm fluids are helpful along with the care advice.   Reason for Disposition . Rectal pain  Answer Assessment - Initial Assessment Questions 1. STOOL PATTERN OR FREQUENCY: "How often do you pass bowel movements (BMs)?"  (Normal range: tid to q 3 days)  "When was the last BM passed?"       Daughter [name] calling in.   Was recently in hospital with diarrhea and rectal bleeding.   She was getting Imodium and now she is "stopped up".   She has gone a couple of times today but the stools are hard.  She is drinking prune juice and she took Senokot on Sunday.   2. STRAINING: "Do you have to strain to have a BM?"      She is straining.   Yesterday she was crying trying to strain to get the stool out.    She has an appt with the GI doctor tomorrow.    Sunday is only day she did not pass any stool.    She is passing hard balls of stool every day except Sunday so she is going but the stool is so hard and she is straining to get it out. 3. RECTAL PAIN: "Does your rectum hurt when the stool comes out?" If so, ask: "Do you have hemorrhoids? How bad is the pain?"  (Scale 1-10; or mild, moderate, severe)     Pain when she goes.    She has a huge hemorrhoid that is flared up from all the straining.  4. STOOL COMPOSITION: "Are the stools hard?"      Yes like  rocks. 5. BLOOD ON STOOLS: "Has there been any blood on the toilet tissue or on the surface of the BM?" If so, ask: "When was the last time?"      No blood  6. CHRONIC CONSTIPATION: "Is this a new problem for you?"  If no, ask: "How long have you had this problem?" (days, weeks, months)      Yes since coming out of the hospital and she was taking Imodium. 7. CHANGES IN DIET: "Have there been any recent changes in your diet?"      No other than being in the hospital. 8. MEDICATIONS: "Have you been taking any new medications?"     No.   [name] is having her drink prune juice. 9. LAXATIVES: "Have you been using any laxatives or enemas?"  If yes, ask "What, how often, and when was the last time?"     No.   Had Senokot, a stool softener on Sunday only. 10. CAUSE: "What do you think is causing the constipation?"  She was taking Imodium because she was having diarrhea which is why she was in the hospital and now she is "stopped up". 11. OTHER SYMPTOMS: "Do you have any other symptoms?" (e.g., abdominal pain, fever, vomiting)       No 12. PREGNANCY: "Is there any chance you are pregnant?" "When was your last menstrual period?"       Not asked due to age.  Protocols used: CONSTIPATION-A-AH

## 2018-03-11 ENCOUNTER — Ambulatory Visit (INDEPENDENT_AMBULATORY_CARE_PROVIDER_SITE_OTHER): Payer: Medicare Other | Admitting: Nurse Practitioner

## 2018-03-11 ENCOUNTER — Encounter

## 2018-03-11 ENCOUNTER — Encounter: Payer: Self-pay | Admitting: Nurse Practitioner

## 2018-03-11 ENCOUNTER — Other Ambulatory Visit: Payer: Self-pay | Admitting: Adult Health

## 2018-03-11 VITALS — BP 100/60 | HR 84 | Ht 60.0 in | Wt 167.0 lb

## 2018-03-11 DIAGNOSIS — K625 Hemorrhage of anus and rectum: Secondary | ICD-10-CM

## 2018-03-11 DIAGNOSIS — I6381 Other cerebral infarction due to occlusion or stenosis of small artery: Secondary | ICD-10-CM | POA: Diagnosis not present

## 2018-03-11 DIAGNOSIS — K649 Unspecified hemorrhoids: Secondary | ICD-10-CM | POA: Diagnosis not present

## 2018-03-11 NOTE — Patient Instructions (Signed)
If you are age 82 or older, your body mass index should be between 23-30. Your Body mass index is 32.61 kg/m. If this is out of the aforementioned range listed, please consider follow up with your Primary Care Provider.  If you are age 56 or younger, your body mass index should be between 19-25. Your Body mass index is 32.61 kg/m. If this is out of the aformentioned range listed, please consider follow up with your Primary Care Provider.   Use Glycerin Suppositories as needed.  Use Miralax daily as needed.  Thank you for choosing me and [facility].   Tye Savoy, NP

## 2018-03-11 NOTE — Progress Notes (Signed)
Primary GI:  Oretha Caprice, MD   Chief Complaint: hemorrhoids  Referring Provider:     Loura Pardon, MD   ASSESSMENT AND PLAN;   56. 84 yo female admitted mid Aug with low volume, painless rectal bleeding on plavix. Bleeding fel to to be secondary to hemorrhoids found on exam. Bleeding stopped off plavix, no major change in hgb. Colonscopy wasn't pursued given advanced age / co-morbidities. No further bleeding. Hgb stable at 11.2, normal MCV -plavix still on hold. Family to discuss pros and cons of restarting plavix with Neurology -no plans for colonoscopy at this point based still on her advanced age / co-morbidities. -If plavix must be restarted and she has significant bleeding then may need to revisit colonoscopy and / or hemorrhoidal banding -Both constipation and diarrhea can aggravate hemorrhoids. For now recommend she use glycerin supp as needed to avoid straining. She can continue prn Miralax as well. Try to avoid overuse of imodium. Unfortunately she also has chronic intermittent diarrhea which could be med related ( ? Aricept)  2. Left wrist distal radius fracture - sustained after fall following hospital discharge. Cast in place for another few weeks.  4. Dementia, on Aricept and [place]ameda  HPI:    Patient is an 84 year old female with multiple medical problems not limited to history of CVA on chronic Plavix, CKD3, mild dementia, [place] of colon cancer in brothers (in their 74's), and hx of C. difficile colitis.  Patient was hospitalized mid August for  AKI and painless, low volume hematochezia on Plavix. She had hard hemorrhoids on exam, felt to be source of bleed and treated with with topical steroids.   Plavix was placed on hold, bleeding quickly resolved.  Patient has never had a colonoscopy but given advanced age and comorbidities we decided not to one.  No major drop in hgb with the bleeding. Plavix is still on hold. We spoke with family in the hospital and they were interested in  outpatient colonoscopy.   Patient saw PCP for hospital follow up. Family advised to discuss with Neurology the pro and cons of restarting Plavix Patient had chronic intermittent diarrhea which tends to aggravate hemorrhoids. She takes Imodium as needed. She sometimes strains, more so lately. Feels urge to have a BM but hard to expel the stool and lately passing "hard balls". This may be result of imodium. Prune juice helps  Following hospital discharge patient tried to walk without walker and fell sustaining left wrist fracture.     Past Medical History:  Diagnosis Date  . Asymptomatic gallstones   . BRBPR (bright red blood per rectum)   . Cholelithiasis   . Colitis - presumed infectious origin    one ER visit   . Dementia   . Duodenitis   . Fall   . Gastritis   . GI bleed   . Hiatal hernia   . Hyperlipidemia   . Hypertension   . Osteoarthritis   . Osteoporosis   . Stroke ([place])   . Syncope      Past Surgical History:  Procedure Laterality Date  . ABDOMINAL HYSTERECTOMY     BSO- fibroids  . ABI's     normal  . dexa  1/05   osteoporosis  . ESOPHAGOGASTRODUODENOSCOPY N/A 03/21/2014   Procedure: ESOPHAGOGASTRODUODENOSCOPY (EGD);  Surgeon: Milus Banister, MD;  Location: [place];  Service: Endoscopy;  Laterality: N/A;  . left foot brace    . WRIST FRACTURE SURGERY  2/01   R arm  .  WRIST FRACTURE SURGERY Left 02/12/2018   Family History  Problem Relation Age of Onset  . Heart attack Father   . Hypertension Father   . Heart attack Mother   . Throat cancer Brother   . Breast cancer Daughter    Social History   Tobacco Use  . Smoking status: Never Smoker  . Smokeless tobacco: Never Used  Substance Use Topics  . Alcohol use: No    Alcohol/week: 0.0 standard drinks  . Drug use: No   Current Outpatient Medications  Medication Sig Dispense Refill  . Cholecalciferol (VITAMIN D-3) 1000 units CAPS Take 1 capsule See admin instructions by mouth. Take 1000 units in  the morning and 2000 in the evening    . clopidogrel (PLAVIX) 75 MG tablet TAKE 1 TABLET BY MOUTH EVERY DAY 30 tablet 11  . donepezil (ARICEPT) 10 MG tablet TAKE 1 TABLET (10 MG TOTAL) BY MOUTH AT BEDTIME. 30 tablet 11  . Fexofenadine HCl (ALLERGY 24-HR PO) Take 1 capsule by mouth daily.    . hydrochlorothiazide (HYDRODIURIL) 25 MG tablet Take 1 tablet (25 mg total) by mouth daily. 90 tablet 3  . hydrocortisone (ANUSOL-HC) 2.5 % rectal cream Place rectally 3 (three) times daily. 30 g 0  . lisinopril (PRINIVIL,ZESTRIL) 20 MG tablet Take 1 tablet (20 mg total) by mouth daily. 90 tablet 3  . loperamide (IMODIUM A-D) 2 MG tablet Take 2 mg by mouth 4 (four) times daily as needed for diarrhea or loose stools.    . memantine (NAMENDA) 10 MG tablet TAKE 1 TABLET (10 MG TOTAL) BY MOUTH 2 (TWO) TIMES DAILY. 60 tablet 11  . metoprolol tartrate (LOPRESSOR) 25 MG tablet Take 1 tablet (25 mg total) by mouth 2 (two) times daily. 180 tablet 1  . Multiple Vitamins-Minerals ([employer] SPECTRAVITE PO) Take 1 capsule by mouth daily.    . potassium chloride (KLOR-CON M10) 10 MEQ tablet Take 2 tablets (20 mEq total) by mouth daily. (Patient taking differently: Take 20 mEq at bedtime by mouth. ) 180 tablet 3  . vitamin B-12 (CYANOCOBALAMIN) 1000 MCG tablet Take 1,000 mcg by mouth daily.     No current facility-administered medications for this visit.    Allergies  Allergen Reactions  . Alendronate Sodium Other (See Comments)    Leg pain   . Amlodipine Besylate Hives  . Calcitonin ([place]) Other (See Comments)     headache/ head pressure  . Simvastatin Other (See Comments)    Leg pain      Review of Systems: All systems reviewed and negative except where noted in HPI.   Creatinine clearance cannot be calculated (Patient's most recent lab result is older than the maximum 21 days allowed.)   Physical Exam:    Wt Readings from Last 3 Encounters:  03/11/18 167 lb (75.8 kg)  02/12/18 173 lb (78.5 kg)  08/15/17  168 lb 12.8 oz (76.6 kg)    BP 100/60   Pulse 84   Ht 5' (1.524 m)   Wt 167 lb (75.8 kg)   BMI 32.61 kg/m  Constitutional:  Pleasant female in no acute distress. Psychiatric: Normal mood and affect. Behavior is normal. EENT: Pupils normal.  Conjunctivae are normal. No scleral icterus. Neck supple.  Cardiovascular: Normal rate, regular rhythm. No edema Pulmonary/chest: Effort normal and breath sounds normal. No wheezing, rales or rhonchi. Abdominal: Soft, nondistended, nontender. Bowel sounds active throughout. There are no masses palpable. No hepatomegaly. Neurological: Alert and oriented to person place and time. Skin: Skin is  warm and dry. No rashes noted.  Tye Savoy, NP  03/11/2018, 10:36 AM

## 2018-03-12 ENCOUNTER — Telehealth: Payer: Self-pay | Admitting: Family Medicine

## 2018-03-12 DIAGNOSIS — M25552 Pain in left hip: Secondary | ICD-10-CM | POA: Diagnosis not present

## 2018-03-12 DIAGNOSIS — M810 Age-related osteoporosis without current pathological fracture: Secondary | ICD-10-CM | POA: Diagnosis not present

## 2018-03-12 DIAGNOSIS — F0390 Unspecified dementia without behavioral disturbance: Secondary | ICD-10-CM | POA: Diagnosis not present

## 2018-03-12 DIAGNOSIS — R2681 Unsteadiness on feet: Secondary | ICD-10-CM | POA: Diagnosis not present

## 2018-03-12 DIAGNOSIS — M1712 Unilateral primary osteoarthritis, left knee: Secondary | ICD-10-CM | POA: Diagnosis not present

## 2018-03-12 DIAGNOSIS — S52572D Other intraarticular fracture of lower end of left radius, subsequent encounter for closed fracture with routine healing: Secondary | ICD-10-CM | POA: Diagnosis not present

## 2018-03-12 NOTE — Telephone Encounter (Signed)
Left VM letting pt's daughter [name] know it's okay to restart med and advise her on the VM of Dr. Marliss Coots comments

## 2018-03-12 NOTE — Telephone Encounter (Signed)
I think it is ok for her to go ahead and re start plavix if she is not going to have any endoscopy and if she is no longer bleeding  I will cc this to her neurologist Dr Jaynee Eagles as well

## 2018-03-12 NOTE — Telephone Encounter (Signed)
Copied from [place] [id number]. Topic: General - Other >> Mar 12, 2018 12:07 PM Keene Breath wrote: Reason for CRM: Patient's daughter called to ask doctor if it was alright for her mother to restart her Plavix again.  Patient was told to stop Plavix because she was supposed to have a colonoscopy but that was cancelled.  Therefore, daughter is calling to verify that it is ok for her to restart the Plavix again now.  Please advise and call daughter back as soon as possible.  CB# [phone].

## 2018-03-17 DIAGNOSIS — R2681 Unsteadiness on feet: Secondary | ICD-10-CM | POA: Diagnosis not present

## 2018-03-17 DIAGNOSIS — S52572D Other intraarticular fracture of lower end of left radius, subsequent encounter for closed fracture with routine healing: Secondary | ICD-10-CM | POA: Diagnosis not present

## 2018-03-17 DIAGNOSIS — M810 Age-related osteoporosis without current pathological fracture: Secondary | ICD-10-CM | POA: Diagnosis not present

## 2018-03-17 DIAGNOSIS — M25552 Pain in left hip: Secondary | ICD-10-CM | POA: Diagnosis not present

## 2018-03-17 DIAGNOSIS — F0390 Unspecified dementia without behavioral disturbance: Secondary | ICD-10-CM | POA: Diagnosis not present

## 2018-03-17 DIAGNOSIS — M1712 Unilateral primary osteoarthritis, left knee: Secondary | ICD-10-CM | POA: Diagnosis not present

## 2018-03-19 ENCOUNTER — Telehealth (INDEPENDENT_AMBULATORY_CARE_PROVIDER_SITE_OTHER): Payer: Self-pay | Admitting: Family Medicine

## 2018-03-19 DIAGNOSIS — M25552 Pain in left hip: Secondary | ICD-10-CM | POA: Diagnosis not present

## 2018-03-19 DIAGNOSIS — M1712 Unilateral primary osteoarthritis, left knee: Secondary | ICD-10-CM | POA: Diagnosis not present

## 2018-03-19 DIAGNOSIS — R2681 Unsteadiness on feet: Secondary | ICD-10-CM | POA: Diagnosis not present

## 2018-03-19 DIAGNOSIS — F0390 Unspecified dementia without behavioral disturbance: Secondary | ICD-10-CM | POA: Diagnosis not present

## 2018-03-19 DIAGNOSIS — M810 Age-related osteoporosis without current pathological fracture: Secondary | ICD-10-CM | POA: Diagnosis not present

## 2018-03-19 DIAGNOSIS — S52572D Other intraarticular fracture of lower end of left radius, subsequent encounter for closed fracture with routine healing: Secondary | ICD-10-CM | POA: Diagnosis not present

## 2018-03-19 NOTE — Telephone Encounter (Signed)
Wendy Krueger (PT) with [facility] called needing verbal orders to extend (PT) 2 WK 2 and 1 WK 3   The number to contact Marzetta Board is [phone]

## 2018-03-20 NOTE — Telephone Encounter (Signed)
Please advise 

## 2018-03-20 NOTE — Telephone Encounter (Signed)
Ok to approve 

## 2018-03-20 NOTE — Telephone Encounter (Signed)
Left message on patient's voicemail.

## 2018-03-23 ENCOUNTER — Encounter: Payer: Self-pay | Admitting: Nurse Practitioner

## 2018-03-24 ENCOUNTER — Ambulatory Visit (INDEPENDENT_AMBULATORY_CARE_PROVIDER_SITE_OTHER): Payer: Medicare Other | Admitting: Family Medicine

## 2018-03-24 ENCOUNTER — Encounter (INDEPENDENT_AMBULATORY_CARE_PROVIDER_SITE_OTHER): Payer: Self-pay | Admitting: Family Medicine

## 2018-03-24 ENCOUNTER — Ambulatory Visit (INDEPENDENT_AMBULATORY_CARE_PROVIDER_SITE_OTHER): Payer: Medicare Other

## 2018-03-24 DIAGNOSIS — S52572D Other intraarticular fracture of lower end of left radius, subsequent encounter for closed fracture with routine healing: Secondary | ICD-10-CM | POA: Diagnosis not present

## 2018-03-24 DIAGNOSIS — S52572A Other intraarticular fracture of lower end of left radius, initial encounter for closed fracture: Secondary | ICD-10-CM | POA: Diagnosis not present

## 2018-03-24 DIAGNOSIS — M1712 Unilateral primary osteoarthritis, left knee: Secondary | ICD-10-CM | POA: Diagnosis not present

## 2018-03-24 DIAGNOSIS — F0390 Unspecified dementia without behavioral disturbance: Secondary | ICD-10-CM | POA: Diagnosis not present

## 2018-03-24 DIAGNOSIS — R2681 Unsteadiness on feet: Secondary | ICD-10-CM | POA: Diagnosis not present

## 2018-03-24 DIAGNOSIS — M810 Age-related osteoporosis without current pathological fracture: Secondary | ICD-10-CM | POA: Diagnosis not present

## 2018-03-24 DIAGNOSIS — M25552 Pain in left hip: Secondary | ICD-10-CM | POA: Diagnosis not present

## 2018-03-24 NOTE — Progress Notes (Signed)
I agree with the above note, plan 

## 2018-03-24 NOTE — Progress Notes (Signed)
Office Visit Note   Patient: Wendy Krueger           Date of Birth: 1930-03-17           MRN: [id number] Visit Date: 03/24/2018 Requested by: Tower, Wynelle Fanny, MD [place], [place] [address] PCP: Abner Greenspan, MD  Subjective: Chief Complaint  Patient presents with  . Left Wrist - Follow-up    HPI: She is about 6 weeks status post fall resulting in left wrist fracture.  No complaints in her cast.  Getting home therapy for her gait instability.  Other injuries seem to have resolved.              ROS: Noncontributory  Objective: Vital Signs: There were no vitals taken for this visit.  Physical Exam:  Left wrist: No further tenderness to palpation at the distal radius fracture site.  She has surprisingly good range of motion but still some expected stiffness in her wrist.  Imaging: 2 view left wrist x-rays: Fracture alignment is unchanged.  She has very good callus formation.  Assessment & Plan: 1.  Clinically healed 6 weeks status post left distal radius intra-articular fracture, angulated but acceptably aligned. -Removable splint for the next 2 weeks during activity.  Take it off to work on range of motion and grip strength.  Working with home therapist regarding range of motion and strength, follow-up as needed.   Follow-Up Instructions: Return if symptoms worsen or fail to improve.       Procedures: None today.   PMFS History: Patient Active Problem List   Diagnosis Date Noted  . Chronic diarrhea 02/17/2018  . Distal radius fracture, left 02/17/2018  . Rectal bleeding 02/09/2018  . Hypokalemia 02/09/2018  . Hypotension 02/09/2018  . Lower GI bleed 02/09/2018  . Acute kidney injury ([place]) 02/09/2018  . Acute blood loss anemia 02/09/2018  . Fall 05/07/2017  . Cerebellar stroke ([place]) 03/10/2016  . Carotid aneurysm, right ([place]) 02/21/2016  . Lipoma of shoulder 03/27/2015  . History of closed Colles' fracture 09/06/2014  . Allergic rhinitis  06/20/2014  . Diastolic dysfunction 35/36/1443  . History of GI bleed 03/21/2014  . Dementia 09/01/2013  . Memory loss 09/01/2013  . Anxiety 09/01/2013  . Encounter for Medicare annual wellness exam 01/08/2013  . Gallstones 11/11/2011  . 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 Risk for falls 06/04/2011  . Constipation 03/05/2011  . Vitamin D deficiency 03/28/2009  . HYPERCHOLESTEROLEMIA, PURE 03/17/2007  . Essential hypertension 03/16/2007  . OSTEOARTHRITIS 03/16/2007  . Osteoporosis 03/16/2007   Past Medical History:  Diagnosis Date  . Asymptomatic gallstones   . BRBPR (bright red blood per rectum)   . Cholelithiasis   . Colitis - presumed infectious origin    one ER visit   . Dementia   . Duodenitis   . Fall   . Gastritis   . GI bleed   . Hiatal hernia   . Hyperlipidemia   . Hypertension   . Osteoarthritis   . Osteoporosis   . Stroke ([place])   . Syncope     Family History  Problem Relation Age of Onset  . Heart attack Father   . Hypertension Father   . Heart attack Mother   . Throat cancer Brother   . Breast cancer Daughter     Past Surgical History:  Procedure Laterality Date  . ABDOMINAL HYSTERECTOMY     BSO- fibroids  . ABI's     normal  .  dexa  1/05   osteoporosis  . ESOPHAGOGASTRODUODENOSCOPY N/A 03/21/2014   Procedure: ESOPHAGOGASTRODUODENOSCOPY (EGD);  Surgeon: Milus Banister, MD;  Location: [place];  Service: Endoscopy;  Laterality: N/A;  . left foot brace    . WRIST FRACTURE SURGERY  2/01   R arm  . WRIST FRACTURE SURGERY Left 02/12/2018   Social History   Occupational History  . Occupation: retired    Fish farm manager: RETIRED  Tobacco Use  . Smoking status: Never Smoker  . Smokeless tobacco: Never Used  Substance and Sexual Activity  . Alcohol use: No    Alcohol/week: 0.0 standard drinks  . Drug use: No  . Sexual activity: Never

## 2018-03-26 DIAGNOSIS — M1712 Unilateral primary osteoarthritis, left knee: Secondary | ICD-10-CM | POA: Diagnosis not present

## 2018-03-26 DIAGNOSIS — M810 Age-related osteoporosis without current pathological fracture: Secondary | ICD-10-CM | POA: Diagnosis not present

## 2018-03-26 DIAGNOSIS — M25552 Pain in left hip: Secondary | ICD-10-CM | POA: Diagnosis not present

## 2018-03-26 DIAGNOSIS — R2681 Unsteadiness on feet: Secondary | ICD-10-CM | POA: Diagnosis not present

## 2018-03-26 DIAGNOSIS — F0390 Unspecified dementia without behavioral disturbance: Secondary | ICD-10-CM | POA: Diagnosis not present

## 2018-03-26 DIAGNOSIS — S52572D Other intraarticular fracture of lower end of left radius, subsequent encounter for closed fracture with routine healing: Secondary | ICD-10-CM | POA: Diagnosis not present

## 2018-03-31 DIAGNOSIS — M810 Age-related osteoporosis without current pathological fracture: Secondary | ICD-10-CM | POA: Diagnosis not present

## 2018-03-31 DIAGNOSIS — M1712 Unilateral primary osteoarthritis, left knee: Secondary | ICD-10-CM | POA: Diagnosis not present

## 2018-03-31 DIAGNOSIS — R2681 Unsteadiness on feet: Secondary | ICD-10-CM | POA: Diagnosis not present

## 2018-03-31 DIAGNOSIS — M25552 Pain in left hip: Secondary | ICD-10-CM | POA: Diagnosis not present

## 2018-03-31 DIAGNOSIS — F0390 Unspecified dementia without behavioral disturbance: Secondary | ICD-10-CM | POA: Diagnosis not present

## 2018-03-31 DIAGNOSIS — S52572D Other intraarticular fracture of lower end of left radius, subsequent encounter for closed fracture with routine healing: Secondary | ICD-10-CM | POA: Diagnosis not present

## 2018-04-02 DIAGNOSIS — R2681 Unsteadiness on feet: Secondary | ICD-10-CM | POA: Diagnosis not present

## 2018-04-02 DIAGNOSIS — S52572D Other intraarticular fracture of lower end of left radius, subsequent encounter for closed fracture with routine healing: Secondary | ICD-10-CM | POA: Diagnosis not present

## 2018-04-02 DIAGNOSIS — M1712 Unilateral primary osteoarthritis, left knee: Secondary | ICD-10-CM | POA: Diagnosis not present

## 2018-04-02 DIAGNOSIS — M810 Age-related osteoporosis without current pathological fracture: Secondary | ICD-10-CM | POA: Diagnosis not present

## 2018-04-02 DIAGNOSIS — F0390 Unspecified dementia without behavioral disturbance: Secondary | ICD-10-CM | POA: Diagnosis not present

## 2018-04-02 DIAGNOSIS — M25552 Pain in left hip: Secondary | ICD-10-CM | POA: Diagnosis not present

## 2018-04-07 DIAGNOSIS — R2681 Unsteadiness on feet: Secondary | ICD-10-CM | POA: Diagnosis not present

## 2018-04-07 DIAGNOSIS — F0390 Unspecified dementia without behavioral disturbance: Secondary | ICD-10-CM | POA: Diagnosis not present

## 2018-04-07 DIAGNOSIS — S52572D Other intraarticular fracture of lower end of left radius, subsequent encounter for closed fracture with routine healing: Secondary | ICD-10-CM | POA: Diagnosis not present

## 2018-04-07 DIAGNOSIS — M1712 Unilateral primary osteoarthritis, left knee: Secondary | ICD-10-CM | POA: Diagnosis not present

## 2018-04-07 DIAGNOSIS — M25552 Pain in left hip: Secondary | ICD-10-CM | POA: Diagnosis not present

## 2018-04-07 DIAGNOSIS — M810 Age-related osteoporosis without current pathological fracture: Secondary | ICD-10-CM | POA: Diagnosis not present

## 2018-04-10 ENCOUNTER — Ambulatory Visit: Payer: Medicare Other

## 2018-04-10 ENCOUNTER — Other Ambulatory Visit: Payer: Self-pay | Admitting: *Deleted

## 2018-04-10 MED ORDER — POTASSIUM CHLORIDE CRYS ER 10 MEQ PO TBCR: 20.0000 meq | EXTENDED_RELEASE_TABLET | Freq: Every day | ORAL | 1 refills | Status: DC

## 2018-04-13 ENCOUNTER — Ambulatory Visit (INDEPENDENT_AMBULATORY_CARE_PROVIDER_SITE_OTHER): Payer: Medicare Other

## 2018-04-13 VITALS — BP 132/88 | HR 53 | Temp 97.7°F | Ht 60.0 in | Wt 163.5 lb

## 2018-04-13 DIAGNOSIS — H269 Unspecified cataract: Secondary | ICD-10-CM

## 2018-04-13 DIAGNOSIS — I10 Essential (primary) hypertension: Secondary | ICD-10-CM | POA: Diagnosis not present

## 2018-04-13 DIAGNOSIS — E559 Vitamin D deficiency, unspecified: Secondary | ICD-10-CM

## 2018-04-13 DIAGNOSIS — E7800 Pure hypercholesterolemia, unspecified: Secondary | ICD-10-CM | POA: Diagnosis not present

## 2018-04-13 DIAGNOSIS — Z0000 Encounter for general adult medical examination without abnormal findings: Secondary | ICD-10-CM

## 2018-04-13 HISTORY — DX: Unspecified cataract: H26.9

## 2018-04-13 LAB — LIPID PANEL
CHOLESTEROL: 287 mg/dL — AB (ref 0–200)
HDL: 59.5 mg/dL (ref >39.00)
LDL CALC: 190 mg/dL — AB (ref 0–99)
NonHDL: 227.59
TRIGLYCERIDES: 187 mg/dL — AB (ref 0.0–149.0)
Total CHOL/HDL Ratio: 5
VLDL: 37.4 mg/dL (ref 0.0–40.0)

## 2018-04-13 LAB — TSH: TSH: 2.05 u[IU]/mL (ref 0.35–4.50)

## 2018-04-13 LAB — VITAMIN D 25 HYDROXY (VIT D DEFICIENCY, FRACTURES): VITD: 61.25 ng/mL (ref 30.00–100.00)

## 2018-04-13 LAB — LDL CHOLESTEROL, DIRECT: Direct LDL: 215 mg/dL

## 2018-04-13 NOTE — Progress Notes (Signed)
.  PCP notes:   Health maintenance:  Flu vaccine - family will discuss with PCP at annual exam appt  Abnormal screenings:   Fall risk - hx of multiple falls Fall Risk  04/13/2018 04/09/2017 02/21/2017 01/03/2016 01/03/2016  Falls in the past year? Yes Yes No No No  Comment 2 falls due to loss of balance; 2nd fall resulted in wrist fracture pt fell backwards after trying to sit down in a chair - - -  Number falls in past yr: 2 or more 1 - - -  Injury with Fall? Yes No - - -  Risk Factor Category  High Fall Risk - - - -  Risk for fall due to : History of fall(s);Impaired balance/gait;Impaired mobility;Mental status change - History of fall(s) - -   Hearing - failed  Hearing Screening   125Hz  250Hz  500Hz  1000Hz  2000Hz  3000Hz  4000Hz  6000Hz  8000Hz   Right ear:   40 40 40  0    Left ear:   0 0 0  0     Patient concerns:   None  Nurse concerns:  None  Next PCP appt:   101/8/19 @ 1200  I reviewed health advisor's note, was available for consultation, and agree with documentation and plan. Loura Pardon MD

## 2018-04-13 NOTE — Progress Notes (Signed)
Subjective:   Wendy Krueger is a 84 y.o. female who presents for Medicare Annual (Subsequent) preventive examination.  Review of Systems:  N/A Cardiac Risk Factors include: advanced age (>51men, >64 women);obesity (BMI >30kg/m2);hypertension     Objective:     Vitals: BP 132/88 (BP Location: Left Arm, Patient Position: Sitting, Cuff Size: Normal)   Pulse (!) 53   Temp 97.7 F (36.5 C) (Oral)   Ht 5' (1.524 m) Comment: shoes  Wt 163 lb 8 oz (74.2 kg)   SpO2 97%   BMI 31.93 kg/m   Body mass index is 31.93 kg/m.  Advanced Directives 04/13/2018 02/09/2018 05/07/2017 05/06/2017 04/09/2017 01/03/2016 01/03/2016  Does Patient Have a Medical Advance Directive? Yes Yes - No Yes Yes Yes  Type of Paramedic of [place];Living will [place];Living will [place];Living will [place];Living will  Does patient want to make changes to medical advance directive? - No - Patient declined - - - No - Patient declined -  Copy of [place] in Chart? Yes No - copy requested - - No - copy requested No - copy requested No - copy requested  Would patient like information on creating a medical advance directive? - - No - Patient declined - - - -    Tobacco Social History   Tobacco Use  Smoking Status Never Smoker  Smokeless Tobacco Never Used     Counseling given: No   Clinical Intake:  Pre-visit preparation completed: Yes  Pain : No/denies pain Pain Score: 0-No pain     Nutritional Status: BMI > 30  Obese Nutritional Risks: None Diabetes: No  How often do you need to have someone help you when you read instructions, pamphlets, or other written materials from your doctor or pharmacy?: 5 - Always What is the last grade level you completed in school?: 11th grade  Interpreter Needed?: No  Comments: pt is widow and lives with daughter [name] Information  entered by :: [employer], LPN  Past Medical History:  Diagnosis Date  . Asymptomatic gallstones   . BRBPR (bright red blood per rectum)   . Cataract 04/13/2018   bilateral eyes  . Cholelithiasis   . Colitis - presumed infectious origin    one ER visit   . Dementia ([place])   . Duodenitis   . Fall   . Gastritis   . GI bleed   . Hiatal hernia   . Hyperlipidemia   . Hypertension   . Osteoarthritis   . Osteoporosis   . Stroke ([place])   . Syncope    Past Surgical History:  Procedure Laterality Date  . ABDOMINAL HYSTERECTOMY     BSO- fibroids  . ABI's     normal  . dexa  1/05   osteoporosis  . ESOPHAGOGASTRODUODENOSCOPY N/A 03/21/2014   Procedure: ESOPHAGOGASTRODUODENOSCOPY (EGD);  Surgeon: Milus Banister, MD;  Location: [facility];  Service: Endoscopy;  Laterality: N/A;  . left foot brace    . WRIST FRACTURE SURGERY  2/01   R arm  . WRIST FRACTURE SURGERY Left 02/12/2018   Family History  Problem Relation Age of Onset  . Heart attack Father   . Hypertension Father   . Heart attack Mother   . Throat cancer Brother   . Breast cancer Daughter    Social History   Socioeconomic History  . Marital status: Widowed    Spouse name: Not on  file  . Number of children: 5  . Years of education: 23  . Highest education level: Not on file  Occupational History  . Occupation: retired    Fish farm manager: RETIRED  Social Needs  . Financial resource strain: Not on file  . Food insecurity:    Worry: Not on file    Inability: Not on file  . Transportation needs:    Medical: Not on file    Non-medical: Not on file  Tobacco Use  . Smoking status: Never Smoker  . Smokeless tobacco: Never Used  Substance and Sexual Activity  . Alcohol use: No    Alcohol/week: 0.0 standard drinks  . Drug use: No  . Sexual activity: Never  Lifestyle  . Physical activity:    Days per week: Not on file    Minutes per session: Not on file  . Stress: Not on file  Relationships  . Social connections:     Talks on phone: Not on file    Gets together: Not on file    Attends religious service: Not on file    Active member of club or organization: Not on file    Attends meetings of clubs or organizations: Not on file    Relationship status: Not on file  Other Topics Concern  . Not on file  Social History Narrative   Has 4 brothers, 1 sister   Lives with 2 adult grandchildren and her daughter   Has 4 daughters, 1 son   Right handed   Rare caffeine     Outpatient Encounter Medications as of 04/13/2018  Medication Sig  . Cholecalciferol (VITAMIN D-3) 1000 units CAPS Take 1 capsule See admin instructions by mouth. Take 1000 units in the morning and 2000 in the evening  . clopidogrel (PLAVIX) 75 MG tablet TAKE 1 TABLET BY MOUTH EVERY DAY  . donepezil (ARICEPT) 10 MG tablet TAKE 1 TABLET (10 MG TOTAL) BY MOUTH AT BEDTIME.  Marland Kitchen Fexofenadine HCl (ALLERGY 24-HR PO) Take 1 capsule by mouth daily.  . hydrochlorothiazide (HYDRODIURIL) 25 MG tablet Take 1 tablet (25 mg total) by mouth daily.  . hydrocortisone (ANUSOL-HC) 2.5 % rectal cream Place rectally 3 (three) times daily.  Marland Kitchen lisinopril (PRINIVIL,ZESTRIL) 20 MG tablet Take 1 tablet (20 mg total) by mouth daily.  . memantine (NAMENDA) 10 MG tablet TAKE 1 TABLET (10 MG TOTAL) BY MOUTH 2 (TWO) TIMES DAILY.  . metoprolol tartrate (LOPRESSOR) 25 MG tablet Take 1 tablet (25 mg total) by mouth 2 (two) times daily.  . Multiple Vitamins-Minerals ([employer] SPECTRAVITE PO) Take 1 capsule by mouth daily.  . potassium chloride (KLOR-CON M10) 10 MEQ tablet Take 2 tablets (20 mEq total) by mouth daily.  . vitamin B-12 (CYANOCOBALAMIN) 1000 MCG tablet Take 1,000 mcg by mouth daily.  . [DISCONTINUED] loperamide (IMODIUM A-D) 2 MG tablet Take 2 mg by mouth 4 (four) times daily as needed for diarrhea or loose stools.   No facility-administered encounter medications on file as of 04/13/2018.     Activities of Daily Living In your present state of health, do you have  any difficulty performing the following activities: 04/13/2018 02/09/2018  Hearing? N N  Vision? Y Y  Difficulty concentrating or making decisions? Tempie Donning  Walking or climbing stairs? Y Y  Dressing or bathing? Y Y  Doing errands, shopping? Y N  Preparing Food and eating ? Y -  Using the Toilet? Y -  In the past six months, have you accidently leaked urine? Y -  Do you have problems with loss of bowel control? Y -  Managing your Medications? Y -  Managing your Finances? Y -  Housekeeping or managing your Housekeeping? Y -  Some recent data might be hidden    Patient Care Team: Tower, Wynelle Fanny, MD as PCP - General Shirl Harris, OD as Referring Physician (Optometry)    Assessment:   This is a routine wellness examination for Sheretta.   Hearing Screening   125Hz  250Hz  500Hz  1000Hz  2000Hz  3000Hz  4000Hz  6000Hz  8000Hz   Right ear:   40 40 40  0    Left ear:   0 0 0  0    Vision Screening Comments: Unable to complete due to cognitive impairment    Exercise Activities and Dietary recommendations Current Exercise Habits: Home exercise routine, Type of exercise: Other - see comments(physical therapy), Time (Minutes): 20, Frequency (Times/Week): 5, Weekly Exercise (Minutes/Week): 100, Intensity: Mild, Exercise limited by: None identified  Goals    . Reduce sodium intake     Starting 04/13/2018, I will continue to monitor intake of sodium in diet. Daily intake should be less than 1500 mg.        Fall Risk Fall Risk  04/13/2018 04/09/2017 02/21/2017 01/03/2016 01/03/2016  Falls in the past year? Yes Yes No No No  Comment - pt fell backwards after trying to sit down in a chair - - -  Number falls in past yr: 2 or more 1 - - -  Injury with Fall? Yes No - - -  Risk Factor Category  High Fall Risk - - - -  Risk for fall due to : History of fall(s);Impaired balance/gait;Impaired mobility;Mental status change - History of fall(s) - -   Depression Screen PHQ 2/9 Scores 04/13/2018 04/09/2017  02/21/2017 01/03/2016  PHQ - 2 Score 0 0 2 0  PHQ- 9 Score 0 0 10 -     Cognitive Function - Note: dx of dementia. Mini-Cog was not completed.  MMSE - Mini Mental State Exam 04/13/2018 04/09/2017 12/05/2016 12/05/2016 06/06/2016  Not completed: Unable to complete (No Data) - - -  Orientation to time - - - 1 1  Orientation to Place - - - 1 2  Registration - - - 3 3  Attention/ Calculation - - - 0 0  Recall - - - 0 0  Language- name 2 objects - - - 2 2  Language- repeat - - - 0 0  Language- follow 3 step command - - - 2 3  Language- read & follow direction - - - 1 1  Write a sentence - - 1 1 1   Copy design - - 1 1 0  Total score - - - 12 13        Immunization History  Administered Date(s) Administered  . Influenza Split 06/04/2011, 07/10/2012  . Influenza,inj,Quad PF,6+ Mos 07/12/2014, 08/09/2016, 04/09/2017  . Pneumococcal Conjugate-13 01/11/2014  . Pneumococcal Polysaccharide-23 06/04/2011  . Td 08/27/2004   Screening Tests Health Maintenance  Topic Date Due  . INFLUENZA VACCINE  09/30/2018 (Originally 01/29/2018)  . TETANUS/TDAP  08/26/2024 (Originally 08/27/2014)  . MAMMOGRAM  11/18/2018  . DEXA SCAN  Completed  . PNA vac Low Risk Adult  Completed      Plan:     I have personally reviewed, addressed, and noted the following in the patient's chart:  A. Medical and social history B. Use of alcohol, tobacco or illicit drugs  C. Current medications and supplements D. Functional ability and  status E.  Nutritional status F.  Physical activity G. Advance directives H. List of other physicians I.  Hospitalizations, surgeries, and ER visits in previous 12 months J.  [place] to include hearing, vision, cognitive, depression L. Referrals and appointments - none  In addition, I have reviewed and discussed with patient certain preventive protocols, quality metrics, and best practice recommendations. A written personalized care plan for preventive services as well as  general preventive health recommendations were provided to patient.  See attached scanned questionnaire for additional information.   Signed,   Lindell Noe, MHA, BS, LPN Health Coach

## 2018-04-13 NOTE — Patient Instructions (Addendum)
Wendy Krueger , Thank you for taking time to come for your Medicare Wellness Visit. I appreciate your ongoing commitment to your health goals. Please review the following plan we discussed and let me know if I can assist you in the future.   These are the goals we discussed: Goals    . Reduce sodium intake     Starting 04/13/2018, I will continue to monitor intake of sodium in diet. Daily intake should be less than 1500 mg.        This is a list of the screening recommended for you and due dates:  Health Maintenance  Topic Date Due  . Flu Shot  09/30/2018*  . Tetanus Vaccine  08/26/2024*  . Mammogram  11/18/2018  . DEXA scan (bone density measurement)  Completed  . Pneumonia vaccines  Completed  *Topic was postponed. The date shown is not the original due date.   Preventive Care for Adults  A healthy lifestyle and preventive care can promote health and wellness. Preventive health guidelines for adults include the following key practices.  . A routine yearly physical is a good way to check with your health care provider about your health and preventive screening. It is a chance to share any concerns and updates on your health and to receive a thorough exam.  . Visit your dentist for a routine exam and preventive care every 6 months. Brush your teeth twice a day and floss once a day. Good oral hygiene prevents tooth decay and gum disease.  . The frequency of eye exams is based on your age, health, family medical history, use  of contact lenses, and other factors. Follow your health care provider's recommendations for frequency of eye exams.  . Eat a healthy diet. Foods like vegetables, fruits, whole grains, low-fat dairy products, and lean protein foods contain the nutrients you need without too many calories. Decrease your intake of foods high in solid fats, added sugars, and salt. Eat the right amount of calories for you. Get information about a proper diet from your health care  provider, if necessary.  . Regular physical exercise is one of the most important things you can do for your health. Most adults should get at least 150 minutes of moderate-intensity exercise (any activity that increases your heart rate and causes you to sweat) each week. In addition, most adults need muscle-strengthening exercises on 2 or more days a week.  Silver Sneakers may be a benefit available to you. To determine eligibility, you may visit the website: www.silversneakers.com or contact program at (279) 621-7927 Mon-Fri between 8AM-8PM.   . Maintain a healthy weight. The body mass index (BMI) is a screening tool to identify possible weight problems. It provides an estimate of body fat based on height and weight. Your health care provider can find your BMI and can help you achieve or maintain a healthy weight.   For adults 20 years and older: ? A BMI below 18.5 is considered underweight. ? A BMI of 18.5 to 24.9 is normal. ? A BMI of 25 to 29.9 is considered overweight. ? A BMI of 30 and above is considered obese.   . Maintain normal blood lipids and cholesterol levels by exercising and minimizing your intake of saturated fat. Eat a balanced diet with plenty of fruit and vegetables. Blood tests for lipids and cholesterol should begin at age 67 and be repeated every 5 years. If your lipid or cholesterol levels are high, you are over 50, or you are at  high risk for heart disease, you may need your cholesterol levels checked more frequently. Ongoing high lipid and cholesterol levels should be treated with medicines if diet and exercise are not working.  . If you smoke, find out from your health care provider how to quit. If you do not use tobacco, please do not start.  . If you choose to drink alcohol, please do not consume more than 2 drinks per day. One drink is considered to be 12 ounces (355 mL) of beer, 5 ounces (148 mL) of wine, or 1.5 ounces (44 mL) of liquor.  . If you are 43-79 years  old, ask your health care provider if you should take aspirin to prevent strokes.  . Use sunscreen. Apply sunscreen liberally and repeatedly throughout the day. You should seek shade when your shadow is shorter than you. Protect yourself by wearing long sleeves, pants, a wide-brimmed hat, and sunglasses year round, whenever you are outdoors.  . Once a month, do a whole body skin exam, using a mirror to look at the skin on your back. Tell your health care provider of new moles, moles that have irregular borders, moles that are larger than a pencil eraser, or moles that have changed in shape or color.

## 2018-04-16 DIAGNOSIS — S52572D Other intraarticular fracture of lower end of left radius, subsequent encounter for closed fracture with routine healing: Secondary | ICD-10-CM | POA: Diagnosis not present

## 2018-04-16 DIAGNOSIS — M810 Age-related osteoporosis without current pathological fracture: Secondary | ICD-10-CM | POA: Diagnosis not present

## 2018-04-16 DIAGNOSIS — M25552 Pain in left hip: Secondary | ICD-10-CM | POA: Diagnosis not present

## 2018-04-16 DIAGNOSIS — R2681 Unsteadiness on feet: Secondary | ICD-10-CM | POA: Diagnosis not present

## 2018-04-16 DIAGNOSIS — M1712 Unilateral primary osteoarthritis, left knee: Secondary | ICD-10-CM | POA: Diagnosis not present

## 2018-04-16 DIAGNOSIS — F0390 Unspecified dementia without behavioral disturbance: Secondary | ICD-10-CM | POA: Diagnosis not present

## 2018-04-17 ENCOUNTER — Encounter: Payer: Self-pay | Admitting: Family Medicine

## 2018-04-17 ENCOUNTER — Ambulatory Visit (INDEPENDENT_AMBULATORY_CARE_PROVIDER_SITE_OTHER): Payer: Medicare Other | Admitting: Family Medicine

## 2018-04-17 VITALS — BP 140/78 | HR 58 | Temp 97.5°F | Ht 60.0 in

## 2018-04-17 DIAGNOSIS — E7800 Pure hypercholesterolemia, unspecified: Secondary | ICD-10-CM

## 2018-04-17 DIAGNOSIS — I10 Essential (primary) hypertension: Secondary | ICD-10-CM

## 2018-04-17 DIAGNOSIS — M810 Age-related osteoporosis without current pathological fracture: Secondary | ICD-10-CM

## 2018-04-17 DIAGNOSIS — K529 Noninfective gastroenteritis and colitis, unspecified: Secondary | ICD-10-CM

## 2018-04-17 DIAGNOSIS — Z23 Encounter for immunization: Secondary | ICD-10-CM | POA: Diagnosis not present

## 2018-04-17 DIAGNOSIS — I639 Cerebral infarction, unspecified: Secondary | ICD-10-CM | POA: Diagnosis not present

## 2018-04-17 DIAGNOSIS — I720 Aneurysm of carotid artery: Secondary | ICD-10-CM | POA: Diagnosis not present

## 2018-04-17 DIAGNOSIS — F0150 Vascular dementia without behavioral disturbance: Secondary | ICD-10-CM

## 2018-04-17 DIAGNOSIS — E559 Vitamin D deficiency, unspecified: Secondary | ICD-10-CM

## 2018-04-17 DIAGNOSIS — I6381 Other cerebral infarction due to occlusion or stenosis of small artery: Secondary | ICD-10-CM | POA: Diagnosis not present

## 2018-04-17 MED ORDER — POTASSIUM CHLORIDE CRYS ER 10 MEQ PO TBCR: 20.0000 meq | EXTENDED_RELEASE_TABLET | Freq: Every day | ORAL | 3 refills | Status: DC

## 2018-04-17 MED ORDER — METOPROLOL TARTRATE 25 MG PO TABS
25.0000 mg | ORAL_TABLET | Freq: Two times a day (BID) | ORAL | 3 refills | Status: DC
Start: 2018-04-17 — End: 2019-04-19

## 2018-04-17 MED ORDER — LISINOPRIL 20 MG PO TABS: 20.0000 mg | ORAL_TABLET | Freq: Every day | ORAL | 3 refills | Status: DC

## 2018-04-17 MED ORDER — HYDROCHLOROTHIAZIDE 25 MG PO TABS: 25.0000 mg | ORAL_TABLET | Freq: Every day | ORAL | 3 refills | Status: DC

## 2018-04-17 NOTE — Assessment & Plan Note (Signed)
bp in fair control at this time  BP Readings from Last 1 Encounters:  04/17/18 140/78   No changes needed Most recent labs reviewed  Disc lifstyle change with low sodium diet and exercise  No hypotension

## 2018-04-17 NOTE — Assessment & Plan Note (Signed)
On and off with IBS Watching for diet triggers Much improved Not thought to be due to aricept at this time

## 2018-04-17 NOTE — Assessment & Plan Note (Signed)
Disc goals for lipids and reasons to control them Rev last labs with pt Rev low sat fat diet in detail Declines medication despite cva hx  Due to age and past intol of zocor

## 2018-04-17 NOTE — Progress Notes (Signed)
Subjective:    Patient ID: Wendy Krueger, female    DOB: 19-Jan-1930, 84 y.o.   MRN: [id number]  HPI Here for annual f/u of chronic health problems   Things have been going pretty good  Wrist is better  No more GI bleeding  Doing better with walking / PT is working with her (with walker)  Doing everything they can to prevent falls  Has a new caregiver now- making sure she always has walker (more cooperative now)   Wt Readings from Last 3 Encounters:  04/13/18 163 lb 8 oz (74.2 kg)  03/11/18 167 lb (75.8 kg)  02/12/18 173 lb (78.5 kg)  in wheelchair today so did not re check wt  Still has a pretty good appetite  Was down at amw Given flu shot today  31.93 kg/m   Had amw on 10/14  Noted 2 falls from bad balance- one with a wrist fx Hearing- abn screen in L ear (does not want a hearing aide) - does not think it is a safety issue    Mammogram 5/19 neg Self breast exam - no lumps  Unsure at her age if she will continue having mammograms    dexa 11/10 OP D level of 61- improved Hx of colles' fracture  Intolerant of several medications incl alendronate and calcitonin (leg pain/headache)   bp is stable today  No cp or palpitations or headaches or edema  No side effects to medicines  BP Readings from Last 3 Encounters:  04/17/18 140/78  04/13/18 132/88  03/11/18 100/60     On plavix for hx of CVA Was held for GI bleed in the past year   Her bowel habits have improved  No more severe diarrhea   Vascular dementia aricept and namenda   Sees Dr Jaynee Eagles in neurology   Zoster status - not interested in shingrix   Hyperlipidemia Lab Results  Component Value Date   CHOL 287 (H) 04/13/2018   CHOL 344 (H) 04/09/2017   CHOL 315 (H) 11/22/2016   Lab Results  Component Value Date   HDL 59.50 04/13/2018   HDL 51.80 04/09/2017   HDL 52.80 11/22/2016   Lab Results  Component Value Date   LDLCALC 190 (H) 04/13/2018   LDLCALC 199 (H) 01/03/2014   LDLCALC 66  01/06/2011   Lab Results  Component Value Date   TRIG 187.0 (H) 04/13/2018   TRIG 375.0 (H) 04/09/2017   TRIG 284.0 (H) 11/22/2016   Lab Results  Component Value Date   CHOLHDL 5 04/13/2018   CHOLHDL 7 04/09/2017   CHOLHDL 6 11/22/2016   Lab Results  Component Value Date   LDLDIRECT 215.0 04/13/2018   LDLDIRECT 210.0 04/09/2017   LDLDIRECT 182.0 11/22/2016   Intolerant of statins in the past  Does not desire chol tx at her age  Does not pay much attn to diet- but avoids a few things like bacon   Lab Results  Component Value Date   CREATININE 1.18 02/17/2018   BUN 18 02/17/2018   NA 137 02/17/2018   K 3.9 02/17/2018   CL 102 02/17/2018   CO2 28 02/17/2018   Lab Results  Component Value Date   WBC 4.9 02/17/2018   HGB 11.2 (L) 02/17/2018   HCT 33.9 (L) 02/17/2018   MCV 93.5 02/17/2018   PLT 267.0 02/17/2018   normalized after her hospitalization   Lab Results  Component Value Date   TSH 2.05 04/13/2018     Patient Active Problem  List   Diagnosis Date Noted  . Chronic diarrhea 02/17/2018  . Distal radius fracture, left 02/17/2018  . Hypokalemia 02/09/2018  . History of lower GI bleeding 02/09/2018  . Cerebellar stroke ([place]) 03/10/2016  . Carotid aneurysm, right ([place]) 02/21/2016  . Lipoma of shoulder 03/27/2015  . History of closed Colles' fracture 09/06/2014  . Allergic rhinitis 06/20/2014  . Diastolic dysfunction 56/21/3086  . History of GI bleed 03/21/2014  . Dementia ([place]) 09/01/2013  . Memory loss 09/01/2013  . Anxiety 09/01/2013  . Encounter for Medicare annual wellness exam 01/08/2013  . Gallstones 11/11/2011  . 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 Risk for falls 06/04/2011  . Constipation 03/05/2011  . Vitamin D deficiency 03/28/2009  . HYPERCHOLESTEROLEMIA, PURE 03/17/2007  . Essential hypertension 03/16/2007  . OSTEOARTHRITIS 03/16/2007  . Osteoporosis 03/16/2007   Past Medical History:  Diagnosis Date  . Asymptomatic  gallstones   . BRBPR (bright red blood per rectum)   . Cataract 04/13/2018   bilateral eyes  . Cholelithiasis   . Colitis - presumed infectious origin    one ER visit   . Dementia ([place])   . Duodenitis   . Fall   . Gastritis   . GI bleed   . Hiatal hernia   . Hyperlipidemia   . Hypertension   . Osteoarthritis   . Osteoporosis   . Stroke ([place])   . Syncope    Past Surgical History:  Procedure Laterality Date  . ABDOMINAL HYSTERECTOMY     BSO- fibroids  . ABI's     normal  . dexa  1/05   osteoporosis  . ESOPHAGOGASTRODUODENOSCOPY N/A 03/21/2014   Procedure: ESOPHAGOGASTRODUODENOSCOPY (EGD);  Surgeon: Milus Banister, MD;  Location: [facility];  Service: Endoscopy;  Laterality: N/A;  . left foot brace    . WRIST FRACTURE SURGERY  2/01   R arm  . WRIST FRACTURE SURGERY Left 02/12/2018   Social History   Tobacco Use  . Smoking status: Never Smoker  . Smokeless tobacco: Never Used  Substance Use Topics  . Alcohol use: No    Alcohol/week: 0.0 standard drinks  . Drug use: No   Family History  Problem Relation Age of Onset  . Heart attack Father   . Hypertension Father   . Heart attack Mother   . Throat cancer Brother   . Breast cancer Daughter    Allergies  Allergen Reactions  . Alendronate Sodium Other (See Comments)    Leg pain   . Amlodipine Besylate Hives  . Calcitonin ([place]) Other (See Comments)     headache/ head pressure  . Simvastatin Other (See Comments)    Leg pain    Current Outpatient Medications on File Prior to Visit  Medication Sig Dispense Refill  . Cholecalciferol (VITAMIN D-3) 1000 units CAPS Take 1 capsule See admin instructions by mouth. Take 1000 units in the morning and 2000 in the evening    . clopidogrel (PLAVIX) 75 MG tablet TAKE 1 TABLET BY MOUTH EVERY DAY 30 tablet 11  . donepezil (ARICEPT) 10 MG tablet TAKE 1 TABLET (10 MG TOTAL) BY MOUTH AT BEDTIME. 30 tablet 11  . Fexofenadine HCl (ALLERGY 24-HR PO) Take 1 capsule by mouth  daily.    . hydrocortisone (ANUSOL-HC) 2.5 % rectal cream Place rectally 3 (three) times daily. 30 g 0  . memantine (NAMENDA) 10 MG tablet TAKE 1 TABLET (10 MG TOTAL) BY MOUTH 2 (TWO) TIMES DAILY. 60 tablet 11  . Multiple Vitamins-Minerals ([facility]  SPECTRAVITE PO) Take 1 capsule by mouth daily.    . vitamin B-12 (CYANOCOBALAMIN) 1000 MCG tablet Take 1,000 mcg by mouth daily.     No current facility-administered medications on file prior to visit.     Review of Systems  Constitutional: Positive for fatigue. Negative for activity change, appetite change, fever and unexpected weight change.  HENT: Negative for congestion, ear pain, rhinorrhea, sinus pressure and sore throat.   Eyes: Negative for pain, redness and visual disturbance.  Respiratory: Negative for cough, shortness of breath and wheezing.   Cardiovascular: Negative for chest pain, palpitations and leg swelling.  Gastrointestinal: Negative for abdominal pain, blood in stool, constipation and diarrhea.  Endocrine: Negative for polydipsia and polyuria.  Genitourinary: Negative for dysuria, frequency and urgency.       Urinary incontinence  Musculoskeletal: Positive for back pain. Negative for arthralgias and myalgias.  Skin: Negative for pallor and rash.  Allergic/Immunologic: Negative for environmental allergies.  Neurological: Positive for weakness. Negative for dizziness, tremors, seizures, syncope, facial asymmetry, speech difficulty, light-headedness, numbness and headaches.  Hematological: Negative for adenopathy. Does not bruise/bleed easily.  Psychiatric/Behavioral: Positive for decreased concentration. Negative for dysphoric mood. The patient is not nervous/anxious.        Decreased memory and cognition with dementia        Objective:   Physical Exam  Constitutional: She appears well-developed and well-nourished. No distress.  Obese elderly female with dementia in wheelchair (for fall prevention  HENT:  Head: Normocephalic  and atraumatic.  Right Ear: External ear normal.  Left Ear: External ear normal.  Nose: Nose normal.  Mouth/Throat: Oropharynx is clear and moist.  Eyes: Pupils are equal, round, and reactive to light. Conjunctivae and EOM are normal. Right eye exhibits no discharge. Left eye exhibits no discharge. No scleral icterus.  Neck: Normal range of motion. Neck supple. No JVD present. Carotid bruit is not present. No thyromegaly present.  Cardiovascular: Normal rate, regular rhythm, normal heart sounds and intact distal pulses. Exam reveals no gallop.  Pulmonary/Chest: Effort normal and breath sounds normal. No respiratory distress. She has no wheezes. She has no rales.  Abdominal: Soft. Bowel sounds are normal. She exhibits no distension and no mass. There is no tenderness.  Genitourinary:  Genitourinary Comments: Declines breast exam or cancer screening  Musculoskeletal: She exhibits no edema or tenderness.  Lymphadenopathy:    She has no cervical adenopathy.  Neurological: She is alert. She has normal reflexes. She displays normal reflexes. No cranial nerve deficit. She exhibits normal muscle tone. Coordination normal.  Skin: Skin is warm and dry. No rash noted. No erythema. No pallor.  Some sks Large skin tag on L back unchanged  Psychiatric: She has a normal mood and affect. Her mood appears not anxious. Cognition and memory are impaired. She does not exhibit a depressed mood. She exhibits abnormal recent memory.  Pleasant and content  Generally confused- repeats herself           Assessment & Plan:   Problem List Items Addressed This Visit      Cardiovascular and Mediastinum   Carotid aneurysm, right ([place])    Followed by neurology  No symptoms       Relevant Medications   hydrochlorothiazide (HYDRODIURIL) 25 MG tablet   lisinopril (PRINIVIL,ZESTRIL) 20 MG tablet   metoprolol tartrate (LOPRESSOR) 25 MG tablet   Cerebellar stroke ([place])    Sees neurology  No new  findings Continues anticoagulation  Intolerant of statins At adv age- does not  want to be aggressive  PT helpful recently       Relevant Medications   hydrochlorothiazide (HYDRODIURIL) 25 MG tablet   lisinopril (PRINIVIL,ZESTRIL) 20 MG tablet   metoprolol tartrate (LOPRESSOR) 25 MG tablet   Essential hypertension    bp in fair control at this time  BP Readings from Last 1 Encounters:  04/17/18 140/78   No changes needed Most recent labs reviewed  Disc lifstyle change with low sodium diet and exercise  No hypotension      Relevant Medications   hydrochlorothiazide (HYDRODIURIL) 25 MG tablet   lisinopril (PRINIVIL,ZESTRIL) 20 MG tablet   metoprolol tartrate (LOPRESSOR) 25 MG tablet     Digestive   Chronic diarrhea    On and off with IBS Watching for diet triggers Much improved Not thought to be due to aricept at this time        Nervous and Auditory   Dementia ([place]) - Primary    Continues aricept and namenda from neurology Overall fairly stable to slowly progressing  Content currently         Musculoskeletal and Integument   Osteoporosis    Despite radius fx-pt still declines further eval or tx Intol of miacalcin and bisphosphonate  ? If candidate for prolia Good D level  Focus on fall prev        Other   HYPERCHOLESTEROLEMIA, PURE    Disc goals for lipids and reasons to control them Rev last labs with pt Rev low sat fat diet in detail Declines medication despite cva hx  Due to age and past intol of zocor      Relevant Medications   hydrochlorothiazide (HYDRODIURIL) 25 MG tablet   lisinopril (PRINIVIL,ZESTRIL) 20 MG tablet   metoprolol tartrate (LOPRESSOR) 25 MG tablet   Vitamin D deficiency    Level in 60s Continue current supplementation  Disc imp to bone and overall health        Other Visit Diagnoses    Need for influenza vaccination       Relevant Orders   Flu Vaccine QUAD 6+ mos PF IM (Fluarix Quad PF) (Completed)

## 2018-04-17 NOTE — Assessment & Plan Note (Signed)
Despite radius fx-pt still declines further eval or tx Intol of miacalcin and bisphosphonate  ? If candidate for prolia Good D level  Focus on fall prev

## 2018-04-17 NOTE — Patient Instructions (Signed)
I'm glad things are stable   Keep working on safe walking with a walker / continue what the physical therapist recommends even after they are done  Keep moving as long as you can !   Flu shot today   Try to get a balanced diet

## 2018-04-17 NOTE — Assessment & Plan Note (Signed)
Followed by neurology  No symptoms

## 2018-04-17 NOTE — Assessment & Plan Note (Signed)
Level in 60s Continue current supplementation  Disc imp to bone and overall health

## 2018-04-17 NOTE — Assessment & Plan Note (Signed)
Sees neurology  No new findings Continues anticoagulation  Intolerant of statins At adv age- does not want to be aggressive  PT helpful recently

## 2018-04-17 NOTE — Assessment & Plan Note (Signed)
Continues aricept and namenda from neurology Overall fairly stable to slowly progressing  Content currently

## 2018-04-21 DIAGNOSIS — M25552 Pain in left hip: Secondary | ICD-10-CM | POA: Diagnosis not present

## 2018-04-21 DIAGNOSIS — S52572D Other intraarticular fracture of lower end of left radius, subsequent encounter for closed fracture with routine healing: Secondary | ICD-10-CM | POA: Diagnosis not present

## 2018-04-21 DIAGNOSIS — R2681 Unsteadiness on feet: Secondary | ICD-10-CM | POA: Diagnosis not present

## 2018-04-21 DIAGNOSIS — M810 Age-related osteoporosis without current pathological fracture: Secondary | ICD-10-CM | POA: Diagnosis not present

## 2018-04-21 DIAGNOSIS — M1712 Unilateral primary osteoarthritis, left knee: Secondary | ICD-10-CM | POA: Diagnosis not present

## 2018-04-21 DIAGNOSIS — F0390 Unspecified dementia without behavioral disturbance: Secondary | ICD-10-CM | POA: Diagnosis not present

## 2018-05-21 ENCOUNTER — Other Ambulatory Visit: Payer: Self-pay

## 2018-05-22 ENCOUNTER — Encounter: Payer: Self-pay | Admitting: Family Medicine

## 2018-05-22 ENCOUNTER — Ambulatory Visit (INDEPENDENT_AMBULATORY_CARE_PROVIDER_SITE_OTHER): Payer: Medicare Other | Admitting: Family Medicine

## 2018-05-22 VITALS — BP 150/78 | HR 58 | Temp 97.7°F | Ht 60.0 in | Wt 166.0 lb

## 2018-05-22 DIAGNOSIS — R05 Cough: Secondary | ICD-10-CM | POA: Insufficient documentation

## 2018-05-22 DIAGNOSIS — S8001XA Contusion of right knee, initial encounter: Secondary | ICD-10-CM | POA: Insufficient documentation

## 2018-05-22 DIAGNOSIS — I10 Essential (primary) hypertension: Secondary | ICD-10-CM | POA: Diagnosis not present

## 2018-05-22 DIAGNOSIS — I6381 Other cerebral infarction due to occlusion or stenosis of small artery: Secondary | ICD-10-CM | POA: Diagnosis not present

## 2018-05-22 DIAGNOSIS — R059 Cough, unspecified: Secondary | ICD-10-CM | POA: Insufficient documentation

## 2018-05-22 MED ORDER — VALSARTAN 160 MG PO TABS: 160.0000 mg | ORAL_TABLET | Freq: Every day | ORAL | 3 refills | Status: DC

## 2018-05-22 NOTE — Progress Notes (Signed)
Subjective:    Patient ID: Wendy Krueger, female    DOB: August 21, 1929, 84 y.o.   MRN: [id number]  HPI  Here for c/o cough and also BP check   Wt Readings from Last 3 Encounters:  05/22/18 166 lb (75.3 kg)  04/13/18 163 lb 8 oz (74.2 kg)  03/11/18 167 lb (75.8 kg)   32.42 kg/m   Dry cough  ? If deep occasionally  Taking chloredin hbp Some throat clearing  No change in chronic runny nose  No uri symptoms or fever   Had a fall yesterday  On R knee Abraded and bruised  Hurt to bend this am  Has been able to bear weight on it today    bp is up today  No headache Is occ light headed - and bad balance in general  Has been going up at home (172/107 this am)  No cp or palpitations or headaches or edema  No side effects to medicines  BP Readings from Last 3 Encounters:  05/22/18 (!) 150/78  04/17/18 140/78  04/13/18 132/88     Metoprolol 25 mg bid Lisinopril 20 mg daily hctz 25 mg daily  (also K) Tried amlodipine-it gave her hives   Pulse Readings from Last 3 Encounters:  05/22/18 (!) 58  04/17/18 (!) 58  04/13/18 (!) 53   Patient Active Problem List   Diagnosis Date Noted  . Cough 05/22/2018  . Contusion of right knee 05/22/2018  . Chronic diarrhea 02/17/2018  . Distal radius fracture, left 02/17/2018  . Hypokalemia 02/09/2018  . History of lower GI bleeding 02/09/2018  . Cerebellar stroke ([place]) 03/10/2016  . Carotid aneurysm, right ([place]) 02/21/2016  . Lipoma of shoulder 03/27/2015  . History of closed Colles' fracture 09/06/2014  . Allergic rhinitis 06/20/2014  . Diastolic dysfunction 02/72/5366  . History of GI bleed 03/21/2014  . Dementia ([place]) 09/01/2013  . Memory loss 09/01/2013  . Anxiety 09/01/2013  . Encounter for Medicare annual wellness exam 01/08/2013  . Gallstones 11/11/2011  . 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 Risk for falls 06/04/2011  . Constipation 03/05/2011  . Vitamin D deficiency 03/28/2009  . HYPERCHOLESTEROLEMIA,  PURE 03/17/2007  . Essential hypertension 03/16/2007  . OSTEOARTHRITIS 03/16/2007  . Osteoporosis 03/16/2007   Past Medical History:  Diagnosis Date  . Asymptomatic gallstones   . BRBPR (bright red blood per rectum)   . Cataract 04/13/2018   bilateral eyes  . Cholelithiasis   . Colitis - presumed infectious origin    one ER visit   . Dementia ([place])   . Duodenitis   . Fall   . Gastritis   . GI bleed   . Hiatal hernia   . Hyperlipidemia   . Hypertension   . Osteoarthritis   . Osteoporosis   . Stroke ([place])   . Syncope    Past Surgical History:  Procedure Laterality Date  . ABDOMINAL HYSTERECTOMY     BSO- fibroids  . ABI's     normal  . dexa  1/05   osteoporosis  . ESOPHAGOGASTRODUODENOSCOPY N/A 03/21/2014   Procedure: ESOPHAGOGASTRODUODENOSCOPY (EGD);  Surgeon: Milus Banister, MD;  Location: [facility];  Service: Endoscopy;  Laterality: N/A;  . left foot brace    . WRIST FRACTURE SURGERY  2/01   R arm  . WRIST FRACTURE SURGERY Left 02/12/2018   Social History   Tobacco Use  . Smoking status: Never Smoker  . Smokeless tobacco: Never Used  Substance Use Topics  .  Alcohol use: No    Alcohol/week: 0.0 standard drinks  . Drug use: No   Family History  Problem Relation Age of Onset  . Heart attack Father   . Hypertension Father   . Heart attack Mother   . Throat cancer Brother   . Breast cancer Daughter    Allergies  Allergen Reactions  . Alendronate Sodium Other (See Comments)    Leg pain   . Amlodipine Besylate Hives  . Calcitonin ([place]) Other (See Comments)     headache/ head pressure  . Simvastatin Other (See Comments)    Leg pain    Current Outpatient Medications on File Prior to Visit  Medication Sig Dispense Refill  . Cholecalciferol (VITAMIN D-3) 1000 units CAPS Take 1 capsule See admin instructions by mouth. Take 1000 units in the morning and 2000 in the evening    . clopidogrel (PLAVIX) 75 MG tablet TAKE 1 TABLET BY MOUTH EVERY DAY 30  tablet 11  . donepezil (ARICEPT) 10 MG tablet TAKE 1 TABLET (10 MG TOTAL) BY MOUTH AT BEDTIME. 30 tablet 11  . Fexofenadine HCl (ALLERGY 24-HR PO) Take 1 capsule by mouth daily.    . hydrochlorothiazide (HYDRODIURIL) 25 MG tablet Take 1 tablet (25 mg total) by mouth daily. 90 tablet 3  . hydrocortisone (ANUSOL-HC) 2.5 % rectal cream Place rectally 3 (three) times daily. 30 g 0  . memantine (NAMENDA) 10 MG tablet TAKE 1 TABLET (10 MG TOTAL) BY MOUTH 2 (TWO) TIMES DAILY. 60 tablet 11  . metoprolol tartrate (LOPRESSOR) 25 MG tablet Take 1 tablet (25 mg total) by mouth 2 (two) times daily. 180 tablet 3  . Multiple Vitamins-Minerals ([facility] SPECTRAVITE PO) Take 1 capsule by mouth daily.    . potassium chloride (KLOR-CON M10) 10 MEQ tablet Take 2 tablets (20 mEq total) by mouth daily. 180 tablet 3  . vitamin B-12 (CYANOCOBALAMIN) 1000 MCG tablet Take 1,000 mcg by mouth daily.     No current facility-administered medications on file prior to visit.     Review of Systems  Constitutional: Negative for activity change, appetite change, fatigue, fever and unexpected weight change.  HENT: Negative for congestion, ear pain, rhinorrhea, sinus pressure and sore throat.   Eyes: Negative for pain, redness and visual disturbance.  Respiratory: Positive for cough. Negative for choking, chest tightness, shortness of breath, wheezing and stridor.   Cardiovascular: Negative for chest pain and palpitations.  Gastrointestinal: Negative for abdominal pain, blood in stool, constipation and diarrhea.  Endocrine: Negative for polydipsia and polyuria.  Genitourinary: Negative for dysuria, frequency and urgency.  Musculoskeletal: Positive for arthralgias. Negative for back pain and myalgias.  Skin: Positive for wound. Negative for pallor and rash.  Allergic/Immunologic: Negative for environmental allergies.  Neurological: Negative for dizziness, syncope and headaches.       Poor balance  Hematological: Negative for  adenopathy. Does not bruise/bleed easily.  Psychiatric/Behavioral: Negative for decreased concentration and dysphoric mood. The patient is not nervous/anxious.        Mod to severe dementia        Objective:   Physical Exam  Constitutional: She appears well-developed and well-nourished. No distress.  Frail appearing elderly female with dementia in wheelchair   HENT:  Head: Normocephalic and atraumatic.  Mouth/Throat: Oropharynx is clear and moist.  Eyes: Pupils are equal, round, and reactive to light. Conjunctivae and EOM are normal.  Neck: Normal range of motion. Neck supple. No JVD present. Carotid bruit is not present. No thyromegaly present.  Cardiovascular:  Normal rate, regular rhythm, normal heart sounds and intact distal pulses. Exam reveals no gallop.  Pulmonary/Chest: Effort normal and breath sounds normal. No stridor. No respiratory distress. She has no wheezes. She has no rales. She exhibits no tenderness.  No crackles  Abdominal: Soft. Bowel sounds are normal. She exhibits no distension, no abdominal bruit and no mass. There is no tenderness.  Musculoskeletal: She exhibits no edema.  R knee- soft tissue swelling and mild ecchymosis over patella Pain to flex more than 90 degrees  No instability  No crepitus No bony tenderness  Lymphadenopathy:    She has no cervical adenopathy.  Neurological: She is alert. She has normal reflexes. She displays normal reflexes. No cranial nerve deficit.  Poor balance  Skin: Skin is warm and dry. No rash noted. No erythema.  Small abrasion on R knee  Clean and not infected looking  Psychiatric: She has a normal mood and affect.  Dementia- occasionally nods head and repeats herself  Keeps had fingers in her mouth          Assessment & Plan:   Problem List Items Addressed This Visit      Cardiovascular and Mediastinum   Essential hypertension - Primary    bp has been trending up  BP: (!) 150/78    Also -possible ace  cough Will transition to ARB (valsartan) 160 mg daily  Has caregiver to watch at home  Alert if side eff or no improvement F/u 1-2 mo      Relevant Medications   valsartan (DIOVAN) 160 MG tablet     Other   Cough    W/o other assoc symptoms Suspect ace cough  Will change lisinopril to valsartan 160 mg  F/u planned inst to call if no imp or worse (or other symptoms like phlegm or fever) Re assuring exam       Contusion of right knee    After fall directly on it yesterday (? If twisted)  Some anterior ecchymosis and abrasion and swelling vs effusion  Not much tenderness but hesitant to flex it   Adv relative rest/elevatoin/ice and acetaminophen as needed  Can re eval in 1-2 weeks if not improved (once inflammation goes down)  Urged family to monitor full time for falls along with caregiver

## 2018-05-22 NOTE — Assessment & Plan Note (Signed)
bp has been trending up  BP: (!) 150/78    Also -possible ace cough Will transition to ARB (valsartan) 160 mg daily  Has caregiver to watch at home  Alert if side eff or no improvement F/u 1-2 mo

## 2018-05-22 NOTE — Patient Instructions (Addendum)
The right knee is contused  Keep the abrasion clean with soap and water  Ice whenever possible 20 min on / 20 off  Elevate leg when able also  If no improvement in the next 2 weeks (while swelling goes down) let me know  Stop lisinopril (? Causing cough)  Start valsartan instead If any side effects or problems let me know  If cough does not improve let me know   (especially if other symptoms or congestion or fever)   Follow up in 1-2 months for a blood pressure visit

## 2018-05-22 NOTE — Assessment & Plan Note (Signed)
After fall directly on it yesterday (? If twisted)  Some anterior ecchymosis and abrasion and swelling vs effusion  Not much tenderness but hesitant to flex it   Adv relative rest/elevatoin/ice and acetaminophen as needed  Can re eval in 1-2 weeks if not improved (once inflammation goes down)  Urged family to monitor full time for falls along with caregiver

## 2018-05-22 NOTE — Assessment & Plan Note (Signed)
W/o other assoc symptoms Suspect ace cough  Will change lisinopril to valsartan 160 mg  F/u planned inst to call if no imp or worse (or other symptoms like phlegm or fever) Re assuring exam

## 2018-06-10 ENCOUNTER — Other Ambulatory Visit: Payer: Self-pay | Admitting: Neurology

## 2018-07-14 ENCOUNTER — Ambulatory Visit: Payer: Medicare Other | Admitting: Family Medicine

## 2018-07-14 ENCOUNTER — Encounter: Payer: Self-pay | Admitting: Family Medicine

## 2018-07-14 ENCOUNTER — Ambulatory Visit (INDEPENDENT_AMBULATORY_CARE_PROVIDER_SITE_OTHER): Payer: Medicare Other | Admitting: Family Medicine

## 2018-07-14 VITALS — BP 146/100 | HR 58 | Temp 98.2°F | Ht 60.0 in | Wt 168.2 lb

## 2018-07-14 DIAGNOSIS — R350 Frequency of micturition: Secondary | ICD-10-CM | POA: Diagnosis not present

## 2018-07-14 DIAGNOSIS — I10 Essential (primary) hypertension: Secondary | ICD-10-CM

## 2018-07-14 DIAGNOSIS — F0150 Vascular dementia without behavioral disturbance: Secondary | ICD-10-CM

## 2018-07-14 DIAGNOSIS — R404 Transient alteration of awareness: Secondary | ICD-10-CM | POA: Insufficient documentation

## 2018-07-14 LAB — POC URINALSYSI DIPSTICK (AUTOMATED)
Bilirubin, UA: NEGATIVE
Blood, UA: NEGATIVE
Glucose, UA: NEGATIVE
Ketones, UA: NEGATIVE
Nitrite, UA: NEGATIVE
Protein, UA: NEGATIVE
Spec Grav, UA: 1.01 (ref 1.010–1.025)
Urobilinogen, UA: 0.2 E.U./dL
pH, UA: 7 (ref 5.0–8.0)

## 2018-07-14 NOTE — Assessment & Plan Note (Signed)
BP remaining elevated today despite compliance with current regimen. Bradycardia limits beta blocker dosing. Check labs today, I did ask family to monitor BP a few times a week over next 1-2 wks and update [place] with readings. Consider increased valsartan dosing if BP remaining high. Want to be cautious with over-treatment in this elderly patient.

## 2018-07-14 NOTE — Assessment & Plan Note (Addendum)
New episode of night time AMS over last 2 days, previously related to UTI. UA today with 3+ LA, no blood or nitrates. Microscopy not too suspicious for infection however. Will send UCx and treat accordingly. Check labs today for further evaluation (CBC, CMP, TSH).

## 2018-07-14 NOTE — Assessment & Plan Note (Addendum)
Thought due to vascular dementia, on aricept and namenda followed by neurology.

## 2018-07-14 NOTE — Patient Instructions (Addendum)
Labs today. Try to collect urine sample if able, if not then collect at home.  Start checking blood pressures more regularly at home a few times a week and call us with readings in 1 week.  We may increase blood pressure medicine dose depending on blood work and home readings.

## 2018-07-14 NOTE — Progress Notes (Signed)
BP (!) 146/100 (BP Location: Left Arm, Patient Position: Sitting, Cuff Size: Normal)   Pulse (!) 58   Temp 98.2 F (36.8 C) (Oral)   Ht 5' (1.524 m)   Wt 168 lb 4 oz (76.3 kg)   SpO2 96%   BMI 32.86 kg/m   BP Readings from Last 3 Encounters:  07/14/18 (!) 146/100  05/22/18 (!) 150/78  04/17/18 140/78    CC:  HTN f/u, ?UTI Subjective:    Patient ID: Wendy Krueger, female    DOB: 04-29-1930, 85 y.o.   MRN: [id number]  HPI: Wendy Krueger is a 85 y.o. female presenting on 07/14/2018 for Hypertension (Here for 1-2 mo f/u. Pt accompanied by her grandson, Aleene Davidson and daughter, [name]. ) and Possible UTI (Pt pt's daughter, pt woke last night talking "out of it". States usually pt has UTI when this happens. )   Lives with grandsons.   ?UTI - yesterday morning when she woke up grandsons noted increased confusion, talking about her deceased mother and grandmother, AMS. Denies dysuria, fevers/chills, abd pain, back pain, nausea/vomiting. No new urinary accidents. No unilateral weakness, slurred speech.   No recent UTI.  Missed bucket when collecting urine today - will retry in office.  HTN - Compliant with current antihypertensive regimen of hctz 25mg  daily, metoprolol 25mg  bid, divoan 160mg  daily. Does not check blood pressures at home. No low blood pressure readings or symptoms of dizziness/syncope. Denies HA, vision changes, CP/tightness, SOB, leg swelling. They do try to limit salt /sodium in diet at home. Lab Results  Component Value Date   CREATININE 1.18 02/17/2018   BUN 18 02/17/2018   NA 137 02/17/2018   K 3.9 02/17/2018   CL 102 02/17/2018   CO2 28 02/17/2018   Known dementia - on aricept and namenda.      Relevant past medical, surgical, family and social history reviewed and updated as indicated. Interim medical history since our last visit reviewed. Allergies and medications reviewed and updated. Outpatient Medications Prior to Visit  Medication Sig Dispense  Refill  . Cholecalciferol (VITAMIN D-3) 1000 units CAPS Take 1 capsule See admin instructions by mouth. Take 1000 units in the morning and 2000 in the evening    . clopidogrel (PLAVIX) 75 MG tablet TAKE 1 TABLET BY MOUTH EVERY DAY 30 tablet 11  . donepezil (ARICEPT) 10 MG tablet TAKE 1 TABLET (10 MG TOTAL) BY MOUTH AT BEDTIME. 30 tablet 11  . Fexofenadine HCl (ALLERGY 24-HR PO) Take 1 capsule by mouth daily.    . hydrochlorothiazide (HYDRODIURIL) 25 MG tablet Take 1 tablet (25 mg total) by mouth daily. 90 tablet 3  . hydrocortisone (ANUSOL-HC) 2.5 % rectal cream Place rectally 3 (three) times daily. 30 g 0  . memantine (NAMENDA) 10 MG tablet TAKE 1 TABLET (10 MG TOTAL) BY MOUTH 2 (TWO) TIMES DAILY. 60 tablet 11  . metoprolol tartrate (LOPRESSOR) 25 MG tablet Take 1 tablet (25 mg total) by mouth 2 (two) times daily. 180 tablet 3  . Multiple Vitamins-Minerals ([employer] SPECTRAVITE PO) Take 1 capsule by mouth daily.    . potassium chloride (KLOR-CON M10) 10 MEQ tablet Take 2 tablets (20 mEq total) by mouth daily. 180 tablet 3  . valsartan (DIOVAN) 160 MG tablet Take 1 tablet (160 mg total) by mouth daily. 90 tablet 3  . vitamin B-12 (CYANOCOBALAMIN) 1000 MCG tablet Take 1,000 mcg by mouth daily.     No facility-administered medications prior to visit.  Per HPI unless specifically indicated in ROS section below Review of Systems Objective:    BP (!) 146/100 (BP Location: Left Arm, Patient Position: Sitting, Cuff Size: Normal)   Pulse (!) 58   Temp 98.2 F (36.8 C) (Oral)   Ht 5' (1.524 m)   Wt 168 lb 4 oz (76.3 kg)   SpO2 96%   BMI 32.86 kg/m   Wt Readings from Last 3 Encounters:  07/14/18 168 lb 4 oz (76.3 kg)  05/22/18 166 lb (75.3 kg)  04/13/18 163 lb 8 oz (74.2 kg)    Physical Exam Vitals signs and nursing note reviewed.  Constitutional:      General: She is not in acute distress.    Appearance: Normal appearance. She is obese. She is not ill-appearing.  HENT:     Head:  Normocephalic and atraumatic.     Mouth/Throat:     Mouth: Mucous membranes are moist.     Pharynx: Oropharynx is clear.  Cardiovascular:     Rate and Rhythm: Normal rate and regular rhythm.     Pulses: Normal pulses.     Heart sounds: Normal heart sounds. No murmur.  Pulmonary:     Effort: Pulmonary effort is normal. No respiratory distress.     Breath sounds: Normal breath sounds. No wheezing, rhonchi or rales.     Comments: Few bibasilar crackles Abdominal:     General: Abdomen is flat. There is no distension.     Palpations: Abdomen is soft. There is no hepatomegaly, splenomegaly or mass.     Tenderness: There is no abdominal tenderness. There is no right CVA tenderness, left CVA tenderness, guarding or rebound. Negative signs include Murphy's sign.  Skin:    General: Skin is warm and dry.  Neurological:     Mental Status: She is alert.       Results for orders placed or performed in visit on 07/14/18  POCT Urinalysis Dipstick (Automated)  Result Value Ref Range   Color, UA yellow    Clarity, UA clear    Glucose, UA Negative Negative   Bilirubin, UA negative    Ketones, UA negative    Spec Grav, UA 1.010 1.010 - 1.025   Blood, UA negative    pH, UA 7.0 5.0 - 8.0   Protein, UA Negative Negative   Urobilinogen, UA 0.2 0.2 or 1.0 E.U./dL   Nitrite, UA negative    Leukocytes, UA Large (3+) (A) Negative   Assessment & Plan:   Problem List Items Addressed This Visit    Transient alteration of awareness - Primary    New episode of night time AMS over last 2 days, previously related to UTI. UA today with 3+ LA, no blood or nitrates. Microscopy not too suspicious for infection however. Will send UCx and treat accordingly. Check labs today for further evaluation (CBC, CMP, TSH).       Relevant Orders   TSH   Comprehensive metabolic panel   CBC with Differential/Platelet   POCT Urinalysis Dipstick (Automated) (Completed)   Urine Culture   Essential hypertension    BP  remaining elevated today despite compliance with current regimen. Bradycardia limits beta blocker dosing. Check labs today, I did ask family to monitor BP a few times a week over next 1-2 wks and update [place] with readings. Consider increased valsartan dosing if BP remaining high. Want to be cautious with over-treatment in this elderly patient.       Relevant Orders   TSH   Comprehensive  metabolic panel   Dementia ([place])    Thought due to vascular dementia, on aricept and namenda followed by neurology.       Relevant Orders   TSH   Comprehensive metabolic panel   CBC with Differential/Platelet    Other Visit Diagnoses    Urine frequency       Relevant Orders   Urine Culture       No orders of the defined types were placed in this encounter.  Orders Placed This Encounter  Procedures  . Urine Culture  . TSH  . Comprehensive metabolic panel  . CBC with Differential/Platelet  . POCT Urinalysis Dipstick (Automated)    Follow up plan: Return if symptoms worsen or fail to improve.  Ria Bush, MD

## 2018-07-15 ENCOUNTER — Telehealth: Payer: Self-pay

## 2018-07-15 DIAGNOSIS — R350 Frequency of micturition: Secondary | ICD-10-CM | POA: Diagnosis not present

## 2018-07-15 DIAGNOSIS — R404 Transient alteration of awareness: Secondary | ICD-10-CM | POA: Diagnosis not present

## 2018-07-15 LAB — COMPREHENSIVE METABOLIC PANEL
ALT: 12 U/L (ref 0–35)
AST: 19 U/L (ref 0–37)
Albumin: 4 g/dL (ref 3.5–5.2)
Alkaline Phosphatase: 60 U/L (ref 39–117)
BUN: 15 mg/dL (ref 6–23)
CO2: 28 mEq/L (ref 19–32)
Calcium: 10.1 mg/dL (ref 8.4–10.5)
Chloride: 102 mEq/L (ref 96–112)
Creatinine, Ser: 1.15 mg/dL (ref 0.40–1.20)
GFR: 47.47 mL/min — ABNORMAL LOW (ref >60.00)
Glucose, Bld: 82 mg/dL (ref 70–99)
Potassium: 4.2 mEq/L (ref 3.5–5.1)
Sodium: 138 mEq/L (ref 135–145)
Total Bilirubin: 0.3 mg/dL (ref 0.2–1.2)
Total Protein: 6.9 g/dL (ref 6.0–8.3)

## 2018-07-15 LAB — CBC WITH DIFFERENTIAL/PLATELET
BASOS PCT: 1.4 % (ref 0.0–3.0)
Basophils Absolute: 0.1 10*3/uL (ref 0.0–0.1)
EOS ABS: 0.2 10*3/uL (ref 0.0–0.7)
Eosinophils Relative: 4.6 % (ref 0.0–5.0)
HCT: 35.7 % — ABNORMAL LOW (ref 36.0–46.0)
Hemoglobin: 11.7 g/dL — ABNORMAL LOW (ref 12.0–15.0)
Lymphocytes Relative: 32.4 % (ref 12.0–46.0)
Lymphs Abs: 1.3 10*3/uL (ref 0.7–4.0)
MCHC: 32.8 g/dL (ref 30.0–36.0)
MCV: 86.4 fl (ref 78.0–100.0)
Monocytes Absolute: 0.4 10*3/uL (ref 0.1–1.0)
Monocytes Relative: 10.5 % (ref 3.0–12.0)
Neutro Abs: 2.1 10*3/uL (ref 1.4–7.7)
Neutrophils Relative %: 51.1 % (ref 43.0–77.0)
Platelets: 208 10*3/uL (ref 150.0–400.0)
RBC: 4.13 Mil/uL (ref 3.87–5.11)
RDW: 14.9 % (ref 11.5–15.5)
WBC: 4.1 10*3/uL (ref 4.0–10.5)

## 2018-07-15 LAB — TSH: TSH: 2.34 u[IU]/mL (ref 0.35–4.50)

## 2018-07-15 NOTE — Telephone Encounter (Signed)
Spoke with pt's daughter, [name] (on dpr), relaying results and message per Dr. Coralie Keens urine dip, 07/14/17.].  Verbalizes understanding.

## 2018-07-15 NOTE — Telephone Encounter (Signed)
Wendy Krueger,daughter,returned call.  Please calll Wendy Rowan back at [phone].

## 2018-07-15 NOTE — Telephone Encounter (Signed)
Left message for patient to call back  

## 2018-07-16 LAB — URINE CULTURE
MICRO NUMBER:: 59273
SPECIMEN QUALITY:: ADEQUATE

## 2018-07-20 ENCOUNTER — Ambulatory Visit (INDEPENDENT_AMBULATORY_CARE_PROVIDER_SITE_OTHER): Payer: Medicare Other | Admitting: Family Medicine

## 2018-07-20 ENCOUNTER — Telehealth: Payer: Self-pay | Admitting: Family Medicine

## 2018-07-20 ENCOUNTER — Encounter: Payer: Self-pay | Admitting: Family Medicine

## 2018-07-20 VITALS — BP 180/82 | HR 63 | Temp 97.4°F | Ht 60.0 in | Wt 169.0 lb

## 2018-07-20 DIAGNOSIS — I10 Essential (primary) hypertension: Secondary | ICD-10-CM

## 2018-07-20 MED ORDER — VALSARTAN 160 MG PO TABS: 160.0000 mg | ORAL_TABLET | Freq: Two times a day (BID) | ORAL | 0 refills | Status: DC

## 2018-07-20 NOTE — Assessment & Plan Note (Signed)
Will increase valsartan to 160 mg BID. Recommended checking BP and follow-up in 1-2 weeks if still elevated. No red flag signs to suggest end organ damage.

## 2018-07-20 NOTE — Patient Instructions (Signed)
#   Hypertension - increase Valsartan to 160 mg twice daily - continue to monitor blood pressure at home - return to see PCP in 1-2 weeks if blood pressure still elevated - CALL: if headache, weakness, shortness of breath, chest pain develop

## 2018-07-20 NOTE — Telephone Encounter (Signed)
[name] (daughter) [phone] Daughter called to make appointment for bp  Made appointment with dr cody today @ 2:20 Pt bp has been running  07/16/2018  Am 179/94   Pm 160/83  07/17/2018  Am 202/94 and 183/99 both in am Pm 190/86  1/18/20020 am 181/116  Waited 1 hour after med  185/114 Pm 139/95  07/19/2018 am 167/89  Pm 194/98

## 2018-07-20 NOTE — Telephone Encounter (Signed)
Aware-thanks to Dr Einar Pheasant for seeing her  I will wait for an update

## 2018-07-20 NOTE — Progress Notes (Signed)
   Subjective:     Wendy Krueger is a 85 y.o. female presenting for Hypertension (has been getting elevated b/p readings. No headaches, no chest pain. Some dizziness but this is normal for the patient.)     HPI  #HTN - blood pressure has been elevated - feels well - goes to sleep well  - taking all her medications - managed by daughter - takes metoprolol BID  Daughter reports hx of passing out  Review of Systems  Eyes: Negative for visual disturbance.  Respiratory: Negative for cough, chest tightness and shortness of breath.   Cardiovascular: Negative for chest pain, palpitations and leg swelling.  Neurological: Negative for dizziness, light-headedness and headaches.     07/14/2018: Clinic - seen for concern for transient alteration of awareness. Noted to be hypertensive already on 3 meds.   [place] called and BP 167-202/89-116   Social History   Tobacco Use  Smoking Status Never Smoker  Smokeless Tobacco Never Used        Objective:    BP Readings from Last 3 Encounters:  07/20/18 (!) 180/82  07/14/18 (!) 146/100  05/22/18 (!) 150/78   Wt Readings from Last 3 Encounters:  07/20/18 169 lb (76.7 kg)  07/14/18 168 lb 4 oz (76.3 kg)  05/22/18 166 lb (75.3 kg)    BP (!) 180/82   Pulse 63   Temp (!) 97.4 F (36.3 C)   Ht 5' (1.524 m)   Wt 169 lb (76.7 kg)   SpO2 98%   BMI 33.01 kg/m    Physical Exam Constitutional:      General: She is not in acute distress.    Appearance: She is well-developed. She is not diaphoretic.     Comments: Happy, no distress  HENT:     Right Ear: External ear normal.     Left Ear: External ear normal.     Nose: Nose normal.  Eyes:     Conjunctiva/sclera: Conjunctivae normal.  Neck:     Musculoskeletal: Neck supple.  Cardiovascular:     Rate and Rhythm: Normal rate and regular rhythm.     Heart sounds: No murmur.  Pulmonary:     Effort: Pulmonary effort is normal. No respiratory distress.     Breath sounds:  Normal breath sounds. No wheezing.  Musculoskeletal:     Right lower leg: No edema.     Left lower leg: No edema.     Comments: Ambulates with walker  Skin:    General: Skin is warm and dry.     Capillary Refill: Capillary refill takes less than 2 seconds.  Neurological:     Mental Status: She is alert. Mental status is at baseline.  Psychiatric:        Mood and Affect: Mood normal.        Behavior: Behavior normal.     Comments: "I'm still alive"           Assessment & Plan:   Problem List Items Addressed This Visit      Cardiovascular and Mediastinum   Essential hypertension - Primary    Will increase valsartan to 160 mg BID. Recommended checking BP and follow-up in 1-2 weeks if still elevated. No red flag signs to suggest end organ damage.      Relevant Medications   valsartan (DIOVAN) 160 MG tablet       Return in about 1 week (around 07/27/2018) for blood pressure if still elevated.  Lesleigh Noe, MD

## 2018-07-26 ENCOUNTER — Other Ambulatory Visit: Payer: Self-pay | Admitting: Neurology

## 2018-07-29 ENCOUNTER — Encounter: Payer: Self-pay | Admitting: Adult Health

## 2018-07-29 ENCOUNTER — Ambulatory Visit (INDEPENDENT_AMBULATORY_CARE_PROVIDER_SITE_OTHER): Payer: Medicare Other | Admitting: Adult Health

## 2018-07-29 VITALS — BP 138/92 | HR 76 | Ht 60.0 in | Wt 169.0 lb

## 2018-07-29 DIAGNOSIS — F0150 Vascular dementia without behavioral disturbance: Secondary | ICD-10-CM | POA: Diagnosis not present

## 2018-07-29 DIAGNOSIS — I720 Aneurysm of carotid artery: Secondary | ICD-10-CM

## 2018-07-29 NOTE — Patient Instructions (Signed)
Please continue Aricept and Namenda as prescribed.   I would suggest calling to see an ophthalmologist to see if they are able to assist with eye exam.   Fall precautions discussed.    Alzheimer Disease Caregiver Guide  Alzheimer disease causes a person to lose the ability to remember things and make decisions. A person who has Alzheimer disease may not be able to take care of himself or herself. He or she may need help with simple tasks. The tips below can help you care for the person. What kind of changes does this condition cause? This condition makes a person:  Forget things.  Feel confused.  Act differently.  Have different moods. These things get worse with time. Tips to help with symptoms  Be calm and patient.  Respond with a simple, short answer.  Avoid correcting the person in a negative way.  Try not to take things personally, even if the person forgets your name.  Do not argue with the person. This may make the person more upset. Tips to lessen frustration  Make appointments and do daily tasks when the person is at his or her best.  Take your time. Simple tasks may take longer. Allow plenty of time to complete tasks.  Limit choices for the person.  Involve the person in what you are doing.  Keep a daily routine.  Avoid new or crowded places, if possible.  Use simple words, short sentences, and a calm voice. Only give one direction at a time.  Buy clothes and shoes that are easy to put on and take off.  Organize medicines in a pillbox for each day of the week.  Keep a calendar in a central location to remind the person of meetings or other activities.  Let people help if they offer. Take a break when needed. Tips to prevent injury  Keep floors clear. Remove rugs, magazine racks, and floor lamps.  Keep hallways well-lit.  Put a handrail and non-slip mat in the bathtub or shower.  Put childproof locks on cabinets that have dangerous items in  them. These items include medicine, alcohol, guns, toxic cleaning items, sharp tools, matches, and lighters.  Put locks on doors where the person cannot see or reach them. This helps the person to not wander out of the house and get lost.  Be prepared for emergencies. Keep a list of emergency phone numbers and addresses close by.  Bracelets may be worn that track location and identify the person as having memory problems. This should be worn at all times for safety. Tips for the future  Discuss financial and legal planning early. People with this disease have trouble managing their money as the disease gets worse. Get help from a professional.  Talk about advance directives, safety, and daily care. Take these steps: ? Create a living will and choose a power of attorney. This is someone who can make decisions for the person with Alzheimer disease when he or she can no longer do so. ? Discuss driving safety and when to stop driving. The person's doctor can help with this. ? If the person lives alone, make sure he or she is safe. Some people need extra help at home. Other people need more care at a nursing home or care center. Where to find support You can find support by joining a support group near you. Some benefits of joining a support group include:  Learning ways to manage stress.  Sharing experiences with others.  Getting emotional  comfort and support.  Learning about caregiving as the disease progresses.  Knowing what community resources are available and making use of them. Where to find more information  Alzheimer's Association: [employer].co.nz Contact a doctor if:  The person has a fever.  The person has a sudden behavior change that does not get better with calming strategies.  The person is not able to take care of himself or herself at home.  The person threatens you or anyone else, including himself or herself.  You are no longer able to care for the  person. Summary  Alzheimer disease causes a person to forget things and to be confused.  A person who has this condition may not be able to take care of himself or herself.  Take steps to keep the person from getting hurt. Plan for future care.  You can find support by joining a support group near you. This information is not intended to replace advice given to you by your health care provider. Make sure you discuss any questions you have with your health care provider. Document Released: 09/09/2011 Document Revised: 06/12/2017 Document Reviewed: 06/12/2017 Elsevier Interactive Patient Education  2019 [place] in the Home, Adult Falls can cause injuries. They can happen to people of all ages. There are many things you can do to make your home safe and to help prevent falls. Ask for help when making these changes, if needed. What actions can I take to prevent falls? General Instructions  Use good lighting in all rooms. Replace any light bulbs that burn out.  Turn on the lights when you go into a dark area. Use night-lights.  Keep items that you use often in easy-to-reach places. Lower the shelves around your home if necessary.  Set up your furniture so you have a clear path. Avoid moving your furniture around.  Do not have throw rugs and other things on the floor that can make you trip.  Avoid walking on wet floors.  If any of your floors are uneven, fix them.  Add color or contrast paint or tape to clearly mark and help you see: ? Any grab bars or handrails. ? First and last steps of stairways. ? Where the edge of each step is.  If you use a stepladder: ? Make sure that it is fully opened. Do not climb a closed stepladder. ? Make sure that both sides of the stepladder are locked into place. ? Ask someone to hold the stepladder for you while you use it.  If there are any pets around you, be aware of where they are. What can I do in the bathroom?       Keep the floor dry. Clean up any water that spills onto the floor as soon as it happens.  Remove soap buildup in the tub or shower regularly.  Use non-skid mats or decals on the floor of the tub or shower.  Attach bath mats securely with double-sided, non-slip rug tape.  If you need to sit down in the shower, use a plastic, non-slip stool.  Install grab bars by the toilet and in the tub and shower. Do not use towel bars as grab bars. What can I do in the bedroom?  Make sure that you have a light by your bed that is easy to reach.  Do not use any sheets or blankets that are too big for your bed. They should not hang down onto the floor.  Have a firm chair that  has side arms. You can use this for support while you get dressed. What can I do in the kitchen?  Clean up any spills right away.  If you need to reach something above you, use a strong step stool that has a grab bar.  Keep electrical cords out of the way.  Do not use floor polish or wax that makes floors slippery. If you must use wax, use non-skid floor wax. What can I do with my stairs?  Do not leave any items on the stairs.  Make sure that you have a light switch at the top of the stairs and the bottom of the stairs. If you do not have them, ask someone to add them for you.  Make sure that there are handrails on both sides of the stairs, and use them. Fix handrails that are broken or loose. Make sure that handrails are as long as the stairways.  Install non-slip stair treads on all stairs in your home.  Avoid having throw rugs at the top or bottom of the stairs. If you do have throw rugs, attach them to the floor with carpet tape.  Choose a carpet that does not hide the edge of the steps on the stairway.  Check any carpeting to make sure that it is firmly attached to the stairs. Fix any carpet that is loose or worn. What can I do on the outside of my home?  Use bright outdoor lighting.  Regularly fix the  edges of walkways and driveways and fix any cracks.  Remove anything that might make you trip as you walk through a door, such as a raised step or threshold.  Trim any bushes or trees on the path to your home.  Regularly check to see if handrails are loose or broken. Make sure that both sides of any steps have handrails.  Install guardrails along the edges of any raised decks and porches.  Clear walking paths of anything that might make someone trip, such as tools or rocks.  Have any leaves, snow, or ice cleared regularly.  Use sand or salt on walking paths during winter.  Clean up any spills in your garage right away. This includes grease or oil spills. What other actions can I take?  Wear shoes that: ? Have a low heel. Do not wear high heels. ? Have rubber bottoms. ? Are comfortable and fit you well. ? Are closed at the toe. Do not wear open-toe sandals.  Use tools that help you move around (mobility aids) if they are needed. These include: ? Canes. ? Walkers. ? Scooters. ? Crutches.  Review your medicines with your doctor. Some medicines can make you feel dizzy. This can increase your chance of falling. Ask your doctor what other things you can do to help prevent falls. Where to find more information  Centers for Disease Control and Prevention, STEADI: https://garcia.biz/  [employer] on Aging: BrainJudge.co.uk Contact a doctor if:  You are afraid of falling at home.  You feel weak, drowsy, or dizzy at home.  You fall at home. Summary  There are many simple things that you can do to make your home safe and to help prevent falls.  Ways to make your home safe include removing tripping hazards and installing grab bars in the bathroom.  Ask for help when making these changes in your home. This information is not intended to replace advice given to you by your health care provider. Make sure you discuss any questions you  have with your health care  provider. Document Released: 04/13/2009 Document Revised: 01/30/2017 Document Reviewed: 01/30/2017 Elsevier Interactive Patient Education  2019 [employer].

## 2018-07-29 NOTE — Progress Notes (Signed)
PATIENT: Wendy Krueger DOB: 21-Oct-1929  REASON FOR VISIT: follow up HISTORY FROM: patient and daughters [name] and [name])   HISTORY OF PRESENT ILLNESS: Today 07/29/18 Wendy Krueger presents today for follow up of dementia. She is doing well. She has a caregiver comes to help 6 days a week with ADL's. She needs assistance with dressing and bathing. She is living at home with her daughter [name]. She has fallen a couple of times since her last visit. She did break her wrist with one fall. No other injuries. She is using a walker. She does get a little more confused at night. She is not having any behavior concerns. She is still biting her nails. They have tried multiple mechanisms to deter biting but nothing has worked.    Family is aware of carotid aneurysm. At last visit they did not want to seek treatment and wanted to manage medically with blood pressure control. She has had some elevated readings in the past but has worked closely with her PCP to control. PCP increased her dose of valsartan about 2 weeks ago and BP seems to be a little better. It is 138/92 today.    REVIEW OF SYSTEMS: Out of a complete 14 system review of symptoms, the patient complains only of the following symptoms, memory loss, confusion, excessive sleep and all other reviewed systems are negative.  ALLERGIES: Allergies  Allergen Reactions  . Alendronate Sodium Other (See Comments)    Leg pain   . Amlodipine Besylate Hives  . Calcitonin ([place]) Other (See Comments)     headache/ head pressure  . Simvastatin Other (See Comments)    Leg pain     HOME MEDICATIONS: Outpatient Medications Prior to Visit  Medication Sig Dispense Refill  . Cholecalciferol (VITAMIN D-3) 1000 units CAPS Take 1 capsule See admin instructions by mouth. Take 1000 units in the morning and 2000 in the evening    . clopidogrel (PLAVIX) 75 MG tablet TAKE 1 TABLET BY MOUTH EVERY DAY 30 tablet 11  . donepezil (ARICEPT) 10 MG tablet TAKE  1 TABLET (10 MG TOTAL) BY MOUTH AT BEDTIME. 30 tablet 11  . Fexofenadine HCl (ALLERGY 24-HR PO) Take 1 capsule by mouth daily.    . hydrochlorothiazide (HYDRODIURIL) 25 MG tablet Take 1 tablet (25 mg total) by mouth daily. 90 tablet 3  . hydrocortisone (ANUSOL-HC) 2.5 % rectal cream Place rectally 3 (three) times daily. 30 g 0  . memantine (NAMENDA) 10 MG tablet TAKE 1 TABLET (10 MG TOTAL) BY MOUTH 2 (TWO) TIMES DAILY. 60 tablet 11  . metoprolol tartrate (LOPRESSOR) 25 MG tablet Take 1 tablet (25 mg total) by mouth 2 (two) times daily. 180 tablet 3  . Multiple Vitamins-Minerals ([facility] SPECTRAVITE PO) Take 1 capsule by mouth daily.    . potassium chloride (KLOR-CON M10) 10 MEQ tablet Take 2 tablets (20 mEq total) by mouth daily. 180 tablet 3  . valsartan (DIOVAN) 160 MG tablet Take 1 tablet (160 mg total) by mouth 2 (two) times daily. 180 tablet 0  . vitamin B-12 (CYANOCOBALAMIN) 1000 MCG tablet Take 1,000 mcg by mouth daily.     No facility-administered medications prior to visit.     PAST MEDICAL HISTORY: Past Medical History:  Diagnosis Date  . Asymptomatic gallstones   . BRBPR (bright red blood per rectum)   . Cataract 04/13/2018   bilateral eyes  . Cholelithiasis   . Colitis - presumed infectious origin    one ER visit   .  Dementia ([place])   . Duodenitis   . Fall   . Gastritis   . GI bleed   . Hiatal hernia   . Hyperlipidemia   . Hypertension   . Osteoarthritis   . Osteoporosis   . Stroke ([place])   . Syncope     PAST SURGICAL HISTORY: Past Surgical History:  Procedure Laterality Date  . ABDOMINAL HYSTERECTOMY     BSO- fibroids  . ABI's     normal  . dexa  1/05   osteoporosis  . ESOPHAGOGASTRODUODENOSCOPY N/A 03/21/2014   Procedure: ESOPHAGOGASTRODUODENOSCOPY (EGD);  Surgeon: Milus Banister, MD;  Location: [facility];  Service: Endoscopy;  Laterality: N/A;  . left foot brace    . WRIST FRACTURE SURGERY  2/01   R arm  . WRIST FRACTURE SURGERY Left 02/12/2018     FAMILY HISTORY: Family History  Problem Relation Age of Onset  . Heart attack Father   . Hypertension Father   . Heart attack Mother   . Throat cancer Brother   . Breast cancer Daughter     SOCIAL HISTORY: Social History   Socioeconomic History  . Marital status: Widowed    Spouse name: Not on file  . Number of children: 5  . Years of education: 30  . Highest education level: Not on file  Occupational History  . Occupation: retired    Fish farm manager: RETIRED  Social Needs  . Financial resource strain: Not on file  . Food insecurity:    Worry: Not on file    Inability: Not on file  . Transportation needs:    Medical: Not on file    Non-medical: Not on file  Tobacco Use  . Smoking status: Never Smoker  . Smokeless tobacco: Never Used  Substance and Sexual Activity  . Alcohol use: No    Alcohol/week: 0.0 standard drinks  . Drug use: No  . Sexual activity: Never  Lifestyle  . Physical activity:    Days per week: Not on file    Minutes per session: Not on file  . Stress: Not on file  Relationships  . Social connections:    Talks on phone: Not on file    Gets together: Not on file    Attends religious service: Not on file    Active member of club or organization: Not on file    Attends meetings of clubs or organizations: Not on file    Relationship status: Not on file  . Intimate partner violence:    Fear of current or ex partner: Not on file    Emotionally abused: Not on file    Physically abused: Not on file    Forced sexual activity: Not on file  Other Topics Concern  . Not on file  Social History Narrative   Has 4 brothers, 1 sister   Lives with 2 adult grandchildren and her daughter   Has 4 daughters, 1 son   Right handed   Rare caffeine       PHYSICAL EXAM  Vitals:   07/29/18 1057  BP: (!) 138/92  Pulse: 76  Weight: 169 lb (76.7 kg)  Height: 5' (1.524 m)   Body mass index is 33.01 kg/m.  Generalized: Well developed, in no acute distress    Neurological examination  Mentation: Not oriented to time, place or situation. Oriented to self. Alert. Follows intermittent commands speech and language fluent Cranial nerve II-XII: Pupils were equal round reactive to light. Extraocular movements were full, visual field were full  on confrontational test. Facial sensation and strength were normal. Limited by patient's ability to follow commands.  Motor: The motor testing reveals 4 over 4 strength of upper extremities and 3/5 in lower extremities. Good symmetric motor tone is noted throughout.  Sensory: Sensory testing is intact to soft touch on all 4 extremities. No evidence of extinction is noted.  Coordination: unable to test due to patient's inability to follow directions.  Gait and station: In wheelchair today     DIAGNOSTIC DATA (LABS, IMAGING, TESTING) - I reviewed patient records, labs, notes, testing and imaging myself where available.  Lab Results  Component Value Date   WBC 4.1 07/14/2018   HGB 11.7 (L) 07/14/2018   HCT 35.7 (L) 07/14/2018   MCV 86.4 07/14/2018   PLT 208.0 07/14/2018      Component Value Date/Time   NA 138 07/14/2018 1624   K 4.2 07/14/2018 1624   CL 102 07/14/2018 1624   CO2 28 07/14/2018 1624   GLUCOSE 82 07/14/2018 1624   BUN 15 07/14/2018 1624   CREATININE 1.15 07/14/2018 1624   CALCIUM 10.1 07/14/2018 1624   PROT 6.9 07/14/2018 1624   ALBUMIN 4.0 07/14/2018 1624   AST 19 07/14/2018 1624   ALT 12 07/14/2018 1624   ALKPHOS 60 07/14/2018 1624   BILITOT 0.3 07/14/2018 1624   GFRNONAA 48 (L) 02/11/2018 0426   GFRAA 55 (L) 02/11/2018 0426   Lab Results  Component Value Date   CHOL 287 (H) 04/13/2018   HDL 59.50 04/13/2018   LDLCALC 190 (H) 04/13/2018   LDLDIRECT 215.0 04/13/2018   TRIG 187.0 (H) 04/13/2018   CHOLHDL 5 04/13/2018   Lab Results  Component Value Date   HGBA1C 5.4 03/07/2016   Lab Results  Component Value Date   VITAMINB12 1,228 (H) 03/21/2014   Lab Results  Component  Value Date   TSH 2.34 07/14/2018      ASSESSMENT AND PLAN 85 y.o. year old female  has a past medical history of Asymptomatic gallstones, BRBPR (bright red blood per rectum), Cataract (04/13/2018), Cholelithiasis, Colitis - presumed infectious origin, Dementia ([place]), Duodenitis, Fall, Gastritis, GI bleed, Hiatal hernia, Hyperlipidemia, Hypertension, Osteoarthritis, Osteoporosis, Stroke ([place]), and Syncope. here with   1. Dementia   2. Carotid aneurysm   Patient is doing well. She has had a couple of falls since last visit, one of which resulting in injury or wrist. Fall precautions discussed and info provided in AVS. Will continue Aricept and Namenda daily as prescribed. Continue close monitoring at home with 24 hour supervision. Safety concerns addressed. Family is not interested in invasive procedures to repair aneurysm. Will continue to follow up with PCP to monitor and treat BP with goal less than 130/90. Advised to seek assistance with ophthalmology for scratched glasses.   Ward Givens, MSN, NP-C 07/29/2018, 11:13 AM [facility] [address], [place], [place] [address] [phone]

## 2018-07-31 NOTE — Progress Notes (Signed)
made any corrections needed, and agree with history, physical, neuro exam,assessment and plan as stated.     Antonia Ahern, MD [facility]   

## 2018-08-20 ENCOUNTER — Telehealth: Payer: Self-pay | Admitting: *Deleted

## 2018-08-20 MED ORDER — LOSARTAN POTASSIUM 100 MG PO TABS: 100.0000 mg | ORAL_TABLET | Freq: Every day | ORAL | 3 refills | Status: DC

## 2018-08-20 NOTE — Telephone Encounter (Signed)
Received fax from pharmacy saying that valsartan is on national back order until the end of April. Called pharmacy and they said they do have losartan and olmesartan in stock  [facility]

## 2018-08-20 NOTE — Telephone Encounter (Signed)
I changed it to losartan  Please let them know

## 2018-08-21 NOTE — Telephone Encounter (Signed)
Left VM letting pharmacy know Rx was changed and requested them to make sure pt is aware of med change

## 2018-08-24 ENCOUNTER — Telehealth: Payer: Self-pay

## 2018-08-24 NOTE — Telephone Encounter (Signed)
Brenda advised

## 2018-08-24 NOTE — Telephone Encounter (Signed)
[name] called; pts other daughter called [name] and BP this afternoon with home BP cuff is 95/59 P 60 [name] is not sure if home BP  Cuff is accurate.  Pt is feeling tired and sleepy. [name] said valsartan which is on hx med list was increased at 07/20/18 visit to valsartan 160 mg bid. Should pt take second dose of valsartan today. Pt has appt to see Dr Einar Pheasant 08/25/18 at 9:40. If pt condition changes or worsens prior to appt [name] will take pt to ED if needed. FYI to Dr Glori Bickers.

## 2018-08-24 NOTE — Telephone Encounter (Signed)
Do not take 2nd dose tonight -thanks for letting me know  F/u tomorrow as planned

## 2018-08-25 ENCOUNTER — Encounter: Payer: Self-pay | Admitting: Family Medicine

## 2018-08-25 ENCOUNTER — Ambulatory Visit (INDEPENDENT_AMBULATORY_CARE_PROVIDER_SITE_OTHER): Payer: Medicare Other | Admitting: Family Medicine

## 2018-08-25 VITALS — BP 144/80 | HR 60 | Temp 97.5°F | Resp 14 | Ht 60.0 in | Wt 170.5 lb

## 2018-08-25 DIAGNOSIS — I10 Essential (primary) hypertension: Secondary | ICD-10-CM | POA: Diagnosis not present

## 2018-08-25 DIAGNOSIS — F0150 Vascular dementia without behavioral disturbance: Secondary | ICD-10-CM

## 2018-08-25 LAB — BASIC METABOLIC PANEL
BUN: 19 mg/dL (ref 6–23)
CO2: 26 mEq/L (ref 19–32)
Calcium: 9.7 mg/dL (ref 8.4–10.5)
Chloride: 101 mEq/L (ref 96–112)
Creatinine, Ser: 1.21 mg/dL — ABNORMAL HIGH (ref 0.40–1.20)
GFR: 42.11 mL/min — ABNORMAL LOW (ref >60.00)
Glucose, Bld: 81 mg/dL (ref 70–99)
Potassium: 3.4 mEq/L — ABNORMAL LOW (ref 3.5–5.1)
Sodium: 136 mEq/L (ref 135–145)

## 2018-08-25 NOTE — Assessment & Plan Note (Signed)
Discussed that sleeping during the day can be normal and somewhat reassuring that pt continues to sleep OK at night. Delirium precautions provided - family will make sure caretaker on Saturday is working to engage pt in more activity to decrease daytime sleeping

## 2018-08-25 NOTE — Assessment & Plan Note (Signed)
BP fluctuating at home. Medication change to losartan due to shortage of valsartan. Given lower BP agree with reducing dose to 100 mg Losartan. Advised daily BP monitoring and close PCP follow-up. Lower suspicion for sepsis or infectious cause given well appearing and no other concerning symptoms. Will check kidney function today given intermittent bp. Previous was Cr 1.15

## 2018-08-25 NOTE — Progress Notes (Signed)
Subjective:     Wendy Krueger is a 85 y.o. female presenting for Fatigue (b/p has been up and down. Feeling fatigue, sleepy. Does have chronic runny nose and cough. No cold cymptoms. No fever.)     HPI   #Fatigued - blood pressure has been fluctuating a lot  - sleepy - more so during the day than normal - also sleeps at night well - has not changed blood pressure medication - no cold symptoms - does have runny nose/cough - Stays at home - has a care taker and her children take shifts caring for her - one sister called yesterday with BP of 95 - typically 120/70 - occasionally some high blood puressre 160/88 - normal to doze in the afternoons - but seems to be a little more than normal     Review of Systems  Constitutional: Negative for chills and fever.  HENT: Positive for rhinorrhea.   Respiratory: Positive for cough. Negative for shortness of breath.   Cardiovascular: Negative for chest pain and palpitations.  Gastrointestinal: Negative for diarrhea, nausea and vomiting.  Genitourinary: Negative for dysuria and frequency.  Neurological: Negative for dizziness and light-headedness.   07/20/2018: Elevated BP - increased valsartan 08/24/2018: Telephone - low BP at home. Told to hold 2nd dose   Social History   Tobacco Use  Smoking Status Never Smoker  Smokeless Tobacco Never Used        Objective:    BP Readings from Last 3 Encounters:  08/25/18 (!) 144/80  07/29/18 (!) 138/92  07/20/18 (!) 180/82   Wt Readings from Last 3 Encounters:  08/25/18 170 lb 8 oz (77.3 kg)  07/29/18 169 lb (76.7 kg)  07/20/18 169 lb (76.7 kg)    BP (!) 144/80   Pulse 60   Temp (!) 97.5 F (36.4 C)   Resp 14   Ht 5' (1.524 m)   Wt 170 lb 8 oz (77.3 kg)   SpO2 98%   BMI 33.30 kg/m    Physical Exam Constitutional:      General: She is not in acute distress.    Appearance: She is well-developed. She is not diaphoretic.     Comments: Well appearing, in wheelchair    HENT:     Right Ear: External ear normal.     Left Ear: External ear normal.     Nose: Nose normal.  Eyes:     Conjunctiva/sclera: Conjunctivae normal.  Neck:     Musculoskeletal: Neck supple.  Cardiovascular:     Rate and Rhythm: Normal rate and regular rhythm.     Heart sounds: Murmur present.  Pulmonary:     Effort: Pulmonary effort is normal. No respiratory distress.     Breath sounds: No wheezing.  Musculoskeletal:     Right lower leg: No edema.     Left lower leg: No edema.  Skin:    General: Skin is warm and dry.     Capillary Refill: Capillary refill takes less than 2 seconds.  Neurological:     Mental Status: She is alert. Mental status is at baseline.  Psychiatric:        Mood and Affect: Mood normal.        Behavior: Behavior normal.           Assessment & Plan:   Problem List Items Addressed This Visit      Cardiovascular and Mediastinum   Essential hypertension - Primary    BP fluctuating at home. Medication change to  losartan due to shortage of valsartan. Given lower BP agree with reducing dose to 100 mg Losartan. Advised daily BP monitoring and close PCP follow-up. Lower suspicion for sepsis or infectious cause given well appearing and no other concerning symptoms. Will check kidney function today given intermittent bp. Previous was Cr 1.15      Relevant Orders   Basic metabolic panel     Nervous and Auditory   Dementia (HCC)    Discussed that sleeping during the day can be normal and somewhat reassuring that pt continues to sleep OK at night. Delirium precautions provided - family will make sure caretaker on Saturday is working to engage pt in more activity to decrease daytime sleeping          Return in about 2 weeks (around 09/08/2018).  Lesleigh Noe, MD

## 2018-08-25 NOTE — Patient Instructions (Addendum)
Stop taking the Valsartan 160 mg Twice daily  Start taking the Losartan 100 mg once daily   To check your blood pressure 1) Sit in a quiet and relaxed place for 5 minutes 2) Make sure your feet are flat on the ground 3) Consider checking first thing in the morning   Normal blood pressure is less than 140/90 Ideally you blood pressure should be around 120/80   How is this treated? Treatment of delirium depends on the cause and severity. Delirium usually goes away within days or weeks of treating the underlying cause. In the meantime, the person should not be left alone because he or she may accidentally cause self-harm. Treatment includes supportive care, such as:  Increased light during the day and decreased light at night.  Low noise level.  Uninterrupted sleep.  A regular daily schedule.  Clocks and calendars to help with orientation.  Familiar objects, including the person's pictures and clothing.  Frequent visits from familiar family and friends.  Healthy diet.  Exercise. In more severe cases of delirium, medicine may be prescribed to help the person to keep calm and think more clearly.

## 2018-08-26 ENCOUNTER — Telehealth: Payer: Self-pay

## 2018-08-26 NOTE — Telephone Encounter (Signed)
Left message for [name], patient's daughter, to call back about results

## 2018-08-26 NOTE — Telephone Encounter (Signed)
[name] returned Wendy Krueger's call.  She can be reached at [phone].

## 2018-08-26 NOTE — Telephone Encounter (Signed)
Brenda advised

## 2018-09-08 ENCOUNTER — Ambulatory Visit (INDEPENDENT_AMBULATORY_CARE_PROVIDER_SITE_OTHER): Payer: Medicare Other | Admitting: Family Medicine

## 2018-09-08 ENCOUNTER — Encounter: Payer: Self-pay | Admitting: Family Medicine

## 2018-09-08 VITALS — BP 110/64 | HR 73 | Temp 98.1°F | Ht 60.0 in | Wt 166.1 lb

## 2018-09-08 DIAGNOSIS — I10 Essential (primary) hypertension: Secondary | ICD-10-CM

## 2018-09-08 DIAGNOSIS — F0390 Unspecified dementia without behavioral disturbance: Secondary | ICD-10-CM | POA: Diagnosis not present

## 2018-09-08 NOTE — Patient Instructions (Addendum)
I think it is ok to check blood pressure less often (1-2 times per week)  Blood pressure looks good  The range at home is good   Keeping on a schedule is a good idea  Let her nap during the day as well   Toe nails have the appearance of trauma more than anything else  Keep nails trimmed Watch for mal fitting shoes

## 2018-09-08 NOTE — Assessment & Plan Note (Signed)
bp in fair control at this time  BP Readings from Last 1 Encounters:  09/08/18 110/64   No changes needed Most recent labs reviewed  Disc lifstyle change with low sodium diet and exercise  Well controlled with losartan 100 mg daily  Rev home readings - not as variable  No symptoms of hypotension  Will continue to follow

## 2018-09-08 NOTE — Assessment & Plan Note (Signed)
Continues to worsen  Repeats herself  Sleeping more  Great care from her family

## 2018-09-08 NOTE — Progress Notes (Signed)
Subjective:    Patient ID: Wendy Krueger, female    DOB: [birth year], 85 y.o.   MRN: [id number]  HPI  Here for f/u of HTN  Wt Readings from Last 3 Encounters:  09/08/18 166 lb 1 oz (75.3 kg)  08/25/18 170 lb 8 oz (77.3 kg)  07/29/18 169 lb (76.7 kg)   32.43 kg/m   Saw Dr Einar Pheasant on 2/25 Essential hypertension - Primary     BP fluctuating at home. Medication change to losartan due to shortage of valsartan. Given lower BP agree with reducing dose to 100 mg Losartan. Advised daily BP monitoring and close PCP follow-up. Lower suspicion for sepsis or infectious cause given well appearing and no other concerning symptoms. Will check kidney function today given intermittent bp. Previous was Cr 1.15      Relevant Orders   Basic metabolic panel Results for orders placed or performed in visit on 25/05/39  Basic metabolic panel  Result Value Ref Range   Sodium 136 135 - 145 mEq/L   Potassium 3.4 (L) 3.5 - 5.1 mEq/L   Chloride 101 96 - 112 mEq/L   CO2 26 19 - 32 mEq/L   Glucose, Bld 81 70 - 99 mg/dL   BUN 19 6 - 23 mg/dL   Creatinine, Ser 1.21 (H) 0.40 - 1.20 mg/dL   Calcium 9.7 8.4 - 10.5 mg/dL   GFR 42.11 (L) >60.00 mL/min     bp is stable today  No cp or palpitations or headaches or edema  No side effects to medicines  BP Readings from Last 3 Encounters:  09/08/18 110/64  08/25/18 (!) 144/80  07/29/18 (!) 138/92     Pulse Readings from Last 3 Encounters:  09/08/18 73  08/25/18 60  07/29/18 76   Takes hctz 25 mg daily Losartan 100 mg daily  Metoprolol 25 mg bid klor con 10 meq daily   From low 130s to 98/ 70s-50s   Does not complain of dizziness  Overall better control   Is still sleeping a fair amount day and night  Getting her up on a schedule is helping a bit   Had question re: toe nails  White color   Patient Active Problem List   Diagnosis Date Noted  . Transient alteration of awareness 07/14/2018  . Cough 05/22/2018  . Contusion of right knee  05/22/2018  . Chronic diarrhea 02/17/2018  . Distal radius fracture, left 02/17/2018  . Hypokalemia 02/09/2018  . History of lower GI bleeding 02/09/2018  . Cerebellar stroke ([place]) 03/10/2016  . Carotid aneurysm, right ([place]) 02/21/2016  . Lipoma of shoulder 03/27/2015  . History of closed Colles' fracture 09/06/2014  . Allergic rhinitis 06/20/2014  . Diastolic dysfunction 76/73/4193  . History of GI bleed 03/21/2014  . Dementia ([place]) 09/01/2013  . Memory loss 09/01/2013  . Anxiety 09/01/2013  . Encounter for Medicare annual wellness exam 01/08/2013  . Gallstones 11/11/2011  . 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 Risk for falls 06/04/2011  . Constipation 03/05/2011  . Vitamin D deficiency 03/28/2009  . HYPERCHOLESTEROLEMIA, PURE 03/17/2007  . Essential hypertension 03/16/2007  . OSTEOARTHRITIS 03/16/2007  . Osteoporosis 03/16/2007   Past Medical History:  Diagnosis Date  . Asymptomatic gallstones   . BRBPR (bright red blood per rectum)   . Cataract 04/13/2018   bilateral eyes  . Cholelithiasis   . Colitis - presumed infectious origin    one ER visit   . Dementia ([place])   . Duodenitis   .  Fall   . Gastritis   . GI bleed   . Hiatal hernia   . Hyperlipidemia   . Hypertension   . Osteoarthritis   . Osteoporosis   . Stroke ([place])   . Syncope    Past Surgical History:  Procedure Laterality Date  . ABDOMINAL HYSTERECTOMY     BSO- fibroids  . ABI's     normal  . dexa  1/05   osteoporosis  . ESOPHAGOGASTRODUODENOSCOPY N/A 03/21/2014   Procedure: ESOPHAGOGASTRODUODENOSCOPY (EGD);  Surgeon: Milus Banister, MD;  Location: [facility];  Service: Endoscopy;  Laterality: N/A;  . left foot brace    . WRIST FRACTURE SURGERY  2/01   R arm  . WRIST FRACTURE SURGERY Left 02/12/2018   Social History   Tobacco Use  . Smoking status: Never Smoker  . Smokeless tobacco: Never Used  Substance Use Topics  . Alcohol use: No    Alcohol/week: 0.0 standard drinks  .  Drug use: No   Family History  Problem Relation Age of Onset  . Heart attack Father   . Hypertension Father   . Heart attack Mother   . Throat cancer Brother   . Breast cancer Daughter    Allergies  Allergen Reactions  . Alendronate Sodium Other (See Comments)    Leg pain   . Amlodipine Besylate Hives  . Calcitonin ([place]) Other (See Comments)     headache/ head pressure  . Simvastatin Other (See Comments)    Leg pain    Current Outpatient Medications on File Prior to Visit  Medication Sig Dispense Refill  . Cholecalciferol (VITAMIN D-3) 1000 units CAPS Take 1 capsule See admin instructions by mouth. Take 1000 units in the morning and 2000 in the evening    . clopidogrel (PLAVIX) 75 MG tablet TAKE 1 TABLET BY MOUTH EVERY DAY 30 tablet 11  . donepezil (ARICEPT) 10 MG tablet TAKE 1 TABLET (10 MG TOTAL) BY MOUTH AT BEDTIME. 30 tablet 11  . Fexofenadine HCl (ALLERGY 24-HR PO) Take 1 capsule by mouth daily.    . hydrochlorothiazide (HYDRODIURIL) 25 MG tablet Take 1 tablet (25 mg total) by mouth daily. 90 tablet 3  . hydrocortisone (ANUSOL-HC) 2.5 % rectal cream Place rectally 3 (three) times daily. 30 g 0  . losartan (COZAAR) 100 MG tablet Take 1 tablet (100 mg total) by mouth daily. 90 tablet 3  . memantine (NAMENDA) 10 MG tablet TAKE 1 TABLET (10 MG TOTAL) BY MOUTH 2 (TWO) TIMES DAILY. 60 tablet 11  . metoprolol tartrate (LOPRESSOR) 25 MG tablet Take 1 tablet (25 mg total) by mouth 2 (two) times daily. 180 tablet 3  . Multiple Vitamins-Minerals ([employer] SPECTRAVITE PO) Take 1 capsule by mouth daily.    . potassium chloride (KLOR-CON M10) 10 MEQ tablet Take 2 tablets (20 mEq total) by mouth daily. 180 tablet 3  . vitamin B-12 (CYANOCOBALAMIN) 1000 MCG tablet Take 1,000 mcg by mouth daily.     No current facility-administered medications on file prior to visit.      Review of Systems  Constitutional: Negative for activity change, appetite change, fatigue, fever and unexpected weight  change.  HENT: Negative for congestion, ear pain, rhinorrhea, sinus pressure and sore throat.   Eyes: Negative for pain, redness and visual disturbance.  Respiratory: Negative for cough, shortness of breath and wheezing.   Cardiovascular: Negative for chest pain and palpitations.  Gastrointestinal: Negative for abdominal pain, blood in stool, constipation and diarrhea.  Endocrine: Negative for polydipsia and  polyuria.  Genitourinary: Negative for dysuria, frequency and urgency.  Musculoskeletal: Negative for arthralgias, back pain and myalgias.  Skin: Negative for pallor and rash.  Allergic/Immunologic: Negative for environmental allergies.  Neurological: Negative for dizziness, syncope and headaches.  Hematological: Negative for adenopathy. Does not bruise/bleed easily.  Psychiatric/Behavioral: Positive for confusion and decreased concentration. Negative for dysphoric mood. The patient is not nervous/anxious.        Dementia        Objective:   Physical Exam Constitutional:      General: She is not in acute distress.    Appearance: She is well-developed.  HENT:     Head: Normocephalic and atraumatic.  Eyes:     Conjunctiva/sclera: Conjunctivae normal.     Pupils: Pupils are equal, round, and reactive to light.  Neck:     Musculoskeletal: Normal range of motion and neck supple.     Thyroid: No thyromegaly.     Vascular: No carotid bruit or JVD.  Cardiovascular:     Rate and Rhythm: Normal rate and regular rhythm.     Heart sounds: Normal heart sounds. No gallop.   Pulmonary:     Effort: Pulmonary effort is normal. No respiratory distress.     Breath sounds: Normal breath sounds. No wheezing or rales.  Abdominal:     General: There is no distension or abdominal bruit.  Lymphadenopathy:     Cervical: No cervical adenopathy.  Skin:    General: Skin is warm and dry.     Findings: No rash.     Comments: Some of her toe nails are superficially bright white -as if they had been  scraped or painted  slt thickening only of R 2nd toe nail   Neurological:     Mental Status: She is alert. Mental status is at baseline.     Cranial Nerves: No cranial nerve deficit.     Deep Tendon Reflexes: Reflexes are normal and symmetric.  Psychiatric:        Mood and Affect: Mood normal.        Cognition and Memory: Cognition is impaired. She exhibits impaired recent memory.     Comments: Baseline dementia  Talks and repeats similar phrases through visit  Smiling/cheerful             Assessment & Plan:   Problem List Items Addressed This Visit      Cardiovascular and Mediastinum   Essential hypertension - Primary    bp in fair control at this time  BP Readings from Last 1 Encounters:  09/08/18 110/64   No changes needed Most recent labs reviewed  Disc lifstyle change with low sodium diet and exercise  Well controlled with losartan 100 mg daily  Rev home readings - not as variable  No symptoms of hypotension  Will continue to follow         Other   Memory loss    Continues to worsen  Repeats herself  Sleeping more  Great care from her family

## 2018-09-21 ENCOUNTER — Telehealth: Payer: Self-pay

## 2018-09-21 NOTE — Telephone Encounter (Signed)
Daughter notified of Dr. Tower's comments and instructions and verbalized understanding. 

## 2018-09-21 NOTE — Telephone Encounter (Signed)
Wendy Krueger(DPR signed) said this morning around 5AM pt had a constipated BM with bleeding from external hemorrhoid. pts daughter said each time pts heart would beat blood would spurt from hemorrhoid. Per previous instruction from ED pts daughter applied sugar and that stopped the bleeding. Pt has dementia and Wendy Krueger does not think pt has abd pain, no fever.pt last seen 09/08/18; Wendy Krueger said doctor does not want to do surgery due to pts age and condition. Wendy Krueger wants to know is there anything else needs to be done. Wendy Krueger has started giving pt a softer diet to help with constipation. Wendy Krueger request cgb.

## 2018-09-21 NOTE — Telephone Encounter (Signed)
Try to minimize constipation any way they can (stool softener/miralax)  I assume they have tried meds like prep H over the counter (may help once bleeding has stopped for several days) Keep [place] posted

## 2018-12-30 ENCOUNTER — Telehealth: Payer: Self-pay

## 2018-12-30 MED ORDER — HYDROCORTISONE (PERIANAL) 2.5 % EX CREA: 1.0000 "application " | TOPICAL_CREAM | Freq: Two times a day (BID) | CUTANEOUS | 0 refills | Status: DC | PRN

## 2018-12-30 NOTE — Telephone Encounter (Signed)
Wendy Krueger (DPR signed) left v/m that pt having bleeding hemorrhoid and requested refill proctosol-HCL 2.5%. I do not see that med on current or hx med list but Hydrocortisone 2.5% rectal cream by Dr Myrene Buddy is on current med list given 30g on 02/12/18. Pt last seen 09/08/18.Please advise.

## 2018-12-31 ENCOUNTER — Encounter: Payer: Self-pay | Admitting: Family Medicine

## 2018-12-31 ENCOUNTER — Ambulatory Visit (INDEPENDENT_AMBULATORY_CARE_PROVIDER_SITE_OTHER): Payer: Medicare Other | Admitting: Family Medicine

## 2018-12-31 ENCOUNTER — Other Ambulatory Visit: Payer: Self-pay

## 2018-12-31 VITALS — BP 104/70 | HR 63 | Temp 97.7°F | Ht 60.0 in | Wt 163.4 lb

## 2018-12-31 DIAGNOSIS — I6381 Other cerebral infarction due to occlusion or stenosis of small artery: Secondary | ICD-10-CM

## 2018-12-31 DIAGNOSIS — K625 Hemorrhage of anus and rectum: Secondary | ICD-10-CM

## 2018-12-31 DIAGNOSIS — Z8719 Personal history of other diseases of the digestive system: Secondary | ICD-10-CM | POA: Diagnosis not present

## 2018-12-31 MED ORDER — CLOPIDOGREL BISULFATE 75 MG PO TABS: ORAL_TABLET | ORAL | Status: DC

## 2018-12-31 NOTE — Progress Notes (Signed)
Baseline dementia, with family helping with hx here at the office visit and care at home.  1 week ago she has normal BM then diarrhea, then some blood per rectum.  BRBPR intermittently.  Variable amounts with BMs now.  No black stools.  She has known hemorrhoids.  Has been using topical hydrocortisone, just got a refill.  She isn't complaining of pain except with cleaning up after BMs.  No pain with BMs.   No other new complaints.  Still on Plavix.  Meds, vitals, and allergies reviewed.   ROS: Per HPI unless specifically indicated in ROS section   nad ncat She repeats herself at baseline due to dementia.  She is pleasant in conversation rrr ctab Abdomen soft, not tender.  Normal bowel sounds Rectal exam deferred.

## 2018-12-31 NOTE — Patient Instructions (Addendum)
Hold plavix for now.  Go to the lab on the way out.  We'll contact you with your lab report. Use the hydrocortisone and nonstick bandages if needed.  Update Dr. Glori Bickers next week.   Take care.  Glad to see you.

## 2019-01-01 LAB — CBC WITH DIFFERENTIAL/PLATELET
Absolute Monocytes: 524 cells/uL (ref 200–950)
Basophils Absolute: 49 cells/uL (ref 0–200)
Basophils Relative: 0.9 %
Eosinophils Absolute: 162 cells/uL (ref 15–500)
Eosinophils Relative: 3 %
HCT: 38.3 % (ref 35.0–45.0)
Hemoglobin: 13.2 g/dL (ref 11.7–15.5)
Lymphs Abs: 1571 cells/uL (ref 850–3900)
MCH: 31 pg (ref 27.0–33.0)
MCHC: 34.5 g/dL (ref 32.0–36.0)
MCV: 89.9 fL (ref 80.0–100.0)
MPV: 10.6 fL (ref 7.5–12.5)
Monocytes Relative: 9.7 %
Neutro Abs: 3094 cells/uL (ref 1500–7800)
Neutrophils Relative %: 57.3 %
Platelets: 199 10*3/uL (ref 140–400)
RBC: 4.26 10*6/uL (ref 3.80–5.10)
RDW: 13.5 % (ref 11.0–15.0)
Total Lymphocyte: 29.1 %
WBC: 5.4 10*3/uL (ref 3.8–10.8)

## 2019-01-02 NOTE — Assessment & Plan Note (Signed)
She has known hemorrhoids.  She is on Plavix.  The combination likely contributes to current symptoms.  Reasonable to check CBC given the intermittent bleeding.  She has a benign abdominal exam and is not in any distress.  It does not appear that given her overall situation she would need more extensive treatment right now.  Hold plavix for now.  Check CBC.  See notes on labs. Use topical hydrocortisone and nonstick bandages if needed.  I asked her family to update Dr. Glori Bickers next week.    If she continues to bleed off Plavix then she may need further evaluation/intervention. I will defer to PCP about the potential restart of Plavix in the future.  Note routed to PCP as FYI.

## 2019-01-18 ENCOUNTER — Emergency Department (HOSPITAL_COMMUNITY)
Admission: EM | Admit: 2019-01-18 | Discharge: 2019-01-18 | Disposition: A | Discharge status: Home / Self Care | Payer: Medicare Other | Attending: Emergency Medicine | Admitting: Emergency Medicine

## 2019-01-18 ENCOUNTER — Other Ambulatory Visit: Payer: Self-pay

## 2019-01-18 ENCOUNTER — Emergency Department (HOSPITAL_COMMUNITY): Payer: Medicare Other

## 2019-01-18 ENCOUNTER — Encounter (HOSPITAL_COMMUNITY): Payer: Self-pay | Admitting: Pharmacy Technician

## 2019-01-18 DIAGNOSIS — W01190A Fall on same level from slipping, tripping and stumbling with subsequent striking against furniture, initial encounter: Secondary | ICD-10-CM | POA: Insufficient documentation

## 2019-01-18 DIAGNOSIS — Z79899 Other long term (current) drug therapy: Secondary | ICD-10-CM | POA: Diagnosis not present

## 2019-01-18 DIAGNOSIS — M7918 Myalgia, other site: Secondary | ICD-10-CM | POA: Insufficient documentation

## 2019-01-18 DIAGNOSIS — I10 Essential (primary) hypertension: Secondary | ICD-10-CM | POA: Diagnosis not present

## 2019-01-18 DIAGNOSIS — W19XXXA Unspecified fall, initial encounter: Secondary | ICD-10-CM | POA: Diagnosis not present

## 2019-01-18 DIAGNOSIS — Y9301 Activity, walking, marching and hiking: Secondary | ICD-10-CM | POA: Diagnosis not present

## 2019-01-18 DIAGNOSIS — F0390 Unspecified dementia without behavioral disturbance: Secondary | ICD-10-CM | POA: Diagnosis not present

## 2019-01-18 DIAGNOSIS — Z8673 Personal history of transient ischemic attack (TIA), and cerebral infarction without residual deficits: Secondary | ICD-10-CM | POA: Insufficient documentation

## 2019-01-18 DIAGNOSIS — R5381 Other malaise: Secondary | ICD-10-CM | POA: Diagnosis not present

## 2019-01-18 DIAGNOSIS — Z043 Encounter for examination and observation following other accident: Secondary | ICD-10-CM | POA: Diagnosis not present

## 2019-01-18 DIAGNOSIS — Y999 Unspecified external cause status: Secondary | ICD-10-CM | POA: Diagnosis not present

## 2019-01-18 DIAGNOSIS — S0990XA Unspecified injury of head, initial encounter: Secondary | ICD-10-CM | POA: Diagnosis not present

## 2019-01-18 DIAGNOSIS — T1490XA Injury, unspecified, initial encounter: Secondary | ICD-10-CM | POA: Diagnosis not present

## 2019-01-18 DIAGNOSIS — S80811A Abrasion, right lower leg, initial encounter: Secondary | ICD-10-CM | POA: Diagnosis not present

## 2019-01-18 DIAGNOSIS — S199XXA Unspecified injury of neck, initial encounter: Secondary | ICD-10-CM | POA: Diagnosis not present

## 2019-01-18 DIAGNOSIS — Y92001 Dining room of unspecified non-institutional (private) residence as the place of occurrence of the external cause: Secondary | ICD-10-CM | POA: Insufficient documentation

## 2019-01-18 LAB — BASIC METABOLIC PANEL
Anion gap: 11 (ref 5–15)
BUN: 13 mg/dL (ref 8–23)
CO2: 27 mmol/L (ref 22–32)
Calcium: 10.3 mg/dL (ref 8.9–10.3)
Chloride: 98 mmol/L (ref 98–111)
Creatinine, Ser: 1.25 mg/dL — ABNORMAL HIGH (ref 0.44–1.00)
GFR calc Af Amer: 45 mL/min — ABNORMAL LOW (ref >60)
GFR calc non Af Amer: 39 mL/min — ABNORMAL LOW (ref >60)
Glucose, Bld: 109 mg/dL — ABNORMAL HIGH (ref 70–99)
Potassium: 3.8 mmol/L (ref 3.5–5.1)
Sodium: 136 mmol/L (ref 135–145)

## 2019-01-18 LAB — CBC
HCT: 42.9 % (ref 36.0–46.0)
Hemoglobin: 13.5 g/dL (ref 12.0–15.0)
MCH: 30.5 pg (ref 26.0–34.0)
MCHC: 31.5 g/dL (ref 30.0–36.0)
MCV: 97.1 fL (ref 80.0–100.0)
Platelets: 164 10*3/uL (ref 150–400)
RBC: 4.42 MIL/uL (ref 3.87–5.11)
RDW: 13.2 % (ref 11.5–15.5)
WBC: 8.2 10*3/uL (ref 4.0–10.5)
nRBC: 0 % (ref 0.0–0.2)

## 2019-01-18 NOTE — Discharge Instructions (Addendum)
Your workup in the ED after your fall was normal. This does not mean that there isn't something underlying that caused you to fall. Therefore, it is important that you follow-up with your primary care provider to discuss your fall at home.  Please return to the Emergency Room if you begin having headache, weakness on one side, lose consciousness, or have a change in your baseline mental status.

## 2019-01-18 NOTE — ED Provider Notes (Signed)
[facility] Provider Note   CSN: [id number] Arrival date & time: 01/18/19  1203    History   Chief Complaint Chief Complaint  Patient presents with   Fall    HPI [facility] is a 85 y.o. female who came to the ED via EMS after a fall witnessed by family. Pt has severe dementia at baseline and could not communicate where she was or what had happened. Pt's family (daughter [name]) was called to provide the remaining history. Daughter states that pt was moving from the dining room to the living room using her walker when she suddenly fell. She landed on her R knee flexed with her L leg outstretched. On the way down she hit her chin on a TV stand nearby. Pt then complained of "hurting all over" and vomited once. Daughter stated that her mother has been in her usual state of health and denied any lightheadedness or dizziness before the fall. Of note, pt was recently told (on 12/31/18) to stop her Plavix due to a bleeding hemorrhoid. Daughter states the bleeding stopped about 1 week ago, so she was instructed to restart the Plavix today (01/18/19) and her mother did receive a dose of Plavix this morning.      Fall Pertinent negatives include no chest pain, no abdominal pain and no shortness of breath.    Past Medical History:  Diagnosis Date   Asymptomatic gallstones    BRBPR (bright red blood per rectum)    Cataract 04/13/2018   bilateral eyes   Cholelithiasis    Colitis - presumed infectious origin    one ER visit    Dementia ([place])    Duodenitis    Fall    Gastritis    GI bleed    Hiatal hernia    Hyperlipidemia    Hypertension    Osteoarthritis    Osteoporosis    Stroke [facility])    Syncope     Patient Active Problem List   Diagnosis Date Noted   Transient alteration of awareness 07/14/2018   Cough 05/22/2018   Contusion of right knee 05/22/2018   Chronic diarrhea 02/17/2018   Distal radius fracture, left  02/17/2018   Hypokalemia 02/09/2018   History of lower GI bleeding 02/09/2018   Cerebellar stroke ([place]) 03/10/2016   Carotid aneurysm, right ([place]) 02/21/2016   Lipoma of shoulder 03/27/2015   History of closed Colles' fracture 09/06/2014   Allergic rhinitis 40/97/3532   Diastolic dysfunction 99/24/2683   History of GI bleed 03/21/2014   BRBPR (bright red blood per rectum) 03/19/2014   Dementia ([place]) 09/01/2013   Dementia, senile ([place]) 09/01/2013   Anxiety 09/01/2013   Encounter for Medicare annual wellness exam 01/08/2013   Gallstones 11/11/2011   Arterial ischemic stroke, 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   Risk for falls 06/04/2011   Constipation 03/05/2011   Vitamin D deficiency 03/28/2009   HYPERCHOLESTEROLEMIA, PURE 03/17/2007   Essential hypertension 03/16/2007   OSTEOARTHRITIS 03/16/2007   Osteoporosis 03/16/2007    Past Surgical History:  Procedure Laterality Date   ABDOMINAL HYSTERECTOMY     BSO- fibroids   ABI's     normal   dexa  1/05   osteoporosis   ESOPHAGOGASTRODUODENOSCOPY N/A 03/21/2014   Procedure: ESOPHAGOGASTRODUODENOSCOPY (EGD);  Surgeon: Milus Banister, MD;  Location: [facility];  Service: Endoscopy;  Laterality: N/A;   left foot brace     WRIST FRACTURE SURGERY  2/01   R arm   WRIST FRACTURE SURGERY Left 02/12/2018  OB History   No obstetric history on file.      Home Medications    Prior to Admission medications   Medication Sig Start Date End Date Taking? Authorizing Provider  acidophilus (RISAQUAD) CAPS capsule Take 1 capsule by mouth daily.   Yes [provider]  Cholecalciferol (VITAMIN D-3) 1000 units CAPS Take 1 capsule See admin instructions by mouth. Take 1000 units in the morning and 2000 in the evening   Yes [provider]  clopidogrel (PLAVIX) 75 MG tablet Hold as of 12/31/18 Patient taking differently: Take 75 mg by mouth daily. Hold as of 12/31/18 12/31/18  Yes Tonia Ghent, MD    donepezil (ARICEPT) 10 MG tablet TAKE 1 TABLET (10 MG TOTAL) BY MOUTH AT BEDTIME. 07/26/18  Yes Melvenia Beam, MD  Fexofenadine HCl (ALLERGY 24-HR PO) Take 1 capsule by mouth daily.   Yes [provider]  hydrochlorothiazide (HYDRODIURIL) 25 MG tablet Take 1 tablet (25 mg total) by mouth daily. 04/17/18  Yes Tower, Wynelle Fanny, MD  hydrocortisone (ANUSOL-HC) 2.5 % rectal cream Place 1 application rectally 2 (two) times daily as needed for hemorrhoids or anal itching. 12/30/18  Yes Tower, Wynelle Fanny, MD  losartan (COZAAR) 100 MG tablet Take 1 tablet (100 mg total) by mouth daily. 08/20/18  Yes Tower, Wynelle Fanny, MD  memantine (NAMENDA) 10 MG tablet TAKE 1 TABLET (10 MG TOTAL) BY MOUTH 2 (TWO) TIMES DAILY. 06/10/18  Yes Melvenia Beam, MD  metoprolol tartrate (LOPRESSOR) 25 MG tablet Take 1 tablet (25 mg total) by mouth 2 (two) times daily. 04/17/18  Yes Tower, Wynelle Fanny, MD  Multiple Vitamins-Minerals ([employer] SPECTRAVITE PO) Take 1 capsule by mouth daily.   Yes [provider]  potassium chloride (KLOR-CON M10) 10 MEQ tablet Take 2 tablets (20 mEq total) by mouth daily. 04/17/18  Yes Tower, Wynelle Fanny, MD  vitamin B-12 (CYANOCOBALAMIN) 1000 MCG tablet Take 1,000 mcg by mouth daily.   Yes [provider]    Family History Family History  Problem Relation Age of Onset   Heart attack Father    Hypertension Father    Heart attack Mother    Throat cancer Brother    Breast cancer Daughter     Social History Social History   Tobacco Use   Smoking status: Never Smoker   Smokeless tobacco: Never Used  Substance Use Topics   Alcohol use: No    Alcohol/week: 0.0 standard drinks   Drug use: No   Pt lives with her daughter and 2 adult grandsons at home. She also has a caregiver watching her 5 hours a day. Pt is unable to perform any of her own ADLs and required 24 hr care.   Allergies   Alendronate sodium, Amlodipine besylate, Calcitonin (salmon), and  Simvastatin   Review of Systems Can not be obtained due to pt's mental status  Physical Exam Updated Vital Signs BP 122/82    Pulse 84    Temp 98 F (36.7 C)    Resp 16    SpO2 94%   Physical Exam Vitals signs and nursing note reviewed.  Constitutional:      General: She is not in acute distress.    Appearance: She is well-developed.  HENT:     Head: Normocephalic and atraumatic.  Eyes:     Conjunctiva/sclera: Conjunctivae normal.  Neck:     Musculoskeletal: Normal range of motion.  Cardiovascular:     Rate and Rhythm: Normal rate and regular rhythm.  Heart sounds: No murmur.  Pulmonary:     Effort: Pulmonary effort is normal. No respiratory distress.     Breath sounds: Normal breath sounds.  Abdominal:     Palpations: Abdomen is soft.     Tenderness: There is no abdominal tenderness.  Skin:    General: Skin is warm and dry.  Neurological:     Mental Status: She is alert.     Comments: Pt is not oriented to place or time, which is her baseline      ED Treatments / Results  Labs (all labs ordered are listed, but only abnormal results are displayed) Labs Reviewed  BASIC METABOLIC PANEL - Abnormal; Notable for the following components:      Result Value   Glucose, Bld 109 (*)    Creatinine, Ser 1.25 (*)    GFR calc non Af Amer 39 (*)    GFR calc Af Amer 45 (*)    All other components within normal limits  CBC    EKG None  Radiology Ct Head Wo Contrast  Result Date: 01/18/2019 CLINICAL DATA:  85 year old who fell while getting out of her chair at home, striking her chin on a table. No loss of consciousness. Current history of dementia. Initial encounter. EXAM: CT HEAD WITHOUT CONTRAST CT CERVICAL SPINE WITHOUT CONTRAST TECHNIQUE: Multidetector CT imaging of the head and cervical spine was performed following the standard protocol without intravenous contrast. Multiplanar CT image reconstructions of the cervical spine were also generated. COMPARISON:  CT head  07/05/2017 and earlier. MRI brain 05/07/2017 and earlier. CT cervical spine 05/06/2017 and earlier. FINDINGS: CT HEAD FINDINGS Head tilt in the gantry accounts for apparent asymmetry in the cerebral hemispheres. Patient motion blurred images at the vertex. Overall, the study appears diagnostic. Brain: Moderate age appropriate cortical and deep atrophy, unchanged. Moderate changes of small vessel disease of the white matter diffusely, unchanged. No mass lesion. No midline shift. No acute hemorrhage or hematoma. No extra-axial fluid collections. No evidence of acute infarction. Vascular: Severe BILATERAL carotid siphon and BILATERAL vertebral artery atherosclerosis. RIGHT internal carotid artery terminus aneurysm up to approximately 9 mm, unchanged. No hyperdense vessel. Skull: Mild thickening of the diploic space of the skull diffusely, unchanged. No skull fracture or other focal osseous abnormality involving the skull. Sinuses/Orbits: Visualized paranasal sinuses, bilateral mastoid air cells and bilateral middle ear cavities well-aerated. Visualized orbits and globes normal in appearance. Other: None. CT CERVICAL SPINE FINDINGS Initial cervical spine images were degraded by patient motion but these were repeated and a diagnostic study was obtained. Alignment: Anatomic POSTERIOR alignment. Facet joints anatomically aligned throughout with diffuse degenerative changes. Skull base and vertebrae: No fractures identified involving the cervical spine. Coronal reformatted images demonstrate an intact craniocervical junction, intact dens and intact lateral masses throughout. Soft tissues and spinal canal: No evidence of paraspinous or spinal canal hematoma. No evidence of spinal stenosis. Disc levels: Severe disc space narrowing at C4-5, C5-6 and C6-7. Mild disc space narrowing at C7-T1. Facet and uncinate hypertrophy account for multilevel foraminal stenoses including severe BILATERAL C3-4, severe RIGHT and moderate LEFT  C4-5, severe RIGHT and moderate LEFT C5-6, moderate BILATERAL C6-7. Upper chest: Visualized lung apices clear. Atherosclerosis involving the visualized thoracic aorta and proximal great vessels. Other: Benign calcified nodule involving the LEFT lobe of the thyroid gland. BILATERAL cervical carotid atherosclerosis. IMPRESSION: 1. No acute intracranial abnormality. 2. Stable moderate generalized atrophy and moderate chronic microvascular ischemic changes of the white matter. 3. Stable approximate 9 mm  RIGHT internal carotid artery terminus aneurysm. 4. No cervical spine fractures identified. 5. Multilevel degenerative disc disease, spondylosis and facet degenerative changes with multilevel foraminal stenoses as detailed above. Electronically Signed   By: Evangeline Dakin M.D.   On: 01/18/2019 15:46   Ct Cervical Spine Wo Contrast  Result Date: 01/18/2019 CLINICAL DATA:  85 year old who fell while getting out of her chair at home, striking her chin on a table. No loss of consciousness. Current history of dementia. Initial encounter. EXAM: CT HEAD WITHOUT CONTRAST CT CERVICAL SPINE WITHOUT CONTRAST TECHNIQUE: Multidetector CT imaging of the head and cervical spine was performed following the standard protocol without intravenous contrast. Multiplanar CT image reconstructions of the cervical spine were also generated. COMPARISON:  CT head 07/05/2017 and earlier. MRI brain 05/07/2017 and earlier. CT cervical spine 05/06/2017 and earlier. FINDINGS: CT HEAD FINDINGS Head tilt in the gantry accounts for apparent asymmetry in the cerebral hemispheres. Patient motion blurred images at the vertex. Overall, the study appears diagnostic. Brain: Moderate age appropriate cortical and deep atrophy, unchanged. Moderate changes of small vessel disease of the white matter diffusely, unchanged. No mass lesion. No midline shift. No acute hemorrhage or hematoma. No extra-axial fluid collections. No evidence of acute infarction.  Vascular: Severe BILATERAL carotid siphon and BILATERAL vertebral artery atherosclerosis. RIGHT internal carotid artery terminus aneurysm up to approximately 9 mm, unchanged. No hyperdense vessel. Skull: Mild thickening of the diploic space of the skull diffusely, unchanged. No skull fracture or other focal osseous abnormality involving the skull. Sinuses/Orbits: Visualized paranasal sinuses, bilateral mastoid air cells and bilateral middle ear cavities well-aerated. Visualized orbits and globes normal in appearance. Other: None. CT CERVICAL SPINE FINDINGS Initial cervical spine images were degraded by patient motion but these were repeated and a diagnostic study was obtained. Alignment: Anatomic POSTERIOR alignment. Facet joints anatomically aligned throughout with diffuse degenerative changes. Skull base and vertebrae: No fractures identified involving the cervical spine. Coronal reformatted images demonstrate an intact craniocervical junction, intact dens and intact lateral masses throughout. Soft tissues and spinal canal: No evidence of paraspinous or spinal canal hematoma. No evidence of spinal stenosis. Disc levels: Severe disc space narrowing at C4-5, C5-6 and C6-7. Mild disc space narrowing at C7-T1. Facet and uncinate hypertrophy account for multilevel foraminal stenoses including severe BILATERAL C3-4, severe RIGHT and moderate LEFT C4-5, severe RIGHT and moderate LEFT C5-6, moderate BILATERAL C6-7. Upper chest: Visualized lung apices clear. Atherosclerosis involving the visualized thoracic aorta and proximal great vessels. Other: Benign calcified nodule involving the LEFT lobe of the thyroid gland. BILATERAL cervical carotid atherosclerosis. IMPRESSION: 1. No acute intracranial abnormality. 2. Stable moderate generalized atrophy and moderate chronic microvascular ischemic changes of the white matter. 3. Stable approximate 9 mm RIGHT internal carotid artery terminus aneurysm. 4. No cervical spine fractures  identified. 5. Multilevel degenerative disc disease, spondylosis and facet degenerative changes with multilevel foraminal stenoses as detailed above. Electronically Signed   By: Evangeline Dakin M.D.   On: 01/18/2019 15:46    Procedures Procedures (including critical care time)  Medications Ordered in ED Medications - No data to display   Initial Impression / Assessment and Plan / ED Course  I have reviewed the triage vital signs and the nursing notes.  Pertinent labs & imaging results that were available during my care of the patient were reviewed by me and considered in my medical decision making (see chart for details).   Ms. Schlauch is a 85 year old F who presented to the ED after  a witnessed fall. She denied being in any pain. Pt remained hemodynamically stable. CBC and BMP were normal showing no cause for pt's fall. CT head and C-spine were unremarkable. Pt was cleared for discharge home. As no clear cause was determined for her fall, pt and family were instructed to call her PCP tomorrow for follow-up and additional outpatient work-up regarding her recent fall.      Final Clinical Impressions(s) / ED Diagnoses   Final diagnoses:  Fall, initial encounter    ED Discharge Orders    None       Ladona Horns, MD 01/18/19 [place], Scott, DO 01/18/19 [id number]

## 2019-01-18 NOTE — ED Notes (Signed)
Updated daughters on plan of care

## 2019-01-18 NOTE — ED Notes (Signed)
Patient Alert and oriented to baseline. Stable and ambulatory to baseline. Patient verbalized understanding of the discharge instructions.  Patient belongings were taken by the patient.   

## 2019-01-18 NOTE — ED Triage Notes (Signed)
Pt bib ems from home with reports of fall approx 1 hour ago while moving out of her chair. Witnessed fall by family. Hit chin on table. No LOC. Hx dementia. Pt at baseline per family. Pt stopped plavix on 7/2 and started back today. 130/88, HR 82, RR 16, 93% RA, CBG 185.

## 2019-01-20 ENCOUNTER — Encounter: Payer: Self-pay | Admitting: Family Medicine

## 2019-01-20 ENCOUNTER — Ambulatory Visit (INDEPENDENT_AMBULATORY_CARE_PROVIDER_SITE_OTHER): Payer: Medicare Other | Admitting: Family Medicine

## 2019-01-20 ENCOUNTER — Other Ambulatory Visit: Payer: Self-pay

## 2019-01-20 VITALS — BP 134/78 | HR 71 | Temp 98.0°F | Ht 60.0 in

## 2019-01-20 DIAGNOSIS — N3000 Acute cystitis without hematuria: Secondary | ICD-10-CM | POA: Diagnosis not present

## 2019-01-20 DIAGNOSIS — I720 Aneurysm of carotid artery: Secondary | ICD-10-CM | POA: Diagnosis not present

## 2019-01-20 DIAGNOSIS — F0390 Unspecified dementia without behavioral disturbance: Secondary | ICD-10-CM

## 2019-01-20 DIAGNOSIS — I639 Cerebral infarction, unspecified: Secondary | ICD-10-CM | POA: Diagnosis not present

## 2019-01-20 DIAGNOSIS — W19XXXS Unspecified fall, sequela: Secondary | ICD-10-CM

## 2019-01-20 DIAGNOSIS — N390 Urinary tract infection, site not specified: Secondary | ICD-10-CM | POA: Insufficient documentation

## 2019-01-20 DIAGNOSIS — R4182 Altered mental status, unspecified: Secondary | ICD-10-CM

## 2019-01-20 DIAGNOSIS — R400 Somnolence: Secondary | ICD-10-CM | POA: Diagnosis not present

## 2019-01-20 DIAGNOSIS — I6381 Other cerebral infarction due to occlusion or stenosis of small artery: Secondary | ICD-10-CM

## 2019-01-20 DIAGNOSIS — Y92009 Unspecified place in unspecified non-institutional (private) residence as the place of occurrence of the external cause: Secondary | ICD-10-CM | POA: Diagnosis not present

## 2019-01-20 DIAGNOSIS — W19XXXA Unspecified fall, initial encounter: Secondary | ICD-10-CM | POA: Insufficient documentation

## 2019-01-20 LAB — POC URINALSYSI DIPSTICK (AUTOMATED)
Bilirubin, UA: 1
Blood, UA: NEGATIVE
Glucose, UA: NEGATIVE
Ketones, UA: 5
Leukocytes, UA: NEGATIVE
Nitrite, UA: NEGATIVE
Protein, UA: POSITIVE — AB
Spec Grav, UA: 1.025 (ref 1.010–1.025)
Urobilinogen, UA: 0.2 E.U./dL
pH, UA: 6 (ref 5.0–8.0)

## 2019-01-20 MED ORDER — CEPHALEXIN 500 MG PO CAPS: 500.0000 mg | ORAL_CAPSULE | Freq: Three times a day (TID) | ORAL | 0 refills | Status: DC

## 2019-01-20 NOTE — Assessment & Plan Note (Addendum)
Since fall in which she hit her chin on a table  Reassuring CT of head and CS in ED Reviewed hospital records, lab results and studies in detail   Today much more sedated / almost obtunded  Per family-she did get this way in the past after trauma and a uti Empiric tx for uti with keflex /cx pending  Disc poss of evolving subdural hematoma with family- they chose to not re CT head at this time but obs first  Disc what to watch for (see avs)  Will follow closely

## 2019-01-20 NOTE — Progress Notes (Signed)
Subjective:    Patient ID: Wendy Krueger, female    DOB: 04-02-1930, 85 y.o.   MRN: [id number]  HPI Here ofr ED visit f/u from 7/20    She was brought to ED by ems after witnessed fall  [place] while using walker [place] on R knee with L leg outstretched and hit chin on TV stand  Then vomited  Lips turned purple briefly  Baseline severe dementia- hx from family  No seizure activity noted    Labs Results for orders placed or performed during the hospital encounter of 01/18/19  CBC  Result Value Ref Range   WBC 8.2 4.0 - 10.5 K/uL   RBC 4.42 3.87 - 5.11 MIL/uL   Hemoglobin 13.5 12.0 - 15.0 g/dL   HCT 42.9 36.0 - 46.0 %   MCV 97.1 80.0 - 100.0 fL   MCH 30.5 26.0 - 34.0 pg   MCHC 31.5 30.0 - 36.0 g/dL   RDW 13.2 11.5 - 15.5 %   Platelets 164 150 - 400 K/uL   nRBC 0.0 0.0 - 0.2 %  Basic metabolic panel  Result Value Ref Range   Sodium 136 135 - 145 mmol/L   Potassium 3.8 3.5 - 5.1 mmol/L   Chloride 98 98 - 111 mmol/L   CO2 27 22 - 32 mmol/L   Glucose, Bld 109 (H) 70 - 99 mg/dL   BUN 13 8 - 23 mg/dL   Creatinine, Ser 1.25 (H) 0.44 - 1.00 mg/dL   Calcium 10.3 8.9 - 10.3 mg/dL   GFR calc non Af Amer 39 (L) >60 mL/min   GFR calc Af Amer 45 (L) >60 mL/min   Anion gap 11 5 - 15    Imaging Ct Head Wo Contrast  Result Date: 01/18/2019 CLINICAL DATA:  85 year old who fell while getting out of her chair at home, striking her chin on a table. No loss of consciousness. Current history of dementia. Initial encounter. EXAM: CT HEAD WITHOUT CONTRAST CT CERVICAL SPINE WITHOUT CONTRAST TECHNIQUE: Multidetector CT imaging of the head and cervical spine was performed following the standard protocol without intravenous contrast. Multiplanar CT image reconstructions of the cervical spine were also generated. COMPARISON:  CT head 07/05/2017 and earlier. MRI brain 05/07/2017 and earlier. CT cervical spine 05/06/2017 and earlier. FINDINGS: CT HEAD FINDINGS Head tilt in the gantry accounts for  apparent asymmetry in the cerebral hemispheres. Patient motion blurred images at the vertex. Overall, the study appears diagnostic. Brain: Moderate age appropriate cortical and deep atrophy, unchanged. Moderate changes of small vessel disease of the white matter diffusely, unchanged. No mass lesion. No midline shift. No acute hemorrhage or hematoma. No extra-axial fluid collections. No evidence of acute infarction. Vascular: Severe BILATERAL carotid siphon and BILATERAL vertebral artery atherosclerosis. RIGHT internal carotid artery terminus aneurysm up to approximately 9 mm, unchanged. No hyperdense vessel. Skull: Mild thickening of the diploic space of the skull diffusely, unchanged. No skull fracture or other focal osseous abnormality involving the skull. Sinuses/Orbits: Visualized paranasal sinuses, bilateral mastoid air cells and bilateral middle ear cavities well-aerated. Visualized orbits and globes normal in appearance. Other: None. CT CERVICAL SPINE FINDINGS Initial cervical spine images were degraded by patient motion but these were repeated and a diagnostic study was obtained. Alignment: Anatomic POSTERIOR alignment. Facet joints anatomically aligned throughout with diffuse degenerative changes. Skull base and vertebrae: No fractures identified involving the cervical spine. Coronal reformatted images demonstrate an intact craniocervical junction, intact dens and intact lateral masses throughout. Soft tissues and spinal  canal: No evidence of paraspinous or spinal canal hematoma. No evidence of spinal stenosis. Disc levels: Severe disc space narrowing at C4-5, C5-6 and C6-7. Mild disc space narrowing at C7-T1. Facet and uncinate hypertrophy account for multilevel foraminal stenoses including severe BILATERAL C3-4, severe RIGHT and moderate LEFT C4-5, severe RIGHT and moderate LEFT C5-6, moderate BILATERAL C6-7. Upper chest: Visualized lung apices clear. Atherosclerosis involving the visualized thoracic  aorta and proximal great vessels. Other: Benign calcified nodule involving the LEFT lobe of the thyroid gland. BILATERAL cervical carotid atherosclerosis. IMPRESSION: 1. No acute intracranial abnormality. 2. Stable moderate generalized atrophy and moderate chronic microvascular ischemic changes of the white matter. 3. Stable approximate 9 mm RIGHT internal carotid artery terminus aneurysm. 4. No cervical spine fractures identified. 5. Multilevel degenerative disc disease, spondylosis and facet degenerative changes with multilevel foraminal stenoses as detailed above. Electronically Signed   By: Evangeline Dakin M.D.   On: 01/18/2019 15:46   Ct Cervical Spine Wo Contrast  Result Date: 01/18/2019 CLINICAL DATA:  85 year old who fell while getting out of her chair at home, striking her chin on a table. No loss of consciousness. Current history of dementia. Initial encounter. EXAM: CT HEAD WITHOUT CONTRAST CT CERVICAL SPINE WITHOUT CONTRAST TECHNIQUE: Multidetector CT imaging of the head and cervical spine was performed following the standard protocol without intravenous contrast. Multiplanar CT image reconstructions of the cervical spine were also generated. COMPARISON:  CT head 07/05/2017 and earlier. MRI brain 05/07/2017 and earlier. CT cervical spine 05/06/2017 and earlier. FINDINGS: CT HEAD FINDINGS Head tilt in the gantry accounts for apparent asymmetry in the cerebral hemispheres. Patient motion blurred images at the vertex. Overall, the study appears diagnostic. Brain: Moderate age appropriate cortical and deep atrophy, unchanged. Moderate changes of small vessel disease of the white matter diffusely, unchanged. No mass lesion. No midline shift. No acute hemorrhage or hematoma. No extra-axial fluid collections. No evidence of acute infarction. Vascular: Severe BILATERAL carotid siphon and BILATERAL vertebral artery atherosclerosis. RIGHT internal carotid artery terminus aneurysm up to approximately 9 mm,  unchanged. No hyperdense vessel. Skull: Mild thickening of the diploic space of the skull diffusely, unchanged. No skull fracture or other focal osseous abnormality involving the skull. Sinuses/Orbits: Visualized paranasal sinuses, bilateral mastoid air cells and bilateral middle ear cavities well-aerated. Visualized orbits and globes normal in appearance. Other: None. CT CERVICAL SPINE FINDINGS Initial cervical spine images were degraded by patient motion but these were repeated and a diagnostic study was obtained. Alignment: Anatomic POSTERIOR alignment. Facet joints anatomically aligned throughout with diffuse degenerative changes. Skull base and vertebrae: No fractures identified involving the cervical spine. Coronal reformatted images demonstrate an intact craniocervical junction, intact dens and intact lateral masses throughout. Soft tissues and spinal canal: No evidence of paraspinous or spinal canal hematoma. No evidence of spinal stenosis. Disc levels: Severe disc space narrowing at C4-5, C5-6 and C6-7. Mild disc space narrowing at C7-T1. Facet and uncinate hypertrophy account for multilevel foraminal stenoses including severe BILATERAL C3-4, severe RIGHT and moderate LEFT C4-5, severe RIGHT and moderate LEFT C5-6, moderate BILATERAL C6-7. Upper chest: Visualized lung apices clear. Atherosclerosis involving the visualized thoracic aorta and proximal great vessels. Other: Benign calcified nodule involving the LEFT lobe of the thyroid gland. BILATERAL cervical carotid atherosclerosis. IMPRESSION: 1. No acute intracranial abnormality. 2. Stable moderate generalized atrophy and moderate chronic microvascular ischemic changes of the white matter. 3. Stable approximate 9 mm RIGHT internal carotid artery terminus aneurysm. 4. No cervical spine fractures identified. 5. Multilevel degenerative  disc disease, spondylosis and facet degenerative changes with multilevel foraminal stenoses as detailed above.  Electronically Signed   By: Evangeline Dakin M.D.   On: 01/18/2019 15:46    Today daughter is very concerned-she is having difficulty staying awake   Had just started back on plavix  Family is trying to push fluids water and lemonade  Family has to feed her   She c/o nausea coming home from the hospital but no more vomiting    No c/o HA Does not hold head like it hurts   Has been extremely sleepy since yesterday - impossible to keep her awake Slept and mumbled all night  She has not been able to walk with her walker - family transfers with a gait belt   Has these 'weak spells' about once per year Pre syncopal   ua today Results for orders placed or performed in visit on 01/20/19  POCT Urinalysis Dipstick (Automated)  Result Value Ref Range   Color, UA Dark Yellow    Clarity, UA Clear    Glucose, UA Negative Negative   Bilirubin, UA 1 mg/dL    Ketones, UA 5 mg/dL    Spec Grav, UA 1.025 1.010 - 1.025   Blood, UA Negative    pH, UA 6.0 5.0 - 8.0   Protein, UA Positive (A) Negative   Urobilinogen, UA 0.2 0.2 or 1.0 E.U./dL   Nitrite, UA Negative    Leukocytes, UA Negative Negative    Yesterday she felt very cold  Not warm to the touch   Patient Active Problem List   Diagnosis Date Noted   Change in mental status 01/20/2019   UTI (urinary tract infection) 01/20/2019   Fall at home 01/20/2019   Transient alteration of awareness 07/14/2018   Cough 05/22/2018   Contusion of right knee 05/22/2018   Chronic diarrhea 02/17/2018   Distal radius fracture, left 02/17/2018   Hypokalemia 02/09/2018   History of lower GI bleeding 02/09/2018   Cerebellar stroke ([place]) 03/10/2016   Carotid aneurysm, right ([place]) 02/21/2016   Lipoma of shoulder 03/27/2015   History of closed Colles' fracture 09/06/2014   Allergic rhinitis 77/41/2878   Diastolic dysfunction 67/67/2094   History of GI bleed 03/21/2014   BRBPR (bright red blood per rectum) 03/19/2014   Dementia  ([place]) 09/01/2013   Dementia, senile ([place]) 09/01/2013   Anxiety 09/01/2013   Encounter for Medicare annual wellness exam 01/08/2013   Gallstones 11/11/2011   Arterial ischemic stroke, 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   Risk for falls 06/04/2011   Constipation 03/05/2011   Vitamin D deficiency 03/28/2009   HYPERCHOLESTEROLEMIA, PURE 03/17/2007   Essential hypertension 03/16/2007   OSTEOARTHRITIS 03/16/2007   Osteoporosis 03/16/2007   Past Medical History:  Diagnosis Date   Asymptomatic gallstones    BRBPR (bright red blood per rectum)    Cataract 04/13/2018   bilateral eyes   Cholelithiasis    Colitis - presumed infectious origin    one ER visit    Dementia ([place])    Duodenitis    Fall    Gastritis    GI bleed    Hiatal hernia    Hyperlipidemia    Hypertension    Osteoarthritis    Osteoporosis    Stroke [facility])    Syncope    Past Surgical History:  Procedure Laterality Date   ABDOMINAL HYSTERECTOMY     BSO- fibroids   ABI's     normal   dexa  1/05   osteoporosis   ESOPHAGOGASTRODUODENOSCOPY N/A 03/21/2014  Procedure: ESOPHAGOGASTRODUODENOSCOPY (EGD);  Surgeon: Milus Banister, MD;  Location: [place];  Service: Endoscopy;  Laterality: N/A;   left foot brace     WRIST FRACTURE SURGERY  2/01   R arm   WRIST FRACTURE SURGERY Left 02/12/2018   Social History   Tobacco Use   Smoking status: Never Smoker   Smokeless tobacco: Never Used  Substance Use Topics   Alcohol use: No    Alcohol/week: 0.0 standard drinks   Drug use: No   Family History  Problem Relation Age of Onset   Heart attack Father    Hypertension Father    Heart attack Mother    Throat cancer Brother    Breast cancer Daughter    Allergies  Allergen Reactions   Alendronate Sodium Other (See Comments)    Leg pain    Amlodipine Besylate Hives   Calcitonin ([place]) Other (See Comments)     headache/ head pressure   Simvastatin Other (See Comments)     Leg pain    Current Outpatient Medications on File Prior to Visit  Medication Sig Dispense Refill   acidophilus (RISAQUAD) CAPS capsule Take 1 capsule by mouth daily.     Cholecalciferol (VITAMIN D-3) 1000 units CAPS Take 1 capsule See admin instructions by mouth. Take 1000 units in the morning and 2000 in the evening     clopidogrel (PLAVIX) 75 MG tablet Hold as of 12/31/18 (Patient taking differently: Take 75 mg by mouth daily. Hold as of 12/31/18)     donepezil (ARICEPT) 10 MG tablet TAKE 1 TABLET (10 MG TOTAL) BY MOUTH AT BEDTIME. 30 tablet 11   Fexofenadine HCl (ALLERGY 24-HR PO) Take 1 capsule by mouth daily.     hydrochlorothiazide (HYDRODIURIL) 25 MG tablet Take 1 tablet (25 mg total) by mouth daily. 90 tablet 3   hydrocortisone (ANUSOL-HC) 2.5 % rectal cream Place 1 application rectally 2 (two) times daily as needed for hemorrhoids or anal itching. 30 g 0   losartan (COZAAR) 100 MG tablet Take 1 tablet (100 mg total) by mouth daily. 90 tablet 3   memantine (NAMENDA) 10 MG tablet TAKE 1 TABLET (10 MG TOTAL) BY MOUTH 2 (TWO) TIMES DAILY. 60 tablet 11   metoprolol tartrate (LOPRESSOR) 25 MG tablet Take 1 tablet (25 mg total) by mouth 2 (two) times daily. 180 tablet 3   Multiple Vitamins-Minerals ([facility] SPECTRAVITE PO) Take 1 capsule by mouth daily.     potassium chloride (KLOR-CON M10) 10 MEQ tablet Take 2 tablets (20 mEq total) by mouth daily. 180 tablet 3   vitamin B-12 (CYANOCOBALAMIN) 1000 MCG tablet Take 1,000 mcg by mouth daily.     No current facility-administered medications on file prior to visit.     Review of Systems  Constitutional: Positive for activity change and appetite change. Negative for chills, diaphoresis, fatigue, fever and unexpected weight change.  HENT: Negative for congestion, ear pain, rhinorrhea, sinus pressure, sore throat, trouble swallowing and voice change.   Eyes: Negative for pain, redness and visual disturbance.  Respiratory: Negative for  cough, choking, shortness of breath and wheezing.   Cardiovascular: Negative for chest pain, palpitations and leg swelling.  Gastrointestinal: Positive for nausea. Negative for abdominal pain, blood in stool, constipation, diarrhea and vomiting.  Endocrine: Negative for polydipsia and polyuria.  Genitourinary: Positive for frequency. Negative for difficulty urinating, dysuria, hematuria and urgency.  Musculoskeletal: Negative for arthralgias, back pain and myalgias.  Skin: Negative for pallor and rash.  Allergic/Immunologic: Negative for environmental allergies.  Neurological: Negative for dizziness, seizures, syncope, facial asymmetry, light-headedness and headaches.       Severe dementia  Generalized weakness Poor balance with frequent falls  Hematological: Negative for adenopathy. Bruises/bleeds easily.  Psychiatric/Behavioral: Positive for confusion and decreased concentration. Negative for agitation, dysphoric mood, self-injury and sleep disturbance. The patient is nervous/anxious.        Objective:   Physical Exam        Assessment & Plan:   Problem List Items Addressed This Visit      Cardiovascular and Mediastinum   Carotid aneurysm, right ([place])    Stable on recent head and neck CT      Cerebellar stroke ([facility])    No few focal deficits  Continues plavix  No changes on recent head CT after a fall         Nervous and Auditory   Dementia, senile ([place])    This continues to worsen  Worse MS change since recent fall (Reviewed hospital records, lab results and studies in detail)  Getting weaker and less cooperative/harder to transfer  Now she is more sedated /almost obtunded -unsure if due to worsened dementia s/p physical stress of fall  Family wants to avoid another CT unless absolutely necessary Borderline ua today-will tx for uti and monitor closely        Genitourinary   UTI (urinary tract infection)    Malodorous urine/concentrated with some ketones and  bilirubin Clinically acting like she does with uti (sedated/ms change)  Will tx emp with keflex and culture this  Family will continue to enc fluids       Relevant Medications   cephALEXin (KEFLEX) 500 MG capsule   Other Relevant Orders   Urine Culture     Other   Change in mental status - Primary    Since fall in which she hit her chin on a table  Reassuring CT of head and CS in ED Reviewed hospital records, lab results and studies in detail   Today much more sedated / almost obtunded  Per family-she did get this way in the past after trauma and a uti Empiric tx for uti with keflex /cx pending  Disc poss of evolving subdural hematoma with family- they chose to not re CT head at this time but obs first  Disc what to watch for (see avs)  Will follow closely       Relevant Orders   POCT Urinalysis Dipstick (Automated) (Completed)   Fall at home    Frail elderly female with severe dementia  Having gait difficulty/more weakness and does not cooperate with transfers Reviewed hospital records, lab results and studies in detail   Reassuring CT head and CS Disc close f/u for MS change - in light of risk of subdural hematoma with plavix  She needs 24 hour care -getting this with hired help at home

## 2019-01-20 NOTE — Assessment & Plan Note (Signed)
Frail elderly female with severe dementia  Having gait difficulty/more weakness and does not cooperate with transfers Reviewed hospital records, lab results and studies in detail   Reassuring CT head and CS Disc close f/u for MS change - in light of risk of subdural hematoma with plavix  She needs 24 hour care -getting this with hired help at home

## 2019-01-20 NOTE — Patient Instructions (Signed)
Give keflex for possible uti  Keep encouraging fluids  We will culture urine and get you the result   Watch for worsening mental status- get her to ED if needed/if things worsen Watch for headache/vomiting/dizziness   I cannot rule out a bleed in skull/brain -- we will want to get another CT scan if things do not improve

## 2019-01-20 NOTE — Assessment & Plan Note (Signed)
Stable on recent head and neck CT

## 2019-01-20 NOTE — Assessment & Plan Note (Signed)
Malodorous urine/concentrated with some ketones and bilirubin Clinically acting like she does with uti (sedated/ms change)  Will tx emp with keflex and culture this  Family will continue to enc fluids

## 2019-01-20 NOTE — Assessment & Plan Note (Addendum)
This continues to worsen  Worse MS change since recent fall (Reviewed hospital records, lab results and studies in detail)  Getting weaker and less cooperative/harder to transfer  Now she is more sedated /almost obtunded -unsure if due to worsened dementia s/p physical stress of fall  Family wants to avoid another CT unless absolutely necessary Borderline ua today-will tx for uti and monitor closely

## 2019-01-20 NOTE — Assessment & Plan Note (Signed)
No few focal deficits  Continues plavix  No changes on recent head CT after a fall

## 2019-01-22 ENCOUNTER — Telehealth: Payer: Self-pay | Admitting: *Deleted

## 2019-01-22 LAB — URINE CULTURE
MICRO NUMBER:: 692992
SPECIMEN QUALITY:: ADEQUATE

## 2019-01-22 NOTE — Telephone Encounter (Signed)
Left VM requesting pt to call the office back regarding urine cx 

## 2019-01-22 NOTE — Telephone Encounter (Signed)
Addressed through result notes  

## 2019-01-22 NOTE — Telephone Encounter (Signed)
Pt's daughter returned your call and is requesting a cb

## 2019-01-25 ENCOUNTER — Telehealth: Payer: Self-pay | Admitting: *Deleted

## 2019-01-25 NOTE — Telephone Encounter (Signed)
I wonder (though it never caused problems in the past) if it is a side effect of plavix  Let me know how she does this week w/o it- if she perks up  If worse-let me know

## 2019-01-25 NOTE — Telephone Encounter (Signed)
Thanks for letting me know  Stop plavix Any anticoagulation will make her bleeding worse so I will not change to anything else now (no aspirin due to GI issues in the past either)  Please update me in 2-3 d to let me know if /when bleeding stops and to let us know how she is feeling

## 2019-01-25 NOTE — Telephone Encounter (Signed)
Wendy Krueger's daughter [name] notified of Dr. Marliss Coots instructions and verbalized understanding. [name] said Wendy Krueger only had one flare up of rectal bleeding, they put some OTC hemorrhoid med on it and it seem to help. Daughter will hold Plavix 2-3 days to make sure it doesn't reoccur and call with an update.  Daughter did want me to let Dr. Glori Bickers know that Wendy Krueger is back to that "sleepy state" that Dr. Glori Bickers saw when she was here for her OV. Wendy Krueger perked up the next day and was talkative and her normal self but then the next day she was back to only wanting to sleep and not do anything else. Wendy Krueger's daughter just wanted me to let Dr. Glori Bickers know incase she can recommend anything else or can give them some advise on Wendy Krueger's state

## 2019-01-25 NOTE — Telephone Encounter (Signed)
Pt's daughter [name] notified of Dr. Marliss Coots comments and instructions and verbalized understanding

## 2019-01-25 NOTE — Telephone Encounter (Signed)
[name] caledl stating that her mom is having rectal bleeding again and is on Plavix. [name] wants to know if patient can be put on something else. Patient was started back on the Plavix July 19, [name] stated that she did not have any bleeding until yesterday and only one time. Patient is sleeping a lot more recently.  [name] stated that they do not want the bleeding to get out of control. [facility]

## 2019-01-28 NOTE — Telephone Encounter (Signed)
Wendy Krueger (DPR signed) said pt has been off plavix since 01/24/19 in AM. Pt is more alert and awake more. Pt walked with walker on 01/27/19 instead of being in w/c. Pt has perked up. Pt finished abx also. Wendy Krueger request cb with whether pt should restart Plavix or stay off of it. [facility].

## 2019-01-28 NOTE — Telephone Encounter (Signed)
Daughter notified of Dr. Marliss Coots instructions and verbalized understanding, med removed from med list and added to allergy list

## 2019-01-28 NOTE — Addendum Note (Signed)
Addended by: Tammi Sou on: 01/28/2019 12:02 PM   Modules accepted: Orders

## 2019-01-28 NOTE — Telephone Encounter (Signed)
Glad she is doing better  I think plavix no longer agrees with her and the risks outweigh the benefits  Stay off of it  Please take it off med list and add to med intolerance list

## 2019-02-10 ENCOUNTER — Other Ambulatory Visit: Payer: Self-pay | Admitting: Neurology

## 2019-02-10 DIAGNOSIS — I6381 Other cerebral infarction due to occlusion or stenosis of small artery: Secondary | ICD-10-CM

## 2019-02-17 ENCOUNTER — Telehealth: Payer: Self-pay | Admitting: *Deleted

## 2019-02-17 NOTE — Telephone Encounter (Signed)
Patent's daughter [name] called stating that she thinks that her mom has another UTI. [name] stated that she started yesterday afternoon "being out of it and talking crazy". [name] stated that every time they touch her she tells them they are hurting her. [name] stated that in the mornings her urine is dark, but as the day goes on it clears during the day because she is drinking. [name] stated that she does not have a fever , but is shaking and they have blankets on her. [name] stated that her urine has an odor to it. [name] stated that they are having to feed her and she usually feeds herself. [name] wants to know can she be started on an antibiotic or can they bring a urine? [facility] [address]

## 2019-02-17 NOTE — Telephone Encounter (Signed)
Daughter will drop off urine sample tomorrow morning, daughter also notified of Dr. Marliss Coots comments

## 2019-02-17 NOTE — Telephone Encounter (Signed)
Bring in a urine please and then we will start abx if needed Thanks for letting me know  If symptoms become severe go to ED Encourage fluid intake

## 2019-02-18 ENCOUNTER — Other Ambulatory Visit: Payer: Self-pay

## 2019-02-18 ENCOUNTER — Other Ambulatory Visit (INDEPENDENT_AMBULATORY_CARE_PROVIDER_SITE_OTHER): Payer: Medicare Other

## 2019-02-18 ENCOUNTER — Other Ambulatory Visit: Payer: Medicare Other

## 2019-02-18 DIAGNOSIS — R4182 Altered mental status, unspecified: Secondary | ICD-10-CM | POA: Diagnosis not present

## 2019-02-18 LAB — POC URINALSYSI DIPSTICK (AUTOMATED)
Bilirubin, UA: NEGATIVE
Blood, UA: NEGATIVE
Glucose, UA: NEGATIVE
Ketones, UA: NEGATIVE
Leukocytes, UA: NEGATIVE
Nitrite, UA: NEGATIVE
Protein, UA: POSITIVE — AB
Spec Grav, UA: 1.02 (ref 1.010–1.025)
Urobilinogen, UA: 1 E.U./dL
pH, UA: 6 (ref 5.0–8.0)

## 2019-02-18 NOTE — Telephone Encounter (Signed)
Please push fluids and bring one in when she urinates.  If she does not urinate in the next 3 hours or so that is worrisome - consider ED visit for dehydration (which may also cause her symptoms)

## 2019-02-18 NOTE — Telephone Encounter (Signed)
[name] called and left a voicemail making Dr Glori Bickers aware that they are unable to get a urine on the patient to bring by today. No current urine output at all. She states that the patient may need to be cathed. Requesting a call back with further instruction - in office visit or ED. Please advise.

## 2019-02-18 NOTE — Telephone Encounter (Signed)
Daughter brought urine sample at 4:55pm, still concerning because it was a very small amount, just enough to do a UA and Culture and that's all she has urinated all day

## 2019-02-18 NOTE — Telephone Encounter (Addendum)
I will forward this to [place] (Dr. Marliss Coots CMA) to keep an eye on this situation for PCP and monitor in case family brings a urine sample in or needs further follow up.

## 2019-02-18 NOTE — Telephone Encounter (Signed)
Spoke with [name], states that the patient has not urinated since 3pm yesterday afternoon - they have been pushing lots of fluids (water, lemonade, decaf tea) to try and get her to urinate and they are not able to get her to go the bathroom. Advised of recommendations per Dr Glori Bickers to take to ED if still no urination in the next few hours or if sx's worsen. Aware that she may be dehydrated and at this point if not urinating she may be dehydrated and needs fluids. Family to keep an eye out and will contact our office if able to obtain a urine sample - if not then they were take her on to the ED to be evaluated.   Will send to Dr Glori Bickers as Juluis Rainier   Will also send to [facility] for follow up this afternoon

## 2019-02-19 LAB — URINE CULTURE
MICRO NUMBER:: 793429
SPECIMEN QUALITY:: ADEQUATE

## 2019-02-19 NOTE — Telephone Encounter (Signed)
Spoke to Wendy Krueger, she is aware as instructed and expressed understanding... she will get a call back once the urine culture comes back, no guarantee will be back before closing as it was collected late in the afternoon

## 2019-02-19 NOTE — Telephone Encounter (Signed)
Please tell her urine was clear  Keep pushing fluids and let me know if she does not start urinating more often (she was not yesterday and I was worried about dehydration)  We have a urine culture pending and will let them know when that returns

## 2019-02-19 NOTE — Telephone Encounter (Signed)
Wendy Krueger (DPR signed) left v/m requesting cb about urine specimen results.

## 2019-02-20 ENCOUNTER — Other Ambulatory Visit: Payer: Self-pay | Admitting: Neurology

## 2019-04-05 DIAGNOSIS — H2523 Age-related cataract, morgagnian type, bilateral: Secondary | ICD-10-CM | POA: Diagnosis not present

## 2019-04-13 ENCOUNTER — Other Ambulatory Visit: Payer: Self-pay

## 2019-04-15 ENCOUNTER — Other Ambulatory Visit: Payer: Self-pay | Admitting: Neurology

## 2019-04-15 ENCOUNTER — Ambulatory Visit (INDEPENDENT_AMBULATORY_CARE_PROVIDER_SITE_OTHER): Payer: Medicare Other

## 2019-04-15 ENCOUNTER — Ambulatory Visit: Payer: Medicare Other

## 2019-04-15 DIAGNOSIS — Z0000 Encounter for general adult medical examination without abnormal findings: Secondary | ICD-10-CM | POA: Diagnosis not present

## 2019-04-15 NOTE — Progress Notes (Signed)
Subjective:   Wendy Krueger is a 85 y.o. female who presents for Medicare Annual (Subsequent) preventive examination.  Review of Systems:    This visit is being conducted through telemedicine via telephone at the nurse health advisor's home address due to the COVID-19 pandemic. This patient has given me verbal consent via doximity to conduct this visit, patient states they are participating from their home address. Some vital signs may be absent or patient reported.    Patient identification: identified by name, DOB, and current address  Patient, myself and [name] (patient's daughter) on the telephone call. Patient gave verbal consent for [name] to participate in the telephone call and answer questions for her. There is no referral for this visit.   Cardiac Risk Factors include: advanced age (>13men, >77 women);dyslipidemia;hypertension     Objective:     Vitals: There were no vitals taken for this visit.  There is no height or weight on file to calculate BMI.  Advanced Directives 04/15/2019 01/18/2019 04/13/2018 02/09/2018 05/07/2017 05/06/2017 04/09/2017  Does Patient Have a Medical Advance Directive? Yes No Yes Yes - No Yes  Type of Paramedic of [place];Living will - [place];Living will [place];Living will  Does patient want to make changes to medical advance directive? - - - No - Patient declined - - -  Copy of [place] in Chart? No - copy requested - Yes No - copy requested - - No - copy requested  Would patient like information on creating a medical advance directive? - No - Patient declined - - No - Patient declined - -    Tobacco Social History   Tobacco Use  Smoking Status Never Smoker  Smokeless Tobacco Never Used     Counseling given: Not Answered   Clinical Intake:  Pre-visit preparation completed: Yes  Pain : No/denies pain      Nutritional Risks: None Diabetes: No  How often do you need to have someone help you when you read instructions, pamphlets, or other written materials from your doctor or pharmacy?: 1 - Never What is the last grade level you completed in school?: 11th  Interpreter Needed?: No  Information entered by :: CJohnson, LPN  Past Medical History:  Diagnosis Date  . Asymptomatic gallstones   . BRBPR (bright red blood per rectum)   . Cataract 04/13/2018   bilateral eyes  . Cholelithiasis   . Colitis - presumed infectious origin    one ER visit   . Dementia ([place])   . Duodenitis   . Fall   . Gastritis   . GI bleed   . Hiatal hernia   . Hyperlipidemia   . Hypertension   . Osteoarthritis   . Osteoporosis   . Stroke ([place])   . Syncope    Past Surgical History:  Procedure Laterality Date  . ABDOMINAL HYSTERECTOMY     BSO- fibroids  . ABI's     normal  . dexa  1/05   osteoporosis  . ESOPHAGOGASTRODUODENOSCOPY N/A 03/21/2014   Procedure: ESOPHAGOGASTRODUODENOSCOPY (EGD);  Surgeon: Milus Banister, MD;  Location: [facility];  Service: Endoscopy;  Laterality: N/A;  . left foot brace    . WRIST FRACTURE SURGERY  2/01   R arm  . WRIST FRACTURE SURGERY Left 02/12/2018   Family History  Problem Relation Age of Onset  . Heart attack Father   . Hypertension Father   .  Heart attack Mother   . Throat cancer Brother   . Breast cancer Daughter    Social History   Socioeconomic History  . Marital status: Widowed    Spouse name: Not on file  . Number of children: 5  . Years of education: 56  . Highest education level: Not on file  Occupational History  . Occupation: retired    Fish farm manager: RETIRED  Social Needs  . Financial resource strain: Not hard at all  . Food insecurity    Worry: Never true    Inability: Never true  . Transportation needs    Medical: No    Non-medical: No  Tobacco Use  . Smoking status: Never Smoker  . Smokeless tobacco: Never Used  Substance and  Sexual Activity  . Alcohol use: No    Alcohol/week: 0.0 standard drinks  . Drug use: No  . Sexual activity: Never  Lifestyle  . Physical activity    Days per week: 0 days    Minutes per session: 0 min  . Stress: To some extent  Relationships  . Social Herbalist on phone: Not on file    Gets together: Not on file    Attends religious service: Not on file    Active member of club or organization: Not on file    Attends meetings of clubs or organizations: Not on file    Relationship status: Not on file  Other Topics Concern  . Not on file  Social History Narrative   Has 4 brothers, 1 sister   Lives with 2 adult grandchildren and her daughter   Has 4 daughters, 1 son   Right handed   Rare caffeine     Outpatient Encounter Medications as of 04/15/2019  Medication Sig  . acidophilus (RISAQUAD) CAPS capsule Take 1 capsule by mouth daily.  . Cholecalciferol (VITAMIN D-3) 1000 units CAPS Take 1 capsule See admin instructions by mouth. Take 1000 units in the morning and 2000 in the evening  . donepezil (ARICEPT) 10 MG tablet TAKE 1 TABLET (10 MG TOTAL) BY MOUTH AT BEDTIME.  Marland Kitchen Fexofenadine HCl (ALLERGY 24-HR PO) Take 1 capsule by mouth daily.  . hydrochlorothiazide (HYDRODIURIL) 25 MG tablet Take 1 tablet (25 mg total) by mouth daily.  . hydrocortisone (ANUSOL-HC) 2.5 % rectal cream Place 1 application rectally 2 (two) times daily as needed for hemorrhoids or anal itching.  . losartan (COZAAR) 100 MG tablet Take 1 tablet (100 mg total) by mouth daily.  . memantine (NAMENDA) 10 MG tablet TAKE 1 TABLET (10 MG TOTAL) BY MOUTH 2 (TWO) TIMES DAILY.  . metoprolol tartrate (LOPRESSOR) 25 MG tablet Take 1 tablet (25 mg total) by mouth 2 (two) times daily.  . Multiple Vitamins-Minerals ([employer] SPECTRAVITE PO) Take 1 capsule by mouth daily.  . potassium chloride (KLOR-CON M10) 10 MEQ tablet Take 2 tablets (20 mEq total) by mouth daily.  . vitamin B-12 (CYANOCOBALAMIN) 1000 MCG tablet Take  1,000 mcg by mouth daily.  . cephALEXin (KEFLEX) 500 MG capsule Take 1 capsule (500 mg total) by mouth 3 (three) times daily. (Patient not taking: Reported on 04/15/2019)   No facility-administered encounter medications on file as of 04/15/2019.     Activities of Daily Living In your present state of health, do you have any difficulty performing the following activities: 04/15/2019  Hearing? N  Vision? Y  Comment patient has cataracts  Difficulty concentrating or making decisions? Y  Comment patient is confused all the time, has  dementia  Walking or climbing stairs? Y  Dressing or bathing? Y  Doing errands, shopping? Y  Preparing Food and eating ? Y  Using the Toilet? Y  In the past six months, have you accidently leaked urine? Y  Comment wears adult diapers  Do you have problems with loss of bowel control? Y  Comment wears adult diapers  Managing your Medications? Y  Managing your Finances? Y  Housekeeping or managing your Housekeeping? Y  Some recent data might be hidden    Patient Care Team: Tower, Wynelle Fanny, MD as PCP - General Shirl Harris, OD as Referring Physician (Optometry) Pleasant, Eppie Gibson, RN as [place] Management    Assessment:   This is a routine wellness examination for Yanis.  Exercise Activities and Dietary recommendations Current Exercise Habits: The patient does not participate in regular exercise at present, Exercise limited by: psychological condition(s)  Goals    . Patient Stated     04/15/2019, I will maintain and take medications as prescribed.     . Reduce sodium intake     Starting 04/13/2018, I will continue to monitor intake of sodium in diet. Daily intake should be less than 1500 mg.        Fall Risk Fall Risk  04/15/2019 07/29/2018 05/21/2018 04/13/2018 04/09/2017  Falls in the past year? 1 1 1  Yes Yes  Comment - - Emmi Telephone Survey: data to providers prior to load 2 falls due to loss of balance; 2nd fall  resulted in wrist fracture pt fell backwards after trying to sit down in a chair  Number falls in past yr: 1 0 1 2 or more 1  Comment - - Emmi Telephone Survey Actual Response = 2 - -  Injury with Fall? 0 1 1 Yes No  Comment - wrist fx - - -  Risk Factor Category  - - - High Fall Risk -  Risk for fall due to : History of fall(s);Impaired balance/gait;Impaired mobility;Medication side effect - - History of fall(s);Impaired balance/gait;Impaired mobility;Mental status change -  Follow up Falls evaluation completed;Falls prevention discussed - - - -   Is the patient's home free of loose throw rugs in walkways, pet beds, electrical cords, etc?   yes      Grab bars in the bathroom? yes      Handrails on the stairs?   yes      Adequate lighting?   yes  Timed Get Up and Go performed: N/A  Depression Screen PHQ 2/9 Scores 04/15/2019 04/13/2018 04/09/2017 02/21/2017  PHQ - 2 Score 0 0 0 2  PHQ- 9 Score 0 0 0 10     Cognitive Function MMSE - Mini Mental State Exam 04/15/2019 04/13/2018 04/09/2017 12/05/2016 12/05/2016  Not completed: Unable to complete Unable to complete (No Data) - -  Orientation to time - - - - 1  Orientation to Place - - - - 1  Registration - - - - 3  Attention/ Calculation - - - - 0  Recall - - - - 0  Language- name 2 objects - - - - 2  Language- repeat - - - - 0  Language- follow 3 step command - - - - 2  Language- read & follow direction - - - - 1  Write a sentence - - - 1 1  Copy design - - - 1 1  Total score - - - - 12  Mini Cog  Mini-Cog screen was not completed. Patient  has dementia and declined to complete this test. Maximum score is 22. A value of 0 denotes this part of the MMSE was not completed or the patient failed this part of the Mini-Cog screening.      Immunization History  Administered Date(s) Administered  . Influenza Split 06/04/2011, 07/10/2012  . Influenza,inj,Quad PF,6+ Mos 07/12/2014, 08/09/2016, 04/09/2017, 04/17/2018  . Pneumococcal  Conjugate-13 01/11/2014  . Pneumococcal Polysaccharide-23 06/04/2011  . Td 08/27/2004    Qualifies for Shingles Vaccine? yes  Screening Tests Health Maintenance  Topic Date Due  . INFLUENZA VACCINE  01/30/2019  . MAMMOGRAM  04/14/2020 (Originally 11/18/2018)  . TETANUS/TDAP  08/26/2024 (Originally 08/27/2014)  . DEXA SCAN  Completed  . PNA vac Low Risk Adult  Completed    Cancer Screenings: Lung: Low Dose CT Chest recommended if Age 13-80 years, 30 pack-year currently smoking OR have quit w/in 15years. Patient does not qualify. Breast:  Up to date on Mammogram? No, Patient declined  Up to date of Bone Density/Dexa? Yes, completed 07/11/2008 Colorectal: no longer required  Additional Screenings:  Hepatitis C Screening: n/a     Plan:    Patient wants to maintain and take medications as prescribed.   I have personally reviewed and noted the following in the patient's chart:   . Medical and social history . Use of alcohol, tobacco or illicit drugs  . Current medications and supplements . Functional ability and status . Nutritional status . Physical activity . Advanced directives . List of other physicians . Hospitalizations, surgeries, and ER visits in previous 12 months . Vitals . Screenings to include cognitive, depression, and falls . Referrals and appointments  In addition, I have reviewed and discussed with patient certain preventive protocols, quality metrics, and best practice recommendations. A written personalized care plan for preventive services as well as general preventive health recommendations were provided to patient.     Andrez Grime, LPN  [id number]

## 2019-04-15 NOTE — Progress Notes (Signed)
PCP notes: none  Health Maintenance: Declined mammogram, declined Tdap and Shingrix. Patient will get flu vaccine at upcoming physical.     Abnormal Screenings: none    Patient concerns:none    Nurse concerns: none    Next PCP appt.: 04/19/2019 @ 11:30 am

## 2019-04-15 NOTE — Patient Instructions (Signed)
Wendy Krueger , Thank you for taking time to come for your Medicare Wellness Visit. I appreciate your ongoing commitment to your health goals. Please review the following plan we discussed and let me know if I can assist you in the future.   Screening recommendations/referrals: Colonoscopy: no longer required Mammogram: declined Bone Density: up to date, completed 07/11/2008 Recommended yearly ophthalmology/optometry visit for glaucoma screening and checkup Recommended yearly dental visit for hygiene and checkup  Vaccinations: Influenza vaccine: will get at physical  Pneumococcal vaccine: series completed Tdap vaccine: declined Shingles vaccine: declined    Advanced directives: Please bring a copy of your POA (Power of Attorney) and/or Living Will to your next appointment.   Conditions/risks identified: hypertension, hypercholesterolemia  Next appointment: 04/19/2019 @ 11:30 am    Preventive Care 35 Years and Older, Female Preventive care refers to lifestyle choices and visits with your health care provider that can promote health and wellness. What does preventive care include?  A yearly physical exam. This is also called an annual well check.  Dental exams once or twice a year.  Routine eye exams. Ask your health care provider how often you should have your eyes checked.  Personal lifestyle choices, including:  Daily care of your teeth and gums.  Regular physical activity.  Eating a healthy diet.  Avoiding tobacco and drug use.  Limiting alcohol use.  Practicing safe sex.  Taking low-dose aspirin every day.  Taking vitamin and mineral supplements as recommended by your health care provider. What happens during an annual well check? The services and screenings done by your health care provider during your annual well check will depend on your age, overall health, lifestyle risk factors, and family history of disease. Counseling  Your health care provider may ask you  questions about your:  Alcohol use.  Tobacco use.  Drug use.  Emotional well-being.  Home and relationship well-being.  Sexual activity.  Eating habits.  History of falls.  Memory and ability to understand (cognition).  Work and work Statistician.  Reproductive health. Screening  You may have the following tests or measurements:  Height, weight, and BMI.  Blood pressure.  Lipid and cholesterol levels. These may be checked every 5 years, or more frequently if you are over 9 years old.  Skin check.  Lung cancer screening. You may have this screening every year starting at age 55 if you have a 30-pack-year history of smoking and currently smoke or have quit within the past 15 years.  Fecal occult blood test (FOBT) of the stool. You may have this test every year starting at age 14.  Flexible sigmoidoscopy or colonoscopy. You may have a sigmoidoscopy every 5 years or a colonoscopy every 10 years starting at age 75.  Hepatitis C blood test.  Hepatitis B blood test.  Sexually transmitted disease (STD) testing.  Diabetes screening. This is done by checking your blood sugar (glucose) after you have not eaten for a while (fasting). You may have this done every 1-3 years.  Bone density scan. This is done to screen for osteoporosis. You may have this done starting at age 54.  Mammogram. This may be done every 1-2 years. Talk to your health care provider about how often you should have regular mammograms. Talk with your health care provider about your test results, treatment options, and if necessary, the need for more tests. Vaccines  Your health care provider may recommend certain vaccines, such as:  Influenza vaccine. This is recommended every year.  Tetanus,  diphtheria, and acellular pertussis (Tdap, Td) vaccine. You may need a Td booster every 10 years.  Zoster vaccine. You may need this after age 32.  Pneumococcal 13-valent conjugate (PCV13) vaccine. One dose is  recommended after age 83.  Pneumococcal polysaccharide (PPSV23) vaccine. One dose is recommended after age 62. Talk to your health care provider about which screenings and vaccines you need and how often you need them. This information is not intended to replace advice given to you by your health care provider. Make sure you discuss any questions you have with your health care provider. Document Released: 07/14/2015 Document Revised: 03/06/2016 Document Reviewed: 04/18/2015 Elsevier Interactive Patient Education  2017 [place] Prevention in the Home Falls can cause injuries. They can happen to people of all ages. There are many things you can do to make your home safe and to help prevent falls. What can I do on the outside of my home?  Regularly fix the edges of walkways and driveways and fix any cracks.  Remove anything that might make you trip as you walk through a door, such as a raised step or threshold.  Trim any bushes or trees on the path to your home.  Use bright outdoor lighting.  Clear any walking paths of anything that might make someone trip, such as rocks or tools.  Regularly check to see if handrails are loose or broken. Make sure that both sides of any steps have handrails.  Any raised decks and porches should have guardrails on the edges.  Have any leaves, snow, or ice cleared regularly.  Use sand or salt on walking paths during winter.  Clean up any spills in your garage right away. This includes oil or grease spills. What can I do in the bathroom?  Use night lights.  Install grab bars by the toilet and in the tub and shower. Do not use towel bars as grab bars.  Use non-skid mats or decals in the tub or shower.  If you need to sit down in the shower, use a plastic, non-slip stool.  Keep the floor dry. Clean up any water that spills on the floor as soon as it happens.  Remove soap buildup in the tub or shower regularly.  Attach bath mats  securely with double-sided non-slip rug tape.  Do not have throw rugs and other things on the floor that can make you trip. What can I do in the bedroom?  Use night lights.  Make sure that you have a light by your bed that is easy to reach.  Do not use any sheets or blankets that are too big for your bed. They should not hang down onto the floor.  Have a firm chair that has side arms. You can use this for support while you get dressed.  Do not have throw rugs and other things on the floor that can make you trip. What can I do in the kitchen?  Clean up any spills right away.  Avoid walking on wet floors.  Keep items that you use a lot in easy-to-reach places.  If you need to reach something above you, use a strong step stool that has a grab bar.  Keep electrical cords out of the way.  Do not use floor polish or wax that makes floors slippery. If you must use wax, use non-skid floor wax.  Do not have throw rugs and other things on the floor that can make you trip. What can I do with  my stairs?  Do not leave any items on the stairs.  Make sure that there are handrails on both sides of the stairs and use them. Fix handrails that are broken or loose. Make sure that handrails are as long as the stairways.  Check any carpeting to make sure that it is firmly attached to the stairs. Fix any carpet that is loose or worn.  Avoid having throw rugs at the top or bottom of the stairs. If you do have throw rugs, attach them to the floor with carpet tape.  Make sure that you have a light switch at the top of the stairs and the bottom of the stairs. If you do not have them, ask someone to add them for you. What else can I do to help prevent falls?  Wear shoes that:  Do not have high heels.  Have rubber bottoms.  Are comfortable and fit you well.  Are closed at the toe. Do not wear sandals.  If you use a stepladder:  Make sure that it is fully opened. Do not climb a closed  stepladder.  Make sure that both sides of the stepladder are locked into place.  Ask someone to hold it for you, if possible.  Clearly mark and make sure that you can see:  Any grab bars or handrails.  First and last steps.  Where the edge of each step is.  Use tools that help you move around (mobility aids) if they are needed. These include:  Canes.  Walkers.  Scooters.  Crutches.  Turn on the lights when you go into a dark area. Replace any light bulbs as soon as they burn out.  Set up your furniture so you have a clear path. Avoid moving your furniture around.  If any of your floors are uneven, fix them.  If there are any pets around you, be aware of where they are.  Review your medicines with your doctor. Some medicines can make you feel dizzy. This can increase your chance of falling. Ask your doctor what other things that you can do to help prevent falls. This information is not intended to replace advice given to you by your health care provider. Make sure you discuss any questions you have with your health care provider. Document Released: 04/13/2009 Document Revised: 11/23/2015 Document Reviewed: 07/22/2014 Elsevier Interactive Patient Education  2017 [employer].

## 2019-04-19 ENCOUNTER — Other Ambulatory Visit: Payer: Self-pay

## 2019-04-19 ENCOUNTER — Encounter: Payer: Self-pay | Admitting: Family Medicine

## 2019-04-19 ENCOUNTER — Ambulatory Visit (INDEPENDENT_AMBULATORY_CARE_PROVIDER_SITE_OTHER): Payer: Medicare Other | Admitting: Family Medicine

## 2019-04-19 VITALS — BP 122/74 | HR 58 | Temp 96.8°F | Wt 165.0 lb

## 2019-04-19 DIAGNOSIS — I6381 Other cerebral infarction due to occlusion or stenosis of small artery: Secondary | ICD-10-CM

## 2019-04-19 DIAGNOSIS — I720 Aneurysm of carotid artery: Secondary | ICD-10-CM | POA: Diagnosis not present

## 2019-04-19 DIAGNOSIS — E7800 Pure hypercholesterolemia, unspecified: Secondary | ICD-10-CM | POA: Diagnosis not present

## 2019-04-19 DIAGNOSIS — Z23 Encounter for immunization: Secondary | ICD-10-CM

## 2019-04-19 DIAGNOSIS — M810 Age-related osteoporosis without current pathological fracture: Secondary | ICD-10-CM | POA: Diagnosis not present

## 2019-04-19 DIAGNOSIS — E559 Vitamin D deficiency, unspecified: Secondary | ICD-10-CM | POA: Diagnosis not present

## 2019-04-19 DIAGNOSIS — K529 Noninfective gastroenteritis and colitis, unspecified: Secondary | ICD-10-CM | POA: Diagnosis not present

## 2019-04-19 DIAGNOSIS — F0390 Unspecified dementia without behavioral disturbance: Secondary | ICD-10-CM | POA: Diagnosis not present

## 2019-04-19 DIAGNOSIS — I10 Essential (primary) hypertension: Secondary | ICD-10-CM | POA: Diagnosis not present

## 2019-04-19 LAB — CBC WITH DIFFERENTIAL/PLATELET
Basophils Absolute: 0.1 10*3/uL (ref 0.0–0.1)
Basophils Relative: 1.2 % (ref 0.0–3.0)
Eosinophils Absolute: 0.2 10*3/uL (ref 0.0–0.7)
Eosinophils Relative: 4.9 % (ref 0.0–5.0)
HCT: 40 % (ref 36.0–46.0)
Hemoglobin: 13.4 g/dL (ref 12.0–15.0)
Lymphocytes Relative: 31.4 % (ref 12.0–46.0)
Lymphs Abs: 1.5 10*3/uL (ref 0.7–4.0)
MCHC: 33.5 g/dL (ref 30.0–36.0)
MCV: 91.9 fl (ref 78.0–100.0)
Monocytes Absolute: 0.4 10*3/uL (ref 0.1–1.0)
Monocytes Relative: 8 % (ref 3.0–12.0)
Neutro Abs: 2.5 10*3/uL (ref 1.4–7.7)
Neutrophils Relative %: 54.5 % (ref 43.0–77.0)
Platelets: 184 10*3/uL (ref 150.0–400.0)
RBC: 4.35 Mil/uL (ref 3.87–5.11)
RDW: 14.2 % (ref 11.5–15.5)
WBC: 4.6 10*3/uL (ref 4.0–10.5)

## 2019-04-19 LAB — COMPREHENSIVE METABOLIC PANEL
ALT: 11 U/L (ref 0–35)
AST: 19 U/L (ref 0–37)
Albumin: 4.1 g/dL (ref 3.5–5.2)
Alkaline Phosphatase: 69 U/L (ref 39–117)
BUN: 12 mg/dL (ref 6–23)
CO2: 29 mEq/L (ref 19–32)
Calcium: 10 mg/dL (ref 8.4–10.5)
Chloride: 98 mEq/L (ref 96–112)
Creatinine, Ser: 0.98 mg/dL (ref 0.40–1.20)
GFR: 53.62 mL/min — ABNORMAL LOW (ref >60.00)
Glucose, Bld: 92 mg/dL (ref 70–99)
Potassium: 3.7 mEq/L (ref 3.5–5.1)
Sodium: 136 mEq/L (ref 135–145)
Total Bilirubin: 0.6 mg/dL (ref 0.2–1.2)
Total Protein: 6.9 g/dL (ref 6.0–8.3)

## 2019-04-19 LAB — VITAMIN D 25 HYDROXY (VIT D DEFICIENCY, FRACTURES): VITD: 75.95 ng/mL (ref 30.00–100.00)

## 2019-04-19 LAB — LIPID PANEL
Cholesterol: 231 mg/dL — ABNORMAL HIGH (ref 0–200)
HDL: 47.6 mg/dL (ref >39.00)
NonHDL: 183.8
Total CHOL/HDL Ratio: 5
Triglycerides: 263 mg/dL — ABNORMAL HIGH (ref 0.0–149.0)
VLDL: 52.6 mg/dL — ABNORMAL HIGH (ref 0.0–40.0)

## 2019-04-19 LAB — LDL CHOLESTEROL, DIRECT: Direct LDL: 127 mg/dL

## 2019-04-19 LAB — TSH: TSH: 1.64 u[IU]/mL (ref 0.35–4.50)

## 2019-04-19 MED ORDER — METOPROLOL TARTRATE 25 MG PO TABS: 25.0000 mg | ORAL_TABLET | Freq: Two times a day (BID) | ORAL | 3 refills | Status: DC

## 2019-04-19 MED ORDER — HYDROCHLOROTHIAZIDE 25 MG PO TABS: 25.0000 mg | ORAL_TABLET | Freq: Every day | ORAL | 3 refills | Status: DC

## 2019-04-19 MED ORDER — POTASSIUM CHLORIDE CRYS ER 10 MEQ PO TBCR: 20.0000 meq | EXTENDED_RELEASE_TABLET | Freq: Every day | ORAL | 3 refills | Status: DC

## 2019-04-19 MED ORDER — LOSARTAN POTASSIUM 100 MG PO TABS: 100.0000 mg | ORAL_TABLET | Freq: Every day | ORAL | 3 refills | Status: DC

## 2019-04-19 NOTE — Assessment & Plan Note (Signed)
Disc goals for lipids and reasons to control them Rev last labs with pt Rev low sat fat diet in detail Labs today  Declines treatment in light of her age  Eats what she wants

## 2019-04-19 NOTE — Patient Instructions (Addendum)
Start with melatonin for sleep  - over the counter as directed   Labs today   Flu shot today   Continue to encourage regular meals with healthy foods

## 2019-04-19 NOTE — Assessment & Plan Note (Signed)
With h/o distal radius fx Intol of miacalcin and bisphosphonate  Declines further eval or tx  Uses walker/wheelchair and has atc care from family to prevent falls (no falls since summer) D level today She takes ca and D

## 2019-04-19 NOTE — Assessment & Plan Note (Signed)
bp in fair control at this time  BP Readings from Last 1 Encounters:  04/19/19 122/74   No changes needed Most recent labs reviewed  Disc lifstyle change with low sodium diet and exercise

## 2019-04-19 NOTE — Assessment & Plan Note (Signed)
No clinical changes 

## 2019-04-19 NOTE — Assessment & Plan Note (Signed)
In setting of OP  Level today Disc imp to bone and overall health

## 2019-04-19 NOTE — Progress Notes (Signed)
Subjective:    Patient ID: Wendy Krueger, female    DOB: [birth year], 85 y.o.   MRN: [id number]  HPI Here for annual f/u of chronic health problems   Staying home during the pandemic   Had amw on 10/15 Pt declined mammogram due to age  (of note daughter had breast cancer)  Also does not want to go out during pandemic  Does not do self exams/no suspected lumps (family who bathes her has not noticed)  Declined Tdap and shingrix due to cost  Flu shot-given today (wants low dose/did not tolerate hi dose)  Weight- in wheel chair  Wt Readings from Last 3 Encounters:  04/19/19 165 lb (74.8 kg)  12/31/18 163 lb 6 oz (74.1 kg)  09/08/18 166 lb 1 oz (75.3 kg)   Stable overall   dexa 11/10 Osteoporosis  Intol of miacalcin and bisphosphonate Pt declines further eval or tx  Watching D level - is taking D  Falls -none since July    Fractures -distal radius 8/19    Hypertension (in setting of past cva)  bp is stable today  No cp or palpitations or headaches or edema  No side effects to medicines  BP Readings from Last 3 Encounters:  04/19/19 122/74  01/20/19 134/78  01/18/19 122/82      Dementia/ age related  MS worsens after falls/ illnesses  On namenda and aricept  Late in evening- sundowning- starts to talk about deceased family members Wakes up frequently at night -will occasionally get up (not every night) Has care around the clock - family  Has an alarm on her bed    No diarrhea  Had hemorrhoids -occ scant blood on bm  Using a probiotic with fiber- that is helping  Drinking water - now trying some lemonade flavor -helps get more fluids in    Hyperlipidemia Declines medication for this at her age Due for labs Lab Results  Component Value Date   CHOL 287 (H) 04/13/2018   HDL 59.50 04/13/2018   LDLCALC 190 (H) 04/13/2018   LDLDIRECT 215.0 04/13/2018   TRIG 187.0 (H) 04/13/2018   CHOLHDL 5 04/13/2018   Intolerant of zocor in past   Does not eat large  amounts Family fixes a plate-she eats what is on it  occ ice cream   Patient Active Problem List   Diagnosis Date Noted  . Transient alteration of awareness 07/14/2018  . Chronic diarrhea 02/17/2018  . History of fracture of radius 02/17/2018  . Hypokalemia 02/09/2018  . History of lower GI bleeding 02/09/2018  . Cerebellar stroke ([place]) 03/10/2016  . Carotid aneurysm, right ([place]) 02/21/2016  . Lipoma of shoulder 03/27/2015  . History of closed Colles' fracture 09/06/2014  . Allergic rhinitis 06/20/2014  . Diastolic dysfunction [id number]  . History of GI bleed 03/21/2014  . BRBPR (bright red blood per rectum) 03/19/2014  . Dementia ([place]) 09/01/2013  . Dementia, senile ([place]) 09/01/2013  . Anxiety 09/01/2013  . Encounter for Medicare annual wellness exam 01/08/2013  . Gallstones 11/11/2011  . 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 Risk for falls 06/04/2011  . Constipation 03/05/2011  . Vitamin D deficiency 03/28/2009  . HYPERCHOLESTEROLEMIA, PURE 03/17/2007  . Essential hypertension 03/16/2007  . OSTEOARTHRITIS 03/16/2007  . Osteoporosis 03/16/2007   Past Medical History:  Diagnosis Date  . Asymptomatic gallstones   . BRBPR (bright red blood per rectum)   . Cataract 04/13/2018   bilateral eyes  . Cholelithiasis   . Colitis - presumed  infectious origin    one ER visit   . Dementia ([place])   . Duodenitis   . Fall   . Gastritis   . GI bleed   . Hiatal hernia   . Hyperlipidemia   . Hypertension   . Osteoarthritis   . Osteoporosis   . Stroke ([place])   . Syncope    Past Surgical History:  Procedure Laterality Date  . ABDOMINAL HYSTERECTOMY     BSO- fibroids  . ABI's     normal  . dexa  1/05   osteoporosis  . ESOPHAGOGASTRODUODENOSCOPY N/A 03/21/2014   Procedure: ESOPHAGOGASTRODUODENOSCOPY (EGD);  Surgeon: Milus Banister, MD;  Location: [facility];  Service: Endoscopy;  Laterality: N/A;  . left foot brace    . WRIST FRACTURE SURGERY  2/01   R arm  .  WRIST FRACTURE SURGERY Left 02/12/2018   Social History   Tobacco Use  . Smoking status: Never Smoker  . Smokeless tobacco: Never Used  Substance Use Topics  . Alcohol use: No    Alcohol/week: 0.0 standard drinks  . Drug use: No   Family History  Problem Relation Age of Onset  . Heart attack Father   . Hypertension Father   . Heart attack Mother   . Throat cancer Brother   . Breast cancer Daughter    Allergies  Allergen Reactions  . Alendronate Sodium Other (See Comments)    Leg pain   . Amlodipine Besylate Hives  . Calcitonin ([place]) Other (See Comments)     headache/ head pressure  . Plavix [Clopidogrel Bisulfate]     drowsy    . Simvastatin Other (See Comments)    Leg pain    Current Outpatient Medications on File Prior to Visit  Medication Sig Dispense Refill  . acidophilus (RISAQUAD) CAPS capsule Take 1 capsule by mouth daily.    . Cholecalciferol (VITAMIN D-3) 1000 units CAPS Take 1 capsule See admin instructions by mouth. Take 1000 units in the morning and 2000 in the evening    . donepezil (ARICEPT) 10 MG tablet TAKE 1 TABLET (10 MG TOTAL) BY MOUTH AT BEDTIME. 30 tablet 11  . Fexofenadine HCl (ALLERGY 24-HR PO) Take 1 capsule by mouth daily.    . hydrocortisone (ANUSOL-HC) 2.5 % rectal cream Place 1 application rectally 2 (two) times daily as needed for hemorrhoids or anal itching. 30 g 0  . memantine (NAMENDA) 10 MG tablet TAKE 1 TABLET (10 MG TOTAL) BY MOUTH 2 (TWO) TIMES DAILY. 180 tablet 4  . Multiple Vitamins-Minerals ([employer]S SPECTRAVITE PO) Take 1 capsule by mouth daily.    . Probiotic Product (PROBIOTIC PO) Take 1 tablet by mouth daily.    . vitamin B-12 (CYANOCOBALAMIN) 1000 MCG tablet Take 1,000 mcg by mouth daily.     No current facility-administered medications on file prior to visit.     Review of Systems  Constitutional: Negative for activity change, appetite change, fatigue, fever and unexpected weight change.  HENT: Negative for congestion, ear  pain, rhinorrhea, sinus pressure and sore throat.   Eyes: Negative for pain, redness and visual disturbance.  Respiratory: Negative for cough, shortness of breath and wheezing.   Cardiovascular: Negative for chest pain and palpitations.  Gastrointestinal: Negative for abdominal pain, blood in stool, constipation and diarrhea.  Endocrine: Negative for polydipsia and polyuria.  Genitourinary: Negative for dysuria, frequency and urgency.  Musculoskeletal: Positive for arthralgias. Negative for back pain and myalgias.  Skin: Negative for pallor and rash.  Allergic/Immunologic: Negative for  environmental allergies.  Neurological: Negative for dizziness, syncope and headaches.  Hematological: Negative for adenopathy. Does not bruise/bleed easily.  Psychiatric/Behavioral: Positive for agitation and sleep disturbance. Negative for decreased concentration, dysphoric mood and hallucinations. The patient is not nervous/anxious.        Worsening dementia       Objective:   Physical Exam Constitutional:      General: She is not in acute distress.    Appearance: Normal appearance. She is well-developed. She is obese. She is not ill-appearing or diaphoretic.  HENT:     Head: Normocephalic and atraumatic.     Right Ear: Tympanic membrane, ear canal and external ear normal.     Left Ear: Tympanic membrane, ear canal and external ear normal.     Nose: Nose normal. No congestion.     Mouth/Throat:     Mouth: Mucous membranes are moist.     Pharynx: Oropharynx is clear. No posterior oropharyngeal erythema.  Eyes:     General: No scleral icterus.    Extraocular Movements: Extraocular movements intact.     Conjunctiva/sclera: Conjunctivae normal.     Pupils: Pupils are equal, round, and reactive to light.  Neck:     Musculoskeletal: Normal range of motion and neck supple. No neck rigidity or muscular tenderness.     Thyroid: No thyromegaly.     Vascular: No carotid bruit or JVD.  Cardiovascular:      Rate and Rhythm: Normal rate and regular rhythm.     Pulses: Normal pulses.     Heart sounds: Normal heart sounds. No gallop.   Pulmonary:     Effort: Pulmonary effort is normal. No respiratory distress.     Breath sounds: Normal breath sounds. No wheezing.     Comments: Good air exch Chest:     Chest wall: No tenderness.  Abdominal:     General: Bowel sounds are normal. There is no distension or abdominal bruit.     Palpations: Abdomen is soft. There is no mass.     Tenderness: There is no abdominal tenderness.     Hernia: No hernia is present.  Genitourinary:    Comments: Breast exam not done today Pt in wheelchair Musculoskeletal: Normal range of motion.        General: No tenderness.     Right lower leg: No edema.     Left lower leg: No edema.  Lymphadenopathy:     Cervical: No cervical adenopathy.  Skin:    General: Skin is warm and dry.     Coloration: Skin is not pale.     Findings: No erythema or rash.     Comments: sks diffusely  Some angiomas  Neurological:     Mental Status: She is alert. Mental status is at baseline.     Cranial Nerves: No cranial nerve deficit.     Motor: No abnormal muscle tone.     Coordination: Coordination normal.     Gait: Gait normal.     Deep Tendon Reflexes: Reflexes are normal and symmetric. Reflexes normal.  Psychiatric:        Mood and Affect: Mood normal.        Cognition and Memory: Cognition is impaired. Memory is impaired.           Assessment & Plan:   Problem List Items Addressed This Visit      Cardiovascular and Mediastinum   Essential hypertension - Primary    bp in fair control at this time  BP  Readings from Last 1 Encounters:  04/19/19 122/74   No changes needed Most recent labs reviewed  Disc lifstyle change with low sodium diet and exercise        Relevant Medications   hydrochlorothiazide (HYDRODIURIL) 25 MG tablet   losartan (COZAAR) 100 MG tablet   metoprolol tartrate (LOPRESSOR) 25 MG tablet    Other Relevant Orders   CBC with Differential/Platelet   Comprehensive metabolic panel   Lipid panel   TSH   Carotid aneurysm, right (HCC)    No clinical changes      Relevant Medications   hydrochlorothiazide (HYDRODIURIL) 25 MG tablet   losartan (COZAAR) 100 MG tablet   metoprolol tartrate (LOPRESSOR) 25 MG tablet     Digestive   Chronic diarrhea    Much improved with probiotic        Nervous and Auditory   Dementia, senile ([place])    This continues to progress Continues aricept and namenda  Disc use of melatonin to help sleep  Using bed alarm and has atc care by family  No accidents          Musculoskeletal and Integument   Osteoporosis    With h/o distal radius fx Intol of miacalcin and bisphosphonate  Declines further eval or tx  Uses walker/wheelchair and has atc care from family to prevent falls (no falls since summer) D level today She takes ca and D        Other   Vitamin D deficiency    In setting of OP  Level today Disc imp to bone and overall health      Relevant Orders   VITAMIN D 25 Hydroxy (Vit-D Deficiency, Fractures)   HYPERCHOLESTEROLEMIA, PURE    Disc goals for lipids and reasons to control them Rev last labs with pt Rev low sat fat diet in detail Labs today  Declines treatment in light of her age  Eats what she wants      Relevant Medications   hydrochlorothiazide (HYDRODIURIL) 25 MG tablet   losartan (COZAAR) 100 MG tablet   metoprolol tartrate (LOPRESSOR) 25 MG tablet   Other Relevant Orders   Lipid panel    Other Visit Diagnoses    Need for influenza vaccination       Relevant Orders   Flu Vaccine QUAD 6+ mos PF IM (Fluarix Quad PF) (Completed)

## 2019-04-19 NOTE — Assessment & Plan Note (Signed)
This continues to progress Continues aricept and namenda  Disc use of melatonin to help sleep  Using bed alarm and has atc care by family  No accidents

## 2019-04-19 NOTE — Assessment & Plan Note (Signed)
Much improved with probiotic

## 2019-04-20 ENCOUNTER — Encounter: Payer: Self-pay | Admitting: *Deleted

## 2019-04-26 ENCOUNTER — Other Ambulatory Visit: Payer: Self-pay | Admitting: *Deleted

## 2019-05-05 NOTE — Patient Outreach (Signed)
[facility]) Care Management  [facility] Care Manager  [id number] Late entry  SHATA GALAS 08/26/1929 [id number]  RN Health Coach telephone call to patient daughter [name].  [employer] compliance verified. Patient has senile dementia. Patient is able to get around with a walker inside and uses a transport chair when she goes out. Patient does have a history of falls. Patient is able to feed self. Patient needs assistance with ADL. Patient last fall was 01/18/2019. Per daughter she does take the patient blood pressure twice a week. Daughter has agreed to follow up outreach calls.    Encounter Medications:  Outpatient Encounter Medications as of 04/26/2019  Medication Sig  . acidophilus (RISAQUAD) CAPS capsule Take 1 capsule by mouth daily.  . Cholecalciferol (VITAMIN D-3) 1000 units CAPS Take 1 capsule See admin instructions by mouth. Take 1000 units in the morning and 2000 in the evening  . donepezil (ARICEPT) 10 MG tablet TAKE 1 TABLET (10 MG TOTAL) BY MOUTH AT BEDTIME.  Marland Kitchen Fexofenadine HCl (ALLERGY 24-HR PO) Take 1 capsule by mouth daily.  . hydrochlorothiazide (HYDRODIURIL) 25 MG tablet Take 1 tablet (25 mg total) by mouth daily.  . hydrocortisone (ANUSOL-HC) 2.5 % rectal cream Place 1 application rectally 2 (two) times daily as needed for hemorrhoids or anal itching.  . losartan (COZAAR) 100 MG tablet Take 1 tablet (100 mg total) by mouth daily.  . memantine (NAMENDA) 10 MG tablet TAKE 1 TABLET (10 MG TOTAL) BY MOUTH 2 (TWO) TIMES DAILY.  . metoprolol tartrate (LOPRESSOR) 25 MG tablet Take 1 tablet (25 mg total) by mouth 2 (two) times daily.  . Multiple Vitamins-Minerals (CVS SPECTRAVITE PO) Take 1 capsule by mouth daily.  . potassium chloride (KLOR-CON M10) 10 MEQ tablet Take 2 tablets (20 mEq total) by mouth daily.  . Probiotic Product (PROBIOTIC PO) Take 1 tablet by mouth daily.  . vitamin B-12 (CYANOCOBALAMIN) 1000 MCG tablet Take 1,000 mcg by mouth daily.   No  facility-administered encounter medications on file as of 04/26/2019.     Functional Status:  In your present state of health, do you have any difficulty performing the following activities: 04/26/2019 04/15/2019  Hearing? N N  Vision? Y Y  Comment cataracts patient has cataracts  Difficulty concentrating or making decisions? Y Y  Comment dementia patient is confused all the time, has dementia  Walking or climbing stairs? Tempie Donning  Comment uses walker inside of house -  Dressing or bathing? Y Y  Comment Daughter assists -  Doing errands, shopping? Y Y  Comment Daughter runs all errands -  Conservation officer, nature and eating ? Tempie Donning  Comment daughter prepares food -  Using the Toilet? Tempie Donning  Comment needs assistance -  In the past six months, have you accidently leaked urine? Y Y  Comment patient wears adult briefs wears adult diapers  Do you have problems with loss of bowel control? Y Y  Comment - wears adult diapers  Managing your Medications? Tempie Donning  Comment Daughter prepares medications -  Managing your Finances? Y Y  Comment dtr handles all finances -  Housekeeping or managing your Housekeeping? Y Y  Some recent data might be hidden    Fall/Depression Screening: Fall Risk  04/26/2019 04/15/2019 07/29/2018  Falls in the past year? 1 1 1   Comment - - -  Number falls in past yr: 0 1 0  Comment - - -  Injury with Fall? 0 0 1  Comment - - wrist fx  Risk Factor Category  - - -  Risk for fall due to : Impaired balance/gait;History of fall(s);Impaired mobility History of fall(s);Impaired balance/gait;Impaired mobility;Medication side effect -  Follow up Falls evaluation completed;Falls prevention discussed;Education provided Falls evaluation completed;Falls prevention discussed -   PHQ 2/9 Scores 04/26/2019 04/15/2019 04/13/2018 04/09/2017 02/21/2017 01/03/2016 01/03/2016  PHQ - 2 Score 0 0 0 0 2 0 0  PHQ- 9 Score - 0 0 0 10 - -   THN CM Care Plan Problem One     Most Recent Value  Care Plan  Problem One  Knowledge deficit in self management of hypertension  Care Plan for Problem One  Active  THN Long Term Goal   Patient daughter will have a bertter understanding of foods high and low in sodium within the next 90 days  THN Long Term Goal Start Date  04/26/19  Interventions for Problem One Long Term Goal  RN discussed low sodium foods. RN sent a picture sheet of foods high and low in sodium. RN will follow up with further discussion  THN CM Short Term Goal #1   Patient will not have any falls within the next 30 days  THN CM Short Term Goal #1 Start Date  04/26/19  Interventions for Short Term Goal #1  RN discussed fall prevention. RN sent educational material on fall prevention and how to get up from a fall. RN will follow upwith further discussion  THN CM Short Term Goal #2   Patien daughter will assist patient in following up with health maintenance within the next 30 days  THN CM Short Term Goal #2 Start Date  04/26/19  Interventions for Short Term Goal #2  RN discussed health maintenance. Patient has gotten flu shot 04/19/2019. RN discussed following up on tetanus, shingles and eye exam. RN will follow up with further discussion      Assessment:  Patient is taking medications with assistance from daughter Patient is able to feed self Daughter checks blood pressure twice a week Patient and daughter will benefit from [place] telephonic outreach for education and support for hypertension self management.  Plan:  RN discussed advance directive RN discussed monitoring blood pressure at home RN discussed fall prevention RN discussed medication adherence RN sent A matter of choice blood pressure control booklet RN sent EMMI fall prevention and how to get up from a fall RN sent barriers letter and assessment to PCP RN sent boost coupons RN sent picture sheet of foods high and low in sodium RN will follow up within the month of January  Alyus Mofield [facility] [phone]

## 2019-05-17 ENCOUNTER — Other Ambulatory Visit: Payer: Self-pay | Admitting: Neurology

## 2019-05-30 ENCOUNTER — Other Ambulatory Visit: Payer: Self-pay | Admitting: Neurology

## 2019-05-30 DIAGNOSIS — I6381 Other cerebral infarction due to occlusion or stenosis of small artery: Secondary | ICD-10-CM

## 2019-06-03 ENCOUNTER — Other Ambulatory Visit: Payer: Self-pay | Admitting: Neurology

## 2019-06-21 ENCOUNTER — Other Ambulatory Visit: Payer: Self-pay | Admitting: Neurology

## 2019-06-21 ENCOUNTER — Telehealth: Payer: Self-pay

## 2019-06-21 MED ORDER — HYDROCORTISONE (PERIANAL) 2.5 % EX CREA: 1.0000 "application " | TOPICAL_CREAM | Freq: Two times a day (BID) | CUTANEOUS | 1 refills | Status: DC | PRN

## 2019-06-21 NOTE — Telephone Encounter (Signed)
She has a history of intermittent chronic diarrhea  Is this worse than her usual Soft or watery stool?  Any blood (she has hemorrhoid issues) Is she still taking her probiotic? (it has helped in the past)   Abdominal pain or nausea or vomiting?  Thanks

## 2019-06-21 NOTE — Telephone Encounter (Signed)
This prescription was sent to this pharmacy in Aug 2020. #90 day supply with refills to least a year. I called [facility] and spoke with Abigail Butts. She was able to locate the previous refill. No further action needed at this time.

## 2019-06-21 NOTE — Telephone Encounter (Signed)
Med refilled. Daughter notified of Dr. Marliss Coots comments and instructions and verbalized understanding

## 2019-06-21 NOTE — Telephone Encounter (Signed)
Spoke with daughter [name] and she said that the frequency of how often she is having the diarrhea is worsening. pt might have 1-2 days of normal BM's and then it's right back to diarrhea. She said her stool is dark but they having seen any visible blood in her stool but since she has a "massive hemorrhoid" sometimes it does bleed due to the amount of times they are having to wipe her. Daughter said the stool has been both soft and watery at times. Still doing probiotic and they are feeding her a very bland diet and sticking to a BRAT diet most of the times but it's not helping at all. No abd pain no nausea or vomiting, just uncontrollable diarrhea. Daughter said it's to the point sometimes she just moves and it comes out and she has lost all control of her bowels. Daughter is afraid she has picked up a "bug" or something and wants to know what Dr. Glori Bickers suggest. Also pt is out of hemorrhoid cream would like Dr. Glori Bickers to refill med if possible.     [facility]

## 2019-06-21 NOTE — Telephone Encounter (Signed)
Aricept can cause diarrhea as a side effect.  Please hold it for a week and tell me if this improves at all Please refill hemorrhoid medicine times one Continue probiotic  Watch for fever/abd pain and keep me posted

## 2019-06-21 NOTE — Telephone Encounter (Signed)
Wendy Krueger called and left a message on Triage VM saying the pt has been having intermittent diarrhea for several weeks. Asking if she should be concerned or should she do anything. I tried to call back, but line was busy.

## 2019-06-29 ENCOUNTER — Telehealth: Payer: Self-pay | Admitting: *Deleted

## 2019-06-29 ENCOUNTER — Ambulatory Visit: Payer: Medicare Other | Attending: Internal Medicine

## 2019-06-29 DIAGNOSIS — Z20822 Contact with and (suspected) exposure to covid-19: Secondary | ICD-10-CM

## 2019-06-29 NOTE — Telephone Encounter (Signed)
Left VM letting [name] know Dr. Marliss Coots comments and instructions

## 2019-06-29 NOTE — Telephone Encounter (Signed)
Patient's daughter [name] called stating that she called last week because her mom was having diarrhea. [name] stated that she was advised to stop her mom's Aricept because that can cause diarrhea. [name] stated that her mom is still having loose stools and has already had three bowel movements today. [name] stated that her mom today started complaining of lower abdominal pain. [name] stated that her mom has not had a fever. [name] stated that her mom was exposed to covid by her son on 06/08/19.  [name] stated that her brother was in the house not wearing a mask, but does not think that he went into her mom's room. [name] stated that her mom is not eating good for them and does not seem to have an appetite. [name] stated that her mom's hemorrhoids have been acting up and bleeding some, so she is using hemorrhoidal cream and Vaseline on her rectum.   [name] stated that she has been giving her mom Pepto and wants to make sure that is okay? [name] stated that she has been giveng her some V-8 Hydrate drink hoping that she will not get dehydrated.  After speaking to Dr. Donnel Saxon was advised that she needs to take her mom to get tested for covid since she is having diarrhea and exposure. Information given to [place] regarding testing site information. [name] stated that she will make arrangements to get her mom tested and will keep Dr. Glori Bickers informed.

## 2019-06-29 NOTE — Telephone Encounter (Signed)
Wendy Krueger left v/m that pt was tested for covid 06/29/19 at [facility]. FYI to Dr Glori Bickers.

## 2019-06-29 NOTE — Telephone Encounter (Signed)
Pepto is ok as long as it does not make her abd pain worse    please do try to hydrate her with whatever she will drink  (some juices can make stool loser so caution with the V8)

## 2019-06-29 NOTE — Telephone Encounter (Signed)
Called daughter but she wasn't there she was taking pt to get covid test, will call back later

## 2019-06-29 NOTE — Telephone Encounter (Signed)
Thanks for letting me know  Will wait on result

## 2019-06-30 LAB — NOVEL CORONAVIRUS, NAA: SARS-CoV-2, NAA: NOT DETECTED

## 2019-07-02 HISTORY — PX: IVC FILTER INSERTION: CATH118245

## 2019-07-05 ENCOUNTER — Other Ambulatory Visit: Payer: Self-pay

## 2019-07-05 ENCOUNTER — Inpatient Hospital Stay (HOSPITAL_COMMUNITY)
Admission: EM | Admit: 2019-07-05 | Discharge: 2019-07-12 | DRG: 377 | Disposition: A | Discharge status: Home Health | Payer: Medicare Other | Attending: Internal Medicine | Admitting: Internal Medicine

## 2019-07-05 ENCOUNTER — Telehealth: Payer: Self-pay

## 2019-07-05 ENCOUNTER — Emergency Department (HOSPITAL_COMMUNITY): Payer: Medicare Other

## 2019-07-05 DIAGNOSIS — K921 Melena: Secondary | ICD-10-CM | POA: Diagnosis not present

## 2019-07-05 DIAGNOSIS — R1032 Left lower quadrant pain: Secondary | ICD-10-CM | POA: Diagnosis not present

## 2019-07-05 DIAGNOSIS — I82411 Acute embolism and thrombosis of right femoral vein: Secondary | ICD-10-CM | POA: Diagnosis not present

## 2019-07-05 DIAGNOSIS — I82441 Acute embolism and thrombosis of right tibial vein: Secondary | ICD-10-CM | POA: Diagnosis not present

## 2019-07-05 DIAGNOSIS — F0150 Vascular dementia without behavioral disturbance: Secondary | ICD-10-CM | POA: Diagnosis not present

## 2019-07-05 DIAGNOSIS — Z8673 Personal history of transient ischemic attack (TIA), and cerebral infarction without residual deficits: Secondary | ICD-10-CM

## 2019-07-05 DIAGNOSIS — I82409 Acute embolism and thrombosis of unspecified deep veins of unspecified lower extremity: Secondary | ICD-10-CM

## 2019-07-05 DIAGNOSIS — E875 Hyperkalemia: Secondary | ICD-10-CM | POA: Diagnosis present

## 2019-07-05 DIAGNOSIS — G9340 Encephalopathy, unspecified: Secondary | ICD-10-CM | POA: Diagnosis not present

## 2019-07-05 DIAGNOSIS — F0390 Unspecified dementia without behavioral disturbance: Secondary | ICD-10-CM | POA: Diagnosis present

## 2019-07-05 DIAGNOSIS — I82431 Acute embolism and thrombosis of right popliteal vein: Secondary | ICD-10-CM | POA: Diagnosis not present

## 2019-07-05 DIAGNOSIS — I129 Hypertensive chronic kidney disease with stage 1 through stage 4 chronic kidney disease, or unspecified chronic kidney disease: Secondary | ICD-10-CM | POA: Diagnosis present

## 2019-07-05 DIAGNOSIS — K5909 Other constipation: Secondary | ICD-10-CM | POA: Diagnosis present

## 2019-07-05 DIAGNOSIS — K644 Residual hemorrhoidal skin tags: Secondary | ICD-10-CM

## 2019-07-05 DIAGNOSIS — I82451 Acute embolism and thrombosis of right peroneal vein: Secondary | ICD-10-CM | POA: Diagnosis present

## 2019-07-05 DIAGNOSIS — Z20822 Contact with and (suspected) exposure to covid-19: Secondary | ICD-10-CM | POA: Diagnosis present

## 2019-07-05 DIAGNOSIS — K5731 Diverticulosis of large intestine without perforation or abscess with bleeding: Principal | ICD-10-CM | POA: Diagnosis present

## 2019-07-05 DIAGNOSIS — K5790 Diverticulosis of intestine, part unspecified, without perforation or abscess without bleeding: Secondary | ICD-10-CM | POA: Diagnosis not present

## 2019-07-05 DIAGNOSIS — G9341 Metabolic encephalopathy: Secondary | ICD-10-CM | POA: Diagnosis not present

## 2019-07-05 DIAGNOSIS — K922 Gastrointestinal hemorrhage, unspecified: Secondary | ICD-10-CM | POA: Diagnosis present

## 2019-07-05 DIAGNOSIS — K8020 Calculus of gallbladder without cholecystitis without obstruction: Secondary | ICD-10-CM | POA: Diagnosis not present

## 2019-07-05 DIAGNOSIS — R935 Abnormal findings on diagnostic imaging of other abdominal regions, including retroperitoneum: Secondary | ICD-10-CM

## 2019-07-05 DIAGNOSIS — D649 Anemia, unspecified: Secondary | ICD-10-CM | POA: Diagnosis present

## 2019-07-05 DIAGNOSIS — K625 Hemorrhage of anus and rectum: Secondary | ICD-10-CM | POA: Diagnosis present

## 2019-07-05 DIAGNOSIS — Z66 Do not resuscitate: Secondary | ICD-10-CM | POA: Diagnosis present

## 2019-07-05 DIAGNOSIS — Z79899 Other long term (current) drug therapy: Secondary | ICD-10-CM

## 2019-07-05 DIAGNOSIS — N183 Chronic kidney disease, stage 3 unspecified: Secondary | ICD-10-CM | POA: Diagnosis present

## 2019-07-05 DIAGNOSIS — I10 Essential (primary) hypertension: Secondary | ICD-10-CM | POA: Diagnosis present

## 2019-07-05 DIAGNOSIS — Z90710 Acquired absence of both cervix and uterus: Secondary | ICD-10-CM

## 2019-07-05 DIAGNOSIS — M810 Age-related osteoporosis without current pathological fracture: Secondary | ICD-10-CM | POA: Diagnosis present

## 2019-07-05 DIAGNOSIS — K648 Other hemorrhoids: Secondary | ICD-10-CM | POA: Diagnosis present

## 2019-07-05 DIAGNOSIS — E876 Hypokalemia: Secondary | ICD-10-CM | POA: Diagnosis present

## 2019-07-05 LAB — COMPREHENSIVE METABOLIC PANEL
ALT: 12 U/L (ref 0–44)
AST: 25 U/L (ref 15–41)
Albumin: 3.4 g/dL — ABNORMAL LOW (ref 3.5–5.0)
Alkaline Phosphatase: 67 U/L (ref 38–126)
Anion gap: 12 (ref 5–15)
BUN: 15 mg/dL (ref 8–23)
CO2: 24 mmol/L (ref 22–32)
Calcium: 10.2 mg/dL (ref 8.9–10.3)
Chloride: 104 mmol/L (ref 98–111)
Creatinine, Ser: 1.28 mg/dL — ABNORMAL HIGH (ref 0.44–1.00)
GFR calc Af Amer: 44 mL/min — ABNORMAL LOW (ref >60)
GFR calc non Af Amer: 38 mL/min — ABNORMAL LOW (ref >60)
Glucose, Bld: 118 mg/dL — ABNORMAL HIGH (ref 70–99)
Potassium: 3.7 mmol/L (ref 3.5–5.1)
Sodium: 140 mmol/L (ref 135–145)
Total Bilirubin: 0.5 mg/dL (ref 0.3–1.2)
Total Protein: 6.7 g/dL (ref 6.5–8.1)

## 2019-07-05 LAB — TYPE AND SCREEN
ABO/RH(D): O POS
Antibody Screen: NEGATIVE

## 2019-07-05 LAB — CBC
HCT: 41.4 % (ref 36.0–46.0)
Hemoglobin: 12.9 g/dL (ref 12.0–15.0)
MCH: 30.6 pg (ref 26.0–34.0)
MCHC: 31.2 g/dL (ref 30.0–36.0)
MCV: 98.3 fL (ref 80.0–100.0)
Platelets: 212 10*3/uL (ref 150–400)
RBC: 4.21 MIL/uL (ref 3.87–5.11)
RDW: 12.6 % (ref 11.5–15.5)
WBC: 6.1 10*3/uL (ref 4.0–10.5)
nRBC: 0 % (ref 0.0–0.2)

## 2019-07-05 LAB — PROTIME-INR
INR: 1.1 (ref 0.8–1.2)
Prothrombin Time: 14.2 seconds (ref 11.4–15.2)

## 2019-07-05 LAB — POC OCCULT BLOOD, ED: Fecal Occult Bld: POSITIVE — AB

## 2019-07-05 MED ORDER — ONDANSETRON HCL 4 MG/2ML IJ SOLN: 4.0000 mg | Freq: Four times a day (QID) | INTRAMUSCULAR | Status: DC | PRN

## 2019-07-05 MED ORDER — ACETAMINOPHEN 650 MG RE SUPP: 650.0000 mg | Freq: Four times a day (QID) | RECTAL | Status: DC | PRN

## 2019-07-05 MED ORDER — SODIUM CHLORIDE 0.9 % IV SOLN
8.0000 mg/h | INTRAVENOUS | Status: DC
  Administered 2019-07-06 (×2): 8 mg/h via INTRAVENOUS
  Filled 2019-07-05 (×3): qty 80

## 2019-07-05 MED ORDER — ONDANSETRON HCL 4 MG PO TABS: 4.0000 mg | ORAL_TABLET | Freq: Four times a day (QID) | ORAL | Status: DC | PRN

## 2019-07-05 MED ORDER — LACTATED RINGERS IV BOLUS
1000.0000 mL | Freq: Once | INTRAVENOUS | Status: AC
  Administered 2019-07-05: 18:00:00 1000 mL via INTRAVENOUS

## 2019-07-05 MED ORDER — SODIUM CHLORIDE 0.9 % IV SOLN
80.0000 mg | Freq: Once | INTRAVENOUS | Status: AC
  Administered 2019-07-06: 80 mg via INTRAVENOUS
  Filled 2019-07-05: qty 80

## 2019-07-05 MED ORDER — PANTOPRAZOLE SODIUM 40 MG IV SOLR: 40.0000 mg | Freq: Two times a day (BID) | INTRAVENOUS | Status: DC

## 2019-07-05 MED ORDER — IOHEXOL 300 MG/ML  SOLN
80.0000 mL | Freq: Once | INTRAMUSCULAR | Status: AC | PRN
  Administered 2019-07-05: 20:00:00 80 mL via INTRAVENOUS

## 2019-07-05 MED ORDER — ENOXAPARIN SODIUM 80 MG/0.8ML ~~LOC~~ SOLN
75.0000 mg | SUBCUTANEOUS | Status: DC
  Filled 2019-07-05: qty 0.75

## 2019-07-05 MED ORDER — ACETAMINOPHEN 325 MG PO TABS
650.0000 mg | ORAL_TABLET | Freq: Four times a day (QID) | ORAL | Status: DC | PRN
  Administered 2019-07-08 – 2019-07-09 (×2): 650 mg via ORAL
  Filled 2019-07-05 (×2): qty 2

## 2019-07-05 NOTE — ED Provider Notes (Signed)
[facility] EMERGENCY DEPARTMENT Provider Note   CSN: [id number] Arrival date & time: 07/05/19  1126     History No chief complaint on file.   Wendy Krueger is a 86 y.o. female.  HPI Wendy Krueger is a 86 y.o. female with a medical history of hemorrhoids, dementia, hyperlipidemia who presents to the ED for rectal bleeding and diarrhea.  Daughter states that she has had intermittent diarrhea for the past month.  Daughter reports sporadic rectal bleeding, states is bright red blood and seems to be from external hemorrhoids.  She reports diarrhea is nonbloody.  She has not had any recent antibiotics.  She does not take blood thinner.  She denies fever, chest pain, abdominal pain, shortness of breath, vomiting.      Past Medical History:  Diagnosis Date  . Asymptomatic gallstones   . BRBPR (bright red blood per rectum)   . Cataract 04/13/2018   bilateral eyes  . Cholelithiasis   . Colitis - presumed infectious origin    one ER visit   . Dementia ([place])   . Duodenitis   . Fall   . Gastritis   . GI bleed   . Hiatal hernia   . Hyperlipidemia   . Hypertension   . Osteoarthritis   . Osteoporosis   . Stroke ([place])   . Syncope     Patient Active Problem List   Diagnosis Date Noted  . Melena 07/05/2019  . Transient alteration of awareness 07/14/2018  . Chronic diarrhea 02/17/2018  . History of fracture of radius 02/17/2018  . Hypokalemia 02/09/2018  . History of lower GI bleeding 02/09/2018  . Acute encephalopathy 05/08/2017  . Cerebellar stroke ([place]) 03/10/2016  . Carotid aneurysm, right ([place]) 02/21/2016  . Lipoma of shoulder 03/27/2015  . History of closed Colles' fracture 09/06/2014  . Allergic rhinitis 06/20/2014  . Diastolic dysfunction [id number]  . History of GI bleed 03/21/2014  . BRBPR (bright red blood per rectum) 03/19/2014  . Dementia ([place]) 09/01/2013  . Dementia, senile ([place]) 09/01/2013  . Anxiety 09/01/2013  . Encounter for  Medicare annual wellness exam 01/08/2013  . Gallstones 11/11/2011  . 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 Risk for falls 06/04/2011  . Constipation 03/05/2011  . Vitamin D deficiency 03/28/2009  . HYPERCHOLESTEROLEMIA, PURE 03/17/2007  . Essential hypertension 03/16/2007  . OSTEOARTHRITIS 03/16/2007  . Osteoporosis 03/16/2007    Past Surgical History:  Procedure Laterality Date  . ABDOMINAL HYSTERECTOMY     BSO- fibroids  . ABI's     normal  . dexa  1/05   osteoporosis  . ESOPHAGOGASTRODUODENOSCOPY N/A 03/21/2014   Procedure: ESOPHAGOGASTRODUODENOSCOPY (EGD);  Surgeon: Milus Banister, MD;  Location: [facility];  Service: Endoscopy;  Laterality: N/A;  . left foot brace    . WRIST FRACTURE SURGERY  2/01   R arm  . WRIST FRACTURE SURGERY Left 02/12/2018     OB History   No obstetric history on file.     Family History  Problem Relation Age of Onset  . Heart attack Father   . Hypertension Father   . Heart attack Mother   . Throat cancer Brother   . Breast cancer Daughter     Social History   Tobacco Use  . Smoking status: Never Smoker  . Smokeless tobacco: Never Used  Substance Use Topics  . Alcohol use: No    Alcohol/week: 0.0 standard drinks  . Drug use: No    Home Medications Prior to  Admission medications   Medication Sig Start Date End Date Taking? Authorizing Provider  acidophilus (RISAQUAD) CAPS capsule Take 1 capsule by mouth daily.    [provider]  Cholecalciferol (VITAMIN D-3) 1000 units CAPS Take 1 capsule See admin instructions by mouth. Take 1000 units in the morning and 2000 in the evening    [provider]  donepezil (ARICEPT) 10 MG tablet TAKE 1 TABLET (10 MG TOTAL) BY MOUTH AT BEDTIME. 06/03/19   Melvenia Beam, MD  Fexofenadine HCl (ALLERGY 24-HR PO) Take 1 capsule by mouth daily.    [provider]  hydrochlorothiazide (HYDRODIURIL) 25 MG tablet Take 1 tablet (25 mg total) by mouth daily. 04/19/19    Tower, Wynelle Fanny, MD  hydrocortisone (ANUSOL-HC) 2.5 % rectal cream Place 1 application rectally 2 (two) times daily as needed for hemorrhoids or anal itching. 06/21/19   Tower, Wynelle Fanny, MD  losartan (COZAAR) 100 MG tablet Take 1 tablet (100 mg total) by mouth daily. 04/19/19   Tower, Wynelle Fanny, MD  memantine (NAMENDA) 10 MG tablet TAKE 1 TABLET (10 MG TOTAL) BY MOUTH 2 (TWO) TIMES DAILY. 02/21/19   Melvenia Beam, MD  metoprolol tartrate (LOPRESSOR) 25 MG tablet Take 1 tablet (25 mg total) by mouth 2 (two) times daily. 04/19/19   Tower, Wynelle Fanny, MD  Multiple Vitamins-Minerals ([employer] SPECTRAVITE PO) Take 1 capsule by mouth daily.    [provider]  potassium chloride (KLOR-CON M10) 10 MEQ tablet Take 2 tablets (20 mEq total) by mouth daily. 04/19/19   Tower, Wynelle Fanny, MD  Probiotic Product (PROBIOTIC PO) Take 1 tablet by mouth daily.    [provider]  vitamin B-12 (CYANOCOBALAMIN) 1000 MCG tablet Take 1,000 mcg by mouth daily.    [provider]    Allergies    Alendronate sodium, Amlodipine besylate, Calcitonin (salmon), Plavix [clopidogrel bisulfate], and Simvastatin  Review of Systems   Review of Systems  Constitutional: Negative for chills and fever.  HENT: Negative for ear pain and sore throat.   Eyes: Negative for pain and visual disturbance.  Respiratory: Negative for cough and shortness of breath.   Cardiovascular: Negative for chest pain and palpitations.  Gastrointestinal: Positive for anal bleeding and diarrhea. Negative for abdominal pain and vomiting.  Genitourinary: Negative for dysuria and hematuria.  Musculoskeletal: Negative for arthralgias and back pain.  Skin: Negative for color change and rash.  Neurological: Negative for seizures and syncope.  All other systems reviewed and are negative.   Physical Exam Updated Vital Signs BP 124/86 (BP Location: Left Arm)   Pulse 66   Temp 97.7 F (36.5 C) (Axillary)   Resp 18   Wt 74.1 kg   SpO2 98%    BMI 31.90 kg/m   Physical Exam Vitals and nursing note reviewed.  Constitutional:      General: She is in acute distress.     Appearance: Normal appearance. She is well-developed. She is obese. She is not ill-appearing or toxic-appearing.  HENT:     Head: Normocephalic and atraumatic.     Right Ear: External ear normal.     Left Ear: External ear normal.     Nose: Nose normal. No rhinorrhea.     Mouth/Throat:     Mouth: Mucous membranes are moist.  Eyes:     General:        Right eye: No discharge.        Left eye: No discharge.     Conjunctiva/sclera: Conjunctivae normal.  Cardiovascular:     Rate and Rhythm: Normal rate and regular rhythm.     Pulses: Normal pulses.     Heart sounds: Normal heart sounds. No murmur.  Pulmonary:     Effort: Pulmonary effort is normal. No respiratory distress.     Breath sounds: Normal breath sounds. No wheezing or rales.  Abdominal:     General: Abdomen is flat. There is no distension.     Palpations: Abdomen is soft.     Tenderness: There is abdominal tenderness.     Comments: Her abdomen is soft, nondistended with left lower quadrant tenderness to palpation and guarding  Genitourinary:    Rectum: Guaiac result positive.     Comments: Inflamed external hemorrhoids with no active bleeding.  There is dried blood covering the hemorrhoids.  Small amount of normal brown stool on DRE Musculoskeletal:        General: No deformity or signs of injury. Normal range of motion.     Cervical back: Normal range of motion and neck supple.  Skin:    General: Skin is warm and dry.     Capillary Refill: Capillary refill takes less than 2 seconds.     Coloration: Skin is not jaundiced.  Neurological:     General: No focal deficit present.     Mental Status: She is alert. Mental status is at baseline.  Psychiatric:        Mood and Affect: Mood normal.        Behavior: Behavior normal.     ED Results / Procedures / Treatments   Labs (all labs  ordered are listed, but only abnormal results are displayed) Labs Reviewed  COMPREHENSIVE METABOLIC PANEL - Abnormal; Notable for the following components:      Result Value   Glucose, Bld 118 (*)    Creatinine, Ser 1.28 (*)    Albumin 3.4 (*)    GFR calc non Af Amer 38 (*)    GFR calc Af Amer 44 (*)    All other components within normal limits  POC OCCULT BLOOD, ED - Abnormal; Notable for the following components:   Fecal Occult Bld POSITIVE (*)    All other components within normal limits  SARS CORONAVIRUS 2 (TAT 6-24 HRS)  CBC  PROTIME-INR  CBC  BASIC METABOLIC PANEL  URINALYSIS, ROUTINE W REFLEX MICROSCOPIC  TYPE AND SCREEN    EKG None  Radiology CT ABDOMEN PELVIS W CONTRAST  Result Date: 07/05/2019 CLINICAL DATA:  Diverticulitis. EXAM: CT ABDOMEN AND PELVIS WITH CONTRAST TECHNIQUE: Multidetector CT imaging of the abdomen and pelvis was performed using the standard protocol following bolus administration of intravenous contrast. CONTRAST:  42mL OMNIPAQUE IOHEXOL 300 MG/ML  SOLN COMPARISON:  Nov 07, 2011 FINDINGS: Lower chest: There is presumed atelectasis at the left lung base.The heart size is borderline enlarged. Hepatobiliary: Again noted is a hemangioma in the left hepatic lobe. Cholelithiasis without acute inflammation.There is no biliary ductal dilation. Pancreas: Normal contours without ductal dilatation. No peripancreatic fluid collection. Spleen: No splenic laceration or hematoma. Adrenals/Urinary Tract: --Adrenal glands: No adrenal hemorrhage. --Right kidney/ureter: There is an exophytic cyst arising from the right kidney measuring approximately 4.8 cm. There is no right-sided hydronephrosis or radiopaque obstructing kidney stone --Left kidney/ureter: . --Urinary bladder: Unremarkable. Stomach/Bowel: --Stomach/Duodenum: There is a large hiatal hernia. --Small bowel: No dilatation or inflammation. --Colon: There is a large amount of stool in the rectum. There is severe sigmoid  diverticulosis without CT evidence for diverticulitis. There is a  rounded approximately 2.6 cm density in the colon at the level of the a hepatic flexure (axial series 2, image 35). --Appendix: Not visualized. No right lower quadrant inflammation or free fluid. Vascular/Lymphatic: Atherosclerotic calcification is present within the non-aneurysmal abdominal aorta, without hemodynamically significant stenosis. There is a questionable filling defect within the right superficial femoral vein extending into the right common femoral vein. --No retroperitoneal lymphadenopathy. --No mesenteric lymphadenopathy. --No pelvic or inguinal lymphadenopathy. Reproductive: Status post hysterectomy. No adnexal mass. Other: No ascites or free air. The abdominal wall is normal. Musculoskeletal. There are degenerative changes throughout the thoracolumbar spine without evidence for an acute displaced fracture. IMPRESSION: 1. No definite acute abnormality detected. 2. There is a questionable filling defect within the right superficial femoral vein extending into the right common femoral vein. Recommend correlation with lower extremity venous Doppler. 3. There is a rounded approximately 2.6 cm density in the colon at the level of the a hepatic flexure. While this could represent stool, a mass is not excluded. Recommend correlation with colonoscopy. 4. There is a large amount of stool in the rectum. Severe sigmoid diverticulosis without CT evidence for diverticulitis. 5. Cholelithiasis without acute inflammation. 6. Large hiatal hernia. Aortic Atherosclerosis (ICD10-I70.0). Electronically Signed   By: Constance Holster M.D.   On: 07/05/2019 20:32    Procedures Procedures (including critical care time)  Medications Ordered in ED Medications  pantoprazole (PROTONIX) 80 mg in sodium chloride 0.9 % 250 mL (0.32 mg/mL) infusion (8 mg/hr Intravenous Transfusing/Transfer 07/06/19 0133)  pantoprazole (PROTONIX) injection 40 mg (has no  administration in time range)  acetaminophen (TYLENOL) tablet 650 mg (has no administration in time range)    Or  acetaminophen (TYLENOL) suppository 650 mg (has no administration in time range)  ondansetron (ZOFRAN) tablet 4 mg (has no administration in time range)    Or  ondansetron (ZOFRAN) injection 4 mg (has no administration in time range)  donepezil (ARICEPT) tablet 10 mg (10 mg Oral Given 07/06/19 0040)  hydrocortisone (ANUSOL-HC) 2.5 % rectal cream 1 application (has no administration in time range)  memantine (NAMENDA) tablet 10 mg (10 mg Oral Given 07/06/19 0040)  metoprolol tartrate (LOPRESSOR) tablet 25 mg (25 mg Oral Given 07/06/19 0040)  lactated ringers bolus 1,000 mL (0 mLs Intravenous Stopped 07/05/19 2040)  iohexol (OMNIPAQUE) 300 MG/ML solution 80 mL (80 mLs Intravenous Contrast Given 07/05/19 1938)  pantoprazole (PROTONIX) 80 mg in sodium chloride 0.9 % 100 mL IVPB (0 mg Intravenous Stopped 07/06/19 0119)    ED Course  I have reviewed the triage vital signs and the nursing notes.  Pertinent labs & imaging results that were available during my care of the patient were reviewed by me and considered in my medical decision making (see chart for details).    MDM Rules/Calculators/A&P                      Wendy Krueger is a 86 y.o. female with a medical history of hemorrhoids, dementia, hyperlipidemia who presents to the ED for rectal bleeding and diarrhea.  Daughter states that she has had intermittent diarrhea for the past month.  Daughter reports sporadic rectal bleeding, states is bright red blood and seems to be from external hemorrhoids.  She reports diarrhea is nonbloody.  She has not had any recent antibiotics.  She does not take blood thinner.  She denies fever, chest pain, abdominal pain, shortness of breath, vomiting.  HPI and physical exam as above. She presents awake, alert,  confused, hemodynamically stable, afebrile. Her abdomen is soft, nondistended with left lower  quadrant tenderness to palpation and guarding. Inflamed external hemorrhoids with no active bleeding.  There is dried blood covering the hemorrhoids.  Small amount of normal brown stool on DRE  Laboratory work significant for mild AKI.  Her hemoglobin is stable at 12.9.  Hemoccult positive.  Obtained CT of her abdomen which demonstrates: questionable filling defect within the right superficial femoral vein extending into the right common femoral vein. Recommend correlation with lower extremity venous Doppler. There is a rounded approximately 2.6 cm density in the colon at the level of the a hepatic flexure. While this could represent stool, a mass is not excluded. Recommend correlation with Colonoscopy. There is a large amount of stool in the rectum. Severe sigmoid diverticulosis without CT evidence for diverticulitis.  She had a bowel movement that was frank melena.  Due to this and questionable DVT we will plan to admit her to medicine for further management.  She likely has contra indication for anticoagulation.  She likely needs a duplex ultrasound of her lower extremities to confirm DVT.  I consulted medicine for admission, they have evaluated the patient and agree with plan for admission. I discussed this plan with the patient and family, they understand and agree with plan for admission.    Final Clinical Impression(s) / ED Diagnoses Final diagnoses:  None    Rx / DC Orders ED Discharge Orders    None       Chaselyn Nanney, Lovena Le, MD 07/06/19 [id number]    Gareth Morgan, MD 07/08/19 1427

## 2019-07-05 NOTE — Telephone Encounter (Signed)
Copied from [place] [id number]. Topic: General - Inquiry >> Jul 01, 2019  2:42 PM Richardo Priest, [place] wrote: Reason for CRM: Pt's daughter called in stating covid-19 test was negative and pt is still having very bad diarrhea. Please advise if script can be given. Please advise.

## 2019-07-05 NOTE — ED Triage Notes (Signed)
Patient here with caregiver who reports hx of hemorrhoid and has had intermittent bleeding with same. Reports recurrent bleeding since yesterday

## 2019-07-05 NOTE — H&P (Signed)
History and Physical    Wendy Krueger [id number] DOB: 11-28-1929 DOA: 07/05/2019  PCP: Abner Greenspan, MD  Patient coming from: Home  I have personally briefly reviewed patient's old medical records in [place]  Chief Complaint: BRBPR  HPI: Wendy Krueger is a 86 y.o. female with medical history significant of dementia, prior GI bleed, HTN.  Not on anticoagulants.  Patient has been having intermittent diarrhea for the past several weeks.  Intermittent c/o abd pain with this.  Family tried giving her peptobismol for a couple of days, last on Sat.  Today patient with BRBPR, not clear if from hemorrhoids or not.  Patient with pain and AMS with this.  Pt brought in to ED.   ED Course: In ED CT abd/pelvis shows: 1) large volume of stool in rectum, 2) ? Of rectal mass vs stool, 3) diverticulosis without evidence of active diverticulitis, 4) ? Of RLE DVT.  Patient without leg swelling or pain.  Initially EDP was thinking that patient just had hemorrhoid bleed, and wanted to start patient on lovenox and check [place] of leg in AM.  However patient with melanotic appearing BM in room.  Stool is of course heme positive.  HGB 12.9  Remainder of lab work up also mostly unimpressive though UA is still pending.   Review of Systems: As per HPI, otherwise all review of systems negative.  Past Medical History:  Diagnosis Date  . Asymptomatic gallstones   . BRBPR (bright red blood per rectum)   . Cataract 04/13/2018   bilateral eyes  . Cholelithiasis   . Colitis - presumed infectious origin    one ER visit   . Dementia ([place])   . Duodenitis   . Fall   . Gastritis   . GI bleed   . Hiatal hernia   . Hyperlipidemia   . Hypertension   . Osteoarthritis   . Osteoporosis   . Stroke ([place])   . Syncope     Past Surgical History:  Procedure Laterality Date  . ABDOMINAL HYSTERECTOMY     BSO- fibroids  . ABI's     normal  . dexa  1/05   osteoporosis  .  ESOPHAGOGASTRODUODENOSCOPY N/A 03/21/2014   Procedure: ESOPHAGOGASTRODUODENOSCOPY (EGD);  Surgeon: Milus Banister, MD;  Location: [facility];  Service: Endoscopy;  Laterality: N/A;  . left foot brace    . WRIST FRACTURE SURGERY  2/01   R arm  . WRIST FRACTURE SURGERY Left 02/12/2018     reports that she has never smoked. She has never used smokeless tobacco. She reports that she does not drink alcohol or use drugs.  Allergies  Allergen Reactions  . Alendronate Sodium Other (See Comments)    Leg pain   . Amlodipine Besylate Hives  . Calcitonin ([place]) Other (See Comments)     headache/ head pressure  . Plavix [Clopidogrel Bisulfate]     drowsy    . Simvastatin Other (See Comments)    Leg pain     Family History  Problem Relation Age of Onset  . Heart attack Father   . Hypertension Father   . Heart attack Mother   . Throat cancer Brother   . Breast cancer Daughter      Prior to Admission medications   Medication Sig Start Date End Date Taking? Authorizing Provider  acidophilus (RISAQUAD) CAPS capsule Take 1 capsule by mouth daily.    [provider]  Cholecalciferol (VITAMIN D-3) 1000 units CAPS Take 1  capsule See admin instructions by mouth. Take 1000 units in the morning and 2000 in the evening    [provider]  donepezil (ARICEPT) 10 MG tablet TAKE 1 TABLET (10 MG TOTAL) BY MOUTH AT BEDTIME. 06/03/19   Melvenia Beam, MD  Fexofenadine HCl (ALLERGY 24-HR PO) Take 1 capsule by mouth daily.    [provider]  hydrochlorothiazide (HYDRODIURIL) 25 MG tablet Take 1 tablet (25 mg total) by mouth daily. 04/19/19   Tower, Wynelle Fanny, MD  hydrocortisone (ANUSOL-HC) 2.5 % rectal cream Place 1 application rectally 2 (two) times daily as needed for hemorrhoids or anal itching. 06/21/19   Tower, Wynelle Fanny, MD  losartan (COZAAR) 100 MG tablet Take 1 tablet (100 mg total) by mouth daily. 04/19/19   Tower, Wynelle Fanny, MD  memantine (NAMENDA) 10 MG tablet TAKE 1  TABLET (10 MG TOTAL) BY MOUTH 2 (TWO) TIMES DAILY. 02/21/19   Melvenia Beam, MD  metoprolol tartrate (LOPRESSOR) 25 MG tablet Take 1 tablet (25 mg total) by mouth 2 (two) times daily. 04/19/19   Tower, Wynelle Fanny, MD  Multiple Vitamins-Minerals ([employer] SPECTRAVITE PO) Take 1 capsule by mouth daily.    [provider]  potassium chloride (KLOR-CON M10) 10 MEQ tablet Take 2 tablets (20 mEq total) by mouth daily. 04/19/19   Tower, Wynelle Fanny, MD  Probiotic Product (PROBIOTIC PO) Take 1 tablet by mouth daily.    [provider]  vitamin B-12 (CYANOCOBALAMIN) 1000 MCG tablet Take 1,000 mcg by mouth daily.    [provider]    Physical Exam: Vitals:   07/05/19 2245 07/05/19 2309 07/05/19 2315 07/05/19 2330  BP: 136/75 140/70  137/89  Pulse: 75 69 81 69  Resp: 17 16 17 16   Temp:  98.3 F (36.8 C)    TempSrc:  Oral    SpO2: 100% 100% 100% 100%  Weight:        Constitutional: NAD, calm, comfortable Eyes: PERRL, lids and conjunctivae normal ENMT: Mucous membranes are moist. Posterior pharynx clear of any exudate or lesions.Normal dentition.  Neck: normal, supple, no masses, no thyromegaly Respiratory: clear to auscultation bilaterally, no wheezing, no crackles. Normal respiratory effort. No accessory muscle use.  Cardiovascular: Regular rate and rhythm, no murmurs / rubs / gallops. No extremity edema. 2+ pedal pulses. No carotid bruits.  Abdomen: no tenderness, no masses palpated. No hepatosplenomegaly. Bowel sounds positive.  Musculoskeletal: no clubbing / cyanosis. No joint deformity upper and lower extremities. Good ROM, no contractures. Normal muscle tone.  Skin: no rashes, lesions, ulcers. No induration Neurologic: CN 2-12 grossly intact. Sensation intact, DTR normal. Strength 5/5 in all 4.  Psychiatric: Normal judgment and insight. Alert and oriented x 3. Normal mood.    Labs on Admission: I have personally reviewed following labs and imaging studies  CBC: Recent  Labs  Lab 07/05/19 1139  WBC 6.1  HGB 12.9  HCT 41.4  MCV 98.3  PLT 99991111   Basic Metabolic Panel: Recent Labs  Lab 07/05/19 1139  NA 140  K 3.7  CL 104  CO2 24  GLUCOSE 118*  BUN 15  CREATININE 1.28*  CALCIUM 10.2   GFR: Estimated Creatinine Clearance: 28 mL/min (A) (by C-G formula based on SCr of 1.28 mg/dL (H)). Liver Function Tests: Recent Labs  Lab 07/05/19 1139  AST 25  ALT 12  ALKPHOS 67  BILITOT 0.5  PROT 6.7  ALBUMIN 3.4*   No results for input(s): LIPASE, AMYLASE in the last 168 hours.  No results for input(s): AMMONIA in the last 168 hours. Coagulation Profile: Recent Labs  Lab 07/05/19 1825  INR 1.1   Cardiac Enzymes: No results for input(s): CKTOTAL, CKMB, CKMBINDEX, TROPONINI in the last 168 hours. BNP (last 3 results) No results for input(s): PROBNP in the last 8760 hours. HbA1C: No results for input(s): HGBA1C in the last 72 hours. CBG: No results for input(s): GLUCAP in the last 168 hours. Lipid Profile: No results for input(s): CHOL, HDL, LDLCALC, TRIG, CHOLHDL, LDLDIRECT in the last 72 hours. Thyroid Function Tests: No results for input(s): TSH, T4TOTAL, FREET4, T3FREE, THYROIDAB in the last 72 hours. Anemia Panel: No results for input(s): VITAMINB12, FOLATE, FERRITIN, TIBC, IRON, RETICCTPCT in the last 72 hours. Urine analysis:    Component Value Date/Time   COLORURINE YELLOW 07/05/2017 [place] 07/05/2017 1221   LABSPEC 1.010 07/05/2017 1221   PHURINE 7.0 07/05/2017 1221   GLUCOSEU NEGATIVE 07/05/2017 1221   HGBUR NEGATIVE 07/05/2017 1221   BILIRUBINUR Negative 02/18/2019 [place] 07/05/2017 1221   PROTEINUR Positive (A) 02/18/2019 1658   PROTEINUR NEGATIVE 07/05/2017 1221   UROBILINOGEN 1.0 02/18/2019 1658   UROBILINOGEN 0.2 03/20/2014 0753   NITRITE Negaitve 02/18/2019 1658   NITRITE NEGATIVE 07/05/2017 1221   LEUKOCYTESUR Negative 02/18/2019 1658    Radiological Exams on Admission: CT  ABDOMEN PELVIS W CONTRAST  Result Date: 07/05/2019 CLINICAL DATA:  Diverticulitis. EXAM: CT ABDOMEN AND PELVIS WITH CONTRAST TECHNIQUE: Multidetector CT imaging of the abdomen and pelvis was performed using the standard protocol following bolus administration of intravenous contrast. CONTRAST:  75mL OMNIPAQUE IOHEXOL 300 MG/ML  SOLN COMPARISON:  Nov 07, 2011 FINDINGS: Lower chest: There is presumed atelectasis at the left lung base.The heart size is borderline enlarged. Hepatobiliary: Again noted is a hemangioma in the left hepatic lobe. Cholelithiasis without acute inflammation.There is no biliary ductal dilation. Pancreas: Normal contours without ductal dilatation. No peripancreatic fluid collection. Spleen: No splenic laceration or hematoma. Adrenals/Urinary Tract: --Adrenal glands: No adrenal hemorrhage. --Right kidney/ureter: There is an exophytic cyst arising from the right kidney measuring approximately 4.8 cm. There is no right-sided hydronephrosis or radiopaque obstructing kidney stone --Left kidney/ureter: . --Urinary bladder: Unremarkable. Stomach/Bowel: --Stomach/Duodenum: There is a large hiatal hernia. --Small bowel: No dilatation or inflammation. --Colon: There is a large amount of stool in the rectum. There is severe sigmoid diverticulosis without CT evidence for diverticulitis. There is a rounded approximately 2.6 cm density in the colon at the level of the a hepatic flexure (axial series 2, image 35). --Appendix: Not visualized. No right lower quadrant inflammation or free fluid. Vascular/Lymphatic: Atherosclerotic calcification is present within the non-aneurysmal abdominal aorta, without hemodynamically significant stenosis. There is a questionable filling defect within the right superficial femoral vein extending into the right common femoral vein. --No retroperitoneal lymphadenopathy. --No mesenteric lymphadenopathy. --No pelvic or inguinal lymphadenopathy. Reproductive: Status post  hysterectomy. No adnexal mass. Other: No ascites or free air. The abdominal wall is normal. Musculoskeletal. There are degenerative changes throughout the thoracolumbar spine without evidence for an acute displaced fracture. IMPRESSION: 1. No definite acute abnormality detected. 2. There is a questionable filling defect within the right superficial femoral vein extending into the right common femoral vein. Recommend correlation with lower extremity venous Doppler. 3. There is a rounded approximately 2.6 cm density in the colon at the level of the a hepatic flexure. While this could represent stool, a mass is not excluded. Recommend correlation with colonoscopy. 4. There is a  large amount of stool in the rectum. Severe sigmoid diverticulosis without CT evidence for diverticulitis. 5. Cholelithiasis without acute inflammation. 6. Large hiatal hernia. Aortic Atherosclerosis (ICD10-I70.0). Electronically Signed   By: Constance Holster M.D.   On: 07/05/2019 20:32    EKG: Independently reviewed.  Assessment/Plan Principal Problem:   Melena Active Problems:   Essential hypertension   Dementia (HCC)   BRBPR (bright red blood per rectum)   Acute encephalopathy    1. GIB - melena vs black stool due to peptobismol + BRBPR; hemoccult positive 1. BRBPR possible sources include: 1. Hemorrhoids, which she has and seem to be bleeding 2. Diverticular bleed, has diverticulosis without diverticulitis on imaging 3. 2.6cm density in colon which could be mass or just stool on CT; EDP couldn't palpate any mass nor impaction on exam. 2. Black tarry stool could be due to melena from UGIB, or just chronic diarrhea that shes been having for some weeks + peptobismol that family gave her to try and treat upset stomach. 3. Plan: 1. Clear liquid diet 2. Hold peptobismol 3. Repeat CBC in AM 4. Call GI in AM 5. Will put on empiric protonix 2. ? Of RLE DVT vs artifact on CT - 1. No DVT symptoms on exam or history 2. No  pulmonary symptoms 3. Has BRBPR at least with ? Melena 4. Holding off on anticoagulation for the moment 5. [place] LE to look for DVT in AM 6. If present consider IVC filter 3. HTN - 1. not sure how large the GIB is. 2. Will hold losartan and HCTZ for the moment 3. Continue metoprolol 4. Dementia - continue home meds 5. Acute encephalopathy - 1. Possibly due to GIB 2. Checking UA to r/o UTI  DVT prophylaxis: SCD, left Code Status: DNR Family Communication: Daughter at bedside Disposition Plan: Home after admit Consults called: None, call GI in AM Admission status: Place in 110     Future [place] Hospitalists  How to contact the [facility] Attending or Consulting provider [place] or covering provider during after hours [place], for this patient?  1. Check the care team in [facility] and look for a) attending/consulting [facility] provider listed and b) the [facility] team listed 2. Log into [email or web address]  Amion Physician Scheduling and messaging for groups and whole hospitals  On call and physician scheduling software for group practices, residents, hospitalists and other medical providers for call, clinic, rotation and shift schedules. On[employer] is a hospital-wide system for scheduling doctors and paging doctors on call. EasyPlot is for scientific plotting and data analysis.  [email or web address]  and use 's universal password to access. If you do not have the password, please contact the hospital operator.  3. Locate the [facility] provider you are looking for under [facility] and page to a number that you can be directly reached. 4. If you still have difficulty reaching the provider, please page the [facility] (Director on Call) for the Hospitalists listed on amion for assistance.  07/05/2019, 11:57 PM

## 2019-07-05 NOTE — Telephone Encounter (Signed)
I think they need to take her to the ER for urgent eval - the blood may be from hemorrhoids (has a h/o that) but in combination with mental status change and abdominal pain I am worried about an infection   Keep me posted

## 2019-07-05 NOTE — Telephone Encounter (Signed)
How is her abdominal pain ? Any other symptoms?  Did pepto help at all? Have they tried immodium or kaopectate or anything else otc  Please let me know so I can advise  I know she had C diff in the distant past and want to r/o that or other infection  Could they pick up cups to do a stool sample?

## 2019-07-05 NOTE — ED Notes (Signed)
Patient transported to CT 

## 2019-07-05 NOTE — Progress Notes (Signed)
ANTICOAGULATION CONSULT NOTE - Initial Consult  Pharmacy Consult for Lovenox Indication: chest pain/ACS  Vital Signs: Temp: 98.4 F (36.9 C) (01/04 1742) Temp Source: Oral (01/04 1742) BP: 135/94 (01/04 2212) Pulse Rate: 77 (01/04 2230)  Labs: Recent Labs    07/05/19 1139 07/05/19 1825  HGB 12.9  --   HCT 41.4  --   PLT 212  --   LABPROT  --  14.2  INR  --  1.1  CREATININE 1.28*  --       Assessment: Concern for DVT in lower extremity. Starting empiric anticoagulation. Noted that patient reports bleeding from rectum. Discussed with MD, likely hemorrhoidal in nature. Risks vs benefits in favor of starting anticoagulation. Cbc stable.   Goal of Therapy:  Anti-Xa level 0.6-1 units/ml 4hrs after LMWH dose given Monitor platelets by anticoagulation protocol: Yes    Plan:  -Lovenox 75 mg [place] q24h -Monitor renal fx, s/sx bleeding   Harvel Quale 07/05/2019,10:34 PM

## 2019-07-05 NOTE — Telephone Encounter (Signed)
See 06/29/19 phonenote.

## 2019-07-05 NOTE — Telephone Encounter (Signed)
Pt's daughter [name] notified of Dr. Marliss Coots comments and instructions. Daughter will take pt to ER now

## 2019-07-05 NOTE — Telephone Encounter (Signed)
Patient's daughter [name] left a voicemail stating that her mom is now bleeding from her rectum. [name] stated they are wondering if she has a bowel infection because she is in a lot of pain. [name] stated that they are wondering if she also has a UTI because she is talking crazy out of her head. [name] stated that patient does not want to eat her drink because of all of the pain she has when she uses the bathroom. [name] stated that all of the medication they have given her for the diarrhea has made it hard for her to use the bathroom. [name] stated that her mom is in such pain she is crying and they can't stand to see her this way. [name] stated that she is the daughter that lives with her mom. [name] stated that her sister [name] has POA for patient.

## 2019-07-06 ENCOUNTER — Ambulatory Visit (HOSPITAL_COMMUNITY): Payer: Medicare Other

## 2019-07-06 ENCOUNTER — Inpatient Hospital Stay (HOSPITAL_COMMUNITY): Payer: Medicare Other

## 2019-07-06 DIAGNOSIS — I10 Essential (primary) hypertension: Secondary | ICD-10-CM | POA: Diagnosis not present

## 2019-07-06 DIAGNOSIS — I82451 Acute embolism and thrombosis of right peroneal vein: Secondary | ICD-10-CM | POA: Diagnosis present

## 2019-07-06 DIAGNOSIS — Z90710 Acquired absence of both cervix and uterus: Secondary | ICD-10-CM | POA: Diagnosis not present

## 2019-07-06 DIAGNOSIS — K648 Other hemorrhoids: Secondary | ICD-10-CM | POA: Diagnosis present

## 2019-07-06 DIAGNOSIS — K5909 Other constipation: Secondary | ICD-10-CM | POA: Diagnosis present

## 2019-07-06 DIAGNOSIS — K922 Gastrointestinal hemorrhage, unspecified: Secondary | ICD-10-CM | POA: Diagnosis present

## 2019-07-06 DIAGNOSIS — F0390 Unspecified dementia without behavioral disturbance: Secondary | ICD-10-CM | POA: Diagnosis present

## 2019-07-06 DIAGNOSIS — Z8673 Personal history of transient ischemic attack (TIA), and cerebral infarction without residual deficits: Secondary | ICD-10-CM | POA: Diagnosis not present

## 2019-07-06 DIAGNOSIS — I82431 Acute embolism and thrombosis of right popliteal vein: Secondary | ICD-10-CM | POA: Diagnosis present

## 2019-07-06 DIAGNOSIS — F0150 Vascular dementia without behavioral disturbance: Secondary | ICD-10-CM

## 2019-07-06 DIAGNOSIS — I129 Hypertensive chronic kidney disease with stage 1 through stage 4 chronic kidney disease, or unspecified chronic kidney disease: Secondary | ICD-10-CM | POA: Diagnosis present

## 2019-07-06 DIAGNOSIS — M810 Age-related osteoporosis without current pathological fracture: Secondary | ICD-10-CM | POA: Diagnosis present

## 2019-07-06 DIAGNOSIS — K5731 Diverticulosis of large intestine without perforation or abscess with bleeding: Secondary | ICD-10-CM | POA: Diagnosis present

## 2019-07-06 DIAGNOSIS — E875 Hyperkalemia: Secondary | ICD-10-CM | POA: Diagnosis present

## 2019-07-06 DIAGNOSIS — K625 Hemorrhage of anus and rectum: Secondary | ICD-10-CM | POA: Diagnosis not present

## 2019-07-06 DIAGNOSIS — G9340 Encephalopathy, unspecified: Secondary | ICD-10-CM | POA: Diagnosis not present

## 2019-07-06 DIAGNOSIS — R935 Abnormal findings on diagnostic imaging of other abdominal regions, including retroperitoneum: Secondary | ICD-10-CM | POA: Diagnosis not present

## 2019-07-06 DIAGNOSIS — K921 Melena: Secondary | ICD-10-CM | POA: Diagnosis not present

## 2019-07-06 DIAGNOSIS — Z79899 Other long term (current) drug therapy: Secondary | ICD-10-CM | POA: Diagnosis not present

## 2019-07-06 DIAGNOSIS — I82401 Acute embolism and thrombosis of unspecified deep veins of right lower extremity: Secondary | ICD-10-CM | POA: Diagnosis not present

## 2019-07-06 DIAGNOSIS — R197 Diarrhea, unspecified: Secondary | ICD-10-CM | POA: Diagnosis not present

## 2019-07-06 DIAGNOSIS — K644 Residual hemorrhoidal skin tags: Secondary | ICD-10-CM | POA: Diagnosis present

## 2019-07-06 DIAGNOSIS — R52 Pain, unspecified: Secondary | ICD-10-CM | POA: Diagnosis not present

## 2019-07-06 DIAGNOSIS — E876 Hypokalemia: Secondary | ICD-10-CM | POA: Diagnosis present

## 2019-07-06 DIAGNOSIS — K641 Second degree hemorrhoids: Secondary | ICD-10-CM | POA: Diagnosis not present

## 2019-07-06 DIAGNOSIS — N183 Chronic kidney disease, stage 3 unspecified: Secondary | ICD-10-CM | POA: Diagnosis present

## 2019-07-06 DIAGNOSIS — I82411 Acute embolism and thrombosis of right femoral vein: Secondary | ICD-10-CM | POA: Diagnosis present

## 2019-07-06 DIAGNOSIS — Z66 Do not resuscitate: Secondary | ICD-10-CM | POA: Diagnosis present

## 2019-07-06 DIAGNOSIS — R195 Other fecal abnormalities: Secondary | ICD-10-CM | POA: Diagnosis not present

## 2019-07-06 DIAGNOSIS — Z20822 Contact with and (suspected) exposure to covid-19: Secondary | ICD-10-CM | POA: Diagnosis present

## 2019-07-06 DIAGNOSIS — I82441 Acute embolism and thrombosis of right tibial vein: Secondary | ICD-10-CM | POA: Diagnosis present

## 2019-07-06 DIAGNOSIS — D649 Anemia, unspecified: Secondary | ICD-10-CM | POA: Diagnosis present

## 2019-07-06 DIAGNOSIS — G9341 Metabolic encephalopathy: Secondary | ICD-10-CM | POA: Diagnosis present

## 2019-07-06 HISTORY — PX: IR IVC FILTER PLMT / S&I /IMG GUID/MOD SED: IMG701

## 2019-07-06 LAB — BASIC METABOLIC PANEL
Anion gap: 9 (ref 5–15)
BUN: 12 mg/dL (ref 8–23)
CO2: 27 mmol/L (ref 22–32)
Calcium: 9.5 mg/dL (ref 8.9–10.3)
Chloride: 104 mmol/L (ref 98–111)
Creatinine, Ser: 1.31 mg/dL — ABNORMAL HIGH (ref 0.44–1.00)
GFR calc Af Amer: 42 mL/min — ABNORMAL LOW (ref >60)
GFR calc non Af Amer: 37 mL/min — ABNORMAL LOW (ref >60)
Glucose, Bld: 96 mg/dL (ref 70–99)
Potassium: 3.1 mmol/L — ABNORMAL LOW (ref 3.5–5.1)
Sodium: 140 mmol/L (ref 135–145)

## 2019-07-06 LAB — CBC
HCT: 34.6 % — ABNORMAL LOW (ref 36.0–46.0)
Hemoglobin: 11.1 g/dL — ABNORMAL LOW (ref 12.0–15.0)
MCH: 30.6 pg (ref 26.0–34.0)
MCHC: 32.1 g/dL (ref 30.0–36.0)
MCV: 95.3 fL (ref 80.0–100.0)
Platelets: 166 10*3/uL (ref 150–400)
RBC: 3.63 MIL/uL — ABNORMAL LOW (ref 3.87–5.11)
RDW: 12.7 % (ref 11.5–15.5)
WBC: 4.6 10*3/uL (ref 4.0–10.5)
nRBC: 0 % (ref 0.0–0.2)

## 2019-07-06 LAB — SARS CORONAVIRUS 2 (TAT 6-24 HRS): SARS Coronavirus 2: NEGATIVE

## 2019-07-06 MED ORDER — HYDROCORTISONE ACETATE 25 MG RE SUPP
25.0000 mg | Freq: Two times a day (BID) | RECTAL | Status: DC
  Administered 2019-07-06 – 2019-07-12 (×13): 25 mg via RECTAL
  Filled 2019-07-06 (×13): qty 1

## 2019-07-06 MED ORDER — LIDOCAINE HCL 1 % IJ SOLN
INTRAMUSCULAR | Status: AC
  Filled 2019-07-06: qty 20

## 2019-07-06 MED ORDER — LIDOCAINE HCL 1 % IJ SOLN
INTRAMUSCULAR | Status: DC | PRN
  Administered 2019-07-06: 5 mL

## 2019-07-06 MED ORDER — DONEPEZIL HCL 10 MG PO TABS
10.0000 mg | ORAL_TABLET | Freq: Every day | ORAL | Status: DC
  Administered 2019-07-06 – 2019-07-11 (×7): 10 mg via ORAL
  Filled 2019-07-06: qty 1
  Filled 2019-07-06: qty 2
  Filled 2019-07-06 (×5): qty 1

## 2019-07-06 MED ORDER — METOPROLOL TARTRATE 25 MG PO TABS
25.0000 mg | ORAL_TABLET | Freq: Two times a day (BID) | ORAL | Status: DC
  Administered 2019-07-06 – 2019-07-12 (×14): 25 mg via ORAL
  Filled 2019-07-06 (×14): qty 1

## 2019-07-06 MED ORDER — MEMANTINE HCL 10 MG PO TABS
10.0000 mg | ORAL_TABLET | Freq: Two times a day (BID) | ORAL | Status: DC
  Administered 2019-07-06 – 2019-07-12 (×14): 10 mg via ORAL
  Filled 2019-07-06 (×14): qty 1

## 2019-07-06 MED ORDER — PANTOPRAZOLE SODIUM 40 MG PO TBEC
40.0000 mg | DELAYED_RELEASE_TABLET | Freq: Every day | ORAL | Status: DC
  Administered 2019-07-07 – 2019-07-12 (×6): 40 mg via ORAL
  Filled 2019-07-06 (×6): qty 1

## 2019-07-06 MED ORDER — HYDROCORTISONE (PERIANAL) 2.5 % EX CREA
1.0000 "application " | TOPICAL_CREAM | Freq: Two times a day (BID) | CUTANEOUS | Status: DC | PRN
  Filled 2019-07-06: qty 28.35

## 2019-07-06 NOTE — Progress Notes (Signed)
Lower extremity venous has been completed.   Preliminary results in CV Proc.   Abram Sander 07/06/2019 11:52 AM

## 2019-07-06 NOTE — Progress Notes (Signed)
PROGRESS NOTE    Wendy Krueger  [id number] DOB: 1930/02/01 DOA: 07/05/2019 PCP: Abner Greenspan, MD   Brief Narrative:  HPI on 07/05/2019 by Dr. Jennette Kettle Wendy Krueger is a 86 y.o. female with medical history significant of dementia, prior GI bleed, HTN. Not on anticoagulants. Patient has been having intermittent diarrhea for the past several weeks.  Intermittent c/o abd pain with this.  Family tried giving her peptobismol for a couple of days, last on Sat. Today patient with BRBPR, not clear if from hemorrhoids or not.  Patient with pain and AMS with this.  Pt brought in to ED.  Interim history Admitted with GI bleed.  FOBT was positive.  Hemoglobin currently stable.  Currently on Protonix drip.  Gastroenterology consulted and appreciated.  Assessment & Plan   GI bleed -FOBT was positive, patient was noted to have bright red blood per rectum, however per H&P, patient was noted to have black stool and she has recently been on Pepto-Bismol. -?  Question hemorrhoidal bleeding vs diverticular bleed (patient with diverticulosis without diverticulitis on imaging) -CT abdomen pelvis showed no definite acute abnormality.  Questionable filling defect in the right superficial femoral vein extending into the right common femoral vein, lower extremity Doppler recommended.  2.6 cm density in the colon at the level of hepatic flexure could represent stool, mass not excluded.  Large amount of stool in the rectum.  Severe sigmoid diverticulosis. -Hemoglobin currently 11.1 -Currently on Protonix drip -Gastroenterology consulted and appreciated  Right lower extremity DVT -Noted on CT scan as above -Lower extremity Doppler: Acute DVT involving the right common femoral vein, SF junction, right proximal vein, right proximal profunda vein, right popliteal vein, right posterior tibial veins, right peroneal vein. -Given possible GI bleed, likely will need IVC filter- Interventional radiology  consulted and appreciated  Essential hypertension -Given possible GI bleed, losartan HCTZ currently held -Continue metoprolol  Dementia -Appears stable continue Namenda and Aricept  Acute metabolic encephalopathy -Secondary to the above, unknown baseline -UA pending -COVID negative -Lung exam unremarkable -Currently afebrile with no leukocytosis  DVT Prophylaxis left SCD  Code Status: DNR  Family Communication: None at bedside  Disposition Plan: Admitted. Pending GI and IR consult. Dispo TBD>   Consultants Gastroenterology Interventional radiology  Procedures  Right lower extremity Doppler  Antibiotics   Anti-infectives (From admission, onward)   None      Subjective:   Wendy Krueger seen and examined today.  Patient with dementia.  Has no complaints this morning.  States she is doing okay.  Objective:   Vitals:   07/05/19 2315 07/05/19 2330 07/06/19 0150 07/06/19 0533  BP:  137/89 124/86 (!) 104/58  Pulse: 81 69 66 67  Resp: 17 16 18 16   Temp:   97.7 F (36.5 C) 97.7 F (36.5 C)  TempSrc:   Axillary   SpO2: 100% 100% 98% 100%  Weight:   74.1 kg     Intake/Output Summary (Last 24 hours) at 07/06/2019 1326 Last data filed at 07/06/2019 1244 Gross per 24 hour  Intake 1461.2 ml  Output --  Net 1461.2 ml   Filed Weights   07/05/19 2230 07/06/19 0150  Weight: 74.8 kg 74.1 kg    Exam  General: Well developed, early, NAD  HEENT: NCAT,  mucous membranes moist.   Cardiovascular: S1 S2 auscultated, RRR  Respiratory: Clear to auscultation bilaterally  Abdomen: Soft, nontender, nondistended, + bowel sounds  Extremities: warm dry without cyanosis clubbing or edema of LLE.  RLE edema  Neuro: AAOx 1 (self only) nonfocal  Psych: Pleasantly confused    Data Reviewed: I have personally reviewed following labs and imaging studies  CBC: Recent Labs  Lab 07/05/19 1139 07/06/19 1102  WBC 6.1 4.6  HGB 12.9 11.1*  HCT 41.4 34.6*  MCV 98.3 95.3   PLT 212 [id number]   Basic Metabolic Panel: Recent Labs  Lab 07/05/19 1139 07/06/19 1102  NA 140 140  K 3.7 3.1*  CL 104 104  CO2 24 27  GLUCOSE 118* 96  BUN 15 12  CREATININE 1.28* 1.31*  CALCIUM 10.2 9.5   GFR: Estimated Creatinine Clearance: 27.2 mL/min (A) (by C-G formula based on SCr of 1.31 mg/dL (H)). Liver Function Tests: Recent Labs  Lab 07/05/19 1139  AST 25  ALT 12  ALKPHOS 67  BILITOT 0.5  PROT 6.7  ALBUMIN 3.4*   No results for input(s): LIPASE, AMYLASE in the last 168 hours. No results for input(s): AMMONIA in the last 168 hours. Coagulation Profile: Recent Labs  Lab 07/05/19 1825  INR 1.1   Cardiac Enzymes: No results for input(s): CKTOTAL, CKMB, CKMBINDEX, TROPONINI in the last 168 hours. BNP (last 3 results) No results for input(s): PROBNP in the last 8760 hours. HbA1C: No results for input(s): HGBA1C in the last 72 hours. CBG: No results for input(s): GLUCAP in the last 168 hours. Lipid Profile: No results for input(s): CHOL, HDL, LDLCALC, TRIG, CHOLHDL, LDLDIRECT in the last 72 hours. Thyroid Function Tests: No results for input(s): TSH, T4TOTAL, FREET4, T3FREE, THYROIDAB in the last 72 hours. Anemia Panel: No results for input(s): VITAMINB12, FOLATE, FERRITIN, TIBC, IRON, RETICCTPCT in the last 72 hours. Urine analysis:    Component Value Date/Time   COLORURINE YELLOW 07/05/2017 [place] 07/05/2017 1221   LABSPEC 1.010 07/05/2017 1221   PHURINE 7.0 07/05/2017 1221   GLUCOSEU NEGATIVE 07/05/2017 1221   HGBUR NEGATIVE 07/05/2017 1221   BILIRUBINUR Negative 02/18/2019 [place] 07/05/2017 1221   PROTEINUR Positive (A) 02/18/2019 1658   PROTEINUR NEGATIVE 07/05/2017 1221   UROBILINOGEN 1.0 02/18/2019 1658   UROBILINOGEN 0.2 03/20/2014 0753   NITRITE Negaitve 02/18/2019 1658   NITRITE NEGATIVE 07/05/2017 1221   LEUKOCYTESUR Negative 02/18/2019 1658   Sepsis  Labs: @LABRCNTIP (procalcitonin:4,lacticidven:4)  ) Recent Results (from the past 240 hour(s))  Novel Coronavirus, NAA ([facility])     Status: None   Collection Time: 06/29/19  2:37 PM   Specimen: Nasopharyngeal(NP) swabs in vial transport medium   NASOPHARYNGE  TESTING  Result Value Ref Range Status   SARS-CoV-2, NAA Not Detected Not Detected Final    Comment: This nucleic acid amplification test was developed and its performance characteristics determined by [employer]. Nucleic acid amplification tests include PCR and TMA. This test has not been FDA cleared or approved. This test has been authorized by FDA under an Emergency Use Authorization (EUA). This test is only authorized for the duration of time the declaration that circumstances exist justifying the authorization of the emergency use of in vitro diagnostic tests for detection of SARS-CoV-2 virus and/or diagnosis of COVID-19 infection under section 564(b)(1) of the Act, 21 U.S.C. [id number]) (1), unless the authorization is terminated or revoked sooner. When diagnostic testing is negative, the possibility of a false negative result should be considered in the context of a patient's recent exposures and the presence of clinical signs and symptoms consistent with COVID-19. An individual without symptoms of COVID-19 and who is not shedding SARS-CoV-2 virus  would  expect to have a negative (not detected) result in this assay.   SARS CORONAVIRUS 2 (TAT 6-24 HRS) Nasopharyngeal Nasopharyngeal Swab     Status: None   Collection Time: 07/06/19 12:30 AM   Specimen: Nasopharyngeal Swab  Result Value Ref Range Status   SARS Coronavirus 2 NEGATIVE NEGATIVE Final    Comment: (NOTE) SARS-CoV-2 target nucleic acids are NOT DETECTED. The SARS-CoV-2 RNA is generally detectable in upper and lower respiratory specimens during the acute phase of infection. Negative results do not preclude SARS-CoV-2 infection, do not rule  out co-infections with other pathogens, and should not be used as the sole basis for treatment or other patient management decisions. Negative results must be combined with clinical observations, patient history, and epidemiological information. The expected result is Negative. Fact Sheet for Patients: SugarRoll.be Fact Sheet for Healthcare Providers: https://www.woods-mathews.com/ This test is not yet approved or cleared by the [place] FDA and  has been authorized for detection and/or diagnosis of SARS-CoV-2 by FDA under an Emergency Use Authorization (EUA). This EUA will remain  in effect (meaning this test can be used) for the duration of the COVID-19 declaration under Section 56 4(b)(1) of the Act, 21 U.S.C. section 360bbb-3(b)(1), unless the authorization is terminated or revoked sooner. Performed at [facility], [place] [address], [place], [place] [address]       Radiology Studies: CT ABDOMEN PELVIS W CONTRAST  Result Date: 07/05/2019 CLINICAL DATA:  Diverticulitis. EXAM: CT ABDOMEN AND PELVIS WITH CONTRAST TECHNIQUE: Multidetector CT imaging of the abdomen and pelvis was performed using the standard protocol following bolus administration of intravenous contrast. CONTRAST:  74mL OMNIPAQUE IOHEXOL 300 MG/ML  SOLN COMPARISON:  Nov 07, 2011 FINDINGS: Lower chest: There is presumed atelectasis at the left lung base.The heart size is borderline enlarged. Hepatobiliary: Again noted is a hemangioma in the left hepatic lobe. Cholelithiasis without acute inflammation.There is no biliary ductal dilation. Pancreas: Normal contours without ductal dilatation. No peripancreatic fluid collection. Spleen: No splenic laceration or hematoma. Adrenals/Urinary Tract: --Adrenal glands: No adrenal hemorrhage. --Right kidney/ureter: There is an exophytic cyst arising from the right kidney measuring approximately 4.8 cm. There is no right-sided hydronephrosis  or radiopaque obstructing kidney stone --Left kidney/ureter: . --Urinary bladder: Unremarkable. Stomach/Bowel: --Stomach/Duodenum: There is a large hiatal hernia. --Small bowel: No dilatation or inflammation. --Colon: There is a large amount of stool in the rectum. There is severe sigmoid diverticulosis without CT evidence for diverticulitis. There is a rounded approximately 2.6 cm density in the colon at the level of the a hepatic flexure (axial series 2, image 35). --Appendix: Not visualized. No right lower quadrant inflammation or free fluid. Vascular/Lymphatic: Atherosclerotic calcification is present within the non-aneurysmal abdominal aorta, without hemodynamically significant stenosis. There is a questionable filling defect within the right superficial femoral vein extending into the right common femoral vein. --No retroperitoneal lymphadenopathy. --No mesenteric lymphadenopathy. --No pelvic or inguinal lymphadenopathy. Reproductive: Status post hysterectomy. No adnexal mass. Other: No ascites or free air. The abdominal wall is normal. Musculoskeletal. There are degenerative changes throughout the thoracolumbar spine without evidence for an acute displaced fracture. IMPRESSION: 1. No definite acute abnormality detected. 2. There is a questionable filling defect within the right superficial femoral vein extending into the right common femoral vein. Recommend correlation with lower extremity venous Doppler. 3. There is a rounded approximately 2.6 cm density in the colon at the level of the a hepatic flexure. While this could represent stool, a mass is not excluded. Recommend  correlation with colonoscopy. 4. There is a large amount of stool in the rectum. Severe sigmoid diverticulosis without CT evidence for diverticulitis. 5. Cholelithiasis without acute inflammation. 6. Large hiatal hernia. Aortic Atherosclerosis (ICD10-I70.0). Electronically Signed   By: Constance Holster M.D.   On: 07/05/2019 20:32   VAS  [place] LOWER EXTREMITY VENOUS (DVT) (ONLY [facility] & [facility])  Result Date: 07/06/2019  Lower Venous Study Indications: Pain.  Comparison Study: no prior Performing Technologist: Abram Sander RVS  Examination Guidelines: A complete evaluation includes B-mode imaging, spectral Doppler, color Doppler, and power Doppler as needed of all accessible portions of each vessel. Bilateral testing is considered an integral part of a complete examination. Limited examinations for reoccurring indications may be performed as noted.  +---------+---------------+---------+-----------+----------+--------------+  RIGHT     Compressibility Phasicity Spontaneity Properties Thrombus Aging  +---------+---------------+---------+-----------+----------+--------------+  CFV       None            No        No                                     +---------+---------------+---------+-----------+----------+--------------+  SFJ       None                                                             +---------+---------------+---------+-----------+----------+--------------+  FV Prox   None                                                             +---------+---------------+---------+-----------+----------+--------------+  FV Mid    None                                                             +---------+---------------+---------+-----------+----------+--------------+  FV Distal None                                                             +---------+---------------+---------+-----------+----------+--------------+  PFV       None                                                             +---------+---------------+---------+-----------+----------+--------------+  POP       None            No        No                                     +---------+---------------+---------+-----------+----------+--------------+  PTV       None                                                              +---------+---------------+---------+-----------+----------+--------------+  PERO      None                                                             +---------+---------------+---------+-----------+----------+--------------+   +----+---------------+---------+-----------+----------+--------------+  LEFT Compressibility Phasicity Spontaneity Properties Thrombus Aging  +----+---------------+---------+-----------+----------+--------------+  CFV  Full            Yes       Yes                                    +----+---------------+---------+-----------+----------+--------------+     Summary: Right: Findings consistent with acute deep vein thrombosis involving the right common femoral vein, SF junction, right femoral vein, right proximal profunda vein, right popliteal vein, right posterior tibial veins, and right peroneal veins. No cystic structure found in the popliteal fossa. Attempted to visualize iliac vein. Not visualized well.  *See table(s) above for measurements and observations.    Preliminary      Scheduled Meds:  donepezil  10 mg Oral QHS   memantine  10 mg Oral BID   metoprolol tartrate  25 mg Oral BID   [START ON 07/09/2019] pantoprazole  40 mg Intravenous Q12H   Continuous Infusions:  pantoprozole (PROTONIX) infusion 8 mg/hr (07/06/19 1140)     LOS: 0 days   Time Spent in minutes   45 minutes  Nakiea Metzner D.O. on 07/06/2019 at 1:26 PM  Between 7am to 7pm - Please see pager noted on [email or web address]  After 7pm go to [email or web address]  And look for the night coverage person covering for me after hours  [facility] Office  [phone]

## 2019-07-06 NOTE — Procedures (Signed)
Interventional Radiology Procedure Note  Procedure: Placement of a re-treivable IVC filter via right IJ approach   .  Complications: None  Recommendations:  - Routine wound care - Do not submerge for 7 days - If patient is OK for [facility] in 3-6 months, she can be referred to [place] [facility] to evaluate for filter removal.   Signed,  Dulcy Fanny. Earleen Newport, DO

## 2019-07-06 NOTE — Plan of Care (Signed)

## 2019-07-06 NOTE — Consult Note (Signed)
Chief Complaint: Patient was seen in consultation today for IVC filter placement.   Referring Physician(s):  Dr. Ree Kida  Supervising Physician: Corrie Mckusick  Patient Status: [facility] - [facility]  History of Present Illness: Wendy Krueger is a 86 y.o. female with a past medical history significant for cataracts, dementia, HTN, HLD, stroke and colitis who presented to [facility] ED on 07/05/19 due to diarrhea and rectal bleeding. Per patient's daughter Ms. Brailsford had been experiencing diarrhea for approximately 1 month, however yesterday was the first time they had noted bright red blood in her stool. Initial workup in the ED notable for (+) FOBT, hgb 12.9, hct 41.4, plt 212, INR 1.1. CT abd/pelvis w/contrast was performed and showed no definite acute abnormality, a rounded approximately 2.6 cm density in the colon at the level of hepatic flexure (stool vs mass), large amount of stool in the rectum, severe sigmoid diverticulosis, cholelithiasis and questionable filling defect within the right superficial femoral vein extending into the right CFV. She was admitted for further evaluation and management. BLE [place] was obtained to further evaluate filling defect seen on CT and was notable for acute DVT involving the right common femoral vein, SF junction, right femoral vein, proximal profunda vein, popliteal vein, posterior tibial veins and peroneal veins. IR has been consulted for IVC filter placement due to possible GI bleed preventing anticoagulation currently.  Wendy Krueger was seen in IR holding room with her [name], she is sleeping throughout much of the exam however she does awake to loud verbal cues. She follows commands appropriately during my exam but is unable to give any significant history due to dementia. Her daughter states that she and her siblings take turns caring for their mother with the help of a home health aid, they had a difficult time deciding to bring her to the ED as they were  concerned that she would be alone however they are very pleased they decided to have her evaluated. Wendy Krueger tells me that her mother is most immobile at home, they do encourage her to walk around the [place] a few times per day but she does not get much physical activity besides that. She has no history of cancer or hormone therapy that they are aware of. She states that they were previously told that she had "several mini strokes and a brain aneurysm" - she has not had any interventions for this and was previously on Plavix however she was unable to tolerate this medicine. One of her siblings told Wendy Krueger last night that they do remember their mother complaining of right leg pain one night about 2 weeks ago but they did not notice anything abnormal and she did not mention the pain again. She reports one bright red bloody bowel movement after lunch today. They are all in agreement with IVC filter placement.   Past Medical History:  Diagnosis Date  . Asymptomatic gallstones   . BRBPR (bright red blood per rectum)   . Cataract 04/13/2018   bilateral eyes  . Cholelithiasis   . Colitis - presumed infectious origin    one ER visit   . Dementia ([place])   . Duodenitis   . Fall   . Gastritis   . GI bleed   . Hiatal hernia   . Hyperlipidemia   . Hypertension   . Osteoarthritis   . Osteoporosis   . Stroke ([place])   . Syncope     Past Surgical History:  Procedure Laterality Date  . ABDOMINAL HYSTERECTOMY  BSO- fibroids  . ABI's     normal  . dexa  1/05   osteoporosis  . ESOPHAGOGASTRODUODENOSCOPY N/A 03/21/2014   Procedure: ESOPHAGOGASTRODUODENOSCOPY (EGD);  Surgeon: Milus Banister, MD;  Location: [place];  Service: Endoscopy;  Laterality: N/A;  . left foot brace    . WRIST FRACTURE SURGERY  2/01   R arm  . WRIST FRACTURE SURGERY Left 02/12/2018    Allergies: Alendronate sodium, Amlodipine besylate, Calcitonin (salmon), Plavix [clopidogrel bisulfate], and  Simvastatin  Medications: Prior to Admission medications   Medication Sig Start Date End Date Taking? Authorizing Provider  acidophilus (RISAQUAD) CAPS capsule Take 1 capsule by mouth daily.    [provider]  Cholecalciferol (VITAMIN D-3) 1000 units CAPS Take 1 capsule See admin instructions by mouth. Take 1000 units in the morning and 2000 in the evening    [provider]  donepezil (ARICEPT) 10 MG tablet TAKE 1 TABLET (10 MG TOTAL) BY MOUTH AT BEDTIME. 06/03/19   Melvenia Beam, MD  Fexofenadine HCl (ALLERGY 24-HR PO) Take 1 capsule by mouth daily.    [provider]  hydrochlorothiazide (HYDRODIURIL) 25 MG tablet Take 1 tablet (25 mg total) by mouth daily. 04/19/19   Tower, Wynelle Fanny, MD  hydrocortisone (ANUSOL-HC) 2.5 % rectal cream Place 1 application rectally 2 (two) times daily as needed for hemorrhoids or anal itching. 06/21/19   Tower, Wynelle Fanny, MD  losartan (COZAAR) 100 MG tablet Take 1 tablet (100 mg total) by mouth daily. 04/19/19   Tower, Wynelle Fanny, MD  memantine (NAMENDA) 10 MG tablet TAKE 1 TABLET (10 MG TOTAL) BY MOUTH 2 (TWO) TIMES DAILY. 02/21/19   Melvenia Beam, MD  metoprolol tartrate (LOPRESSOR) 25 MG tablet Take 1 tablet (25 mg total) by mouth 2 (two) times daily. 04/19/19   Tower, Wynelle Fanny, MD  Multiple Vitamins-Minerals ([employer] SPECTRAVITE PO) Take 1 capsule by mouth daily.    [provider]  potassium chloride (KLOR-CON M10) 10 MEQ tablet Take 2 tablets (20 mEq total) by mouth daily. 04/19/19   Tower, Wynelle Fanny, MD  Probiotic Product (PROBIOTIC PO) Take 1 tablet by mouth daily.    [provider]  vitamin B-12 (CYANOCOBALAMIN) 1000 MCG tablet Take 1,000 mcg by mouth daily.    [provider]     Family History  Problem Relation Age of Onset  . Heart attack Father   . Hypertension Father   . Heart attack Mother   . Throat cancer Brother   . Breast cancer Daughter     Social History   Socioeconomic History  .  Marital status: Widowed    Spouse name: Not on file  . Number of children: 5  . Years of education: 58  . Highest education level: Not on file  Occupational History  . Occupation: retired    Fish farm manager: RETIRED  Tobacco Use  . Smoking status: Never Smoker  . Smokeless tobacco: Never Used  Substance and Sexual Activity  . Alcohol use: No    Alcohol/week: 0.0 standard drinks  . Drug use: No  . Sexual activity: Never  Other Topics Concern  . Not on file  Social History Narrative   Has 4 brothers, 1 sister   Lives with 2 adult grandchildren and her daughter   Has 4 daughters, 1 son   Right handed   Rare caffeine    Social Determinants of Health   Financial Resource Strain: Low Risk   . Difficulty of Paying Living Expenses: Not  hard at all  Food Insecurity: No Food Insecurity  . Worried About Charity fundraiser in the Last Year: Never true  . Ran Out of Food in the Last Year: Never true  Transportation Needs: No Transportation Needs  . Lack of Transportation (Medical): No  . Lack of Transportation (Non-Medical): No  Physical Activity: Inactive  . Days of Exercise per Week: 0 days  . Minutes of Exercise per Session: 0 min  Stress: Stress Concern Present  . Feeling of Stress : To some extent  Social Connections:   . Frequency of Communication with Friends and Family: Not on file  . Frequency of Social Gatherings with Friends and Family: Not on file  . Attends Religious Services: Not on file  . Active Member of Clubs or Organizations: Not on file  . Attends Archivist Meetings: Not on file  . Marital Status: Not on file     Review of Systems: A 12 point ROS discussed and pertinent positives are indicated in the HPI above.  All other systems are negative.  Review of Systems  Unable to perform ROS: Dementia    Vital Signs: BP (!) 104/58 (BP Location: Left Arm)   Pulse 67   Temp 97.7 F (36.5 C)   Resp 16   Wt 163 lb 5.8 oz (74.1 kg)   SpO2 100%   BMI  31.90 kg/m   Physical Exam Vitals and nursing note reviewed.  Constitutional:      General: She is not in acute distress.    Comments: Sleeping during exam but arouses easily to loud verbal cues. Unable to provide history 2/2 dementia. [name] at bedside.  HENT:     Head: Normocephalic.     Mouth/Throat:     Mouth: Mucous membranes are moist.     Pharynx: Oropharynx is clear. No oropharyngeal exudate.     Comments: (+) several small purple areas on tip of tongue.  (+) full upper and lower dentures Cardiovascular:     Rate and Rhythm: Normal rate and regular rhythm.     Pulses: Normal pulses.  Pulmonary:     Effort: Pulmonary effort is normal.     Breath sounds: Normal breath sounds.  Abdominal:     General: Bowel sounds are normal. There is no distension.     Palpations: Abdomen is soft.     Tenderness: There is no abdominal tenderness.  Musculoskeletal:        General: No tenderness.     Right lower leg: No edema.     Left lower leg: No edema.  Skin:    General: Skin is warm and dry.  Neurological:     Mental Status: Mental status is at baseline.      MD Evaluation Airway: WNL Heart: WNL Abdomen: WNL Chest/ Lungs: WNL ASA  Classification: 3 Mallampati/Airway Score: Two   Imaging: CT ABDOMEN PELVIS W CONTRAST  Result Date: 07/05/2019 CLINICAL DATA:  Diverticulitis. EXAM: CT ABDOMEN AND PELVIS WITH CONTRAST TECHNIQUE: Multidetector CT imaging of the abdomen and pelvis was performed using the standard protocol following bolus administration of intravenous contrast. CONTRAST:  44mL OMNIPAQUE IOHEXOL 300 MG/ML  SOLN COMPARISON:  Nov 07, 2011 FINDINGS: Lower chest: There is presumed atelectasis at the left lung base.The heart size is borderline enlarged. Hepatobiliary: Again noted is a hemangioma in the left hepatic lobe. Cholelithiasis without acute inflammation.There is no biliary ductal dilation. Pancreas: Normal contours without ductal dilatation. No  peripancreatic fluid collection. Spleen: No splenic laceration  or hematoma. Adrenals/Urinary Tract: --Adrenal glands: No adrenal hemorrhage. --Right kidney/ureter: There is an exophytic cyst arising from the right kidney measuring approximately 4.8 cm. There is no right-sided hydronephrosis or radiopaque obstructing kidney stone --Left kidney/ureter: . --Urinary bladder: Unremarkable. Stomach/Bowel: --Stomach/Duodenum: There is a large hiatal hernia. --Small bowel: No dilatation or inflammation. --Colon: There is a large amount of stool in the rectum. There is severe sigmoid diverticulosis without CT evidence for diverticulitis. There is a rounded approximately 2.6 cm density in the colon at the level of the a hepatic flexure (axial series 2, image 35). --Appendix: Not visualized. No right lower quadrant inflammation or free fluid. Vascular/Lymphatic: Atherosclerotic calcification is present within the non-aneurysmal abdominal aorta, without hemodynamically significant stenosis. There is a questionable filling defect within the right superficial femoral vein extending into the right common femoral vein. --No retroperitoneal lymphadenopathy. --No mesenteric lymphadenopathy. --No pelvic or inguinal lymphadenopathy. Reproductive: Status post hysterectomy. No adnexal mass. Other: No ascites or free air. The abdominal wall is normal. Musculoskeletal. There are degenerative changes throughout the thoracolumbar spine without evidence for an acute displaced fracture. IMPRESSION: 1. No definite acute abnormality detected. 2. There is a questionable filling defect within the right superficial femoral vein extending into the right common femoral vein. Recommend correlation with lower extremity venous Doppler. 3. There is a rounded approximately 2.6 cm density in the colon at the level of the a hepatic flexure. While this could represent stool, a mass is not excluded. Recommend correlation with colonoscopy. 4. There is a large  amount of stool in the rectum. Severe sigmoid diverticulosis without CT evidence for diverticulitis. 5. Cholelithiasis without acute inflammation. 6. Large hiatal hernia. Aortic Atherosclerosis (ICD10-I70.0). Electronically Signed   By: Constance Holster M.D.   On: 07/05/2019 20:32   VAS [place] LOWER EXTREMITY VENOUS (DVT) (ONLY [facility] & [facility])  Result Date: 07/06/2019  Lower Venous Study Indications: Pain.  Comparison Study: no prior Performing Technologist: Abram Sander RVS  Examination Guidelines: A complete evaluation includes B-mode imaging, spectral Doppler, color Doppler, and power Doppler as needed of all accessible portions of each vessel. Bilateral testing is considered an integral part of a complete examination. Limited examinations for reoccurring indications may be performed as noted.  +---------+---------------+---------+-----------+----------+--------------+ RIGHT    CompressibilityPhasicitySpontaneityPropertiesThrombus Aging +---------+---------------+---------+-----------+----------+--------------+ CFV      None           No       No                                  +---------+---------------+---------+-----------+----------+--------------+ SFJ      None                                                        +---------+---------------+---------+-----------+----------+--------------+ FV Prox  None                                                        +---------+---------------+---------+-----------+----------+--------------+ FV Mid   None                                                        +---------+---------------+---------+-----------+----------+--------------+  FV DistalNone                                                        +---------+---------------+---------+-----------+----------+--------------+ PFV      None                                                        +---------+---------------+---------+-----------+----------+--------------+ POP       None           No       No                                  +---------+---------------+---------+-----------+----------+--------------+ PTV      None                                                        +---------+---------------+---------+-----------+----------+--------------+ PERO     None                                                        +---------+---------------+---------+-----------+----------+--------------+   +----+---------------+---------+-----------+----------+--------------+ LEFTCompressibilityPhasicitySpontaneityPropertiesThrombus Aging +----+---------------+---------+-----------+----------+--------------+ CFV Full           Yes      Yes                                 +----+---------------+---------+-----------+----------+--------------+     Summary: Right: Findings consistent with acute deep vein thrombosis involving the right common femoral vein, SF junction, right femoral vein, right proximal profunda vein, right popliteal vein, right posterior tibial veins, and right peroneal veins. No cystic structure found in the popliteal fossa. Attempted to visualize iliac vein. Not visualized well.  *See table(s) above for measurements and observations. Electronically signed by Harold Barban MD on 07/06/2019 at 1:45:29 PM.    Final     Labs:  CBC: Recent Labs    01/18/19 1345 04/19/19 1157 07/05/19 1139 07/06/19 1102  WBC 8.2 4.6 6.1 4.6  HGB 13.5 13.4 12.9 11.1*  HCT 42.9 40.0 41.4 34.6*  PLT 164 184.0 212 166    COAGS: Recent Labs    07/05/19 1825  INR 1.1    BMP: Recent Labs    01/18/19 1345 04/19/19 1157 07/05/19 1139 07/06/19 1102  NA 136 136 140 140  K 3.8 3.7 3.7 3.1*  CL 98 98 104 104  CO2 27 29 24 27   GLUCOSE 109* 92 118* 96  BUN 13 12 15 12   CALCIUM 10.3 10.0 10.2 9.5  CREATININE 1.25* 0.98 1.28* 1.31*  GFRNONAA 39*  --  38* 37*  GFRAA 45*  --  44* 42*    LIVER FUNCTION TESTS: Recent Labs    07/14/18 1624  04/19/19 1157 07/05/19 1139  BILITOT 0.3 0.6 0.5  AST 19 19 25   ALT 12 11 12   ALKPHOS 60 69 67  PROT 6.9 6.9 6.7  ALBUMIN 4.0 4.1 3.4*    TUMOR MARKERS: No results for input(s): AFPTM, CEA, CA199, CHROMGRNA in the last 8760 hours.  Assessment and Plan:  86 y/o F with PMH has per HPI currently admitted for evaluation of bright red blood per rectum and incidentally found to have extensive RLE DVT, as she is not currently a candidate for anticoagulation in the setting of GI bleeding IR has been consulted for IVC filter placement.  Patient is not NPO currently - will proceed with IV fentanyl only. Procedure discussed in detail with patient's [name] [facility]) today who consents to IVC filter placement. Afebrile, WBC 4.6, hgb 11.1, plt 166, INR 1.1, creatinine 1.31.  Risks and benefits discussed with the patient's [name] including, but not limited to bleeding, infection, contrast induced renal failure, filter fracture or migration which can lead to emergency surgery or even death, strut penetration with damage or irritation to adjacent structures and caval thrombosis.  All of the patient's daughter's questions were answered, patient's daughter agreeable to proceed.  Consent signed and in chart.  Thank you for this interesting consult.  I greatly enjoyed meeting Wendy Krueger and look forward to participating in their care.  A copy of this report was sent to the requesting provider on this date.  Electronically Signed: Joaquim Nam, PA-C 07/06/2019, 2:52 PM   I spent a total of 40 Minutes  in face to face in clinical consultation, greater than 50% of which was counseling/coordinating care for IVC filter placement.

## 2019-07-06 NOTE — ED Notes (Signed)
ED TO INPATIENT HANDOFF REPORT  ED Nurse Name and Phone #: [phone]  S Name/Age/Gender Wilmer Floor 86 y.o. female Room/Bed: 051C/051C  Code Status   Code Status: DNR  Home/SNF/Other Home Patient oriented to: self Is this baseline? Yes   Triage Complete: Triage complete  Chief Complaint Melena [K92.1]  Triage Note Patient here with caregiver who reports hx of hemorrhoid and has had intermittent bleeding with same. Reports recurrent bleeding since yesterday    Allergies Allergies  Allergen Reactions  . Alendronate Sodium Other (See Comments)    Leg pain   . Amlodipine Besylate Hives  . Calcitonin ([place]) Other (See Comments)     headache/ head pressure  . Plavix [Clopidogrel Bisulfate]     drowsy    . Simvastatin Other (See Comments)    Leg pain     Level of Care/Admitting Diagnosis ED Disposition    ED Disposition Condition Comment   Admit  Hospital Area: [facility] [[id number]]  Level of Care: Telemetry Medical [104]  I expect the patient will be discharged within 24 hours: No (not a candidate for 5C-Observation unit)  Covid Evaluation: Asymptomatic Screening Protocol (No Symptoms)  Diagnosis: Melena [[id number]]  Admitting Physician: Etta Quill [phone]  Attending Physician: Etta Quill [[id number]  PT Class (Do Not Modify): Observation [104]  PT Acc Code (Do Not Modify): Observation [10022]       B Medical/Surgery History Past Medical History:  Diagnosis Date  . Asymptomatic gallstones   . BRBPR (bright red blood per rectum)   . Cataract 04/13/2018   bilateral eyes  . Cholelithiasis   . Colitis - presumed infectious origin    one ER visit   . Dementia ([place])   . Duodenitis   . Fall   . Gastritis   . GI bleed   . Hiatal hernia   . Hyperlipidemia   . Hypertension   . Osteoarthritis   . Osteoporosis   . Stroke ([place])   . Syncope    Past Surgical History:  Procedure Laterality Date  . ABDOMINAL HYSTERECTOMY      BSO- fibroids  . ABI's     normal  . dexa  1/05   osteoporosis  . ESOPHAGOGASTRODUODENOSCOPY N/A 03/21/2014   Procedure: ESOPHAGOGASTRODUODENOSCOPY (EGD);  Surgeon: Milus Banister, MD;  Location: [facility];  Service: Endoscopy;  Laterality: N/A;  . left foot brace    . WRIST FRACTURE SURGERY  2/01   R arm  . WRIST FRACTURE SURGERY Left 02/12/2018     A IV Location/Drains/Wounds Patient Lines/Drains/Airways Status   Active Line/Drains/Airways    Name:   Placement date:   Placement time:   Site:   Days:   Peripheral IV 07/05/19 Right Antecubital   07/05/19    2308    Antecubital   1          Intake/Output Last 24 hours  Intake/Output Summary (Last 24 hours) at 07/06/2019 0112 Last data filed at 07/05/2019 2040 Gross per 24 hour  Intake 1000 ml  Output --  Net 1000 ml    Labs/Imaging Results for orders placed or performed during the hospital encounter of 07/05/19 (from the past 48 hour(s))  Comprehensive metabolic panel     Status: Abnormal   Collection Time: 07/05/19 11:39 AM  Result Value Ref Range   Sodium 140 135 - 145 mmol/L   Potassium 3.7 3.5 - 5.1 mmol/L   Chloride 104 98 - 111 mmol/L   CO2 24 22 -  32 mmol/L   Glucose, Bld 118 (H) 70 - 99 mg/dL   BUN 15 8 - 23 mg/dL   Creatinine, Ser 1.28 (H) 0.44 - 1.00 mg/dL   Calcium 10.2 8.9 - 10.3 mg/dL   Total Protein 6.7 6.5 - 8.1 g/dL   Albumin 3.4 (L) 3.5 - 5.0 g/dL   AST 25 15 - 41 U/L   ALT 12 0 - 44 U/L   Alkaline Phosphatase 67 38 - 126 U/L   Total Bilirubin 0.5 0.3 - 1.2 mg/dL   GFR calc non Af Amer 38 (L) >60 mL/min   GFR calc Af Amer 44 (L) >60 mL/min   Anion gap 12 5 - 15    Comment: Performed at [facility] [address], [place], [place] [address]  CBC     Status: None   Collection Time: 07/05/19 11:39 AM  Result Value Ref Range   WBC 6.1 4.0 - 10.5 K/uL   RBC 4.21 3.87 - 5.11 MIL/uL   Hemoglobin 12.9 12.0 - 15.0 g/dL   HCT 41.4 36.0 - 46.0 %   MCV 98.3 80.0 - 100.0 fL   MCH 30.6 26.0 -  34.0 pg   MCHC 31.2 30.0 - 36.0 g/dL   RDW 12.6 11.5 - 15.5 %   Platelets 212 150 - 400 K/uL   nRBC 0.0 0.0 - 0.2 %    Comment: Performed at [facility], [place] [address], [place], [place] [address]  Type and screen [place]     Status: None   Collection Time: 07/05/19 11:40 AM  Result Value Ref Range   ABO/RH(D) O POS    Antibody Screen NEG    Sample Expiration      07/08/2019,2359 Performed at [facility], [place] [address] [place], [place] [address]   Protime-INR     Status: None   Collection Time: 07/05/19  6:25 PM  Result Value Ref Range   Prothrombin Time 14.2 11.4 - 15.2 seconds   INR 1.1 0.8 - 1.2    Comment: (NOTE) INR goal varies based on device and disease states. Performed at [facility], [place] [address] [place], [place] [address]   POC occult blood, ED     Status: Abnormal   Collection Time: 07/05/19  8:31 PM  Result Value Ref Range   Fecal Occult Bld POSITIVE (A) NEGATIVE   CT ABDOMEN PELVIS W CONTRAST  Result Date: 07/05/2019 CLINICAL DATA:  Diverticulitis. EXAM: CT ABDOMEN AND PELVIS WITH CONTRAST TECHNIQUE: Multidetector CT imaging of the abdomen and pelvis was performed using the standard protocol following bolus administration of intravenous contrast. CONTRAST:  71mL OMNIPAQUE IOHEXOL 300 MG/ML  SOLN COMPARISON:  Nov 07, 2011 FINDINGS: Lower chest: There is presumed atelectasis at the left lung base.The heart size is borderline enlarged. Hepatobiliary: Again noted is a hemangioma in the left hepatic lobe. Cholelithiasis without acute inflammation.There is no biliary ductal dilation. Pancreas: Normal contours without ductal dilatation. No peripancreatic fluid collection. Spleen: No splenic laceration or hematoma. Adrenals/Urinary Tract: --Adrenal glands: No adrenal hemorrhage. --Right kidney/ureter: There is an exophytic cyst arising from the right kidney measuring approximately 4.8 cm. There is no right-sided hydronephrosis or  radiopaque obstructing kidney stone --Left kidney/ureter: . --Urinary bladder: Unremarkable. Stomach/Bowel: --Stomach/Duodenum: There is a large hiatal hernia. --Small bowel: No dilatation or inflammation. --Colon: There is a large amount of stool in the rectum. There is severe sigmoid diverticulosis without CT evidence for diverticulitis. There is  a rounded approximately 2.6 cm density in the colon at the level of the a hepatic flexure (axial series 2, image 35). --Appendix: Not visualized. No right lower quadrant inflammation or free fluid. Vascular/Lymphatic: Atherosclerotic calcification is present within the non-aneurysmal abdominal aorta, without hemodynamically significant stenosis. There is a questionable filling defect within the right superficial femoral vein extending into the right common femoral vein. --No retroperitoneal lymphadenopathy. --No mesenteric lymphadenopathy. --No pelvic or inguinal lymphadenopathy. Reproductive: Status post hysterectomy. No adnexal mass. Other: No ascites or free air. The abdominal wall is normal. Musculoskeletal. There are degenerative changes throughout the thoracolumbar spine without evidence for an acute displaced fracture. IMPRESSION: 1. No definite acute abnormality detected. 2. There is a questionable filling defect within the right superficial femoral vein extending into the right common femoral vein. Recommend correlation with lower extremity venous Doppler. 3. There is a rounded approximately 2.6 cm density in the colon at the level of the a hepatic flexure. While this could represent stool, a mass is not excluded. Recommend correlation with colonoscopy. 4. There is a large amount of stool in the rectum. Severe sigmoid diverticulosis without CT evidence for diverticulitis. 5. Cholelithiasis without acute inflammation. 6. Large hiatal hernia. Aortic Atherosclerosis (ICD10-I70.0). Electronically Signed   By: Constance Holster M.D.   On: 07/05/2019 20:32     Pending Labs Unresulted Labs (From admission, onward)    Start     Ordered   07/06/19 0600  CBC  ONCE - STAT,   STAT     07/05/19 2237   07/06/19 [id number]  Basic metabolic panel  Tomorrow morning,   R     07/05/19 2336   07/06/19 0001  Urinalysis, Routine w reflex microscopic  ONCE - STAT,   STAT     07/06/19 0000   07/05/19 2357  SARS CORONAVIRUS 2 (TAT 6-24 HRS) Nasopharyngeal Nasopharyngeal Swab  (Tier 3 (TAT 6-24 hrs))  Once,   STAT    Question Answer Comment  Is this test for diagnosis or screening Screening   Symptomatic for COVID-19 as defined by CDC No   Hospitalized for COVID-19 No   Admitted to ICU for COVID-19 No   Previously tested for COVID-19 Yes   Resident in a congregate (group) care setting No   Employed in healthcare setting No   Pregnant No      07/05/19 2357          Vitals/Pain Today's Vitals   07/05/19 2245 07/05/19 2309 07/05/19 2315 07/05/19 2330  BP: 136/75 140/70  137/89  Pulse: 75 69 81 69  Resp: 17 16 17 16   Temp:  98.3 F (36.8 C)    TempSrc:  Oral    SpO2: 100% 100% 100% 100%  Weight:        Isolation Precautions No active isolations  Medications Medications  pantoprazole (PROTONIX) 80 mg in sodium chloride 0.9 % 250 mL (0.32 mg/mL) infusion (8 mg/hr Intravenous New Bag/Given 07/06/19 0046)  pantoprazole (PROTONIX) injection 40 mg (has no administration in time range)  acetaminophen (TYLENOL) tablet 650 mg (has no administration in time range)    Or  acetaminophen (TYLENOL) suppository 650 mg (has no administration in time range)  ondansetron (ZOFRAN) tablet 4 mg (has no administration in time range)    Or  ondansetron (ZOFRAN) injection 4 mg (has no administration in time range)  donepezil (ARICEPT) tablet 10 mg (10 mg Oral Given 07/06/19 0040)  hydrocortisone (ANUSOL-HC) 2.5 % rectal cream 1 application (has no administration in time range)  memantine (NAMENDA) tablet 10 mg (10 mg Oral Given 07/06/19 0040)  metoprolol tartrate  (LOPRESSOR) tablet 25 mg (25 mg Oral Given 07/06/19 0040)  lactated ringers bolus 1,000 mL (0 mLs Intravenous Stopped 07/05/19 2040)  iohexol (OMNIPAQUE) 300 MG/ML solution 80 mL (80 mLs Intravenous Contrast Given 07/05/19 1938)  pantoprazole (PROTONIX) 80 mg in sodium chloride 0.9 % 100 mL IVPB (80 mg Intravenous New Bag/Given 07/06/19 0047)    Mobility walks with device High fall risk   Focused Assessments Cardiac Assessment Handoff:  Cardiac Rhythm: Sinus tachycardia, Bundle branch block Lab Results  Component Value Date   CKTOTAL 90 05/09/2017   CKMB 1.8 01/05/2011   TROPONINI 0.06 (HH) 05/07/2017   Lab Results  Component Value Date   DDIMER 5.46 (H) 01/07/2011   Does the Patient currently have chest pain? No     R Recommendations: See Admitting Provider Note  Report given to:   Additional Notes:  Patient is from home with daughter at primary caregiver; Pt has hx of dementia; Suspected GI bleed with black stools; Pt is Alert to self and pleasant in ED while daughter at the bedside; Covid test is pending-Monique,RN

## 2019-07-06 NOTE — Consult Note (Addendum)
[facility] Consult: 8:30 AM 07/07/2019  LOS: 1 day    Referring Provider: Dr Ree Kida  Primary Care Physician:  Tower, Wynelle Fanny, MD Primary Gastroenterologist:  Dr. Ardis Hughs.    Reason for Consultation:  Melena   HPI: Wendy Krueger is a 86 y.o. female.  PMH dementia.  Hypertension.  CVA.  Previously on Plavix, discontinued in 01/2018.  CKD 3.   Colitis 2013 suspected secondary to infection.  C. difficile positive stool 03/2014, treated with Flagyl.  Hematochezia, black stools, transfusion requiring anemia in setting of unopposed aspirin.  03/2014 03/2014 EGD.  Gastritis, bulbar duodenitis.  H. pylori negative.  Treated with PPI, p.o. iron. Has had issues with minor rectal bleeding from her large internal and external hemorrhoids.  She has never undergone colonoscopy and as of 2019 there were no plans for colonoscopy due to advanced age and comorbidities..  Presented to the ED 1/4 morning with rectal bleeding, loose stools.  Taking bismuth for the diarrhea.  She has had diarrheal stools for about a month.  The rectal bleeding has been sporadic but the diarrheal stool is not bloody. Hb 12.9 >> 10.9.  Hgb 13.5 in 12/2018.  MCV 98.  Platelets, INR normal Slight worsening of CKD. She was placed on Protonix drip. BPs as low as 96/79.  No tachycardia. 07/05/19 CTAP w contrast shows large amount of stool in the rectum.  2.6 cm colonic density at hepatic flexure, could represent stool but unable to exclude mass.  Severe sigmoid diverticulosis, no diverticulitis.  Cholelithiasis.  Large hiatal hernia.? Right superficial femoral vein filling defect extending to right common femoral vein.  Suggest Doppler studies for investigation.  07/06/2019 lower extremity Dopplers.  Acute DVT of right CFA, SFA junction, right femoral vein, right  proximal profunda vein, right popliteal vein right posterior tibial veins, right peroneal veins  IVC filter placed 1/5.  Patient had at least 2 stools documented yesterday but no mention of appearance or texture. Pts dementia makes it hard to get an accurate history.  She denies pain, nausea.  Is not aware of any rectal bleeding or dark stools.   Past Medical History:  Diagnosis Date  . Asymptomatic gallstones   . BRBPR (bright red blood per rectum)   . Cataract 04/13/2018   bilateral eyes  . Cholelithiasis   . Colitis - presumed infectious origin    one ER visit   . Dementia ([place])   . Duodenitis   . Fall   . Gastritis   . GI bleed   . Hiatal hernia   . Hyperlipidemia   . Hypertension   . Osteoarthritis   . Osteoporosis   . Stroke ([place])   . Syncope     Past Surgical History:  Procedure Laterality Date  . ABDOMINAL HYSTERECTOMY     BSO- fibroids  . ABI's     normal  . dexa  1/05   osteoporosis  . ESOPHAGOGASTRODUODENOSCOPY N/A 03/21/2014   Procedure: ESOPHAGOGASTRODUODENOSCOPY (EGD);  Surgeon: Milus Banister, MD;  Location: [facility];  Service: Endoscopy;  Laterality: N/A;  .  IR IVC FILTER PLMT / S&I /IMG GUID/MOD SED  07/06/2019  . left foot brace    . WRIST FRACTURE SURGERY  2/01   R arm  . WRIST FRACTURE SURGERY Left 02/12/2018    Prior to Admission medications   Medication Sig Start Date End Date Taking? Authorizing Provider  acidophilus (RISAQUAD) CAPS capsule Take 1 capsule by mouth daily.    [provider]  Cholecalciferol (VITAMIN D-3) 1000 units CAPS Take 1 capsule See admin instructions by mouth. Take 1000 units in the morning and 2000 in the evening    [provider]  donepezil (ARICEPT) 10 MG tablet TAKE 1 TABLET (10 MG TOTAL) BY MOUTH AT BEDTIME. 06/03/19   Melvenia Beam, MD  Fexofenadine HCl (ALLERGY 24-HR PO) Take 1 capsule by mouth daily.    [provider]  hydrochlorothiazide (HYDRODIURIL) 25 MG tablet Take 1 tablet  (25 mg total) by mouth daily. 04/19/19   Tower, Wynelle Fanny, MD  hydrocortisone (ANUSOL-HC) 2.5 % rectal cream Place 1 application rectally 2 (two) times daily as needed for hemorrhoids or anal itching. 06/21/19   Tower, Wynelle Fanny, MD  losartan (COZAAR) 100 MG tablet Take 1 tablet (100 mg total) by mouth daily. 04/19/19   Tower, Wynelle Fanny, MD  memantine (NAMENDA) 10 MG tablet TAKE 1 TABLET (10 MG TOTAL) BY MOUTH 2 (TWO) TIMES DAILY. 02/21/19   Melvenia Beam, MD  metoprolol tartrate (LOPRESSOR) 25 MG tablet Take 1 tablet (25 mg total) by mouth 2 (two) times daily. 04/19/19   Tower, Wynelle Fanny, MD  Multiple Vitamins-Minerals ([employer] SPECTRAVITE PO) Take 1 capsule by mouth daily.    [provider]  potassium chloride (KLOR-CON M10) 10 MEQ tablet Take 2 tablets (20 mEq total) by mouth daily. 04/19/19   Tower, Wynelle Fanny, MD  Probiotic Product (PROBIOTIC PO) Take 1 tablet by mouth daily.    [provider]  vitamin B-12 (CYANOCOBALAMIN) 1000 MCG tablet Take 1,000 mcg by mouth daily.    [provider]    Scheduled Meds: . donepezil  10 mg Oral QHS  . hydrocortisone  25 mg Rectal BID  . memantine  10 mg Oral BID  . metoprolol tartrate  25 mg Oral BID  . pantoprazole  40 mg Oral Daily   Infusions:  PRN Meds: acetaminophen **OR** acetaminophen, lidocaine, ondansetron **OR** ondansetron (ZOFRAN) IV   Allergies as of 07/05/2019 - Review Complete 07/05/2019  Allergen Reaction Noted  . Alendronate sodium Other (See Comments) 03/05/2011  . Amlodipine besylate Hives 03/16/2007  . Calcitonin (salmon) Other (See Comments) 07/23/2010  . Plavix [clopidogrel bisulfate]  01/28/2019  . Simvastatin Other (See Comments) 03/05/2011    Family History  Problem Relation Age of Onset  . Heart attack Father   . Hypertension Father   . Heart attack Mother   . Throat cancer Brother   . Breast cancer Daughter     Social History   Socioeconomic History  . Marital status: Widowed    Spouse  name: Not on file  . Number of children: 5  . Years of education: 83  . Highest education level: Not on file  Occupational History  . Occupation: retired    Fish farm manager: RETIRED  Tobacco Use  . Smoking status: Never Smoker  . Smokeless tobacco: Never Used  Substance and Sexual Activity  . Alcohol use: No    Alcohol/week: 0.0 standard drinks  . Drug use: No  . Sexual activity: Never  Other Topics Concern  .  Not on file  Social History Narrative   Has 4 brothers, 1 sister   Lives with 2 adult grandchildren and her daughter   Has 4 daughters, 1 son   Right handed   Rare caffeine    Social Determinants of Health   Financial Resource Strain: Low Risk   . Difficulty of Paying Living Expenses: Not hard at all  Food Insecurity: No Food Insecurity  . Worried About Charity fundraiser in the Last Year: Never true  . Ran Out of Food in the Last Year: Never true  Transportation Needs: No Transportation Needs  . Lack of Transportation (Medical): No  . Lack of Transportation (Non-Medical): No  Physical Activity: Inactive  . Days of Exercise per Week: 0 days  . Minutes of Exercise per Session: 0 min  Stress: Stress Concern Present  . Feeling of Stress : To some extent  Social Connections:   . Frequency of Communication with Friends and Family: Not on file  . Frequency of Social Gatherings with Friends and Family: Not on file  . Attends Religious Services: Not on file  . Active Member of Clubs or Organizations: Not on file  . Attends Archivist Meetings: Not on file  . Marital Status: Not on file  Intimate Partner Violence: Not At Risk  . Fear of Current or Ex-Partner: No  . Emotionally Abused: No  . Physically Abused: No  . Sexually Abused: No    REVIEW OF SYSTEMS: Constitutional:  Baseline mobility not clear.   ENT:  No nose bleeds Pulm: No observed shortness of breath. CV:  No palpitations, no LE edema.  Denies chest pain GU:  No hematuria, no frequency GI: See  HPI.   Heme: No reports of unusual bleeding or excessive bruising other than the GI bleeding. Transfusions: None so far this admission. Only epic documented transfusion was in 03/2014 when she received 1 PRBC.  Neuro:  No headaches, no peripheral tingling or numbness Derm:  No itching, no rash or sores.  Endocrine:  No sweats or chills.  No polyuria or dysuria Immunization: Current on her flu vaccination.  Multiple other vaccinations appear up-to-date after review of records. Travel:  None beyond local counties in last few months.    PHYSICAL EXAM: Vital signs in last 24 hours: Vitals:   07/06/19 2051 07/07/19 0547  BP: 111/62 127/73  Pulse: 84 73  Resp: 20 19  Temp: 98 F (36.7 C) 98.4 F (36.9 C)  SpO2: 96% 98%   Wt Readings from Last 3 Encounters:  07/06/19 74.1 kg  04/19/19 74.8 kg  12/31/18 74.1 kg    General: Elderly, calm, sleepy but easily arousable.  Does not look acutely ill though she is pale. Head: No facial asymmetry or swelling.  No signs of head trauma. Eyes: No scleral icterus.  No conjunctival pallor.   Ears: Not hard of hearing Nose: No congestion, no discharge. Mouth: Full dentures in place.  Mucosa somewhat dry which is making it difficult for her to speak.  Tongue midline. Neck: JVD, no masses, no thyromegaly. Lungs: Clear bilaterally no labored breathing. Heart: RRR.  No MRG.  S1, S2 present Abdomen: Soft.  Tender on the left without guarding or rebound.  Bowel sounds quiet.  No distention.  No HSM, masses, bruits..   Rectal: Visible large external hemorrhoids, nonthrombosed.  No visible blood.  Did not perform digital exam. Musc/Skeltl: No joint swelling or gross deformity. Extremities: No CCE. Neurologic: Pleasant.  Disoriented.  Was unable  to tell me the year, place, her date of birth.  Does follow commands.  Slight upper extremity tremor.  Moves all 4 limbs.  Able to turn herself on to her side in the bed. Skin: No rashes, sores, suspicious  lesions. Nodes: No cervical adenopathy Psych: Calm, cooperative.  Intake/Output from previous day: 01/05 0701 - 01/06 0700 In: 390.8 [P.O.:360; I.V.:30.8] Out: -  Intake/Output this shift: No intake/output data recorded.  LAB RESULTS: Recent Labs    07/05/19 1139 07/06/19 1102 07/07/19 0251  WBC 6.1 4.6 5.0  HGB 12.9 11.1* 10.9*  HCT 41.4 34.6* 32.9*  PLT 212 166 159   BMET Lab Results  Component Value Date   NA 141 07/07/2019   NA 140 07/06/2019   NA 140 07/05/2019   K 3.2 (L) 07/07/2019   K 3.1 (L) 07/06/2019   K 3.7 07/05/2019   CL 107 07/07/2019   CL 104 07/06/2019   CL 104 07/05/2019   CO2 25 07/07/2019   CO2 27 07/06/2019   CO2 24 07/05/2019   GLUCOSE 88 07/07/2019   GLUCOSE 96 07/06/2019   GLUCOSE 118 (H) 07/05/2019   BUN 12 07/07/2019   BUN 12 07/06/2019   BUN 15 07/05/2019   CREATININE 1.30 (H) 07/07/2019   CREATININE 1.31 (H) 07/06/2019   CREATININE 1.28 (H) 07/05/2019   CALCIUM 9.2 07/07/2019   CALCIUM 9.5 07/06/2019   CALCIUM 10.2 07/05/2019   LFT Recent Labs    07/05/19 1139  PROT 6.7  ALBUMIN 3.4*  AST 25  ALT 12  ALKPHOS 67  BILITOT 0.5   PT/INR Lab Results  Component Value Date   INR 1.1 07/05/2019   INR 1.08 02/09/2018   INR 0.98 07/05/2017    RADIOLOGY STUDIES: CT ABDOMEN PELVIS W CONTRAST  Result Date: 07/05/2019 CLINICAL DATA:  Diverticulitis. EXAM: CT ABDOMEN AND PELVIS WITH CONTRAST TECHNIQUE: Multidetector CT imaging of the abdomen and pelvis was performed using the standard protocol following bolus administration of intravenous contrast. CONTRAST:  104mL OMNIPAQUE IOHEXOL 300 MG/ML  SOLN COMPARISON:  Nov 07, 2011 FINDINGS: Lower chest: There is presumed atelectasis at the left lung base.The heart size is borderline enlarged. Hepatobiliary: Again noted is a hemangioma in the left hepatic lobe. Cholelithiasis without acute inflammation.There is no biliary ductal dilation. Pancreas: Normal contours without ductal dilatation. No  peripancreatic fluid collection. Spleen: No splenic laceration or hematoma. Adrenals/Urinary Tract: --Adrenal glands: No adrenal hemorrhage. --Right kidney/ureter: There is an exophytic cyst arising from the right kidney measuring approximately 4.8 cm. There is no right-sided hydronephrosis or radiopaque obstructing kidney stone --Left kidney/ureter: . --Urinary bladder: Unremarkable. Stomach/Bowel: --Stomach/Duodenum: There is a large hiatal hernia. --Small bowel: No dilatation or inflammation. --Colon: There is a large amount of stool in the rectum. There is severe sigmoid diverticulosis without CT evidence for diverticulitis. There is a rounded approximately 2.6 cm density in the colon at the level of the a hepatic flexure (axial series 2, image 35). --Appendix: Not visualized. No right lower quadrant inflammation or free fluid. Vascular/Lymphatic: Atherosclerotic calcification is present within the non-aneurysmal abdominal aorta, without hemodynamically significant stenosis. There is a questionable filling defect within the right superficial femoral vein extending into the right common femoral vein. --No retroperitoneal lymphadenopathy. --No mesenteric lymphadenopathy. --No pelvic or inguinal lymphadenopathy. Reproductive: Status post hysterectomy. No adnexal mass. Other: No ascites or free air. The abdominal wall is normal. Musculoskeletal. There are degenerative changes throughout the thoracolumbar spine without evidence for an acute displaced fracture. IMPRESSION: 1. No definite  acute abnormality detected. 2. There is a questionable filling defect within the right superficial femoral vein extending into the right common femoral vein. Recommend correlation with lower extremity venous Doppler. 3. There is a rounded approximately 2.6 cm density in the colon at the level of the a hepatic flexure. While this could represent stool, a mass is not excluded. Recommend correlation with colonoscopy. 4. There is a large  amount of stool in the rectum. Severe sigmoid diverticulosis without CT evidence for diverticulitis. 5. Cholelithiasis without acute inflammation. 6. Large hiatal hernia. Aortic Atherosclerosis (ICD10-I70.0). Electronically Signed   By: Constance Holster M.D.   On: 07/05/2019 20:32   VAS [place] LOWER EXTREMITY VENOUS (DVT) (ONLY [facility] & [facility]) Result Date: 07/06/2019 Summary: Right: Findings consistent with acute deep vein thrombosis involving the right common femoral vein, SF junction, right femoral vein, right proximal profunda vein, right popliteal vein, right posterior tibial veins, and right peroneal veins. No cystic structure found in the popliteal fossa. Attempted to visualize iliac vein. Not visualized well.  *See table(s) above for measurements and observations. Electronically signed by Harold Barban MD on 07/06/2019 at 1:45:29 PM.    Final     IMPRESSION:   *  Hematochezia.    Likely from known large hemorrhoids versus colon mass versus ischemic colitis (although CT does not show colitis, she is tender on the left abdomen and her blood pressures are soft)  *   Possible mass versus stool ball at hepatic flexure. No previous colonoscopy.  *     Acute extensive right lower extremity DVT.  Not a great candidate for anticoagulation.  IR consulted for placement of IVC filter.  *    Normocytic anemia.  *    Significant dementia.    *     DNR CODE STATUS.  *     COVID-19 negative.   PLAN:     *   Stopped the IV Protonix drip.  Begin daily oral Protonix tablet in the morning. Added bid Anusol HC PR. If she develops recurrent significant diarrhea, consider sending sample for C. difficile.   *   Patient is okay for resuming meals.   Ordering soft diet  *   ? Colonoscopy vs flex sigmoidoscopy vs watch/wait.     Azucena Freed  07/07/2019, 8:30 AM Phone [phone]  GI ATTENDING  History, laboratories, x-rays, prior endoscopy reports reviewed.  Patient seen and examined.  Agree with  comprehensive consultation note as outlined above.  Very complicated case.  Patient with multiple medical problems and limited quality of life.  Advanced age.  No acute distress.  Benign abdomen.  No significant GI bleeding.  I favor supportive care.  I would not offer this patient colonoscopy given the aforementioned issues.  Okay to resume patient's diet of choice.  Treatment of hemorrhoids with Anusol suppositories twice daily.  No further GI recommendations or work-up planned.  Please call if you have any questions or new problems.  Thanks  [name]., M.D. [facility] Division of Gastroenterology

## 2019-07-07 DIAGNOSIS — K625 Hemorrhage of anus and rectum: Secondary | ICD-10-CM

## 2019-07-07 DIAGNOSIS — K641 Second degree hemorrhoids: Secondary | ICD-10-CM

## 2019-07-07 DIAGNOSIS — R935 Abnormal findings on diagnostic imaging of other abdominal regions, including retroperitoneum: Secondary | ICD-10-CM

## 2019-07-07 DIAGNOSIS — R197 Diarrhea, unspecified: Secondary | ICD-10-CM

## 2019-07-07 DIAGNOSIS — R195 Other fecal abnormalities: Secondary | ICD-10-CM

## 2019-07-07 LAB — BASIC METABOLIC PANEL
Anion gap: 9 (ref 5–15)
BUN: 12 mg/dL (ref 8–23)
CO2: 25 mmol/L (ref 22–32)
Calcium: 9.2 mg/dL (ref 8.9–10.3)
Chloride: 107 mmol/L (ref 98–111)
Creatinine, Ser: 1.3 mg/dL — ABNORMAL HIGH (ref 0.44–1.00)
GFR calc Af Amer: 43 mL/min — ABNORMAL LOW (ref >60)
GFR calc non Af Amer: 37 mL/min — ABNORMAL LOW (ref >60)
Glucose, Bld: 88 mg/dL (ref 70–99)
Potassium: 3.2 mmol/L — ABNORMAL LOW (ref 3.5–5.1)
Sodium: 141 mmol/L (ref 135–145)

## 2019-07-07 LAB — URINALYSIS, ROUTINE W REFLEX MICROSCOPIC
Bilirubin Urine: NEGATIVE
Glucose, UA: NEGATIVE mg/dL
Hgb urine dipstick: NEGATIVE
Ketones, ur: NEGATIVE mg/dL
Nitrite: NEGATIVE
Protein, ur: NEGATIVE mg/dL
Specific Gravity, Urine: 1.02 (ref 1.005–1.030)
pH: 5 (ref 5.0–8.0)

## 2019-07-07 LAB — CBC
HCT: 32.9 % — ABNORMAL LOW (ref 36.0–46.0)
Hemoglobin: 10.9 g/dL — ABNORMAL LOW (ref 12.0–15.0)
MCH: 31 pg (ref 26.0–34.0)
MCHC: 33.1 g/dL (ref 30.0–36.0)
MCV: 93.5 fL (ref 80.0–100.0)
Platelets: 159 10*3/uL (ref 150–400)
RBC: 3.52 MIL/uL — ABNORMAL LOW (ref 3.87–5.11)
RDW: 12.7 % (ref 11.5–15.5)
WBC: 5 10*3/uL (ref 4.0–10.5)
nRBC: 0 % (ref 0.0–0.2)

## 2019-07-07 LAB — MAGNESIUM: Magnesium: 1.5 mg/dL — ABNORMAL LOW (ref 1.7–2.4)

## 2019-07-07 MED ORDER — POTASSIUM CHLORIDE CRYS ER 20 MEQ PO TBCR
40.0000 meq | EXTENDED_RELEASE_TABLET | Freq: Once | ORAL | Status: AC
  Administered 2019-07-07: 08:00:00 40 meq via ORAL
  Filled 2019-07-07: qty 2

## 2019-07-07 MED ORDER — MAGNESIUM SULFATE 2 GM/50ML IV SOLN
2.0000 g | Freq: Once | INTRAVENOUS | Status: AC
  Administered 2019-07-07: 15:00:00 2 g via INTRAVENOUS
  Filled 2019-07-07: qty 50

## 2019-07-07 NOTE — Progress Notes (Signed)
Patient fidgeting in bed. Pulled iv out of arm. Site intact. Patient not currently using it so will leave out for now until further notice.

## 2019-07-07 NOTE — Progress Notes (Signed)
PROGRESS NOTE    Wendy Krueger  [id number] DOB: 1930-03-06 DOA: 07/05/2019 PCP: Abner Greenspan, MD   Brief Narrative:  HPI on 07/05/2019 by Dr. Jennette Kettle Wendy Krueger is a 86 y.o. female with medical history significant of dementia, prior GI bleed, HTN. Not on anticoagulants. Patient has been having intermittent diarrhea for the past several weeks.  Intermittent c/o abd pain with this.  Family tried giving her peptobismol for a couple of days, last on Sat. Today patient with BRBPR, not clear if from hemorrhoids or not.  Patient with pain and AMS with this.  Pt brought in to ED.  Interim history Admitted with GI bleed.  FOBT was positive.  Hemoglobin currently stable.  Currently on Protonix drip.  Gastroenterology consulted and appreciated.  Assessment & Plan   GI bleed -FOBT was positive, patient was noted to have bright red blood per rectum, however per H&P, patient was noted to have black stool and she has recently been on Pepto-Bismol. -?  Question hemorrhoidal bleeding vs diverticular bleed (patient with diverticulosis without diverticulitis on imaging) -CT abdomen pelvis showed no definite acute abnormality.  Questionable filling defect in the right superficial femoral vein extending into the right common femoral vein, lower extremity Doppler recommended.  2.6 cm density in the colon at the level of hepatic flexure could represent stool, mass not excluded.  Large amount of stool in the rectum.  Severe sigmoid diverticulosis. -Hemoglobin currently 10.9 -Was on protonix drip and transition to oral -Gastroenterology consulted and appreciated and pending recommendations  Right lower extremity DVT -Noted on CT scan as above -Lower extremity Doppler: Acute DVT involving the right common femoral vein, SF junction, right proximal vein, right proximal profunda vein, right popliteal vein, right posterior tibial veins, right peroneal vein. -Given possible GI bleed, IR consulted  for IVC filter -S/p basement of a retrievable IVC filter via right IJ approach. IR recommended no submersion for 7 days. Okay for anticoagulation in 3 to 6 months and can be referred to [place] clinic to reevaluate for filter removal.  Essential hypertension -Given possible GI bleed, losartan HCTZ currently held -Continue metoprolol  Dementia -Appears stable continue Namenda and Aricept  Acute metabolic encephalopathy -Secondary to the above, unknown baseline -UA showed few bacteria, 21-50 WBC, moderate leukocytes -COVID negative -Lung exam unremarkable -Currently afebrile with no leukocytosis  Hypokalemia -Will replace potassium and monitor BMP  Hypomagnesemia -magnesium 1.5, will replace  DVT Prophylaxis left SCD  Code Status: DNR  Family Communication: None at bedside  Disposition Plan: Admitted. Pending GI and IR consult. Dispo TBD>   Consultants Gastroenterology Interventional radiology  Procedures  Right lower extremity Doppler retrievable IVC filter via right IJ approach  Antibiotics   Anti-infectives (From admission, onward)   None      Subjective:   Wendy Krueger seen and examined today.  Patient with dementia.  Has no complaints this morning.    Objective:   Vitals:   07/06/19 0533 07/06/19 1628 07/06/19 2051 07/07/19 0547  BP: (!) 104/58 96/79 111/62 127/73  Pulse: 67 73 84 73  Resp: 16 16 20 19   Temp: 97.7 F (36.5 C) 97.7 F (36.5 C) 98 F (36.7 C) 98.4 F (36.9 C)  TempSrc:  Axillary Oral Oral  SpO2: 100% 97% 96% 98%  Weight:        Intake/Output Summary (Last 24 hours) at 07/07/2019 1246 Last data filed at 07/06/2019 1300 Gross per 24 hour  Intake 30.76 ml  Output --  Net  30.76 ml   Filed Weights   07/05/19 2230 07/06/19 0150  Weight: 74.8 kg 74.1 kg   Exam  General: Well developed, well nourished, NAD, appears stated age  81: NCAT, mucous membranes moist.   Cardiovascular: S1 S2 auscultated, RRR  Respiratory: Clear to  auscultation bilaterally with equal chest rise  Abdomen: Soft, nontender, nondistended, + bowel sounds  Extremities: warm dry without cyanosis clubbing or edema of LLE. RLE edema.  Neuro: AAOx1 (self only), nonfocal  Psych: Pleasantly confused  Data Reviewed: I have personally reviewed following labs and imaging studies  CBC: Recent Labs  Lab 07/05/19 1139 07/06/19 1102 07/07/19 0251  WBC 6.1 4.6 5.0  HGB 12.9 11.1* 10.9*  HCT 41.4 34.6* 32.9*  MCV 98.3 95.3 93.5  PLT 212 166 [id number]   Basic Metabolic Panel: Recent Labs  Lab 07/05/19 1139 07/06/19 1102 07/07/19 0251  NA 140 140 141  K 3.7 3.1* 3.2*  CL 104 104 107  CO2 24 27 25   GLUCOSE 118* 96 88  BUN 15 12 12   CREATININE 1.28* 1.31* 1.30*  CALCIUM 10.2 9.5 9.2  MG  --   --  1.5*   GFR: Estimated Creatinine Clearance: 27.4 mL/min (A) (by C-G formula based on SCr of 1.3 mg/dL (H)). Liver Function Tests: Recent Labs  Lab 07/05/19 1139  AST 25  ALT 12  ALKPHOS 67  BILITOT 0.5  PROT 6.7  ALBUMIN 3.4*   No results for input(s): LIPASE, AMYLASE in the last 168 hours. No results for input(s): AMMONIA in the last 168 hours. Coagulation Profile: Recent Labs  Lab 07/05/19 1825  INR 1.1   Cardiac Enzymes: No results for input(s): CKTOTAL, CKMB, CKMBINDEX, TROPONINI in the last 168 hours. BNP (last 3 results) No results for input(s): PROBNP in the last 8760 hours. HbA1C: No results for input(s): HGBA1C in the last 72 hours. CBG: No results for input(s): GLUCAP in the last 168 hours. Lipid Profile: No results for input(s): CHOL, HDL, LDLCALC, TRIG, CHOLHDL, LDLDIRECT in the last 72 hours. Thyroid Function Tests: No results for input(s): TSH, T4TOTAL, FREET4, T3FREE, THYROIDAB in the last 72 hours. Anemia Panel: No results for input(s): VITAMINB12, FOLATE, FERRITIN, TIBC, IRON, RETICCTPCT in the last 72 hours. Urine analysis:    Component Value Date/Time   COLORURINE YELLOW 07/07/2019 1033   APPEARANCEUR  CLEAR 07/07/2019 1033   LABSPEC 1.020 07/07/2019 1033   PHURINE 5.0 07/07/2019 1033   GLUCOSEU NEGATIVE 07/07/2019 1033   HGBUR NEGATIVE 07/07/2019 1033   BILIRUBINUR NEGATIVE 07/07/2019 1033   BILIRUBINUR Negative 02/18/2019 [place] 07/07/2019 1033   PROTEINUR NEGATIVE 07/07/2019 1033   UROBILINOGEN 1.0 02/18/2019 1658   UROBILINOGEN 0.2 03/20/2014 0753   NITRITE NEGATIVE 07/07/2019 1033   LEUKOCYTESUR MODERATE (A) 07/07/2019 1033   Sepsis Labs: @LABRCNTIP (procalcitonin:4,lacticidven:4)  ) Recent Results (from the past 240 hour(s))  Novel Coronavirus, NAA ([facility])     Status: None   Collection Time: 06/29/19  2:37 PM   Specimen: Nasopharyngeal(NP) swabs in vial transport medium   NASOPHARYNGE  TESTING  Result Value Ref Range Status   SARS-CoV-2, NAA Not Detected Not Detected Final    Comment: This nucleic acid amplification test was developed and its performance characteristics determined by [employer]. Nucleic acid amplification tests include PCR and TMA. This test has not been FDA cleared or approved. This test has been authorized by FDA under an Emergency Use Authorization (EUA). This test is only authorized for the duration of time the declaration  that circumstances exist justifying the authorization of the emergency use of in vitro diagnostic tests for detection of SARS-CoV-2 virus and/or diagnosis of COVID-19 infection under section 564(b)(1) of the Act, 21 U.S.C. [id number]) (1), unless the authorization is terminated or revoked sooner. When diagnostic testing is negative, the possibility of a false negative result should be considered in the context of a patient's recent exposures and the presence of clinical signs and symptoms consistent with COVID-19. An individual without symptoms of COVID-19 and who is not shedding SARS-CoV-2 virus would  expect to have a negative (not detected) result in this assay.   SARS CORONAVIRUS 2 (TAT 6-24  HRS) Nasopharyngeal Nasopharyngeal Swab     Status: None   Collection Time: 07/06/19 12:30 AM   Specimen: Nasopharyngeal Swab  Result Value Ref Range Status   SARS Coronavirus 2 NEGATIVE NEGATIVE Final    Comment: (NOTE) SARS-CoV-2 target nucleic acids are NOT DETECTED. The SARS-CoV-2 RNA is generally detectable in upper and lower respiratory specimens during the acute phase of infection. Negative results do not preclude SARS-CoV-2 infection, do not rule out co-infections with other pathogens, and should not be used as the sole basis for treatment or other patient management decisions. Negative results must be combined with clinical observations, patient history, and epidemiological information. The expected result is Negative. Fact Sheet for Patients: SugarRoll.be Fact Sheet for Healthcare Providers: https://www.woods-mathews.com/ This test is not yet approved or cleared by the [place] FDA and  has been authorized for detection and/or diagnosis of SARS-CoV-2 by FDA under an Emergency Use Authorization (EUA). This EUA will remain  in effect (meaning this test can be used) for the duration of the COVID-19 declaration under Section 56 4(b)(1) of the Act, 21 U.S.C. section 360bbb-3(b)(1), unless the authorization is terminated or revoked sooner. Performed at [facility], [place] [address] [place], [place] [address]       Radiology Studies: CT ABDOMEN PELVIS W CONTRAST  Result Date: 07/05/2019 CLINICAL DATA:  Diverticulitis. EXAM: CT ABDOMEN AND PELVIS WITH CONTRAST TECHNIQUE: Multidetector CT imaging of the abdomen and pelvis was performed using the standard protocol following bolus administration of intravenous contrast. CONTRAST:  36mL OMNIPAQUE IOHEXOL 300 MG/ML  SOLN COMPARISON:  Nov 07, 2011 FINDINGS: Lower chest: There is presumed atelectasis at the left lung base.The heart size is borderline enlarged. Hepatobiliary: Again noted  is a hemangioma in the left hepatic lobe. Cholelithiasis without acute inflammation.There is no biliary ductal dilation. Pancreas: Normal contours without ductal dilatation. No peripancreatic fluid collection. Spleen: No splenic laceration or hematoma. Adrenals/Urinary Tract: --Adrenal glands: No adrenal hemorrhage. --Right kidney/ureter: There is an exophytic cyst arising from the right kidney measuring approximately 4.8 cm. There is no right-sided hydronephrosis or radiopaque obstructing kidney stone --Left kidney/ureter: . --Urinary bladder: Unremarkable. Stomach/Bowel: --Stomach/Duodenum: There is a large hiatal hernia. --Small bowel: No dilatation or inflammation. --Colon: There is a large amount of stool in the rectum. There is severe sigmoid diverticulosis without CT evidence for diverticulitis. There is a rounded approximately 2.6 cm density in the colon at the level of the a hepatic flexure (axial series 2, image 35). --Appendix: Not visualized. No right lower quadrant inflammation or free fluid. Vascular/Lymphatic: Atherosclerotic calcification is present within the non-aneurysmal abdominal aorta, without hemodynamically significant stenosis. There is a questionable filling defect within the right superficial femoral vein extending into the right common femoral vein. --No retroperitoneal lymphadenopathy. --No mesenteric lymphadenopathy. --No pelvic or inguinal lymphadenopathy. Reproductive: Status post hysterectomy. No adnexal mass. Other: No  ascites or free air. The abdominal wall is normal. Musculoskeletal. There are degenerative changes throughout the thoracolumbar spine without evidence for an acute displaced fracture. IMPRESSION: 1. No definite acute abnormality detected. 2. There is a questionable filling defect within the right superficial femoral vein extending into the right common femoral vein. Recommend correlation with lower extremity venous Doppler. 3. There is a rounded approximately 2.6 cm  density in the colon at the level of the a hepatic flexure. While this could represent stool, a mass is not excluded. Recommend correlation with colonoscopy. 4. There is a large amount of stool in the rectum. Severe sigmoid diverticulosis without CT evidence for diverticulitis. 5. Cholelithiasis without acute inflammation. 6. Large hiatal hernia. Aortic Atherosclerosis (ICD10-I70.0). Electronically Signed   By: Constance Holster M.D.   On: 07/05/2019 20:32   IR IVC FILTER PLMT / S&I Burke Keels GUID/MOD SED  Result Date: 07/06/2019 INDICATION: 86 year old female with a history of right lower extremity DVT, gastrointestinal hemorrhage, referred for IVC filter placement EXAM: ULTRASOUND-GUIDED ACCESS RIGHT INTERNAL JUGULAR VEIN CAVAGRAM PLACEMENT OF RETRIEVABLE IVC FILTER MEDICATIONS: None. ANESTHESIA/SEDATION: None FLUOROSCOPY TIME:  Fluoroscopy Time: 2 minutes 6 seconds (55 mGy). COMPLICATIONS: None PROCEDURE: The procedure, risks, benefits, and alternatives were explained to the patient and the patient's family. Specific risks discussed include bleeding, infection, contrast reaction, renal failure, IVC filter fracture, migration, iliocaval thrombus (3% incidence), need for further procedure, need for further surgery, pulmonary embolism, cardiopulmonary collapse, death. Questions regarding the procedure were encouraged and answered. The patient understands and consents to the procedure. Ultrasound survey was performed with images stored and sent to PACs. The right neck was prepped with chlorhexidine in a sterile fashion, and a sterile drape was applied covering the operative field. A sterile gown and sterile gloves were used for the procedure. Local anesthesia was provided with 1% Lidocaine. A micropuncture needle was used access the right internal jugular vein under ultrasound. With excellent venous blood flow returned, and an .018 micro wire was passed through the needle. Small incision was made with an 11 blade  scalpel. The needle was removed, and a micropuncture sheath was placed over the wire. The inner dilator and wire were removed, and an 035 Bentson wire was advanced under fluoroscopy into the IVC. Serial dilation of the soft tissue tract was performed with an 8 [place] dilator and subsequently a 10 [place] dilator. The delivery sheath for a retrievable Bard Denali filter was passed over the Bentson wire into the IVC. The wire was removed and small contrast was used to confirm IVC location. IVC cavagram performed with CO2. Dilator was removed, and the IVC filter was then delivered, positioned below the lowest renal vein. Repeat cavagram performed, and the catheter was removed. Manual pressure was used for hemostasis. Patient tolerated the procedure well and remained hemodynamically stable throughout. No complications were encountered and no significant blood loss was encounter. IMPRESSION: Status post ultrasound-guided right internal jugular vein access for retrievable IVC filter placement. Signed, Dulcy Fanny. Dellia Nims, RPVI [facility] Radiology PLAN: This IVC filter is potentially retrievable. The patient can be assessed for filter retrieval by Interventional Radiology in approximately 8-12 weeks. Further recommendations regarding filter retrieval, continued surveillance or declaration of device permanence, can be made at that time Electronically Signed   By: Corrie Mckusick D.O.   On: 07/06/2019 16:21   VAS [place] LOWER EXTREMITY VENOUS (DVT) (ONLY [facility] & [facility])  Result Date: 07/06/2019  Lower Venous Study Indications: Pain.  Comparison Study: no  prior Performing Technologist: Abram Sander RVS  Examination Guidelines: A complete evaluation includes B-mode imaging, spectral Doppler, color Doppler, and power Doppler as needed of all accessible portions of each vessel. Bilateral testing is considered an integral part of a complete examination. Limited examinations for reoccurring  indications may be performed as noted.  +---------+---------------+---------+-----------+----------+--------------+ RIGHT    CompressibilityPhasicitySpontaneityPropertiesThrombus Aging +---------+---------------+---------+-----------+----------+--------------+ CFV      None           No       No                                  +---------+---------------+---------+-----------+----------+--------------+ SFJ      None                                                        +---------+---------------+---------+-----------+----------+--------------+ FV Prox  None                                                        +---------+---------------+---------+-----------+----------+--------------+ FV Mid   None                                                        +---------+---------------+---------+-----------+----------+--------------+ FV DistalNone                                                        +---------+---------------+---------+-----------+----------+--------------+ PFV      None                                                        +---------+---------------+---------+-----------+----------+--------------+ POP      None           No       No                                  +---------+---------------+---------+-----------+----------+--------------+ PTV      None                                                        +---------+---------------+---------+-----------+----------+--------------+ PERO     None                                                        +---------+---------------+---------+-----------+----------+--------------+   +----+---------------+---------+-----------+----------+--------------+  LEFTCompressibilityPhasicitySpontaneityPropertiesThrombus Aging +----+---------------+---------+-----------+----------+--------------+ CFV Full           Yes      Yes                                  +----+---------------+---------+-----------+----------+--------------+     Summary: Right: Findings consistent with acute deep vein thrombosis involving the right common femoral vein, SF junction, right femoral vein, right proximal profunda vein, right popliteal vein, right posterior tibial veins, and right peroneal veins. No cystic structure found in the popliteal fossa. Attempted to visualize iliac vein. Not visualized well.  *See table(s) above for measurements and observations. Electronically signed by Harold Barban MD on 07/06/2019 at 1:45:29 PM.    Final      Scheduled Meds: . donepezil  10 mg Oral QHS  . hydrocortisone  25 mg Rectal BID  . memantine  10 mg Oral BID  . metoprolol tartrate  25 mg Oral BID  . pantoprazole  40 mg Oral Daily   Continuous Infusions:    LOS: 1 day   Time Spent in minutes   45 minutes  Jahfari Ambers D.O. on 07/07/2019 at 12:46 PM  Between 7am to 7pm - Please see pager noted on [email or web address]  After 7pm go to [email or web address]  And look for the night coverage person covering for me after hours  [facility] Office  [phone]

## 2019-07-07 NOTE — TOC Initial Note (Signed)
Transition of Care [facility]) - Initial/Assessment Note    Patient Details  Name: Wendy Krueger MRN: [id number] Date of Birth: [birth year]  Transition [facility] CM/SW Contact:    Alexander Mt, [place] Phone Number: 07/07/2019, 5:34 PM  Clinical Narrative:                 CSW spoke with pt daughter [name] at bedside. I had previously contacted her on cell phone and confirmed she was at hospital visiting. Introduced self, role, reason for visit. Pt from home with daughter [name]. She has multiple children that rotate providing assistance and PCS 5 hrs x day. Pt has a modified home with elevated toilets, grab bars, and otherwise has walker and wheelchair. CSW explained role and that we assist as needed with recommended services. Pt family really hoping that they can get some answers from medical staff regarding this new diarrhea and bleeding from GI or other medical staff. Family requests calls from MD if they round prior to visiting hours as pt cannot tell them any updates due to mental status.  TOC team continues to follow.   Expected Discharge Plan: [place] Barriers to Discharge: Continued Medical Work up   Patient Goals and CMS Choice Patient states their goals for this hospitalization and ongoing recovery are:: for [place] to figure out where the bleeding and diahrrea is coming from and how to fix it   Choice offered to / list presented to : Adult Children  Expected Discharge Plan and Services Expected Discharge Plan: [place] In-house Referral: Clinical Social Work Discharge Planning Services: CM Consult Living arrangements for the past 2 months: [place]  Prior Living Arrangements/Services Living arrangements for the past 2 months: Single Family Home Lives with:: Adult Children, Relatives Patient language and need for interpreter reviewed:: Yes(no needs) Do you feel safe going back to the place where you live?: Yes      Need for  Family Participation in Patient Care: Yes (Comment)(assistance/supervision) Care giver support system in place?: Yes (comment)(pt children) Current home services: DME, Homehealth aide Criminal Activity/Legal Involvement Pertinent to Current Situation/Hospitalization: No - Comment as needed  Permission Sought/Granted Permission sought to share information with : Family Supports Permission granted to share information with : No(pt with fluctuating orientation)  Share Information with NAME: [name] (all children on facesheet are okay to contact)     Permission granted to share info w Relationship: daughter  Permission granted to share info w Contact Information: [phone]  Emotional Assessment Appearance:: Appears stated age Attitude/Demeanor/Rapport: Other (comment)(assessment completed with pt daughter due to pt lethargy/confusion) Affect (typically observed): Other (comment)(assessment completed with pt daughter due to pt lethargy/confusion) Orientation: : Oriented to Self, Fluctuating Orientation (Suspected and/or reported Sundowners) Alcohol / Substance Use: Not Applicable Psych Involvement: No (comment)  Admission diagnosis:  Melena [K92.1] GI bleed [K92.2] Patient Active Problem List   Diagnosis Date Noted  . GI bleed 07/06/2019  . Melena 07/05/2019  . Transient alteration of awareness 07/14/2018  . Chronic diarrhea 02/17/2018  . History of fracture of radius 02/17/2018  . Hypokalemia 02/09/2018  . History of lower GI bleeding 02/09/2018  . Acute encephalopathy 05/08/2017  . Cerebellar stroke ([place]) 03/10/2016  . Carotid aneurysm, right ([place]) 02/21/2016  . Lipoma of shoulder 03/27/2015  . History of closed Colles' fracture 09/06/2014  . Allergic rhinitis 06/20/2014  . Diastolic dysfunction [id number]  . History of GI bleed 03/21/2014  . BRBPR (bright red blood  per rectum) 03/19/2014  . Dementia ([place]) 09/01/2013  . Dementia, senile ([place]) 09/01/2013  . Anxiety  09/01/2013  . Encounter for Medicare annual wellness exam 01/08/2013  . Gallstones 11/11/2011  . 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 Risk for falls 06/04/2011  . Constipation 03/05/2011  . Vitamin D deficiency 03/28/2009  . HYPERCHOLESTEROLEMIA, PURE 03/17/2007  . Essential hypertension 03/16/2007  . OSTEOARTHRITIS 03/16/2007  . Osteoporosis 03/16/2007   PCP:  Abner Greenspan, MD Pharmacy:   [id number] - [place], [place] [address] [place] [address] Phone: [phone] Fax: [phone]   Readmission Risk Interventions Readmission Risk Prevention Plan 07/07/2019  Post Dischage Appt Not Complete  Appt Comments pending stability  Medication Screening Complete  Transportation Screening Complete  Some recent data might be hidden

## 2019-07-07 NOTE — Consult Note (Signed)
   [facility] CM Inpatient Consult   07/07/2019  Wendy Krueger 04/22/1930 [id number]    THN: Active Status   Patient is currently active with [facility] for chronic disease management services. Patient has been engaged by a [facility] for continued disease management education for hypertension.  Patient checked for risk of unplanned readmission and hospitalization under her Medicare/ [employer] insurance plan.   Per MD brief narrative, patient is 86 y.o.femalewith medical history significant ofdementia, prior GI bleed, HTN. Not on anticoagulants.  She was admitted with GI bleed, right lower extremity DVT, acute metabolic encephalopathy. Underwent placement of a retrievable IVC filter via right IJ approach.   Discharge disposition still to be determined.  Plan: Will follow for progression and disposition recommendation for post hospital transition.  Will reassign patient for disease management post discharge as appropriate.   Her primary care provider is Loura Pardon, MD with [place] [facility] , listed as providing the transition of care follow up.  Of note, [facility] Care Management services does not replace or interfere with any services that are needed or arranged by inpatient [facility] care management team.    For additional questions, please contact:  Edwena Felty A. Bahja Bence, BSN, RN-BC [facility] Liaison Cell: [phone]

## 2019-07-08 ENCOUNTER — Other Ambulatory Visit: Payer: Self-pay | Admitting: *Deleted

## 2019-07-08 LAB — BASIC METABOLIC PANEL
Anion gap: 9 (ref 5–15)
BUN: 17 mg/dL (ref 8–23)
CO2: 24 mmol/L (ref 22–32)
Calcium: 8.9 mg/dL (ref 8.9–10.3)
Chloride: 109 mmol/L (ref 98–111)
Creatinine, Ser: 1.26 mg/dL — ABNORMAL HIGH (ref 0.44–1.00)
GFR calc Af Amer: 44 mL/min — ABNORMAL LOW (ref >60)
GFR calc non Af Amer: 38 mL/min — ABNORMAL LOW (ref >60)
Glucose, Bld: 101 mg/dL — ABNORMAL HIGH (ref 70–99)
Potassium: 3.5 mmol/L (ref 3.5–5.1)
Sodium: 142 mmol/L (ref 135–145)

## 2019-07-08 LAB — CBC
HCT: 30.6 % — ABNORMAL LOW (ref 36.0–46.0)
Hemoglobin: 9.7 g/dL — ABNORMAL LOW (ref 12.0–15.0)
MCH: 30.5 pg (ref 26.0–34.0)
MCHC: 31.7 g/dL (ref 30.0–36.0)
MCV: 96.2 fL (ref 80.0–100.0)
Platelets: 175 10*3/uL (ref 150–400)
RBC: 3.18 MIL/uL — ABNORMAL LOW (ref 3.87–5.11)
RDW: 13 % (ref 11.5–15.5)
WBC: 4.9 10*3/uL (ref 4.0–10.5)
nRBC: 0 % (ref 0.0–0.2)

## 2019-07-08 LAB — MAGNESIUM: Magnesium: 2.1 mg/dL (ref 1.7–2.4)

## 2019-07-08 MED ORDER — POTASSIUM CHLORIDE 20 MEQ/15ML (10%) PO SOLN
40.0000 meq | Freq: Every day | ORAL | Status: DC
  Administered 2019-07-08 – 2019-07-12 (×5): 40 meq via ORAL
  Filled 2019-07-08 (×5): qty 30

## 2019-07-08 NOTE — Patient Outreach (Signed)
[facility]) Care Management  07/08/2019  Wendy Krueger 09-08-1929 [id number]   RN Health Coach discipline closure. Patient has been admitted into hospital. Patient will be followed by complex care coordinator when discharged.  Plan: Discipline closure letter sent to PCP  [facility] [phone]

## 2019-07-08 NOTE — Evaluation (Signed)
Physical Therapy Evaluation Patient Details Name: Wendy Krueger MRN: [id number] DOB: 01-08-1930 Today's Date: 07/08/2019   History of Present Illness  86 y/o admitted with GI bleed after beign admitted with diarrhea.  She was found to have R LE DVT and s/p IVC filter placement (07/06/19).  PMH: dementia, CVA, HTN, asymptomatic gallstones  Clinical Impression  Pt admitted with above diagnosis. Pt currently with functional limitations due to the deficits listed below (see PT Problem List). Pt will benefit from skilled PT to increase their independence and safety with mobility to allow discharge to the venue listed below.  Pt with deconditioning and not walking as well as she normally does and recommend HHPT to work towards improving functional strength to decrease fall risk. She will have 24 hour A with family.     Follow Up Recommendations Home health PT;Supervision/Assistance - 24 hour    Equipment Recommendations  None recommended by PT    Recommendations for Other Services       Precautions / Restrictions Precautions Precautions: Fall Restrictions Weight Bearing Restrictions: No      Mobility  Bed Mobility Overal bed mobility: Needs Assistance Bed Mobility: Rolling;Sidelying to Sit Rolling: Min guard Sidelying to sit: Min assist       General bed mobility comments: MIN A to get trunk upright.  Transfers Overall transfer level: Needs assistance Equipment used: Rolling walker (2 wheeled) Transfers: Sit to/from Stand Sit to Stand: Min guard         General transfer comment: min/guard for safety/steadying  Ambulation/Gait Ambulation/Gait assistance: Min guard Gait Distance (Feet): 50 Feet Assistive device: Rolling walker (2 wheeled) Gait Pattern/deviations: Step-through pattern;Decreased step length - right;Decreased step length - left;Trunk flexed Gait velocity: decreased   General Gait Details: Pt ambulated laps in room with slow cadence and trunk flexed.  MIN/guard for safety and verbal cues with safe turning. She was able to negotiate tight turns with stepping backwards with no LOB with use of RW.  Stairs            Wheelchair Mobility    Modified Rankin (Stroke Patients Only)       Balance Overall balance assessment: Needs assistance Sitting-balance support: Feet unsupported;Feet supported Sitting balance-Leahy Scale: Fair     Standing balance support: Bilateral upper extremity supported Standing balance-Leahy Scale: Poor                               Pertinent Vitals/Pain Pain Assessment: Faces Faces Pain Scale: No hurt    Home Living Family/patient expects to be discharged to:: Private residence Living Arrangements: Children Available Help at Discharge: Family;Available 24 hours/day Type of Home: House Home Access: Ramped entrance     Home Layout: One level Home Equipment: Walker - 2 wheels;Cane - single point;Bedside commode;Shower seat;Grab bars - tub/shower;Hand held shower head;Transport chair Additional Comments: bed alarm    Prior Function Level of Independence: Needs assistance   Gait / Transfers Assistance Needed: Amb with RW with S from family. Whenever she gets up to go the bathroom, they make her do 3 laps around the [employer].           Hand Dominance        Extremity/Trunk Assessment   Upper Extremity Assessment Upper Extremity Assessment: Overall WFL for tasks assessed    Lower Extremity Assessment Lower Extremity Assessment: Overall WFL for tasks assessed;Generalized weakness    Cervical / Trunk Assessment Cervical / Trunk Assessment:  Kyphotic  Communication   Communication: No difficulties  Cognition Arousal/Alertness: Awake/alert Behavior During Therapy: WFL for tasks assessed/performed Overall Cognitive Status: History of cognitive impairments - at baseline                                        General Comments General comments (skin  integrity, edema, etc.): Pt pleasantly confused throughout session and appreciative of the walk.    Exercises     Assessment/Plan    PT Assessment Patient needs continued PT services  PT Problem List Decreased strength;Decreased activity tolerance;Decreased balance;Decreased mobility;Decreased cognition;Decreased safety awareness       PT Treatment Interventions DME instruction;Gait training;Functional mobility training;Therapeutic exercise;Therapeutic activities;Balance training;Neuromuscular re-education;Patient/family education    PT Goals (Current goals can be found in the Care Plan section)  Acute Rehab PT Goals Patient Stated Goal: to return home PT Goal Formulation: With family Time For Goal Achievement: 07/22/19 Potential to Achieve Goals: Good    Frequency Min 3X/week   Barriers to discharge        Co-evaluation               AM-PAC PT "6 Clicks" Mobility  Outcome Measure Help needed turning from your back to your side while in a flat bed without using bedrails?: A Little Help needed moving from lying on your back to sitting on the side of a flat bed without using bedrails?: A Little Help needed moving to and from a bed to a chair (including a wheelchair)?: A Little Help needed standing up from a chair using your arms (e.g., wheelchair or bedside chair)?: A Little Help needed to walk in hospital room?: A Little Help needed climbing 3-5 steps with a railing? : A Little 6 Click Score: 18    End of Session Equipment Utilized During Treatment: Gait belt Activity Tolerance: Patient tolerated treatment well Patient left: in chair;with call bell/phone within reach;with chair alarm set Nurse Communication: Mobility status PT Visit Diagnosis: Muscle weakness (generalized) (M62.81);Difficulty in walking, not elsewhere classified (R26.2)    Time: [id number] PT Time Calculation (min) (ACUTE ONLY): 28 min   Charges:   PT Evaluation $PT Eval Moderate Complexity: 1  Mod PT Treatments $Gait Training: 8-22 mins        Wendy Krueger, [place] Pager [phone] 07/08/2019   Galen [place] 07/08/2019, 11:53 AM

## 2019-07-08 NOTE — Progress Notes (Signed)
PROGRESS NOTE    Wendy Krueger  [id number] DOB: 04-19-1930 DOA: 07/05/2019 PCP: Abner Greenspan, MD   Brief Narrative:  HPI on 07/05/2019 by Dr. Jennette Kettle Wendy Krueger is a 86 y.o. female with medical history significant of dementia, prior GI bleed, HTN. Not on anticoagulants. Patient has been having intermittent diarrhea for the past several weeks.  Intermittent c/o abd pain with this.  Family tried giving her peptobismol for a couple of days, last on Sat. Today patient with BRBPR, not clear if from hemorrhoids or not.  Patient with pain and AMS with this.  Pt brought in to ED.  Interim history Admitted with GI bleed.  FOBT was positive.  Hemoglobin currently stable.  Currently on Protonix drip.  Gastroenterology consulted and appreciated and recommended conservative management with anusol suppositories, would not offer colonoscopy at this time.  Assessment & Plan   GI bleed -FOBT was positive, patient was noted to have bright red blood per rectum, however per H&P, patient was noted to have black stool and she has recently been on Pepto-Bismol. -?  Question hemorrhoidal bleeding vs diverticular bleed (patient with diverticulosis without diverticulitis on imaging) -CT abdomen pelvis showed no definite acute abnormality.  Questionable filling defect in the right superficial femoral vein extending into the right common femoral vein, lower extremity Doppler recommended.  2.6 cm density in the colon at the level of hepatic flexure could represent stool, mass not excluded.  Large amount of stool in the rectum.  Severe sigmoid diverticulosis. -Hemoglobin currently 9.7 -Was on protonix drip and transitioned to oral -Gastroenterology consulted and appreciated, recommended continuing conservative management.  No colonoscopy at this time.  Continue Anusol suppositories.  Right lower extremity DVT -Noted on CT scan as above -Lower extremity Doppler: Acute DVT involving the right common  femoral vein, SF junction, right proximal vein, right proximal profunda vein, right popliteal vein, right posterior tibial veins, right peroneal vein. -Given possible GI bleed, IR consulted for IVC filter -S/p basement of a retrievable IVC filter via right IJ approach. IR recommended no submersion for 7 days. Okay for anticoagulation in 3 to 6 months and can be referred to [place] [facility] to reevaluate for filter removal.  Essential hypertension -Given possible GI bleed, losartan HCTZ currently held -Continue metoprolol  Dementia -Appears stable continue Namenda and Aricept  Acute metabolic encephalopathy -Secondary to the above, unknown baseline -UA showed few bacteria, 21-50 WBC, moderate leukocytes -COVID negative -Lung exam unremarkable -Currently afebrile with no leukocytosis  Hypokalemia -Resolved with replacement, will continue to monitor  Hypomagnesemia -With replacement, continue to monitor  DVT Prophylaxis left SCD  Code Status: DNR  Family Communication: None at bedside.  Daughter via phone  Disposition Plan: Admitted.  If hemoglobin remains stable, suspect discharged within 1 to 2 days, to home  Consultants Gastroenterology Interventional radiology  Procedures  Right lower extremity Doppler retrievable IVC filter via right IJ approach  Antibiotics   Anti-infectives (From admission, onward)   None      Subjective:   Wendy Krueger seen and examined today.  Patient with dementia.  Currently eating breakfast, banana.  Has no complaints.  Objective:   Vitals:   07/07/19 0547 07/07/19 1444 07/07/19 2058 07/08/19 0441  BP: 127/73 107/64 121/60 118/68  Pulse: 73 75 72 65  Resp: 19  14 15   Temp: 98.4 F (36.9 C) 97.8 F (36.6 C) 98.1 F (36.7 C) 98.2 F (36.8 C)  TempSrc: Oral  Oral Oral  SpO2: 98% 100% 100%  99%  Weight:        Intake/Output Summary (Last 24 hours) at 07/08/2019 1117 Last data filed at 07/08/2019 0815 Gross per 24 hour  Intake 270 ml   Output -  Net 270 ml   Filed Weights   07/05/19 2230 07/06/19 0150  Weight: 74.8 kg 74.1 kg   Exam  General: Well developed, well nourished, NAD, elderly, appears stated age  89: NCAT, mucous membranes moist.   Cardiovascular: S1 S2 auscultated, RRR  Respiratory: Clear to auscultation bilaterally  Abdomen: Soft, nontender, nondistended, + bowel sounds  Extremities: warm dry without cyanosis clubbing  Neuro: AAOx1 (self only), nonfocal  Psych: Pleasantly confused  Data Reviewed: I have personally reviewed following labs and imaging studies  CBC: Recent Labs  Lab 07/05/19 1139 07/06/19 1102 07/07/19 0251 07/08/19 0153  WBC 6.1 4.6 5.0 4.9  HGB 12.9 11.1* 10.9* 9.7*  HCT 41.4 34.6* 32.9* 30.6*  MCV 98.3 95.3 93.5 96.2  PLT 212 166 159 0000000   Basic Metabolic Panel: Recent Labs  Lab 07/05/19 1139 07/06/19 1102 07/07/19 0251 07/08/19 0153  NA 140 140 141 142  K 3.7 3.1* 3.2* 3.5  CL 104 104 107 109  CO2 24 27 25 24   GLUCOSE 118* 96 88 101*  BUN 15 12 12 17   CREATININE 1.28* 1.31* 1.30* 1.26*  CALCIUM 10.2 9.5 9.2 8.9  MG  --   --  1.5* 2.1   GFR: Estimated Creatinine Clearance: 28.3 mL/min (A) (by C-G formula based on SCr of 1.26 mg/dL (H)). Liver Function Tests: Recent Labs  Lab 07/05/19 1139  AST 25  ALT 12  ALKPHOS 67  BILITOT 0.5  PROT 6.7  ALBUMIN 3.4*   No results for input(s): LIPASE, AMYLASE in the last 168 hours. No results for input(s): AMMONIA in the last 168 hours. Coagulation Profile: Recent Labs  Lab 07/05/19 1825  INR 1.1   Cardiac Enzymes: No results for input(s): CKTOTAL, CKMB, CKMBINDEX, TROPONINI in the last 168 hours. BNP (last 3 results) No results for input(s): PROBNP in the last 8760 hours. HbA1C: No results for input(s): HGBA1C in the last 72 hours. CBG: No results for input(s): GLUCAP in the last 168 hours. Lipid Profile: No results for input(s): CHOL, HDL, LDLCALC, TRIG, CHOLHDL, LDLDIRECT in the last 72  hours. Thyroid Function Tests: No results for input(s): TSH, T4TOTAL, FREET4, T3FREE, THYROIDAB in the last 72 hours. Anemia Panel: No results for input(s): VITAMINB12, FOLATE, FERRITIN, TIBC, IRON, RETICCTPCT in the last 72 hours. Urine analysis:    Component Value Date/Time   COLORURINE YELLOW 07/07/2019 1033   APPEARANCEUR CLEAR 07/07/2019 1033   LABSPEC 1.020 07/07/2019 1033   PHURINE 5.0 07/07/2019 1033   GLUCOSEU NEGATIVE 07/07/2019 1033   HGBUR NEGATIVE 07/07/2019 1033   BILIRUBINUR NEGATIVE 07/07/2019 1033   BILIRUBINUR Negative 02/18/2019 [place] 07/07/2019 1033   PROTEINUR NEGATIVE 07/07/2019 1033   UROBILINOGEN 1.0 02/18/2019 1658   UROBILINOGEN 0.2 03/20/2014 0753   NITRITE NEGATIVE 07/07/2019 1033   LEUKOCYTESUR MODERATE (A) 07/07/2019 1033   Sepsis Labs: @LABRCNTIP (procalcitonin:4,lacticidven:4)  ) Recent Results (from the past 240 hour(s))  Novel Coronavirus, NAA ([facility])     Status: None   Collection Time: 06/29/19  2:37 PM   Specimen: Nasopharyngeal(NP) swabs in vial transport medium   NASOPHARYNGE  TESTING  Result Value Ref Range Status   SARS-CoV-2, NAA Not Detected Not Detected Final    Comment: This nucleic acid amplification test was developed and its performance characteristics  determined by [employer]. Nucleic acid amplification tests include PCR and TMA. This test has not been FDA cleared or approved. This test has been authorized by FDA under an Emergency Use Authorization (EUA). This test is only authorized for the duration of time the declaration that circumstances exist justifying the authorization of the emergency use of in vitro diagnostic tests for detection of SARS-CoV-2 virus and/or diagnosis of COVID-19 infection under section 564(b)(1) of the Act, 21 U.S.C. [id number]) (1), unless the authorization is terminated or revoked sooner. When diagnostic testing is negative, the possibility of a false negative  result should be considered in the context of a patient's recent exposures and the presence of clinical signs and symptoms consistent with COVID-19. An individual without symptoms of COVID-19 and who is not shedding SARS-CoV-2 virus would  expect to have a negative (not detected) result in this assay.   SARS CORONAVIRUS 2 (TAT 6-24 HRS) Nasopharyngeal Nasopharyngeal Swab     Status: None   Collection Time: 07/06/19 12:30 AM   Specimen: Nasopharyngeal Swab  Result Value Ref Range Status   SARS Coronavirus 2 NEGATIVE NEGATIVE Final    Comment: (NOTE) SARS-CoV-2 target nucleic acids are NOT DETECTED. The SARS-CoV-2 RNA is generally detectable in upper and lower respiratory specimens during the acute phase of infection. Negative results do not preclude SARS-CoV-2 infection, do not rule out co-infections with other pathogens, and should not be used as the sole basis for treatment or other patient management decisions. Negative results must be combined with clinical observations, patient history, and epidemiological information. The expected result is Negative. Fact Sheet for Patients: SugarRoll.be Fact Sheet for Healthcare Providers: https://www.woods-mathews.com/ This test is not yet approved or cleared by the [place] FDA and  has been authorized for detection and/or diagnosis of SARS-CoV-2 by FDA under an Emergency Use Authorization (EUA). This EUA will remain  in effect (meaning this test can be used) for the duration of the COVID-19 declaration under Section 56 4(b)(1) of the Act, 21 U.S.C. section 360bbb-3(b)(1), unless the authorization is terminated or revoked sooner. Performed at [facility], [place] [address] [place], [place] [address]       Radiology Studies: IR IVC FILTER PLMT / S&I Burke Keels GUID/MOD SED  Result Date: 07/06/2019 INDICATION: 86 year old female with a history of right lower extremity DVT, gastrointestinal  hemorrhage, referred for IVC filter placement EXAM: ULTRASOUND-GUIDED ACCESS RIGHT INTERNAL JUGULAR VEIN CAVAGRAM PLACEMENT OF RETRIEVABLE IVC FILTER MEDICATIONS: None. ANESTHESIA/SEDATION: None FLUOROSCOPY TIME:  Fluoroscopy Time: 2 minutes 6 seconds (55 mGy). COMPLICATIONS: None PROCEDURE: The procedure, risks, benefits, and alternatives were explained to the patient and the patient's family. Specific risks discussed include bleeding, infection, contrast reaction, renal failure, IVC filter fracture, migration, iliocaval thrombus (3% incidence), need for further procedure, need for further surgery, pulmonary embolism, cardiopulmonary collapse, death. Questions regarding the procedure were encouraged and answered. The patient understands and consents to the procedure. Ultrasound survey was performed with images stored and sent to PACs. The right neck was prepped with chlorhexidine in a sterile fashion, and a sterile drape was applied covering the operative field. A sterile gown and sterile gloves were used for the procedure. Local anesthesia was provided with 1% Lidocaine. A micropuncture needle was used access the right internal jugular vein under ultrasound. With excellent venous blood flow returned, and an .018 micro wire was passed through the needle. Small incision was made with an 11 blade scalpel. The needle was removed, and a micropuncture sheath was placed over the  wire. The inner dilator and wire were removed, and an 035 Bentson wire was advanced under fluoroscopy into the IVC. Serial dilation of the soft tissue tract was performed with an 8 [place] dilator and subsequently a 10 [place] dilator. The delivery sheath for a retrievable Bard Denali filter was passed over the Bentson wire into the IVC. The wire was removed and small contrast was used to confirm IVC location. IVC cavagram performed with CO2. Dilator was removed, and the IVC filter was then delivered, positioned below the lowest renal vein. Repeat  cavagram performed, and the catheter was removed. Manual pressure was used for hemostasis. Patient tolerated the procedure well and remained hemodynamically stable throughout. No complications were encountered and no significant blood loss was encounter. IMPRESSION: Status post ultrasound-guided right internal jugular vein access for retrievable IVC filter placement. Signed, Dulcy Fanny. Dellia Nims, RPVI [facility] Radiology PLAN: This IVC filter is potentially retrievable. The patient can be assessed for filter retrieval by Interventional Radiology in approximately 8-12 weeks. Further recommendations regarding filter retrieval, continued surveillance or declaration of device permanence, can be made at that time Electronically Signed   By: Corrie Mckusick D.O.   On: 07/06/2019 16:21   VAS [place] LOWER EXTREMITY VENOUS (DVT) (ONLY [facility] & [facility])  Result Date: 07/06/2019  Lower Venous Study Indications: Pain.  Comparison Study: no prior Performing Technologist: Abram Sander RVS  Examination Guidelines: A complete evaluation includes B-mode imaging, spectral Doppler, color Doppler, and power Doppler as needed of all accessible portions of each vessel. Bilateral testing is considered an integral part of a complete examination. Limited examinations for reoccurring indications may be performed as noted.  +---------+---------------+---------+-----------+----------+--------------+ RIGHT    CompressibilityPhasicitySpontaneityPropertiesThrombus Aging +---------+---------------+---------+-----------+----------+--------------+ CFV      None           No       No                                  +---------+---------------+---------+-----------+----------+--------------+ SFJ      None                                                        +---------+---------------+---------+-----------+----------+--------------+ FV Prox  None                                                         +---------+---------------+---------+-----------+----------+--------------+ FV Mid   None                                                        +---------+---------------+---------+-----------+----------+--------------+ FV DistalNone                                                        +---------+---------------+---------+-----------+----------+--------------+ PFV  None                                                        +---------+---------------+---------+-----------+----------+--------------+ POP      None           No       No                                  +---------+---------------+---------+-----------+----------+--------------+ PTV      None                                                        +---------+---------------+---------+-----------+----------+--------------+ PERO     None                                                        +---------+---------------+---------+-----------+----------+--------------+   +----+---------------+---------+-----------+----------+--------------+ LEFTCompressibilityPhasicitySpontaneityPropertiesThrombus Aging +----+---------------+---------+-----------+----------+--------------+ CFV Full           Yes      Yes                                 +----+---------------+---------+-----------+----------+--------------+     Summary: Right: Findings consistent with acute deep vein thrombosis involving the right common femoral vein, SF junction, right femoral vein, right proximal profunda vein, right popliteal vein, right posterior tibial veins, and right peroneal veins. No cystic structure found in the popliteal fossa. Attempted to visualize iliac vein. Not visualized well.  *See table(s) above for measurements and observations. Electronically signed by Harold Barban MD on 07/06/2019 at 1:45:29 PM.    Final      Scheduled Meds: . donepezil  10 mg Oral QHS  . hydrocortisone  25 mg Rectal BID  .  memantine  10 mg Oral BID  . metoprolol tartrate  25 mg Oral BID  . pantoprazole  40 mg Oral Daily   Continuous Infusions:    LOS: 2 days   Time Spent in minutes   45 minutes  Sharron Petruska D.O. on 07/08/2019 at 11:17 AM  Between 7am to 7pm - Please see pager noted on [email or web address]  After 7pm go to [email or web address]  And look for the night coverage person covering for me after hours  [facility] Office  [phone]

## 2019-07-09 DIAGNOSIS — K922 Gastrointestinal hemorrhage, unspecified: Secondary | ICD-10-CM

## 2019-07-09 LAB — BASIC METABOLIC PANEL
Anion gap: 9 (ref 5–15)
BUN: 19 mg/dL (ref 8–23)
CO2: 23 mmol/L (ref 22–32)
Calcium: 8.4 mg/dL — ABNORMAL LOW (ref 8.9–10.3)
Chloride: 110 mmol/L (ref 98–111)
Creatinine, Ser: 1.34 mg/dL — ABNORMAL HIGH (ref 0.44–1.00)
GFR calc Af Amer: 41 mL/min — ABNORMAL LOW (ref >60)
GFR calc non Af Amer: 36 mL/min — ABNORMAL LOW (ref >60)
Glucose, Bld: 93 mg/dL (ref 70–99)
Potassium: 3.7 mmol/L (ref 3.5–5.1)
Sodium: 142 mmol/L (ref 135–145)

## 2019-07-09 LAB — HEMOGLOBIN AND HEMATOCRIT, BLOOD
HCT: 28.9 % — ABNORMAL LOW (ref 36.0–46.0)
HCT: 31.2 % — ABNORMAL LOW (ref 36.0–46.0)
Hemoglobin: 9.2 g/dL — ABNORMAL LOW (ref 12.0–15.0)
Hemoglobin: 9.7 g/dL — ABNORMAL LOW (ref 12.0–15.0)

## 2019-07-09 LAB — MAGNESIUM: Magnesium: 1.8 mg/dL (ref 1.7–2.4)

## 2019-07-09 LAB — GLUCOSE, CAPILLARY: Glucose-Capillary: 99 mg/dL (ref 70–99)

## 2019-07-09 NOTE — Progress Notes (Signed)
PROGRESS NOTE    Wendy Krueger  [id number] DOB: Dec 25, 1929 DOA: 07/05/2019 PCP: Abner Greenspan, MD   Brief Narrative:  HPI on 07/05/2019 by Dr. Jennette Kettle Wendy Krueger is a 86 y.o. female with medical history significant of dementia, prior GI bleed, HTN. Not on anticoagulants. Patient has been having intermittent diarrhea for the past several weeks.  Intermittent c/o abd pain with this.  Family tried giving her peptobismol for a couple of days, last on Sat. Today patient with BRBPR, not clear if from hemorrhoids or not.  Patient with pain and AMS with this.  Pt brought in to ED.  Interim history Admitted with GI bleed.  FOBT was positive.  Hemoglobin currently stable.  Currently on Protonix drip.  Gastroenterology consulted and appreciated and recommended conservative management with anusol suppositories, would not offer colonoscopy at this time.  Assessment & Plan   GI bleed -FOBT was positive, patient was noted to have bright red blood per rectum, however per H&P, patient was noted to have black stool and she has recently been on Pepto-Bismol. -?  Question hemorrhoidal bleeding vs diverticular bleed (patient with diverticulosis without diverticulitis on imaging) -CT abdomen pelvis showed no definite acute abnormality.  Questionable filling defect in the right superficial femoral vein extending into the right common femoral vein, lower extremity Doppler recommended.  2.6 cm density in the colon at the level of hepatic flexure could represent stool, mass not excluded.  Large amount of stool in the rectum.  Severe sigmoid diverticulosis. -Hemoglobin currently 9.2 (hemoglobin was 12.9 on admission) -Was on protonix drip and transitioned to oral -Gastroenterology consulted and appreciated, recommended continuing conservative management.  No colonoscopy at this time.  Continue Anusol suppositories. -no further episodes of rectal bleeding noted  Right lower extremity DVT -Noted on  CT scan as above -Lower extremity Doppler: Acute DVT involving the right common femoral vein, SF junction, right proximal vein, right proximal profunda vein, right popliteal vein, right posterior tibial veins, right peroneal vein. -Given possible GI bleed, IR consulted for IVC filter -S/p basement of a retrievable IVC filter via right IJ approach. IR recommended no submersion for 7 days. Okay for anticoagulation in 3 to 6 months and can be referred to [place] clinic to reevaluate for filter removal.  Essential hypertension -Given possible GI bleed, losartan HCTZ currently held -Continue metoprolol  Dementia -Appears stable continue Namenda and Aricept  Acute metabolic encephalopathy -Secondary to the above, unknown baseline -UA showed few bacteria, 21-50 WBC, moderate leukocytes -COVID negative -Lung exam unremarkable -Currently afebrile with no leukocytosis  Hypokalemia -Resolved with replacement, will continue to monitor  Hypomagnesemia -With replacement, continue to monitor  DVT Prophylaxis left SCD  Code Status: DNR  Family Communication: None at bedside.  Daughter via phone  Disposition Plan: Admitted.  If hemoglobin remains stable, suspect discharged within to home on 1/9 with [facility].  Consultants Gastroenterology Interventional radiology  Procedures  Right lower extremity Doppler retrievable IVC filter via right IJ approach  Antibiotics   Anti-infectives (From admission, onward)   None      Subjective:   Wendy Krueger seen and examined today.  Patient with dementia.  Has no complaints this morning.  Objective:   Vitals:   07/08/19 0441 07/08/19 1514 07/08/19 2028 07/09/19 0557  BP: 118/68 112/79 103/60 (!) 142/76  Pulse: 65 83 90 92  Resp: 15 16 18 18   Temp: 98.2 F (36.8 C) (!) 97.3 F (36.3 C) (!) 97.5 F (36.4 C) 98.2 F (36.8  C)  TempSrc: Oral Oral Oral Oral  SpO2: 99% 100% 95% 97%  Weight:        Intake/Output Summary (Last 24 hours) at  07/09/2019 1313 Last data filed at 07/09/2019 0930 Gross per 24 hour  Intake 820 ml  Output --  Net 820 ml   Filed Weights   07/05/19 2230 07/06/19 0150  Weight: 74.8 kg 74.1 kg   Exam  General: Well developed, well nourished, NAD, elderly  HEENT: NCAT, mucous membranes moist.   Cardiovascular: S1 S2 auscultated, RRR  Respiratory: Clear to auscultation bilaterally   Abdomen: Soft, nontender, nondistended, + bowel sounds  Extremities: warm dry without cyanosis clubbing or edema  Neuro: AAOx1 (self only), nonfocal  Psych: pleasantly confused  Data Reviewed: I have personally reviewed following labs and imaging studies  CBC: Recent Labs  Lab 07/05/19 1139 07/06/19 1102 07/07/19 0251 07/08/19 0153 07/09/19 0402  WBC 6.1 4.6 5.0 4.9  --   HGB 12.9 11.1* 10.9* 9.7* 9.2*  HCT 41.4 34.6* 32.9* 30.6* 28.9*  MCV 98.3 95.3 93.5 96.2  --   PLT 212 166 159 175  --    Basic Metabolic Panel: Recent Labs  Lab 07/05/19 1139 07/06/19 1102 07/07/19 0251 07/08/19 0153 07/09/19 0402  NA 140 140 141 142 142  K 3.7 3.1* 3.2* 3.5 3.7  CL 104 104 107 109 110  CO2 24 27 25 24 23   GLUCOSE 118* 96 88 101* 93  BUN 15 12 12 17 19   CREATININE 1.28* 1.31* 1.30* 1.26* 1.34*  CALCIUM 10.2 9.5 9.2 8.9 8.4*  MG  --   --  1.5* 2.1 1.8   GFR: Estimated Creatinine Clearance: 26.6 mL/min (A) (by C-G formula based on SCr of 1.34 mg/dL (H)). Liver Function Tests: Recent Labs  Lab 07/05/19 1139  AST 25  ALT 12  ALKPHOS 67  BILITOT 0.5  PROT 6.7  ALBUMIN 3.4*   No results for input(s): LIPASE, AMYLASE in the last 168 hours. No results for input(s): AMMONIA in the last 168 hours. Coagulation Profile: Recent Labs  Lab 07/05/19 1825  INR 1.1   Cardiac Enzymes: No results for input(s): CKTOTAL, CKMB, CKMBINDEX, TROPONINI in the last 168 hours. BNP (last 3 results) No results for input(s): PROBNP in the last 8760 hours. HbA1C: No results for input(s): HGBA1C in the last 72  hours. CBG: No results for input(s): GLUCAP in the last 168 hours. Lipid Profile: No results for input(s): CHOL, HDL, LDLCALC, TRIG, CHOLHDL, LDLDIRECT in the last 72 hours. Thyroid Function Tests: No results for input(s): TSH, T4TOTAL, FREET4, T3FREE, THYROIDAB in the last 72 hours. Anemia Panel: No results for input(s): VITAMINB12, FOLATE, FERRITIN, TIBC, IRON, RETICCTPCT in the last 72 hours. Urine analysis:    Component Value Date/Time   COLORURINE YELLOW 07/07/2019 1033   APPEARANCEUR CLEAR 07/07/2019 1033   LABSPEC 1.020 07/07/2019 1033   PHURINE 5.0 07/07/2019 [place] 07/07/2019 1033   HGBUR NEGATIVE 07/07/2019 1033   BILIRUBINUR NEGATIVE 07/07/2019 1033   BILIRUBINUR Negative 02/18/2019 [place] 07/07/2019 1033   PROTEINUR NEGATIVE 07/07/2019 1033   UROBILINOGEN 1.0 02/18/2019 1658   UROBILINOGEN 0.2 03/20/2014 0753   NITRITE NEGATIVE 07/07/2019 1033   LEUKOCYTESUR MODERATE (A) 07/07/2019 1033   Sepsis Labs: @LABRCNTIP (procalcitonin:4,lacticidven:4)  ) Recent Results (from the past 240 hour(s))  Novel Coronavirus, NAA ([facility])     Status: None   Collection Time: 06/29/19  2:37 PM   Specimen: Nasopharyngeal(NP) swabs in vial transport  medium   NASOPHARYNGE  TESTING  Result Value Ref Range Status   SARS-CoV-2, NAA Not Detected Not Detected Final    Comment: This nucleic acid amplification test was developed and its performance characteristics determined by [employer]. Nucleic acid amplification tests include PCR and TMA. This test has not been FDA cleared or approved. This test has been authorized by FDA under an Emergency Use Authorization (EUA). This test is only authorized for the duration of time the declaration that circumstances exist justifying the authorization of the emergency use of in vitro diagnostic tests for detection of SARS-CoV-2 virus and/or diagnosis of COVID-19 infection under section 564(b)(1) of the  Act, 21 U.S.C. [id number]) (1), unless the authorization is terminated or revoked sooner. When diagnostic testing is negative, the possibility of a false negative result should be considered in the context of a patient's recent exposures and the presence of clinical signs and symptoms consistent with COVID-19. An individual without symptoms of COVID-19 and who is not shedding SARS-CoV-2 virus would  expect to have a negative (not detected) result in this assay.   SARS CORONAVIRUS 2 (TAT 6-24 HRS) Nasopharyngeal Nasopharyngeal Swab     Status: None   Collection Time: 07/06/19 12:30 AM   Specimen: Nasopharyngeal Swab  Result Value Ref Range Status   SARS Coronavirus 2 NEGATIVE NEGATIVE Final    Comment: (NOTE) SARS-CoV-2 target nucleic acids are NOT DETECTED. The SARS-CoV-2 RNA is generally detectable in upper and lower respiratory specimens during the acute phase of infection. Negative results do not preclude SARS-CoV-2 infection, do not rule out co-infections with other pathogens, and should not be used as the sole basis for treatment or other patient management decisions. Negative results must be combined with clinical observations, patient history, and epidemiological information. The expected result is Negative. Fact Sheet for Patients: SugarRoll.be Fact Sheet for Healthcare Providers: https://www.woods-mathews.com/ This test is not yet approved or cleared by the [place] FDA and  has been authorized for detection and/or diagnosis of SARS-CoV-2 by FDA under an Emergency Use Authorization (EUA). This EUA will remain  in effect (meaning this test can be used) for the duration of the COVID-19 declaration under Section 56 4(b)(1) of the Act, 21 U.S.C. section 360bbb-3(b)(1), unless the authorization is terminated or revoked sooner. Performed at [facility], [place] [address], [place], [place] [address]       Radiology  Studies: No results found.   Scheduled Meds: . donepezil  10 mg Oral QHS  . hydrocortisone  25 mg Rectal BID  . memantine  10 mg Oral BID  . metoprolol tartrate  25 mg Oral BID  . pantoprazole  40 mg Oral Daily  . potassium chloride  40 mEq Oral Daily   Continuous Infusions:    LOS: 3 days   Time Spent in minutes   30 minutes  Donnabelle Blanchard D.O. on 07/09/2019 at 1:13 PM  Between 7am to 7pm - Please see pager noted on [email or web address]  After 7pm go to [email or web address]  And look for the night coverage person covering for me after hours  [facility] Office  [phone]

## 2019-07-09 NOTE — TOC Progression Note (Signed)
Transition of Care [facility]) - Progression Note    Patient Details  Name: Wendy Krueger MRN: [id number] Date of Birth: [birth year]  Transition of Care [facility]) CM/SW Contact  Jacalyn Lefevre Edson Snowball, RN Phone Number: 07/09/2019, 2:24 PM  Clinical Narrative:     See prior TOC note. Choice offered daughter and patient would like [facility]. Referral given to [facility] with [facility] and accepted. Patient has all DME and 24 hour supervision at home   Expected Discharge Plan: [place] Barriers to Discharge: Continued Medical Work up  Expected Discharge Plan and Services Expected Discharge Plan: [place] In-house Referral: Clinical Social Work Discharge Planning Services: CM Consult Post Acute Care Choice: [place] arrangements for the past 2 months: Single Family Home                 DME Arranged: N/A         HH Arranged: PT HH Agency: [place] ([facility]) Date HH Agency Contacted: 07/09/19 Time [place]: 27 Representative spoke with at [place]: [place] ([place]) Interventions    Readmission Risk Interventions Readmission Risk Prevention Plan 07/07/2019  Post Dischage Appt Not Complete  Appt Comments pending stability  Medication Screening Complete  Transportation Screening Complete  Some recent data might be hidden

## 2019-07-09 NOTE — Progress Notes (Signed)
Patient felt dizzy, clammy, diaphoretic after having a bowel movement. NT stated there was bright red blood in the toilet. Patient helped back to bed. VSS, CBG 99. After laying down with fan starting to feel better. Dr. Ree Kida made aware. New orders received. Will continue to monitor.

## 2019-07-09 NOTE — Consult Note (Signed)
   [facility] CM Inpatient Consult   07/09/2019  Wendy Krueger 1929/08/28 [id number]    Follow-up Note:  Following patient who is previously active with [facility]) Health Coach for hypertension management.   Called and spoke to patient and daughter in the room. Patient's [name]) reports that patient has dementia, permission was obtained to speak to her daughter. HIPAA information verified.  Daughter reports that plan is for patient to discharge home with home health PT to improve functional strength.  Discussedwithpatient's daughter about[facility] care management services and daughter reports having a home aide from [facility] coming 6 times a week, 5 hours/day to assist with patient's care. Patient's [name]) who lives with her, manages medications using pill box. [facility] without difficulty. [name]) provides transportation. According to daughter, patient's family take turns in assisting patient and providing 24/7 care.   Daughter expressed concern about patient's bleeding (GI) and blood clot to right lower extremity. She has called for RN to check patient for continued complaints of pain to right leg during our conversation. She also states wanting to continue to keep track in managing patient's HTN. Daughter agrees to continue to receive follow-up calls from [facility] care management coordinator after patient's discharge, to monitor recovery.    Patient's daughter endorses Dr. Loura Pardon with [facility] as primary care provider for patient, listed as providing transitionof care follow-up. She was made aware of importance for follow-up after patient's discharge.  Referral made to [facility]Ncaremanagementcoordinator for complex disease management for BP control (HTN) with changes in medication on this admission and to assessforfurther needs post discharge.  Transition of careteam outreached to  notify resumption of [facility] care management following afterdischarge.  Of note, [facility] Care Management services does not replace or interfere with any services that are arranged by transition of care case management or social work.    For questions and additional information, please call:  Anden Bartolo A. Izaak Sahr, BSN, RN-BC [facility] Liaison Cell: [phone]

## 2019-07-09 NOTE — Progress Notes (Signed)
Patient had a medium type 5 BM with urine noted to be clear red on BSC. Will monitor.

## 2019-07-10 DIAGNOSIS — K644 Residual hemorrhoidal skin tags: Secondary | ICD-10-CM

## 2019-07-10 DIAGNOSIS — R935 Abnormal findings on diagnostic imaging of other abdominal regions, including retroperitoneum: Secondary | ICD-10-CM

## 2019-07-10 LAB — HEMOGLOBIN AND HEMATOCRIT, BLOOD
HCT: 29.2 % — ABNORMAL LOW (ref 36.0–46.0)
Hemoglobin: 9.2 g/dL — ABNORMAL LOW (ref 12.0–15.0)

## 2019-07-10 LAB — MAGNESIUM: Magnesium: 1.8 mg/dL (ref 1.7–2.4)

## 2019-07-10 LAB — BASIC METABOLIC PANEL
Anion gap: 8 (ref 5–15)
BUN: 19 mg/dL (ref 8–23)
CO2: 21 mmol/L — ABNORMAL LOW (ref 22–32)
Calcium: 8.7 mg/dL — ABNORMAL LOW (ref 8.9–10.3)
Chloride: 110 mmol/L (ref 98–111)
Creatinine, Ser: 1.22 mg/dL — ABNORMAL HIGH (ref 0.44–1.00)
GFR calc Af Amer: 46 mL/min — ABNORMAL LOW (ref >60)
GFR calc non Af Amer: 40 mL/min — ABNORMAL LOW (ref >60)
Glucose, Bld: 93 mg/dL (ref 70–99)
Potassium: 4 mmol/L (ref 3.5–5.1)
Sodium: 139 mmol/L (ref 135–145)

## 2019-07-10 NOTE — Progress Notes (Signed)
PROGRESS NOTE    CONOR CAMHI  [id number] DOB: 1930/03/10 DOA: 07/05/2019 PCP: Abner Greenspan, MD   Brief Narrative:  HPI on 07/05/2019 by Dr. Jennette Kettle LITTLE CRATER is a 86 y.o. female with medical history significant of dementia, prior GI bleed, HTN. Not on anticoagulants. Patient has been having intermittent diarrhea for the past several weeks.  Intermittent c/o abd pain with this.  Family tried giving her peptobismol for a couple of days, last on Sat. Today patient with BRBPR, not clear if from hemorrhoids or not.  Patient with pain and AMS with this.  Pt brought in to ED.  Interim history Admitted with GI bleed.  FOBT was positive.  Hemoglobin currently stable.  Currently on Protonix drip.  Gastroenterology consulted and appreciated and recommended conservative management with anusol suppositories, would not offer colonoscopy at this time.  Assessment & Plan   GI bleed -FOBT was positive, patient was noted to have bright red blood per rectum, however per H&P, patient was noted to have black stool and she has recently been on Pepto-Bismol. -?  Question hemorrhoidal bleeding vs diverticular bleed (patient with diverticulosis without diverticulitis on imaging) -CT abdomen pelvis showed no definite acute abnormality.  Questionable filling defect in the right superficial femoral vein extending into the right common femoral vein, lower extremity Doppler recommended.  2.6 cm density in the colon at the level of hepatic flexure could represent stool, mass not excluded.  Large amount of stool in the rectum.  Severe sigmoid diverticulosis. -Hemoglobin currently 9.2 (hemoglobin was 12.9 on admission) -Was on protonix drip and transitioned to oral -Gastroenterology consulted and appreciated, recommended continuing conservative management.  No colonoscopy at this time.  Continue Anusol suppositories.  -Patient had a vasovagal episode after a bloody bowel movement on 07/09/19.   -hemoglobin 9.2 -Dicussed with Dr. Henrene Pastor, GI, continue supportive management. Would not scope her at this time. Continue BID dosing of anusol suppositories.  -Continue HH  Right lower extremity DVT -Noted on CT scan as above -Lower extremity Doppler: Acute DVT involving the right common femoral vein, SF junction, right proximal vein, right proximal profunda vein, right popliteal vein, right posterior tibial veins, right peroneal vein. -Given possible GI bleed, IR consulted for IVC filter -S/p basement of a retrievable IVC filter via right IJ approach. IR recommended no submersion for 7 days. Okay for anticoagulation in 3 to 6 months and can be referred to [place] clinic to reevaluate for filter removal.  Essential hypertension -Given possible GI bleed, losartan HCTZ currently held -Continue metoprolol  Dementia -Appears stable continue Namenda and Aricept  Acute metabolic encephalopathy -Secondary to the above, unknown baseline -UA showed few bacteria, 21-50 WBC, moderate leukocytes -COVID negative -Lung exam unremarkable -Currently afebrile with no leukocytosis  Hypokalemia -Resolved with replacement, continue to monitor BMP  Hypomagnesemia -With replacement, continue to monitor  DVT Prophylaxis left SCD  Code Status: DNR  Family Communication: None at bedside.  Daughter via phone  Disposition Plan: Admitted.  Continue to monitor hemoglobin and bowel movements. Suspect discharge to home with [facility] when BM are no longer bloody.  Consultants Gastroenterology Interventional radiology  Procedures  Right lower extremity Doppler retrievable IVC filter via right IJ approach  Antibiotics   Anti-infectives (From admission, onward)   None      Subjective:   Iselis Mehalic seen and examined today.  Patient with dementia.  Has no complaints this morning.  Objective:   Vitals:   07/09/19 1434 07/09/19 1728 07/09/19 2153 07/10/19  0504  BP: 105/64 (!) 141/85 111/73 113/66   Pulse: 83 99 100 73  Resp: 16 18 16 16   Temp: 97.6 F (36.4 C) 97.9 F (36.6 C) 98.5 F (36.9 C) 98.6 F (37 C)  TempSrc: Oral Axillary Oral Oral  SpO2: 99% 99% 98% 99%  Weight:        Intake/Output Summary (Last 24 hours) at 07/10/2019 1051 Last data filed at 07/09/2019 2155 Gross per 24 hour  Intake 530 ml  Output -  Net 530 ml   Filed Weights   07/05/19 2230 07/06/19 0150  Weight: 74.8 kg 74.1 kg   Exam  General: Well developed, well nourished, NAD, elderly  HEENT: NCAT, mucous membranes moist.   Cardiovascular: S1 S2 auscultated, RRR  Respiratory: Clear to auscultation bilaterally  Abdomen: Soft, nontender, nondistended, + bowel sounds  Extremities: warm dry without cyanosis clubbing or edema  Neuro: AAOx1 (self only), nonfocal  Psych: pleasantly confused  Data Reviewed: I have personally reviewed following labs and imaging studies  CBC: Recent Labs  Lab 07/05/19 1139 07/06/19 1102 07/07/19 0251 07/08/19 0153 07/09/19 0402 07/09/19 1842 07/10/19 0330  WBC 6.1 4.6 5.0 4.9  --   --   --   HGB 12.9 11.1* 10.9* 9.7* 9.2* 9.7* 9.2*  HCT 41.4 34.6* 32.9* 30.6* 28.9* 31.2* 29.2*  MCV 98.3 95.3 93.5 96.2  --   --   --   PLT 212 166 159 175  --   --   --    Basic Metabolic Panel: Recent Labs  Lab 07/06/19 1102 07/07/19 0251 07/08/19 0153 07/09/19 0402 07/10/19 0330  NA 140 141 142 142 139  K 3.1* 3.2* 3.5 3.7 4.0  CL 104 107 109 110 110  CO2 27 25 24 23  21*  GLUCOSE 96 88 101* 93 93  BUN 12 12 17 19 19   CREATININE 1.31* 1.30* 1.26* 1.34* 1.22*  CALCIUM 9.5 9.2 8.9 8.4* 8.7*  MG  --  1.5* 2.1 1.8 1.8   GFR: Estimated Creatinine Clearance: 29.2 mL/min (A) (by C-G formula based on SCr of 1.22 mg/dL (H)). Liver Function Tests: Recent Labs  Lab 07/05/19 1139  AST 25  ALT 12  ALKPHOS 67  BILITOT 0.5  PROT 6.7  ALBUMIN 3.4*   No results for input(s): LIPASE, AMYLASE in the last 168 hours. No results for input(s): AMMONIA in the last 168  hours. Coagulation Profile: Recent Labs  Lab 07/05/19 1825  INR 1.1   Cardiac Enzymes: No results for input(s): CKTOTAL, CKMB, CKMBINDEX, TROPONINI in the last 168 hours. BNP (last 3 results) No results for input(s): PROBNP in the last 8760 hours. HbA1C: No results for input(s): HGBA1C in the last 72 hours. CBG: Recent Labs  Lab 07/09/19 1726  GLUCAP 99   Lipid Profile: No results for input(s): CHOL, HDL, LDLCALC, TRIG, CHOLHDL, LDLDIRECT in the last 72 hours. Thyroid Function Tests: No results for input(s): TSH, T4TOTAL, FREET4, T3FREE, THYROIDAB in the last 72 hours. Anemia Panel: No results for input(s): VITAMINB12, FOLATE, FERRITIN, TIBC, IRON, RETICCTPCT in the last 72 hours. Urine analysis:    Component Value Date/Time   COLORURINE YELLOW 07/07/2019 1033   APPEARANCEUR CLEAR 07/07/2019 1033   LABSPEC 1.020 07/07/2019 1033   PHURINE 5.0 07/07/2019 1033   GLUCOSEU NEGATIVE 07/07/2019 1033   HGBUR NEGATIVE 07/07/2019 1033   BILIRUBINUR NEGATIVE 07/07/2019 1033   BILIRUBINUR Negative 02/18/2019 [place] 07/07/2019 1033   PROTEINUR NEGATIVE 07/07/2019 1033   UROBILINOGEN 1.0 02/18/2019  1658   UROBILINOGEN 0.2 03/20/2014 0753   NITRITE NEGATIVE 07/07/2019 1033   LEUKOCYTESUR MODERATE (A) 07/07/2019 1033   Sepsis Labs: @LABRCNTIP (procalcitonin:4,lacticidven:4)  ) Recent Results (from the past 240 hour(s))  SARS CORONAVIRUS 2 (TAT 6-24 HRS) Nasopharyngeal Nasopharyngeal Swab     Status: None   Collection Time: 07/06/19 12:30 AM   Specimen: Nasopharyngeal Swab  Result Value Ref Range Status   SARS Coronavirus 2 NEGATIVE NEGATIVE Final    Comment: (NOTE) SARS-CoV-2 target nucleic acids are NOT DETECTED. The SARS-CoV-2 RNA is generally detectable in upper and lower respiratory specimens during the acute phase of infection. Negative results do not preclude SARS-CoV-2 infection, do not rule out co-infections with other pathogens, and should not be  used as the sole basis for treatment or other patient management decisions. Negative results must be combined with clinical observations, patient history, and epidemiological information. The expected result is Negative. Fact Sheet for Patients: SugarRoll.be Fact Sheet for Healthcare Providers: https://www.woods-mathews.com/ This test is not yet approved or cleared by the [place] FDA and  has been authorized for detection and/or diagnosis of SARS-CoV-2 by FDA under an Emergency Use Authorization (EUA). This EUA will remain  in effect (meaning this test can be used) for the duration of the COVID-19 declaration under Section 56 4(b)(1) of the Act, 21 U.S.C. section 360bbb-3(b)(1), unless the authorization is terminated or revoked sooner. Performed at [facility], [place] [address] [place], [place] [address]       Radiology Studies: No results found.   Scheduled Meds: . donepezil  10 mg Oral QHS  . hydrocortisone  25 mg Rectal BID  . memantine  10 mg Oral BID  . metoprolol tartrate  25 mg Oral BID  . pantoprazole  40 mg Oral Daily  . potassium chloride  40 mEq Oral Daily   Continuous Infusions:    LOS: 4 days   Time Spent in minutes   30 minutes  Ryna Beckstrom D.O. on 07/10/2019 at 10:51 AM  Between 7am to 7pm - Please see pager noted on [email or web address]  After 7pm go to [email or web address]  And look for the night coverage person covering for me after hours  [facility] Office  [phone]

## 2019-07-10 NOTE — Progress Notes (Addendum)
HISTORY OF PRESENT ILLNESS:  Wendy Krueger is a 86 y.o. female who we were reconsulted on this morning regarding blood with bowel movement.  Mild drift in hemoglobin from 9.7-9.2.  Plans for discharge halted.  No particular complaints  REVIEW OF SYSTEMS:  All relevant non-GI ROS negative   Past Medical History:  Diagnosis Date  . Asymptomatic gallstones   . BRBPR (bright red blood per rectum)   . Cataract 04/13/2018   bilateral eyes  . Cholelithiasis   . Colitis - presumed infectious origin    one ER visit   . Dementia ([place])   . Duodenitis   . Fall   . Gastritis   . GI bleed   . Hiatal hernia   . Hyperlipidemia   . Hypertension   . Osteoarthritis   . Osteoporosis   . Stroke ([place])   . Syncope     Past Surgical History:  Procedure Laterality Date  . ABDOMINAL HYSTERECTOMY     BSO- fibroids  . ABI's     normal  . dexa  1/05   osteoporosis  . ESOPHAGOGASTRODUODENOSCOPY N/A 03/21/2014   Procedure: ESOPHAGOGASTRODUODENOSCOPY (EGD);  Surgeon: Milus Banister, MD;  Location: [place];  Service: Endoscopy;  Laterality: N/A;  . IR IVC FILTER PLMT / S&I /IMG GUID/MOD SED  07/06/2019  . left foot brace    . WRIST FRACTURE SURGERY  2/01   R arm  . WRIST FRACTURE SURGERY Left 02/12/2018    Social History Wendy Krueger  reports that she has never smoked. She has never used smokeless tobacco. She reports that she does not drink alcohol or use drugs.  family history includes Breast cancer in her daughter; Heart attack in her father and mother; Hypertension in her father; Throat cancer in her brother.  Allergies  Allergen Reactions  . Alendronate Sodium Other (See Comments)    Leg pain   . Amlodipine Besylate Hives  . Calcitonin ([place]) Other (See Comments)     headache/ head pressure  . Plavix [Clopidogrel Bisulfate]     drowsy    . Simvastatin Other (See Comments)    Leg pain        PHYSICAL EXAMINATION: Not reexamined  ASSESSMENT:  1.  Intermittent  rectal bleeding.  Possible etiologies include previous identified hemorrhoids for which she is being treated, stercoral ulcer of the rectum (large stool burden in the rectum on CT imaging) or cancer.  Poor candidate for colonoscopy as discussed on previous occasions.   PLAN:  1.  Agree with additional observation overnight.  Monitor stools and blood counts 2.  Continue twice daily medicated suppositories 3.  If the patient exhibits ongoing bleeding with uncertain origin, then it would be reasonable to proceed to limited flexible sigmoidoscopy.  We will reassess her clinical status tomorrow.  I discussed this with the patient's daughter who was in the room  Discussed with Dr. Ree Kida. Thank you  [name]., M.D. [facility] Division of Gastroenterology

## 2019-07-11 LAB — HEMOGLOBIN AND HEMATOCRIT, BLOOD
HCT: 29.2 % — ABNORMAL LOW (ref 36.0–46.0)
Hemoglobin: 9.4 g/dL — ABNORMAL LOW (ref 12.0–15.0)

## 2019-07-11 NOTE — Progress Notes (Addendum)
     Progress Note    ASSESSMENT AND PLAN:   86 yo female with dementia, HTN, hx of CVA, CKD.   Rectal bleeding. Very large external hemorrhoids on exam, suspect she has internal ones as well. Bleeding has improved today. Hgb stable at 9.4.  -Plan was for flexible sigmoidoscopy tomorrow but bleeding has improved. Given advanced age / medical problems patient is at increased risk for procedures so prefer to treat for presumed hemorrhoidal bleeding.  -Stable for discharge from GI standpoint. Discussed plan with daughter.  --Anusol cream intrarectally and perianally BID x 7 day more days.  --Having hemorrhoidal discomfort at time so recommended Recticare as needed.  --Patient seems to either have diarrhea or constipation, both can aggravate hemorrhoids. Trial of daily Metamucil to regulate bowels.  --If continues to bleed at home then daughter will call office for appointment to see me.      SUBJECTIVE    No specific complaints   OBJECTIVE:     Vital signs in last 24 hours: Temp:  [97.7 F (36.5 C)-98.6 F (37 C)] 98.6 F (37 C) (01/10 0550) Pulse Rate:  [66-102] 102 (01/10 0550) Resp:  [18] 18 (01/10 0550) BP: (120-132)/(75-94) 120/75 (01/10 0550) SpO2:  [99 %-100 %] 99 % (01/10 0550) Last BM Date: 07/10/19 General:   Alert, NAD Heart:  Regular rate and rhythm Pulm: Normal respiratory effort   Abdomen:  Soft, nondistended, nontender.  Normal bowel sounds. Rectal : very large external hemorrhoids / ? Protruding internal hemorrhoids          Psych:  Pleasant, cooperative.  Normal mood and affect.   Intake/Output from previous day: No intake/output data recorded. Intake/Output this shift: No intake/output data recorded.  Lab Results: Recent Labs    07/09/19 1842 07/10/19 0330 07/11/19 0204  HGB 9.7* 9.2* 9.4*  HCT 31.2* 29.2* 29.2*   BMET Recent Labs    07/09/19 0402 07/10/19 0330  NA 142 139  K 3.7 4.0  CL 110 110  CO2 23 21*  GLUCOSE 93 93  BUN 19  19  CREATININE 1.34* 1.22*  CALCIUM 8.4* 8.7*    Principal Problem:   [place] Active Problems:   Essential hypertension   Dementia ([facility])   BRBPR (bright red blood per rectum)   Acute encephalopathy   GI bleed   Abnormal CT of the abdomen   External hemorrhoids     LOS: 5 days   Tye Savoy ,NP 07/11/2019, 12:47 PM  GI ATTENDING  Interval history data reviewed.  Patient seen and examined.  Agree with comprehensive interval progress note as outlined above.  Large hemorrhoids documented.  Daughter given instructions regarding her chronic constipation and bleeding hemorrhoids.  No further significant bleeding.  Will sign off.  [name]., M.D. [facility] Division of Gastroenterology

## 2019-07-11 NOTE — Progress Notes (Signed)
PROGRESS NOTE    Wendy Krueger  [id number] DOB: Jun 28, 1930 DOA: 07/05/2019 PCP: Abner Greenspan, MD   Brief Narrative:  HPI on 07/05/2019 by Dr. Jennette Kettle Wendy Krueger is a 86 y.o. female with medical history significant of dementia, prior GI bleed, HTN. Not on anticoagulants. Patient has been having intermittent diarrhea for the past several weeks.  Intermittent c/o abd pain with this.  Family tried giving her peptobismol for a couple of days, last on Sat. Today patient with BRBPR, not clear if from hemorrhoids or not.  Patient with pain and AMS with this.  Pt brought in to ED.  Interim history Admitted with GI bleed.  FOBT was positive.  Hemoglobin currently stable.  Currently on Protonix drip.  Gastroenterology consulted and appreciated and recommended conservative management with anusol suppositories, would not offer colonoscopy at this time.  Assessment & Plan   GI bleed -FOBT was positive, patient was noted to have bright red blood per rectum, however per H&P, patient was noted to have black stool and she has recently been on Pepto-Bismol. -?  Question hemorrhoidal bleeding vs diverticular bleed (patient with diverticulosis without diverticulitis on imaging) -CT abdomen pelvis showed no definite acute abnormality.  Questionable filling defect in the right superficial femoral vein extending into the right common femoral vein, lower extremity Doppler recommended.  2.6 cm density in the colon at the level of hepatic flexure could represent stool, mass not excluded.  Large amount of stool in the rectum.  Severe sigmoid diverticulosis. -Was on protonix drip and transitioned to oral -Gastroenterology consulted and appreciated, recommended continuing conservative management.  No colonoscopy at this time.  Continue Anusol suppositories.  -Patient had a vasovagal episode after a bloody bowel movement on 07/09/19.  -hemoglobin 9.4- appears to be stable over the past few days.  Hemoglobin was 12.9 on admission. -Dicussed with Dr. Henrene Pastor, GI, continue supportive management. Would not scope her at this time. Continue BID dosing of anusol suppositories. May consider limited flexible sigmoidoscopy  -Continue to monitor [place]   Right lower extremity DVT -Noted on CT scan as above -Lower extremity Doppler: Acute DVT involving the right common femoral vein, SF junction, right proximal vein, right proximal profunda vein, right popliteal vein, right posterior tibial veins, right peroneal vein. -Given possible GI bleed, IR consulted for IVC filter -S/p basement of a retrievable IVC filter via right IJ approach. IR recommended no submersion for 7 days. Okay for anticoagulation in 3 to 6 months and can be referred to [place] [facility] to reevaluate for filter removal.  Essential hypertension -Given possible GI bleed, losartan HCTZ currently held -Continue metoprolol  Dementia -Appears stable continue Namenda and Aricept  Acute metabolic encephalopathy -Secondary to the above- patient with dementia -UA showed few bacteria, 21-50 WBC, moderate leukocytes -COVID negative -Lung exam unremarkable -Currently afebrile with no leukocytosis  Hypokalemia -Resolved with replacement, continue to monitor BMP  Hypomagnesemia -With replacement, continue to monitor  DVT Prophylaxis left SCD  Code Status: DNR  Family Communication: None at bedside.    Disposition Plan: Admitted.  Continue to monitor hemoglobin and bowel movements. Suspect discharge to home with [facility] when BM are no longer bloody.  Consultants Gastroenterology Interventional radiology  Procedures  Right lower extremity Doppler retrievable IVC filter via right IJ approach  Antibiotics   Anti-infectives (From admission, onward)   None      Subjective:   Wendy Krueger seen and examined today.  Patient with dementia.  Has no complaints this morning.  Objective:   Vitals:   07/10/19 0504 07/10/19 1523 07/10/19  2050 07/11/19 0550  BP: 113/66 124/83 (!) 132/94 120/75  Pulse: 73 66 96 (!) 102  Resp: 16   18  Temp: 98.6 F (37 C) 97.7 F (36.5 C) 97.9 F (36.6 C) 98.6 F (37 C)  TempSrc: Oral Oral Oral Oral  SpO2: 99% 99% 100% 99%  Weight:       No intake or output data in the 24 hours ending 07/11/19 1159 Filed Weights   07/05/19 2230 07/06/19 0150  Weight: 74.8 kg 74.1 kg   Exam  General: Well developed, well nourished, NAD, elderly  HEENT: NCAT, mucous membranes moist.   Cardiovascular: S1 S2 auscultated, RRR  Respiratory: Clear to auscultation bilaterally  Abdomen: Soft, nontender, nondistended, + bowel sounds  Extremities: warm dry without cyanosis clubbing or edema  Neuro: AAOx1 (self only), nonfocal  Psych: pleasantly confused  Data Reviewed: I have personally reviewed following labs and imaging studies  CBC: Recent Labs  Lab 07/05/19 1139 07/06/19 1102 07/07/19 0251 07/08/19 0153 07/09/19 0402 07/09/19 1842 07/10/19 0330 07/11/19 0204  WBC 6.1 4.6 5.0 4.9  --   --   --   --   HGB 12.9 11.1* 10.9* 9.7* 9.2* 9.7* 9.2* 9.4*  HCT 41.4 34.6* 32.9* 30.6* 28.9* 31.2* 29.2* 29.2*  MCV 98.3 95.3 93.5 96.2  --   --   --   --   PLT 212 166 159 175  --   --   --   --    Basic Metabolic Panel: Recent Labs  Lab 07/06/19 1102 07/07/19 0251 07/08/19 0153 07/09/19 0402 07/10/19 0330  NA 140 141 142 142 139  K 3.1* 3.2* 3.5 3.7 4.0  CL 104 107 109 110 110  CO2 27 25 24 23  21*  GLUCOSE 96 88 101* 93 93  BUN 12 12 17 19 19   CREATININE 1.31* 1.30* 1.26* 1.34* 1.22*  CALCIUM 9.5 9.2 8.9 8.4* 8.7*  MG  --  1.5* 2.1 1.8 1.8   GFR: Estimated Creatinine Clearance: 29.2 mL/min (A) (by C-G formula based on SCr of 1.22 mg/dL (H)). Liver Function Tests: Recent Labs  Lab 07/05/19 1139  AST 25  ALT 12  ALKPHOS 67  BILITOT 0.5  PROT 6.7  ALBUMIN 3.4*   No results for input(s): LIPASE, AMYLASE in the last 168 hours. No results for input(s): AMMONIA in the last 168  hours. Coagulation Profile: Recent Labs  Lab 07/05/19 1825  INR 1.1   Cardiac Enzymes: No results for input(s): CKTOTAL, CKMB, CKMBINDEX, TROPONINI in the last 168 hours. BNP (last 3 results) No results for input(s): PROBNP in the last 8760 hours. HbA1C: No results for input(s): HGBA1C in the last 72 hours. CBG: Recent Labs  Lab 07/09/19 1726  GLUCAP 99   Lipid Profile: No results for input(s): CHOL, HDL, LDLCALC, TRIG, CHOLHDL, LDLDIRECT in the last 72 hours. Thyroid Function Tests: No results for input(s): TSH, T4TOTAL, FREET4, T3FREE, THYROIDAB in the last 72 hours. Anemia Panel: No results for input(s): VITAMINB12, FOLATE, FERRITIN, TIBC, IRON, RETICCTPCT in the last 72 hours. Urine analysis:    Component Value Date/Time   COLORURINE YELLOW 07/07/2019 1033   APPEARANCEUR CLEAR 07/07/2019 1033   LABSPEC 1.020 07/07/2019 1033   PHURINE 5.0 07/07/2019 [place] 07/07/2019 1033   HGBUR NEGATIVE 07/07/2019 1033   BILIRUBINUR NEGATIVE 07/07/2019 1033   BILIRUBINUR Negative 02/18/2019 [place] 07/07/2019 1033   PROTEINUR NEGATIVE  07/07/2019 1033   UROBILINOGEN 1.0 02/18/2019 1658   UROBILINOGEN 0.2 03/20/2014 0753   NITRITE NEGATIVE 07/07/2019 1033   LEUKOCYTESUR MODERATE (A) 07/07/2019 1033   Sepsis Labs: @LABRCNTIP (procalcitonin:4,lacticidven:4)  ) Recent Results (from the past 240 hour(s))  SARS CORONAVIRUS 2 (TAT 6-24 HRS) Nasopharyngeal Nasopharyngeal Swab     Status: None   Collection Time: 07/06/19 12:30 AM   Specimen: Nasopharyngeal Swab  Result Value Ref Range Status   SARS Coronavirus 2 NEGATIVE NEGATIVE Final    Comment: (NOTE) SARS-CoV-2 target nucleic acids are NOT DETECTED. The SARS-CoV-2 RNA is generally detectable in upper and lower respiratory specimens during the acute phase of infection. Negative results do not preclude SARS-CoV-2 infection, do not rule out co-infections with other pathogens, and should not be  used as the sole basis for treatment or other patient management decisions. Negative results must be combined with clinical observations, patient history, and epidemiological information. The expected result is Negative. Fact Sheet for Patients: SugarRoll.be Fact Sheet for Healthcare Providers: https://www.woods-mathews.com/ This test is not yet approved or cleared by the [place] FDA and  has been authorized for detection and/or diagnosis of SARS-CoV-2 by FDA under an Emergency Use Authorization (EUA). This EUA will remain  in effect (meaning this test can be used) for the duration of the COVID-19 declaration under Section 56 4(b)(1) of the Act, 21 U.S.C. section 360bbb-3(b)(1), unless the authorization is terminated or revoked sooner. Performed at [facility], [place] [address], [place], [place] [address]       Radiology Studies: No results found.   Scheduled Meds: . donepezil  10 mg Oral QHS  . hydrocortisone  25 mg Rectal BID  . memantine  10 mg Oral BID  . metoprolol tartrate  25 mg Oral BID  . pantoprazole  40 mg Oral Daily  . potassium chloride  40 mEq Oral Daily   Continuous Infusions:    LOS: 5 days   Time Spent in minutes   30 minutes  Lundynn Cohoon D.O. on 07/11/2019 at 11:59 AM  Between 7am to 7pm - Please see pager noted on [email or web address]  After 7pm go to [email or web address]  And look for the night coverage person covering for me after hours  [facility] Office  [phone]

## 2019-07-12 ENCOUNTER — Other Ambulatory Visit: Payer: Self-pay | Admitting: *Deleted

## 2019-07-12 LAB — HEMOGLOBIN AND HEMATOCRIT, BLOOD
HCT: 28.4 % — ABNORMAL LOW (ref 36.0–46.0)
Hemoglobin: 9 g/dL — ABNORMAL LOW (ref 12.0–15.0)

## 2019-07-12 MED ORDER — HYDROCHLOROTHIAZIDE 25 MG PO TABS: 12.5000 mg | ORAL_TABLET | Freq: Every day | ORAL | Status: DC

## 2019-07-12 MED ORDER — LOSARTAN POTASSIUM 100 MG PO TABS: 50.0000 mg | ORAL_TABLET | Freq: Every day | ORAL | Status: DC

## 2019-07-12 MED ORDER — PANTOPRAZOLE SODIUM 40 MG PO TBEC: 40.0000 mg | DELAYED_RELEASE_TABLET | Freq: Every day | ORAL | 0 refills | Status: DC

## 2019-07-12 MED ORDER — HYDROCORTISONE ACETATE 25 MG RE SUPP: 25.0000 mg | Freq: Two times a day (BID) | RECTAL | 0 refills | Status: DC

## 2019-07-12 MED ORDER — HYDROCORTISONE (PERIANAL) 2.5 % EX CREA: 1.0000 "application " | TOPICAL_CREAM | Freq: Two times a day (BID) | CUTANEOUS | 1 refills | Status: DC | PRN

## 2019-07-12 NOTE — Progress Notes (Signed)
Physical Therapy Treatment Patient Details Name: SASHEEN LAGRASSA MRN: [id number] DOB: 20-Nov-1929 Today's Date: 07/12/2019    History of Present Illness 86 y/o admitted with GI bleed after being admitted with diarrhea.  She was found to have R LE DVT and s/p IVC filter placement (07/06/19).  PMH: dementia, CVA, HTN, asymptomatic gallstones    PT Comments    Pt pleasantly confused and able to progress gait and perform HEP. Pt benefits from reassurance and encouragement with guarding throughout for safety and balance. Pt with assist for pericare in standing limited by sacrum pain due to hemorrhoids. Will continue to follow.    Follow Up Recommendations  Home health PT;Supervision/Assistance - 24 hour     Equipment Recommendations  None recommended by PT    Recommendations for Other Services       Precautions / Restrictions Precautions Precautions: Fall    Mobility  Bed Mobility Overal bed mobility: Needs Assistance Bed Mobility: Supine to Sit     Supine to sit: Min guard     General bed mobility comments: guarding with increased time to achieve EOB, HOB 30 degrees  Transfers       Sit to Stand: Min guard         General transfer comment: cues for hand placement and safety to rise from bed, to and from toilet with rail  Ambulation/Gait Ambulation/Gait assistance: Min guard Gait Distance (Feet): 70 Feet Assistive device: Rolling walker (2 wheeled) Gait Pattern/deviations: Step-through pattern;Decreased step length - right;Decreased step length - left;Trunk flexed   Gait velocity interpretation: 1.31 - 2.62 ft/sec, indicative of limited community ambulator General Gait Details: pt able to walk 20' with RW to bathroom then 98' in hall with RW. cues for direction and safety, pt limited by fatigue   Stairs             Wheelchair Mobility    Modified Rankin (Stroke Patients Only)       Balance Overall balance assessment: Needs  assistance Sitting-balance support: No upper extremity supported Sitting balance-Leahy Scale: Good Sitting balance - Comments: pt able to sit on toilet without assist   Standing balance support: Bilateral upper extremity supported Standing balance-Leahy Scale: Poor Standing balance comment: use of RW for gait                            Cognition Arousal/Alertness: Awake/alert Behavior During Therapy: WFL for tasks assessed/performed Overall Cognitive Status: No family/caregiver present to determine baseline cognitive functioning                                 General Comments: baseline dementia, no family present, slow processing, decreased memory and safety      Exercises General Exercises - Lower Extremity Long Arc Quad: AAROM;Both;Seated;15 reps Hip Flexion/Marching: AAROM;Both;Seated;15 reps    General Comments        Pertinent Vitals/Pain Pain Assessment: No/denies pain    Home Living                      Prior Function            PT Goals (current goals can now be found in the care plan section) Progress towards PT goals: Progressing toward goals    Frequency    Min 3X/week      PT Plan Current plan remains appropriate    Co-evaluation  AM-PAC PT "6 Clicks" Mobility   Outcome Measure  Help needed turning from your back to your side while in a flat bed without using bedrails?: A Little Help needed moving from lying on your back to sitting on the side of a flat bed without using bedrails?: A Little Help needed moving to and from a bed to a chair (including a wheelchair)?: A Little Help needed standing up from a chair using your arms (e.g., wheelchair or bedside chair)?: A Little Help needed to walk in hospital room?: A Little Help needed climbing 3-5 steps with a railing? : A Little 6 Click Score: 18    End of Session Equipment Utilized During Treatment: Gait belt Activity Tolerance: Patient  tolerated treatment well Patient left: in chair;with call bell/phone within reach;with chair alarm set Nurse Communication: Mobility status PT Visit Diagnosis: Muscle weakness (generalized) (M62.81);Difficulty in walking, not elsewhere classified (R26.2)     Time: [id number] PT Time Calculation (min) (ACUTE ONLY): 19 min  Charges:  $Gait Training: 8-22 mins                     Bayard Males, PT Acute Rehabilitation Services Pager: [phone] Office: [place] 07/12/2019, 10:14 AM

## 2019-07-12 NOTE — Progress Notes (Signed)
Patient discharged to home. AVS reviewed with daughter at bedside, she verbalizes understanding of all discharge instructions including discharge medications and follow up MD visits. Patient awaiting ride arrival. Will be transported via wheelchair.

## 2019-07-12 NOTE — Discharge Summary (Signed)
Physician Discharge Summary  Wendy Krueger [id number] DOB: 08-12-1929 DOA: 07/05/2019  PCP: Abner Greenspan, MD  Admit date: 07/05/2019 Discharge date: 07/12/2019  Time spent: 45 minutes  Recommendations for Outpatient Follow-up:  Patient will be discharged to home with home health services, physical and occupational therapy, nursing, aide.  Patient will need to follow up with primary care provider within one week of discharge, repeat CBC. Follow up with gastroenterology.   Patient should continue medications as prescribed.  Patient should follow a soft diet.   Discharge Diagnoses:  GI bleed Right lower extremity DVT Essential hypertension Dementia Acute metabolic encephalopathy Hypokalemia Hypomagnesemia  Discharge Condition: stable  Diet recommendation: soft  Filed Weights   07/05/19 2230 07/06/19 0150  Weight: 74.8 kg 74.1 kg    History of present illness:  on 07/05/2019 by Dr. Regis Bill [name] a 86 y.o.femalewith medical history significant ofdementia, prior GI bleed, HTN. Not on anticoagulants. Patient has been having intermittent diarrhea for the past several weeks. Intermittent c/o abd pain with this. Family tried giving her peptobismol for a couple of days, last on Sat. Today patient with BRBPR, not clear if from hemorrhoids or not. Patient with pain and AMS with this. Pt brought in to ED.  Hospital Course:  GI bleed -FOBT was positive, patient was noted to have bright red blood per rectum, however per H&P, patient was noted to have black stool and she has recently been on Pepto-Bismol. -?  Question hemorrhoidal bleeding vs diverticular bleed (patient with diverticulosis without diverticulitis on imaging) -CT abdomen pelvis showed no definite acute abnormality.  Questionable filling defect in the right superficial femoral vein extending into the right common femoral vein, lower extremity Doppler recommended.  2.6 cm density in the colon at  the level of hepatic flexure could represent stool, mass not excluded.  Large amount of stool in the rectum.  Severe sigmoid diverticulosis. -Was on protonix drip and transitioned to oral -Gastroenterology consulted and appreciated, recommended continuing conservative management.  No colonoscopy at this time.  Continue Anusol suppositories.  -Patient had a vasovagal episode after a bloody bowel movement on 07/09/19.  -hemoglobin 9.0- appears to be stable over the past few days. Hemoglobin was 12.9 on admission. -Dicussed with Dr. Henrene Pastor, GI, continue supportive management. Would not scope her at this time. Continue BID dosing of anusol suppositories. May consider limited flexible sigmoidoscopy  -repeat labs in one week  Right lower extremity DVT -Noted on CT scan as above -Lower extremity Doppler: Acute DVT involving the right common femoral vein, SF junction, right proximal vein, right proximal profunda vein, right popliteal vein, right posterior tibial veins, right peroneal vein. -Given possible GI bleed, IR consulted for IVC filter -S/p basement of a retrievable IVC filter via right IJ approach. IR recommended no submersion for 7 days. Okay for anticoagulation in 3 to 6 months and can be referred to [facility] to reevaluate for filter removal.  Essential hypertension -Given possible GI bleed, losartan HCTZ currently held- resume upon discharge at half doses -Continue metoprolol -BP has been stable  Dementia -Appears stable continue Namenda and Aricept  Acute metabolic encephalopathy -Secondary to the above- patient with dementia -UA showed few bacteria, 21-50 WBC, moderate leukocytes -COVID negative -Lung exam unremarkable -Currently afebrile with no leukocytosis  Hypokalemia -Resolved with replacement  Hypomagnesemia -With replacement  Consultants Gastroenterology Interventional radiology  Procedures  Right lower extremity Doppler retrievable IVC filter via right IJ  approach  Discharge Exam: Vitals:   07/12/19 0527  07/12/19 1259  BP: 135/80 132/64  Pulse: 73 60  Resp: 15 19  Temp: 98.3 F (36.8 C) 97.9 F (36.6 C)  SpO2: 100% 93%     General: Well developed, well nourished, NAD, elderly   HEENT: NCAT,mucous membranes moist.  Cardiovascular: S1 S2 auscultated, RRR  Respiratory: Clear to auscultation bilaterally  Abdomen: Soft, nontender, nondistended, + bowel sounds  Extremities: warm dry without cyanosis clubbing or edema  Neuro: AAOx1 (self only), nonfocal  Psych: Pleasantly confused   Discharge Instructions Discharge Instructions    AMB Referral to [facility]   Complete by: As directed    Pt is previously active with [facility] for hypertension management.  Current plan is for patient to discharge home with home health PT to improve functional strength.  Pt's daughter [name]) reports having a home aide from [facility] coming 6 times a week, 5 hours/day to assist with patient's care. Patient's daughter [name]) who lives with her, manages medications and uses [facility] [employer]. Daughter [name]) provides transportation. According to daughter, patient's family take turns in assisting patient and providing 24/7 care.  Daughter expressed concern about patient's bleeding (GI) and blood clot to right lower extremity as patient continues to complain of pain to right leg. She also wants to continue to keep track in managing patient's HTN. Daughter agrees to continue to receive follow-up calls from [facility] care management coordinator after patient's discharge.    PCP is Dr. Loura Pardon with [facility], listed as providing transitionof care follow-up.   Reason for consult: Referral to [facility] coordinator for complex disease management for BP control (HTN) with changes in medication this admission and to assessforfurther needs post hospital discharge.   Diagnoses of: Other   Other Diagnosis: HTN,  GI bleed, RLE DVT s/p IVC filter placement   Expected date of contact: 1-3 days (reserved for hospital discharges)   Discharge instructions   Complete by: As directed    Patient will be discharged to home with home health services, physical and occupational therapy, nursing, aide.  Patient will need to follow up with primary care provider within one week of discharge, repeat CBC. Follow up with gastroenterology.   Patient should continue medications as prescribed.  Patient should follow a soft diet.     Allergies as of 07/12/2019      Reactions   Alendronate Sodium Other (See Comments)   Leg pain    Amlodipine Besylate Hives   Calcitonin (salmon) Other (See Comments)    headache/ head pressure   Plavix [clopidogrel Bisulfate]    drowsy     Simvastatin Other (See Comments)   Leg pain       Medication List    TAKE these medications   ALLERGY 24-HR PO Take 1 capsule by mouth daily.   [facility] SPECTRAVITE PO Take 1 capsule by mouth daily.   donepezil 10 MG tablet Commonly known as: ARICEPT TAKE 1 TABLET (10 MG TOTAL) BY MOUTH AT BEDTIME.   hydrochlorothiazide 25 MG tablet Commonly known as: HYDRODIURIL Take 0.5 tablets (12.5 mg total) by mouth daily. What changed: how much to take   hydrocortisone 2.5 % rectal cream Commonly known as: Anusol-HC Place 1 application rectally 2 (two) times daily as needed for hemorrhoids or anal itching.   hydrocortisone 25 MG suppository Commonly known as: ANUSOL-HC Place 1 suppository (25 mg total) rectally 2 (two) times daily. IF affordable.   losartan 100 MG tablet Commonly known as: COZAAR Take 0.5  tablets (50 mg total) by mouth daily. What changed: how much to take   Melatonin 10 MG Tabs Take 10 mg by mouth at bedtime.   memantine 10 MG tablet Commonly known as: NAMENDA TAKE 1 TABLET (10 MG TOTAL) BY MOUTH 2 (TWO) TIMES DAILY.   metoprolol tartrate 25 MG tablet Commonly known as: LOPRESSOR Take 1 tablet (25 mg total) by mouth 2  (two) times daily.   pantoprazole 40 MG tablet Commonly known as: PROTONIX Take 1 tablet (40 mg total) by mouth daily. Start taking on: July 13, 2019   potassium chloride 10 MEQ tablet Commonly known as: Klor-Con M10 Take 2 tablets (20 mEq total) by mouth daily. What changed: when to take this   PROBIOTIC PO Take 1 tablet by mouth daily.   vitamin B-12 1000 MCG tablet Commonly known as: CYANOCOBALAMIN Take 1,000 mcg by mouth daily.   Vitamin D-3 25 MCG (1000 UT) Caps Take 1,000-2,000 Units by mouth See admin instructions. Take 1000 units in the morning and 2000 in the evening      Allergies  Allergen Reactions   Alendronate Sodium Other (See Comments)    Leg pain    Amlodipine Besylate Hives   Calcitonin (Salmon) Other (See Comments)     headache/ head pressure   Plavix [Clopidogrel Bisulfate]     drowsy     Simvastatin Other (See Comments)    Leg pain    Follow-up Information    Tower, Wynelle Fanny, MD. Schedule an appointment as soon as possible for a visit in 1 week(s).   Specialties: Family Medicine, Radiology Why: Hospital follow up Contact information: [place] [address] [phone]        Willia Craze, NP. Schedule an appointment as soon as possible for a visit in 1 week(s).   Specialty: Gastroenterology Why: Hospital follow up Contact information: [place] [address] [phone]            The results of significant diagnostics from this hospitalization (including imaging, microbiology, ancillary and laboratory) are listed below for reference.    Significant Diagnostic Studies: CT ABDOMEN PELVIS W CONTRAST  Result Date: 07/05/2019 CLINICAL DATA:  Diverticulitis. EXAM: CT ABDOMEN AND PELVIS WITH CONTRAST TECHNIQUE: Multidetector CT imaging of the abdomen and pelvis was performed using the standard protocol following bolus administration of intravenous contrast. CONTRAST:  73mL OMNIPAQUE  IOHEXOL 300 MG/ML  SOLN COMPARISON:  Nov 07, 2011 FINDINGS: Lower chest: There is presumed atelectasis at the left lung base.The heart size is borderline enlarged. Hepatobiliary: Again noted is a hemangioma in the left hepatic lobe. Cholelithiasis without acute inflammation.There is no biliary ductal dilation. Pancreas: Normal contours without ductal dilatation. No peripancreatic fluid collection. Spleen: No splenic laceration or hematoma. Adrenals/Urinary Tract: --Adrenal glands: No adrenal hemorrhage. --Right kidney/ureter: There is an exophytic cyst arising from the right kidney measuring approximately 4.8 cm. There is no right-sided hydronephrosis or radiopaque obstructing kidney stone --Left kidney/ureter: . --Urinary bladder: Unremarkable. Stomach/Bowel: --Stomach/Duodenum: There is a large hiatal hernia. --Small bowel: No dilatation or inflammation. --Colon: There is a large amount of stool in the rectum. There is severe sigmoid diverticulosis without CT evidence for diverticulitis. There is a rounded approximately 2.6 cm density in the colon at the level of the a hepatic flexure (axial series 2, image 35). --Appendix: Not visualized. No right lower quadrant inflammation or free fluid. Vascular/Lymphatic: Atherosclerotic calcification is present within the non-aneurysmal abdominal aorta, without hemodynamically significant stenosis. There  is a questionable filling defect within the right superficial femoral vein extending into the right common femoral vein. --No retroperitoneal lymphadenopathy. --No mesenteric lymphadenopathy. --No pelvic or inguinal lymphadenopathy. Reproductive: Status post hysterectomy. No adnexal mass. Other: No ascites or free air. The abdominal wall is normal. Musculoskeletal. There are degenerative changes throughout the thoracolumbar spine without evidence for an acute displaced fracture. IMPRESSION: 1. No definite acute abnormality detected. 2. There is a questionable filling defect  within the right superficial femoral vein extending into the right common femoral vein. Recommend correlation with lower extremity venous Doppler. 3. There is a rounded approximately 2.6 cm density in the colon at the level of the a hepatic flexure. While this could represent stool, a mass is not excluded. Recommend correlation with colonoscopy. 4. There is a large amount of stool in the rectum. Severe sigmoid diverticulosis without CT evidence for diverticulitis. 5. Cholelithiasis without acute inflammation. 6. Large hiatal hernia. Aortic Atherosclerosis (ICD10-I70.0). Electronically Signed   By: Constance Holster M.D.   On: 07/05/2019 20:32   IR IVC FILTER PLMT / S&I Burke Keels GUID/MOD SED  Result Date: 07/06/2019 INDICATION: 86 year old female with a history of right lower extremity DVT, gastrointestinal hemorrhage, referred for IVC filter placement EXAM: ULTRASOUND-GUIDED ACCESS RIGHT INTERNAL JUGULAR VEIN CAVAGRAM PLACEMENT OF RETRIEVABLE IVC FILTER MEDICATIONS: None. ANESTHESIA/SEDATION: None FLUOROSCOPY TIME:  Fluoroscopy Time: 2 minutes 6 seconds (55 mGy). COMPLICATIONS: None PROCEDURE: The procedure, risks, benefits, and alternatives were explained to the patient and the patient's family. Specific risks discussed include bleeding, infection, contrast reaction, renal failure, IVC filter fracture, migration, iliocaval thrombus (3% incidence), need for further procedure, need for further surgery, pulmonary embolism, cardiopulmonary collapse, death. Questions regarding the procedure were encouraged and answered. The patient understands and consents to the procedure. Ultrasound survey was performed with images stored and sent to PACs. The right neck was prepped with chlorhexidine in a sterile fashion, and a sterile drape was applied covering the operative field. A sterile gown and sterile gloves were used for the procedure. Local anesthesia was provided with 1% Lidocaine. A micropuncture needle was used access  the right internal jugular vein under ultrasound. With excellent venous blood flow returned, and an .018 micro wire was passed through the needle. Small incision was made with an 11 blade scalpel. The needle was removed, and a micropuncture sheath was placed over the wire. The inner dilator and wire were removed, and an 035 Bentson wire was advanced under fluoroscopy into the IVC. Serial dilation of the soft tissue tract was performed with an 8 [place] dilator and subsequently a 10 [place] dilator. The delivery sheath for a retrievable Bard Denali filter was passed over the Bentson wire into the IVC. The wire was removed and small contrast was used to confirm IVC location. IVC cavagram performed with CO2. Dilator was removed, and the IVC filter was then delivered, positioned below the lowest renal vein. Repeat cavagram performed, and the catheter was removed. Manual pressure was used for hemostasis. Patient tolerated the procedure well and remained hemodynamically stable throughout. No complications were encountered and no significant blood loss was encounter. IMPRESSION: Status post ultrasound-guided right internal jugular vein access for retrievable IVC filter placement. Signed, Dulcy Fanny. Dellia Nims, RPVI [facility] PLAN: This IVC filter is potentially retrievable. The patient can be assessed for filter retrieval by Interventional Radiology in approximately 8-12 weeks. Further recommendations regarding filter retrieval, continued surveillance or declaration of device permanence, can be made at that time Electronically Signed  By: Corrie Mckusick D.O.   On: 07/06/2019 16:21   VAS [place] LOWER EXTREMITY VENOUS (DVT) (ONLY [facility] & [facility])  Result Date: 07/06/2019  Lower Venous Study Indications: Pain.  Comparison Study: no prior Performing Technologist: Abram Sander RVS  Examination Guidelines: A complete evaluation includes B-mode imaging, spectral Doppler, color  Doppler, and power Doppler as needed of all accessible portions of each vessel. Bilateral testing is considered an integral part of a complete examination. Limited examinations for reoccurring indications may be performed as noted.  +---------+---------------+---------+-----------+----------+--------------+  RIGHT     Compressibility Phasicity Spontaneity Properties Thrombus Aging  +---------+---------------+---------+-----------+----------+--------------+  CFV       None            No        No                                     +---------+---------------+---------+-----------+----------+--------------+  SFJ       None                                                             +---------+---------------+---------+-----------+----------+--------------+  FV Prox   None                                                             +---------+---------------+---------+-----------+----------+--------------+  FV Mid    None                                                             +---------+---------------+---------+-----------+----------+--------------+  FV Distal None                                                             +---------+---------------+---------+-----------+----------+--------------+  PFV       None                                                             +---------+---------------+---------+-----------+----------+--------------+  POP       None            No        No                                     +---------+---------------+---------+-----------+----------+--------------+  PTV       None                                                             +---------+---------------+---------+-----------+----------+--------------+  PERO      None                                                             +---------+---------------+---------+-----------+----------+--------------+   +----+---------------+---------+-----------+----------+--------------+   LEFT Compressibility Phasicity Spontaneity Properties Thrombus Aging  +----+---------------+---------+-----------+----------+--------------+  CFV  Full            Yes       Yes                                    +----+---------------+---------+-----------+----------+--------------+     Summary: Right: Findings consistent with acute deep vein thrombosis involving the right common femoral vein, SF junction, right femoral vein, right proximal profunda vein, right popliteal vein, right posterior tibial veins, and right peroneal veins. No cystic structure found in the popliteal fossa. Attempted to visualize iliac vein. Not visualized well.  *See table(s) above for measurements and observations. Electronically signed by Harold Barban MD on 07/06/2019 at 1:45:29 PM.    Final     Microbiology: Recent Results (from the past 240 hour(s))  SARS CORONAVIRUS 2 (TAT 6-24 HRS) Nasopharyngeal Nasopharyngeal Swab     Status: None   Collection Time: 07/06/19 12:30 AM   Specimen: Nasopharyngeal Swab  Result Value Ref Range Status   SARS Coronavirus 2 NEGATIVE NEGATIVE Final    Comment: (NOTE) SARS-CoV-2 target nucleic acids are NOT DETECTED. The SARS-CoV-2 RNA is generally detectable in upper and lower respiratory specimens during the acute phase of infection. Negative results do not preclude SARS-CoV-2 infection, do not rule out co-infections with other pathogens, and should not be used as the sole basis for treatment or other patient management decisions. Negative results must be combined with clinical observations, patient history, and epidemiological information. The expected result is Negative. Fact Sheet for Patients: SugarRoll.be Fact Sheet for Healthcare Providers: https://www.woods-mathews.com/ This test is not yet approved or cleared by the [place] FDA and  has been authorized for detection and/or diagnosis of SARS-CoV-2 by FDA under an Emergency Use  Authorization (EUA). This EUA will remain  in effect (meaning this test can be used) for the duration of the COVID-19 declaration under Section 56 4(b)(1) of the Act, 21 U.S.C. section 360bbb-3(b)(1), unless the authorization is terminated or revoked sooner. Performed at [facility], [place] [address], [place],  [address]      Labs: Basic Metabolic Panel: Recent Labs  Lab 07/06/19 1102 07/07/19 0251 07/08/19 0153 07/09/19 0402 07/10/19 0330  NA 140 141 142 142 139  K 3.1* 3.2* 3.5 3.7 4.0  CL 104 107 109 110 110  CO2 27 25 24 23  21*  GLUCOSE 96 88 101* 93 93  BUN 12 12 17 19 19   CREATININE 1.31* 1.30* 1.26* 1.34* 1.22*  CALCIUM 9.5 9.2 8.9 8.4* 8.7*  MG  --  1.5* 2.1 1.8 1.8   Liver Function Tests: No results for input(s): AST, ALT, ALKPHOS, BILITOT, PROT, ALBUMIN in the last 168 hours. No results for input(s): LIPASE, AMYLASE in the last 168 hours. No results for input(s): AMMONIA in the last 168 hours. CBC: Recent Labs  Lab 07/06/19 1102 07/07/19 0251 07/08/19 0153 07/09/19 0402 07/09/19 1842 07/10/19 0330 07/11/19 0204 07/12/19 0201  WBC  4.6 5.0 4.9  --   --   --   --   --   HGB 11.1* 10.9* 9.7* 9.2* 9.7* 9.2* 9.4* 9.0*  HCT 34.6* 32.9* 30.6* 28.9* 31.2* 29.2* 29.2* 28.4*  MCV 95.3 93.5 96.2  --   --   --   --   --   PLT 166 159 175  --   --   --   --   --    Cardiac Enzymes: No results for input(s): CKTOTAL, CKMB, CKMBINDEX, TROPONINI in the last 168 hours. BNP: BNP (last 3 results) No results for input(s): BNP in the last 8760 hours.  ProBNP (last 3 results) No results for input(s): PROBNP in the last 8760 hours.  CBG: Recent Labs  Lab 07/09/19 1726  GLUCAP 99       Signed:  Kamee Bobst  [facility] 07/12/2019, 1:33 PM

## 2019-07-12 NOTE — Discharge Instructions (Signed)

## 2019-07-12 NOTE — Patient Outreach (Signed)
Telephone outreach to pt status post hospitalization for GI bleed, DVT, HTN. Pt well known to [facility] and was most recently active with Daiva Nakayama, RN, Health Coach. Now transitioning back to complex care management with this hospitalization.  Pt will be getting all services from home health. She resides with her daughter who is her primary caregiver and they also have privately paid caregivers coming in to assist.  Unable to reach pt today. She did just discharge this afternoon. I left a message and I will call again tomorrow.   Wendy Krueger. Myrtie Neither, MSN, [facility] Gerontological Nurse Practitioner [facility] Care Management [phone]

## 2019-07-12 NOTE — TOC Transition Note (Signed)
Transition of Care [facility]) - CM/SW Discharge Note   Patient Details  Name: MADLEN RAMDIAL MRN: [id number] Date of Birth: 12/14/1929  Transition of [facility]) CM/SW Contact:  Marilu Favre, RN Phone Number: 07/12/2019, 1:47 PM   Clinical Narrative:     Mateo Flow with [facility] aware discharge is today, and has orders.  Final next level of care: [place] Barriers to Discharge: Continued Medical Work up   Patient Goals and CMS Choice Patient states their goals for this hospitalization and ongoing recovery are:: to go home CMS Medicare.gov Compare Post Acute Care list provided to:: Patient Choice offered to / list presented to : Patient, Adult Children  Discharge Placement                       Discharge Plan and Services In-house Referral: Clinical Social Work Discharge Planning Services: CM Consult Post Acute Care Choice: Home Health          DME Arranged: N/A         HH Arranged: PT HH Agency: [facility] ([facility]) Date HH Agency Contacted: 07/09/19 Time [place]: [id number] Representative spoke with at [place]: [place] ([facility]) Interventions     Readmission Risk Interventions Readmission Risk Prevention Plan 07/07/2019  Post Dischage Appt Not Complete  Appt Comments pending stability  Medication Screening Complete  Transportation Screening Complete  Some recent data might be hidden

## 2019-07-13 ENCOUNTER — Ambulatory Visit (INDEPENDENT_AMBULATORY_CARE_PROVIDER_SITE_OTHER): Payer: Medicare Other | Admitting: Family Medicine

## 2019-07-13 ENCOUNTER — Telehealth: Payer: Self-pay

## 2019-07-13 ENCOUNTER — Other Ambulatory Visit: Payer: Self-pay | Admitting: *Deleted

## 2019-07-13 ENCOUNTER — Other Ambulatory Visit: Payer: Self-pay

## 2019-07-13 ENCOUNTER — Encounter: Payer: Self-pay | Admitting: Family Medicine

## 2019-07-13 VITALS — BP 126/78 | HR 84 | Temp 96.4°F | Ht 60.0 in | Wt 160.1 lb

## 2019-07-13 DIAGNOSIS — I10 Essential (primary) hypertension: Secondary | ICD-10-CM

## 2019-07-13 DIAGNOSIS — K529 Noninfective gastroenteritis and colitis, unspecified: Secondary | ICD-10-CM

## 2019-07-13 DIAGNOSIS — F0150 Vascular dementia without behavioral disturbance: Secondary | ICD-10-CM

## 2019-07-13 DIAGNOSIS — I82411 Acute embolism and thrombosis of right femoral vein: Secondary | ICD-10-CM

## 2019-07-13 DIAGNOSIS — R935 Abnormal findings on diagnostic imaging of other abdominal regions, including retroperitoneum: Secondary | ICD-10-CM

## 2019-07-13 DIAGNOSIS — K922 Gastrointestinal hemorrhage, unspecified: Secondary | ICD-10-CM | POA: Diagnosis not present

## 2019-07-13 DIAGNOSIS — F0390 Unspecified dementia without behavioral disturbance: Secondary | ICD-10-CM | POA: Diagnosis not present

## 2019-07-13 DIAGNOSIS — K625 Hemorrhage of anus and rectum: Secondary | ICD-10-CM | POA: Diagnosis not present

## 2019-07-13 DIAGNOSIS — D500 Iron deficiency anemia secondary to blood loss (chronic): Secondary | ICD-10-CM | POA: Diagnosis not present

## 2019-07-13 DIAGNOSIS — I82409 Acute embolism and thrombosis of unspecified deep veins of unspecified lower extremity: Secondary | ICD-10-CM | POA: Insufficient documentation

## 2019-07-13 NOTE — Assessment & Plan Note (Signed)
With recent GI bleed  Reviewed hospital records, lab results and studies in detail  Now much improved on soft/bland diet  Continue to watch

## 2019-07-13 NOTE — Assessment & Plan Note (Signed)
Recent GI bleed-speculating diverticular vs hemorrhoidal or both  Reviewed hospital records, lab results and studies in detail  This has now stopped and watching carefully at home  Will check cbc th or fri with HH  GI fu later this mon Continues to use anusol suppositories and also stick to soft food/bland diet

## 2019-07-13 NOTE — Patient Instructions (Addendum)
Continue soft diet and advance slowly as planned Avoid nuts, seeds and popcorn   Let us know if any more GI bleeding  Continue drinking fluids   I would like to have home health draw cbc and bmet  Thursday or Friday   Keep [place] posted   If an option - I think palliative care is a good option  No medication changes for now

## 2019-07-13 NOTE — Assessment & Plan Note (Signed)
S/p hosp for GI bleed -bp med was cut  Losartan to 50 mg and hctz to 12.5 mg  bp in fair control at this time  BP Readings from Last 1 Encounters:  07/13/19 126/78   No changes needed Most recent labs reviewed  Disc lifstyle change with low sodium diet and exercise

## 2019-07-13 NOTE — Patient Outreach (Signed)
Telephone outreach to check on pt transition home from the hospital. No answer at pt home or mobile. Called daughter, [name] home and mobile and left messages to please return my call.  Wendy Krueger. Myrtie Neither, MSN, [facility] Gerontological Nurse Practitioner [facility] Care Management [phone]

## 2019-07-13 NOTE — Assessment & Plan Note (Signed)
From recent GI bleed  Hb stable last several draws- last at 9.0  Pt recovering at home Will re check via [place] on thurs or fri

## 2019-07-13 NOTE — Progress Notes (Signed)
Subjective:    Patient ID: Wendy Krueger, female    DOB: October 12, 1929, 86 y.o.   MRN: [id number]  This visit occurred during the SARS-CoV-2 public health emergency.  Safety protocols were in place, including screening questions prior to the visit, additional usage of staff PPE, and extensive cleaning of exam room while observing appropriate contact time as indicated for disinfecting solutions.    HPI Pt presents for f/u of hospitalization from 1/4 to 07/12/19 for GI bleed   She presented with diarrhea and dark stools with abdominal pain  Then incidentally noted acute DVT of R common vemoral vein   Hospital course:   Hospital Course:  GI bleed -FOBT was positive, patient was noted to have bright red blood per rectum, however per H&P, patient was noted to have black stool and she has recently been on Pepto-Bismol. -? Question hemorrhoidal bleeding vs diverticular bleed (patient with diverticulosis without diverticulitis on imaging) -CT abdomen pelvis showed no definite acute abnormality. Questionable filling defect in the right superficial femoral vein extending into the right common femoral vein, lower extremity Doppler recommended. 2.6 cm density in the colon at the level of hepatic flexure could represent stool, mass not excluded. Large amount of stool in the rectum. Severe sigmoid diverticulosis. -Was on protonix drip and transitioned to oral -Gastroenterology consulted and appreciated, recommended continuing conservative management. No colonoscopy at this time. Continue Anusol suppositories.  They are continuing the anusol until f/u with GI  No longer had pepto     -Patient had a vasovagal episode after a bloody bowel movement on 07/09/19.  -hemoglobin 9.0- appears to be stable over the past few days. Hemoglobin was 12.9 on admission. -Dicussed with Dr. Henrene Pastor, GI, continue supportive management. Would not scope her at this time. Continue BID dosing of anusol  suppositories.May consider limited flexible sigmoidoscopy  -repeat labs in one week  Right lower extremity DVT -Noted on CT scan as above -Lower extremity Doppler: Acute DVT involving the right common femoral vein, SF junction, right proximal vein, right proximal profunda vein, right popliteal vein, right posterior tibial veins, right peroneal vein. -Given possible GI bleed, IR consulted for IVC filter -S/p basement of a retrievable IVC filter via right IJ approach. IR recommended no submersion for 7 days. Okay for anticoagulation in 3 to 6 months and can be referred to [facility] to reevaluate for filter removal.  Essential hypertension -Given possible GI bleed, losartan HCTZ currently held- resume upon discharge at half doses -Continue metoprolol -BP has been stable  Dementia -Appears stable continue Namenda and Aricept  Acute metabolic encephalopathy -Secondary to the above- patient with dementia -UA showed few bacteria, 21-50 WBC, moderate leukocytes -COVID negative -Lung exam unremarkable -Currently afebrile with no leukocytosis  Hypokalemia -Resolved with replacement  Hypomagnesemia -With replacement  Consultants Gastroenterology Interventional radiology  Procedures  Right lower extremity Doppler retrievable IVC filter via right IJ approach  Upon d/c Lab Results  Component Value Date   CREATININE 1.22 (H) 07/10/2019   BUN 19 07/10/2019   NA 139 07/10/2019   K 4.0 07/10/2019   CL 110 07/10/2019   CO2 21 (L) 07/10/2019   Lab Results  Component Value Date   ALT 12 07/05/2019   AST 25 07/05/2019   ALKPHOS 67 07/05/2019   BILITOT 0.5 07/05/2019   Lab Results  Component Value Date   WBC 4.9 07/08/2019   HGB 9.0 (L) 07/12/2019   HCT 28.4 (L) 07/12/2019   MCV 96.2 07/08/2019   PLT 175  07/08/2019   Glucose of 93 Lab Results  Component Value Date   INR 1.1 07/05/2019   INR 1.08 02/09/2018   INR 0.98 07/05/2017    [facility] program for care  management  was consulted at d/c  [facility] is coming in  (advanced)  Will get PT (possibly OT) Considering palliative care - will disc that  Can do labs if needed  Also some private pay caregivers   Today: Wt Readings from Last 3 Encounters:  07/13/19 160 lb 2 oz (72.6 kg)  07/06/19 163 lb 5.8 oz (74.1 kg)  04/19/19 165 lb (74.8 kg)   Today has gone well overall  Soft stool but not full blown diarrhea  Some right foot swelling and pain from the DVT  Doing well with the IVC filter   Need to follow her H and H   Has appt with GI on 1/21 (has seen Dr Henrene Pastor)  She takes a probiotic with fiber  Using the suppositories (anusol) for hemorrhoids   The hospital cut her losartan to 50 mg and hctz to 1/2  BP Readings from Last 3 Encounters:  07/13/19 126/78  07/12/19 132/64  04/19/19 122/74   Pulse Readings from Last 3 Encounters:  07/13/19 84  07/12/19 60  04/19/19 (!) 58    Patient Active Problem List   Diagnosis Date Noted  . DVT (deep venous thrombosis) ([place]) 07/13/2019  . Abnormal CT of the abdomen   . External hemorrhoids   . GI bleed 07/06/2019  . Transient alteration of awareness 07/14/2018  . Chronic diarrhea 02/17/2018  . History of fracture of radius 02/17/2018  . Hypokalemia 02/09/2018  . History of lower GI bleeding 02/09/2018  . Acute encephalopathy 05/08/2017  . Cerebellar stroke ([place]) 03/10/2016  . Carotid aneurysm, right ([place]) 02/21/2016  . Lipoma of shoulder 03/27/2015  . History of closed Colles' fracture 09/06/2014  . Allergic rhinitis 06/20/2014  . Diastolic dysfunction [id number]  . Iron deficiency anemia 03/25/2014  . History of GI bleed 03/21/2014  . BRBPR (bright red blood per rectum) 03/19/2014  . Dementia ([place]) 09/01/2013  . Anxiety 09/01/2013  . Encounter for Medicare annual wellness exam 01/08/2013  . Gallstones 11/11/2011  . 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 Risk for falls 06/04/2011  . Constipation 03/05/2011  . Vitamin D  deficiency 03/28/2009  . HYPERCHOLESTEROLEMIA, PURE 03/17/2007  . Essential hypertension 03/16/2007  . OSTEOARTHRITIS 03/16/2007  . Osteoporosis 03/16/2007   Past Medical History:  Diagnosis Date  . Asymptomatic gallstones   . BRBPR (bright red blood per rectum)   . Cataract 04/13/2018   bilateral eyes  . Cholelithiasis   . Colitis - presumed infectious origin    one ER visit   . Dementia ([place])   . Duodenitis   . Fall   . Gastritis   . GI bleed   . Hiatal hernia   . Hyperlipidemia   . Hypertension   . Osteoarthritis   . Osteoporosis   . Stroke ([place])   . Syncope    Past Surgical History:  Procedure Laterality Date  . ABDOMINAL HYSTERECTOMY     BSO- fibroids  . ABI's     normal  . dexa  1/05   osteoporosis  . ESOPHAGOGASTRODUODENOSCOPY N/A 03/21/2014   Procedure: ESOPHAGOGASTRODUODENOSCOPY (EGD);  Surgeon: Milus Banister, MD;  Location: [facility];  Service: Endoscopy;  Laterality: N/A;  . IR IVC FILTER PLMT / S&I /IMG GUID/MOD SED  07/06/2019  . left foot brace    .  WRIST FRACTURE SURGERY  2/01   R arm  . WRIST FRACTURE SURGERY Left 02/12/2018   Social History   Tobacco Use  . Smoking status: Never Smoker  . Smokeless tobacco: Never Used  Substance Use Topics  . Alcohol use: No    Alcohol/week: 0.0 standard drinks  . Drug use: No   Family History  Problem Relation Age of Onset  . Heart attack Father   . Hypertension Father   . Heart attack Mother   . Throat cancer Brother   . Breast cancer Daughter    Allergies  Allergen Reactions  . Alendronate Sodium Other (See Comments)    Leg pain   . Amlodipine Besylate Hives  . Calcitonin ([place]) Other (See Comments)     headache/ head pressure  . Plavix [Clopidogrel Bisulfate]     drowsy    . Simvastatin Other (See Comments)    Leg pain    Current Outpatient Medications on File Prior to Visit  Medication Sig Dispense Refill  . Cholecalciferol (VITAMIN D-3) 1000 units CAPS Take 1,000-2,000 Units by  mouth See admin instructions. Take 1000 units in the morning and 2000 in the evening    . donepezil (ARICEPT) 10 MG tablet TAKE 1 TABLET (10 MG TOTAL) BY MOUTH AT BEDTIME. 90 tablet 0  . Fexofenadine HCl (ALLERGY 24-HR PO) Take 1 capsule by mouth daily.    . hydrochlorothiazide (HYDRODIURIL) 25 MG tablet Take 0.5 tablets (12.5 mg total) by mouth daily.    . hydrocortisone (ANUSOL-HC) 2.5 % rectal cream Place 1 application rectally 2 (two) times daily as needed for hemorrhoids or anal itching. 30 g 1  . hydrocortisone (ANUSOL-HC) 25 MG suppository Place 1 suppository (25 mg total) rectally 2 (two) times daily. IF affordable. 20 suppository 0  . losartan (COZAAR) 100 MG tablet Take 0.5 tablets (50 mg total) by mouth daily.    . Melatonin 10 MG TABS Take 10 mg by mouth at bedtime.    . memantine (NAMENDA) 10 MG tablet TAKE 1 TABLET (10 MG TOTAL) BY MOUTH 2 (TWO) TIMES DAILY. 180 tablet 4  . metoprolol tartrate (LOPRESSOR) 25 MG tablet Take 1 tablet (25 mg total) by mouth 2 (two) times daily. 180 tablet 3  . Multiple Vitamins-Minerals ([employer] SPECTRAVITE PO) Take 1 capsule by mouth daily.    . pantoprazole (PROTONIX) 40 MG tablet Take 1 tablet (40 mg total) by mouth daily. 30 tablet 0  . potassium chloride (KLOR-CON M10) 10 MEQ tablet Take 2 tablets (20 mEq total) by mouth daily. (Patient taking differently: Take 20 mEq by mouth every evening. ) 180 tablet 3  . Probiotic Product (PROBIOTIC PO) Take 1 tablet by mouth daily.    . vitamin B-12 (CYANOCOBALAMIN) 1000 MCG tablet Take 1,000 mcg by mouth daily.     No current facility-administered medications on file prior to visit.     Review of Systems  Constitutional: Positive for activity change and fatigue. Negative for appetite change, fever and unexpected weight change.  HENT: Negative for congestion, ear pain, rhinorrhea, sinus pressure and sore throat.   Eyes: Negative for pain, redness and visual disturbance.  Respiratory: Negative for cough,  shortness of breath and wheezing.   Cardiovascular: Negative for chest pain and palpitations.  Gastrointestinal: Negative for abdominal distention, abdominal pain, blood in stool, constipation, diarrhea and rectal pain.       No longer blood in stool  Endocrine: Positive for cold intolerance. Negative for polydipsia and polyuria.  Genitourinary: Negative for dysuria,  frequency and urgency.  Musculoskeletal: Negative for arthralgias, back pain and myalgias.  Skin: Negative for pallor and rash.  Allergic/Immunologic: Negative for environmental allergies.  Neurological: Negative for dizziness, syncope, facial asymmetry and headaches.  Hematological: Negative for adenopathy. Does not bruise/bleed easily.  Psychiatric/Behavioral: Positive for confusion. Negative for decreased concentration and dysphoric mood. The patient is not nervous/anxious.        Dementia         Objective:   Physical Exam Constitutional:      General: She is not in acute distress.    Appearance: Normal appearance. She is well-developed. She is obese. She is not ill-appearing or diaphoretic.  HENT:     Head: Normocephalic and atraumatic.     Mouth/Throat:     Mouth: Mucous membranes are moist.  Eyes:     General: No scleral icterus.    Conjunctiva/sclera: Conjunctivae normal.     Pupils: Pupils are equal, round, and reactive to light.  Neck:     Thyroid: No thyromegaly.     Vascular: No carotid bruit or JVD.  Cardiovascular:     Rate and Rhythm: Normal rate and regular rhythm.     Heart sounds: Normal heart sounds. No gallop.   Pulmonary:     Effort: Pulmonary effort is normal. No respiratory distress.     Breath sounds: Normal breath sounds. No wheezing or rales.  Abdominal:     General: Bowel sounds are normal. There is no distension or abdominal bruit.     Palpations: Abdomen is soft. There is no mass.     Tenderness: There is no abdominal tenderness. There is no guarding or rebound.     Hernia: No  hernia is present.  Musculoskeletal:     Cervical back: Normal range of motion and neck supple.     Right lower leg: Edema present.     Left lower leg: No edema.     Comments: Trace edema of RLE to mid calf    Lymphadenopathy:     Cervical: No cervical adenopathy.  Skin:    General: Skin is warm and dry.     Coloration: Skin is pale.     Findings: No rash.  Neurological:     Mental Status: She is alert. Mental status is at baseline.     Motor: Weakness present.     Deep Tendon Reflexes: Reflexes are normal and symmetric.  Psychiatric:        Attention and Perception: She is inattentive.        Behavior: Behavior is withdrawn.        Cognition and Memory: Cognition is impaired. She exhibits impaired recent memory.     Comments: Fatigued and mildly withdrawn today  Baseline dementia            Assessment & Plan:   Problem List Items Addressed This Visit      Cardiovascular and Mediastinum   Essential hypertension (Chronic)    S/p hosp for GI bleed -bp med was cut  Losartan to 50 mg and hctz to 12.5 mg  bp in fair control at this time  BP Readings from Last 1 Encounters:  07/13/19 126/78   No changes needed Most recent labs reviewed  Disc lifstyle change with low sodium diet and exercise        DVT (deep venous thrombosis) (HCC)    RLE extensive causing some mild foot swelling  Possibly due to immobility Pt is not candidate for anticoagulation due to GI bleeding  IVC filter placed and pt doing well  Reviewed hospital records, lab results and studies in detail   Goal is to re assess possible risks of anticoag in 3-6 mo /if not, leave in place  Will ref to [facility] [facility] to re eval if desired removal        Digestive   BRBPR (bright red blood per rectum)    Recent GI bleed-speculating diverticular vs hemorrhoidal or both  Reviewed hospital records, lab results and studies in detail  This has now stopped and watching carefully at home  Will check cbc th or fri  with HH  GI fu later this mon Continues to use anusol suppositories and also stick to soft food/bland diet       Chronic diarrhea    With recent GI bleed  Reviewed hospital records, lab results and studies in detail  Now much improved on soft/bland diet  Continue to watch        GI bleed - Primary    Lower GI bleed s/p hospitalization  Reviewed hospital records, lab results and studies in detail   pepto caused dark stools (did not suspect upper)  Hemorrhoid and or diverticular dz suspected  Density seen on CT will be discussed at GI appt  Continues anusol and also bland /soft diet  Much clinical improvement          Nervous and Auditory   Dementia (HCC) (Chronic)    Fairly stable home from the hospital- did have some encephalopathy and agitation inpt and now back to baseline at home         Other   Iron deficiency anemia    From recent GI bleed  Hb stable last several draws- last at 9.0  Pt recovering at home Will re check via [place] on thurs or fri      Abnormal CT of the abdomen    From recent hosp Reviewed hospital records, lab results and studies in detail  2.6 cm density in colon at level of hepatic flexure - ? If stool or mass  bms are improved after GI bleed  F/u with GI as planned to review  Colonoscopy not attempted due to risks       Other Visit Diagnoses    Senile dementia without behavioral disturbance ([place])

## 2019-07-13 NOTE — Assessment & Plan Note (Signed)
Fairly stable home from the hospital- did have some encephalopathy and agitation inpt and now back to baseline at home

## 2019-07-13 NOTE — Telephone Encounter (Signed)
Transition Care Management Follow-up Telephone Call  Date of discharge and from where: [facility]  How have you been since you were released from the hospital? Spoke with daughter [name]) and she states that patient is doing better but is still very tired at times.   Any questions or concerns? No   Items Reviewed:  Did the pt receive and understand the discharge instructions provided? Yes   Medications obtained and verified? Yes   Any new allergies since your discharge? No   Dietary orders reviewed? Yes  Do you have support at home? Yes   Functional Questionnaire: (I = Independent and D = Dependent) ADLs: D  Bathing/Dressing- D  Meal Prep- D  Eating- D  Maintaining continence- D  Transferring/Ambulation- D  Managing Meds- D  Follow up appointments reviewed:   PCP Hospital f/u appt confirmed? Yes  Scheduled to see Dr. Glori Bickers on 07/13/2019 @ 4:15 pm.  [facility] f/u appt confirmed? Yes  Scheduled to see Gastroenterology  Are transportation arrangements needed? No   If their condition worsens, is the pt aware to call PCP or go to the Emergency Dept.? Yes  Was the patient provided with contact information for the PCP's office or ED? Yes  Was to pt encouraged to call back with questions or concerns? Yes

## 2019-07-13 NOTE — Assessment & Plan Note (Signed)
From recent hosp Reviewed hospital records, lab results and studies in detail  2.6 cm density in colon at level of hepatic flexure - ? If stool or mass  bms are improved after GI bleed  F/u with GI as planned to review  Colonoscopy not attempted due to risks

## 2019-07-13 NOTE — Assessment & Plan Note (Addendum)
RLE extensive causing some mild foot swelling  Possibly due to immobility Pt is not candidate for anticoagulation due to GI bleeding IVC filter placed and pt doing well  Reviewed hospital records, lab results and studies in detail   Goal is to re assess possible risks of anticoag in 3-6 mo /if not, leave in place  Will ref to [place] clinic to re eval if desired removal

## 2019-07-13 NOTE — Assessment & Plan Note (Signed)
Lower GI bleed s/p hospitalization  Reviewed hospital records, lab results and studies in detail   pepto caused dark stools (did not suspect upper)  Hemorrhoid and or diverticular dz suspected  Density seen on CT will be discussed at GI appt  Continues anusol and also bland /soft diet  Much clinical improvement

## 2019-07-14 ENCOUNTER — Encounter: Payer: Self-pay | Admitting: *Deleted

## 2019-07-14 ENCOUNTER — Telehealth: Payer: Self-pay

## 2019-07-14 ENCOUNTER — Telehealth: Payer: Self-pay | Admitting: Family Medicine

## 2019-07-14 ENCOUNTER — Other Ambulatory Visit: Payer: Self-pay | Admitting: *Deleted

## 2019-07-14 NOTE — Telephone Encounter (Signed)
Please verbally ok for nursing/ PT/ OT and social work Thanks

## 2019-07-14 NOTE — Patient Outreach (Addendum)
Telephone outreach for continuity of care from previous Health Coach involvement to post hospitalization complex care.  Primary care provider office does transition of care assessment. NP to follow for complex, approaching end of life (dementia) transition to palliative care if family agrees.  Daughter, [name], reports everything is organized for 24 hour care for her mother by 5 children and private paid caregivers.  We completed a MOST form today and goals of care discussion which includes acknowledgement of pt wanting to be DNR with only limited interventions if needing hospitalization. These documents are in [place].  Pt to have PT and they are hoping that her mother can regain more mobility.  We discussed palliative care as a great asset for pt and family for support and transition to Hospice if that would be needed. Would like order for consult if that has not been made.  Patient was recently discharged from hospital and all medications have been reviewed. Outpatient Encounter Medications as of 07/14/2019  Medication Sig Note  . Cholecalciferol (VITAMIN D-3) 1000 units CAPS Take 1,000-2,000 Units by mouth See admin instructions. Take 1000 units in the morning and 2000 in the evening   . donepezil (ARICEPT) 10 MG tablet TAKE 1 TABLET (10 MG TOTAL) BY MOUTH AT BEDTIME. 07/06/2019: Per daughter, provider has med on hold and not taking at all until further notice.   Marland Kitchen Fexofenadine HCl (ALLERGY 24-HR PO) Take 1 capsule by mouth daily.   . hydrochlorothiazide (HYDRODIURIL) 25 MG tablet Take 0.5 tablets (12.5 mg total) by mouth daily.   . hydrocortisone (ANUSOL-HC) 2.5 % rectal cream Place 1 application rectally 2 (two) times daily as needed for hemorrhoids or anal itching.   . hydrocortisone (ANUSOL-HC) 25 MG suppository Place 1 suppository (25 mg total) rectally 2 (two) times daily. IF affordable.   . losartan (COZAAR) 100 MG tablet Take 0.5 tablets (50 mg total) by mouth daily.   . Melatonin 10 MG  TABS Take 10 mg by mouth at bedtime.   . memantine (NAMENDA) 10 MG tablet TAKE 1 TABLET (10 MG TOTAL) BY MOUTH 2 (TWO) TIMES DAILY.   . metoprolol tartrate (LOPRESSOR) 25 MG tablet Take 1 tablet (25 mg total) by mouth 2 (two) times daily.   . Multiple Vitamins-Minerals ([employer] SPECTRAVITE PO) Take 1 capsule by mouth daily.   . pantoprazole (PROTONIX) 40 MG tablet Take 1 tablet (40 mg total) by mouth daily.   . potassium chloride (KLOR-CON M10) 10 MEQ tablet Take 2 tablets (20 mEq total) by mouth daily. (Patient taking differently: Take 20 mEq by mouth every evening. )   . Probiotic Product (PROBIOTIC PO) Take 1 tablet by mouth daily.   . vitamin B-12 (CYANOCOBALAMIN) 1000 MCG tablet Take 1,000 mcg by mouth daily.    No facility-administered encounter medications on file as of 07/14/2019.   Fall Risk  07/14/2019 04/26/2019 04/15/2019 07/29/2018 05/21/2018  Falls in the past year? 1 1 1 1 1   Comment - - - - Emmi Telephone Survey: data to providers prior to load  Number falls in past yr: 1 0 1 0 1  Comment - - - - Emmi Telephone Survey Actual Response = 2  Injury with Fall? 1 0 0 1 1  Comment Wrist and arm pain requiring and ED visit to evaluate, previous nondisplaced humeral fx.in 2019 - - wrist fx -  Risk Factor Category  - - - - -  Risk for fall due to : History of fall(s);Impaired balance/gait;Mental status change;Medication side effect Impaired balance/gait;History  of fall(s);Impaired mobility History of fall(s);Impaired balance/gait;Impaired mobility;Medication side effect - -  Follow up Falls evaluation completed Falls evaluation completed;Falls prevention discussed;Education provided Falls evaluation completed;Falls prevention discussed - -   Depression screen [facility] 2/9 07/14/2019 04/26/2019 04/15/2019 04/13/2018 04/09/2017  Decreased Interest 0 0 0 0 0  Down, Depressed, Hopeless 0 0 0 0 0  PHQ - 2 Score 0 0 0 0 0  Altered sleeping - - 0 0 0  Tired, decreased energy - - 0 0 0  Change in  appetite - - 0 0 0  Feeling bad or failure about yourself  - - 0 0 0  Trouble concentrating - - 0 0 0  Moving slowly or fidgety/restless - - 0 0 0  Suicidal thoughts - - 0 0 0  PHQ-9 Score - - 0 0 0  Difficult doing work/chores - - Not difficult at all Not difficult at all Not difficult at all   [facility] CM Care Plan Problem One     Most Recent Value  Care Plan Problem One  Patient approaching end of life (per daughter's opinion) and family in need of end of life planning and advice on caring for their mother.  Role Documenting the Problem One  Care Management [place] for Problem One  Active  [facility] Long Term Goal   Pt family will provide necessary care for comfort and services to provide care at home thoughout 2021.  [facility] Long Term Goal Start Date  07/14/19  Interventions for Problem One Long Term Goal  Discussed desires of pt and family members for the care of their mother. They have 24 hour care at present and intentions are to continue to care for her at home.  [facility] CM Short Term Goal #1   Family and pt will receive a palliative care assessment for needed servcies during the next 30 days.  [facility] CM Short Term Goal #1 Start Date  07/14/19  [facility] CM Short Term Goal #1 Met Date  07/14/19  Interventions for Short Term Goal #1  Daughter and NP discussed proposed palliative care consult, which NP encouraged and praised daughter for being open and forward thinking about how to care for their mother.    [facility] CM Care Plan Problem Two     Most Recent Value  Care Plan Problem Two  Family could use support from [place] Management team, in addition to primary care and home health until further notice or transition to embedded care manager..  Role Documenting the Problem Two  Care Management Coordinator  Care Plan for Problem Two  Active  Interventions for Problem Two Long Term Goal   Described [facility] servcies. Daughter already aware, has had previous involvement with Health Coach, Daiva Nakayama,  RN.  [facility] Long Term Goal  Pt will receive all suggested services to support Mrs. Pucket throughout 2021.  [facility] Long Term Goal Start Date  07/14/19  [facility] CM Short Term Goal #1   Complete MOST form and goals of care.  [facility] CM Short Term Goal #1 Start Date  07/14/19  [facility] CM Short Term Goal #1 Met Date   07/14/19  Interventions for Short Term Goal #2   Discussed need for documents as current POA does not cover end of life planning. We completed a MOST form and Goals of Care Discussion form.    [facility] CM Care Plan Problem Three     Most Recent Value  Care Plan Problem Three  Deconditioned, poor mobility, high risk  for falls.  Role Documenting the Problem Three  Care Management Coordinator  Care Plan for Problem Three  Active  [facility] CM Short Term Goal #1   Pt will participate in PT sessions to attempt to reach designated goals.  [facility] CM Short Term Goal #1 Start Date  07/14/19  Interventions for Short Term Goal #1  Encouraged daughter, other siblings and caregivers to participate in PT observation and learning techniquest to provide increased activity and safety for their mother.     I will call them again in one week to check on pt progress.  Eulah Pont. Myrtie Neither, MSN, [facility] Gerontological Nurse Practitioner [facility] Care Management [phone]

## 2019-07-14 NOTE — Telephone Encounter (Signed)
Verbal order given to [facility]

## 2019-07-14 NOTE — Telephone Encounter (Signed)
[facility] PT with [facility] left v/m requesting verbal orders for [facility] PT 1 x a wk for 1 wk and 2 x a wk for 8 wks for strengthening, transfers, balance and ambulation. Please advise.

## 2019-07-14 NOTE — Telephone Encounter (Signed)
Please ok those verbal orders  

## 2019-07-14 NOTE — Telephone Encounter (Signed)
Wendy Krueger with [facility] called for verbal orders   Nursing   2 times a week for 2 weeks 1 time a week for 7 weeks    She also wanted to see if she could get an okay for a Education officer, museum consult for palliative care. So she can go ahead and initiate it     Wendy Krueger mentioned she could also take the okay for the orders for PT and OT but I did not see a request for this recently   Please advice

## 2019-07-15 ENCOUNTER — Encounter: Payer: Self-pay | Admitting: *Deleted

## 2019-07-15 DIAGNOSIS — Z8719 Personal history of other diseases of the digestive system: Secondary | ICD-10-CM | POA: Diagnosis not present

## 2019-07-15 NOTE — Telephone Encounter (Signed)
Left VM giving Tharon Aquas the Verbal order

## 2019-07-19 ENCOUNTER — Encounter: Payer: Self-pay | Admitting: *Deleted

## 2019-07-19 NOTE — Telephone Encounter (Signed)
This encounter was created in error - please disregard.

## 2019-07-21 ENCOUNTER — Other Ambulatory Visit: Payer: Self-pay | Admitting: *Deleted

## 2019-07-21 ENCOUNTER — Telehealth: Payer: Self-pay | Admitting: Family Medicine

## 2019-07-21 DIAGNOSIS — Z8673 Personal history of transient ischemic attack (TIA), and cerebral infarction without residual deficits: Secondary | ICD-10-CM

## 2019-07-21 DIAGNOSIS — F0150 Vascular dementia without behavioral disturbance: Secondary | ICD-10-CM

## 2019-07-21 DIAGNOSIS — K922 Gastrointestinal hemorrhage, unspecified: Secondary | ICD-10-CM

## 2019-07-21 NOTE — Telephone Encounter (Signed)
Palliative Care Referral placed on [facility] WQ, called and spoke with Langley Gauss, they will contact the family.

## 2019-07-21 NOTE — Telephone Encounter (Signed)
Referral for palliative care   Message as follows:  Requesting PALLIATVE CARE CONSULT AND INVOLVEMENT Received: Today Message Contents  Deloria Lair, NP  Omayra Tulloch, Wynelle Fanny, MD  Good afternoon, Dr. Glori Bickers,   I've talked with Bernie Covey, Wendy Krueger's daughter for our weekly check in. She reports they have not received a call from one of the palliative care agencies to date.   [facility] would be able to provide that service. The phone number is [phone].   [name] and I both would appreciate your assistance with this referral.   Thank you, kindly,   Kayleen Memos C. Myrtie Neither, MSN, GNP-BC  Gerontological Nurse Practitioner  [facility] Care Management  [phone]    Will send to [facility]

## 2019-07-21 NOTE — Patient Outreach (Signed)
Telephone outreach:  Spoke with daughter, Wendy Krueger, today who reports her mother is still receiving home health PT. Mother is making some progress.  Pt will have a GI appt tomorrow to follow up on her GI bleed.  They have not received a Palliatve Care consult to date. Wendy Krueger has requested this consult. Will remind Dr. Glori Bickers to please make the referral.  No new needs at this time. NP will call again in another week.  Eulah Pont. Myrtie Neither, MSN, [facility] Gerontological Nurse Practitioner [facility] Care Management [phone]

## 2019-07-22 ENCOUNTER — Encounter: Payer: Self-pay | Admitting: Nurse Practitioner

## 2019-07-22 ENCOUNTER — Telehealth: Payer: Self-pay

## 2019-07-22 ENCOUNTER — Ambulatory Visit (INDEPENDENT_AMBULATORY_CARE_PROVIDER_SITE_OTHER): Payer: Medicare Other | Admitting: Nurse Practitioner

## 2019-07-22 VITALS — BP 100/60 | HR 81 | Temp 97.6°F | Ht 60.0 in | Wt 160.0 lb

## 2019-07-22 DIAGNOSIS — K625 Hemorrhage of anus and rectum: Secondary | ICD-10-CM

## 2019-07-22 DIAGNOSIS — K644 Residual hemorrhoidal skin tags: Secondary | ICD-10-CM

## 2019-07-22 DIAGNOSIS — R935 Abnormal findings on diagnostic imaging of other abdominal regions, including retroperitoneum: Secondary | ICD-10-CM

## 2019-07-22 NOTE — Progress Notes (Signed)
IMPRESSION and PLAN:    86 year old female with PMH significant for dementia, hypertension, CVA, hx of DVT no on anticoagulant, remote C. difficile infection, gastroduodenitis, hemorrhoids  Recent hospitalization for painless hematochezia / anemia  Source felt to be hemorrhoids (no evidence for ischemic colitis on CT scan and on DRE she had large external hemorrhoids).  CT scan w contrast did show large amount of stool in colon and a 2.6 cm density at hepatic flexure (stool vrs mass).  Completed 10-day course of steroid suppositories, minimal bleeding since discharge.   --No need for further steroid suppositories or topical treatment at present.  Discouraged use of any topical steroids for"maintainence" as it can thin perianal tissue.  --If bleeding continues or gets worse then daughter can repeat a course of topical steroids which she has on hand at home --Okay to continue the probiotic with fiber but I also recommended adding Metamucil since she has chronic constipation alternating with loose stools.  She can add one half dose daily --Continue to encourage good hydration --Reiterated to daughter that we are treating bleeding as being hemorrhoidal in nature.  Of course a colorectal neoplasm cannot be excluded without colonoscopy.  We are close to doing a flexible sigmoidoscopy while patient was in the hospital but bleeding improved with hemorrhoid treatment. Given patient's age and comorbidities she would be at very high risk for procedures and daughter agrees --Patient was discharged home from the hospital on Protonix.  There is apparently no history of GERD.  We did not feel patient had an upper GI bleed.  At this point I think it is safe to stop the Protonix  2. DVT . Acute DVT of right CFA, SFA junction, right femoral vein, right proximal profunda vein, right popliteal vein right posterior tibial veins, right peroneal veins  IVC filter placed 1/5.   3. Acute blood loss anemia  secondary to #1. Hemoglobin drifted from baseline of 13.4 to low 9 range, tranfusion was not required   HPI:    Primary GI: Oretha Caprice, MD      Chief complaint : Hospital follow-up for rectal bleeding   Patient is an 86 year old female with PMH as above.  She is here with her daughter for hospital follow-up.  Patient recently hospitalized with rectal bleeding.  She had very large external hemorrhoids on exam in the hospital.  She was treated for hemorrhoidal bleeding.  Given age and comorbidities lower endoscopy was not pursued.  Daughter gives a history of constipation alternating with diarrhea at home.  Therefore, we started her on fiber when she left the hospital.    Per daughter, bleeding has significantly improved she had a little bit of bleeding on Sunday and just a few smears since then.  Her bowel movements are soft 2-3 times a day.  She did not start fiber but is taking a probiotic that contains fiber.  Daughter tries to encourage more water throughout the day  Review of systems:     Unable to obtain secondary to dementia   Past Medical History:  Diagnosis Date   Asymptomatic gallstones    BRBPR (bright red blood per rectum)    Cataract 04/13/2018   bilateral eyes   Cholelithiasis    Colitis - presumed infectious origin    one ER visit    Dementia ([place])    Duodenitis    Fall    Gastritis    GI bleed    Hiatal hernia  Hyperlipidemia    Hypertension    Osteoarthritis    Osteoporosis    Stroke [facility])    Syncope     Patient's surgical history, family medical history, social history, medications and allergies were all reviewed in Epic   Serum creatinine: 1.22 mg/dL (H) 07/10/19 0330 Estimated creatinine clearance: 28.9 mL/min (A)  Current Outpatient Medications  Medication Sig Dispense Refill   Cholecalciferol (VITAMIN D-3) 1000 units CAPS Take 1,000-2,000 Units by mouth See admin instructions. Take 1000 units in the morning and 2000 in the evening      donepezil (ARICEPT) 10 MG tablet TAKE 1 TABLET (10 MG TOTAL) BY MOUTH AT BEDTIME. 90 tablet 0   Fexofenadine HCl (ALLERGY 24-HR PO) Take 1 capsule by mouth daily.     hydrochlorothiazide (HYDRODIURIL) 25 MG tablet Take 0.5 tablets (12.5 mg total) by mouth daily.     hydrocortisone (ANUSOL-HC) 2.5 % rectal cream Place 1 application rectally 2 (two) times daily as needed for hemorrhoids or anal itching. 30 g 1   losartan (COZAAR) 100 MG tablet Take 0.5 tablets (50 mg total) by mouth daily.     Melatonin 10 MG TABS Take 10 mg by mouth at bedtime.     memantine (NAMENDA) 10 MG tablet TAKE 1 TABLET (10 MG TOTAL) BY MOUTH 2 (TWO) TIMES DAILY. 180 tablet 4   metoprolol tartrate (LOPRESSOR) 25 MG tablet Take 1 tablet (25 mg total) by mouth 2 (two) times daily. 180 tablet 3   Multiple Vitamins-Minerals (CVS SPECTRAVITE PO) Take 1 capsule by mouth daily.     pantoprazole (PROTONIX) 40 MG tablet Take 1 tablet (40 mg total) by mouth daily. 30 tablet 0   potassium chloride (KLOR-CON M10) 10 MEQ tablet Take 2 tablets (20 mEq total) by mouth daily. (Patient taking differently: Take 20 mEq by mouth every evening. ) 180 tablet 3   Probiotic Product (PROBIOTIC PO) Take 1 tablet by mouth daily.     vitamin B-12 (CYANOCOBALAMIN) 1000 MCG tablet Take 1,000 mcg by mouth daily.     No current facility-administered medications for this visit.    Physical Exam:     BP 100/60    Pulse 81    Temp 97.6 F (36.4 C)    Ht 5' (1.524 m)    Wt 160 lb (72.6 kg)    BMI 31.25 kg/m   GENERAL:  Pleasant female in NAD in wheelchair PSYCH: : Cooperative CARDIAC:  RRR,  no peripheral edema PULM: Normal respiratory effor ABDOMEN:  Limited exam in wheelchair. Abdomen nondistended, soft, nontender. Normal bowel sounds  I spent 25 minutes of face-to-face time with the patient. Greater than 50% of the time was spent counseling and coordinating care. Questions answered  Tye Savoy , NP 07/22/2019, 2:47 PM

## 2019-07-22 NOTE — Telephone Encounter (Signed)
Telephone call to patients [name].  Wendy Krueger in areement with palliative team making home visit 07/28/19 at 11:30 AM.

## 2019-07-22 NOTE — Patient Instructions (Signed)
If you are age 84 or older, your body mass index should be between 23-30. Your Body mass index is 31.25 kg/m. If this is out of the aforementioned range listed, please consider follow up with your Primary Care Provider.  If you are age 89 or younger, your body mass index should be between 19-25. Your Body mass index is 31.25 kg/m. If this is out of the aformentioned range listed, please consider follow up with your Primary Care Provider.   STOP Protonix.  Take Metamucil 1/2 dose daily.  Follow up as needed.  Thank you for choosing me and [facility].   Tye Savoy, NP

## 2019-07-23 ENCOUNTER — Other Ambulatory Visit: Payer: Self-pay | Admitting: Neurology

## 2019-07-26 ENCOUNTER — Ambulatory Visit: Payer: Medicare Other | Admitting: *Deleted

## 2019-07-28 ENCOUNTER — Other Ambulatory Visit: Payer: Medicare Other

## 2019-07-28 ENCOUNTER — Other Ambulatory Visit: Payer: Self-pay

## 2019-07-28 ENCOUNTER — Encounter: Payer: Self-pay | Admitting: *Deleted

## 2019-07-28 ENCOUNTER — Other Ambulatory Visit: Payer: Self-pay | Admitting: *Deleted

## 2019-07-28 DIAGNOSIS — Z515 Encounter for palliative care: Secondary | ICD-10-CM

## 2019-07-28 NOTE — Patient Outreach (Signed)
Telephone outreach.  Spoke with daughter, [name]. She reports her mother is doing fair. They had the pallitive care consult today and have agreed to have their services.  Advised [facility] will close Wendy Krueger's case since she will be receiving services from palliative care.   Advised that she may call me however, in the future, should she have any questions or problems, but that her palliative care team or Dr Glori Bickers, should be their first call.  Eulah Pont. Myrtie Neither, MSN, [facility] Gerontological Nurse Practitioner [facility] Care Management [phone]

## 2019-07-28 NOTE — Progress Notes (Signed)
PATIENT NAME: Wendy Krueger DOB: [birth year] MRN: [id number]  PRIMARY CARE PROVIDER: Abner Greenspan, MD  RESPONSIBLE PARTY:  Acct ID - Guarantor Home Phone Work Phone Relationship Acct Type  [id number] ABBIGAYLE, [phone]  Self P/F     [address], [place], [place] [address]    PLAN OF CARE and INTERVENTIONS:               1.  GOALS OF CARE/ ADVANCE CARE PLANNING:  Family wants patient to be comfortable and remain at home.                 2.  PATIENT/CAREGIVER EDUCATION:  Education on fall precautions, education on s/s of infection, reviewed meds, support                3.  DISEASE STATUS:SW and RN made scheduled home care visit. Palliative care team met with patients daughter [name], son [name] and daughter [name] to explain palliative care services. Patient resides in her own home and children take turns caring for patient. Patient has a daughter [name] who lives in the home with patient but is working today. Patient has long-term care insurance in patient has a hired caregiver [name] in the home 6 days a week for 6 hours daily. Patient is confused and children feel like patient no longer recognize them. Patient is able to ambulate with her walker and 1 person assist.  Patient will repeat herself 3  times a lot of the time per family.  Patient denies having any pain and informs nurse she is doing well. Patient's past medical history includes dementia, hypertension, Melania, acute encephalopathy, GI bleed, and large external hemorrhoid. Patient current hemoglobin is 9 per daughter [name].  Family reports patients external hemorrhoid bleeds profusely especially if patient has loose stools. Family has instructed caregiver not to give patient corn, nuts, popcorn or strawberries or any other foods with seeds or nuts.  Family states patient had to be taken to the ER due to excessive bleeding from hemorrhoid. Family is unsure if hemorrhoid bleeds to the point patient would  Need blood if they  would want patient to receive blood. Patient remains able to feed herself if food is prepared. Patient able to take meds without difficulty. Patient also has blood clot in right leg and filter was placed at last hospitalization. Family reports patient will inform them that she is sick frequently. Family reports patient sleeps a lot and has a habit of biting her fingers. Family report patients appetite is good. Patient has no cough or shortness of breath. Family reports patient suffered with some type of seizure activity or TIA's in the past.  Patient has been sleeping more during the day per family. Patient's current weight is 160 lbs. Patients vital signs are stable. Patients son [name] and daughter [name] are patient's [place] of attorneys. Patient has a DNR form posted in the home. Nurse reviews patient's medications with daughter [name]. Family in agreement with palliative care services. Family and caregiver encouraged to contact palliative care with questions or concerns.      HISTORY OF PRESENT ILLNESS:  Patient is a 86 year old female who resides in her own home.  Patient has hired caregiver [name] in the home from [employer] 6 hours per day / 6 days per week.  Patients family also take turns staying with patient.  Patient will be seen monthly and PRN.     CODE STATUS: DNR  ADVANCED DIRECTIVES:  Y MOST FORM: Yes PPS: 50%   PHYSICAL EXAM:   VITALS: Today's Vitals   07/28/19 1200  BP: 110/60  Pulse: 80  Resp: 16  Temp: (!) 97.4 F (36.3 C)  TempSrc: Temporal  SpO2: 98%  Weight: 160 lb (72.6 kg)  Height: 5' (1.524 m)  PainSc: 0-No pain    LUNGS: clear to auscultation  CARDIAC: Cor RRR  EXTREMITIES: Trace edema SKIN: Skin color, texture, turgor normal. No rashes or lesions  NEURO: positive for gait problems and memory problems       Nilda Simmer, RN

## 2019-07-28 NOTE — Progress Notes (Signed)
COMMUNITY PALLIATIVE CARE SW NOTE  PATIENT NAME: Wendy Krueger DOB: 09-14-1929 MRN: [id number]  PRIMARY CARE PROVIDER: Abner Greenspan, MD  RESPONSIBLE PARTY:  Acct ID - Guarantor Home Phone Work Phone Relationship Acct Type  [id number] CORTASIA, [phone]  Self P/F     [phone], [place], [place] [id number]     PLAN OF CARE and INTERVENTIONS:             1. GOALS OF CARE/ ADVANCE CARE PLANNING:  Patient is DNR, form is in the home. MOST form is completed. HCPOA is Wendy Krueger and Wendy Krueger (patient's children). Goal is for comfort and quality of life. 2. SOCIAL/EMOTIONAL/SPIRITUAL ASSESSMENT/ INTERVENTIONS:  SW and RN met with patient, Wendy Krueger (patient's daughter), Wendy Krueger (patient's son) and Wendy Krueger (patient's daughter) in the home. Wendy Krueger (patient's caregiver) was present in the home during visit. Family provided brief medical history. Patient was hospitalized from 1/4 to 1/11 at [facility] for melena. Patient denies pain today. Family said patient will yell out in pain when they change her or turn her in the bed. Discussed safety. Family has made many accommodations to the home and has DME - including walker, bedside commode, etc. Wendy Krueger helps patient with a full shower twice a week. Patient's appetite is good, patient typically "snacks" throughout the day. Patient is sleeping well at night. Patient is a widow. Patient has five adult children, all local. Patient's daughter, Wendy Krueger and her grandson live in the home with patient. SW provided education on palliative care, discussed goals of care and provided emotional support.  3. PATIENT/CAREGIVER EDUCATION/ COPING:  Patient was alert, oriented to self. Patient was calm, enjoying television with Wendy Krueger. Family is supportive.  4. PERSONAL EMERGENCY PLAN:  Family/caregiver will call 9-1-1 for emergencies. 5. COMMUNITY RESOURCES COORDINATION/ HEALTH CARE NAVIGATION:  Wendy Krueger manages patient's care. Patient has neurology follow-up on Monday, 2/1.  Patient has caregiver coming for 5 hours per day, with [facility] [employer] and this is working out well per [employer]. When caregivers are not with patient, family is providing care. Patient is receiving [facility] health PT and [facility] from [facility]  6. FINANCIAL/LEGAL CONCERNS/INTERVENTIONS:  Patient is using LTC insurance benefit to pay for caregivers.     SOCIAL HX:  Social History   Tobacco Use  . Smoking status: Never Smoker  . Smokeless tobacco: Never Used  Substance Use Topics  . Alcohol use: No    Alcohol/week: 0.0 standard drinks    CODE STATUS:   Code Status: Prior (DNR) ADVANCED DIRECTIVES: Y MOST FORM COMPLETE:  Yes. HOSPICE EDUCATION PROVIDED: None.  PPS: Patient is ambulating with walker and gait belt and one person assist. Patient is able to feed herself. Patient needs assist with bathing, dressing and tolieting.   I spent 60 minutes with patient/family, from 11:40a-12:40p providing education, support and consultation.   Wendy Lombard, LCSW

## 2019-08-02 ENCOUNTER — Other Ambulatory Visit: Payer: Self-pay

## 2019-08-02 ENCOUNTER — Ambulatory Visit (INDEPENDENT_AMBULATORY_CARE_PROVIDER_SITE_OTHER): Payer: Medicare Other | Admitting: Neurology

## 2019-08-02 ENCOUNTER — Encounter: Payer: Self-pay | Admitting: Neurology

## 2019-08-02 VITALS — BP 94/63 | HR 90 | Temp 96.8°F | Ht 60.0 in | Wt 155.0 lb

## 2019-08-02 DIAGNOSIS — F0390 Unspecified dementia without behavioral disturbance: Secondary | ICD-10-CM | POA: Diagnosis not present

## 2019-08-02 DIAGNOSIS — F03C0 Unspecified dementia, severe, without behavioral disturbance, psychotic disturbance, mood disturbance, and anxiety: Secondary | ICD-10-CM | POA: Insufficient documentation

## 2019-08-02 MED ORDER — MEMANTINE HCL 10 MG PO TABS: 10.0000 mg | ORAL_TABLET | Freq: Two times a day (BID) | ORAL | 4 refills | Status: DC

## 2019-08-02 MED ORDER — DONEPEZIL HCL 10 MG PO TABS: 10.0000 mg | ORAL_TABLET | Freq: Every day | ORAL | 4 refills | Status: DC

## 2019-08-02 NOTE — Progress Notes (Signed)
[facility]    Provider:  Dr Jaynee Eagles Referring Provider: Abner Greenspan, MD Primary Care Physician:  Tower, Wynelle Fanny, MD  REASON FOR VISIT: follow up- dementia, episodes of alteration of awareness HISTORY FROM: patient  Interval history 08/02/2019: Really lovely patient is here for follow-up of her memory here with her daughter.  Patient was last seen by Ward Givens, NP almost exactly a year ago, at that time she had a caregiver coming to help 6 days a week, she needed help with dressing and bathing, still living at home with her daughter [name], she had some falls and did break her wrist at the time but she is using a walker, getting a little bit more confused but no behavioral concerns.  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  Here with [name], living with her, she had a bought in the hospital with a hemorrhoid, she is worsening, wants to know where her husband is, she needs to go home, [name] says they give her a baby doll and that helps, the sundowning can be daily and she can get agitated but they let er lay down and that helps, she will sleep in a lift chair. Daughter doesn't ant any sedating meds, she has advanced dementia. Daughter appears to be doing a great job with re-direction and at this time I would not recommend adding any medication, no falls for close to a year. They have an alarm on her bed also a baby monitor. She sleeps a lot, advanced dementia, cannot perform her own ADLs and has a crataker and daughters daily. They have PT and nursing weekly and she goes to palliative care hospice.   Interval history 07/23/2017: Memory is worsening, at night she sundowns. She sleeps more than she usually does, she can sleep 12 hours.  Mood is good. She is doing her exercises that PT showed her. She had a fall in November,  No alterations of awareness, she had a passing out  spells in the ED and her metoprolol was reduced and no more issues since then. She woke up one day with slurred speech, went to the ED, no stroke, no UTI, symptoms resolved. No choking. She does not drink fluids, may have dehydration.  No hallucinations, behavioral issues. They have a baby monitor in her room. They have a bed alarm.   HISTORY OF PRESENT ILLNESS: Miss Borck is an 86 year old female with a history of dementia and episodes of alteration of awareness. She returns today for follow-up. Her daughter is with her today and reports that she has not had any additional episodes of alteration of awareness. The patient currently lives with her daughter. She does require some assistance with ADLs. She no longer cooks meals. She does not operate a motor vehicle. Her daughter handles her finances. Denies any trouble sleeping. Her daughter reports that she has started biting her nails particularly the thumb and index finger. She  Completed physical therapy and daughter reports while they were there she did great. Family has a a hard time getting her to cooperate with him. Physical therapy suggested that she uses a walker but daughter reports that the patient refuses to use a walker and uses a cane instead. They deny any new neurological symptoms. Returns today for an evaluation.   06/06/16 Interval history: Routine and prolonged EEG were normal. Daughter provides most information. No more episodes of alteration of awareness. She is having  increased difficulty walking, transferring and performing her ADLsand worsening dementia. She is a fall risk, offered to send PT at her home due to her disability and inability to leave the home unassisted. She also needs nursing for teaching of disease process, blood pressure monitoring as it has been elevated and running high. Home inspection with evaluation of fall risks would also be helpful, she has fallen. She has dementia and worsening confusion.   Routine  EEG: Normal 72 hour ambulatory [id number] was a normal prolonged ambulatory 72-hour video EEG. No epileptiform discharges seen. No electrographic or electroclinical events present. There was no focal or background slowing seen. There were no push button events. Owing to this prolonged VEEG being normal, other, non-epileptic causes should be considered for this patient's symptoms".  [id number] C [name] a 86 y.o.femalehere as a referral from Dr. Duanne Limerick stroke and seizure. Daughters are here and they provide most information. She has a past medical history of osteoporosis, hypertension, hyperlipidemia. She has had multiple episodes of altered awareness with confusion afterwards. Daughters and patient were at a beach in the shade. They noticed patient was getting pale looking. She leaned back, her eyes rolled back and she was pale and jerking.They thought she had a urinary tract infection. But then turns out she did not. She was not dehydrated. Patient does not remember the episode. She has dementia. Both arms jerked then she came to and they gave her some water.Episode lasted a few minutes. No inciting events or head trauma. No previous illnesses. She had a similar episode on the porch a previous where her eyes rolled back and she jerked her arms.A fewdays beforehand she got up in the morning after having slept late and she didn;t recognize anyone. She was confused , took some time for her to come back to baseline, paramedics came. This happened Thursday August 10th, she woke up she was not answering questions appropriately, she kept saying the same word. Memory changes started years ago and are slowly progressive. She gets up sometines at night and starts packing. Unknown triggers for these alterations of consciousness, no other associated symptoms, no urination or defecation. No inciting events or head trauma. No family history of seizures. Patient herself has no history of seizures as far as family  and patient know.   Reviewed notes, labs and imaging from outside physicians, which showed:   Personally reviewed images of the brain and agree with following: MRI HEAD: No acute intracranial process.  New small LEFT cerebellar infarcts, otherwise stable examination including moderate chronic small vessel ischemic disease.  MRA HEAD: Motion degraded examination. 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 Possible tiny basilar tip aneurysm.  Poor flow related enhancement of the LEFT internal carotid artery, posterior cerebral arteries bilaterally, likely motion artifact.  Severe stenosis RIGHT M1 segment, moderate stenosis distal LEFT M1 segment though these may be overestimated by motion artifact.  As clinically indicated, findings would be better characterized on CTA HEAD.      Social History   Socioeconomic History  . Marital status: Widowed    Spouse name: Not on file  . Number of children: 5  . Years of education: 26  . Highest education level: Not on file  Occupational History  . Occupation: retired    Fish farm manager: RETIRED  Tobacco Use  . Smoking status: Never Smoker  . Smokeless tobacco: Never Used  Substance and Sexual Activity  . Alcohol use: No    Alcohol/week: 0.0 standard drinks  .  Drug use: No  . Sexual activity: Never  Other Topics Concern  . Not on file  Social History Narrative   Has 4 brothers, 1 sister   Lives with 1 adult grandchild and her daughter [name]   Has 4 daughters, 1 son   Right handed   Rare caffeine    Social Determinants of Health   Financial Resource Strain: Low Risk   . Difficulty of Paying Living Expenses: Not hard at all  Food Insecurity: No Food Insecurity  . Worried About Charity fundraiser in the Last Year: Never true  . Ran Out of Food in the Last Year: Never true  Transportation Needs: No Transportation Needs  . Lack of Transportation (Medical): No  . Lack of Transportation  (Non-Medical): No  Physical Activity: Inactive  . Days of Exercise per Week: 0 days  . Minutes of Exercise per Session: 0 min  Stress: No Stress Concern Present  . Feeling of Stress : Not at all  Social Connections: Moderately Isolated  . Frequency of Communication with Friends and Family: More than three times a week  . Frequency of Social Gatherings with Friends and Family: More than three times a week  . Attends Religious Services: Never  . Active Member of Clubs or Organizations: No  . Attends Archivist Meetings: Never  . Marital Status: Widowed  Intimate Partner Violence: Not At Risk  . Fear of Current or Ex-Partner: No  . Emotionally Abused: No  . Physically Abused: No  . Sexually Abused: No    Family History  Problem Relation Age of Onset  . Heart attack Father   . Hypertension Father   . Heart attack Mother   . Throat cancer Brother   . Breast cancer Daughter     Past Medical History:  Diagnosis Date  . Asymptomatic gallstones   . BRBPR (bright red blood per rectum)   . Cataract 04/13/2018   bilateral eyes  . Cholelithiasis   . Clot    RLE blood clots; s/p IVC filter  . Colitis - presumed infectious origin    one ER visit   . Dementia ([place])   . Duodenitis   . Fall   . Gastritis   . GI bleed   . Hemorrhoid    bleeding  . Hiatal hernia   . Hyperlipidemia   . Hypertension   . Osteoarthritis   . Osteoporosis   . Stroke ([place])   . Syncope     Past Surgical History:  Procedure Laterality Date  . ABDOMINAL HYSTERECTOMY     BSO- fibroids  . ABI's     normal  . dexa  1/05   osteoporosis  . ESOPHAGOGASTRODUODENOSCOPY N/A 03/21/2014   Procedure: ESOPHAGOGASTRODUODENOSCOPY (EGD);  Surgeon: Milus Banister, MD;  Location: [facility];  Service: Endoscopy;  Laterality: N/A;  . IR IVC FILTER PLMT / S&I /IMG GUID/MOD SED  07/06/2019  . IVC FILTER INSERTION  07/2019  . left foot brace    . WRIST FRACTURE SURGERY  2/01   R arm  . WRIST FRACTURE  SURGERY Left 02/12/2018    Current Outpatient Medications  Medication Sig Dispense Refill  . Chlorpheniramine Maleate (ALLERGY PO) Take by mouth.    . Cholecalciferol (VITAMIN D-3) 1000 units CAPS Take 1,000-2,000 Units by mouth See admin instructions. Take 1000 units in the morning and 2000 in the evening    . donepezil (ARICEPT) 10 MG tablet Take 1 tablet (10 mg total) by mouth at bedtime.  90 tablet 4  . hydrochlorothiazide (HYDRODIURIL) 25 MG tablet Take 0.5 tablets (12.5 mg total) by mouth daily.    . hydrocortisone (ANUSOL-HC) 2.5 % rectal cream Place 1 application rectally 2 (two) times daily as needed for hemorrhoids or anal itching. 30 g 1  . losartan (COZAAR) 100 MG tablet Take 0.5 tablets (50 mg total) by mouth daily.    . Melatonin 10 MG TABS Take 10 mg by mouth at bedtime.    . memantine (NAMENDA) 10 MG tablet Take 1 tablet (10 mg total) by mouth 2 (two) times daily. 180 tablet 4  . metoprolol tartrate (LOPRESSOR) 25 MG tablet Take 1 tablet (25 mg total) by mouth 2 (two) times daily. 180 tablet 3  . Multiple Vitamins-Minerals ([employer] SPECTRAVITE PO) Take 1 capsule by mouth daily.    . potassium chloride (KLOR-CON M10) 10 MEQ tablet Take 2 tablets (20 mEq total) by mouth daily. (Patient taking differently: Take 20 mEq by mouth every evening. ) 180 tablet 3  . Probiotic Product (PROBIOTIC PO) Take 1 tablet by mouth every other day.     . vitamin B-12 (CYANOCOBALAMIN) 1000 MCG tablet Take 1,000 mcg by mouth daily.     No current facility-administered medications for this visit.    Allergies as of 08/02/2019 - Review Complete 08/02/2019  Allergen Reaction Noted  . Alendronate sodium Other (See Comments) 03/05/2011  . Amlodipine besylate Hives 03/16/2007  . Calcitonin (salmon) Other (See Comments) 07/23/2010  . Plavix [clopidogrel bisulfate]  01/28/2019  . Simvastatin Other (See Comments) 03/05/2011    Vitals: BP 94/63 (BP Location: Right Arm, Patient Position: Sitting)   Pulse  90   Temp (!) 96.8 F (36 C) Comment: daughter 97.3; taken at front  Ht 5' (1.524 m)   Wt 155 lb (70.3 kg)   BMI 30.27 kg/m  Last Weight:  Wt Readings from Last 1 Encounters:  08/02/19 155 lb (70.3 kg)   Last Height:   Ht Readings from Last 1 Encounters:  08/02/19 5' (1.524 m)   MMSE - Mini Mental State Exam 04/15/2019 04/13/2018 04/09/2017  Not completed: Unable to complete Unable to complete (No Data)  Orientation to time - - -  Orientation to Place - - -  Registration - - -  Attention/ Calculation - - -  Recall - - -  Language- name 2 objects - - -  Language- repeat - - -  Language- follow 3 step command - - -  Language- read & follow direction - - -  Write a sentence - - -  Copy design - - -  Total score - - -   Assessment/Plan:86 year old female here for advanced dementia PMHx hypertension, hyperlipidemia. MRI of the brain showed intracranial large and small vessel atherosclerosis and infarcts likely due to atherosclerosis. 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 Here with daughters who provide all information.   - they stopped asa and plavix due to bleeding  - We discussed that aricept and memantine are likely not clinically providing much benefit but daughter would like to keep her on it.    - Patient has a carotid terminus aneurysm 8 mm last checked 2018 was relatively stable but slowly increasing. At a long discussion with patient and her daughters, I offered to refer them for cerebral angiogram and evaluation of stenting or coiling however even patient's age of 56 and her comorbidities including dementia and the risks of this procedure family decided to monitor  and control blood pressure. Still in agreement today.  - Patient was having alterations of consciousness that would be seizures in the past none recently. I did recommend starting antiepileptic medications however family declines. EEG and  prolonged EEG was without epileptiform activity. - Dementia: MMSE 13 last time we were able to check now cannot complete, advanced dementia. Discussed, provided information and where to go for Alzheimer's info such as Alzheimer's foundation online .   - continue palliative hospice care  - fall prevention discussed.    Meds ordered this encounter  Medications  . donepezil (ARICEPT) 10 MG tablet    Sig: Take 1 tablet (10 mg total) by mouth at bedtime.    Dispense:  90 tablet    Refill:  4  . memantine (NAMENDA) 10 MG tablet    Sig: Take 1 tablet (10 mg total) by mouth 2 (two) times daily.    Dispense:  180 tablet    Refill:  [place], MD  [facility] [address] [place], [place] [address]  Phone [phone] Fax [phone]  A total of 25  minutes was spent on this patient's care, reviewing imaging, past records, recent hospitalization notes and results. Over half this time was spent on counseling patient on the  1. Advanced dementia ([place])    diagnosis and different diagnostic and therapeutic options, counseling and coordination of care, risks and benefitsof management, compliance, or risk factor reduction and education.

## 2019-08-02 NOTE — Patient Instructions (Signed)
Dementia Caregiver Guide Dementia is a term used to describe a number of symptoms that affect memory and thinking. The most common symptoms include:  Memory loss.  Trouble with language and communication.  Trouble concentrating.  Poor judgment.  Problems with reasoning.  Child-like behavior and language.  Extreme anxiety.  Angry outbursts.  Wandering from home or public places. Dementia usually gets worse slowly over time. In the early stages, people with dementia can stay independent and safe with some help. In later stages, they need help with daily tasks such as dressing, grooming, and using the bathroom. How to help the person with dementia cope Dementia can be frightening and confusing. Here are some tips to help the person with dementia cope with changes caused by the disease. General tips  Keep the person on track with his or her routine.  Try to identify areas where the person may need help.  Be supportive, patient, calm, and encouraging.  Gently remind the person that adjusting to changes takes time.  Help with the tasks that the person has asked for help with.  Keep the person involved in daily tasks and decisions as much as possible.  Encourage conversation, but try not to get frustrated or harried if the person struggles to find words or does not seem to appreciate your help. Communication tips  When the person is talking or seems frustrated, make eye contact and hold the person's hand.  Ask specific questions that need yes or no answers.  Use simple words, short sentences, and a calm voice. Only give one direction at a time.  When offering choices, limit them to just 1 or 2.  Avoid correcting the person in a negative way.  If the person is struggling to find the right words, gently try to help him or her. How to recognize symptoms of stress Symptoms of stress in caregivers include:  Feeling frustrated or angry with the person with  dementia.  Denying that the person has dementia or that his or her symptoms will not improve.  Feeling hopeless and unappreciated.  Difficulty sleeping.  Difficulty concentrating.  Feeling anxious, irritable, or depressed.  Developing stress-related health problems.  Feeling like you have too little time for your own life. Follow these instructions at home:   Make sure that you and the person you are caring for: ? Get regular sleep. ? Exercise regularly. ? Eat regular, nutritious meals. ? Drink enough fluid to keep your urine clear or pale yellow. ? Take over-the-counter and prescription medicines only as told by your health care providers. ? Attend all scheduled health care appointments.  Join a support group with others who are caregivers.  Ask about respite care resources so that you can have a regular break from the stress of caregiving.  Look for signs of stress in yourself and in the person you are caring for. If you notice signs of stress, take steps to manage it.  Consider any safety risks and take steps to avoid them.  Organize medications in a pill box for each day of the week.  Create a plan to handle any legal or financial matters. Get legal or financial advice if needed.  Keep a calendar in a central location to remind the person of appointments or other activities. Tips for reducing the risk of injury  Keep floors clear of clutter. Remove rugs, magazine racks, and floor lamps.  Keep hallways well lit, especially at night.  Put a handrail and nonslip mat in the bathtub or   shower.  Put childproof locks on cabinets that contain dangerous items, such as medicines, alcohol, guns, toxic cleaning items, sharp tools or utensils, matches, and lighters.  Put the locks in places where the person cannot see or reach them easily. This will help ensure that the person does not wander out of the house and get lost.  Be prepared for emergencies. Keep a list of  emergency phone numbers and addresses in a convenient area.  Remove car keys and lock garage doors so that the person does not try to get in the car and drive.  Have the person wear a bracelet that tracks locations and identifies the person as having memory problems. This should be worn at all times for safety. Where to find support: Many individuals and organizations offer support. These include:  Support groups for people with dementia and for caregivers.  Counselors or therapists.  Home health care services.  Adult day care centers. Where to find more information Alzheimer's Association: www.alz.org Contact a health care provider if:  The person's health is rapidly getting worse.  You are no longer able to care for the person.  Caring for the person is affecting your physical and emotional health.  The person threatens himself or herself, you, or anyone else. Summary  Dementia is a term used to describe a number of symptoms that affect memory and thinking.  Dementia usually gets worse slowly over time.  Take steps to reduce the person's risk of injury, and to plan for future care.  Caregivers need support, relief from caregiving, and time for their own lives. This information is not intended to replace advice given to you by your health care provider. Make sure you discuss any questions you have with your health care provider. Document Revised: 05/30/2017 Document Reviewed: 05/21/2016 Elsevier Patient Education  2020 Elsevier Inc.  

## 2019-08-04 ENCOUNTER — Telehealth: Payer: Self-pay

## 2019-08-04 ENCOUNTER — Encounter: Payer: Self-pay | Admitting: Family Medicine

## 2019-08-04 DIAGNOSIS — D699 Hemorrhagic condition, unspecified: Secondary | ICD-10-CM | POA: Diagnosis not present

## 2019-08-04 NOTE — Telephone Encounter (Signed)
Amy with [facility], called to let Dr Glori Bickers know that patient started to bleed again on 07/31/2019 due to her hemorrhoid. GI provider told patient it was not good to use suppository treatment ong term, and just to follow and just try to control/manage the bleeding. Amy states patient's hemorrhoid size is larger than a golf ball. She has blood in her pad when she goes to the bathroom and with bowel movements. Amy wants to know if Dr Glori Bickers wanted her to check patient's hemoglobin level-last level per Amy was 9 on 07/15/19.  Also Amy reached out to Rectal clinic with [facility] in [facility] to see if they were able to do something for the patient to help with this. Get second opinion maybe. She is waiting to hear back from them. She googled this clinic. Patient's b/p today was 90/60, pulse and temp was normal.  Amy's CB is [phone]

## 2019-08-04 NOTE — Telephone Encounter (Signed)
Wendy Krueger will get labs now, pt is a hard stick so if she can't get her she will call us back

## 2019-08-04 NOTE — Telephone Encounter (Signed)
If they could get a cbc or even just a hemoglobin that would be great

## 2019-08-06 ENCOUNTER — Emergency Department: Payer: Medicare Other

## 2019-08-06 ENCOUNTER — Inpatient Hospital Stay
Admission: EM | Admit: 2019-08-06 | Discharge: 2019-08-09 | DRG: 394 | Disposition: A | Discharge status: Home / Self Care | Payer: Medicare Other | Attending: Internal Medicine | Admitting: Internal Medicine

## 2019-08-06 ENCOUNTER — Encounter: Payer: Self-pay | Admitting: Intensive Care

## 2019-08-06 ENCOUNTER — Telehealth: Payer: Self-pay

## 2019-08-06 ENCOUNTER — Telehealth: Payer: Self-pay | Admitting: Family Medicine

## 2019-08-06 ENCOUNTER — Other Ambulatory Visit: Payer: Self-pay

## 2019-08-06 DIAGNOSIS — K5730 Diverticulosis of large intestine without perforation or abscess without bleeding: Secondary | ICD-10-CM | POA: Diagnosis present

## 2019-08-06 DIAGNOSIS — Z03818 Encounter for observation for suspected exposure to other biological agents ruled out: Secondary | ICD-10-CM | POA: Diagnosis not present

## 2019-08-06 DIAGNOSIS — K625 Hemorrhage of anus and rectum: Secondary | ICD-10-CM | POA: Diagnosis not present

## 2019-08-06 DIAGNOSIS — E785 Hyperlipidemia, unspecified: Secondary | ICD-10-CM | POA: Diagnosis present

## 2019-08-06 DIAGNOSIS — F0390 Unspecified dementia without behavioral disturbance: Secondary | ICD-10-CM | POA: Diagnosis present

## 2019-08-06 DIAGNOSIS — Z8673 Personal history of transient ischemic attack (TIA), and cerebral infarction without residual deficits: Secondary | ICD-10-CM

## 2019-08-06 DIAGNOSIS — K2970 Gastritis, unspecified, without bleeding: Secondary | ICD-10-CM | POA: Diagnosis present

## 2019-08-06 DIAGNOSIS — I824Z3 Acute embolism and thrombosis of unspecified deep veins of distal lower extremity, bilateral: Secondary | ICD-10-CM | POA: Diagnosis not present

## 2019-08-06 DIAGNOSIS — H269 Unspecified cataract: Secondary | ICD-10-CM | POA: Diagnosis present

## 2019-08-06 DIAGNOSIS — I6930 Unspecified sequelae of cerebral infarction: Secondary | ICD-10-CM

## 2019-08-06 DIAGNOSIS — K642 Third degree hemorrhoids: Secondary | ICD-10-CM | POA: Diagnosis present

## 2019-08-06 DIAGNOSIS — K645 Perianal venous thrombosis: Secondary | ICD-10-CM | POA: Diagnosis present

## 2019-08-06 DIAGNOSIS — Z86718 Personal history of other venous thrombosis and embolism: Secondary | ICD-10-CM | POA: Diagnosis present

## 2019-08-06 DIAGNOSIS — K2980 Duodenitis without bleeding: Secondary | ICD-10-CM | POA: Diagnosis present

## 2019-08-06 DIAGNOSIS — F419 Anxiety disorder, unspecified: Secondary | ICD-10-CM | POA: Diagnosis present

## 2019-08-06 DIAGNOSIS — E876 Hypokalemia: Secondary | ICD-10-CM | POA: Diagnosis present

## 2019-08-06 DIAGNOSIS — E7800 Pure hypercholesterolemia, unspecified: Secondary | ICD-10-CM | POA: Diagnosis not present

## 2019-08-06 DIAGNOSIS — K922 Gastrointestinal hemorrhage, unspecified: Secondary | ICD-10-CM | POA: Diagnosis not present

## 2019-08-06 DIAGNOSIS — K635 Polyp of colon: Secondary | ICD-10-CM | POA: Diagnosis present

## 2019-08-06 DIAGNOSIS — M810 Age-related osteoporosis without current pathological fracture: Secondary | ICD-10-CM | POA: Diagnosis present

## 2019-08-06 DIAGNOSIS — I129 Hypertensive chronic kidney disease with stage 1 through stage 4 chronic kidney disease, or unspecified chronic kidney disease: Secondary | ICD-10-CM | POA: Diagnosis present

## 2019-08-06 DIAGNOSIS — Z888 Allergy status to other drugs, medicaments and biological substances status: Secondary | ICD-10-CM

## 2019-08-06 DIAGNOSIS — I10 Essential (primary) hypertension: Secondary | ICD-10-CM | POA: Diagnosis present

## 2019-08-06 DIAGNOSIS — I959 Hypotension, unspecified: Secondary | ICD-10-CM | POA: Diagnosis present

## 2019-08-06 DIAGNOSIS — N1832 Chronic kidney disease, stage 3b: Secondary | ICD-10-CM | POA: Diagnosis present

## 2019-08-06 DIAGNOSIS — K8020 Calculus of gallbladder without cholecystitis without obstruction: Secondary | ICD-10-CM | POA: Diagnosis present

## 2019-08-06 DIAGNOSIS — Z95828 Presence of other vascular implants and grafts: Secondary | ICD-10-CM

## 2019-08-06 DIAGNOSIS — K449 Diaphragmatic hernia without obstruction or gangrene: Secondary | ICD-10-CM | POA: Diagnosis present

## 2019-08-06 DIAGNOSIS — D122 Benign neoplasm of ascending colon: Secondary | ICD-10-CM | POA: Diagnosis not present

## 2019-08-06 DIAGNOSIS — I824Z2 Acute embolism and thrombosis of unspecified deep veins of left distal lower extremity: Secondary | ICD-10-CM | POA: Diagnosis present

## 2019-08-06 DIAGNOSIS — Z20822 Contact with and (suspected) exposure to covid-19: Secondary | ICD-10-CM | POA: Diagnosis present

## 2019-08-06 DIAGNOSIS — K648 Other hemorrhoids: Secondary | ICD-10-CM | POA: Diagnosis not present

## 2019-08-06 DIAGNOSIS — Z79899 Other long term (current) drug therapy: Secondary | ICD-10-CM

## 2019-08-06 DIAGNOSIS — Z808 Family history of malignant neoplasm of other organs or systems: Secondary | ICD-10-CM | POA: Diagnosis not present

## 2019-08-06 DIAGNOSIS — Z803 Family history of malignant neoplasm of breast: Secondary | ICD-10-CM

## 2019-08-06 DIAGNOSIS — K5900 Constipation, unspecified: Secondary | ICD-10-CM | POA: Diagnosis present

## 2019-08-06 DIAGNOSIS — K641 Second degree hemorrhoids: Secondary | ICD-10-CM | POA: Diagnosis not present

## 2019-08-06 DIAGNOSIS — Z8249 Family history of ischemic heart disease and other diseases of the circulatory system: Secondary | ICD-10-CM | POA: Diagnosis not present

## 2019-08-06 DIAGNOSIS — I82403 Acute embolism and thrombosis of unspecified deep veins of lower extremity, bilateral: Secondary | ICD-10-CM

## 2019-08-06 DIAGNOSIS — I720 Aneurysm of carotid artery: Secondary | ICD-10-CM | POA: Diagnosis present

## 2019-08-06 DIAGNOSIS — D62 Acute posthemorrhagic anemia: Secondary | ICD-10-CM | POA: Diagnosis present

## 2019-08-06 DIAGNOSIS — I82402 Acute embolism and thrombosis of unspecified deep veins of left lower extremity: Secondary | ICD-10-CM | POA: Diagnosis not present

## 2019-08-06 DIAGNOSIS — I82412 Acute embolism and thrombosis of left femoral vein: Secondary | ICD-10-CM | POA: Diagnosis not present

## 2019-08-06 DIAGNOSIS — I700 Atherosclerosis of aorta: Secondary | ICD-10-CM | POA: Diagnosis present

## 2019-08-06 DIAGNOSIS — I82432 Acute embolism and thrombosis of left popliteal vein: Secondary | ICD-10-CM | POA: Diagnosis not present

## 2019-08-06 DIAGNOSIS — K649 Unspecified hemorrhoids: Secondary | ICD-10-CM

## 2019-08-06 DIAGNOSIS — Z66 Do not resuscitate: Secondary | ICD-10-CM | POA: Diagnosis present

## 2019-08-06 DIAGNOSIS — I824Y3 Acute embolism and thrombosis of unspecified deep veins of proximal lower extremity, bilateral: Secondary | ICD-10-CM | POA: Diagnosis not present

## 2019-08-06 DIAGNOSIS — F0150 Vascular dementia without behavioral disturbance: Secondary | ICD-10-CM | POA: Diagnosis not present

## 2019-08-06 DIAGNOSIS — M1990 Unspecified osteoarthritis, unspecified site: Secondary | ICD-10-CM | POA: Diagnosis present

## 2019-08-06 DIAGNOSIS — K6289 Other specified diseases of anus and rectum: Secondary | ICD-10-CM | POA: Diagnosis present

## 2019-08-06 DIAGNOSIS — H9190 Unspecified hearing loss, unspecified ear: Secondary | ICD-10-CM | POA: Diagnosis present

## 2019-08-06 LAB — CBC WITH DIFFERENTIAL/PLATELET
Abs Immature Granulocytes: 0.02 10*3/uL (ref 0.00–0.07)
Basophils Absolute: 0 10*3/uL (ref 0.0–0.1)
Basophils Relative: 1 %
Eosinophils Absolute: 0.1 10*3/uL (ref 0.0–0.5)
Eosinophils Relative: 3 %
HCT: 27.8 % — ABNORMAL LOW (ref 36.0–46.0)
Hemoglobin: 8.5 g/dL — ABNORMAL LOW (ref 12.0–15.0)
Immature Granulocytes: 0 %
Lymphocytes Relative: 31 %
Lymphs Abs: 1.6 10*3/uL (ref 0.7–4.0)
MCH: 30.8 pg (ref 26.0–34.0)
MCHC: 30.6 g/dL (ref 30.0–36.0)
MCV: 100.7 fL — ABNORMAL HIGH (ref 80.0–100.0)
Monocytes Absolute: 0.5 10*3/uL (ref 0.1–1.0)
Monocytes Relative: 9 %
Neutro Abs: 3 10*3/uL (ref 1.7–7.7)
Neutrophils Relative %: 56 %
Platelets: 163 10*3/uL (ref 150–400)
RBC: 2.76 MIL/uL — ABNORMAL LOW (ref 3.87–5.11)
RDW: 14.3 % (ref 11.5–15.5)
WBC: 5.3 10*3/uL (ref 4.0–10.5)
nRBC: 0.4 % — ABNORMAL HIGH (ref 0.0–0.2)

## 2019-08-06 LAB — BASIC METABOLIC PANEL
Anion gap: 13 (ref 5–15)
BUN: 19 mg/dL (ref 8–23)
CO2: 22 mmol/L (ref 22–32)
Calcium: 9.9 mg/dL (ref 8.9–10.3)
Chloride: 104 mmol/L (ref 98–111)
Creatinine, Ser: 1.24 mg/dL — ABNORMAL HIGH (ref 0.44–1.00)
GFR calc Af Amer: 45 mL/min — ABNORMAL LOW (ref >60)
GFR calc non Af Amer: 39 mL/min — ABNORMAL LOW (ref >60)
Glucose, Bld: 87 mg/dL (ref 70–99)
Potassium: 4.5 mmol/L (ref 3.5–5.1)
Sodium: 139 mmol/L (ref 135–145)

## 2019-08-06 LAB — RESPIRATORY PANEL BY RT PCR (FLU A&B, COVID)
Influenza A by PCR: NEGATIVE
Influenza B by PCR: NEGATIVE
SARS Coronavirus 2 by RT PCR: NEGATIVE

## 2019-08-06 LAB — ABO/RH: ABO/RH(D): O POS

## 2019-08-06 MED ORDER — MEMANTINE HCL 5 MG PO TABS
10.0000 mg | ORAL_TABLET | Freq: Two times a day (BID) | ORAL | Status: DC
  Administered 2019-08-06 – 2019-08-08 (×4): 10 mg via ORAL
  Filled 2019-08-06 (×5): qty 2

## 2019-08-06 MED ORDER — MELATONIN 5 MG PO TABS
10.0000 mg | ORAL_TABLET | Freq: Every day | ORAL | Status: DC
  Administered 2019-08-06 – 2019-08-07 (×2): 10 mg via ORAL
  Filled 2019-08-06 (×3): qty 2

## 2019-08-06 MED ORDER — METOPROLOL TARTRATE 25 MG PO TABS
25.0000 mg | ORAL_TABLET | Freq: Two times a day (BID) | ORAL | Status: DC
  Administered 2019-08-06: 25 mg via ORAL
  Filled 2019-08-06 (×2): qty 1

## 2019-08-06 MED ORDER — DONEPEZIL HCL 5 MG PO TABS
10.0000 mg | ORAL_TABLET | Freq: Every day | ORAL | Status: DC
  Administered 2019-08-06 – 2019-08-07 (×2): 10 mg via ORAL
  Filled 2019-08-06 (×4): qty 2

## 2019-08-06 MED ORDER — SODIUM CHLORIDE 0.9% IV SOLUTION
Freq: Once | INTRAVENOUS | Status: DC
  Administered 2019-08-09: 10:00:00 300 mL via INTRAVENOUS

## 2019-08-06 MED ORDER — DEXTROSE-NACL 5-0.45 % IV SOLN: INTRAVENOUS | Status: DC

## 2019-08-06 NOTE — Telephone Encounter (Signed)
Spoke with daughter [name] and advise pt of Dr. Marliss Coots comments. Daughter said that they have started putting a A&D ointment on a pad and that seems to be helping. Pt had very little bleeding yesterday and seems to be back to baseline. [name] thanked Dr. Glori Bickers to doing labs and said she will let us know if they need anything else but for now pt is fine.

## 2019-08-06 NOTE — Telephone Encounter (Signed)
Wendy Krueger that pt needs to go to the ER. She said her and her brother will take her to [facility]. Advised to f/u with this office next week. Wendy Krueger verbalized understanding.

## 2019-08-06 NOTE — ED Triage Notes (Addendum)
Daughter brought patient in today for left leg pain and swelling present that started yesterday. Patient was seen by PT yesterday and was c/o left leg pain. HX dementia. Daughter reports whenever they try to get her up she cries and c/o extreme pain. HX blood clots in right leg. Taken off of blood thinners in past due to rectal bleeding.

## 2019-08-06 NOTE — Telephone Encounter (Signed)
Contacted Wendy Krueger who reports pt c/oL lower leg pain since yesterday after PT moved her leg. Pain is on the back of the leg above the knee. Denies redness or warmth. Swelling from above the knee down the leg including the L foot. Pt had PT session yesterday and when the therapist moved that leg pt c/o pain. Leg was not swollen at that time. Wendy Krueger reports pt does not have any SOB. Pt has hx of DVT and had a filter placed in the right leg about one month ago. Advised a msg would be sent to PCP and this office would f/u. Wendy Krueger verbalized understanding.

## 2019-08-06 NOTE — ED Notes (Signed)
ED MD at bedside with pt and daughter

## 2019-08-06 NOTE — Telephone Encounter (Signed)
Results rec from [facility] - CBC Pt had more hemorrhoidal bleeding and wanted to assess her anemia  Numbers are better  Hb is 9.8 and Hct 30.3  Formerly was Hb of 9.0 and Hct 28.4  Very reassuring  Please let family /caregiver know and update me re: how she is doing

## 2019-08-06 NOTE — ED Notes (Signed)
Pt is very sensitive to bodily sensations- placing cardiac leads, BP, etc. Pt screams with IV insertion and BP measurement, states "you're killing me! I'm sick as a dog." Pt comforted with low stimulus and calm demeanor. Will take BP intermittantly to allow pt to rest per daughter/pt request.

## 2019-08-06 NOTE — H&P (Addendum)
History and Physical    Wendy Krueger [id number] DOB: 1929/12/08 DOA: 08/06/2019  PCP: Abner Greenspan, MD  Patient coming from: home   Chief Complaint: left leg pain and swelling  HPI: Wendy Krueger is a 86 y.o. female with medical history significant for advanced dementia, cva, htn, carotid artery aneurysm, ckd 3, possible seizure disorder, and recent hospitalization for lower GIB and DVT, presenting with above.  Hospitalized last month for BRBPR thought to be secondary to hemorrhoid. CT scan negative for ischemic colitis. Endoscopy deferred given comorbidities. During that admission extensive DVT of right leg was diagnosed. Given active GI bleed, IVC filter was placed.  Yesterday during physical therapy at home patient complained of right leg pain. When family examined they noticed swelling and brought the patient to the ED.  History obtained from daughter due to patient's advanced dementia.  No reports of fever, chest pain, cough or shortness of breath, nausea/vomiting, or diarrhea. Stool is brown but family continues to see bright red blood with BMs, though less than previously. Appetite reported as good. No report of fevers or covid contacts.  ED Course: labs, GI and vascular surgery consults  Review of Systems: As per HPI otherwise 10 point review of systems negative.    Past Medical History:  Diagnosis Date  . Asymptomatic gallstones   . BRBPR (bright red blood per rectum)   . Cataract 04/13/2018   bilateral eyes  . Cholelithiasis   . Clot    RLE blood clots; s/p IVC filter  . Colitis - presumed infectious origin    one ER visit   . Dementia ([place])   . Duodenitis   . Fall   . Gastritis   . GI bleed   . Hemorrhoid    bleeding  . Hiatal hernia   . Hyperlipidemia   . Hypertension   . Osteoarthritis   . Osteoporosis   . Stroke ([place])   . Syncope     Past Surgical History:  Procedure Laterality Date  . ABDOMINAL HYSTERECTOMY     BSO- fibroids  .  ABI's     normal  . dexa  1/05   osteoporosis  . ESOPHAGOGASTRODUODENOSCOPY N/A 03/21/2014   Procedure: ESOPHAGOGASTRODUODENOSCOPY (EGD);  Surgeon: Milus Banister, MD;  Location: [place];  Service: Endoscopy;  Laterality: N/A;  . IR IVC FILTER PLMT / S&I /IMG GUID/MOD SED  07/06/2019  . IVC FILTER INSERTION  07/2019  . left foot brace    . WRIST FRACTURE SURGERY  2/01   R arm  . WRIST FRACTURE SURGERY Left 02/12/2018     reports that she has never smoked. She has never used smokeless tobacco. She reports that she does not drink alcohol or use drugs.  Allergies  Allergen Reactions  . Alendronate Sodium Other (See Comments)    Leg pain   . Amlodipine Besylate Hives  . Calcitonin ([place]) Other (See Comments)     headache/ head pressure  . Plavix [Clopidogrel Bisulfate]     drowsy    . Simvastatin Other (See Comments)    Leg pain     Family History  Problem Relation Age of Onset  . Heart attack Father   . Hypertension Father   . Heart attack Mother   . Throat cancer Brother   . Breast cancer Daughter     Prior to Admission medications   Medication Sig Start Date End Date Taking? Authorizing Provider  Chlorpheniramine Maleate (ALLERGY PO) Take by mouth.    [provider]  Cholecalciferol (VITAMIN D-3) 1000 units CAPS Take 1,000-2,000 Units by mouth See admin instructions. Take 1000 units in the morning and 2000 in the evening    [provider]  donepezil (ARICEPT) 10 MG tablet Take 1 tablet (10 mg total) by mouth at bedtime. 08/02/19   Melvenia Beam, MD  hydrochlorothiazide (HYDRODIURIL) 25 MG tablet Take 0.5 tablets (12.5 mg total) by mouth daily. 07/12/19   Mikhail, Velta Addison, DO  hydrocortisone (ANUSOL-HC) 2.5 % rectal cream Place 1 application rectally 2 (two) times daily as needed for hemorrhoids or anal itching. 07/12/19   Mikhail, Velta Addison, DO  losartan (COZAAR) 100 MG tablet Take 0.5 tablets (50 mg total) by mouth daily. 07/12/19   Mikhail, Velta Addison,  DO  Melatonin 10 MG TABS Take 10 mg by mouth at bedtime.    [provider]  memantine (NAMENDA) 10 MG tablet Take 1 tablet (10 mg total) by mouth 2 (two) times daily. 08/02/19   Melvenia Beam, MD  metoprolol tartrate (LOPRESSOR) 25 MG tablet Take 1 tablet (25 mg total) by mouth 2 (two) times daily. 04/19/19   Tower, Wynelle Fanny, MD  Multiple Vitamins-Minerals ([employer] SPECTRAVITE PO) Take 1 capsule by mouth daily.    [provider]  potassium chloride (KLOR-CON M10) 10 MEQ tablet Take 2 tablets (20 mEq total) by mouth daily. Patient taking differently: Take 20 mEq by mouth every evening.  04/19/19   Tower, Wynelle Fanny, MD  Probiotic Product (PROBIOTIC PO) Take 1 tablet by mouth every other day.     [provider]  vitamin B-12 (CYANOCOBALAMIN) 1000 MCG tablet Take 1,000 mcg by mouth daily.    [provider]    Physical Exam: Vitals:   08/06/19 1939 08/06/19 1940 08/06/19 2000 08/06/19 2115  BP:   98/65   Pulse: 82 88 85 86  Resp:   19 15  Temp:      TempSrc:      SpO2: 100% 100% 100% 100%  Weight:      Height:        Constitutional: No acute distress Head: Atraumatic Eyes: Conjunctiva clear ENM: Moist mucous membranes. poordentition.  Neck: Supple Respiratory: poor effort, no use of accessory muscles, faint rales at bases, otherwise clear Cardiovascular: Regular rate and rhythm. Soft systolic murmur. Distant heart sounds Abdomen: Non-tender, mildly-distended. No masses. No rebound or guarding. Positive bowel sounds. Musculoskeletal: No joint deformity upper and lower extremities. Normal ROM, no contractures. Decreased muscle tone.  GU: external hemorrhoid, large Skin: No rashes, lesions, or ulcers.  Extremities: LLE ttp at calf and mid-thigh, left calf more swollen than right Neurologic: Alert, obeys simple commands Psychiatric: demented   Labs on Admission: I have personally reviewed following labs and imaging studies  CBC: Recent Labs  Lab  08/06/19 2025  WBC 5.3  NEUTROABS 3.0  HGB 8.5*  HCT 27.8*  MCV 100.7*  PLT [id number]   Basic Metabolic Panel: Recent Labs  Lab 08/06/19 2025  NA 139  K 4.5  CL 104  CO2 22  GLUCOSE 87  BUN 19  CREATININE 1.24*  CALCIUM 9.9   GFR: Estimated Creatinine Clearance: 28 mL/min (A) (by C-G formula based on SCr of 1.24 mg/dL (H)). Liver Function Tests: No results for input(s): AST, ALT, ALKPHOS, BILITOT, PROT, ALBUMIN in the last 168 hours. No results for input(s): LIPASE, AMYLASE in the last 168 hours. No results for input(s): AMMONIA in the last 168 hours. Coagulation Profile: No results for input(s): INR, PROTIME in  the last 168 hours. Cardiac Enzymes: No results for input(s): CKTOTAL, CKMB, CKMBINDEX, TROPONINI in the last 168 hours. BNP (last 3 results) No results for input(s): PROBNP in the last 8760 hours. HbA1C: No results for input(s): HGBA1C in the last 72 hours. CBG: No results for input(s): GLUCAP in the last 168 hours. Lipid Profile: No results for input(s): CHOL, HDL, LDLCALC, TRIG, CHOLHDL, LDLDIRECT in the last 72 hours. Thyroid Function Tests: No results for input(s): TSH, T4TOTAL, FREET4, T3FREE, THYROIDAB in the last 72 hours. Anemia Panel: No results for input(s): VITAMINB12, FOLATE, FERRITIN, TIBC, IRON, RETICCTPCT in the last 72 hours. Urine analysis:    Component Value Date/Time   COLORURINE YELLOW 07/07/2019 1033   APPEARANCEUR CLEAR 07/07/2019 1033   LABSPEC 1.020 07/07/2019 1033   PHURINE 5.0 07/07/2019 1033   GLUCOSEU NEGATIVE 07/07/2019 1033   HGBUR NEGATIVE 07/07/2019 1033   BILIRUBINUR NEGATIVE 07/07/2019 1033   BILIRUBINUR Negative 02/18/2019 1658   KETONESUR NEGATIVE 07/07/2019 1033   PROTEINUR NEGATIVE 07/07/2019 1033   UROBILINOGEN 1.0 02/18/2019 1658   UROBILINOGEN 0.2 03/20/2014 0753   NITRITE NEGATIVE 07/07/2019 1033   LEUKOCYTESUR MODERATE (A) 07/07/2019 1033    Radiological Exams on Admission: US Venous Img Lower Unilateral  Left  Result Date: 08/06/2019 CLINICAL DATA:  86 year old female with left lower extremity pain and swelling. She had an IVC filter placed on 07/06/2019 EXAM: LEFT LOWER EXTREMITY VENOUS DOPPLER ULTRASOUND TECHNIQUE: Gray-scale sonography with graded compression, as well as color Doppler and duplex ultrasound were performed to evaluate the lower extremity deep venous systems from the level of the common femoral vein and including the common femoral, femoral, profunda femoral, popliteal and calf veins including the posterior tibial, peroneal and gastrocnemius veins when visible. The superficial great saphenous vein was also interrogated. Spectral Doppler was utilized to evaluate flow at rest and with distal augmentation maneuvers in the common femoral, femoral and popliteal veins. COMPARISON:  None. FINDINGS: Contralateral Common Femoral Vein: The right common femoral vein is not compressible. The lumen is filled with echogenic material and there is evidence of trace color flow on color Doppler imaging. Common Femoral Vein: The left common femoral vein is not compressible. The lumen is filled with heterogeneously echogenic material. No evidence of flow on color Doppler imaging. Findings are consistent with acute occlusive thrombus. Saphenofemoral Junction: Occlusive thrombus extends into the saphenofemoral junction. Profunda Femoral Vein: Occlusive thrombus extends into the profunda femoral vein. Femoral Vein: Occlusive thrombus extends throughout the femoral vein. Popliteal Vein: The popliteal vein is only partially compressible. There is some evidence of color flow on color Doppler imaging. Findings are most consistent with nonocclusive thrombus. Calf Veins: No evidence of color flow in the posterior tibial or peroneal veins. Superficial Great Saphenous Vein: No evidence of thrombus. Normal compressibility. Venous Reflux:  None. Other Findings: Patient body habitus allowed ultrasound evaluation of the left  external iliac vein. Thrombus appears to extend into the iliac vein. Transabdominal imaging of the IVC demonstrates that the proximal IVC is patent at the level of the liver. IMPRESSION: 1. Positive for extensive occlusive deep venous thrombosis throughout the left lower extremity extending from the external iliac vein into the calf veins. The thrombus within the popliteal vein is only partially occlusive. 2. Additionally, the patient has nearly occlusive DVT in the right common femoral vein which was imaged for comparison. The remainder of the right lower extremity was not assessed on this unilateral examination. Electronically Signed   By: Dellis Filbert.D.  On: 08/06/2019 17:04     Assessment/Plan Principal Problem:   DVT of lower extremity, bilateral (HCC) Active Problems:   Essential hypertension   Arterial ischemic stroke, chronic   Dementia (HCC)   Carotid aneurysm, right (HCC)   Bleeding hemorrhoids   DVT, lower extremity, distal, acute, bilateral ([place])   # Acute left lower extremity dvt - extensive thrombosis throughout the leg from external iliac to calf. Right common femoral vein nearly occlusive, remainder of right leg not assessed. Also with recent admit for lower GIB likely from hemorrhoids, still bleeding though not severely. ED has consulted vascular surgery and GI who will evaluate the patient in the morning. Anticoagulation needed to treat DVT and IVC filter is at risk for occlusion, but patient with active lower GI bleed. Perhaps GI can offer a therapeutic intervention such that anticoagulation can be started, but interventions also pose risks in this frail adult w/ advanced dementia. No signs of PE at the moment. - npo at midnight, fluids @ 100 - appreciate vascular and GI recs when available  # Lower GI bleed - thought to be secondary to bleeding hemorrhoids. Endoscopy thus far has been deferred. CT scan last month negative for ischemia. Today H 8.5, from 9s during most  recent hospitalization. - 2 units prbcs held - appreciate GI recs in AM  # Hypertension - here bp low normal in setting of lower GI bleeding and extensive LE thrombosis at risk for IVC filter occlusion. - hold home hctz and losartan, continue metoprolol  # Dementia - advanced, appears to be at baseline - delirium precautions - cont home donepezil and memantine and melatonin  # CKD3 - stable  # Carotid artery aneurysm # Possible seizure disorder - recent neurology visit, decision made not to treat possible seizures and not to further evaluate/treat aneurysm  DVT prophylaxis: none Code Status: dnr, confirmed w/ daughter  Family Communication: daughter brenda, updated at bedside  Disposition Plan: tbd  Consults called: vascular surgery, GI  Admission status: med/surg    Desma Maxim MD [facility] Pager [phone]  If 7PM-7AM, please contact night-coverage [email or web address] Password [facility]  08/06/2019, 9:54 PM

## 2019-08-06 NOTE — ED Notes (Signed)
ED TO INPATIENT HANDOFF REPORT  ED Nurse Name and Phone #: Renato Spellman [phone]   S Name/Age/Gender Wendy Krueger 86 y.o. female Room/Bed: ED02A/ED02A  Code Status   Code Status: Prior  Home/SNF/Other Home Patient oriented to: self Is this baseline? Yes   Triage Complete: Triage complete  Chief Complaint DVT, lower extremity, distal, acute, bilateral [facility]) [I82.4Z3]  Triage Note Daughter brought patient in today for left leg pain and swelling present that started yesterday. Patient was seen by PT yesterday and was c/o left leg pain. HX dementia. Daughter reports whenever they try to get her up she cries and c/o extreme pain. HX blood clots in right leg. Taken off of blood thinners in past due to rectal bleeding.     Allergies Allergies  Allergen Reactions  . Alendronate Sodium Other (See Comments)    Leg pain   . Amlodipine Besylate Hives  . Calcitonin ([place]) Other (See Comments)     headache/ head pressure  . Plavix [Clopidogrel Bisulfate] Other (See Comments)    drowsy    . Simvastatin Other (See Comments)    Leg pain     Level of Care/Admitting Diagnosis ED Disposition    ED Disposition Condition Comment   Admit  Hospital Area: [place] [[id number]]  Level of Care: Med-Surg [16]  Covid Evaluation: Asymptomatic Screening Protocol (No Symptoms)  Diagnosis: DVT, lower extremity, distal, acute, [id number]  Admitting Physician: Eston Esters  Attending Physician: Eston Esters  Estimated length of stay: past midnight tomorrow  Certification:: I certify this patient will need inpatient services for at least 2 midnights       B Medical/Surgery History Past Medical History:  Diagnosis Date  . Asymptomatic gallstones   . BRBPR (bright red blood per rectum)   . Cataract 04/13/2018   bilateral eyes  . Cholelithiasis   . Clot    RLE blood clots; s/p IVC filter  . Colitis - presumed infectious origin     one ER visit   . Dementia ([place])   . Duodenitis   . Fall   . Gastritis   . GI bleed   . Hemorrhoid    bleeding  . Hiatal hernia   . Hyperlipidemia   . Hypertension   . Osteoarthritis   . Osteoporosis   . Stroke ([place])   . Syncope    Past Surgical History:  Procedure Laterality Date  . ABDOMINAL HYSTERECTOMY     BSO- fibroids  . ABI's     normal  . dexa  1/05   osteoporosis  . ESOPHAGOGASTRODUODENOSCOPY N/A 03/21/2014   Procedure: ESOPHAGOGASTRODUODENOSCOPY (EGD);  Surgeon: Milus Banister, MD;  Location: [place];  Service: Endoscopy;  Laterality: N/A;  . IR IVC FILTER PLMT / S&I /IMG GUID/MOD SED  07/06/2019  . IVC FILTER INSERTION  07/2019  . left foot brace    . WRIST FRACTURE SURGERY  2/01   R arm  . WRIST FRACTURE SURGERY Left 02/12/2018     A IV Location/Drains/Wounds Patient Lines/Drains/Airways Status   Active Line/Drains/Airways    Name:   Placement date:   Placement time:   Site:   Days:   Peripheral IV 08/06/19 Right   08/06/19    2040    --   less than 1          Intake/Output Last 24 hours No intake or output data in the 24 hours ending 08/06/19 2252  Labs/Imaging Results for orders placed or  performed during the hospital encounter of 08/06/19 (from the past 48 hour(s))  CBC with Differential     Status: Abnormal   Collection Time: 08/06/19  8:25 PM  Result Value Ref Range   WBC 5.3 4.0 - 10.5 K/uL   RBC 2.76 (L) 3.87 - 5.11 MIL/uL   Hemoglobin 8.5 (L) 12.0 - 15.0 g/dL   HCT 27.8 (L) 36.0 - 46.0 %   MCV 100.7 (H) 80.0 - 100.0 fL   MCH 30.8 26.0 - 34.0 pg   MCHC 30.6 30.0 - 36.0 g/dL   RDW 14.3 11.5 - 15.5 %   Platelets 163 150 - 400 K/uL   nRBC 0.4 (H) 0.0 - 0.2 %   Neutrophils Relative % 56 %   Neutro Abs 3.0 1.7 - 7.7 K/uL   Lymphocytes Relative 31 %   Lymphs Abs 1.6 0.7 - 4.0 K/uL   Monocytes Relative 9 %   Monocytes Absolute 0.5 0.1 - 1.0 K/uL   Eosinophils Relative 3 %   Eosinophils Absolute 0.1 0.0 - 0.5 K/uL   Basophils  Relative 1 %   Basophils Absolute 0.0 0.0 - 0.1 K/uL   Immature Granulocytes 0 %   Abs Immature Granulocytes 0.02 0.00 - 0.07 K/uL    Comment: Performed at [facility], [address] [place], [place] [id number]  Basic metabolic panel     Status: Abnormal   Collection Time: 08/06/19  8:25 PM  Result Value Ref Range   Sodium 139 135 - 145 mmol/L   Potassium 4.5 3.5 - 5.1 mmol/L   Chloride 104 98 - 111 mmol/L   CO2 22 22 - 32 mmol/L   Glucose, Bld 87 70 - 99 mg/dL   BUN 19 8 - 23 mg/dL   Creatinine, Ser 1.24 (H) 0.44 - 1.00 mg/dL   Calcium 9.9 8.9 - 10.3 mg/dL   GFR calc non Af Amer 39 (L) >60 mL/min   GFR calc Af Amer 45 (L) >60 mL/min   Anion gap 13 5 - 15    Comment: Performed at [facility], [address], [place], [place] [address]   US Venous Img Lower Unilateral Left  Result Date: 08/06/2019 CLINICAL DATA:  86 year old female with left lower extremity pain and swelling. She had an IVC filter placed on 07/06/2019 EXAM: LEFT LOWER EXTREMITY VENOUS DOPPLER ULTRASOUND TECHNIQUE: Gray-scale sonography with graded compression, as well as color Doppler and duplex ultrasound were performed to evaluate the lower extremity deep venous systems from the level of the common femoral vein and including the common femoral, femoral, profunda femoral, popliteal and calf veins including the posterior tibial, peroneal and gastrocnemius veins when visible. The superficial great saphenous vein was also interrogated. Spectral Doppler was utilized to evaluate flow at rest and with distal augmentation maneuvers in the common femoral, femoral and popliteal veins. COMPARISON:  None. FINDINGS: Contralateral Common Femoral Vein: The right common femoral vein is not compressible. The lumen is filled with echogenic material and there is evidence of trace color flow on color Doppler imaging. Common Femoral Vein: The left common femoral vein is not compressible. The lumen is filled with  heterogeneously echogenic material. No evidence of flow on color Doppler imaging. Findings are consistent with acute occlusive thrombus. Saphenofemoral Junction: Occlusive thrombus extends into the saphenofemoral junction. Profunda Femoral Vein: Occlusive thrombus extends into the profunda femoral vein. Femoral Vein: Occlusive thrombus extends throughout the femoral vein. Popliteal Vein: The popliteal vein is only partially compressible. There is some evidence of  color flow on color Doppler imaging. Findings are most consistent with nonocclusive thrombus. Calf Veins: No evidence of color flow in the posterior tibial or peroneal veins. Superficial Great Saphenous Vein: No evidence of thrombus. Normal compressibility. Venous Reflux:  None. Other Findings: Patient body habitus allowed ultrasound evaluation of the left external iliac vein. Thrombus appears to extend into the iliac vein. Transabdominal imaging of the IVC demonstrates that the proximal IVC is patent at the level of the liver. IMPRESSION: 1. Positive for extensive occlusive deep venous thrombosis throughout the left lower extremity extending from the external iliac vein into the calf veins. The thrombus within the popliteal vein is only partially occlusive. 2. Additionally, the patient has nearly occlusive DVT in the right common femoral vein which was imaged for comparison. The remainder of the right lower extremity was not assessed on this unilateral examination. Electronically Signed   By: Jacqulynn Cadet M.D.   On: 08/06/2019 17:04    Pending Labs Unresulted Labs (From admission, onward)    Start     Ordered   08/06/19 2212  Type and screen [place],   STAT    Comments: [place]    08/06/19 2211   08/06/19 2212  Prepare RBC  (Adult Blood Administration - Red Blood Cells)  Once,   R    Question Answer Comment  # of Units 2 units   Transfusion Indications Actively Bleeding / GI  Bleed   If emergent release call blood bank Not emergent release      08/06/19 2211   08/06/19 2133  Respiratory Panel by RT PCR (Flu A&B, Covid) - Nasopharyngeal Swab  (Tier 2 Respiratory Panel by RT PCR (Flu A&B, Covid) (TAT 2 hrs))  Once,   STAT    Question Answer Comment  Is this test for diagnosis or screening Screening   Symptomatic for COVID-19 as defined by CDC Unknown   Hospitalized for COVID-19 Unknown   Admitted to ICU for COVID-19 Unknown   Previously tested for COVID-19 Unknown   Resident in a congregate (group) care setting Unknown   Employed in healthcare setting Unknown   Pregnant Unknown      08/06/19 2133   Signed and Held  Basic metabolic panel  Tomorrow morning,   R     Signed and Held   Signed and Held  CBC  Tomorrow morning,   R     Signed and Held          Vitals/Pain Today's Vitals   08/06/19 1940 08/06/19 2000 08/06/19 2115 08/06/19 2145  BP:  98/65    Pulse: 88 85 86 78  Resp:  19 15 (!) 21  Temp:      TempSrc:      SpO2: 100% 100% 100% 100%  Weight:      Height:        Isolation Precautions No active isolations  Medications Medications - No data to display  Mobility walks with device High fall risk   Focused Assessments Cardiac Assessment Handoff:    Lab Results  Component Value Date   CKTOTAL 90 05/09/2017   CKMB 1.8 01/05/2011   TROPONINI 0.06 (HH) 05/07/2017   Lab Results  Component Value Date   DDIMER 5.46 (H) 01/07/2011   Does the Patient currently have chest pain? No     R Recommendations: See Admitting Provider Note  Report given to:   Additional Notes:

## 2019-08-06 NOTE — Telephone Encounter (Signed)
RN returned telephone call to patients son Wendy Krueger. Wendy Krueger had called triage nurse reporting patients left leg and left foot is swollen and patient complaining of pain in left foot and leg.  . Patient recently had blood clot in right leg and had filter placed. Son States left leg has no redness or warmth to area. Son reports they have elevated patients leg above her heart to help decrease swelling.  Nurse instructed son to call patient's primary care provider to discuss as nurse concerned filter may have moved or patient could be developing another blood clot. Son verbalized understanding and appreciation for call back.

## 2019-08-06 NOTE — ED Provider Notes (Signed)
[facility] Emergency Department Provider Note   ____________________________________________   I have reviewed the triage vital signs and the nursing notes.   HISTORY  Chief Complaint Leg Pain (left)   History limited by and level 5 caveat due to: Dementia  HPI Wendy Krueger is a 86 y.o. female who presents to the emergency department today accompanied by daughter because of concern for left leg pain. Started having discomfort and some swelling yesterday. When PT tried getting her up to walk on the leg today she complained of significant pain and was not able to walk. The patient recently was diagnosed with a right lower extremity DVT and had a filter placed. Was not a candidate for blood thinning medication because of a bleeding hemorrhoid. Daughter denies any shortness of breath, cough or fever. The patient herself has significant dementia and cannot give any history.  Records reviewed. Per medical record review patient has a history of dementia, DVT. IVC filter.  Past Medical History:  Diagnosis Date  . Asymptomatic gallstones   . BRBPR (bright red blood per rectum)   . Cataract 04/13/2018   bilateral eyes  . Cholelithiasis   . Clot    RLE blood clots; s/p IVC filter  . Colitis - presumed infectious origin    one ER visit   . Dementia ([place])   . Duodenitis   . Fall   . Gastritis   . GI bleed   . Hemorrhoid    bleeding  . Hiatal hernia   . Hyperlipidemia   . Hypertension   . Osteoarthritis   . Osteoporosis   . Stroke ([place])   . Syncope     Patient Active Problem List   Diagnosis Date Noted  . Advanced dementia ([place]) 08/02/2019  . DVT (deep venous thrombosis) ([place]) 07/13/2019  . Abnormal CT of the abdomen   . External hemorrhoids   . GI bleed 07/06/2019  . Transient alteration of awareness 07/14/2018  . Chronic diarrhea 02/17/2018  . History of fracture of radius 02/17/2018  . Hypokalemia 02/09/2018  . History of lower GI bleeding  02/09/2018  . Acute encephalopathy 05/08/2017  . History of CVA (cerebrovascular accident) 03/10/2016  . Carotid aneurysm, right ([place]) 02/21/2016  . Lipoma of shoulder 03/27/2015  . History of closed Colles' fracture 09/06/2014  . Allergic rhinitis 06/20/2014  . Diastolic dysfunction [id number]  . Iron deficiency anemia 03/25/2014  . History of GI bleed 03/21/2014  . BRBPR (bright red blood per rectum) 03/19/2014  . Dementia ([place]) 09/01/2013  . Anxiety 09/01/2013  . Encounter for Medicare annual wellness exam 01/08/2013  . Gallstones 11/11/2011  . 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 Risk for falls 06/04/2011  . Constipation 03/05/2011  . Vitamin D deficiency 03/28/2009  . HYPERCHOLESTEROLEMIA, PURE 03/17/2007  . Essential hypertension 03/16/2007  . OSTEOARTHRITIS 03/16/2007  . Osteoporosis 03/16/2007    Past Surgical History:  Procedure Laterality Date  . ABDOMINAL HYSTERECTOMY     BSO- fibroids  . ABI's     normal  . dexa  1/05   osteoporosis  . ESOPHAGOGASTRODUODENOSCOPY N/A 03/21/2014   Procedure: ESOPHAGOGASTRODUODENOSCOPY (EGD);  Surgeon: Milus Banister, MD;  Location: [place];  Service: Endoscopy;  Laterality: N/A;  . IR IVC FILTER PLMT / S&I /IMG GUID/MOD SED  07/06/2019  . IVC FILTER INSERTION  07/2019  . left foot brace    . WRIST FRACTURE SURGERY  2/01   R arm  . WRIST FRACTURE SURGERY Left 02/12/2018  Prior to Admission medications   Medication Sig Start Date End Date Taking? Authorizing Provider  Chlorpheniramine Maleate (ALLERGY PO) Take by mouth.    [provider]  Cholecalciferol (VITAMIN D-3) 1000 units CAPS Take 1,000-2,000 Units by mouth See admin instructions. Take 1000 units in the morning and 2000 in the evening    [provider]  donepezil (ARICEPT) 10 MG tablet Take 1 tablet (10 mg total) by mouth at bedtime. 08/02/19   Melvenia Beam, MD  hydrochlorothiazide (HYDRODIURIL) 25 MG tablet Take 0.5 tablets (12.5  mg total) by mouth daily. 07/12/19   Mikhail, Velta Addison, DO  hydrocortisone (ANUSOL-HC) 2.5 % rectal cream Place 1 application rectally 2 (two) times daily as needed for hemorrhoids or anal itching. 07/12/19   Mikhail, Velta Addison, DO  losartan (COZAAR) 100 MG tablet Take 0.5 tablets (50 mg total) by mouth daily. 07/12/19   Mikhail, Velta Addison, DO  Melatonin 10 MG TABS Take 10 mg by mouth at bedtime.    [provider]  memantine (NAMENDA) 10 MG tablet Take 1 tablet (10 mg total) by mouth 2 (two) times daily. 08/02/19   Melvenia Beam, MD  metoprolol tartrate (LOPRESSOR) 25 MG tablet Take 1 tablet (25 mg total) by mouth 2 (two) times daily. 04/19/19   Tower, Wynelle Fanny, MD  Multiple Vitamins-Minerals ([employer] SPECTRAVITE PO) Take 1 capsule by mouth daily.    [provider]  potassium chloride (KLOR-CON M10) 10 MEQ tablet Take 2 tablets (20 mEq total) by mouth daily. Patient taking differently: Take 20 mEq by mouth every evening.  04/19/19   Tower, Wynelle Fanny, MD  Probiotic Product (PROBIOTIC PO) Take 1 tablet by mouth every other day.     [provider]  vitamin B-12 (CYANOCOBALAMIN) 1000 MCG tablet Take 1,000 mcg by mouth daily.    [provider]    Allergies Alendronate sodium, Amlodipine besylate, Calcitonin (sal[place]), Plavix [clopidogrel bisulfate], and Simvastatin  Family History  Problem Relation Age of Onset  . Heart attack Father   . Hypertension Father   . Heart attack Mother   . Throat cancer Brother   . Breast cancer Daughter     Social History Social History   Tobacco Use  . Smoking status: Never Smoker  . Smokeless tobacco: Never Used  Substance Use Topics  . Alcohol use: No    Alcohol/week: 0.0 standard drinks  . Drug use: No    Review of Systems Unable to obtain secondary to dementia ____________________________________________   PHYSICAL EXAM:  VITAL SIGNS: ED Triage Vitals  Enc Vitals Group     BP 08/06/19 1555 110/72     Pulse Rate  08/06/19 1555 (!) 103     Resp 08/06/19 1555 18     Temp 08/06/19 1555 97.6 F (36.4 C)     Temp Source 08/06/19 1555 Oral     SpO2 08/06/19 1555 99 %     Weight 08/06/19 1556 155 lb (70.3 kg)     Height 08/06/19 1556 5' (1.524 m)   Constitutional: Awake and alert. Not oriented.  Eyes: Conjunctivae are normal.  ENT      Head: Normocephalic and atraumatic.      Nose: No congestion/rhinnorhea.      Mouth/Throat: Mucous membranes are moist.      Neck: No stridor. Hematological/Lymphatic/Immunilogical: No cervical lymphadenopathy. Cardiovascular: Normal rate, regular rhythm.  No murmurs, rubs, or gallops.  Respiratory: Normal respiratory effort without tachypnea nor retractions. Breath sounds are clear and equal bilaterally. No wheezes/rales/rhonchi.  Gastrointestinal: Soft and non tender. No rebound. No guarding.  Genitourinary: Deferred Musculoskeletal: Slight swelling and tenderness to the left lower extremity. No skin changes. DP 2+ Neurologic:  Dementia. Moving all extremities.  Skin:  Skin is warm, dry and intact. No rash noted. Psychiatric: Mood and affect are normal. Speech and behavior are normal. Patient exhibits appropriate insight and judgment.  ____________________________________________    LABS (pertinent positives/negatives)  BMP wnl except cr 1.24 CBC wbc 5.3, hgb 8.5, plt 163  ____________________________________________   EKG  None  ____________________________________________    RADIOLOGY  [place] Left lower extremity Extensive DVT  ____________________________________________   PROCEDURES  Procedures  ____________________________________________   INITIAL IMPRESSION / ASSESSMENT AND PLAN / ED COURSE  Pertinent labs & imaging results that were available during my care of the patient were reviewed by me and considered in my medical decision making (see chart for details).   Patient presents to the emergency department today because of concerns  for left lower leg swelling and discomfort.  Patient's ultrasound did show extensive DVT from the external iliac to the calf veins.  Given current GI bleed patient would not be a candidate for anticoagulation.  Will plan on admission for GI and vascular surgery evaluation.  Discussed plan with patient and daughter.   ____________________________________________   FINAL CLINICAL IMPRESSION(S) / ED DIAGNOSES  Final diagnoses:  Gastrointestinal hemorrhage, unspecified gastrointestinal hemorrhage type  Acute deep vein thrombosis (DVT) of distal vein of left lower extremity (HCC)     Note: This dictation was prepared with Dragon dictation. Any transcriptional errors that result from this process are unintentional     Nance Pear, MD 08/06/19 2329

## 2019-08-06 NOTE — Telephone Encounter (Signed)
[name], (on DPR on file), l/m stating that patient developed left leg pain and swelling from left knee down to her foot. They are not sure if it is a blood clot? No further information was given. Please review.  Sending for triage

## 2019-08-06 NOTE — Telephone Encounter (Signed)
She needs to be evaluated in the ED-please take her

## 2019-08-06 NOTE — ED Notes (Signed)
Report from lexi, rn.  

## 2019-08-07 DIAGNOSIS — I824Z2 Acute embolism and thrombosis of unspecified deep veins of left distal lower extremity: Secondary | ICD-10-CM

## 2019-08-07 DIAGNOSIS — F0150 Vascular dementia without behavioral disturbance: Secondary | ICD-10-CM

## 2019-08-07 DIAGNOSIS — N1832 Chronic kidney disease, stage 3b: Secondary | ICD-10-CM

## 2019-08-07 DIAGNOSIS — D62 Acute posthemorrhagic anemia: Secondary | ICD-10-CM

## 2019-08-07 DIAGNOSIS — I959 Hypotension, unspecified: Secondary | ICD-10-CM

## 2019-08-07 LAB — BASIC METABOLIC PANEL
Anion gap: 7 (ref 5–15)
BUN: 16 mg/dL (ref 8–23)
CO2: 26 mmol/L (ref 22–32)
Calcium: 9.3 mg/dL (ref 8.9–10.3)
Chloride: 106 mmol/L (ref 98–111)
Creatinine, Ser: 1.16 mg/dL — ABNORMAL HIGH (ref 0.44–1.00)
GFR calc Af Amer: 49 mL/min — ABNORMAL LOW (ref >60)
GFR calc non Af Amer: 42 mL/min — ABNORMAL LOW (ref >60)
Glucose, Bld: 107 mg/dL — ABNORMAL HIGH (ref 70–99)
Potassium: 3.3 mmol/L — ABNORMAL LOW (ref 3.5–5.1)
Sodium: 139 mmol/L (ref 135–145)

## 2019-08-07 LAB — CBC
HCT: 22.5 % — ABNORMAL LOW (ref 36.0–46.0)
Hemoglobin: 7 g/dL — ABNORMAL LOW (ref 12.0–15.0)
MCH: 31.1 pg (ref 26.0–34.0)
MCHC: 31.1 g/dL (ref 30.0–36.0)
MCV: 100 fL (ref 80.0–100.0)
Platelets: 151 10*3/uL (ref 150–400)
RBC: 2.25 MIL/uL — ABNORMAL LOW (ref 3.87–5.11)
RDW: 14.2 % (ref 11.5–15.5)
WBC: 4.3 10*3/uL (ref 4.0–10.5)
nRBC: 0 % (ref 0.0–0.2)

## 2019-08-07 LAB — PREPARE RBC (CROSSMATCH)

## 2019-08-07 LAB — IRON AND TIBC
Iron: 26 ug/dL — ABNORMAL LOW (ref 28–170)
Saturation Ratios: 10 % — ABNORMAL LOW (ref 10.4–31.8)
TIBC: 256 ug/dL (ref 250–450)
UIBC: 230 ug/dL

## 2019-08-07 LAB — FERRITIN: Ferritin: 27 ng/mL (ref 11–307)

## 2019-08-07 MED ORDER — FLEET ENEMA 7-19 GM/118ML RE ENEM
1.0000 | ENEMA | Freq: Once | RECTAL | Status: AC
  Administered 2019-08-07: 1 via RECTAL

## 2019-08-07 MED ORDER — ACETAMINOPHEN 325 MG PO TABS
650.0000 mg | ORAL_TABLET | Freq: Once | ORAL | Status: AC
  Administered 2019-08-07: 650 mg via ORAL
  Filled 2019-08-07: qty 2

## 2019-08-07 MED ORDER — SODIUM CHLORIDE 0.9% IV SOLUTION: Freq: Once | INTRAVENOUS | Status: AC

## 2019-08-07 MED ORDER — HYDROCORTISONE ACETATE 25 MG RE SUPP
25.0000 mg | Freq: Two times a day (BID) | RECTAL | Status: DC
  Administered 2019-08-07: 25 mg via RECTAL
  Filled 2019-08-07 (×5): qty 1

## 2019-08-07 MED ORDER — SODIUM CHLORIDE 0.9 % IV SOLN: INTRAVENOUS | Status: DC

## 2019-08-07 NOTE — Consult Note (Signed)
Jonathon Bellows , MD [address] [place], [place], [place], [address] [address], [place], [place], [place], [address] Phone: [phone]  Fax: [phone]  Consultation  Referring Provider:     Dr Leslye Peer Primary Care Physician:  Tower, Wynelle Fanny, MD Primary Gastroenterologist:  [facility]         Reason for Consultation:     Rectal bleeding   Date of Admission:  08/06/2019 Date of Consultation:  08/07/2019         HPI:   TIAH Wendy Krueger is a 86 y.o. female has a history of dementia hypertension CVA.  Follows with [facility] and was last seen at their office on 07/22/2019.  She had a recent hospitalization for painless rectal bleeding and anemia.  Source was felt to be due to hemorrhoids.  This was based on rectal exam.  Treated conservatively with steroid suppositories.  Recent hospitalization noted to have acute DVT of her right leg.  At some point sigmoidoscopy was considered but was deferred due to patient improving and the risks associated with the same.  Patient has not been able to give any history in the past. At her last hospitalization IVC filter was placed.  The patient presented to the hospital last night with left leg pain and swelling.  Per admission note family has noted bright red blood with bowel movements.  CT scan of the abdomen on 07/05/2019 demonstrated questionable filling defect within the right superficial femoral vein extending into the right common femoral vein.  Rounded opacity 2.6 cm density in the colon the level of the hepatic flexure could represent stool or mass.  Large amount of stool in the rectum severe sigmoid diverticulosis without CT evidence of diverticulitis.  Large hiatal hernia.   3 weeks prior hemoglobin was 9 g on admission yesterday was 8.5 g and hemoglobin this morning is 7 g.  BUN was 16 and creatinine of 1.16.   When I went to see the patient in the room she was having her daughter in my presence.  The patient cannot give any history she appears  to be comfortable.  According to the daughter they have noticed bleeding from the external aspect of the anus and some blood on top of the stools ongoing on and off for the recent weeks.  It usually not mixed with the stool is usually on the stool or on the toilet paper or in her underwear.  In terms of activities of daily living the patient can dress herself sometimes walk and recognize people at times.  No significant change in quality of life recently. Past Medical History:  Diagnosis Date  . Asymptomatic gallstones   . BRBPR (bright red blood per rectum)   . Cataract 04/13/2018   bilateral eyes  . Cholelithiasis   . Clot    RLE blood clots; s/p IVC filter  . Colitis - presumed infectious origin    one ER visit   . Dementia ([place])   . Duodenitis   . Fall   . Gastritis   . GI bleed   . Hemorrhoid    bleeding  . Hiatal hernia   . Hyperlipidemia   . Hypertension   . Osteoarthritis   . Osteoporosis   . Stroke ([place])   . Syncope     Past Surgical History:  Procedure Laterality Date  . ABDOMINAL HYSTERECTOMY     BSO- fibroids  . ABI's     normal  . dexa  1/05   osteoporosis  .  ESOPHAGOGASTRODUODENOSCOPY N/A 03/21/2014   Procedure: ESOPHAGOGASTRODUODENOSCOPY (EGD);  Surgeon: Milus Banister, MD;  Location: [facility];  Service: Endoscopy;  Laterality: N/A;  . IR IVC FILTER PLMT / S&I /IMG GUID/MOD SED  07/06/2019  . IVC FILTER INSERTION  07/2019  . left foot brace    . WRIST FRACTURE SURGERY  2/01   R arm  . WRIST FRACTURE SURGERY Left 02/12/2018    Prior to Admission medications   Medication Sig Start Date End Date Taking? Authorizing Provider  Chlorpheniramine Maleate (ALLERGY PO) Take 1 tablet by mouth daily.    Yes [provider]  Cholecalciferol (VITAMIN D-3) 1000 units CAPS Take 1,000-2,000 Units by mouth See admin instructions. Take 1000 units in the morning and 2000 in the evening   Yes [provider]  donepezil (ARICEPT) 10 MG tablet Take 1  tablet (10 mg total) by mouth at bedtime. 08/02/19  Yes [place]ven[place] Beam, MD  hydrochlorothiazide (HYDRODIURIL) 25 MG tablet Take 0.5 tablets (12.5 mg total) by mouth daily. 07/12/19  Yes Mikhail, Velta Addison, DO  hydrocortisone (ANUSOL-HC) 2.5 % rectal cream Place 1 application rectally 2 (two) times daily as needed for hemorrhoids or anal itching. 07/12/19  Yes Mikhail, Velta Addison, DO  losartan (COZAAR) 100 MG tablet Take 0.5 tablets (50 mg total) by mouth daily. 07/12/19  Yes Mikhail, Velta Addison, DO  Melatonin 10 MG TABS Take 10 mg by mouth at bedtime.   Yes [provider]  memantine (NAMENDA) 10 MG tablet Take 1 tablet (10 mg total) by mouth 2 (two) times daily. 08/02/19  Yes Melven[place] Beam, MD  metoprolol tartrate (LOPRESSOR) 25 MG tablet Take 1 tablet (25 mg total) by mouth 2 (two) times daily. 04/19/19  Yes Tower, Wynelle Fanny, MD  Multiple Vitamins-Minerals ([employer] SPECTRAVITE PO) Take 1 capsule by mouth daily.   Yes [provider]  potassium chloride (KLOR-CON M10) 10 MEQ tablet Take 2 tablets (20 mEq total) by mouth daily. Patient taking differently: Take 20 mEq by mouth every evening.  04/19/19  Yes Tower, Wynelle Fanny, MD  Probiotic Product (PROBIOTIC PO) Take 1 tablet by mouth every other day.    Yes [provider]  vitamin B-12 (CYANOCOBALAMIN) 1000 MCG tablet Take 1,000 mcg by mouth daily.   Yes [provider]    Family History  Problem Relation Age of Onset  . Heart attack Father   . Hypertension Father   . Heart attack Mother   . Throat cancer Brother   . Breast cancer Daughter      Social History   Tobacco Use  . Smoking status: Never Smoker  . Smokeless tobacco: Never Used  Substance Use Topics  . Alcohol use: No    Alcohol/week: 0.0 standard drinks  . Drug use: No    Allergies as of 08/06/2019 - Review Complete 08/06/2019  Allergen Reaction Noted  . Alendronate sodium Other (See Comments) 03/05/2011  . Amlodipine besylate Hives 03/16/2007  .  Calcitonin (salmon) Other (See Comments) 07/23/2010  . Plavix [clopidogrel bisulfate] Other (See Comments) 01/28/2019  . Simvastatin Other (See Comments) 03/05/2011    Review of Systems:    Patient unable to provide due to dementia   Physical Exam:  Vital signs in last 24 hours: Temp:  [97.6 F (36.4 C)-97.9 F (36.6 C)] 97.6 F (36.4 C) (02/06 0448) Pulse Rate:  [62-103] 62 (02/06 0946) Resp:  [13-21] 16 (02/06 0448) BP: (90-132)/(56-96) 90/56 (02/06 0946) SpO2:  [87 %-100 %] 98 % (02/06 0448) Weight:  [  69.7 kg-70.3 kg] 69.7 kg (02/05 2332)   General:   Pleasant, cooperative in NAD Head:  Normocephalic and atraumatic. Eyes:   No icterus.   Conjunctiva pink. PERRLA. Ears:  Normal auditory acuity. Neck:  Supple; no masses or thyroidomegaly Lungs: Respirations even and unlabored. Lungs clear to auscultation bilaterally.   No wheezes, crackles, or rhonchi.  Heart:  Regular rate and rhythm;  Without murmur, clicks, rubs or gallops Abdomen:  Soft, nondistended, nontender. Normal bowel sounds. No appreciable masses or hepatomegaly.  No rebound or guarding.  Neurologic:  Alert and oriented x3;  grossly normal neurologically. Psych:  Alert and cooperative. Normal affect.  LAB RESULTS: Recent Labs    08/06/19 2025 08/07/19 0634  WBC 5.3 4.3  HGB 8.5* 7.0*  HCT 27.8* 22.5*  PLT 163 151   BMET Recent Labs    08/06/19 2025 08/07/19 0634  NA 139 139  K 4.5 3.3*  CL 104 106  CO2 22 26  GLUCOSE 87 107*  BUN 19 16  CREATININE 1.24* 1.16*  CALCIUM 9.9 9.3   LFT No results for input(s): PROT, ALBUMIN, AST, ALT, ALKPHOS, BILITOT, BILIDIR, IBILI in the last 72 hours. PT/INR No results for input(s): LABPROT, INR in the last 72 hours.  STUDIES: US Venous Img Lower Unilateral Left  Result Date: 08/06/2019 CLINICAL DATA:  86 year old female with left lower extremity pain and swelling. She had an IVC filter placed on 07/06/2019 EXAM: LEFT LOWER EXTREMITY VENOUS DOPPLER ULTRASOUND  TECHNIQUE: Gray-scale sonography with graded compression, as well as color Doppler and duplex ultrasound were performed to evaluate the lower extremity deep venous systems from the level of the common femoral vein and including the common femoral, femoral, profunda femoral, popliteal and calf veins including the posterior tibial, peroneal and gastrocnemius veins when visible. The superficial great saphenous vein was also interrogated. Spectral Doppler was utilized to evaluate flow at rest and with distal augmentation maneuvers in the common femoral, femoral and popliteal veins. COMPARISON:  None. FINDINGS: Contralateral Common Femoral Vein: The right common femoral vein is not compressible. The lumen is filled with echogenic material and there is evidence of trace color flow on color Doppler imaging. Common Femoral Vein: The left common femoral vein is not compressible. The lumen is filled with heterogeneously echogenic material. No evidence of flow on color Doppler imaging. Findings are consistent with acute occlusive thrombus. Saphenofemoral Junction: Occlusive thrombus extends into the saphenofemoral junction. Profunda Femoral Vein: Occlusive thrombus extends into the profunda femoral vein. Femoral Vein: Occlusive thrombus extends throughout the femoral vein. Popliteal Vein: The popliteal vein is only partially compressible. There is some evidence of color flow on color Doppler imaging. Findings are most consistent with nonocclusive thrombus. Calf Veins: No evidence of color flow in the posterior tibial or peroneal veins. Superficial Great Saphenous Vein: No evidence of thrombus. Normal compressibility. Venous Reflux:  None. Other Findings: Patient body habitus allowed ultrasound evaluation of the left external iliac vein. Thrombus appears to extend into the iliac vein. Transabdominal imaging of the IVC demonstrates that the proximal IVC is patent at the level of the liver. IMPRESSION: 1. Positive for extensive  occlusive deep venous thrombosis throughout the left lower extremity extending from the external iliac vein into the calf veins. The thrombus within the popliteal vein is only partially occlusive. 2. Additionally, the patient has nearly occlusive DVT in the right common femoral vein which was imaged for comparison. The remainder of the right lower extremity was not assessed on this unilateral examination. Electronically  Signed   By: Jacqulynn Cadet M.D.   On: 08/06/2019 17:04      Impression / Plan:   Wendy Krueger is a 86 y.o. y/o female with history of advanced dementia presented to the hospital last month with a DVT.  Was not started on anticoagulation as there was a history of rectal bleeding.  No sigmoidoscopy or colonoscopy was performed as it was felt based on external rectal exam that it was secondary to hemorrhoids which were seen externally.  In addition with conservative management the bleeding stopped and hence endoscopy was deferred due to the risks associated with the same.  She comes back in the hospital with recurrence of her DVT, has an IVC filter in place, I have been consulted for the history of brown stools with some blood noted in the past and a drop in hemoglobin.  Impression: History is suggestive of a lower GI bleed.  Evaluation would be challenging due to her dementia and inability to prep.  Discussed that the easiest option would be to perform a sigmoidoscopy particularly examining the rectum and anal canal to evaluate for hemorrhoids and rule out a rectal mass.  Unlikely that she would be able to tolerate a sigmoidoscopy and hences less likely that we would be able to reach the hepatic flexure and rule out a mass that was seen previously on the CT scan.  Plan 1.  Sigmoidoscopy tomorrow after a few enemas to see if there is any lesion.  If none seen it is probably unlikely that we will be able to perform a colonoscopy as I do not see her being able to drink  bowel prep.  If  the sigmoidoscopy is negative could consider repeat CT scan of the abdomen to check for the abnormal area in the hepatic flexure to determine if it is still present or absent  2.  Would think that the bigger picture would be to discuss goals of care and determine with the family how much they would like to be done considering multiple comorbidities.   I have discussed alternative options, risks & benefits,  which include, but are not limited to, bleeding, infection, perforation,respiratory complication & drug reaction.  The patient agrees with this plan & written consent will be obtained.     Thank you for involving me in the care of this patient.      LOS: 1 day   Jonathon Bellows, MD  08/07/2019, 10:58 AM

## 2019-08-07 NOTE — Consult Note (Signed)
SURGICAL CONSULTATION NOTE   HISTORY OF PRESENT ILLNESS (HPI):  86 y.o. female presented to [facility] ED for evaluation of right leg pain.  The patient has significant dementia and most of the history was taken from her daughter.  Patient was receiving physical therapy and was complaining of right leg pain.  On a recent admission patient was diagnosed with right leg DVT.  The daughter reported that more swollen.  Patient was brought to the ED for evaluation of the leg.  In the ED he was mentioned that she continued having rectal bleeding.  The bleeding described as bright red blood.  Patient does endorses pain on the perianal area.  The pain does not radiate to other part of the body.  The daughter reports that the patient has used rectal cream and suppositories with intermittent improvement.   At the ED hemoglobin was 8.5.  2 weeks ago the hemoglobin was 9.0.  This morning the hemoglobin was 7. Transfusion with packed RBC were ordered by admitting physician.  Patient had a CT scan on the previous admission that shows constipation, diverticulosis and a density of 2.6 cm at the hepatic flexure.  I personally evaluated the CT scan done on 07/05/2019  Surgery is consulted by Dr. Leslye Peer in this context for evaluation and management of internal hemorrhoid with bleeding.  PAST MEDICAL HISTORY (PMH):  Past Medical History:  Diagnosis Date  . Asymptomatic gallstones   . BRBPR (bright red blood per rectum)   . Cataract 04/13/2018   bilateral eyes  . Cholelithiasis   . Clot    RLE blood clots; s/p IVC filter  . Colitis - presumed infectious origin    one ER visit   . Dementia ([place])   . Duodenitis   . Fall   . Gastritis   . GI bleed   . Hemorrhoid    bleeding  . Hiatal hernia   . Hyperlipidemia   . Hypertension   . Osteoarthritis   . Osteoporosis   . Stroke ([place])   . Syncope      PAST SURGICAL HISTORY [facility]):  Past Surgical History:  Procedure Laterality Date  . ABDOMINAL HYSTERECTOMY     BSO- fibroids  . ABI's     normal  . dexa  1/05   osteoporosis  . ESOPHAGOGASTRODUODENOSCOPY N/A 03/21/2014   Procedure: ESOPHAGOGASTRODUODENOSCOPY (EGD);  Surgeon: Milus Banister, MD;  Location: [place];  Service: Endoscopy;  Laterality: N/A;  . IR IVC FILTER PLMT / S&I /IMG GUID/MOD SED  07/06/2019  . IVC FILTER INSERTION  07/2019  . left foot brace    . WRIST FRACTURE SURGERY  2/01   R arm  . WRIST FRACTURE SURGERY Left 02/12/2018     MEDICATIONS:  Prior to Admission medications   Medication Sig Start Date End Date Taking? Authorizing Provider  Chlorpheniramine Maleate (ALLERGY PO) Take 1 tablet by mouth daily.    Yes [provider]  Cholecalciferol (VITAMIN D-3) 1000 units CAPS Take 1,000-2,000 Units by mouth See admin instructions. Take 1000 units in the morning and 2000 in the evening   Yes [provider]  donepezil (ARICEPT) 10 MG tablet Take 1 tablet (10 mg total) by mouth at bedtime. 08/02/19  Yes Melvenia Beam, MD  hydrochlorothiazide (HYDRODIURIL) 25 MG tablet Take 0.5 tablets (12.5 mg total) by mouth daily. 07/12/19  Yes Mikhail, Velta Addison, DO  hydrocortisone (ANUSOL-HC) 2.5 % rectal cream Place 1 application rectally 2 (two) times daily as needed for hemorrhoids or anal itching.  07/12/19  Yes Mikhail, Velta Addison, DO  losartan (COZAAR) 100 MG tablet Take 0.5 tablets (50 mg total) by mouth daily. 07/12/19  Yes Mikhail, Velta Addison, DO  Melatonin 10 MG TABS Take 10 mg by mouth at bedtime.   Yes [provider]  memantine (NAMENDA) 10 MG tablet Take 1 tablet (10 mg total) by mouth 2 (two) times daily. 08/02/19  Yes Melvenia Beam, MD  metoprolol tartrate (LOPRESSOR) 25 MG tablet Take 1 tablet (25 mg total) by mouth 2 (two) times daily. 04/19/19  Yes Tower, Wynelle Fanny, MD  Multiple Vitamins-Minerals ([employer] SPECTRAVITE PO) Take 1 capsule by mouth daily.   Yes [provider]  potassium chloride (KLOR-CON M10) 10 MEQ tablet Take 2 tablets (20 mEq total) by  mouth daily. Patient taking differently: Take 20 mEq by mouth every evening.  04/19/19  Yes Tower, Wynelle Fanny, MD  Probiotic Product (PROBIOTIC PO) Take 1 tablet by mouth every other day.    Yes [provider]  vitamin B-12 (CYANOCOBALAMIN) 1000 MCG tablet Take 1,000 mcg by mouth daily.   Yes [provider]     ALLERGIES:  Allergies  Allergen Reactions  . Alendronate Sodium Other (See Comments)    Leg pain   . Amlodipine Besylate Hives  . Calcitonin ([place]) Other (See Comments)     headache/ head pressure  . Plavix [Clopidogrel Bisulfate] Other (See Comments)    drowsy    . Simvastatin Other (See Comments)    Leg pain      SOCIAL HISTORY:  Social History   Socioeconomic History  . Marital status: Widowed    Spouse name: Not on file  . Number of children: 5  . Years of education: 12  . Highest education level: Not on file  Occupational History  . Occupation: retired    Fish farm manager: RETIRED  Tobacco Use  . Smoking status: Never Smoker  . Smokeless tobacco: Never Used  Substance and Sexual Activity  . Alcohol use: No    Alcohol/week: 0.0 standard drinks  . Drug use: No  . Sexual activity: Never  Other Topics Concern  . Not on file  Social History Narrative   Has 4 brothers, 1 sister   Lives with 1 adult grandchild and her daughter [name]   Has 4 daughters, 1 son   Right handed   Rare caffeine    Social Determinants of Health   Financial Resource Strain: Low Risk   . Difficulty of Paying Living Expenses: Not hard at all  Food Insecurity: No Food Insecurity  . Worried About Charity fundraiser in the Last Year: Never true  . Ran Out of Food in the Last Year: Never true  Transportation Needs: No Transportation Needs  . Lack of Transportation (Medical): No  . Lack of Transportation (Non-Medical): No  Physical Activity: Inactive  . Days of Exercise per Week: 0 days  . Minutes of Exercise per Session: 0 min  Stress: No Stress Concern Present  .  Feeling of Stress : Not at all  Social Connections: Moderately Isolated  . Frequency of Communication with Friends and Family: More than three times a week  . Frequency of Social Gatherings with Friends and Family: More than three times a week  . Attends Religious Services: Never  . Active Member of Clubs or Organizations: No  . Attends Archivist Meetings: Never  . Marital Status: Widowed  Intimate Partner Violence: Not At Risk  . Fear of Current or Ex-Partner: No  .  Emotionally Abused: No  . Physically Abused: No  . Sexually Abused: No    FAMILY HISTORY:  Family History  Problem Relation Age of Onset  . Heart attack Father   . Hypertension Father   . Heart attack Mother   . Throat cancer Brother   . Breast cancer Daughter      REVIEW OF SYSTEMS: Due to dementia review of system is unreliable but patient does respond to these questions. Constitutional: denies weight loss, fever, chills, or sweats  Eyes: denies any other vision changes, history of eye injury  ENT: denies sore throat, hearing problems  Respiratory: denies shortness of breath, wheezing  Cardiovascular: denies chest pain, palpitations  Gastrointestinal: denies abdominal pain, N/V, as per daughter, positive for constipation and diarrhea.  Positive for rectal bleeding. Genitourinary: denies burning with urination or urinary frequency Musculoskeletal: positive for joint pains or cramps  Skin: denies any other rashes or skin discolorations  Neurological: denies any other headache, dizziness, weakness  Psychiatric: denies any other depression, anxiety   All other review of systems were negative   VITAL SIGNS:  Temp:  [97.6 F (36.4 C)-98.4 F (36.9 C)] 97.7 F (36.5 C) (02/06 1340) Pulse Rate:  [62-103] 71 (02/06 1340) Resp:  [13-21] 17 (02/06 1340) BP: (90-132)/(56-96) 107/63 (02/06 1340) SpO2:  [87 %-100 %] 100 % (02/06 1340) Weight:  [69.7 kg-70.3 kg] 69.7 kg (02/05 2332)     Height: 5' (152.4 cm)  Weight: 69.7 kg BMI (Calculated): 30.01   INTAKE/OUTPUT:  This shift: Total I/O In: 0  Out: 350 [Urine:350]  Last 2 shifts: @IOLAST2SHIFTS @   PHYSICAL EXAM:  Constitutional:  -- Normal body habitus  -- Awake, no distress  Eyes:  -- Pupils equally round and reactive to light  -- No scleral icterus  Ear, nose, and throat:  -- No jugular venous distension  Pulmonary:  -- No crackles  -- Equal breath sounds bilaterally -- Breathing non-labored at rest Cardiovascular:  -- S1, S2 present  -- No pericardial rubs Gastrointestinal:  -- Abdomen soft, nontender, non-distended, no guarding or rebound tenderness -- No abdominal masses appreciated, pulsatile or otherwise  -- Rectal: Large internal and external hemorrhoids.  No bleeding.  Tender to palpation.  No thrombosis, no fissures Musculoskeletal and Integumentary:  -- Wounds or skin discoloration: None appreciated -- Extremities: Right leg with increased swelling compared to the left side, warm Neurologic:  -- Motor function: intact and symmetric -- Sensation: intact and symmetric   Labs:  CBC Latest Ref Rng & Units 08/07/2019 08/06/2019 07/12/2019  WBC 4.0 - 10.5 K/uL 4.3 5.3 -  Hemoglobin 12.0 - 15.0 g/dL 7.0(L) 8.5(L) 9.0(L)  Hematocrit 36.0 - 46.0 % 22.5(L) 27.8(L) 28.4(L)  Platelets 150 - 400 K/uL 151 163 -   CMP Latest Ref Rng & Units 08/07/2019 08/06/2019 07/10/2019  Glucose 70 - 99 mg/dL 107(H) 87 93  BUN 8 - 23 mg/dL 16 19 19   Creatinine 0.44 - 1.00 mg/dL 1.16(H) 1.24(H) 1.22(H)  Sodium 135 - 145 mmol/L 139 139 139  Potassium 3.5 - 5.1 mmol/L 3.3(L) 4.5 4.0  Chloride 98 - 111 mmol/L 106 104 110  CO2 22 - 32 mmol/L 26 22 21(L)  Calcium 8.9 - 10.3 mg/dL 9.3 9.9 8.7(L)  Total Protein 6.5 - 8.1 g/dL - - -  Total Bilirubin 0.3 - 1.2 mg/dL - - -  Alkaline Phos 38 - 126 U/L - - -  AST 15 - 41 U/L - - -  ALT 0 - 44 U/L - - -  Imaging studies:  EXAM: CT ABDOMEN AND PELVIS WITH CONTRAST  TECHNIQUE: Multidetector CT  imaging of the abdomen and pelvis was performed using the standard protocol following bolus administration of intravenous contrast.  CONTRAST:  38mL OMNIPAQUE IOHEXOL 300 MG/ML  SOLN  COMPARISON:  Nov 07, 2011  FINDINGS: Lower chest: There is presumed atelectasis at the left lung base.The heart size is borderline enlarged.  Hepatobiliary: Again noted is a hemangioma in the left hepatic lobe. Cholelithiasis without acute inflammation.There is no biliary ductal dilation.  Pancreas: Normal contours without ductal dilatation. No peripancreatic fluid collection.  Spleen: No splenic laceration or hematoma.  Adrenals/Urinary Tract:  --Adrenal glands: No adrenal hemorrhage.  --Right kidney/ureter: There is an exophytic cyst arising from the right kidney measuring approximately 4.8 cm. There is no right-sided hydronephrosis or radiopaque obstructing kidney stone  --Left kidney/ureter: .  --Urinary bladder: Unremarkable.  Stomach/Bowel:  --Stomach/Duodenum: There is a large hiatal hernia.  --Small bowel: No dilatation or inflammation.  --Colon: There is a large amount of stool in the rectum. There is severe sigmoid diverticulosis without CT evidence for diverticulitis. There is a rounded approximately 2.6 cm density in the colon at the level of the a hepatic flexure (axial series 2, image 35).  --Appendix: Not visualized. No right lower quadrant inflammation or free fluid.  Vascular/Lymphatic: Atherosclerotic calcification is present within the non-aneurysmal abdominal aorta, without hemodynamically significant stenosis. There is a questionable filling defect within the right superficial femoral vein extending into the right common femoral vein.  --No retroperitoneal lymphadenopathy.  --No mesenteric lymphadenopathy.  --No pelvic or inguinal lymphadenopathy.  Reproductive: Status post hysterectomy. No adnexal mass.  Other: No ascites or free  air. The abdominal wall is normal.  Musculoskeletal. There are degenerative changes throughout the thoracolumbar spine without evidence for an acute displaced fracture.  IMPRESSION: 1. No definite acute abnormality detected. 2. There is a questionable filling defect within the right superficial femoral vein extending into the right common femoral vein. Recommend correlation with lower extremity venous Doppler. 3. There is a rounded approximately 2.6 cm density in the colon at the level of the a hepatic flexure. While this could represent stool, a mass is not excluded. Recommend correlation with colonoscopy. 4. There is a large amount of stool in the rectum. Severe sigmoid diverticulosis without CT evidence for diverticulitis. 5. Cholelithiasis without acute inflammation. 6. Large hiatal hernia.  Aortic Atherosclerosis (ICD10-I70.0).   Electronically Signed   By: Constance Holster M.D.   On: 07/05/2019 20:32  Assessment/Plan:  86 y.o. female with rectal bleeding, complicated by pertinent comorbidities including advanced dementia, recently diagnosed DVT, anemia, hyperlipidemia, hypertension, history of stroke. Most of the history taken from daughter.  Physical exam consistent with large internal and external hemorrhoids.  External hemorrhoids are not thrombosed.  Internal hemorrhoids grade 2, moderately inflammation.  No active bleeding during my physical exam.  Patient with multiple possible causes of significant rectal bleeding including: Diverticulosis, suspected hepatic flexure mass and hemorrhoids.  Hemorrhoids rarely cause a severe bleeding that needs transfusion.  I agree with GI that sigmoidoscopy will give [place] more information.  If patient is bleeding approximately, the reason for the bleeding comes from higher in the colon.  If there is no blood seen chronic anemia from hemorrhoid bleeding can be considered.  I agree with GI that the sigmoidoscopy might not be diagnostic due  to poor preparation but trial is worth it.  I will follow those results and continue discussion with the family about further recommendations  from surgical standpoint.  All of the above findings and recommendations were discussed with the patient's daughter and her questions were answered to her expressed satisfaction.  Arnold Long, MD

## 2019-08-07 NOTE — Progress Notes (Signed)
Patient ID: Wendy Krueger, female   DOB: [birth year], 86 y.o.   MRN: [id number] [facility] PROGRESS NOTE  Wendy Krueger [id number] DOB: 09-03-1929 DOA: 08/06/2019 PCP: Abner Greenspan, MD  HPI/Subjective: Patient has dementia and is hard of hearing.  Patient does not offer any complaints.  As per the daughter, she was complaining of pain in her left leg and was found to have a blood clot.  She is having bleeding from the rectum.  At times they see blood shooting from a hemorrhoid.  Objective: Vitals:   08/07/19 0448 08/07/19 0946  BP: 100/71 (!) 90/56  Pulse: 66 62  Resp: 16   Temp: 97.6 F (36.4 C)   SpO2: 98%     Intake/Output Summary (Last 24 hours) at 08/07/2019 1239 Last data filed at 08/07/2019 1232 Gross per 24 hour  Intake 511.71 ml  Output 350 ml  Net 161.71 ml   Filed Weights   08/06/19 1556 08/06/19 2332  Weight: 70.3 kg 69.7 kg    ROS: Review of Systems  Unable to perform ROS: Dementia  Respiratory: Negative for cough and shortness of breath.   Cardiovascular: Negative for chest pain.  Gastrointestinal: Negative for abdominal pain.   Exam: Physical Exam  HENT:  Nose: No mucosal edema.  Mouth/Throat: No oropharyngeal exudate or posterior oropharyngeal edema.  Eyes: Pupils are equal, round, and reactive to light. Conjunctivae, EOM and lids are normal.  Neck: No JVD present. Carotid bruit is not present. No thyroid mass and no thyromegaly present.  Cardiovascular: S1 normal and S2 normal. Exam reveals no gallop.  No murmur heard. Respiratory: No respiratory distress. She has decreased breath sounds in the right lower field and the left lower field. She has no wheezes. She has no rhonchi. She has no rales.  GI: Soft. Bowel sounds are normal. There is no abdominal tenderness.  Musculoskeletal:     Cervical back: No edema.     Right ankle: Swelling present.     Left ankle: Swelling present.  Lymphadenopathy:    She has no cervical adenopathy.   Neurological: She is alert.  Skin: Skin is warm. No rash noted. Nails show no clubbing.  Psychiatric: She has a normal mood and affect.  Answers a few questions.      Data Reviewed: Basic Metabolic Panel: Recent Labs  Lab 08/06/19 2025 08/07/19 0634  NA 139 139  K 4.5 3.3*  CL 104 106  CO2 22 26  GLUCOSE 87 107*  BUN 19 16  CREATININE 1.24* 1.16*  CALCIUM 9.9 9.3    CBC: Recent Labs  Lab 08/06/19 2025 08/07/19 0634  WBC 5.3 4.3  NEUTROABS 3.0  --   HGB 8.5* 7.0*  HCT 27.8* 22.5*  MCV 100.7* 100.0  PLT 163 151     Recent Results (from the past 240 hour(s))  Respiratory Panel by RT PCR (Flu A&B, Covid) - Nasopharyngeal Swab     Status: None   Collection Time: 08/06/19 10:45 PM   Specimen: Nasopharyngeal Swab  Result Value Ref Range Status   SARS Coronavirus 2 by RT PCR NEGATIVE NEGATIVE Final    Comment: (NOTE) SARS-CoV-2 target nucleic acids are NOT DETECTED. The SARS-CoV-2 RNA is generally detectable in upper respiratoy specimens during the acute phase of infection. The lowest concentration of SARS-CoV-2 viral copies this assay can detect is 131 copies/mL. A negative result does not preclude SARS-Cov-2 infection and should not be used as the sole basis for treatment or other patient management decisions.  A negative result may occur with  improper specimen collection/handling, submission of specimen other than nasopharyngeal swab, presence of viral mutation(s) within the areas targeted by this assay, and inadequate number of viral copies (<131 copies/mL). A negative result must be combined with clinical observations, patient history, and epidemiological information. The expected result is Negative. Fact Sheet for Patients:  PinkCheek.be Fact Sheet for Healthcare Providers:  GravelBags.it This test is not yet ap proved or cleared by the [place] FDA and  has been authorized for detection  and/or diagnosis of SARS-CoV-2 by FDA under an Emergency Use Authorization (EUA). This EUA will remain  in effect (meaning this test can be used) for the duration of the COVID-19 declaration under Section 564(b)(1) of the Act, 21 U.S.C. section 360bbb-3(b)(1), unless the authorization is terminated or revoked sooner.    Influenza A by PCR NEGATIVE NEGATIVE Final   Influenza B by PCR NEGATIVE NEGATIVE Final    Comment: (NOTE) The Xpert Xpress SARS-CoV-2/FLU/RSV assay is intended as an aid in  the diagnosis of influenza from Nasopharyngeal swab specimens and  should not be used as a sole basis for treatment. Nasal washings and  aspirates are unacceptable for Xpert Xpress SARS-CoV-2/FLU/RSV  testing. Fact Sheet for Patients: PinkCheek.be Fact Sheet for Healthcare Providers: GravelBags.it This test is not yet approved or cleared by the [place] FDA and  has been authorized for detection and/or diagnosis of SARS-CoV-2 by  FDA under an Emergency Use Authorization (EUA). This EUA will remain  in effect (meaning this test can be used) for the duration of the  Covid-19 declaration under Section 564(b)(1) of the Act, 21  U.S.C. section 360bbb-3(b)(1), unless the authorization is  terminated or revoked. Performed at [facility], [address], [place], [place] [address]      Studies: US Venous Img Lower Unilateral Left  Result Date: 08/06/2019 CLINICAL DATA:  86 year old female with left lower extremity pain and swelling. She had an IVC filter placed on 07/06/2019 EXAM: LEFT LOWER EXTREMITY VENOUS DOPPLER ULTRASOUND TECHNIQUE: Gray-scale sonography with graded compression, as well as color Doppler and duplex ultrasound were performed to evaluate the lower extremity deep venous systems from the level of the common femoral vein and including the common femoral, femoral, profunda femoral, popliteal and calf veins including  the posterior tibial, peroneal and gastrocnemius veins when visible. The superficial great saphenous vein was also interrogated. Spectral Doppler was utilized to evaluate flow at rest and with distal augmentation maneuvers in the common femoral, femoral and popliteal veins. COMPARISON:  None. FINDINGS: Contralateral Common Femoral Vein: The right common femoral vein is not compressible. The lumen is filled with echogenic material and there is evidence of trace color flow on color Doppler imaging. Common Femoral Vein: The left common femoral vein is not compressible. The lumen is filled with heterogeneously echogenic material. No evidence of flow on color Doppler imaging. Findings are consistent with acute occlusive thrombus. Saphenofemoral Junction: Occlusive thrombus extends into the saphenofemoral junction. Profunda Femoral Vein: Occlusive thrombus extends into the profunda femoral vein. Femoral Vein: Occlusive thrombus extends throughout the femoral vein. Popliteal Vein: The popliteal vein is only partially compressible. There is some evidence of color flow on color Doppler imaging. Findings are most consistent with nonocclusive thrombus. Calf Veins: No evidence of color flow in the posterior tibial or peroneal veins. Superficial Great Saphenous Vein: No evidence of thrombus. Normal compressibility. Venous Reflux:  None. Other Findings: Patient body habitus allowed ultrasound evaluation of the left external iliac vein.  Thrombus appears to extend into the iliac vein. Transabdominal imaging of the IVC demonstrates that the proximal IVC is patent at the level of the liver. IMPRESSION: 1. Positive for extensive occlusive deep venous thrombosis throughout the left lower extremity extending from the external iliac vein into the calf veins. The thrombus within the popliteal vein is only partially occlusive. 2. Additionally, the patient has nearly occlusive DVT in the right common femoral vein which was imaged for  comparison. The remainder of the right lower extremity was not assessed on this unilateral examination. Electronically Signed   By: Jacqulynn Cadet M.D.   On: 08/06/2019 17:04    Scheduled Meds: . sodium chloride   Intravenous Once  . sodium chloride   Intravenous Once  . acetaminophen  650 mg Oral Once  . donepezil  10 mg Oral QHS  . Melatonin  10 mg Oral QHS  . memantine  10 mg Oral BID   Continuous Infusions:  Assessment/Plan:  1. Acute blood loss anemia with lower GI bleed.  Could be secondary to hemorrhoids or other colon pathology.  Case discussed with gastroenterology and general surgery to see the patient.  Since hemoglobin dropped down to 7.0 I will discontinue IV fluids and give a blood transfusion.  Benefits and risks of transfusion explained to daughter at the bedside.  Because of her dementia may be difficult to get a colonoscopy prep and colonoscopy. 2. Acute lower extremity DVT.  Patient has an IVC filter.  Anticoagulation contraindication at this time with acute blood loss anemia. 3. Relative hypotension.  Hold antihypertensive medications 4. Dementia on Namenda and Aricept. 5. Hypokalemia replace potassium 6. Chronic kidney disease stage IIIb  Code Status:     Code Status Orders  (From admission, onward)         Start     Ordered   08/06/19 2338  Do not attempt resuscitation (DNR)  Continuous    Question Answer Comment  In the event of cardiac or respiratory ARREST Do not call a "code blue"   In the event of cardiac or respiratory ARREST Do not perform Intubation, CPR, defibrillation or ACLS   In the event of cardiac or respiratory ARREST Use medication by any route, position, wound care, and other measures to relive pain and suffering. May use oxygen, suction and manual treatment of airway obstruction as needed for comfort.      08/06/19 2337        Code Status History    Date Active Date Inactive Code Status Order ID Comments User Context   07/05/2019  2336 07/12/2019 1957 DNR [id number]  Etta Quill, DO ED   02/09/2018 0858 02/12/2018 1802 DNR [id number]  Radene Gunning, NP Inpatient   02/09/2018 0645 02/09/2018 0858 Full Code [id number]  Radene Gunning, NP ED   05/07/2017 0355 05/09/2017 2133 Full Code [id number]  Rise Patience, MD Inpatient   03/19/2014 2345 03/23/2014 1538 Full Code [id number]  Etta Quill, DO ED   Advance Care Planning Activity    Advance Directive Documentation     Most Recent Value  Type of Advance Directive  Living will  Pre-existing out of facility DNR order (yellow form or pink MOST form)  --  "MOST" Form in Place?  --     Family Communication: Spoke with daughter at the bedside Disposition Plan: Patient will need to stop bleeding and hemoglobin needs to be stable prior to disposition.  Consultants:  Gastroenterology  General surgery  Time spent: 31  minutes  Mckynleigh Mussell [employer]

## 2019-08-08 ENCOUNTER — Encounter
Admission: EM | Disposition: A | Discharge status: Home / Self Care | Payer: Self-pay | Source: Home / Self Care | Attending: Internal Medicine

## 2019-08-08 ENCOUNTER — Encounter: Payer: Self-pay | Admitting: Obstetrics and Gynecology

## 2019-08-08 ENCOUNTER — Inpatient Hospital Stay: Payer: Medicare Other | Admitting: Anesthesiology

## 2019-08-08 DIAGNOSIS — K625 Hemorrhage of anus and rectum: Secondary | ICD-10-CM

## 2019-08-08 DIAGNOSIS — I82402 Acute embolism and thrombosis of unspecified deep veins of left lower extremity: Secondary | ICD-10-CM

## 2019-08-08 DIAGNOSIS — K645 Perianal venous thrombosis: Secondary | ICD-10-CM

## 2019-08-08 HISTORY — PX: FLEXIBLE SIGMOIDOSCOPY: SHX5431

## 2019-08-08 LAB — C DIFFICILE QUICK SCREEN W PCR REFLEX
C Diff antigen: NEGATIVE
C Diff interpretation: NOT DETECTED
C Diff toxin: NEGATIVE

## 2019-08-08 LAB — CBC
HCT: 29.7 % — ABNORMAL LOW (ref 36.0–46.0)
Hemoglobin: 9.5 g/dL — ABNORMAL LOW (ref 12.0–15.0)
MCH: 31.1 pg (ref 26.0–34.0)
MCHC: 32 g/dL (ref 30.0–36.0)
MCV: 97.4 fL (ref 80.0–100.0)
Platelets: 149 10*3/uL — ABNORMAL LOW (ref 150–400)
RBC: 3.05 MIL/uL — ABNORMAL LOW (ref 3.87–5.11)
RDW: 14.3 % (ref 11.5–15.5)
WBC: 3.5 10*3/uL — ABNORMAL LOW (ref 4.0–10.5)
nRBC: 0 % (ref 0.0–0.2)

## 2019-08-08 LAB — VITAMIN B12: Vitamin B-12: 3102 pg/mL — ABNORMAL HIGH (ref 180–914)

## 2019-08-08 SURGERY — SIGMOIDOSCOPY, FLEXIBLE
Anesthesia: General

## 2019-08-08 MED ORDER — FLEET ENEMA 7-19 GM/118ML RE ENEM: 1.0000 | ENEMA | Freq: Once | RECTAL | Status: DC

## 2019-08-08 MED ORDER — SODIUM CHLORIDE 0.9 % IV SOLN
500.0000 mg | Freq: Once | INTRAVENOUS | Status: AC
  Administered 2019-08-08: 500 mg via INTRAVENOUS
  Filled 2019-08-08: qty 25

## 2019-08-08 MED ORDER — FENTANYL CITRATE (PF) 100 MCG/2ML IJ SOLN
INTRAMUSCULAR | Status: AC
  Filled 2019-08-08: qty 2

## 2019-08-08 MED ORDER — PHENYLEPHRINE HCL (PRESSORS) 10 MG/ML IV SOLN
INTRAVENOUS | Status: DC | PRN
  Administered 2019-08-08 (×2): 100 ug via INTRAVENOUS

## 2019-08-08 MED ORDER — PROPOFOL 500 MG/50ML IV EMUL
INTRAVENOUS | Status: DC | PRN
  Administered 2019-08-08: 10 mg via INTRAVENOUS

## 2019-08-08 MED ORDER — FENTANYL CITRATE (PF) 100 MCG/2ML IJ SOLN
INTRAMUSCULAR | Status: DC | PRN
  Administered 2019-08-08: 25 ug via INTRAVENOUS

## 2019-08-08 MED ORDER — GLYCERIN (LAXATIVE) 2.1 G RE SUPP
1.0000 | Freq: Once | RECTAL | Status: AC
  Administered 2019-08-08: 1 via RECTAL
  Filled 2019-08-08: qty 1

## 2019-08-08 MED ORDER — HYDROCORTISONE (PERIANAL) 2.5 % EX CREA
TOPICAL_CREAM | Freq: Two times a day (BID) | CUTANEOUS | Status: DC
  Filled 2019-08-08 (×2): qty 28.35

## 2019-08-08 MED ORDER — ALPRAZOLAM 0.25 MG PO TABS
0.2500 mg | ORAL_TABLET | Freq: Once | ORAL | Status: AC
  Administered 2019-08-08: 0.25 mg via ORAL
  Filled 2019-08-08: qty 1

## 2019-08-08 MED ORDER — SODIUM CHLORIDE 0.9 % IV SOLN
INTRAVENOUS | Status: DC
  Administered 2019-08-09: 1000 mL via INTRAVENOUS

## 2019-08-08 MED ORDER — PROPOFOL 500 MG/50ML IV EMUL: INTRAVENOUS | Status: DC | PRN

## 2019-08-08 NOTE — H&P (Signed)
Wendy Bellows, MD [address], [place], [place], [place], [address] [address], [place], [place], [place], [address] Phone: [phone]  Fax: [phone]  Primary Care Physician:  Tower, Wynelle Fanny, MD   Pre-Procedure History & Physical: HPI:  Wendy Krueger is a 86 y.o. female is here for a sigmoidoscopy    Past Medical History:  Diagnosis Date  . Asymptomatic gallstones   . BRBPR (bright red blood per rectum)   . Cataract 04/13/2018   bilateral eyes  . Cholelithiasis   . Clot    RLE blood clots; s/p IVC filter  . Colitis - presumed infectious origin    one ER visit   . Dementia ([place])   . Duodenitis   . Fall   . Gastritis   . GI bleed   . Hemorrhoid    bleeding  . Hiatal hernia   . Hyperlipidemia   . Hypertension   . Osteoarthritis   . Osteoporosis   . Stroke ([place])   . Syncope     Past Surgical History:  Procedure Laterality Date  . ABDOMINAL HYSTERECTOMY     BSO- fibroids  . ABI's     normal  . dexa  1/05   osteoporosis  . ESOPHAGOGASTRODUODENOSCOPY N/A 03/21/2014   Procedure: ESOPHAGOGASTRODUODENOSCOPY (EGD);  Surgeon: Milus Banister, MD;  Location: [facility];  Service: Endoscopy;  Laterality: N/A;  . IR IVC FILTER PLMT / S&I /IMG GUID/MOD SED  07/06/2019  . IVC FILTER INSERTION  07/2019  . left foot brace    . WRIST FRACTURE SURGERY  2/01   R arm  . WRIST FRACTURE SURGERY Left 02/12/2018    Prior to Admission medications   Medication Sig Start Date End Date Taking? Authorizing Provider  Chlorpheniramine Maleate (ALLERGY PO) Take 1 tablet by mouth daily.    Yes [provider]  Cholecalciferol (VITAMIN D-3) 1000 units CAPS Take 1,000-2,000 Units by mouth See admin instructions. Take 1000 units in the morning and 2000 in the evening   Yes [provider]  donepezil (ARICEPT) 10 MG tablet Take 1 tablet (10 mg total) by mouth at bedtime. 08/02/19  Yes Melvenia Beam, MD  hydrochlorothiazide (HYDRODIURIL) 25 MG tablet Take  0.5 tablets (12.5 mg total) by mouth daily. 07/12/19  Yes Mikhail, Velta Addison, DO  hydrocortisone (ANUSOL-HC) 2.5 % rectal cream Place 1 application rectally 2 (two) times daily as needed for hemorrhoids or anal itching. 07/12/19  Yes Mikhail, Velta Addison, DO  losartan (COZAAR) 100 MG tablet Take 0.5 tablets (50 mg total) by mouth daily. 07/12/19  Yes Mikhail, Velta Addison, DO  Melatonin 10 MG TABS Take 10 mg by mouth at bedtime.   Yes [provider]  memantine (NAMENDA) 10 MG tablet Take 1 tablet (10 mg total) by mouth 2 (two) times daily. 08/02/19  Yes Melvenia Beam, MD  metoprolol tartrate (LOPRESSOR) 25 MG tablet Take 1 tablet (25 mg total) by mouth 2 (two) times daily. 04/19/19  Yes Tower, Wynelle Fanny, MD  Multiple Vitamins-Minerals ([employer] SPECTRAVITE PO) Take 1 capsule by mouth daily.   Yes [provider]  potassium chloride (KLOR-CON M10) 10 MEQ tablet Take 2 tablets (20 mEq total) by mouth daily. Patient taking differently: Take 20 mEq by mouth every evening.  04/19/19  Yes Tower, Wynelle Fanny, MD  Probiotic Product (PROBIOTIC PO) Take 1 tablet by mouth every other day.    Yes [provider]  vitamin B-12 (CYANOCOBALAMIN) 1000 MCG tablet Take 1,000 mcg by  mouth daily.   Yes [provider]    Allergies as of 08/06/2019 - Review Complete 08/06/2019  Allergen Reaction Noted  . Alendronate sodium Other (See Comments) 03/05/2011  . Amlodipine besylate Hives 03/16/2007  . Calcitonin (salmon) Other (See Comments) 07/23/2010  . Plavix [clopidogrel bisulfate] Other (See Comments) 01/28/2019  . Simvastatin Other (See Comments) 03/05/2011    Family History  Problem Relation Age of Onset  . Heart attack Father   . Hypertension Father   . Heart attack Mother   . Throat cancer Brother   . Breast cancer Daughter     Social History   Socioeconomic History  . Marital status: Widowed    Spouse name: Not on file  . Number of children: 5  . Years of education: 74  . Highest  education level: Not on file  Occupational History  . Occupation: retired    Fish farm manager: RETIRED  Tobacco Use  . Smoking status: Never Smoker  . Smokeless tobacco: Never Used  Substance and Sexual Activity  . Alcohol use: No    Alcohol/week: 0.0 standard drinks  . Drug use: No  . Sexual activity: Never  Other Topics Concern  . Not on file  Social History Narrative   Has 4 brothers, 1 sister   Lives with 1 adult grandchild and her daughter [name]   Has 4 daughters, 1 son   Right handed   Rare caffeine    Social Determinants of Health   Financial Resource Strain: Low Risk   . Difficulty of Paying Living Expenses: Not hard at all  Food Insecurity: No Food Insecurity  . Worried About Charity fundraiser in the Last Year: Never true  . Ran Out of Food in the Last Year: Never true  Transportation Needs: No Transportation Needs  . Lack of Transportation (Medical): No  . Lack of Transportation (Non-Medical): No  Physical Activity: Inactive  . Days of Exercise per Week: 0 days  . Minutes of Exercise per Session: 0 min  Stress: No Stress Concern Present  . Feeling of Stress : Not at all  Social Connections: Moderately Isolated  . Frequency of Communication with Friends and Family: More than three times a week  . Frequency of Social Gatherings with Friends and Family: More than three times a week  . Attends Religious Services: Never  . Active Member of Clubs or Organizations: No  . Attends Archivist Meetings: Never  . Marital Status: Widowed  Intimate Partner Violence: Not At Risk  . Fear of Current or Ex-Partner: No  . Emotionally Abused: No  . Physically Abused: No  . Sexually Abused: No    Review of Systems: Unable to provide   Physical Exam: BP (!) 101/56 (BP Location: Left Arm)   Pulse 88   Temp (!) 97.5 F (36.4 C) (Axillary)   Resp 20   Ht 5' (1.524 m)   Wt 69.7 kg   SpO2 100%   BMI 30.01 kg/m  General:   Alert,  pleasant and cooperative in  NAD Head:  Normocephalic and atraumatic. Neck:  Supple; no masses or thyromegaly. Lungs:  Clear throughout to auscultation, normal respiratory effort.    Heart:  +S1, +S2, Regular rate and rhythm, No edema. Abdomen:  Soft, nontender and nondistended. Normal bowel sounds, without guarding, and without rebound.   Neurologic:  Alert and  oriented x1  Impression/Plan: Wendy Krueger is here for a sigmoidoscopy to evaluate for rectal bleeding Risks, benefits, limitations, and alternatives regarding  colonoscopy have been reviewed with the patients daughter.  Questions have been answered.  All parties agreeable.   Wendy Bellows, MD  08/08/2019, 10:46 AM

## 2019-08-08 NOTE — Anesthesia Preprocedure Evaluation (Addendum)
Anesthesia Evaluation  Patient identified by MRN, date of birth, ID band Patient awake    Reviewed: Allergy & Precautions, H&P , NPO status , Patient's Chart, lab work & pertinent test results  History of Anesthesia Complications Negative for: history of anesthetic complications  Airway Mallampati: II  TM Distance: >3 FB     Dental  (+) Edentulous Lower, Edentulous Upper   Pulmonary neg pulmonary ROS, neg COPD,           Cardiovascular hypertension, (-) Past MI      Neuro/Psych PSYCHIATRIC DISORDERS Anxiety Dementia CVA, No Residual Symptoms    GI/Hepatic Neg liver ROS, hiatal hernia,   Endo/Other  negative endocrine ROS  Renal/GU CRFRenal disease  negative genitourinary   Musculoskeletal   Abdominal   Peds  Hematology  (+) Blood dyscrasia, anemia ,   Anesthesia Other Findings Past Medical History: No date: Asymptomatic gallstones No date: BRBPR (bright red blood per rectum) 04/13/2018: Cataract     Comment:  bilateral eyes No date: Cholelithiasis No date: Clot     Comment:  RLE blood clots; s/p IVC filter No date: Colitis - presumed infectious origin     Comment:  one ER visit  No date: Dementia ([place]) No date: Duodenitis No date: Fall No date: Gastritis No date: GI bleed No date: Hemorrhoid     Comment:  bleeding No date: Hiatal hernia No date: Hyperlipidemia No date: Hypertension No date: Osteoarthritis No date: Osteoporosis No date: Stroke [facility]) No date: Syncope  Past Surgical History: No date: ABDOMINAL HYSTERECTOMY     Comment:  BSO- fibroids No date: ABI's     Comment:  normal 1/05: dexa     Comment:  osteoporosis 03/21/2014: ESOPHAGOGASTRODUODENOSCOPY; N/A     Comment:  Procedure: ESOPHAGOGASTRODUODENOSCOPY (EGD);  Surgeon:               Milus Banister, MD;  Location: [facility];  Service:               Endoscopy;  Laterality: N/A; 07/06/2019: IR IVC FILTER PLMT / S&I Burke Keels GUID/MOD  SED 07/2019: IVC FILTER INSERTION No date: left foot brace 2/01: WRIST FRACTURE SURGERY     Comment:  R arm 02/12/2018: WRIST FRACTURE SURGERY; Left  BMI    Body Mass Index: 30.01 kg/m      Reproductive/Obstetrics negative OB ROS                            Anesthesia Physical Anesthesia Plan  ASA: III  Anesthesia Plan: General   Post-op Pain Management:    Induction:   PONV Risk Score and Plan: TIVA  Airway Management Planned: Natural Airway and Nasal Cannula  Additional Equipment:   Intra-op Plan:   Post-operative Plan:   Informed Consent: I have reviewed the patients History and Physical, chart, labs and discussed the procedure including the risks, benefits and alternatives for the proposed anesthesia with the patient or authorized representative who has indicated his/her understanding and acceptance.     Dental Advisory Given  Plan Discussed with: Anesthesiologist  Anesthesia Plan Comments:        Anesthesia Quick Evaluation

## 2019-08-08 NOTE — Progress Notes (Signed)
PT Cancellation Note  Consult received and chart reviewed. Patient currently off unit for diagnostic procedure. Will re-attempt at later time/date as medically appropriate and available.  Elliana Bal H. Owens Shark, PT, DPT, NCS 08/08/19, 10:56 AM [phone]

## 2019-08-08 NOTE — Progress Notes (Signed)
No stool output after Fleet enema was given.

## 2019-08-08 NOTE — Anesthesia Postprocedure Evaluation (Signed)
Anesthesia Post Note  Patient: Wendy Krueger  Procedure(s) Performed: FLEXIBLE SIGMOIDOSCOPY (N/A )  Patient location during evaluation: PACU Anesthesia Type: General Level of consciousness: awake and alert Pain management: pain level controlled Vital Signs Assessment: post-procedure vital signs reviewed and stable Respiratory status: spontaneous breathing, nonlabored ventilation and respiratory function stable Cardiovascular status: blood pressure returned to baseline and stable Postop Assessment: no apparent nausea or vomiting Anesthetic complications: no     Last Vitals:  Vitals:   08/08/19 1118 08/08/19 1149  BP: 107/68 116/70  Pulse: 97 87  Resp:  16  Temp:  37.1 C  SpO2: 100% 99%    Last Pain:  Vitals:   08/08/19 1149  TempSrc: Oral  PainSc:                  Tera Mater

## 2019-08-08 NOTE — Progress Notes (Signed)
Patient ID: Wendy Krueger, female   DOB: 08-Aug-1929, 86 y.o.   MRN: [id number] [facility] PROGRESS NOTE  THRESSA BAYLIFF [id number] DOB: [birth year] DOA: 08/06/2019 PCP: Abner Greenspan, MD  HPI/Subjective: Patient has dementia so history is not reliable.  Stated she had some abdominal pain when I was palpating her abdomen  Objective: Vitals:   08/08/19 0542 08/08/19 1110  BP: (!) 101/56 132/72  Pulse: 88 79  Resp: 20 19  Temp:  97.6 F (36.4 C)  SpO2: 100% 100%    Intake/Output Summary (Last 24 hours) at 08/08/2019 1127 Last data filed at 08/08/2019 0117 Gross per 24 hour  Intake 325 ml  Output 651 ml  Net -326 ml   Filed Weights   08/06/19 1556 08/06/19 2332  Weight: 70.3 kg 69.7 kg    ROS: Review of Systems  Unable to perform ROS: Dementia  Respiratory: Negative for cough and shortness of breath.   Cardiovascular: Negative for chest pain.  Gastrointestinal: Positive for abdominal pain.   Exam: Physical Exam  HENT:  Nose: No mucosal edema.  Mouth/Throat: No oropharyngeal exudate or posterior oropharyngeal edema.  Eyes: Pupils are equal, round, and reactive to light. Conjunctivae, EOM and lids are normal.  Neck: No JVD present. Carotid bruit is not present. No thyroid mass and no thyromegaly present.  Cardiovascular: S1 normal and S2 normal. Exam reveals no gallop.  No murmur heard. Respiratory: No respiratory distress. She has no decreased breath sounds. She has no wheezes. She has no rhonchi. She has no rales.  GI: Soft. Bowel sounds are normal. There is no abdominal tenderness.  Musculoskeletal:     Cervical back: No edema.     Right ankle: No swelling.     Left ankle: Swelling present.  Lymphadenopathy:    She has no cervical adenopathy.  Neurological: She is alert.  Skin: Skin is warm. No rash noted. Nails show no clubbing.  Psychiatric: She has a normal mood and affect.  Answers a few questions.      Data Reviewed: Basic Metabolic  Panel: Recent Labs  Lab 08/06/19 2025 08/07/19 0634  NA 139 139  K 4.5 3.3*  CL 104 106  CO2 22 26  GLUCOSE 87 107*  BUN 19 16  CREATININE 1.24* 1.16*  CALCIUM 9.9 9.3    CBC: Recent Labs  Lab 08/06/19 2025 08/07/19 0634 08/08/19 0716  WBC 5.3 4.3 3.5*  NEUTROABS 3.0  --   --   HGB 8.5* 7.0* 9.5*  HCT 27.8* 22.5* 29.7*  MCV 100.7* 100.0 97.4  PLT 163 151 149*     Recent Results (from the past 240 hour(s))  Respiratory Panel by RT PCR (Flu A&B, Covid) - Nasopharyngeal Swab     Status: None   Collection Time: 08/06/19 10:45 PM   Specimen: Nasopharyngeal Swab  Result Value Ref Range Status   SARS Coronavirus 2 by RT PCR NEGATIVE NEGATIVE Final    Comment: (NOTE) SARS-CoV-2 target nucleic acids are NOT DETECTED. The SARS-CoV-2 RNA is generally detectable in upper respiratoy specimens during the acute phase of infection. The lowest concentration of SARS-CoV-2 viral copies this assay can detect is 131 copies/mL. A negative result does not preclude SARS-Cov-2 infection and should not be used as the sole basis for treatment or other patient management decisions. A negative result may occur with  improper specimen collection/handling, submission of specimen other than nasopharyngeal swab, presence of viral mutation(s) within the areas targeted by this assay, and inadequate number of viral  copies (<131 copies/mL). A negative result must be combined with clinical observations, patient history, and epidemiological information. The expected result is Negative. Fact Sheet for Patients:  PinkCheek.be Fact Sheet for Healthcare Providers:  GravelBags.it This test is not yet ap proved or cleared by the [place] FDA and  has been authorized for detection and/or diagnosis of SARS-CoV-2 by FDA under an Emergency Use Authorization (EUA). This EUA will remain  in effect (meaning this test can be used) for the duration of  the COVID-19 declaration under Section 564(b)(1) of the Act, 21 U.S.C. section 360bbb-3(b)(1), unless the authorization is terminated or revoked sooner.    Influenza A by PCR NEGATIVE NEGATIVE Final   Influenza B by PCR NEGATIVE NEGATIVE Final    Comment: (NOTE) The Xpert Xpress SARS-CoV-2/FLU/RSV assay is intended as an aid in  the diagnosis of influenza from Nasopharyngeal swab specimens and  should not be used as a sole basis for treatment. Nasal washings and  aspirates are unacceptable for Xpert Xpress SARS-CoV-2/FLU/RSV  testing. Fact Sheet for Patients: PinkCheek.be Fact Sheet for Healthcare Providers: GravelBags.it This test is not yet approved or cleared by the [place] FDA and  has been authorized for detection and/or diagnosis of SARS-CoV-2 by  FDA under an Emergency Use Authorization (EUA). This EUA will remain  in effect (meaning this test can be used) for the duration of the  Covid-19 declaration under Section 564(b)(1) of the Act, 21  U.S.C. section 360bbb-3(b)(1), unless the authorization is  terminated or revoked. Performed at [facility], [address], [place], [place] [address]      Studies: US Venous Img Lower Unilateral Left  Result Date: 08/06/2019 CLINICAL DATA:  86 year old female with left lower extremity pain and swelling. She had an IVC filter placed on 07/06/2019 EXAM: LEFT LOWER EXTREMITY VENOUS DOPPLER ULTRASOUND TECHNIQUE: Gray-scale sonography with graded compression, as well as color Doppler and duplex ultrasound were performed to evaluate the lower extremity deep venous systems from the level of the common femoral vein and including the common femoral, femoral, profunda femoral, popliteal and calf veins including the posterior tibial, peroneal and gastrocnemius veins when visible. The superficial great saphenous vein was also interrogated. Spectral Doppler was utilized to  evaluate flow at rest and with distal augmentation maneuvers in the common femoral, femoral and popliteal veins. COMPARISON:  None. FINDINGS: Contralateral Common Femoral Vein: The right common femoral vein is not compressible. The lumen is filled with echogenic material and there is evidence of trace color flow on color Doppler imaging. Common Femoral Vein: The left common femoral vein is not compressible. The lumen is filled with heterogeneously echogenic material. No evidence of flow on color Doppler imaging. Findings are consistent with acute occlusive thrombus. Saphenofemoral Junction: Occlusive thrombus extends into the saphenofemoral junction. Profunda Femoral Vein: Occlusive thrombus extends into the profunda femoral vein. Femoral Vein: Occlusive thrombus extends throughout the femoral vein. Popliteal Vein: The popliteal vein is only partially compressible. There is some evidence of color flow on color Doppler imaging. Findings are most consistent with nonocclusive thrombus. Calf Veins: No evidence of color flow in the posterior tibial or peroneal veins. Superficial Great Saphenous Vein: No evidence of thrombus. Normal compressibility. Venous Reflux:  None. Other Findings: Patient body habitus allowed ultrasound evaluation of the left external iliac vein. Thrombus appears to extend into the iliac vein. Transabdominal imaging of the IVC demonstrates that the proximal IVC is patent at the level of the liver. IMPRESSION: 1. Positive for extensive occlusive deep  venous thrombosis throughout the left lower extremity extending from the external iliac vein into the calf veins. The thrombus within the popliteal vein is only partially occlusive. 2. Additionally, the patient has nearly occlusive DVT in the right common femoral vein which was imaged for comparison. The remainder of the right lower extremity was not assessed on this unilateral examination. Electronically Signed   By: Jacqulynn Cadet M.D.   On:  08/06/2019 17:04    Scheduled Meds: . [MAR Hold] sodium chloride   Intravenous Once  . [MAR Hold] donepezil  10 mg Oral QHS  . Glycerin (Adult)  1 suppository Rectal Once  . [MAR Hold] hydrocortisone  25 mg Rectal BID  . [MAR Hold] Melatonin  10 mg Oral QHS  . [MAR Hold] memantine  10 mg Oral BID   Continuous Infusions: . sodium chloride    . sodium chloride    . [MAR Hold] iron sucrose      Assessment/Plan:  1. Acute blood loss anemia with lower GI bleed.  Could be secondary to hemorrhoids or other colon pathology.  Gastroenterology attempted a flexible sigmoidoscopy but the patient had a lot of stool there and they will reattempt this procedure tomorrow.  Hemoglobin came up to 9.5 after 1 unit of packed red blood cells.  I will give IV iron later on this afternoon.  Put on clear liquid diet today and n.p.o. after midnight again. 2. Acute left lower extremity DVT, already has a right lower extremity DVT.  Patient has an IVC filter.  Anticoagulation contraindication at this time with acute blood loss anemia. 3. Relative hypotension.  Blood pressure better this morning.  Hold antihypertensive medications 4. Dementia on Namenda and Aricept. 5. Hypokalemia replaced 6. Chronic kidney disease stage IIIb  Code Status:     Code Status Orders  (From admission, onward)         Start     Ordered   08/06/19 2338  Do not attempt resuscitation (DNR)  Continuous    Question Answer Comment  In the event of cardiac or respiratory ARREST Do not call a "code blue"   In the event of cardiac or respiratory ARREST Do not perform Intubation, CPR, defibrillation or ACLS   In the event of cardiac or respiratory ARREST Use medication by any route, position, wound care, and other measures to relive pain and suffering. May use oxygen, suction and manual treatment of airway obstruction as needed for comfort.      08/06/19 2337        Code Status History    Date Active Date Inactive Code Status  Order ID Comments User Context   07/05/2019 2336 07/12/2019 1957 DNR [id number]  Etta Quill, DO ED   02/09/2018 0858 02/12/2018 1802 DNR [id number]  Radene Gunning, NP Inpatient   02/09/2018 0645 02/09/2018 0858 Full Code [id number]  Radene Gunning, NP ED   05/07/2017 0355 05/09/2017 2133 Full Code [id number]  Rise Patience, MD Inpatient   03/19/2014 2345 03/23/2014 1538 Full Code [id number]  Etta Quill, DO ED   Advance Care Planning Activity    Advance Directive Documentation     Most Recent Value  Type of Advance Directive  Living will  Pre-existing out of facility DNR order (yellow form or pink MOST form)  --  "MOST" Form in Place?  --     Family Communication: Spoke with daughter at the bedside Disposition Plan: Reattempt at flexible sigmoidoscopy tomorrow.  Recheck hemoglobin tomorrow.  Need to  see how she does with physical therapy and the pain in her leg.  Consultants:  Gastroenterology  General surgery  Time spent: 27 minutes  [place]

## 2019-08-08 NOTE — Progress Notes (Signed)
The patient had a large bowel movement before tap water enema given and C. diff sample was sent it came back negative. Small bowel movement after tap water enema given.

## 2019-08-08 NOTE — Op Note (Signed)
[facility] Gastroenterology Patient Name: Wendy Krueger Procedure Date: 08/08/2019 10:47 AM MRN: [id number] Account #: [id number] Date of Birth: 1929-07-09 Admit Type: Inpatient Age: 86 Room: [facility] ENDO ROOM 4 Gender: Female Note Status: Finalized Procedure:             Flexible Sigmoidoscopy Indications:           Rectal hemorrhage Providers:             Jonathon Bellows MD, MD Medicines:             Monitored Anesthesia Care Complications:         No immediate complications. Procedure:             Pre-Anesthesia Assessment:                        - Prior to the procedure, a History and Physical was                         performed, and patient medications, allergies and                         sensitivities were reviewed. The patient's tolerance                         of previous anesthesia was reviewed.                        - The risks and benefits of the procedure and the                         sedation options and risks were discussed with the                         patient. All questions were answered and informed                         consent was obtained.                        - ASA Grade Assessment: III - A patient with severe                         systemic disease.                        After obtaining informed consent, the scope was passed                         under direct vision. The Colonoscope was introduced                         through the anus and advanced to the the rectum. The                         flexible sigmoidoscopy was accomplished with ease. The                         patient tolerated the procedure well. The quality of  the bowel preparation was poor. Findings:      The digital rectal exam findings found solid hard ball of stool      A large amount of solid stool was found in the rectum, interfering with       visualization. Could not advance the scope beyond a few cm , very hard       solid stool,  extending all the way till the anus . Hemorroids which were       inflammed were seen , unable to retroflex due to large solid stool       filling the vault Impression:            - Preparation of the colon was poor.                        - Abnormal digital rectal exam.                        - Stool in the rectum.                        - No specimens collected.                        - Thrombosed external hemorrhoids found on digital                         rectal exam. Recommendation:        - Return patient to hospital ward for ongoing care.                        - Repeat flexible sigmoidoscopy tomorrow for                         retreatment.                        - suggest 1-2 enemas, suppository to help empty the                         rectal vault. Procedure Code(s):     --- Professional ---                        [phone], 54, Sigmoidoscopy, flexible; diagnostic,                         including collection of specimen(s) by brushing or                         washing, when performed (separate procedure) Diagnosis Code(s):     --- Professional ---                        K64.5, Perianal venous thrombosis                        K62.89, Other specified diseases of anus and rectum                        K62.5, Hemorrhage of anus and rectum CPT copyright 2019 American Medical Association. All rights reserved. The codes documented in this  report are preliminary and upon coder review may  be revised to meet current compliance requirements. Jonathon Bellows, MD Jonathon Bellows MD, MD 08/08/2019 11:08:50 AM This report has been signed electronically. Number of Addenda: 0 Note Initiated On: 08/08/2019 10:47 AM Total Procedure Duration: 0 hours 1 minute 55 seconds  Estimated Blood Loss:  Estimated blood loss: none.      [facility]

## 2019-08-08 NOTE — Progress Notes (Signed)
Tap water enema to be given early in the morning for procedure schedule in AM.

## 2019-08-08 NOTE — Progress Notes (Signed)
[facility] Day(s): 2.   Post op day(s): Day of Surgery.   Interval History: Patient seen and examined, no acute events or new complaints overnight.  There has been no significant obvious bleeding since yesterday.  There has been no change on patient clinical status or physical exam.  Patient received PRBC transfusion yesterday.  Hemoglobin today increased adequately.  Currently stable.  Vital signs in last 24 hours: [min-max] current  Temp:  [97.5 F (36.4 C)-98.4 F (36.9 C)] 97.5 F (36.4 C) (02/06 1952) Pulse Rate:  [62-88] 88 (02/07 0542) Resp:  [16-20] 20 (02/07 0542) BP: (90-121)/(56-99) 101/56 (02/07 0542) SpO2:  [98 %-100 %] 100 % (02/07 0542)     Height: 5' (152.4 cm) Weight: 69.7 kg BMI (Calculated): 30.01   Physical Exam:  Constitutional: alert, cooperative and no distress  Respiratory: breathing non-labored at rest  Cardiovascular: regular rate and sinus rhythm  Gastrointestinal: soft, non-tender, and non-distended Rectal: Internal and external hemorrhoids, no active bleeding, adequate sphincter tone, no palpable masses.  Labs:  CBC Latest Ref Rng & Units 08/08/2019 08/07/2019 08/06/2019  WBC 4.0 - 10.5 K/uL 3.5(L) 4.3 5.3  Hemoglobin 12.0 - 15.0 g/dL 9.5(L) 7.0(L) 8.5(L)  Hematocrit 36.0 - 46.0 % 29.7(L) 22.5(L) 27.8(L)  Platelets 150 - 400 K/uL 149(L) 151 163   CMP Latest Ref Rng & Units 08/07/2019 08/06/2019 07/10/2019  Glucose 70 - 99 mg/dL 107(H) 87 93  BUN 8 - 23 mg/dL 16 19 19   Creatinine 0.44 - 1.00 mg/dL 1.16(H) 1.24(H) 1.22(H)  Sodium 135 - 145 mmol/L 139 139 139  Potassium 3.5 - 5.1 mmol/L 3.3(L) 4.5 4.0  Chloride 98 - 111 mmol/L 106 104 110  CO2 22 - 32 mmol/L 26 22 21(L)  Calcium 8.9 - 10.3 mg/dL 9.3 9.9 8.7(L)  Total Protein 6.5 - 8.1 g/dL - - -  Total Bilirubin 0.3 - 1.2 mg/dL - - -  Alkaline Phos 38 - 126 U/L - - -  AST 15 - 41 U/L - - -  ALT 0 - 44 U/L - - -    Imaging studies: No new pertinent imaging  studies   Assessment/Plan:  86 y.o. female with rectal bleeding, complicated by pertinent comorbidities including advanced dementia, recently diagnosed DVT, anemia, hyperlipidemia, hypertension, history of stroke.  Patient without clinical deterioration.  Today the hemoglobin is 9.5 which increased from 7.0.  This is an adequate increase from PRBC transfusion.  There is a good sign that patient is not actively bleeding.  Patient is planned to have flexible sigmoidoscopy today.  I agree with this procedure to give [place] significant information for further recommendations and management.  The only option that I can offer this patient will be internal hemorrhoid banding if the hemorrhoids appear to be the origin of her bleeding.  It is very important to keep in mind other possibilities of bleeding such as diverticulosis and suspected matting hepatic flexure.  We will continue to follow further recommendations.   Arnold Long, MD

## 2019-08-08 NOTE — Transfer of Care (Signed)
Immediate Anesthesia Transfer of Care Note  Patient: Wendy Krueger  Procedure(s) Performed: FLEXIBLE SIGMOIDOSCOPY (N/A )  Patient Location: PACU  Anesthesia Type:General  Level of Consciousness: sedated  Airway & Oxygen Therapy: Patient Spontanous Breathing and Patient connected to nasal cannula oxygen  Post-op Assessment: Report given to RN and Post -op Vital signs reviewed and stable  Post vital signs: Reviewed and stable  Last Vitals:  Vitals Value Taken Time  BP 132/72 08/08/19 1110  Temp    Pulse 79 08/08/19 1110  Resp 19 08/08/19 1110  SpO2 100 % 08/08/19 1110    Last Pain:  Vitals:   08/07/19 1952  TempSrc: Axillary  PainSc:          Complications: No apparent anesthesia complications

## 2019-08-09 ENCOUNTER — Encounter: Payer: Self-pay | Admitting: *Deleted

## 2019-08-09 ENCOUNTER — Encounter
Admission: EM | Disposition: A | Discharge status: Home / Self Care | Payer: Medicare Other | Source: Home / Self Care | Attending: Internal Medicine

## 2019-08-09 ENCOUNTER — Inpatient Hospital Stay: Payer: Medicare Other | Admitting: Anesthesiology

## 2019-08-09 DIAGNOSIS — I824Y3 Acute embolism and thrombosis of unspecified deep veins of proximal lower extremity, bilateral: Secondary | ICD-10-CM

## 2019-08-09 DIAGNOSIS — K649 Unspecified hemorrhoids: Secondary | ICD-10-CM

## 2019-08-09 DIAGNOSIS — K922 Gastrointestinal hemorrhage, unspecified: Secondary | ICD-10-CM

## 2019-08-09 DIAGNOSIS — K635 Polyp of colon: Secondary | ICD-10-CM

## 2019-08-09 HISTORY — PX: COLONOSCOPY: SHX5424

## 2019-08-09 HISTORY — PX: FLEXIBLE SIGMOIDOSCOPY: SHX5431

## 2019-08-09 LAB — CBC
HCT: 28 % — ABNORMAL LOW (ref 36.0–46.0)
Hemoglobin: 8.9 g/dL — ABNORMAL LOW (ref 12.0–15.0)
MCH: 31 pg (ref 26.0–34.0)
MCHC: 31.8 g/dL (ref 30.0–36.0)
MCV: 97.6 fL (ref 80.0–100.0)
Platelets: 147 10*3/uL — ABNORMAL LOW (ref 150–400)
RBC: 2.87 MIL/uL — ABNORMAL LOW (ref 3.87–5.11)
RDW: 14.1 % (ref 11.5–15.5)
WBC: 4.1 10*3/uL (ref 4.0–10.5)
nRBC: 0 % (ref 0.0–0.2)

## 2019-08-09 LAB — BASIC METABOLIC PANEL
Anion gap: 4 — ABNORMAL LOW (ref 5–15)
BUN: 12 mg/dL (ref 8–23)
CO2: 29 mmol/L (ref 22–32)
Calcium: 8.5 mg/dL — ABNORMAL LOW (ref 8.9–10.3)
Chloride: 106 mmol/L (ref 98–111)
Creatinine, Ser: 1.18 mg/dL — ABNORMAL HIGH (ref 0.44–1.00)
GFR calc Af Amer: 48 mL/min — ABNORMAL LOW (ref >60)
GFR calc non Af Amer: 41 mL/min — ABNORMAL LOW (ref >60)
Glucose, Bld: 89 mg/dL (ref 70–99)
Potassium: 3.7 mmol/L (ref 3.5–5.1)
Sodium: 139 mmol/L (ref 135–145)

## 2019-08-09 SURGERY — SIGMOIDOSCOPY, FLEXIBLE
Anesthesia: General

## 2019-08-09 MED ORDER — POLYETHYLENE GLYCOL 3350 17 G PO PACK: 17.0000 g | PACK | Freq: Every day | ORAL | 0 refills | Status: DC

## 2019-08-09 MED ORDER — SODIUM CHLORIDE 0.9 % IV SOLN
400.0000 mg | Freq: Once | INTRAVENOUS | Status: DC
  Filled 2019-08-09: qty 20

## 2019-08-09 MED ORDER — PROPOFOL 500 MG/50ML IV EMUL
INTRAVENOUS | Status: DC | PRN
  Administered 2019-08-09: 35 ug/kg/min via INTRAVENOUS

## 2019-08-09 MED ORDER — PROPOFOL 10 MG/ML IV BOLUS
INTRAVENOUS | Status: DC | PRN
  Administered 2019-08-09: 20 mg via INTRAVENOUS

## 2019-08-09 MED ORDER — POLYETHYLENE GLYCOL 3350 17 G PO PACK: 17.0000 g | PACK | Freq: Every day | ORAL | Status: DC

## 2019-08-09 MED ORDER — POTASSIUM CHLORIDE CRYS ER 10 MEQ PO TBCR: 20.0000 meq | EXTENDED_RELEASE_TABLET | Freq: Every evening | ORAL | Status: DC

## 2019-08-09 MED ORDER — HYDROCORTISONE ACETATE 25 MG RE SUPP: 25.0000 mg | Freq: Two times a day (BID) | RECTAL | 0 refills | Status: AC

## 2019-08-09 NOTE — Op Note (Signed)
[facility] Gastroenterology Patient Name: Wendy Krueger Procedure Date: 08/09/2019 9:53 AM MRN: [id number] Account #: [id number] Date of Birth: Feb 18, 1930 Admit Type: Outpatient Age: 86 Room: [facility] ENDO ROOM 4 Gender: Female Note Status: Finalized Procedure:             Colonoscopy Indications:           Hematochezia Providers:             Lucilla Lame MD, MD Referring MD:          Wynelle Fanny. Tower (Referring MD) Medicines:             Propofol per Anesthesia Complications:         No immediate complications. Procedure:             Pre-Anesthesia Assessment:                        - Prior to the procedure, a History and Physical was                         performed, and patient medications and allergies were                         reviewed. The patient's tolerance of previous                         anesthesia was also reviewed. The risks and benefits                         of the procedure and the sedation options and risks                         were discussed with the patient. All questions were                         answered, and informed consent was obtained. Prior                         Anticoagulants: The patient has taken no previous                         anticoagulant or antiplatelet agents. ASA Grade                         Assessment: III - A patient with severe systemic                         disease. After reviewing the risks and benefits, the                         patient was deemed in satisfactory condition to                         undergo the procedure.                        After obtaining informed consent, the colonoscope was                         passed  under direct vision. Throughout the procedure,                         the patient's blood pressure, pulse, and oxygen                         saturations were monitored continuously. The Endoscope                         was introduced through the anus and advanced to the                   the cecum, identified by appendiceal orifice and                         ileocecal valve. After obtaining informed consent, the                         colonoscope was passed under direct vision. Throughout                         the procedure, the patient's blood pressure, pulse,                         and oxygen saturations were monitored continuously.The                         colonoscopy was performed without difficulty. The                         patient tolerated the procedure well. The quality of                         the bowel preparation was poor. Findings:      The perianal exam findings include non-thrombosed external hemorrhoids,       non-thrombosed internal hemorrhoids and internal hemorrhoids that       prolapse with straining, but require manual replacement into the anal       canal (Grade III).      A 20 mm polyp was found in the hepatic flexure. The polyp was       semi-pedunculated. Area was successfully injected with 4 mL saline with       indigo carmine for a lift polypectomy. The polyp was removed with a hot       snare. Resection and retrieval were complete. To prevent bleeding       post-intervention, two hemostatic clips were successfully placed (MR       conditional). There was no bleeding at the end of the procedure.      Non-bleeding internal hemorrhoids were found during retroflexion. The       hemorrhoids were Grade III (internal hemorrhoids that prolapse but       require manual reduction). Impression:            - Preparation of the colon was poor.                        - Non-thrombosed external hemorrhoids, non-thrombosed                         internal hemorrhoids  and internal hemorrhoids that                         prolapse with straining, but require manual                         replacement into the anal canal (Grade III) found on                         perianal exam.                        - One 20 mm polyp at the hepatic  flexure, removed with                         a hot snare. Resected and retrieved. Injected. Clips                         (MR conditional) were placed.                        - Non-bleeding internal hemorrhoids. Recommendation:        - Return patient to hospital ward for ongoing care.                        - Resume previous diet.                        - Continue present medications.                        - Await pathology results. Procedure Code(s):     --- Professional ---                        [phone], Colonoscopy, flexible; with removal of                         tumor(s), polyp(s), or other lesion(s) by snare                         technique                        [id number], Colonoscopy, flexible; with directed submucosal                         injection(s), any substance Diagnosis Code(s):     --- Professional ---                        K92.1, Melena (includes Hematochezia)                        K63.5, Polyp of colon CPT copyright 2019 American Medical Association. All rights reserved. The codes documented in this report are preliminary and upon coder review may  be revised to meet current compliance requirements. Lucilla Lame MD, MD 08/09/2019 10:46:16 AM This report has been signed electronically. Number of Addenda: 0 Note Initiated On: 08/09/2019 9:53 AM Scope Withdrawal Time: 0 hours 24 minutes 48 seconds  Total Procedure Duration: 0 hours 30 minutes 41 seconds  Estimated  Blood Loss:  Estimated blood loss: none.      [facility]

## 2019-08-09 NOTE — Anesthesia Preprocedure Evaluation (Signed)
Anesthesia Evaluation  Patient identified by MRN, date of birth, ID band Patient awake    Reviewed: Allergy & Precautions, H&P , NPO status , Patient's Chart, lab work & pertinent test results  History of Anesthesia Complications Negative for: history of anesthetic complications  Airway Mallampati: II  TM Distance: >3 FB     Dental  (+) Edentulous Lower, Edentulous Upper   Pulmonary neg pulmonary ROS, neg COPD,           Cardiovascular Exercise Tolerance: Good hypertension, (-) angina(-) Past MI (-) dysrhythmias (-) Valvular Problems/Murmurs     Neuro/Psych PSYCHIATRIC DISORDERS Anxiety Dementia CVA, No Residual Symptoms    GI/Hepatic Neg liver ROS, hiatal hernia,   Endo/Other  negative endocrine ROS  Renal/GU CRFRenal disease  negative genitourinary   Musculoskeletal   Abdominal   Peds  Hematology  (+) Blood dyscrasia, anemia ,   Anesthesia Other Findings Past Medical History: No date: Asymptomatic gallstones No date: BRBPR (bright red blood per rectum) 04/13/2018: Cataract     Comment:  bilateral eyes No date: Cholelithiasis No date: Clot     Comment:  RLE blood clots; s/p IVC filter No date: Colitis - presumed infectious origin     Comment:  one ER visit  No date: Dementia ([place]) No date: Duodenitis No date: Fall No date: Gastritis No date: GI bleed No date: Hemorrhoid     Comment:  bleeding No date: Hiatal hernia No date: Hyperlipidemia No date: Hypertension No date: Osteoarthritis No date: Osteoporosis No date: Stroke [facility]) No date: Syncope  Past Surgical History: No date: ABDOMINAL HYSTERECTOMY     Comment:  BSO- fibroids No date: ABI's     Comment:  normal 1/05: dexa     Comment:  osteoporosis 03/21/2014: ESOPHAGOGASTRODUODENOSCOPY; N/A     Comment:  Procedure: ESOPHAGOGASTRODUODENOSCOPY (EGD);  Surgeon:               Milus Banister, MD;  Location: [place];  Service:   Endoscopy;  Laterality: N/A; 07/06/2019: IR IVC FILTER PLMT / S&I Burke Keels GUID/MOD SED 07/2019: IVC FILTER INSERTION No date: left foot brace 2/01: WRIST FRACTURE SURGERY     Comment:  R arm 02/12/2018: WRIST FRACTURE SURGERY; Left  BMI    Body Mass Index: 30.01 kg/m      Reproductive/Obstetrics negative OB ROS                             Anesthesia Physical  Anesthesia Plan  ASA: III  Anesthesia Plan: General   Post-op Pain Management:    Induction: Intravenous  PONV Risk Score and Plan: TIVA and Propofol infusion  Airway Management Planned: Natural Airway and Nasal Cannula  Additional Equipment:   Intra-op Plan:   Post-operative Plan:   Informed Consent: I have reviewed the patients History and Physical, chart, labs and discussed the procedure including the risks, benefits and alternatives for the proposed anesthesia with the patient or authorized representative who has indicated his/her understanding and acceptance.     Dental Advisory Given  Plan Discussed with: Anesthesiologist  Anesthesia Plan Comments:         Anesthesia Quick Evaluation

## 2019-08-09 NOTE — Progress Notes (Signed)
Tap water enema given

## 2019-08-09 NOTE — Evaluation (Signed)
Physical Therapy Evaluation Patient Details Name: Wendy Krueger MRN: [id number] DOB: 1930-01-02 Today's Date: 08/09/2019   History of Present Illness  Pt is an 86 y.o. female presenting to hospital 08/06/19 with acute L LE DVT, lower GI bleed, and htn.  S/p blood transfusion.  S/p flexible sigmoidoscopy 2/7 with repeat 2/8.  Pt noted with extensive occlusive DVT throughout L LE extending from external iliac vein into the calf veins; nearly occlusive DVT R common femoral vein.  Of note, pt with recent hospitalization for lower GIB and DVT (s/p IVC filter placement 07/06/19).  PMH includes h/o wrist fx s/p surgery L 2019 and R 2/01, carotid aneurysm, dementia, htn, HLD, hiatal hernia, h/o CVA, closed Colle's fx 2016, possible seizure disorder.  Clinical Impression  Prior to hospital admission, pt was ambulatory with RW (with use of gait belt and assist at all times) and lives with family; has 24/7 assist, HHPT, home health nursing, and caregivers 6 days per week for 5 hours/day.  Pt incontinent of stool upon standing from bed requiring toileting and clean-up during session (NT came to assist).  Currently pt is min to mod assist with bed mobility; CGA with transfers; and CGA ambulating 40 feet with RW (limited distance per pt wanting to go back to bed to rest).  Overall pt steady with walker use (cues to stay closer to walker at times) and pt's daughter reports pt's gait (noted below in general gait details) is baseline for pt; family reports pt's ambulation distance at home fluctuates.  Pt would benefit from skilled PT to address noted impairments and functional limitations (see below for any additional details).  Upon hospital discharge, pt would benefit from continueing HHPT.    Follow Up Recommendations Home health PT;Supervision/Assistance - 24 hour    Equipment Recommendations  Rolling walker with 5" wheels;3in1 (PT)    Recommendations for Other Services       Precautions / Restrictions  Precautions Precautions: Fall Restrictions Weight Bearing Restrictions: No      Mobility  Bed Mobility Overal bed mobility: Needs Assistance Bed Mobility: Supine to Sit;Sit to Supine     Supine to sit: Min assist;Mod assist;HOB elevated Sit to supine: Min assist;Mod assist;HOB elevated   General bed mobility comments: assist for trunk semi-supine to sitting edge of bed; assist for LE's sit to semi-supine; vc's for repositioning in bed  Transfers Overall transfer level: Needs assistance Equipment used: Rolling walker (2 wheeled) Transfers: Sit to/from [facility] Sit to Stand: Min guard Stand pivot transfers: Min guard       General transfer comment: x4 trials standing from bed (increased effort 1st trial standing) and x1 trial standing from [facility]; CGA for safety  Ambulation/Gait Ambulation/Gait assistance: Min guard Gait Distance (Feet): 40 Feet Assistive device: Rolling walker (2 wheeled)   Gait velocity: decreased   General Gait Details: decreased stance time R LE; decreased B LE heelstrike (R decreased compared to L); decreased L LE step length; occasional vc's to stay closer to walker  Stairs            Wheelchair Mobility    Modified Rankin (Stroke Patients Only)       Balance Overall balance assessment: Needs assistance Sitting-balance support: No upper extremity supported;Feet supported Sitting balance-Leahy Scale: Good Sitting balance - Comments: steady sitting reaching within BOS     Standing balance-Leahy Scale: Poor Standing balance comment: pt requiring at least single UE support in standing  Pertinent Vitals/Pain Pain Assessment: Faces Faces Pain Scale: No hurt Pain Intervention(s): Limited activity within patient's tolerance;Monitored during session;Repositioned  HR WFL with activity    Home Living Family/patient expects to be discharged to:: Private residence Living Arrangements:  Children(Pt's [name] and her son) Available Help at Discharge: Family;Available 24 hours/day;Personal care attendant Type of Home: House Home Access: Ramped entrance     Home Layout: One level Home Equipment: [place] - 2 wheels;Cane - single point;Bedside commode;Shower seat;Grab bars - tub/shower;Hand held shower head;Transport chair;Grab bars - toilet Additional Comments: bed rail; bed alarm with baby monitor; lift chair (sleeps in bed)    Prior Function Level of Independence: Needs assistance   Gait / Transfers Assistance Needed: Ambulates with RW household distances (fluctuates with distance) with assist from family (uses gait belt); no falls since June 2020  ADL's / Homemaking Assistance Needed: Assist for bathing and dressing and ADL's  Comments: 24/7 assist; HHPT 2x/week; nurse 1x/week; caregiver 6 days per week for 5 hours/day     Hand Dominance        Extremity/Trunk Assessment   Upper Extremity Assessment Upper Extremity Assessment: Generalized weakness;Difficult to assess due to impaired cognition    Lower Extremity Assessment Lower Extremity Assessment: Generalized weakness;Difficult to assess due to impaired cognition       Communication   Communication: No difficulties  Cognition Arousal/Alertness: Awake/alert Behavior During Therapy: Impulsive Overall Cognitive Status: History of cognitive impairments - at baseline                                 General Comments: Oriented to first name only      General Comments   Nursing cleared pt for participation in physical therapy.  Pt and pt's family (2 daughters Wendy Krueger and Wendy Krueger present during session) agreeable to PT session.    Exercises     Assessment/Plan    PT Assessment Patient needs continued PT services  PT Problem List Decreased strength;Decreased activity tolerance;Decreased balance;Decreased mobility;Decreased knowledge of use of DME;Decreased knowledge of  precautions;Pain       PT Treatment Interventions DME instruction;Gait training;Functional mobility training;Therapeutic activities;Therapeutic exercise;Balance training;Patient/family education    PT Goals (Current goals can be found in the Care Plan section)  Acute Rehab PT Goals Patient Stated Goal: to go home and improve mobility PT Goal Formulation: With family Time For Goal Achievement: 08/23/19 Potential to Achieve Goals: Good    Frequency Min 2X/week   Barriers to discharge        Co-evaluation               AM-PAC PT "6 Clicks" Mobility  Outcome Measure Help needed turning from your back to your side while in a flat bed without using bedrails?: A Little Help needed moving from lying on your back to sitting on the side of a flat bed without using bedrails?: A Lot Help needed moving to and from a bed to a chair (including a wheelchair)?: A Little Help needed standing up from a chair using your arms (e.g., wheelchair or bedside chair)?: A Little Help needed to walk in hospital room?: A Little Help needed climbing 3-5 steps with a railing? : A Lot 6 Click Score: 16    End of Session Equipment Utilized During Treatment: Gait belt Activity Tolerance: Patient tolerated treatment well Patient left: in bed;with call bell/phone within reach;with bed alarm set;with family/visitor present Nurse Communication: Mobility status;Precautions PT Visit Diagnosis:  Other abnormalities of gait and mobility (R26.89);Muscle weakness (generalized) (M62.81);History of falling (Z91.81);Difficulty in walking, not elsewhere classified (R26.2)    Time: [id number] PT Time Calculation (min) (ACUTE ONLY): 44 min   Charges:   PT Evaluation $PT Eval Low Complexity: 1 Low PT Treatments $Therapeutic Activity: 8-22 mins        Leitha Bleak, PT 08/09/19, 4:06 PM

## 2019-08-09 NOTE — Transfer of Care (Signed)
Immediate Anesthesia Transfer of Care Note  Patient: Wendy Krueger  Procedure(s) Performed: FLEXIBLE SIGMOIDOSCOPY (N/A )  Patient Location: PACU  Anesthesia Type:General  Level of Consciousness: awake and alert   Airway & Oxygen Therapy: Patient Spontanous Breathing and Patient connected to nasal cannula oxygen  Post-op Assessment: Report given to RN and Post -op Vital signs reviewed and stable  Post vital signs: Reviewed and stable  Last Vitals:  Vitals Value Taken Time  BP    Temp    Pulse    Resp    SpO2      Last Pain:  Vitals:   08/09/19 0856  TempSrc: Temporal  PainSc:          Complications: No apparent anesthesia complications

## 2019-08-09 NOTE — Plan of Care (Signed)
The patient has been stable. IV removed. The patient has been discharged home with daughters.  Problem: Education: Goal: Knowledge of General Education information will improve Description: Including pain rating scale, medication(s)/side effects and non-pharmacologic comfort measures Outcome: Completed/Met   Problem: Health Behavior/Discharge Planning: Goal: Ability to manage health-related needs will improve Outcome: Completed/Met   Problem: Clinical Measurements: Goal: Ability to maintain clinical measurements within normal limits will improve Outcome: Completed/Met Goal: Will remain free from infection Outcome: Completed/Met Goal: Diagnostic test results will improve Outcome: Completed/Met Goal: Respiratory complications will improve Outcome: Completed/Met Goal: Cardiovascular complication will be avoided Outcome: Completed/Met   Problem: Activity: Goal: Risk for activity intolerance will decrease Outcome: Completed/Met   Problem: Nutrition: Goal: Adequate nutrition will be maintained Outcome: Completed/Met   Problem: Coping: Goal: Level of anxiety will decrease Outcome: Completed/Met   Problem: Elimination: Goal: Will not experience complications related to bowel motility Outcome: Completed/Met Goal: Will not experience complications related to urinary retention Outcome: Completed/Met   Problem: Pain Managment: Goal: General experience of comfort will improve Outcome: Completed/Met   Problem: Safety: Goal: Ability to remain free from injury will improve Outcome: Completed/Met   Problem: Skin Integrity: Goal: Risk for impaired skin integrity will decrease Outcome: Completed/Met   Problem: Acute Rehab PT Goals(only PT should resolve) Goal: Pt Will Go Supine/Side To Sit Outcome: Completed/Met Goal: Patient Will Transfer Sit To/From Stand Outcome: Completed/Met Goal: Pt Will Transfer Bed To Chair/Chair To Bed Outcome: Completed/Met Goal: Pt Will  Ambulate Outcome: Completed/Met Goal: Pt/caregiver will Perform Home Exercise Program Outcome: Completed/Met

## 2019-08-09 NOTE — Care Management Important Message (Signed)
Important Message  Patient Details  Name: Wendy Krueger MRN: [id number] Date of Birth: 01/07/1930   Medicare Important Message Given:  Yes     [name] 08/09/2019, 11:25 AM

## 2019-08-09 NOTE — Discharge Summary (Signed)
[facility] NAME: Wendy Krueger    MR#:  [id number]  DATE OF BIRTH:  [birth year]  DATE OF ADMISSION:  08/06/2019 ADMITTING PHYSICIAN: Gwynne Edinger, MD  DATE OF DISCHARGE: 08/09/2019  PRIMARY CARE PHYSICIAN: Tower, Wynelle Fanny, MD    ADMISSION DIAGNOSIS:  DVT, lower extremity, distal, acute, bilateral (HCC) [I82.4Z3] Acute deep vein thrombosis (DVT) of distal vein of left lower extremity (HCC) [I82.4Z2] Gastrointestinal hemorrhage, unspecified gastrointestinal hemorrhage type [K92.2]  DISCHARGE DIAGNOSIS:  Principal Problem:   DVT of lower extremity, bilateral (HCC) Active Problems:   Essential hypertension   Arterial ischemic stroke, chronic   Dementia (HCC)   Carotid aneurysm, right (HCC)   Hypotension   Acute blood loss anemia   Lower GI bleed   Bleeding hemorrhoids   DVT, lower extremity, distal, acute, bilateral (HCC)   Chronic renal failure, stage 3b   Polyp of colon   Gastrointestinal hemorrhage   SECONDARY DIAGNOSIS:   Past Medical History:  Diagnosis Date  . Asymptomatic gallstones   . BRBPR (bright red blood per rectum)   . Cataract 04/13/2018   bilateral eyes  . Cholelithiasis   . Clot    RLE blood clots; s/p IVC filter  . Colitis - presumed infectious origin    one ER visit   . Dementia ([place])   . Duodenitis   . Fall   . Gastritis   . GI bleed   . Hemorrhoid    bleeding  . Hiatal hernia   . Hyperlipidemia   . Hypertension   . Osteoarthritis   . Osteoporosis   . Stroke ([place])   . Syncope     HOSPITAL COURSE:   1.  Acute blood loss anemia with lower GI bleed.  The patient had a colonoscopy and a large colon polyp was removed with no bleeding seen even though colon was a poor prep.  The patient received 1 unit of packed red blood cells during the hospital course.  I gave IV iron.  Likely her bleeding is coming from large hemorrhoids.  Patient was seen in consultation by Dr. Windell Moment.  Family  did not want to do anything aggressive with the hemorrhoids at this point.  Recommend serial hemoglobins as outpatient.  Can consider iron infusions in the future.  Patient's hemoglobin is 8.9 upon discharge.  MiraLAX to avoid constipation.  Anusol suppositories prescribed by Dr. Windell Moment. 2.  Acute DVT left lower extremity and history of DVT right lower extremity.  Patient already has an IVC filter.  No treatment for DVT at this time secondary to acute blood loss anemia and GI bleed. 3.  Relative hypotension.  Her antihypertensive medications were held.  Continue to monitor off antihypertensive medications. 4.  Dementia on Namenda and Aricept 5.  Hypokalemia this was replaced during the hospital course 6.  Chronic kidney disease stage IIIb  DISCHARGE CONDITIONS:   Satisfactory  CONSULTS OBTAINED:  Treatment Team:  Jonathon Bellows, MD Herbert Pun, MD  DRUG ALLERGIES:   Allergies  Allergen Reactions  . Alendronate Sodium Other (See Comments)    Leg pain   . Amlodipine Besylate Hives  . Calcitonin ([place]) Other (See Comments)     headache/ head pressure  . Plavix [Clopidogrel Bisulfate] Other (See Comments)    drowsy    . Simvastatin Other (See Comments)    Leg pain     DISCHARGE MEDICATIONS:   Allergies as of 08/09/2019      Reactions  Alendronate Sodium Other (See Comments)   Leg pain    Amlodipine Besylate Hives   Calcitonin ([place]) Other (See Comments)    headache/ head pressure   Plavix [clopidogrel Bisulfate] Other (See Comments)   drowsy     Simvastatin Other (See Comments)   Leg pain       Medication List    STOP taking these medications   hydrochlorothiazide 25 MG tablet Commonly known as: HYDRODIURIL   hydrocortisone 2.5 % rectal cream Commonly known as: Anusol-HC   losartan 100 MG tablet Commonly known as: COZAAR   metoprolol tartrate 25 MG tablet Commonly known as: LOPRESSOR   Vitamin D-3 25 MCG (1000 UT) Caps     TAKE these  medications   ALLERGY PO Take 1 tablet by mouth daily.   [employer] SPECTRAVITE PO Take 1 capsule by mouth daily.   donepezil 10 MG tablet Commonly known as: ARICEPT Take 1 tablet (10 mg total) by mouth at bedtime.   hydrocortisone 25 MG suppository Commonly known as: ANUSOL-HC Place 1 suppository (25 mg total) rectally 2 (two) times daily for 10 days.   Melatonin 10 MG Tabs Take 10 mg by mouth at bedtime.   memantine 10 MG tablet Commonly known as: NAMENDA Take 1 tablet (10 mg total) by mouth 2 (two) times daily.   polyethylene glycol 17 g packet Commonly known as: MIRALAX / GLYCOLAX Take 17 g by mouth daily.   potassium chloride 10 MEQ tablet Commonly known as: Klor-Con M10 Take 2 tablets (20 mEq total) by mouth every evening.   PROBIOTIC PO Take 1 tablet by mouth every other day.   vitamin B-12 1000 MCG tablet Commonly known as: CYANOCOBALAMIN Take 1,000 mcg by mouth daily.        DISCHARGE INSTRUCTIONS:   Follow-up PMD 5 days Can follow-up with Dr. Windell Moment if needed.  If you experience worsening of your admission symptoms, develop shortness of breath, life threatening emergency, suicidal or homicidal thoughts you must seek medical attention immediately by calling 911 or calling your MD immediately  if symptoms less severe.  You Must read complete instructions/literature along with all the possible adverse reactions/side effects for all the Medicines you take and that have been prescribed to you. Take any new Medicines after you have completely understood and accept all the possible adverse reactions/side effects.   Please note  You were cared for by a hospitalist during your hospital stay. If you have any questions about your discharge medications or the care you received while you were in the hospital after you are discharged, you can call the unit and asked to speak with the hospitalist on call if the hospitalist that took care of you is not available. Once you  are discharged, your primary care physician will handle any further medical issues. Please note that NO REFILLS for any discharge medications will be authorized once you are discharged, as it is imperative that you return to your primary care physician (or establish a relationship with a primary care physician if you do not have one) for your aftercare needs so that they can reassess your need for medications and monitor your lab values.    Today   CHIEF COMPLAINT:   Chief Complaint  Patient presents with  . Leg Pain    left    HISTORY OF PRESENT ILLNESS:  Wendy Krueger  is a 86 y.o. female came in with left leg pain   VITAL SIGNS:  Blood pressure 119/66, pulse 93, temperature 98.9 F (  37.2 C), temperature source Oral, resp. rate 18, height 5' (1.524 m), weight 69.7 kg, SpO2 99 %.    PHYSICAL EXAMINATION:  GENERAL:  86 y.o.-year-old patient lying in the bed with no acute distress.  HEENT: Head atraumatic, normocephalic. NECK:  Supple  LUNGS: Normal breath sounds bilaterally, no wheezing, rales,rhonchi or crepitation. No use of accessory muscles of respiration.  CARDIOVASCULAR: S1, S2 normal. No murmurs, rubs, or gallops.  ABDOMEN: Soft, non-tender, non-distended. Bowel sounds present. No organomegaly or mass.  EXTREMITIES: Left lower extremity 2+ pedal edema, right lower extremity no edema.  NEUROLOGIC: Patient able to move all of her extremities PSYCHIATRIC: The patient is alert and answers a few yes or no questions.  SKIN: No obvious rash, lesion, or ulcer.   DATA REVIEW:   CBC Recent Labs  Lab 08/09/19 0506  WBC 4.1  HGB 8.9*  HCT 28.0*  PLT 147*    Chemistries  Recent Labs  Lab 08/09/19 0506  NA 139  K 3.7  CL 106  CO2 29  GLUCOSE 89  BUN 12  CREATININE 1.18*  CALCIUM 8.5*    Microbiology Results  Results for orders placed or performed during the hospital encounter of 08/06/19  Respiratory Panel by RT PCR (Flu A&B, Covid) - Nasopharyngeal Swab      Status: None   Collection Time: 08/06/19 10:45 PM   Specimen: Nasopharyngeal Swab  Result Value Ref Range Status   SARS Coronavirus 2 by RT PCR NEGATIVE NEGATIVE Final    Comment: (NOTE) SARS-CoV-2 target nucleic acids are NOT DETECTED. The SARS-CoV-2 RNA is generally detectable in upper respiratoy specimens during the acute phase of infection. The lowest concentration of SARS-CoV-2 viral copies this assay can detect is 131 copies/mL. A negative result does not preclude SARS-Cov-2 infection and should not be used as the sole basis for treatment or other patient management decisions. A negative result may occur with  improper specimen collection/handling, submission of specimen other than nasopharyngeal swab, presence of viral mutation(s) within the areas targeted by this assay, and inadequate number of viral copies (<131 copies/mL). A negative result must be combined with clinical observations, patient history, and epidemiological information. The expected result is Negative. Fact Sheet for Patients:  PinkCheek.be Fact Sheet for Healthcare Providers:  GravelBags.it This test is not yet ap proved or cleared by the [place] FDA and  has been authorized for detection and/or diagnosis of SARS-CoV-2 by FDA under an Emergency Use Authorization (EUA). This EUA will remain  in effect (meaning this test can be used) for the duration of the COVID-19 declaration under Section 564(b)(1) of the Act, 21 U.S.C. section 360bbb-3(b)(1), unless the authorization is terminated or revoked sooner.    Influenza A by PCR NEGATIVE NEGATIVE Final   Influenza B by PCR NEGATIVE NEGATIVE Final    Comment: (NOTE) The Xpert Xpress SARS-CoV-2/FLU/RSV assay is intended as an aid in  the diagnosis of influenza from Nasopharyngeal swab specimens and  should not be used as a sole basis for treatment. Nasal washings and  aspirates are unacceptable for  Xpert Xpress SARS-CoV-2/FLU/RSV  testing. Fact Sheet for Patients: PinkCheek.be Fact Sheet for Healthcare Providers: GravelBags.it This test is not yet approved or cleared by the [place] FDA and  has been authorized for detection and/or diagnosis of SARS-CoV-2 by  FDA under an Emergency Use Authorization (EUA). This EUA will remain  in effect (meaning this test can be used) for the duration of the  Covid-19 declaration under Section 564(b)(1) of the  Act, 21  U.S.C. section 360bbb-3(b)(1), unless the authorization is  terminated or revoked. Performed at [facility], [address] [place], [place] [address]   C difficile quick scan w PCR reflex     Status: None   Collection Time: 08/07/19 10:19 AM   Specimen: STOOL  Result Value Ref Range Status   C Diff antigen NEGATIVE NEGATIVE Final   C Diff toxin NEGATIVE NEGATIVE Final   C Diff interpretation No C. difficile detected.  Final    Comment: Performed at [facility], [address], [place], [place] [address]     Management plans discussed with the patient, family and they are in agreement.  CODE STATUS:     Code Status Orders  (From admission, onward)         Start     Ordered   08/06/19 2338  Do not attempt resuscitation (DNR)  Continuous    Question Answer Comment  In the event of cardiac or respiratory ARREST Do not call a "code blue"   In the event of cardiac or respiratory ARREST Do not perform Intubation, CPR, defibrillation or ACLS   In the event of cardiac or respiratory ARREST Use medication by any route, position, wound care, and other measures to relive pain and suffering. May use oxygen, suction and manual treatment of airway obstruction as needed for comfort.      08/06/19 2337        Code Status History    Date Active Date Inactive Code Status Order ID Comments User Context   07/05/2019 2336 07/12/2019 1957 DNR [id number]   Etta Quill, DO ED   02/09/2018 0858 02/12/2018 1802 DNR [id number]  Radene Gunning, NP Inpatient   02/09/2018 0645 02/09/2018 0858 Full Code [id number]  Radene Gunning, NP ED   05/07/2017 0355 05/09/2017 2133 Full Code [id number]  Rise Patience, MD Inpatient   03/19/2014 2345 03/23/2014 1538 Full Code [id number]  Etta Quill, DO ED   Advance Care Planning Activity    Advance Directive Documentation     Most Recent Value  Type of Advance Directive  Living will  Pre-existing out of facility DNR order (yellow form or pink MOST form)  --  "MOST" Form in Place?  --      TOTAL TIME TAKING CARE OF THIS PATIENT: 35 minutes.    Loletha Grayer M.D on 08/09/2019 at 4:34 PM  Between 7am to 6pm - Pager - [phone]  After 6pm go to [email or web address] - password [email or web address]  [facility]  CC: Primary care physician; Tower, Wynelle Fanny, MD

## 2019-08-09 NOTE — Anesthesia Procedure Notes (Signed)
Date/Time: 08/09/2019 10:04 AM Performed by: Nelda Marseille, CRNA Pre-anesthesia Checklist: Patient identified, Emergency Drugs available, Suction available, Patient being monitored and Timeout performed Oxygen Delivery Method: Nasal cannula

## 2019-08-09 NOTE — Progress Notes (Addendum)
[name] Day(s): 3.   Post op day(s): Day of Surgery.   Interval History: Patient seen and examined, no acute events or new complaints overnight.  Patient's daughter reported that the patient had a colonoscopy today.  She was found with a large polyp.  Denies any new issues.  Vital signs in last 24 hours: [min-max] current  Temp:  [97.1 F (36.2 C)-98.5 F (36.9 C)] 97.5 F (36.4 C) (02/08 1055) Pulse Rate:  [60-86] 60 (02/08 1125) Resp:  [13-20] 13 (02/08 1125) BP: (96-146)/(54-113) 137/71 (02/08 1125) SpO2:  [98 %-100 %] 100 % (02/08 1125)     Height: 5' (152.4 cm) Weight: 69.7 kg BMI (Calculated): 30.01   Physical Exam:  Constitutional: alert, cooperative and no distress  Respiratory: breathing non-labored at rest  Cardiovascular: regular rate and sinus rhythm  Gastrointestinal: soft, non-tender, and non-distended  Labs:  CBC Latest Ref Rng & Units 08/09/2019 08/08/2019 08/07/2019  WBC 4.0 - 10.5 K/uL 4.1 3.5(L) 4.3  Hemoglobin 12.0 - 15.0 g/dL 8.9(L) 9.5(L) 7.0(L)  Hematocrit 36.0 - 46.0 % 28.0(L) 29.7(L) 22.5(L)  Platelets 150 - 400 K/uL 147(L) 149(L) 151   CMP Latest Ref Rng & Units 08/09/2019 08/07/2019 08/06/2019  Glucose 70 - 99 mg/dL 89 107(H) 87  BUN 8 - 23 mg/dL 12 16 19   Creatinine 0.44 - 1.00 mg/dL 1.18(H) 1.16(H) 1.24(H)  Sodium 135 - 145 mmol/L 139 139 139  Potassium 3.5 - 5.1 mmol/L 3.7 3.3(L) 4.5  Chloride 98 - 111 mmol/L 106 106 104  CO2 22 - 32 mmol/L 29 26 22   Calcium 8.9 - 10.3 mg/dL 8.5(L) 9.3 9.9  Total Protein 6.5 - 8.1 g/dL - - -  Total Bilirubin 0.3 - 1.2 mg/dL - - -  Alkaline Phos 38 - 126 U/L - - -  AST 15 - 41 U/L - - -  ALT 0 - 44 U/L - - -    Imaging studies: No new pertinent imaging studies   Assessment/Plan:  86 y.o.femalewith rectal bleeding, complicated by pertinent comorbidities includingadvanced dementia, recently diagnosed DVT, anemia, hyperlipidemia, hypertension, history of stroke.  Patient had  colonoscopy today.  Patient was found with a large polyp.  Polyp was able to be removed completely.  I had a long discussion with the patient's daughter about the treatment of hemorrhoids.  Again the goal is to decrease the bleeding episode from hemorrhoids.  I discussed with the patient about the importance of increasing fiber in her diet.  This will be the best treatment for her constipation and internal hemorrhoid with bleeding.  I also discussed with the daughter about using hydrocortisone suppositories to decrease inflammation of the hemorrhoids.  We talked about the importance of having adequate hydration.  Again all this goal is to improve her constipation to decrease the size of the hemorrhoids and hopefully decreasing the episode of bleeding.  I also discussed with the patient the alternative of doing internal hemorrhoid banding.  Due to the patient mental status and uncomfortable she feels some there this will need to be done under sedation and cannot be done bedside.  Due to the situation that patient's daughter will like to avoid any invasive procedure and agreed to try increasing fiber in the patient's diet and using the suppositories.  My office phone number and information will be given to the patient at discharge.  The patient started understand that if she continued having issues with hemorrhoids she will contact my office for further evaluation.  I  sent the prescription for the hydrocortisone suppository.  I attached high-fiber diet information that can be printed with the discharge AVS.  Arnold Long, MD

## 2019-08-09 NOTE — Progress Notes (Signed)
Cancellation Note:  Pt currently off unit for procedure.  Will re-attempt PT evaluation at a later date/time as medically appropriate.  Leitha Bleak, PT 08/09/19, 8:57 AM

## 2019-08-09 NOTE — Discharge Instructions (Signed)
Attached high fiber diet information to help with constipation.

## 2019-08-10 ENCOUNTER — Telehealth: Payer: Self-pay

## 2019-08-10 ENCOUNTER — Encounter: Payer: Self-pay | Admitting: *Deleted

## 2019-08-10 LAB — BPAM RBC
Blood Product Expiration Date: 202103062359
Blood Product Expiration Date: 202103102359
Blood Product Expiration Date: 202103112359
Blood Product Expiration Date: 202103112359
ISSUE DATE / TIME: 202102061305
ISSUE DATE / TIME: 202102082222
Unit Type and Rh: 5100
Unit Type and Rh: 5100
Unit Type and Rh: 5100
Unit Type and Rh: 5100

## 2019-08-10 LAB — TYPE AND SCREEN
ABO/RH(D): O POS
Antibody Screen: NEGATIVE
Unit division: 0
Unit division: 0
Unit division: 0
Unit division: 0

## 2019-08-10 LAB — PREPARE RBC (CROSSMATCH)

## 2019-08-10 LAB — SURGICAL PATHOLOGY

## 2019-08-10 NOTE — Telephone Encounter (Signed)
Tharon Aquas PT with [facility] left v/m requesting verbal orders for [facility] PT 1 x a wk for 4 wks and also verbal orders for nursing assessment; nurse plans to go out to see pt on 08/11/19. Pt is post hospital DVT and lower GI bleed. Pt has scheduled HFU on 08/13/19 at 12 noon.

## 2019-08-10 NOTE — Telephone Encounter (Signed)
Please ok those verbal orders  

## 2019-08-10 NOTE — Anesthesia Postprocedure Evaluation (Signed)
Anesthesia Post Note  Patient: Wendy Krueger  Procedure(s) Performed: FLEXIBLE SIGMOIDOSCOPY (N/A ) COLONOSCOPY  Patient location during evaluation: Endoscopy Anesthesia Type: General Level of consciousness: awake and alert Pain management: pain level controlled Vital Signs Assessment: post-procedure vital signs reviewed and stable Respiratory status: spontaneous breathing, nonlabored ventilation, respiratory function stable and patient connected to nasal cannula oxygen Cardiovascular status: blood pressure returned to baseline and stable Postop Assessment: no apparent nausea or vomiting Anesthetic complications: no     Last Vitals:  Vitals:   08/09/19 1125 08/09/19 1553  BP: 137/71 119/66  Pulse: 60 93  Resp: 13 18  Temp:  37.2 C  SpO2: 100% 99%    Last Pain:  Vitals:   08/09/19 1553  TempSrc: Oral  PainSc:                  Martha Clan

## 2019-08-10 NOTE — Telephone Encounter (Addendum)
Left VM giving Tharon Aquas the verbal order

## 2019-08-10 NOTE — Telephone Encounter (Signed)
Transition Care Management Follow-up Telephone Call  Date of discharge and from where: 08/09/2019, [facility]  How have you been since you were released from the hospital? Patient doing pretty good since her discharge. Patient is at home resting well.   Any questions or concerns? No   Items Reviewed:  Did the pt receive and understand the discharge instructions provided? Yes   Medications obtained and verified? Yes   Any new allergies since your discharge? No   Dietary orders reviewed? Yes  Do you have support at home? Yes   Functional Questionnaire: (I = Independent and D = Dependent) ADLs: I  Bathing/Dressing- I  Meal Prep- I  Eating- I  Maintaining continence- I  Transferring/Ambulation- I  Managing Meds- I  Follow up appointments reviewed:   PCP Hospital f/u appt confirmed? Yes  Scheduled to see Dr. Glori Bickers on 08/13/2019 @ 12 pm.  [facility] f/u appt confirmed? N/A   Are transportation arrangements needed? No   If their condition worsens, is the pt aware to call PCP or go to the Emergency Dept.? Yes  Was the patient provided with contact information for the PCP's office or ED? Yes  Was to pt encouraged to call back with questions or concerns? Yes

## 2019-08-11 ENCOUNTER — Telehealth: Payer: Self-pay

## 2019-08-11 NOTE — Telephone Encounter (Signed)
Telephone call to patients home to schedule palliative care visit. RN spoke with patients daughter [name], Daughter in agreement with home visit on 08/18/19 at 2:00 PM.

## 2019-08-12 DIAGNOSIS — K922 Gastrointestinal hemorrhage, unspecified: Secondary | ICD-10-CM | POA: Diagnosis not present

## 2019-08-13 ENCOUNTER — Telehealth: Payer: Self-pay

## 2019-08-13 ENCOUNTER — Ambulatory Visit (INDEPENDENT_AMBULATORY_CARE_PROVIDER_SITE_OTHER): Payer: Medicare Other | Admitting: Family Medicine

## 2019-08-13 ENCOUNTER — Encounter: Payer: Self-pay | Admitting: Family Medicine

## 2019-08-13 ENCOUNTER — Other Ambulatory Visit: Payer: Self-pay

## 2019-08-13 VITALS — BP 116/72 | HR 92 | Temp 96.0°F

## 2019-08-13 DIAGNOSIS — R600 Localized edema: Secondary | ICD-10-CM | POA: Diagnosis not present

## 2019-08-13 DIAGNOSIS — K649 Unspecified hemorrhoids: Secondary | ICD-10-CM | POA: Diagnosis not present

## 2019-08-13 DIAGNOSIS — I824Y3 Acute embolism and thrombosis of unspecified deep veins of proximal lower extremity, bilateral: Secondary | ICD-10-CM

## 2019-08-13 DIAGNOSIS — D500 Iron deficiency anemia secondary to blood loss (chronic): Secondary | ICD-10-CM | POA: Diagnosis not present

## 2019-08-13 DIAGNOSIS — I10 Essential (primary) hypertension: Secondary | ICD-10-CM | POA: Diagnosis not present

## 2019-08-13 DIAGNOSIS — K635 Polyp of colon: Secondary | ICD-10-CM | POA: Diagnosis not present

## 2019-08-13 DIAGNOSIS — I9589 Other hypotension: Secondary | ICD-10-CM

## 2019-08-13 DIAGNOSIS — F0390 Unspecified dementia without behavioral disturbance: Secondary | ICD-10-CM

## 2019-08-13 DIAGNOSIS — K922 Gastrointestinal hemorrhage, unspecified: Secondary | ICD-10-CM | POA: Diagnosis not present

## 2019-08-13 DIAGNOSIS — E861 Hypovolemia: Secondary | ICD-10-CM | POA: Diagnosis not present

## 2019-08-13 DIAGNOSIS — F03C0 Unspecified dementia, severe, without behavioral disturbance, psychotic disturbance, mood disturbance, and anxiety: Secondary | ICD-10-CM

## 2019-08-13 LAB — CBC WITH DIFFERENTIAL/PLATELET
Basophils Absolute: 0 10*3/uL (ref 0.0–0.1)
Basophils Relative: 0.9 % (ref 0.0–3.0)
Eosinophils Absolute: 0.2 10*3/uL (ref 0.0–0.7)
Eosinophils Relative: 4 % (ref 0.0–5.0)
HCT: 31.6 % — ABNORMAL LOW (ref 36.0–46.0)
Hemoglobin: 10.2 g/dL — ABNORMAL LOW (ref 12.0–15.0)
Lymphocytes Relative: 29.4 % (ref 12.0–46.0)
Lymphs Abs: 1.3 10*3/uL (ref 0.7–4.0)
MCHC: 32.2 g/dL (ref 30.0–36.0)
MCV: 96.4 fl (ref 78.0–100.0)
Monocytes Absolute: 0.5 10*3/uL (ref 0.1–1.0)
Monocytes Relative: 11.2 % (ref 3.0–12.0)
Neutro Abs: 2.3 10*3/uL (ref 1.4–7.7)
Neutrophils Relative %: 54.5 % (ref 43.0–77.0)
Platelets: 191 10*3/uL (ref 150.0–400.0)
RBC: 3.28 Mil/uL — ABNORMAL LOW (ref 3.87–5.11)
RDW: 14.8 % (ref 11.5–15.5)
WBC: 4.3 10*3/uL (ref 4.0–10.5)

## 2019-08-13 LAB — RENAL FUNCTION PANEL
Albumin: 3.4 g/dL — ABNORMAL LOW (ref 3.5–5.2)
BUN: 11 mg/dL (ref 6–23)
CO2: 27 mEq/L (ref 19–32)
Calcium: 9 mg/dL (ref 8.4–10.5)
Chloride: 106 mEq/L (ref 96–112)
Creatinine, Ser: 1.04 mg/dL (ref 0.40–1.20)
GFR: 50.03 mL/min — ABNORMAL LOW (ref >60.00)
Glucose, Bld: 106 mg/dL — ABNORMAL HIGH (ref 70–99)
Phosphorus: 1.5 mg/dL — ABNORMAL LOW (ref 2.3–4.6)
Potassium: 3.9 mEq/L (ref 3.5–5.1)
Sodium: 140 mEq/L (ref 135–145)

## 2019-08-13 NOTE — Telephone Encounter (Signed)
Wendy Krueger given verbal

## 2019-08-13 NOTE — Assessment & Plan Note (Signed)
Worse on L due to most recent DVT there (has them bilat however) Px written for supp hose to waist if tolerated Off hctz due to hypotension  Renal panel today

## 2019-08-13 NOTE — Assessment & Plan Note (Signed)
In hospital Resolved off her losartan and hctz Will continue holding for now

## 2019-08-13 NOTE — Assessment & Plan Note (Signed)
Primarily external and ? prolapsed Colonoscopy reviewed (one adenoma not bleeding) Continues anusol supp  Also keeping stools soft (disc use of miralax)  No bleeding currently  Will update if this re starts

## 2019-08-13 NOTE — Assessment & Plan Note (Signed)
BP: 116/72  Well controlled w/o losartan and hctz right now (stopped in the hospital for low bp_  Will continue to watch and hold those medicines supp hose px written to help with edema

## 2019-08-13 NOTE — Assessment & Plan Note (Signed)
Due to lower GI bleeding  1 u of prbc and iron inf in hospital Reviewed hospital records, lab results and studies in detail   Re check cbc today  No longer bleeding

## 2019-08-13 NOTE — Telephone Encounter (Signed)
Please ok those verbal orders  

## 2019-08-13 NOTE — Telephone Encounter (Signed)
Aimee, nurse with [facility], l/m asking for verbal orders to continue nursing services 1 times a week though patient's re cert period which is 09/10/19 due to recent hospitalization. Aimee CB is [phone].

## 2019-08-13 NOTE — Assessment & Plan Note (Signed)
Reviewed hospital records, lab results and studies in detail  Bled down to Hb of 7 from hemorrhoids  Rev colonoscopy-one non bleeding adenoma  Will continue to watch hemorrhoids/tx prn  Family will alert if bleeding re occurs

## 2019-08-13 NOTE — Progress Notes (Signed)
Subjective:    Patient ID: Wendy Krueger, female    DOB: 01-09-1930, 86 y.o.   MRN: [id number] This visit occurred during the SARS-CoV-2 public health emergency.  Safety protocols were in place, including screening questions prior to the visit, additional usage of staff PPE, and extensive cleaning of exam room while observing appropriate contact time as indicated for disinfecting solutions.    HPI Pt presents for f/u of hospitalization from 2/5 to 08/09/19 for DVT left, and GI bleeding   Hospital course HOSPITAL COURSE:   1.  Acute blood loss anemia with lower GI bleed.  The patient had a colonoscopy and a large colon polyp was removed with no bleeding seen even though colon was a poor prep.  The patient received 1 unit of packed red blood cells during the hospital course.  I gave IV iron.  Likely her bleeding is coming from large hemorrhoids.  Patient was seen in consultation by Dr. Windell Moment.  Family did not want to do anything aggressive with the hemorrhoids at this point.  Recommend serial hemoglobins as outpatient.  Can consider iron infusions in the future.  Patient's hemoglobin is 8.9 upon discharge.  MiraLAX to avoid  constipation.  Anusol suppositories prescribed by Dr. Windell Moment.   Lab Results  Component Value Date   WBC 4.1 08/09/2019   HGB 8.9 (L) 08/09/2019   HCT 28.0 (L) 08/09/2019   MCV 97.6 08/09/2019   PLT 147 (L) 08/09/2019   No bleeding right now - happy about that  Has not started miralax yet (she does not like to drink)  Increasing fiber  Wants to keep stools soft   Using the suppositories for hemorrhoids (she dislikes)   Polyp turned out to be an adenoma   2.  Acute DVT left lower extremity and history of DVT right lower extremity.  Patient already has an IVC filter.  No treatment for DVT at this time secondary to acute blood loss anemia and GI bleed.  Leg is really swollen   3.  Relative hypotension.  Her antihypertensive medications were held.   Continue to monitor off antihypertensive medications  Today BP: 116/72  Off losartan and hctz  She is more swollen in legs    . 4.  Dementia on Namenda and Aricept 5.  Hypokalemia this was replaced during the hospital course Nl K of 3.7   6.  Chronic kidney disease stage IIIb  Wt Readings from Last 3 Encounters:  08/06/19 153 lb 10.6 oz (69.7 kg)  08/02/19 155 lb (70.3 kg)  07/28/19 160 lb (72.6 kg)    Lab Results  Component Value Date   CREATININE 1.18 (H) 08/09/2019   BUN 12 08/09/2019   NA 139 08/09/2019   K 3.7 08/09/2019   CL 106 08/09/2019   CO2 29 08/09/2019   Cr did improve  Lab Results  Component Value Date   ALT 12 07/05/2019   AST 25 07/05/2019   ALKPHOS 67 07/05/2019   BILITOT 0.5 07/05/2019   glucose 89    covid test negative Flu test neg c diff test neg  [facility] is coming to her house  Also palliative care   Patient Active Problem List   Diagnosis Date Noted  . Pedal edema 08/13/2019  . Polyp of colon   . Gastrointestinal hemorrhage   . Chronic renal failure, stage 3b   . DVT of lower extremity, bilateral ([place]) 08/06/2019  . Bleeding hemorrhoids 08/06/2019  . DVT, lower extremity, distal, acute, bilateral ([place])  08/06/2019  . Advanced dementia ([place]) 08/02/2019  . DVT (deep venous thrombosis) ([place]) 07/13/2019  . Abnormal CT of the abdomen   . External hemorrhoids   . Lower GI bleed 07/06/2019  . Transient alteration of awareness 07/14/2018  . Chronic diarrhea 02/17/2018  . History of fracture of radius 02/17/2018  . Hypokalemia 02/09/2018  . Hypotension 02/09/2018  . History of lower GI bleeding 02/09/2018  . Acute blood loss anemia 02/09/2018  . Acute encephalopathy 05/08/2017  . History of CVA (cerebrovascular accident) 03/10/2016  . Carotid aneurysm, right ([place]) 02/21/2016  . Lipoma of shoulder 03/27/2015  . History of closed Colles' fracture 09/06/2014  . Allergic rhinitis 06/20/2014  . Diastolic dysfunction [id number]  .  Iron deficiency anemia 03/25/2014  . History of GI bleed 03/21/2014  . BRBPR (bright red blood per rectum) 03/19/2014  . Dementia ([place]) 09/01/2013  . Anxiety 09/01/2013  . Encounter for Medicare annual wellness exam 01/08/2013  . Gallstones 11/11/2011  . 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 Risk for falls 06/04/2011  . Constipation 03/05/2011  . Vitamin D deficiency 03/28/2009  . HYPERCHOLESTEROLEMIA, PURE 03/17/2007  . Essential hypertension 03/16/2007  . OSTEOARTHRITIS 03/16/2007  . Osteoporosis 03/16/2007   Past Medical History:  Diagnosis Date  . Asymptomatic gallstones   . BRBPR (bright red blood per rectum)   . Cataract 04/13/2018   bilateral eyes  . Cholelithiasis   . Clot    RLE blood clots; s/p IVC filter  . Colitis - presumed infectious origin    one ER visit   . Dementia ([place])   . Duodenitis   . Fall   . Gastritis   . GI bleed   . Hemorrhoid    bleeding  . Hiatal hernia   . Hyperlipidemia   . Hypertension   . Osteoarthritis   . Osteoporosis   . Stroke ([place])   . Syncope    Past Surgical History:  Procedure Laterality Date  . ABDOMINAL HYSTERECTOMY     BSO- fibroids  . ABI's     normal  . COLONOSCOPY  08/09/2019   Procedure: COLONOSCOPY;  Surgeon: Lucilla Lame, MD;  Location: [facility] ENDOSCOPY;  Service: Endoscopy;;  . dexa  1/05   osteoporosis  . ESOPHAGOGASTRODUODENOSCOPY N/A 03/21/2014   Procedure: ESOPHAGOGASTRODUODENOSCOPY (EGD);  Surgeon: Milus Banister, MD;  Location: [place];  Service: Endoscopy;  Laterality: N/A;  . FLEXIBLE SIGMOIDOSCOPY N/A 08/08/2019   Procedure: FLEXIBLE SIGMOIDOSCOPY;  Surgeon: Jonathon Bellows, MD;  Location: [facility] ENDOSCOPY;  Service: Gastroenterology;  Laterality: N/A;  . FLEXIBLE SIGMOIDOSCOPY N/A 08/09/2019   Procedure: FLEXIBLE SIGMOIDOSCOPY;  Surgeon: Lucilla Lame, MD;  Location: [facility] ENDOSCOPY;  Service: Endoscopy;  Laterality: N/A;  . IR IVC FILTER PLMT / S&I /IMG GUID/MOD SED  07/06/2019  . IVC FILTER  INSERTION  07/2019  . left foot brace    . WRIST FRACTURE SURGERY  2/01   R arm  . WRIST FRACTURE SURGERY Left 02/12/2018   Social History   Tobacco Use  . Smoking status: Never Smoker  . Smokeless tobacco: Never Used  Substance Use Topics  . Alcohol use: No    Alcohol/week: 0.0 standard drinks  . Drug use: No   Family History  Problem Relation Age of Onset  . Heart attack Father   . Hypertension Father   . Heart attack Mother   . Throat cancer Brother   . Breast cancer Daughter    Allergies  Allergen Reactions  . Alendronate Sodium Other (See Comments)  Leg pain   . Amlodipine Besylate Hives  . Calcitonin ([place]) Other (See Comments)     headache/ head pressure  . Plavix [Clopidogrel Bisulfate] Other (See Comments)    drowsy    . Simvastatin Other (See Comments)    Leg pain    Current Outpatient Medications on File Prior to Visit  Medication Sig Dispense Refill  . Chlorpheniramine Maleate (ALLERGY PO) Take 1 tablet by mouth daily.     Marland Kitchen donepezil (ARICEPT) 10 MG tablet Take 1 tablet (10 mg total) by mouth at bedtime. 90 tablet 4  . hydrocortisone (ANUSOL-HC) 25 MG suppository Place 1 suppository (25 mg total) rectally 2 (two) times daily for 10 days. 20 suppository 0  . Melatonin 10 MG TABS Take 10 mg by mouth at bedtime.    . memantine (NAMENDA) 10 MG tablet Take 1 tablet (10 mg total) by mouth 2 (two) times daily. 180 tablet 4  . Multiple Vitamins-Minerals ([employer] SPECTRAVITE PO) Take 1 capsule by mouth daily.    . polyethylene glycol (MIRALAX / GLYCOLAX) 17 g packet Take 17 g by mouth daily. 30 each 0  . potassium chloride (KLOR-CON M10) 10 MEQ tablet Take 2 tablets (20 mEq total) by mouth every evening.    . Probiotic Product (PROBIOTIC PO) Take 1 tablet by mouth every other day.     . vitamin B-12 (CYANOCOBALAMIN) 1000 MCG tablet Take 1,000 mcg by mouth daily.     No current facility-administered medications on file prior to visit.    Review of Systems    Constitutional: Positive for fatigue. Negative for activity change, appetite change, fever and unexpected weight change.  HENT: Negative for congestion, ear pain, rhinorrhea, sinus pressure and sore throat.   Eyes: Negative for pain, redness and visual disturbance.  Respiratory: Negative for cough, shortness of breath and wheezing.   Cardiovascular: Positive for leg swelling. Negative for chest pain and palpitations.  Gastrointestinal: Negative for abdominal pain, blood in stool, constipation and diarrhea.  Endocrine: Negative for polydipsia and polyuria.  Genitourinary: Negative for dysuria, frequency and urgency.  Musculoskeletal: Negative for arthralgias, back pain and myalgias.  Skin: Negative for pallor and rash.  Allergic/Immunologic: Negative for environmental allergies.  Neurological: Negative for dizziness, syncope and headaches.  Hematological: Negative for adenopathy. Does not bruise/bleed easily.  Psychiatric/Behavioral: Positive for confusion. Negative for agitation, decreased concentration and dysphoric mood. The patient is not nervous/anxious.        Dementia        Objective:   Physical Exam Constitutional:      General: She is not in acute distress.    Appearance: Normal appearance. She is normal weight. She is not ill-appearing.     Comments: Frail appearing with dementia in wheelchair   HENT:     Head: Normocephalic and atraumatic.     Mouth/Throat:     Mouth: Mucous membranes are moist.  Eyes:     General: No scleral icterus.    Conjunctiva/sclera: Conjunctivae normal.     Pupils: Pupils are equal, round, and reactive to light.  Cardiovascular:     Rate and Rhythm: Regular rhythm. Tachycardia present.     Heart sounds: Normal heart sounds.     Comments: Rate of 92  Pulmonary:     Effort: Pulmonary effort is normal. No respiratory distress.     Breath sounds: Normal breath sounds. No stridor. No wheezing, rhonchi or rales.  Abdominal:     General: Abdomen  is flat. There is no distension.  Tenderness: There is no abdominal tenderness.  Musculoskeletal:     Cervical back: Normal range of motion and neck supple. No rigidity or tenderness.     Right lower leg: No edema.     Left lower leg: No edema.     Comments: One plus edema in L foot and calf (nontender)    Skin:    General: Skin is warm and dry.     Coloration: Skin is not pale.     Findings: No erythema or rash.  Neurological:     Mental Status: She is alert.     Cranial Nerves: No cranial nerve deficit.     Comments: Generalized weakness  Psychiatric:        Mood and Affect: Mood normal.     Comments: Baseline dementia- smiles and occasionally vocalizes  Nods  Seems content             Assessment & Plan:   Problem List Items Addressed This Visit      Cardiovascular and Mediastinum   Essential hypertension (Chronic)    BP: 116/72  Well controlled w/o losartan and hctz right now (stopped in the hospital for low bp_  Will continue to watch and hold those medicines supp hose px written to help with edema      Relevant Orders   CBC with Differential/Platelet (Completed)   Renal function panel (Completed)   Hypotension    In hospital Resolved off her losartan and hctz Will continue holding for now      DVT of lower extremity, bilateral ([place]) - Primary    Bilateral now  Reviewed hospital records, lab results and studies in detail   Newest in LLE -with some swelling Not a candidate for anticoag due to GI bleeding from hemorrhoids supp hose ordered Continue home care Elevate feet when able       Relevant Orders   For home use only DME Other see comment   Bleeding hemorrhoids    Primarily external and ? prolapsed Colonoscopy reviewed (one adenoma not bleeding) Continues anusol supp  Also keeping stools soft (disc use of miralax)  No bleeding currently  Will update if this re starts        Digestive   Lower GI bleed    Reviewed hospital records, lab  results and studies in detail  Bled down to Hb of 7 from hemorrhoids  Rev colonoscopy-one non bleeding adenoma  Will continue to watch hemorrhoids/tx prn  Family will alert if bleeding re occurs       Polyp of colon    Adenoma Not bleeding  No recall due to age 53 surg path with pt and family today        Nervous and Auditory   Advanced dementia ([place])     Other   Iron deficiency anemia    Due to lower GI bleeding  1 u of prbc and iron inf in hospital Reviewed hospital records, lab results and studies in detail   Re check cbc today  No longer bleeding      Relevant Orders   CBC with Differential/Platelet (Completed)   Pedal edema    Worse on L due to most recent DVT there (has them bilat however) Px written for supp hose to waist if tolerated Off hctz due to hypotension  Renal panel today      Relevant Orders   For home use only DME Other see comment

## 2019-08-13 NOTE — Patient Instructions (Addendum)
miralax with fluids will prevent dehydration    Elevate legs when you can  Here is a px for support hose   Labs today for blood count and kidney function   Please let me know if any new or changed symptoms

## 2019-08-13 NOTE — Assessment & Plan Note (Signed)
Bilateral now  Reviewed hospital records, lab results and studies in detail   Newest in LLE -with some swelling Not a candidate for anticoag due to GI bleeding from hemorrhoids supp hose ordered Continue home care Elevate feet when able

## 2019-08-13 NOTE — Assessment & Plan Note (Signed)
Adenoma Not bleeding  No recall due to age 86 surg path with pt and family today

## 2019-08-16 ENCOUNTER — Encounter: Payer: Self-pay | Admitting: *Deleted

## 2019-08-18 ENCOUNTER — Other Ambulatory Visit: Payer: Medicare Other

## 2019-08-18 ENCOUNTER — Other Ambulatory Visit: Payer: Self-pay

## 2019-08-18 DIAGNOSIS — Z515 Encounter for palliative care: Secondary | ICD-10-CM

## 2019-08-18 NOTE — Progress Notes (Signed)
PATIENT NAME: Wendy Krueger DOB: [birth year] MRN: [id number]  PRIMARY CARE PROVIDER: Abner Greenspan, MD  RESPONSIBLE PARTY:  Acct ID - Guarantor Home Phone Work Phone Relationship Acct Type  [id number] SHRISTI, [phone]  Self P/F     [address], [place], [place] [address]    PLAN OF CARE and INTERVENTIONS:               1.  GOALS OF CARE/ ADVANCE CARE PLANNING:  Remain at home with caregiver and family providing care and staying with patient.                2.  PATIENT/CAREGIVER EDUCATION:  Education on s/s of DVT, education on fall precautions, reviewed meds, support               3.  DISEASE STATUS: SW RN made scheduled palliative care visit. Patient sitting at kitchen table, hired caregiver [name] in home with patient. Patients daughter [name] arrives shortly after palliative care team. Patient had hospitalization 2 weeks ago due to DVT in left lower extremity. Patient states left leg is painful. Left lower extremity remains swollen and warm to touch. Patient had filter placed while at [facility] prior to [facility]  hospitalization however blood clot has gotten larger. Patient is not a candidate to be on blood thinner due to bleeding from hemorrhoids. Patient also had polyp removed while at the hospital and hemorrhoid has shrunk and is no longer bleeding until family inserts suppository at night.  Daughter reports patient may have a small amount of bleeding when suppository is inserted.   Patient ate a banana sandwich and fruit for lunch. Patient is able to ambulate around her kitchen twice with assist of caregiver and her rolling walker. Patient refuses to walk at times stating her leg is painful. Patient is able to feed self. Patient is incontinent of bowel and bladder and wears pull-ups. Patient's breath sounds are clear and patient has no shortness of breath or cough. Patient has not suffered any falls. Patient has been sleeping well at night and naps during the day. Home Health  Services are in place and MD to order PT services for patient. Patient had large bowel movement during visit. Daughter states MD wants family to increase patients fiber.  Patient also  has Miralax in the home. Daughter reports MD discontinued 5 of patient medications prior to discharge from the hospital.  Nurse reviewed patients medication as well as discharge plan for patient. Dr. Glori Bickers wrote for patient to have compression stockings however family does not think they will be able to apply stockings on patient. Nurse made recommendation to see if compression wraps may be a possibility when home health nurse visits. Family remains in agreement with palliative care services. Family encouraged to contact palliative care with questions or concerns.     HISTORY OF PRESENT ILLNESS:  Patient is a 86 year old female who resides in her own home with caregiver and family providing care and support.  Patient is seen monthly and PRN by palliative care team.   CODE STATUS: DNR  ADVANCED DIRECTIVES: Y MOST FORM: Yes PPS: 40%   PHYSICAL EXAM:   VITALS: Today's Vitals   08/18/19 1543  BP: 130/90  Pulse: 80  Resp: 16  Temp: 97.8 F (36.6 C)  TempSrc: Temporal  SpO2: 98%  PainSc: 2   PainLoc: Leg    LUNGS: clear to auscultation  CARDIAC: Cor RRR  EXTREMITIES: 2+ edema SKIN: no visible  open areas of skin breakdown  NEURO: positive for gait problems and memory problems       Nilda Simmer, RN

## 2019-08-18 NOTE — Progress Notes (Signed)
COMMUNITY PALLIATIVE CARE SW NOTE  PATIENT NAME: Wendy Krueger DOB: [birth year] MRN: [id number]  PRIMARY CARE PROVIDER: Abner Greenspan, MD  RESPONSIBLE PARTY:  Acct ID - Guarantor Home Phone Work Phone Relationship Acct Type  [id number] SYA, [id number]  Self P/F     [address], [place], [place] [address]     PLAN OF CARE and INTERVENTIONS:             1. GOALS OF CARE/ ADVANCE CARE PLANNING:  Patient is DNR, form is in the home. MOST form is completed. HCPOA is Wendy Krueger and Wendy Krueger (patient's children). Goal is for patient to be comfortable, and restart PT to help with walking. 2. SOCIAL/EMOTIONAL/SPIRITUAL ASSESSMENT/ INTERVENTIONS:  SW and RN met with patient, Wendy Krueger (patient's daughter) and Wendy Krueger patient's caregiver) in the home. Discussed patient's hospitalization. Wendy Krueger said she was pleased with care at [facility]. RN reviewed discharge paperwork and medications with daughter. Patient was sitting at the table, eating lunch. Patient reports pain in her left leg. RN assessed and discussed continued monitoring of this leg. Patient is doing "pretty good" overall. Patient is sleeping well, and does nap during the day. Patient's appetite is good. [name] noted concerns for patient's itching of her eyes, RN suggested warm cloth with baby oil. Patient's hemorrhoids have improved and are much smaller, and Wendy Krueger said they are now focused on patient's diet and management of bowels at this time. Wendy Krueger tries to encourage patient to get up and walk more in the home and is hopeful PT can help with this once they restart services. Discussed safety. SW provided emotional support, validated Wendy Krueger's concerns and feelings and discussed goals.  3. PATIENT/CAREGIVER EDUCATION/ COPING:  Patient was alert, disoriented. Patient was engaged and tries to answer questions when asked. Patient was pleasant during visit. Wendy Krueger said patient has periods of agitation especially with any personal care. Family is very  supportive. 4. PERSONAL EMERGENCY PLAN:  Family/caregiver will call 9-1-1 for emergencies. 5. COMMUNITY RESOURCES COORDINATION/ HEALTH CARE NAVIGATION:  Wendy Krueger manages patient's care. Patient has a paid caregiver coming for 5 hours per day, with [employer]. When caregivers are not with patient, family is providing care. No upcoming appointments scheduled. Waiting PT to resume at home. 6. FINANCIAL/LEGAL CONCERNS/INTERVENTIONS:  Patient is using LTC insurance benefit to pay for caregivers.     SOCIAL HX:  Social History   Tobacco Use  . Smoking status: Never Smoker  . Smokeless tobacco: Never Used  Substance Use Topics  . Alcohol use: No    Alcohol/week: 0.0 standard drinks    CODE STATUS:   Code Status: Prior (DNR) ADVANCED DIRECTIVES: Y MOST FORM COMPLETE:  Yes. HOSPICE EDUCATION PROVIDED: None.  PPS: Patient is ambulating with walker and gait belt and one person assist. Patient is able to feed herself. Patient needs assist with bathing, dressing and tolieting.   I spent45mnutes with patient/family, from2:00-3:00pproviding education, support and consultation.  WMargaretmary Lombard LCSW

## 2019-08-27 DIAGNOSIS — K648 Other hemorrhoids: Secondary | ICD-10-CM | POA: Diagnosis not present

## 2019-08-27 DIAGNOSIS — K644 Residual hemorrhoidal skin tags: Secondary | ICD-10-CM | POA: Diagnosis not present

## 2019-08-30 ENCOUNTER — Telehealth: Payer: Self-pay

## 2019-08-30 NOTE — Telephone Encounter (Signed)
Pt scheduled for a procedure follow up appt on 09/14/19.

## 2019-08-30 NOTE — Telephone Encounter (Signed)
-----   Message from Lucilla Lame, MD sent at 08/12/2019 11:22 AM EST ----- Please have the patient come in for a follow up.

## 2019-09-07 ENCOUNTER — Telehealth: Payer: Self-pay

## 2019-09-07 NOTE — Telephone Encounter (Signed)
Telephone call to schedule palliative care visit.  RN spoke with patients daughter [name].  [name] in agreement with palliative care team making home visit 09/15/19 at 1:00 PM.

## 2019-09-08 ENCOUNTER — Telehealth: Payer: Self-pay

## 2019-09-08 NOTE — Telephone Encounter (Signed)
Verbal order given and she will also draw labs

## 2019-09-08 NOTE — Telephone Encounter (Signed)
Please ok those verbal orders  If able to do it also for cbc with diff for anemia

## 2019-09-08 NOTE — Telephone Encounter (Signed)
Amy nurse with [facility] left v/m requesting verbal orders to extend [facility] nurse visits 1 x a wk for 9 wks. Pt still has rectal bleeding and has appt with GI next wk. Amy trying to get pt appt with specialist at [place] and [place]. Also Amy wants to know if Dr Glori Bickers wants to give order for CBC with Diff.

## 2019-09-11 DIAGNOSIS — K449 Diaphragmatic hernia without obstruction or gangrene: Secondary | ICD-10-CM | POA: Diagnosis not present

## 2019-09-11 DIAGNOSIS — K5730 Diverticulosis of large intestine without perforation or abscess without bleeding: Secondary | ICD-10-CM | POA: Diagnosis not present

## 2019-09-11 DIAGNOSIS — M810 Age-related osteoporosis without current pathological fracture: Secondary | ICD-10-CM | POA: Diagnosis not present

## 2019-09-11 DIAGNOSIS — K8020 Calculus of gallbladder without cholecystitis without obstruction: Secondary | ICD-10-CM | POA: Diagnosis not present

## 2019-09-11 DIAGNOSIS — I700 Atherosclerosis of aorta: Secondary | ICD-10-CM | POA: Diagnosis not present

## 2019-09-11 DIAGNOSIS — Z8781 Personal history of (healed) traumatic fracture: Secondary | ICD-10-CM | POA: Diagnosis not present

## 2019-09-11 DIAGNOSIS — F419 Anxiety disorder, unspecified: Secondary | ICD-10-CM | POA: Diagnosis not present

## 2019-09-11 DIAGNOSIS — I959 Hypotension, unspecified: Secondary | ICD-10-CM | POA: Diagnosis not present

## 2019-09-11 DIAGNOSIS — M1990 Unspecified osteoarthritis, unspecified site: Secondary | ICD-10-CM | POA: Diagnosis not present

## 2019-09-11 DIAGNOSIS — N1832 Chronic kidney disease, stage 3b: Secondary | ICD-10-CM | POA: Diagnosis not present

## 2019-09-11 DIAGNOSIS — K649 Unspecified hemorrhoids: Secondary | ICD-10-CM | POA: Diagnosis not present

## 2019-09-11 DIAGNOSIS — I82403 Acute embolism and thrombosis of unspecified deep veins of lower extremity, bilateral: Secondary | ICD-10-CM | POA: Diagnosis not present

## 2019-09-11 DIAGNOSIS — Z8673 Personal history of transient ischemic attack (TIA), and cerebral infarction without residual deficits: Secondary | ICD-10-CM | POA: Diagnosis not present

## 2019-09-11 DIAGNOSIS — F0390 Unspecified dementia without behavioral disturbance: Secondary | ICD-10-CM | POA: Diagnosis not present

## 2019-09-11 DIAGNOSIS — J309 Allergic rhinitis, unspecified: Secondary | ICD-10-CM | POA: Diagnosis not present

## 2019-09-11 DIAGNOSIS — I1310 Hypertensive heart and chronic kidney disease without heart failure, with stage 1 through stage 4 chronic kidney disease, or unspecified chronic kidney disease: Secondary | ICD-10-CM | POA: Diagnosis not present

## 2019-09-11 DIAGNOSIS — E559 Vitamin D deficiency, unspecified: Secondary | ICD-10-CM | POA: Diagnosis not present

## 2019-09-11 DIAGNOSIS — Z9181 History of falling: Secondary | ICD-10-CM | POA: Diagnosis not present

## 2019-09-11 DIAGNOSIS — E7800 Pure hypercholesterolemia, unspecified: Secondary | ICD-10-CM | POA: Diagnosis not present

## 2019-09-14 ENCOUNTER — Encounter: Payer: Self-pay | Admitting: Gastroenterology

## 2019-09-14 ENCOUNTER — Ambulatory Visit (INDEPENDENT_AMBULATORY_CARE_PROVIDER_SITE_OTHER): Payer: Medicare Other | Admitting: Gastroenterology

## 2019-09-14 ENCOUNTER — Other Ambulatory Visit: Payer: Self-pay

## 2019-09-14 VITALS — BP 132/82 | HR 118 | Temp 98.2°F | Ht 60.0 in | Wt 152.0 lb

## 2019-09-14 DIAGNOSIS — D122 Benign neoplasm of ascending colon: Secondary | ICD-10-CM

## 2019-09-14 DIAGNOSIS — K922 Gastrointestinal hemorrhage, unspecified: Secondary | ICD-10-CM | POA: Diagnosis not present

## 2019-09-14 NOTE — Progress Notes (Signed)
Primary Care Physician: Tower, Wynelle Fanny, MD  Primary Gastroenterologist:  Dr. Lucilla Lame  Chief Complaint  Patient presents with  . Follow up procedure results    HPI: Wendy Krueger is a 86 y.o. female here for follow-up after having a colonoscopy with rectal bleeding.  The patient continues to have bright red blood per rectum and has been found to have hemorrhoids.  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  The patient is here with her family to follow-up on the results.  Past Medical History:  Diagnosis Date  . Asymptomatic gallstones   . BRBPR (bright red blood per rectum)   . Cataract 04/13/2018   bilateral eyes  . Cholelithiasis   . Clot    RLE blood clots; s/p IVC filter  . Colitis - presumed infectious origin    one ER visit   . Dementia ([place])   . Duodenitis   . Fall   . Gastritis   . GI bleed   . Hemorrhoid    bleeding  . Hiatal hernia   . Hyperlipidemia   . Hypertension   . Osteoarthritis   . Osteoporosis   . Stroke ([place])   . Syncope     Current Outpatient Medications  Medication Sig Dispense Refill  . Chlorpheniramine Maleate (ALLERGY PO) Take 1 tablet by mouth daily.     Marland Kitchen donepezil (ARICEPT) 10 MG tablet Take 1 tablet (10 mg total) by mouth at bedtime. 90 tablet 4  . Melatonin 10 MG TABS Take 10 mg by mouth at bedtime.    . memantine (NAMENDA) 10 MG tablet Take 1 tablet (10 mg total) by mouth 2 (two) times daily. 180 tablet 4  . Multiple Vitamins-Minerals ([employer] SPECTRAVITE PO) Take 1 capsule by mouth daily.    . polyethylene glycol (MIRALAX / GLYCOLAX) 17 g packet Take 17 g by mouth daily. 30 each 0  . potassium chloride (KLOR-CON M10) 10 MEQ tablet Take 2 tablets (20 mEq total) by mouth every evening.    . Probiotic Product (PROBIOTIC PO) Take 1 tablet by mouth every other day.     . vitamin B-12 (CYANOCOBALAMIN) 1000 MCG tablet Take 1,000 mcg by mouth daily.     No current  facility-administered medications for this visit.    Allergies as of 09/14/2019 - Review Complete 09/14/2019  Allergen Reaction Noted  . Alendronate sodium Other (See Comments) 03/05/2011  . Amlodipine besylate Hives 03/16/2007  . Calcitonin (salmon) Other (See Comments) 07/23/2010  . Plavix [clopidogrel bisulfate] Other (See Comments) 01/28/2019  . Simvastatin Other (See Comments) 03/05/2011    ROS:  General: Negative for anorexia, weight loss, fever, chills, fatigue, weakness. ENT: Negative for hoarseness, difficulty swallowing , nasal congestion. CV: Negative for chest pain, angina, palpitations, dyspnea on exertion, peripheral edema.  Respiratory: Negative for dyspnea at rest, dyspnea on exertion, cough, sputum, wheezing.  GI: See history of present illness. GU:  Negative for dysuria, hematuria, urinary incontinence, urinary frequency, nocturnal urination.  Endo: Negative for unusual weight change.    Physical Examination:   BP 132/82   Pulse (!) 118   Temp 98.2 F (36.8 C) (Oral)   Ht 5' (1.524 m)   Wt 152 lb (68.9 kg)   BMI 29.69 kg/m   General: Well-nourished, well-developed in no acute distress.  Eyes: No icterus. Conjunctivae pink. Extremities: No lower extremity edema. No clubbing or deformities. Neuro: Alert and oriented x 3.  Grossly intact. Skin: Warm and dry, no jaundice.   Psych: Alert and cooperative, normal mood and affect.  Labs:    Imaging Studies: No results found.  Assessment and Plan:   Wendy Krueger is a 86 y.o. y/o female who comes in for follow-up after having a large polyp removed from the colon.  The patient was found to have a tubulovillous adenoma but due to her age I do not recommend any further endoscopic procedures.  The patient continues to have bright red blood per rectum likely from the hemorrhoids.  The patient is following up with a colorectal surgeon for possible hemorrhoidal treatment.  The family has been explained the results  and plan and they agree with it.     Lucilla Lame, MD. Marval Regal    Note: This dictation was prepared with Dragon dictation along with smaller phrase technology. Any transcriptional errors that result from this process are unintentional.

## 2019-09-15 ENCOUNTER — Other Ambulatory Visit: Payer: Medicare Other

## 2019-09-15 ENCOUNTER — Other Ambulatory Visit: Payer: Self-pay

## 2019-09-15 DIAGNOSIS — Z515 Encounter for palliative care: Secondary | ICD-10-CM

## 2019-09-15 NOTE — Progress Notes (Signed)
PATIENT NAME: Wendy Krueger DOB: 1930/05/12 MRN: [id number]  PRIMARY CARE PROVIDER: Abner Greenspan, MD  RESPONSIBLE PARTY:  Acct ID - Guarantor Home Phone Work Phone Relationship Acct Type  [id number] DIMA, [phone]  Self P/F     [address], [place], [place] [address]    PLAN OF CARE and INTERVENTIONS:               1.  GOALS OF CARE/ ADVANCE CARE PLANNING:  Remain in her home for as long and family and caregiver and manage patients care.               2.  PATIENT/CAREGIVER EDUCATION:  Education on fall precautions, education on bleeding precautions, education on s/s of infection, support               3.  DISEASE STATUS: SW and RN made scheduled palliative care visit. Palliative care team met with patient, patients daughter [name] and hired caregiver [name]. Patient reclining in recliner chair covered with a throw. Caregiver reports patient just had a large bowel movement. Patient having more episodes of bowel and bladder incontinence. Patient was seen by GI this week per [name] and GI doctor made recommendation that patient should be seen by MD at colorectal center. Daughter [name] has been attempting to schedule patient  Appointment but has been unable to get anyone to return her call.  Daughter reports GI informed them patient had a pre-cancerous polyp that he removed when patient had colonoscopy.  MD also recommended patient not have further colonoscopies in the future. Daughter states they would like to discuss with colorectal Center if there is a procedure that is not invasive to decrease or rid patient of having hemorrhoid issues. Patient remains confused and will repeat herself frequently. Patient states she has a headache. Patient has not had any issues with blood clot or filter that was placed.  Patient has 1 + edema to left lower extremity, no edema to right lower extremity. Patients current weight 152 lbs. Caregiver states patient complains all the time stating she is  sick sick sick or she does not feel good. Patients breath sounds are clear. [name] reports patient is cooperative most of the time but did get agitated at MD's office yesterday. Patient remains able to feed herself and ate a container of Jell-o during visit. Caregiver reports patient states she doesnot want to eat but she waits a few minutes and offer her something later and she will eat. Nurse with [facility] continues to make home visits. PT discharged patient as they felt patient was at her maximum level with PT.  Patient's vital signs are stable. Family reports patient has been sleeping well at night. Patient has no changes in current medications. [name] remains in agreement with palliative care services for patient. Rayburn Ma and caregiver encouraged to contact palliative care with questions or concerns.     HISTORY OF PRESENT ILLNESS: Patientis a 86 year old female who resides in her own home with family and hired caregiver providing care.  Patient is followed by palliative care and is seen monthly and PRN.    CODE STATUS: DNR  ADVANCED DIRECTIVES: Y MOST FORM: Yes PPS: 40%   PHYSICAL EXAM:   VITALS: Today's Vitals   09/15/19 1404  BP: 112/70  Pulse: 95  Resp: 18  Temp: (!) 97.3 F (36.3 C)  TempSrc: Temporal  SpO2: 97%  Weight: 152 lb (68.9 kg)  PainSc: 0-No pain  LUNGS: clear to auscultation  CARDIAC: Cor RRR  EXTREMITIES: 1+ edema in left lower extremity SKIN: No visible open areas of skin breakdown  NEURO: positive for gait problems, memory problems and weakness       Nilda Simmer, RN

## 2019-09-15 NOTE — Progress Notes (Signed)
COMMUNITY PALLIATIVE CARE SW NOTE  PATIENT NAME: Wendy Krueger DOB: Mar 28, 1930 MRN: [id number]  PRIMARY CARE PROVIDER: Abner Greenspan, MD  RESPONSIBLE PARTY:  Acct ID - Guarantor Home Phone Work Phone Relationship Acct Type  [id number] MIHO, [id number]  Self P/F     [address], [place], [place] [address]     PLAN OF CARE and INTERVENTIONS:             1. GOALS OF CARE/ ADVANCE CARE PLANNING:  Patient is DNR, form is in the home. MOST form is completed. HCPOA is [name] and Brenda(patient's children). Goal is for patient to be cared for at home. 2. SOCIAL/EMOTIONAL/SPIRITUAL ASSESSMENT/ INTERVENTIONS:  SW and RN met with patient, [name] (patient's daughter) and [name] (patient's hired caregiver) in the home. Patient appeared comfortable, laid back in chair. Patient denies pain but said she had a headache. [name] and [name] said patient will, at times, note pain with ambulation. Patient has not had issues with blood clots since the filter was placed. Family and caregiver encourage patient to move and walk. Patient has become more difficult to engage in activity per [name]. Patient ate some jell-o during visit and did well with feeding herself. Patient's appetite is diminished, family tries to offer food often and this seems to help. Patient is sleeping well, and naps during the day. SW reviewed care and goals, provided supportive counseling, validated family's care and support.  3. PATIENT/CAREGIVER EDUCATION/ COPING:  Patient was alert, but not engaged unless spoken to. Patient is oriented to self. Patient appeared tired and did doze off during visit. Patient was pleasant with team. [name] said patient has periods of agitation especially with any personal care, especially showers. Discussed engaging patient in activities - coloring, reading, puzzles, flash cards. Family is very supportive of care. 4. PERSONAL EMERGENCY PLAN:  Family/caregiver will call 9-1-1 for  emergencies. 5. COMMUNITY RESOURCES COORDINATION/ HEALTH CARE NAVIGATION:  [name] manages patient's care. Patient has a hired caregiver 5 hours per day, with [employer]. When caregiver is not with patient, family is providing care. [facility] is providing RN weekly visits, PT did not restart services. Patient has an appointment with GI doctor yesterday. [name] is working on a referral to colorectal center to discuss additional options with patient's hemorrhoids. 6. FINANCIAL/LEGAL CONCERNS/INTERVENTIONS:  Patient is using LTC insurance benefit to pay for caregivers. No concerns.      SOCIAL HX:  Social History   Tobacco Use  . Smoking status: Never Smoker  . Smokeless tobacco: Never Used  Substance Use Topics  . Alcohol use: No    Alcohol/week: 0.0 standard drinks    CODE STATUS:   Code Status: Prior (DNR) ADVANCED DIRECTIVES: Y MOST FORM COMPLETE:  Yes. HOSPICE EDUCATION PROVIDED: None.  PPS: Patient is ambulating with walker and gait belt, with one person assist. Patient is able to feed herself. Patient needs assistance with bathing, dressing and tolieting.  I spent85mnutes with patient/family, from1:00-1:45pproviding education, support and consultation.   WMargaretmary Lombard LCSW

## 2019-09-22 DIAGNOSIS — K649 Unspecified hemorrhoids: Secondary | ICD-10-CM | POA: Diagnosis not present

## 2019-09-22 DIAGNOSIS — D699 Hemorrhagic condition, unspecified: Secondary | ICD-10-CM | POA: Diagnosis not present

## 2019-09-24 ENCOUNTER — Telehealth: Payer: Self-pay

## 2019-09-24 DIAGNOSIS — J309 Allergic rhinitis, unspecified: Secondary | ICD-10-CM

## 2019-09-24 DIAGNOSIS — K449 Diaphragmatic hernia without obstruction or gangrene: Secondary | ICD-10-CM

## 2019-09-24 DIAGNOSIS — Z8673 Personal history of transient ischemic attack (TIA), and cerebral infarction without residual deficits: Secondary | ICD-10-CM

## 2019-09-24 DIAGNOSIS — M1990 Unspecified osteoarthritis, unspecified site: Secondary | ICD-10-CM

## 2019-09-24 DIAGNOSIS — I959 Hypotension, unspecified: Secondary | ICD-10-CM | POA: Diagnosis not present

## 2019-09-24 DIAGNOSIS — F0390 Unspecified dementia without behavioral disturbance: Secondary | ICD-10-CM | POA: Diagnosis not present

## 2019-09-24 DIAGNOSIS — E559 Vitamin D deficiency, unspecified: Secondary | ICD-10-CM

## 2019-09-24 DIAGNOSIS — N1832 Chronic kidney disease, stage 3b: Secondary | ICD-10-CM

## 2019-09-24 DIAGNOSIS — K649 Unspecified hemorrhoids: Secondary | ICD-10-CM | POA: Diagnosis not present

## 2019-09-24 DIAGNOSIS — Z9181 History of falling: Secondary | ICD-10-CM

## 2019-09-24 DIAGNOSIS — M810 Age-related osteoporosis without current pathological fracture: Secondary | ICD-10-CM

## 2019-09-24 DIAGNOSIS — Z8781 Personal history of (healed) traumatic fracture: Secondary | ICD-10-CM

## 2019-09-24 DIAGNOSIS — K8020 Calculus of gallbladder without cholecystitis without obstruction: Secondary | ICD-10-CM

## 2019-09-24 DIAGNOSIS — F419 Anxiety disorder, unspecified: Secondary | ICD-10-CM

## 2019-09-24 DIAGNOSIS — I82403 Acute embolism and thrombosis of unspecified deep veins of lower extremity, bilateral: Secondary | ICD-10-CM | POA: Diagnosis not present

## 2019-09-24 DIAGNOSIS — I700 Atherosclerosis of aorta: Secondary | ICD-10-CM

## 2019-09-24 DIAGNOSIS — K5730 Diverticulosis of large intestine without perforation or abscess without bleeding: Secondary | ICD-10-CM

## 2019-09-24 DIAGNOSIS — E7800 Pure hypercholesterolemia, unspecified: Secondary | ICD-10-CM

## 2019-09-24 DIAGNOSIS — I1310 Hypertensive heart and chronic kidney disease without heart failure, with stage 1 through stage 4 chronic kidney disease, or unspecified chronic kidney disease: Secondary | ICD-10-CM

## 2019-09-24 MED ORDER — POLYSACCHARIDE IRON COMPLEX 150 MG PO CAPS: 150.0000 mg | ORAL_CAPSULE | Freq: Two times a day (BID) | ORAL | 1 refills | Status: DC

## 2019-09-24 NOTE — Telephone Encounter (Signed)
That is a very low Hb and usually requires a blood transfusion but given her current state and palliative care status /etc I would like to try iron first   I pended niferex 150 mg to send to pharmacy -please send if agreeable This can cause some constipation (but less so than some other forms of iron) so continue fluids and fiber  If she suddenly becomes very lethargic or any other new symptoms go to ER   Watch for bleeding   I want to re check cbc in a week- at the very least I want to watch for stability if not slight improvement

## 2019-09-24 NOTE — Telephone Encounter (Signed)
[name] notified of Dr. Marliss Coots instructions and verbalized understanding. Amy nurse notified of Dr. Marliss Coots plan, Amy will be out to see pt on Tuesday so she will recheck labs then

## 2019-09-24 NOTE — Telephone Encounter (Addendum)
Copy of labs sent to GI  Called daughter [name], she said pt isn't bleeding right now it has been several days since bleeding but she had just stopped right before labs were done. Pt is very tired and sleeps most of the day. Daughter not sure if it's due to her Hb or her "stage of dementia" she is in. Pt also has no appetite they can hardly get her to eat but she will drink still. Pt isn't SOB but she is still saying her legs hurt often but it is usually resolved with her taking tylenol. They just saw GI on 09/21/19 and they were advised to increase fiber so daughter is trying to do her best with that given the fact that she will not eat much. Daughter said that the GI didn't mention that she should take iron so pt isn't taking any meds or supplements with iron in them, the home health nurse verified she isn't after they received her lab results also. [name] said they were able to get her in with the GI specialist for hemorrhoids but they said it's to risky to do anything or have any surgeries so they are just sticking with what they have been doing and trying to keep pt comfortable.  [name] did ask if pt needs to be on iron, they are willing to try it if Dr. Glori Bickers thinks it will help

## 2019-09-24 NOTE — Telephone Encounter (Signed)
Amy, nurse with [facility], l/m stating she drew CBC with diff on patient on 09/22/19 and just got the results last night-09/23/19. She was not sure if we got a call about this yet but the following are her results:  Hemoglobin 7.0 Hematocrit 23.5 RBC 2.51 MCHC 29.8  Amy did not know if Dr Glori Bickers wanted her to re draw labs on patient or what the plan was moving forward. Per patient's daughter, patient is doing fine and is asymptomatic. Amy's CB is [phone].

## 2019-09-24 NOTE — Telephone Encounter (Signed)
Please ask HH to send a copy to her GI doctor   Let family know- Hb is back down (it had improved much the last time I saw her)  is she tired/sob etc?   I don't think she is currently taking any iron -but please verify that for me How has the rectal bleeding been lately?  Thanks

## 2019-09-24 NOTE — Telephone Encounter (Signed)
Received labs this morning, placed in Dr. Marliss Coots inbox for review

## 2019-09-24 NOTE — Addendum Note (Signed)
Addended by: Loura Pardon A on: 09/24/2019 01:23 PM   Modules accepted: Orders

## 2019-09-24 NOTE — Addendum Note (Signed)
Addended by: Tammi Sou on: 09/24/2019 04:37 PM   Modules accepted: Orders

## 2019-09-28 DIAGNOSIS — D699 Hemorrhagic condition, unspecified: Secondary | ICD-10-CM | POA: Diagnosis not present

## 2019-09-28 DIAGNOSIS — D649 Anemia, unspecified: Secondary | ICD-10-CM | POA: Diagnosis not present

## 2019-09-30 ENCOUNTER — Telehealth: Payer: Self-pay | Admitting: *Deleted

## 2019-09-30 NOTE — Telephone Encounter (Signed)
Daughter [name]) advised.

## 2019-09-30 NOTE — Telephone Encounter (Signed)
Received labs back from [facility].   Pt's Hemoglobin was 7.3, RBC 2.74, Hematocrit 23.6, MCHC 30.9 will route to in office provider and PCP due to pt still having severely low hemoglobin and PCP not in office.   **Please see 09/24/19 phone note where labs were previously done and labs results were as followed:  Hemoglobin 7.0 Hematocrit 23.5 RBC 2.51 MCHC 29.8

## 2019-09-30 NOTE — Telephone Encounter (Signed)
That is stable to slightly improved, please ask how she is feeling and tolerating the iron and will decide when to re check

## 2019-09-30 NOTE — Telephone Encounter (Signed)
See note from PCP.  Thanks.

## 2019-10-04 ENCOUNTER — Telehealth: Payer: Self-pay | Admitting: Family Medicine

## 2019-10-04 NOTE — Telephone Encounter (Signed)
If she tolerating the iron well, I want to re check cbc in 2 weeks please for anemia  Please keep me posted

## 2019-10-05 NOTE — Telephone Encounter (Signed)
Amy notified of Dr. Marliss Coots instructions. She will get CBC labs in 2 weeks when she sees pt

## 2019-10-05 NOTE — Telephone Encounter (Signed)
Amy nurse with [facility] left v/m that pts daughter had advised Amy that pt was going to continue oral iron supplement and needed lab testing done this wk. Amy request cb with lab orders and when does pt needs lab testing done.

## 2019-10-11 DIAGNOSIS — J309 Allergic rhinitis, unspecified: Secondary | ICD-10-CM | POA: Diagnosis not present

## 2019-10-11 DIAGNOSIS — K8020 Calculus of gallbladder without cholecystitis without obstruction: Secondary | ICD-10-CM | POA: Diagnosis not present

## 2019-10-11 DIAGNOSIS — K5730 Diverticulosis of large intestine without perforation or abscess without bleeding: Secondary | ICD-10-CM | POA: Diagnosis not present

## 2019-10-11 DIAGNOSIS — F0390 Unspecified dementia without behavioral disturbance: Secondary | ICD-10-CM | POA: Diagnosis not present

## 2019-10-11 DIAGNOSIS — Z8673 Personal history of transient ischemic attack (TIA), and cerebral infarction without residual deficits: Secondary | ICD-10-CM | POA: Diagnosis not present

## 2019-10-11 DIAGNOSIS — M810 Age-related osteoporosis without current pathological fracture: Secondary | ICD-10-CM | POA: Diagnosis not present

## 2019-10-11 DIAGNOSIS — E559 Vitamin D deficiency, unspecified: Secondary | ICD-10-CM | POA: Diagnosis not present

## 2019-10-11 DIAGNOSIS — N1832 Chronic kidney disease, stage 3b: Secondary | ICD-10-CM | POA: Diagnosis not present

## 2019-10-11 DIAGNOSIS — M1990 Unspecified osteoarthritis, unspecified site: Secondary | ICD-10-CM | POA: Diagnosis not present

## 2019-10-11 DIAGNOSIS — I82403 Acute embolism and thrombosis of unspecified deep veins of lower extremity, bilateral: Secondary | ICD-10-CM | POA: Diagnosis not present

## 2019-10-11 DIAGNOSIS — Z9181 History of falling: Secondary | ICD-10-CM | POA: Diagnosis not present

## 2019-10-11 DIAGNOSIS — K449 Diaphragmatic hernia without obstruction or gangrene: Secondary | ICD-10-CM | POA: Diagnosis not present

## 2019-10-11 DIAGNOSIS — F419 Anxiety disorder, unspecified: Secondary | ICD-10-CM | POA: Diagnosis not present

## 2019-10-11 DIAGNOSIS — K649 Unspecified hemorrhoids: Secondary | ICD-10-CM | POA: Diagnosis not present

## 2019-10-11 DIAGNOSIS — E7800 Pure hypercholesterolemia, unspecified: Secondary | ICD-10-CM | POA: Diagnosis not present

## 2019-10-11 DIAGNOSIS — I959 Hypotension, unspecified: Secondary | ICD-10-CM | POA: Diagnosis not present

## 2019-10-11 DIAGNOSIS — I1310 Hypertensive heart and chronic kidney disease without heart failure, with stage 1 through stage 4 chronic kidney disease, or unspecified chronic kidney disease: Secondary | ICD-10-CM | POA: Diagnosis not present

## 2019-10-11 DIAGNOSIS — I700 Atherosclerosis of aorta: Secondary | ICD-10-CM | POA: Diagnosis not present

## 2019-10-11 DIAGNOSIS — Z8781 Personal history of (healed) traumatic fracture: Secondary | ICD-10-CM | POA: Diagnosis not present

## 2019-10-12 ENCOUNTER — Telehealth: Payer: Self-pay

## 2019-10-12 NOTE — Telephone Encounter (Signed)
Telephone call to patients [name] to schedule palliative care visit.  RN left message requesting call back to schedule palliative care visit.

## 2019-10-12 NOTE — Telephone Encounter (Signed)
Patients daughter [name] returned RN's call to schedule palliative care visit.  [name] in agreement with RN making home visit on 10/21/19 at 1:30 PM.

## 2019-10-13 ENCOUNTER — Encounter: Payer: Self-pay | Admitting: Family Medicine

## 2019-10-13 DIAGNOSIS — D699 Hemorrhagic condition, unspecified: Secondary | ICD-10-CM | POA: Diagnosis not present

## 2019-10-13 DIAGNOSIS — D649 Anemia, unspecified: Secondary | ICD-10-CM | POA: Diagnosis not present

## 2019-10-15 ENCOUNTER — Telehealth: Payer: Self-pay | Admitting: Family Medicine

## 2019-10-15 MED ORDER — POLYSACCHARIDE IRON COMPLEX 150 MG PO CAPS: 150.0000 mg | ORAL_CAPSULE | Freq: Two times a day (BID) | ORAL | 5 refills | Status: DC

## 2019-10-15 NOTE — Telephone Encounter (Signed)
Spoke with daughter [name] and she advised me that pt is tolerating iron so they will continue med. Called Amy, [facility] nurse and she will draw labs in a month but if new sxs or bleeding occurs sooner they will update [place]

## 2019-10-15 NOTE — Telephone Encounter (Signed)
I received pt's f/u blood work  Hb is up to 8.6 and HCG 28.8   (making progress)   Continue the iron /I hope she tolerates it  Keep me posted re: bleeding  Please re check cbc with ferritin in about 4-6 weeks for anemia

## 2019-10-21 ENCOUNTER — Other Ambulatory Visit: Payer: Medicare Other

## 2019-10-21 ENCOUNTER — Other Ambulatory Visit: Payer: Self-pay

## 2019-10-21 DIAGNOSIS — Z515 Encounter for palliative care: Secondary | ICD-10-CM

## 2019-10-21 NOTE — Progress Notes (Signed)
PATIENT NAME: Wendy Krueger DOB: 1930/03/12 MRN: [id number]  PRIMARY CARE PROVIDER: Abner Greenspan, MD  RESPONSIBLE PARTY:  Acct ID - Guarantor Home Phone Work Phone Relationship Acct Type  [id number] [name], [name]  Self P/F     [address], [place], [place] [address]    PLAN OF CARE and INTERVENTIONS:               1.  GOALS OF CARE/ ADVANCE CARE PLANNING:  Remain at home with family and hired caregiver providing care to patient.               2.  PATIENT/CAREGIVER EDUCATION:  Education to care giver and son on fall precautions, education on need to keep LE's elevated to decrease edema, reviewed meds, support                4. PERSONAL EMERGENCY PLAN:  Patient's is never left alone, patients children and hired caregiver stay with patient and mange patients care.               5.  DISEASE STATUS: RN made scheduled palliative care home visit. RN met with patient, son Wendy Krueger and hired caregiver Wendy Krueger. Patient setting in lift chair. Patient pleasantly confused and does not recognize son. Patient unable to tell nurse when her birthday is as patient will be having a birthday on Monday. Patient cooperative with assessment. Patient denies pain and informed RN she is doing okay. Caregiver reports patient does continue to complain with left leg pain. Patient has had filters placed in leg due to having blood clots in leg. Patient is able to ambulate with her rollator walker and assist. Caregiver uses gait belt and lift chair to get patient up from chair to go to kitchen table to eat lunch. Patient has had no MD appointments since nurse made visit last month. Patients current weight 147 lbs. Son reports [facility] is also visiting patient. Patient also continues to have Hand. Patient was started on iron and fiber. Patient will not be having surgery for large hemorrhoid and skin tags on rectal area. Patient has had no bleeding from area per caregiver. Caregiver states when  patients bowels move at times patient will stay she hurts. Patients appetite is good. Patient typically gets up at 11 AM per son and caregiver. Patient has not suffered any recent falls. Caregiver reports patients nose runs and patient has a dry non productive cough that family and caregiver feels is allergy-related. Patient has not had any shortness of breath. Caregiver report she showers patient twice a week and sponge bathes patient on other days. Caregiver reports patient typically takes a nap every day. Patient has 2 + edema in feet and ankles and feet and lower legs are dry and scaly. Caregiver has been applying Vaseline and lotion to areas. Patient has no open areas of skin breakdown. Patient's vital signs are stable. Patient remains a DNR. Family remains in agreement with palliative care services. Son and caregiver encouraged to contact palliative care with questions or concerns.    HISTORY OF PRESENT ILLNESS:  Patient is a 86 year old female who resides at home.  Patient has a hired caregiver in home with her 5 days per week.  Family also stays with patient when caregiver is not in the home.  Patient is followed by palliative care and is seen monthly and PRN.   CODE STATUS: DNR  ADVANCED DIRECTIVES: Y MOST FORM: Yes PPS: 40%  PHYSICAL EXAM:   VITALS: Today's Vitals   10/21/19 1353  BP: 130/80  Pulse: 82  Resp: 16  Temp: (!) 97.2 F (36.2 C)  TempSrc: Temporal  SpO2: 98%  Weight: 147 lb (66.7 kg)  PainSc: 0-No pain    LUNGS: clear to auscultation  CARDIAC: Cor RRR  EXTREMITIES: 2+ edema SKIN: red dry areas on patients feet and lower legs.  NEURO: positive for gait problems and memory problems       Wendy Simmer, RN

## 2019-11-09 ENCOUNTER — Other Ambulatory Visit: Payer: Self-pay

## 2019-11-09 ENCOUNTER — Other Ambulatory Visit: Payer: Medicare Other

## 2019-11-09 DIAGNOSIS — D649 Anemia, unspecified: Secondary | ICD-10-CM | POA: Diagnosis not present

## 2019-11-09 DIAGNOSIS — Z515 Encounter for palliative care: Secondary | ICD-10-CM

## 2019-11-09 DIAGNOSIS — D699 Hemorrhagic condition, unspecified: Secondary | ICD-10-CM | POA: Diagnosis not present

## 2019-11-09 NOTE — Progress Notes (Signed)
COMMUNITY PALLIATIVE CARE SW NOTE  PATIENT NAME: Wendy Krueger DOB: December 23, 1929 MRN: [id number]  PRIMARY CARE PROVIDER: Abner Greenspan, MD  RESPONSIBLE PARTY:  Acct ID - Guarantor Home Phone Work Phone Relationship Acct Type  [id number] MACKENSIE, [phone]  Self P/F     [address], [place], [place] [address]     PLAN OF CARE and INTERVENTIONS:             1. GOALS OF CARE/ ADVANCE CARE PLANNING:  Patient is DNR, form is in the home. MOST form is completed. HCPOA is [name] and Brenda(patient's children). Goal is forpatient to be cared for and supported at home. 2. SOCIAL/EMOTIONAL/SPIRITUAL ASSESSMENT/ INTERVENTIONS:  SW completed TELEHEALTH visit with [name] (patient's daughter). [name] said patient was not doing "great". Patient has a fall on Thursday night and is sore. Patient is taking tylenol, as needed. [name] said she also has purchased a chair alarm and plans to put this in place today. Patient has been sleeping more since the fall. [name] also said patient's daughter tested positive for COVID-19 last week and the family has been cautious and monitoring closely. No concerns of symptoms for patient thus far. Patient's appetite remains good, does better with small portioned meals and snacks. Family is monitoring patient's blood pressure, as it is fluctuating. [name] has notified MD. SW provided supportive counseling, validated family's care, used active and reflective listening.SW updated Therapist, sports. 3. PATIENT/CAREGIVER EDUCATION/ COPING:  Patient remains alert, bue confused at times. Patient can be agitated especially with personal care. Ongoing discussions about engaging patient in activities like coloring, reading and puzzles. Family is supportive and involved. 4. PERSONAL EMERGENCY PLAN:  Family/caregiver will call 9-1-1 for emergencies. 5. COMMUNITY RESOURCES COORDINATION/ HEALTH CARE NAVIGATION:  [name] manages patient's care. Patient hasa hiredcaregiver for 5 hours per  day, with [employer].When caregiver is not with patient, family is providing care. Patient is possibly discharging from [facility] today. [name] is going to request extension with patient's changes. 6. FINANCIAL/LEGAL CONCERNS/INTERVENTIONS:  Patient is using her LTC insurance benefit to pay for caregivers.     SOCIAL HX:  Social History   Tobacco Use  . Smoking status: Never Smoker  . Smokeless tobacco: Never Used  Substance Use Topics  . Alcohol use: No    Alcohol/week: 0.0 standard drinks    CODE STATUS: DNR ADVANCED DIRECTIVES:Y MOST FORM COMPLETE:  Yes. HOSPICE EDUCATION PROVIDED: None.  PPS: Patient is ambulating with walker with one person assist, using gait belt. Patient is able to feed herself with prompting. Patient needs assistance with bathing, dressing and tolieting.  I spent35minutes with patient/family, from9:00-9:30aproviding education, support and consultation.  Margaretmary Lombard, LCSW

## 2019-11-10 ENCOUNTER — Telehealth: Payer: Self-pay

## 2019-11-10 DIAGNOSIS — M810 Age-related osteoporosis without current pathological fracture: Secondary | ICD-10-CM | POA: Diagnosis not present

## 2019-11-10 DIAGNOSIS — I82403 Acute embolism and thrombosis of unspecified deep veins of lower extremity, bilateral: Secondary | ICD-10-CM | POA: Diagnosis not present

## 2019-11-10 DIAGNOSIS — I1310 Hypertensive heart and chronic kidney disease without heart failure, with stage 1 through stage 4 chronic kidney disease, or unspecified chronic kidney disease: Secondary | ICD-10-CM | POA: Diagnosis not present

## 2019-11-10 DIAGNOSIS — K8020 Calculus of gallbladder without cholecystitis without obstruction: Secondary | ICD-10-CM | POA: Diagnosis not present

## 2019-11-10 DIAGNOSIS — K649 Unspecified hemorrhoids: Secondary | ICD-10-CM | POA: Diagnosis not present

## 2019-11-10 DIAGNOSIS — I959 Hypotension, unspecified: Secondary | ICD-10-CM | POA: Diagnosis not present

## 2019-11-10 DIAGNOSIS — K5730 Diverticulosis of large intestine without perforation or abscess without bleeding: Secondary | ICD-10-CM | POA: Diagnosis not present

## 2019-11-10 DIAGNOSIS — E559 Vitamin D deficiency, unspecified: Secondary | ICD-10-CM | POA: Diagnosis not present

## 2019-11-10 DIAGNOSIS — J309 Allergic rhinitis, unspecified: Secondary | ICD-10-CM | POA: Diagnosis not present

## 2019-11-10 DIAGNOSIS — I700 Atherosclerosis of aorta: Secondary | ICD-10-CM | POA: Diagnosis not present

## 2019-11-10 DIAGNOSIS — N1832 Chronic kidney disease, stage 3b: Secondary | ICD-10-CM | POA: Diagnosis not present

## 2019-11-10 DIAGNOSIS — Z8673 Personal history of transient ischemic attack (TIA), and cerebral infarction without residual deficits: Secondary | ICD-10-CM | POA: Diagnosis not present

## 2019-11-10 DIAGNOSIS — F419 Anxiety disorder, unspecified: Secondary | ICD-10-CM | POA: Diagnosis not present

## 2019-11-10 DIAGNOSIS — F0390 Unspecified dementia without behavioral disturbance: Secondary | ICD-10-CM | POA: Diagnosis not present

## 2019-11-10 DIAGNOSIS — M1990 Unspecified osteoarthritis, unspecified site: Secondary | ICD-10-CM | POA: Diagnosis not present

## 2019-11-10 DIAGNOSIS — Z8781 Personal history of (healed) traumatic fracture: Secondary | ICD-10-CM | POA: Diagnosis not present

## 2019-11-10 DIAGNOSIS — Z9181 History of falling: Secondary | ICD-10-CM | POA: Diagnosis not present

## 2019-11-10 DIAGNOSIS — E7800 Pure hypercholesterolemia, unspecified: Secondary | ICD-10-CM | POA: Diagnosis not present

## 2019-11-10 DIAGNOSIS — K449 Diaphragmatic hernia without obstruction or gangrene: Secondary | ICD-10-CM | POA: Diagnosis not present

## 2019-11-10 NOTE — Telephone Encounter (Signed)
Amy nurse with [facility] wanted Dr Glori Bickers to know that pt fell on buttock on 11/04/19. No apparent injury, no SOB and no bruising; pt is sore on buttock and rt rib cage area under breast.pt did not hit her head. Amy was going to d/c pt this wk from [facility] but since the fall and remote health put pt back on metoprolol 12.5 mg bid due to BP going back up again.  Amy is requesting verbal orders for [facility] nursing 1 x a wk for 9 wks. Also request [facility] PT evaluation due to fall. pts daughter has put alarm on recliner so if pt tries to get up will notify daughter. Amy also wants to verify that Dr Glori Bickers received lab results from 11/09/19 for CBC with diff, iron and total iron binding capacity that was sent to Dr Glori Bickers on 11/09/19. Amy request cb.

## 2019-11-10 NOTE — Telephone Encounter (Signed)
Verbal order given. I advised Amy that we will be on the look out for lab results. Amy did say pt's Hbg was 10.(something) so it has improved. With that being said Amy request that Dr. Glori Bickers to review pt's labs once she gets them and then let Amy know if and when we need her to do labs on pt and if so what labs and when.  Amy did say that pt's BP has been running high so they did start her back on Metoprolol 12.5 BID so they will keep [place] posted on BP if it doesn't start to go down

## 2019-11-10 NOTE — Telephone Encounter (Signed)
I have not rec the labs yet-I suspect they are in the fax pile waiting to be handed out and I will watch for them  Please ok all of those verbal orders Thanks

## 2019-11-12 DIAGNOSIS — Z043 Encounter for examination and observation following other accident: Secondary | ICD-10-CM | POA: Diagnosis not present

## 2019-11-12 DIAGNOSIS — I517 Cardiomegaly: Secondary | ICD-10-CM | POA: Diagnosis not present

## 2019-11-12 DIAGNOSIS — R079 Chest pain, unspecified: Secondary | ICD-10-CM | POA: Diagnosis not present

## 2019-11-25 DIAGNOSIS — I700 Atherosclerosis of aorta: Secondary | ICD-10-CM | POA: Diagnosis not present

## 2019-11-25 DIAGNOSIS — I959 Hypotension, unspecified: Secondary | ICD-10-CM | POA: Diagnosis not present

## 2019-11-25 DIAGNOSIS — F0390 Unspecified dementia without behavioral disturbance: Secondary | ICD-10-CM | POA: Diagnosis not present

## 2019-11-25 DIAGNOSIS — I82403 Acute embolism and thrombosis of unspecified deep veins of lower extremity, bilateral: Secondary | ICD-10-CM | POA: Diagnosis not present

## 2019-11-25 DIAGNOSIS — I1310 Hypertensive heart and chronic kidney disease without heart failure, with stage 1 through stage 4 chronic kidney disease, or unspecified chronic kidney disease: Secondary | ICD-10-CM | POA: Diagnosis not present

## 2019-11-25 DIAGNOSIS — N1832 Chronic kidney disease, stage 3b: Secondary | ICD-10-CM | POA: Diagnosis not present

## 2019-11-30 DIAGNOSIS — I700 Atherosclerosis of aorta: Secondary | ICD-10-CM

## 2019-11-30 DIAGNOSIS — I82403 Acute embolism and thrombosis of unspecified deep veins of lower extremity, bilateral: Secondary | ICD-10-CM

## 2019-11-30 DIAGNOSIS — K8020 Calculus of gallbladder without cholecystitis without obstruction: Secondary | ICD-10-CM

## 2019-11-30 DIAGNOSIS — Z8673 Personal history of transient ischemic attack (TIA), and cerebral infarction without residual deficits: Secondary | ICD-10-CM

## 2019-11-30 DIAGNOSIS — Z8781 Personal history of (healed) traumatic fracture: Secondary | ICD-10-CM

## 2019-11-30 DIAGNOSIS — F0390 Unspecified dementia without behavioral disturbance: Secondary | ICD-10-CM

## 2019-11-30 DIAGNOSIS — F419 Anxiety disorder, unspecified: Secondary | ICD-10-CM

## 2019-11-30 DIAGNOSIS — I959 Hypotension, unspecified: Secondary | ICD-10-CM

## 2019-11-30 DIAGNOSIS — E559 Vitamin D deficiency, unspecified: Secondary | ICD-10-CM

## 2019-11-30 DIAGNOSIS — K649 Unspecified hemorrhoids: Secondary | ICD-10-CM

## 2019-11-30 DIAGNOSIS — E7800 Pure hypercholesterolemia, unspecified: Secondary | ICD-10-CM

## 2019-11-30 DIAGNOSIS — M810 Age-related osteoporosis without current pathological fracture: Secondary | ICD-10-CM

## 2019-11-30 DIAGNOSIS — K449 Diaphragmatic hernia without obstruction or gangrene: Secondary | ICD-10-CM

## 2019-11-30 DIAGNOSIS — M1990 Unspecified osteoarthritis, unspecified site: Secondary | ICD-10-CM

## 2019-11-30 DIAGNOSIS — J309 Allergic rhinitis, unspecified: Secondary | ICD-10-CM

## 2019-11-30 DIAGNOSIS — Z9181 History of falling: Secondary | ICD-10-CM

## 2019-11-30 DIAGNOSIS — K5730 Diverticulosis of large intestine without perforation or abscess without bleeding: Secondary | ICD-10-CM

## 2019-11-30 DIAGNOSIS — I1310 Hypertensive heart and chronic kidney disease without heart failure, with stage 1 through stage 4 chronic kidney disease, or unspecified chronic kidney disease: Secondary | ICD-10-CM

## 2019-11-30 DIAGNOSIS — N1832 Chronic kidney disease, stage 3b: Secondary | ICD-10-CM

## 2019-12-01 DIAGNOSIS — F0390 Unspecified dementia without behavioral disturbance: Secondary | ICD-10-CM | POA: Diagnosis not present

## 2019-12-01 DIAGNOSIS — I959 Hypotension, unspecified: Secondary | ICD-10-CM | POA: Diagnosis not present

## 2019-12-01 DIAGNOSIS — I82403 Acute embolism and thrombosis of unspecified deep veins of lower extremity, bilateral: Secondary | ICD-10-CM | POA: Diagnosis not present

## 2019-12-01 DIAGNOSIS — I700 Atherosclerosis of aorta: Secondary | ICD-10-CM | POA: Diagnosis not present

## 2019-12-01 DIAGNOSIS — I1310 Hypertensive heart and chronic kidney disease without heart failure, with stage 1 through stage 4 chronic kidney disease, or unspecified chronic kidney disease: Secondary | ICD-10-CM | POA: Diagnosis not present

## 2019-12-01 DIAGNOSIS — N1832 Chronic kidney disease, stage 3b: Secondary | ICD-10-CM | POA: Diagnosis not present

## 2019-12-02 ENCOUNTER — Telehealth: Payer: Self-pay

## 2019-12-02 NOTE — Telephone Encounter (Signed)
Verbal order given to Amy. She will draw labs next week when she see's pt again

## 2019-12-02 NOTE — Telephone Encounter (Signed)
Please ok those verbal orders  

## 2019-12-02 NOTE — Telephone Encounter (Signed)
I would like a cbc and ferritin this month please for anemia  Thanks for the updates

## 2019-12-02 NOTE — Telephone Encounter (Signed)
Amy nurse with [facility] left v/m requesting verbal orders for [facility] nursing the next time Dr Glori Bickers wants pt to have CBC with diff and pt also needs refill for iron med. As FYI Amy wanted Dr Glori Bickers to be aware since Amy's last verbal order request pt has had tested + for covid; pt has recovered and is doing well now; [facility] followed pt while recovering from covid. Amy request cb. I spoke with Amy because Melanie at [facility] said Ferex 150 mg # 180 was filled on 10/17/19 and that should be all needed until mid 12/2019. Amy will let family be aware.

## 2019-12-02 NOTE — Telephone Encounter (Signed)
Tharon Aquas PT with [facility] left v/m requesting verbal orders for [facility] PT 1 x a wk for 6 wks for ambulation and transfer.

## 2019-12-02 NOTE — Telephone Encounter (Signed)
Verbal orders given to [facility]

## 2019-12-06 DIAGNOSIS — M1611 Unilateral primary osteoarthritis, right hip: Secondary | ICD-10-CM | POA: Diagnosis not present

## 2019-12-07 ENCOUNTER — Inpatient Hospital Stay (HOSPITAL_COMMUNITY)
Admission: EM | Admit: 2019-12-07 | Discharge: 2019-12-10 | DRG: 522 | Disposition: A | Discharge status: Home Health | Payer: Medicare Other | Source: Ambulatory Visit | Attending: Family Medicine | Admitting: Family Medicine

## 2019-12-07 ENCOUNTER — Ambulatory Visit: Payer: Self-pay | Admitting: Orthopedic Surgery

## 2019-12-07 ENCOUNTER — Other Ambulatory Visit: Payer: Self-pay

## 2019-12-07 ENCOUNTER — Emergency Department (HOSPITAL_COMMUNITY): Payer: Medicare Other

## 2019-12-07 ENCOUNTER — Encounter (HOSPITAL_COMMUNITY): Payer: Self-pay | Admitting: Emergency Medicine

## 2019-12-07 ENCOUNTER — Inpatient Hospital Stay (HOSPITAL_COMMUNITY): Payer: Medicare Other

## 2019-12-07 DIAGNOSIS — I700 Atherosclerosis of aorta: Secondary | ICD-10-CM | POA: Diagnosis not present

## 2019-12-07 DIAGNOSIS — S72001A Fracture of unspecified part of neck of right femur, initial encounter for closed fracture: Secondary | ICD-10-CM | POA: Diagnosis not present

## 2019-12-07 DIAGNOSIS — I129 Hypertensive chronic kidney disease with stage 1 through stage 4 chronic kidney disease, or unspecified chronic kidney disease: Secondary | ICD-10-CM | POA: Diagnosis present

## 2019-12-07 DIAGNOSIS — Z419 Encounter for procedure for purposes other than remedying health state, unspecified: Secondary | ICD-10-CM

## 2019-12-07 DIAGNOSIS — Z8616 Personal history of COVID-19: Secondary | ICD-10-CM

## 2019-12-07 DIAGNOSIS — K8020 Calculus of gallbladder without cholecystitis without obstruction: Secondary | ICD-10-CM | POA: Diagnosis not present

## 2019-12-07 DIAGNOSIS — K449 Diaphragmatic hernia without obstruction or gangrene: Secondary | ICD-10-CM | POA: Diagnosis not present

## 2019-12-07 DIAGNOSIS — Z888 Allergy status to other drugs, medicaments and biological substances status: Secondary | ICD-10-CM | POA: Diagnosis not present

## 2019-12-07 DIAGNOSIS — F0390 Unspecified dementia without behavioral disturbance: Secondary | ICD-10-CM | POA: Diagnosis present

## 2019-12-07 DIAGNOSIS — Z66 Do not resuscitate: Secondary | ICD-10-CM | POA: Diagnosis present

## 2019-12-07 DIAGNOSIS — Z20822 Contact with and (suspected) exposure to covid-19: Secondary | ICD-10-CM | POA: Diagnosis present

## 2019-12-07 DIAGNOSIS — M810 Age-related osteoporosis without current pathological fracture: Secondary | ICD-10-CM | POA: Diagnosis not present

## 2019-12-07 DIAGNOSIS — Z95828 Presence of other vascular implants and grafts: Secondary | ICD-10-CM

## 2019-12-07 DIAGNOSIS — M25561 Pain in right knee: Secondary | ICD-10-CM | POA: Diagnosis not present

## 2019-12-07 DIAGNOSIS — K5730 Diverticulosis of large intestine without perforation or abscess without bleeding: Secondary | ICD-10-CM | POA: Diagnosis not present

## 2019-12-07 DIAGNOSIS — Z79890 Hormone replacement therapy (postmenopausal): Secondary | ICD-10-CM

## 2019-12-07 DIAGNOSIS — Z8249 Family history of ischemic heart disease and other diseases of the circulatory system: Secondary | ICD-10-CM

## 2019-12-07 DIAGNOSIS — Y92009 Unspecified place in unspecified non-institutional (private) residence as the place of occurrence of the external cause: Secondary | ICD-10-CM | POA: Diagnosis not present

## 2019-12-07 DIAGNOSIS — N1832 Chronic kidney disease, stage 3b: Secondary | ICD-10-CM | POA: Diagnosis present

## 2019-12-07 DIAGNOSIS — I82403 Acute embolism and thrombosis of unspecified deep veins of lower extremity, bilateral: Secondary | ICD-10-CM | POA: Diagnosis not present

## 2019-12-07 DIAGNOSIS — D509 Iron deficiency anemia, unspecified: Secondary | ICD-10-CM | POA: Diagnosis present

## 2019-12-07 DIAGNOSIS — W010XXA Fall on same level from slipping, tripping and stumbling without subsequent striking against object, initial encounter: Secondary | ICD-10-CM | POA: Diagnosis present

## 2019-12-07 DIAGNOSIS — Z79899 Other long term (current) drug therapy: Secondary | ICD-10-CM

## 2019-12-07 DIAGNOSIS — I959 Hypotension, unspecified: Secondary | ICD-10-CM | POA: Diagnosis not present

## 2019-12-07 DIAGNOSIS — M1990 Unspecified osteoarthritis, unspecified site: Secondary | ICD-10-CM | POA: Diagnosis not present

## 2019-12-07 DIAGNOSIS — F0150 Vascular dementia without behavioral disturbance: Secondary | ICD-10-CM

## 2019-12-07 DIAGNOSIS — S72034A Nondisplaced midcervical fracture of right femur, initial encounter for closed fracture: Secondary | ICD-10-CM | POA: Diagnosis not present

## 2019-12-07 DIAGNOSIS — S72011A Unspecified intracapsular fracture of right femur, initial encounter for closed fracture: Secondary | ICD-10-CM | POA: Diagnosis present

## 2019-12-07 DIAGNOSIS — E785 Hyperlipidemia, unspecified: Secondary | ICD-10-CM | POA: Diagnosis present

## 2019-12-07 DIAGNOSIS — I82503 Chronic embolism and thrombosis of unspecified deep veins of lower extremity, bilateral: Secondary | ICD-10-CM | POA: Diagnosis present

## 2019-12-07 DIAGNOSIS — E876 Hypokalemia: Secondary | ICD-10-CM | POA: Diagnosis not present

## 2019-12-07 DIAGNOSIS — I10 Essential (primary) hypertension: Secondary | ICD-10-CM | POA: Diagnosis present

## 2019-12-07 DIAGNOSIS — E559 Vitamin D deficiency, unspecified: Secondary | ICD-10-CM | POA: Diagnosis not present

## 2019-12-07 DIAGNOSIS — Z471 Aftercare following joint replacement surgery: Secondary | ICD-10-CM | POA: Diagnosis not present

## 2019-12-07 DIAGNOSIS — Z8673 Personal history of transient ischemic attack (TIA), and cerebral infarction without residual deficits: Secondary | ICD-10-CM | POA: Diagnosis not present

## 2019-12-07 DIAGNOSIS — S72001D Fracture of unspecified part of neck of right femur, subsequent encounter for closed fracture with routine healing: Secondary | ICD-10-CM | POA: Diagnosis not present

## 2019-12-07 DIAGNOSIS — J309 Allergic rhinitis, unspecified: Secondary | ICD-10-CM | POA: Diagnosis not present

## 2019-12-07 DIAGNOSIS — K649 Unspecified hemorrhoids: Secondary | ICD-10-CM | POA: Diagnosis not present

## 2019-12-07 DIAGNOSIS — E7800 Pure hypercholesterolemia, unspecified: Secondary | ICD-10-CM | POA: Diagnosis not present

## 2019-12-07 DIAGNOSIS — I82409 Acute embolism and thrombosis of unspecified deep veins of unspecified lower extremity: Secondary | ICD-10-CM | POA: Diagnosis not present

## 2019-12-07 DIAGNOSIS — W19XXXA Unspecified fall, initial encounter: Secondary | ICD-10-CM | POA: Diagnosis not present

## 2019-12-07 DIAGNOSIS — F419 Anxiety disorder, unspecified: Secondary | ICD-10-CM | POA: Diagnosis not present

## 2019-12-07 DIAGNOSIS — Z96641 Presence of right artificial hip joint: Secondary | ICD-10-CM | POA: Diagnosis not present

## 2019-12-07 DIAGNOSIS — Z8781 Personal history of (healed) traumatic fracture: Secondary | ICD-10-CM | POA: Diagnosis not present

## 2019-12-07 DIAGNOSIS — I1310 Hypertensive heart and chronic kidney disease without heart failure, with stage 1 through stage 4 chronic kidney disease, or unspecified chronic kidney disease: Secondary | ICD-10-CM | POA: Diagnosis not present

## 2019-12-07 DIAGNOSIS — Z9181 History of falling: Secondary | ICD-10-CM | POA: Diagnosis not present

## 2019-12-07 LAB — COMPREHENSIVE METABOLIC PANEL
ALT: 22 U/L (ref 0–44)
AST: 41 U/L (ref 15–41)
Albumin: 3.3 g/dL — ABNORMAL LOW (ref 3.5–5.0)
Alkaline Phosphatase: 123 U/L (ref 38–126)
Anion gap: 9 (ref 5–15)
BUN: 21 mg/dL (ref 8–23)
CO2: 25 mmol/L (ref 22–32)
Calcium: 9 mg/dL (ref 8.9–10.3)
Chloride: 106 mmol/L (ref 98–111)
Creatinine, Ser: 0.88 mg/dL (ref 0.44–1.00)
GFR calc Af Amer: >60 mL/min (ref >60)
GFR calc non Af Amer: 59 mL/min — ABNORMAL LOW (ref >60)
Glucose, Bld: 99 mg/dL (ref 70–99)
Potassium: 4.3 mmol/L (ref 3.5–5.1)
Sodium: 140 mmol/L (ref 135–145)
Total Bilirubin: 0.6 mg/dL (ref 0.3–1.2)
Total Protein: 6.8 g/dL (ref 6.5–8.1)

## 2019-12-07 LAB — CBC WITH DIFFERENTIAL/PLATELET
Abs Immature Granulocytes: 0.02 10*3/uL (ref 0.00–0.07)
Basophils Absolute: 0 10*3/uL (ref 0.0–0.1)
Basophils Relative: 1 %
Eosinophils Absolute: 0.1 10*3/uL (ref 0.0–0.5)
Eosinophils Relative: 3 %
HCT: 42.3 % (ref 36.0–46.0)
Hemoglobin: 12.5 g/dL (ref 12.0–15.0)
Immature Granulocytes: 0 %
Lymphocytes Relative: 18 %
Lymphs Abs: 1 10*3/uL (ref 0.7–4.0)
MCH: 27.5 pg (ref 26.0–34.0)
MCHC: 29.6 g/dL — ABNORMAL LOW (ref 30.0–36.0)
MCV: 93.2 fL (ref 80.0–100.0)
Monocytes Absolute: 0.4 10*3/uL (ref 0.1–1.0)
Monocytes Relative: 8 %
Neutro Abs: 4 10*3/uL (ref 1.7–7.7)
Neutrophils Relative %: 70 %
Platelets: 162 10*3/uL (ref 150–400)
RBC: 4.54 MIL/uL (ref 3.87–5.11)
RDW: 17.3 % — ABNORMAL HIGH (ref 11.5–15.5)
WBC: 5.7 10*3/uL (ref 4.0–10.5)
nRBC: 0 % (ref 0.0–0.2)

## 2019-12-07 LAB — PROTIME-INR
INR: 0.9 (ref 0.8–1.2)
Prothrombin Time: 12 seconds (ref 11.4–15.2)

## 2019-12-07 LAB — SARS CORONAVIRUS 2 BY RT PCR (HOSPITAL ORDER, PERFORMED IN ~~LOC~~ HOSPITAL LAB): SARS Coronavirus 2: POSITIVE — AB

## 2019-12-07 MED ORDER — POLYSACCHARIDE IRON COMPLEX 150 MG PO CAPS
150.0000 mg | ORAL_CAPSULE | Freq: Two times a day (BID) | ORAL | Status: DC
  Administered 2019-12-08 – 2019-12-10 (×5): 150 mg via ORAL
  Filled 2019-12-07 (×5): qty 1

## 2019-12-07 MED ORDER — HYDROCODONE-ACETAMINOPHEN 5-325 MG PO TABS
1.0000 | ORAL_TABLET | Freq: Once | ORAL | Status: AC
  Administered 2019-12-07: 1 via ORAL
  Filled 2019-12-07: qty 1

## 2019-12-07 MED ORDER — MELATONIN 5 MG PO TABS
10.0000 mg | ORAL_TABLET | Freq: Every day | ORAL | Status: DC
  Administered 2019-12-08 – 2019-12-09 (×3): 10 mg via ORAL
  Filled 2019-12-07 (×3): qty 2

## 2019-12-07 MED ORDER — POLYETHYLENE GLYCOL 3350 17 G PO PACK: 17.0000 g | PACK | Freq: Every day | ORAL | Status: DC

## 2019-12-07 MED ORDER — DONEPEZIL HCL 10 MG PO TABS
10.0000 mg | ORAL_TABLET | Freq: Every day | ORAL | Status: DC
  Administered 2019-12-08 – 2019-12-09 (×3): 10 mg via ORAL
  Filled 2019-12-07 (×2): qty 1
  Filled 2019-12-07: qty 2

## 2019-12-07 MED ORDER — METOPROLOL TARTRATE 25 MG PO TABS
25.0000 mg | ORAL_TABLET | Freq: Two times a day (BID) | ORAL | Status: DC
  Administered 2019-12-08 – 2019-12-10 (×6): 25 mg via ORAL
  Filled 2019-12-07 (×6): qty 1

## 2019-12-07 MED ORDER — ONDANSETRON HCL 4 MG/2ML IJ SOLN
4.0000 mg | Freq: Once | INTRAMUSCULAR | Status: AC
  Administered 2019-12-07: 4 mg via INTRAVENOUS
  Filled 2019-12-07: qty 2

## 2019-12-07 MED ORDER — VITAMIN B-12 1000 MCG PO TABS
1000.0000 ug | ORAL_TABLET | Freq: Every day | ORAL | Status: DC
  Administered 2019-12-08 – 2019-12-10 (×3): 1000 ug via ORAL
  Filled 2019-12-07 (×3): qty 1

## 2019-12-07 MED ORDER — MORPHINE SULFATE (PF) 2 MG/ML IV SOLN: 0.5000 mg | INTRAVENOUS | Status: DC | PRN

## 2019-12-07 MED ORDER — MEMANTINE HCL 10 MG PO TABS
10.0000 mg | ORAL_TABLET | Freq: Two times a day (BID) | ORAL | Status: DC
  Administered 2019-12-08 – 2019-12-10 (×6): 10 mg via ORAL
  Filled 2019-12-07 (×6): qty 1

## 2019-12-07 NOTE — ED Notes (Signed)
Pt is on Pure wick and has very little output. Unable to get a urine sample.

## 2019-12-07 NOTE — ED Notes (Signed)
Patient provided with sandwich, applesauce, and sprite per request. Hospitalist states patient okay to eat before midnight.

## 2019-12-07 NOTE — ED Triage Notes (Signed)
Pt had a fall on Saturday. Pt had imaging at [facility] and has hip fracture and referred to ED. Right hip

## 2019-12-07 NOTE — ED Provider Notes (Signed)
[name] DEPT Provider Note   CSN: [id number] Arrival date & time: 12/07/19  1715     History Chief Complaint  Patient presents with  . Hip Injury    Wendy Krueger is a 86 y.o. female.  The history is provided by the patient, medical records and a relative. No language interpreter was used.   Wendy Krueger is a 86 y.o. female who presents to the Emergency Department complaining of fall, hip injury.  Level V caveat due to dementia.  She presents to the ED accompanied by her daughter.  She had a witnessed fall at home on Saturday.  She was walking and fell forward, onto her knee and her right leg twisted behind her.  She had an xray at home yesterday that was concerning for fracture.  [facility] and was told she had a hip fracture and to go to the ED for further eval.  She usually walks with a walker and an assistant helping her.  No reports of head injury.  No recent illnesses. No vomiting, chest pain, abdominal pain.  She had covid 4 weeks ago.  Has not been vaccinated for covid 19.  She has a hx/o DVT s/p IVC filter placement in January.      Past Medical History:  Diagnosis Date  . Asymptomatic gallstones   . BRBPR (bright red blood per rectum)   . Cataract 04/13/2018   bilateral eyes  . Cholelithiasis   . Clot    RLE blood clots; s/p IVC filter  . Colitis - presumed infectious origin    one ER visit   . Dementia ([place])   . Duodenitis   . Fall   . Gastritis   . GI bleed   . Hemorrhoid    bleeding  . Hiatal hernia   . Hyperlipidemia   . Hypertension   . Osteoarthritis   . Osteoporosis   . Stroke ([place])   . Syncope     Patient Active Problem List   Diagnosis Date Noted  . Pedal edema 08/13/2019  . Polyp of colon   . Gastrointestinal hemorrhage   . Chronic renal failure, stage 3b   . DVT of lower extremity, bilateral ([place]) 08/06/2019  . Bleeding hemorrhoids 08/06/2019  . DVT, lower extremity, distal,  acute, bilateral ([place]) 08/06/2019  . Advanced dementia ([place]) 08/02/2019  . DVT (deep venous thrombosis) ([place]) 07/13/2019  . Abnormal CT of the abdomen   . External hemorrhoids   . Lower GI bleed 07/06/2019  . Transient alteration of awareness 07/14/2018  . Chronic diarrhea 02/17/2018  . History of fracture of radius 02/17/2018  . Hypokalemia 02/09/2018  . Hypotension 02/09/2018  . History of lower GI bleeding 02/09/2018  . Acute blood loss anemia 02/09/2018  . Acute encephalopathy 05/08/2017  . History of CVA (cerebrovascular accident) 03/10/2016  . Carotid aneurysm, right ([place]) 02/21/2016  . Lipoma of shoulder 03/27/2015  . History of closed Colles' fracture 09/06/2014  . Allergic rhinitis 06/20/2014  . Diastolic dysfunction 26/94/8546  . Iron deficiency anemia 03/25/2014  . History of GI bleed 03/21/2014  . BRBPR (bright red blood per rectum) 03/19/2014  . Dementia ([place]) 09/01/2013  . Anxiety 09/01/2013  . Encounter for Medicare annual wellness exam 01/08/2013  . Gallstones 11/11/2011  . 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 Risk for falls 06/04/2011  . Constipation 03/05/2011  . Vitamin D deficiency 03/28/2009  . HYPERCHOLESTEROLEMIA, PURE 03/17/2007  . Essential hypertension 03/16/2007  .  OSTEOARTHRITIS 03/16/2007  . Osteoporosis 03/16/2007    Past Surgical History:  Procedure Laterality Date  . ABDOMINAL HYSTERECTOMY     BSO- fibroids  . ABI's     normal  . COLONOSCOPY  08/09/2019   Procedure: COLONOSCOPY;  Surgeon: Lucilla Lame, MD;  Location: [facility] ENDOSCOPY;  Service: Endoscopy;;  . dexa  1/05   osteoporosis  . ESOPHAGOGASTRODUODENOSCOPY N/A 03/21/2014   Procedure: ESOPHAGOGASTRODUODENOSCOPY (EGD);  Surgeon: Milus Banister, MD;  Location: [place];  Service: Endoscopy;  Laterality: N/A;  . FLEXIBLE SIGMOIDOSCOPY N/A 08/08/2019   Procedure: FLEXIBLE SIGMOIDOSCOPY;  Surgeon: Jonathon Bellows, MD;  Location: [facility] ENDOSCOPY;  Service: Gastroenterology;   Laterality: N/A;  . FLEXIBLE SIGMOIDOSCOPY N/A 08/09/2019   Procedure: FLEXIBLE SIGMOIDOSCOPY;  Surgeon: Lucilla Lame, MD;  Location: [facility] ENDOSCOPY;  Service: Endoscopy;  Laterality: N/A;  . IR IVC FILTER PLMT / S&I /IMG GUID/MOD SED  07/06/2019  . IVC FILTER INSERTION  07/2019  . left foot brace    . WRIST FRACTURE SURGERY  2/01   R arm  . WRIST FRACTURE SURGERY Left 02/12/2018     OB History   No obstetric history on file.     Family History  Problem Relation Age of Onset  . Heart attack Father   . Hypertension Father   . Heart attack Mother   . Throat cancer Brother   . Breast cancer Daughter     Social History   Tobacco Use  . Smoking status: Never Smoker  . Smokeless tobacco: Never Used  Substance Use Topics  . Alcohol use: No    Alcohol/week: 0.0 standard drinks  . Drug use: No    Home Medications Prior to Admission medications   Medication Sig Start Date End Date Taking? Authorizing Provider  Chlorpheniramine Maleate (ALLERGY PO) Take 1 tablet by mouth daily.     [provider]  donepezil (ARICEPT) 10 MG tablet Take 1 tablet (10 mg total) by mouth at bedtime. 08/02/19   Melvenia Beam, MD  iron polysaccharides (NIFEREX) 150 MG capsule Take 1 capsule (150 mg total) by mouth 2 (two) times daily. 10/15/19   Tower, Wynelle Fanny, MD  Melatonin 10 MG TABS Take 10 mg by mouth at bedtime.    [provider]  memantine (NAMENDA) 10 MG tablet Take 1 tablet (10 mg total) by mouth 2 (two) times daily. 08/02/19   Melvenia Beam, MD  Multiple Vitamins-Minerals ([facility] SPECTRAVITE PO) Take 1 capsule by mouth daily.    [provider]  polyethylene glycol (MIRALAX / GLYCOLAX) 17 g packet Take 17 g by mouth daily. 08/09/19   Loletha Grayer, MD  potassium chloride (KLOR-CON M10) 10 MEQ tablet Take 2 tablets (20 mEq total) by mouth every evening. 08/09/19   Loletha Grayer, MD  Probiotic Product (PROBIOTIC PO) Take 1 tablet by mouth every other day.     [provider]  vitamin B-12 (CYANOCOBALAMIN) 1000 MCG tablet Take 1,000 mcg by mouth daily.    [provider]    Allergies    Alendronate sodium, Amlodipine besylate, Calcitonin (salmon), Plavix [clopidogrel bisulfate], and Simvastatin  Review of Systems   Review of Systems  All other systems reviewed and are negative.   Physical Exam Updated Vital Signs BP (!) 163/85   Pulse 84   Temp 98.9 F (37.2 C) (Oral)   Resp 16   SpO2 93%   Physical Exam Vitals and nursing note reviewed.  Constitutional:      Appearance: She is  well-developed.  HENT:     Head: Normocephalic and atraumatic.  Cardiovascular:     Rate and Rhythm: Normal rate and regular rhythm.     Heart sounds: No murmur.  Pulmonary:     Effort: Pulmonary effort is normal. No respiratory distress.     Breath sounds: Normal breath sounds.  Abdominal:     Palpations: Abdomen is soft.     Tenderness: There is no abdominal tenderness. There is no guarding or rebound.  Musculoskeletal:        General: Tenderness present.     Comments: 2+ DP pulses bilaterally.  TTP over right hip.    Skin:    General: Skin is warm and dry.  Neurological:     Mental Status: She is alert.     Comments: Oriented to self.  MAE  Psychiatric:     Comments: Anxious appearing     ED Results / Procedures / Treatments   Labs (all labs ordered are listed, but only abnormal results are displayed) Labs Reviewed  COMPREHENSIVE METABOLIC PANEL - Abnormal; Notable for the following components:      Result Value   Albumin 3.3 (*)    GFR calc non Af Amer 59 (*)    All other components within normal limits  CBC WITH DIFFERENTIAL/PLATELET - Abnormal; Notable for the following components:   MCHC 29.6 (*)    RDW 17.3 (*)    All other components within normal limits  SARS CORONAVIRUS 2 BY RT PCR (HOSPITAL ORDER, [place] [facility])  PROTIME-INR  URINALYSIS, ROUTINE W REFLEX MICROSCOPIC    EKG EKG  Interpretation  Date/Time:  Tuesday December 07 2019 18:22:06 EDT Ventricular Rate:  80 PR Interval:    QRS Duration: 125 QT Interval:  404 QTC Calculation: 466 R Axis:     Text Interpretation: Sinus rhythm Left bundle branch block Confirmed by Quintella Reichert [phone]) on 12/07/2019 6:27:21 PM   Radiology DG Chest Port 1 View  Result Date: 12/07/2019 CLINICAL DATA:  [place] 4 days ago, hip fracture, preoperative evaluation EXAM: PORTABLE CHEST 1 VIEW COMPARISON:  07/05/2017 FINDINGS: Single frontal view of the chest demonstrates a stable cardiac silhouette. Hiatal hernia again noted. No airspace disease, effusion, or pneumothorax. No acute bony abnormalities. IMPRESSION: 1. Stable hiatal hernia.  No acute intrathoracic process. Electronically Signed   By: Randa Ngo M.D.   On: 12/07/2019 18:28    Procedures Procedures (including critical care time)  Medications Ordered in ED Medications  HYDROcodone-acetaminophen (NORCO/VICODIN) 5-325 MG per tablet 1 tablet (1 tablet Oral Given 12/07/19 1848)  ondansetron (ZOFRAN) injection 4 mg (4 mg Intravenous Given 12/07/19 1848)    ED Course  I have reviewed the triage vital signs and the nursing notes.  Pertinent labs & imaging results that were available during my care of the patient were reviewed by me and considered in my medical decision making (see chart for details).    MDM Rules/Calculators/A&P                     Pt referred to the ED for hip fracture.  She has local tenderness to right hip.  Unable to view images that were obtained prior to ED arrival.  Discussed with Dr. Maureen Ralphs - Dr. Lyla Glassing plans to do surgery tomorrow, no additional images needed at this time.  Hospitalist consulted for admission.   Final Clinical Impression(s) / ED Diagnoses Final diagnoses:  Closed fracture of right hip, initial encounter ([place])  Rx / DC Orders ED Discharge Orders    None       Quintella Reichert, MD 12/07/19 1942

## 2019-12-07 NOTE — H&P (Signed)
History and Physical    Wendy Krueger KXF:[id number] DOB: [birth year] DOA: 12/07/2019  PCP: Abner Greenspan, MD  Patient coming from: Home.  History obtained from patient's daughter.  Patient has dementia.  Chief Complaint: Right hip pain after fall.  HPI: Wendy Krueger is a 86 y.o. female with history of advanced dementia, hypertension, recently diagnosed DVT in January of this year status post IVC filter placement since patient cannot take anticoagulation secondary to severe bleeding from hemorrhoids and recently diagnosed COVID-19 infection last month was on prednisone was brought to the ER after patient was diagnosed with right hip fracture at the orthopedics office.  As per the daughter patient had a fall on Saturday 4 days ago when patient slipped and fell while walking with her walker.  Did not hit her head or lose consciousness was witnessed fall.  Since then patient has been having pain in the right hip.  Patient was taken to the orthopedics office today and x-rays revealed right hip fracture and was transferred to [facility], ER.  ED Course: In the ER patient had labs done which did not show any acute Covid test is pending however note that patient had Covid last month patient had Covid last month.  EKG shows normal sinus rhythm with LBBB chest x-ray unremarkable.  Patient admitted for further management of right hip fracture.  Review of Systems: As per HPI, rest all negative.   Past Medical History:  Diagnosis Date  . Asymptomatic gallstones   . BRBPR (bright red blood per rectum)   . Cataract 04/13/2018   bilateral eyes  . Cholelithiasis   . Clot    RLE blood clots; s/p IVC filter  . Colitis - presumed infectious origin    one ER visit   . Dementia ([place])   . Duodenitis   . Fall   . Gastritis   . GI bleed   . Hemorrhoid    bleeding  . Hiatal hernia   . Hyperlipidemia   . Hypertension   . Osteoarthritis   . Osteoporosis   . Stroke ([place])   . Syncope      Past Surgical History:  Procedure Laterality Date  . ABDOMINAL HYSTERECTOMY     BSO- fibroids  . ABI's     normal  . COLONOSCOPY  08/09/2019   Procedure: COLONOSCOPY;  Surgeon: Lucilla Lame, MD;  Location: [facility] ENDOSCOPY;  Service: Endoscopy;;  . dexa  1/05   osteoporosis  . ESOPHAGOGASTRODUODENOSCOPY N/A 03/21/2014   Procedure: ESOPHAGOGASTRODUODENOSCOPY (EGD);  Surgeon: Milus Banister, MD;  Location: [place];  Service: Endoscopy;  Laterality: N/A;  . FLEXIBLE SIGMOIDOSCOPY N/A 08/08/2019   Procedure: FLEXIBLE SIGMOIDOSCOPY;  Surgeon: Jonathon Bellows, MD;  Location: [facility] ENDOSCOPY;  Service: Gastroenterology;  Laterality: N/A;  . FLEXIBLE SIGMOIDOSCOPY N/A 08/09/2019   Procedure: FLEXIBLE SIGMOIDOSCOPY;  Surgeon: Lucilla Lame, MD;  Location: [facility] ENDOSCOPY;  Service: Endoscopy;  Laterality: N/A;  . IR IVC FILTER PLMT / S&I /IMG GUID/MOD SED  07/06/2019  . IVC FILTER INSERTION  07/2019  . left foot brace    . WRIST FRACTURE SURGERY  2/01   R arm  . WRIST FRACTURE SURGERY Left 02/12/2018     reports that she has never smoked. She has never used smokeless tobacco. She reports that she does not drink alcohol or use drugs.  Allergies  Allergen Reactions  . Alendronate Sodium Other (See Comments)    Leg pain   . Amlodipine Besylate Hives  . Calcitonin Dani Gobble) Other (  See Comments)     headache/ head pressure  . Plavix [Clopidogrel Bisulfate] Other (See Comments)    drowsy    . Simvastatin Other (See Comments)    Leg pain     Family History  Problem Relation Age of Onset  . Heart attack Father   . Hypertension Father   . Heart attack Mother   . Throat cancer Brother   . Breast cancer Daughter     Prior to Admission medications   Medication Sig Start Date End Date Taking? Authorizing Provider  Chlorpheniramine Maleate (ALLERGY PO) Take 1 tablet by mouth daily.     [provider]  donepezil (ARICEPT) 10 MG tablet Take 1 tablet (10 mg total) by mouth at bedtime.  08/02/19   Melvenia Beam, MD  iron polysaccharides (NIFEREX) 150 MG capsule Take 1 capsule (150 mg total) by mouth 2 (two) times daily. 10/15/19   Tower, Wynelle Fanny, MD  Melatonin 10 MG TABS Take 10 mg by mouth at bedtime.    [provider]  memantine (NAMENDA) 10 MG tablet Take 1 tablet (10 mg total) by mouth 2 (two) times daily. 08/02/19   Melvenia Beam, MD  Multiple Vitamins-Minerals ([facility] SPECTRAVITE PO) Take 1 capsule by mouth daily.    [provider]  polyethylene glycol (MIRALAX / GLYCOLAX) 17 g packet Take 17 g by mouth daily. 08/09/19   Loletha Grayer, MD  potassium chloride (KLOR-CON M10) 10 MEQ tablet Take 2 tablets (20 mEq total) by mouth every evening. 08/09/19   Loletha Grayer, MD  Probiotic Product (PROBIOTIC PO) Take 1 tablet by mouth every other day.     [provider]  vitamin B-12 (CYANOCOBALAMIN) 1000 MCG tablet Take 1,000 mcg by mouth daily.    [provider]    Physical Exam: Constitutional: Moderately built and nourished. Vitals:   12/07/19 1830 12/07/19 1845 12/07/19 1900 12/07/19 1930  BP: (!) 176/93 (!) 176/93 (!) 163/85 (!) 159/86  Pulse: 79 79 84 79  Resp: 17 20 16 15   Temp:      TempSrc:      SpO2: 98% 95% 93% 95%   Eyes: Anicteric no pallor.  ENMT: No discharge from the ears eyes nose or mouth. Neck: No mass felt.  No neck rigidity. Respiratory: No rhonchi or crepitations. Cardiovascular: S1-S2 heard. Abdomen: Soft nontender bowel sounds present. Musculoskeletal: Pain on my right hip. Skin: No rash. Neurologic: Alert awake oriented to her name.  Moves all extremities. Psychiatric: Oriented to her name.   Labs on Admission: I have personally reviewed following labs and imaging studies  CBC: Recent Labs  Lab 12/07/19 1830  WBC 5.7  NEUTROABS 4.0  HGB 12.5  HCT 42.3  MCV 93.2  PLT 027   Basic Metabolic Panel: Recent Labs  Lab 12/07/19 1830  NA 140  K 4.3  CL 106  CO2 25  GLUCOSE 99  BUN 21   CREATININE 0.88  CALCIUM 9.0   GFR: CrCl cannot be calculated (Unknown ideal weight.). Liver Function Tests: Recent Labs  Lab 12/07/19 1830  AST 41  ALT 22  ALKPHOS 123  BILITOT 0.6  PROT 6.8  ALBUMIN 3.3*   No results for input(s): LIPASE, AMYLASE in the last 168 hours. No results for input(s): AMMONIA in the last 168 hours. Coagulation Profile: Recent Labs  Lab 12/07/19 1830  INR 0.9   Cardiac Enzymes: No results for input(s): CKTOTAL, CKMB, CKMBINDEX, TROPONINI in the last 168 hours. BNP (last 3 results) No  results for input(s): PROBNP in the last 8760 hours. HbA1C: No results for input(s): HGBA1C in the last 72 hours. CBG: No results for input(s): GLUCAP in the last 168 hours. Lipid Profile: No results for input(s): CHOL, HDL, LDLCALC, TRIG, CHOLHDL, LDLDIRECT in the last 72 hours. Thyroid Function Tests: No results for input(s): TSH, T4TOTAL, FREET4, T3FREE, THYROIDAB in the last 72 hours. Anemia Panel: No results for input(s): VITAMINB12, FOLATE, FERRITIN, TIBC, IRON, RETICCTPCT in the last 72 hours. Urine analysis:    Component Value Date/Time   COLORURINE YELLOW 07/07/2019 1033   APPEARANCEUR CLEAR 07/07/2019 1033   LABSPEC 1.020 07/07/2019 1033   PHURINE 5.0 07/07/2019 1033   GLUCOSEU NEGATIVE 07/07/2019 1033   HGBUR NEGATIVE 07/07/2019 1033   BILIRUBINUR NEGATIVE 07/07/2019 1033   BILIRUBINUR Negative 02/18/2019 1658   [place] 07/07/2019 1033   PROTEINUR NEGATIVE 07/07/2019 1033   UROBILINOGEN 1.0 02/18/2019 1658   UROBILINOGEN 0.2 03/20/2014 0753   NITRITE NEGATIVE 07/07/2019 1033   LEUKOCYTESUR MODERATE (A) 07/07/2019 1033   Sepsis Labs: @LABRCNTIP (procalcitonin:4,lacticidven:4) )No results found for this or any previous visit (from the past 240 hour(s)).   Radiological Exams on Admission: DG Chest Port 1 View  Result Date: 12/07/2019 CLINICAL DATA:  [place] 4 days ago, hip fracture, preoperative evaluation EXAM: PORTABLE CHEST 1  VIEW COMPARISON:  07/05/2017 FINDINGS: Single frontal view of the chest demonstrates a stable cardiac silhouette. Hiatal hernia again noted. No airspace disease, effusion, or pneumothorax. No acute bony abnormalities. IMPRESSION: 1. Stable hiatal hernia.  No acute intrathoracic process. Electronically Signed   By: Randa Ngo M.D.   On: 12/07/2019 18:28    EKG: Independently reviewed.  Normal sinus rhythm LBBB.  Assessment/Plan Principal Problem:   Closed right hip fracture (HCC) Active Problems:   Essential hypertension   Dementia (HCC)    1. Closed right hip fracture status post mechanical fall.  Orthopedic is planning surgery in the morning.  Dr. Lyla Glassing will be doing the surgery.  Patient's daughter aware.  Will be kept n.p.o. except medications past midnight.  Pain medication physical therapy and social work consult. 2. Hypertension recently restarted back on metoprolol.  Keep patient on as needed IV hydralazine. 3. Dementia on Aricept and Namenda. 4. Chronic iron deficient anemia on iron supplements. 5. History of DVT status post IVC filter placement in January of this year.  As per the patient's daughter patient cannot take anticoagulation secondary to severe bleeding from hemorrhoids. 6. History of severe bleeding from hemorrhoids. 42. Was treated for COVID-19 infection last month. 8. History of brain aneurysm no acute issues.  COVID-19 test is pending however patient was treated for COVID-19 infection last month.  Since patient has hip fracture will need more than 2 midnight stay in inpatient status.   DVT prophylaxis: In anticipation of surgery will avoid pharmacological DVT prophylaxis and keep patient on SCDs. Code Status: DNR confirmed with patient's daughter. Family Communication: Patient's daughter. Disposition Plan: To be determined. Consults called: Orthopedics. Admission status: Inpatient.   Rise Patience MD [facility] Pager [phone].  If  7PM-7AM, please contact night-coverage [email or web address] Password [facility]  12/07/2019, 8:13 PM

## 2019-12-07 NOTE — Progress Notes (Signed)
Patient admitted with right femoral neck fx. R hip and knee xrays ordered. CT R hip ordered. Has h/o BLE extensive DVT with IVC filter placement. BLE venous duplex ordered. Plan for surgery tomorrow. Hold chemical DVT ppx, NPO after MN.

## 2019-12-08 ENCOUNTER — Inpatient Hospital Stay (HOSPITAL_COMMUNITY): Payer: Medicare Other | Admitting: Anesthesiology

## 2019-12-08 ENCOUNTER — Encounter (HOSPITAL_COMMUNITY): Payer: Self-pay | Admitting: Internal Medicine

## 2019-12-08 ENCOUNTER — Inpatient Hospital Stay (HOSPITAL_COMMUNITY): Payer: Medicare Other

## 2019-12-08 ENCOUNTER — Encounter (HOSPITAL_COMMUNITY)
Admission: EM | Disposition: A | Discharge status: Home Health | Payer: Self-pay | Source: Ambulatory Visit | Attending: Family Medicine

## 2019-12-08 DIAGNOSIS — I82409 Acute embolism and thrombosis of unspecified deep veins of unspecified lower extremity: Secondary | ICD-10-CM

## 2019-12-08 HISTORY — PX: ANTERIOR APPROACH HEMI HIP ARTHROPLASTY: SHX6690

## 2019-12-08 LAB — ABO/RH: ABO/RH(D): O POS

## 2019-12-08 LAB — URINALYSIS, ROUTINE W REFLEX MICROSCOPIC
Bilirubin Urine: NEGATIVE
Glucose, UA: NEGATIVE mg/dL
Hgb urine dipstick: NEGATIVE
Ketones, ur: NEGATIVE mg/dL
Nitrite: POSITIVE — AB
Protein, ur: NEGATIVE mg/dL
Specific Gravity, Urine: 1.026 (ref 1.005–1.030)
pH: 5 (ref 5.0–8.0)

## 2019-12-08 LAB — BASIC METABOLIC PANEL
Anion gap: 9 (ref 5–15)
BUN: 22 mg/dL (ref 8–23)
CO2: 24 mmol/L (ref 22–32)
Calcium: 8.8 mg/dL — ABNORMAL LOW (ref 8.9–10.3)
Chloride: 107 mmol/L (ref 98–111)
Creatinine, Ser: 1.01 mg/dL — ABNORMAL HIGH (ref 0.44–1.00)
GFR calc Af Amer: 58 mL/min — ABNORMAL LOW (ref >60)
GFR calc non Af Amer: 50 mL/min — ABNORMAL LOW (ref >60)
Glucose, Bld: 87 mg/dL (ref 70–99)
Potassium: 4 mmol/L (ref 3.5–5.1)
Sodium: 140 mmol/L (ref 135–145)

## 2019-12-08 LAB — CBC
HCT: 39.1 % (ref 36.0–46.0)
HCT: 38.3 % (ref 36.0–46.0)
Hemoglobin: 11.8 g/dL — ABNORMAL LOW (ref 12.0–15.0)
Hemoglobin: 11.5 g/dL — ABNORMAL LOW (ref 12.0–15.0)
MCH: 28 pg (ref 26.0–34.0)
MCH: 28.4 pg (ref 26.0–34.0)
MCHC: 30.2 g/dL (ref 30.0–36.0)
MCHC: 30 g/dL (ref 30.0–36.0)
MCV: 92.9 fL (ref 80.0–100.0)
MCV: 94.6 fL (ref 80.0–100.0)
Platelets: 137 10*3/uL — ABNORMAL LOW (ref 150–400)
Platelets: 135 10*3/uL — ABNORMAL LOW (ref 150–400)
RBC: 4.21 MIL/uL (ref 3.87–5.11)
RBC: 4.05 MIL/uL (ref 3.87–5.11)
RDW: 17.4 % — ABNORMAL HIGH (ref 11.5–15.5)
RDW: 17.2 % — ABNORMAL HIGH (ref 11.5–15.5)
WBC: 4.7 10*3/uL (ref 4.0–10.5)
WBC: 6.2 10*3/uL (ref 4.0–10.5)
nRBC: 0 % (ref 0.0–0.2)
nRBC: 0 % (ref 0.0–0.2)

## 2019-12-08 LAB — TYPE AND SCREEN
ABO/RH(D): O POS
Antibody Screen: NEGATIVE

## 2019-12-08 LAB — CREATININE, SERUM
Creatinine, Ser: 0.99 mg/dL (ref 0.44–1.00)
GFR calc Af Amer: 59 mL/min — ABNORMAL LOW (ref >60)
GFR calc non Af Amer: 51 mL/min — ABNORMAL LOW (ref >60)

## 2019-12-08 LAB — MRSA PCR SCREENING: MRSA by PCR: NEGATIVE

## 2019-12-08 SURGERY — HEMIARTHROPLASTY, HIP, DIRECT ANTERIOR APPROACH, FOR FRACTURE
Anesthesia: General | Laterality: Right

## 2019-12-08 MED ORDER — LIDOCAINE 2% (20 MG/ML) 5 ML SYRINGE
INTRAMUSCULAR | Status: DC | PRN
  Administered 2019-12-08: 100 mg via INTRAVENOUS

## 2019-12-08 MED ORDER — SODIUM CHLORIDE (PF) 0.9 % IJ SOLN
INTRAMUSCULAR | Status: DC | PRN
  Administered 2019-12-08: 30 mL

## 2019-12-08 MED ORDER — METOCLOPRAMIDE HCL 5 MG/ML IJ SOLN: 5.0000 mg | Freq: Three times a day (TID) | INTRAMUSCULAR | Status: DC | PRN

## 2019-12-08 MED ORDER — ROCURONIUM BROMIDE 10 MG/ML (PF) SYRINGE
PREFILLED_SYRINGE | INTRAVENOUS | Status: AC
  Filled 2019-12-08: qty 10

## 2019-12-08 MED ORDER — SODIUM CHLORIDE 0.9 % IR SOLN
Status: DC | PRN
  Administered 2019-12-08: 1000 mL

## 2019-12-08 MED ORDER — FENTANYL CITRATE (PF) 100 MCG/2ML IJ SOLN
INTRAMUSCULAR | Status: AC
  Filled 2019-12-08: qty 2

## 2019-12-08 MED ORDER — 0.9 % SODIUM CHLORIDE (POUR BTL) OPTIME
TOPICAL | Status: DC | PRN
  Administered 2019-12-08: 1000 mL

## 2019-12-08 MED ORDER — PHENYLEPHRINE 40 MCG/ML (10ML) SYRINGE FOR IV PUSH (FOR BLOOD PRESSURE SUPPORT)
PREFILLED_SYRINGE | INTRAVENOUS | Status: DC | PRN
  Administered 2019-12-08: 120 ug via INTRAVENOUS

## 2019-12-08 MED ORDER — CEFAZOLIN SODIUM-DEXTROSE 2-4 GM/100ML-% IV SOLN
2.0000 g | Freq: Four times a day (QID) | INTRAVENOUS | Status: AC
  Administered 2019-12-08 – 2019-12-09 (×2): 2 g via INTRAVENOUS
  Filled 2019-12-08 (×2): qty 100

## 2019-12-08 MED ORDER — KETOROLAC TROMETHAMINE 30 MG/ML IJ SOLN
INTRAMUSCULAR | Status: DC | PRN
  Administered 2019-12-08: 30 mg via INTRA_ARTICULAR

## 2019-12-08 MED ORDER — SUGAMMADEX SODIUM 200 MG/2ML IV SOLN
INTRAVENOUS | Status: DC | PRN
  Administered 2019-12-08: 200 mg via INTRAVENOUS

## 2019-12-08 MED ORDER — DEXAMETHASONE SODIUM PHOSPHATE 10 MG/ML IJ SOLN
INTRAMUSCULAR | Status: AC
  Filled 2019-12-08: qty 1

## 2019-12-08 MED ORDER — ENSURE PRE-SURGERY PO LIQD
296.0000 mL | Freq: Once | ORAL | Status: DC
  Filled 2019-12-08: qty 296

## 2019-12-08 MED ORDER — EPHEDRINE 5 MG/ML INJ
INTRAVENOUS | Status: AC
  Filled 2019-12-08: qty 10

## 2019-12-08 MED ORDER — SODIUM CHLORIDE (PF) 0.9 % IJ SOLN
INTRAMUSCULAR | Status: AC
  Filled 2019-12-08: qty 50

## 2019-12-08 MED ORDER — LACTATED RINGERS IV SOLN: INTRAVENOUS | Status: DC

## 2019-12-08 MED ORDER — KETOROLAC TROMETHAMINE 30 MG/ML IJ SOLN
INTRAMUSCULAR | Status: AC
  Filled 2019-12-08: qty 1

## 2019-12-08 MED ORDER — ONDANSETRON HCL 4 MG/2ML IJ SOLN
INTRAMUSCULAR | Status: AC
  Filled 2019-12-08: qty 2

## 2019-12-08 MED ORDER — POVIDONE-IODINE 10 % EX SWAB: 2.0000 "application " | Freq: Once | CUTANEOUS | Status: DC

## 2019-12-08 MED ORDER — PHENOL 1.4 % MT LIQD: 1.0000 | OROMUCOSAL | Status: DC | PRN

## 2019-12-08 MED ORDER — CEFAZOLIN SODIUM-DEXTROSE 2-4 GM/100ML-% IV SOLN
2.0000 g | INTRAVENOUS | Status: AC
  Administered 2019-12-08: 2 g via INTRAVENOUS
  Filled 2019-12-08: qty 100

## 2019-12-08 MED ORDER — METOCLOPRAMIDE HCL 5 MG PO TABS: 5.0000 mg | ORAL_TABLET | Freq: Three times a day (TID) | ORAL | Status: DC | PRN

## 2019-12-08 MED ORDER — ACETAMINOPHEN 325 MG PO TABS
325.0000 mg | ORAL_TABLET | Freq: Four times a day (QID) | ORAL | Status: DC | PRN
  Administered 2019-12-10: 650 mg via ORAL
  Filled 2019-12-08: qty 2

## 2019-12-08 MED ORDER — BUPIVACAINE HCL (PF) 0.25 % IJ SOLN
INTRAMUSCULAR | Status: DC | PRN
  Administered 2019-12-08: 30 mL via INTRA_ARTICULAR

## 2019-12-08 MED ORDER — PROPOFOL 10 MG/ML IV BOLUS
INTRAVENOUS | Status: AC
  Filled 2019-12-08: qty 20

## 2019-12-08 MED ORDER — MENTHOL 3 MG MT LOZG: 1.0000 | LOZENGE | OROMUCOSAL | Status: DC | PRN

## 2019-12-08 MED ORDER — PROPOFOL 10 MG/ML IV BOLUS
INTRAVENOUS | Status: DC | PRN
  Administered 2019-12-08: 70 mg via INTRAVENOUS

## 2019-12-08 MED ORDER — HYDROCODONE-ACETAMINOPHEN 5-325 MG PO TABS
1.0000 | ORAL_TABLET | ORAL | Status: DC | PRN
  Administered 2019-12-08: 1 via ORAL
  Administered 2019-12-09 (×2): 2 via ORAL
  Administered 2019-12-09: 1 via ORAL
  Filled 2019-12-08: qty 2
  Filled 2019-12-08: qty 1
  Filled 2019-12-08: qty 2
  Filled 2019-12-08: qty 1

## 2019-12-08 MED ORDER — ONDANSETRON HCL 4 MG/2ML IJ SOLN: 4.0000 mg | Freq: Four times a day (QID) | INTRAMUSCULAR | Status: DC | PRN

## 2019-12-08 MED ORDER — ISOPROPYL ALCOHOL 70 % SOLN
Status: AC
  Filled 2019-12-08: qty 480

## 2019-12-08 MED ORDER — ROCURONIUM BROMIDE 10 MG/ML (PF) SYRINGE
PREFILLED_SYRINGE | INTRAVENOUS | Status: DC | PRN
  Administered 2019-12-08: 50 mg via INTRAVENOUS

## 2019-12-08 MED ORDER — LABETALOL HCL 5 MG/ML IV SOLN
INTRAVENOUS | Status: DC | PRN
Start: 2019-12-08 — End: 2019-12-08
  Administered 2019-12-08 (×2): 2.5 mg via INTRAVENOUS

## 2019-12-08 MED ORDER — CHLORHEXIDINE GLUCONATE 4 % EX LIQD
60.0000 mL | Freq: Once | CUTANEOUS | Status: AC
  Administered 2019-12-08: 4 via TOPICAL
  Filled 2019-12-08: qty 60

## 2019-12-08 MED ORDER — LIDOCAINE 2% (20 MG/ML) 5 ML SYRINGE
INTRAMUSCULAR | Status: AC
  Filled 2019-12-08: qty 5

## 2019-12-08 MED ORDER — ENOXAPARIN SODIUM 40 MG/0.4ML ~~LOC~~ SOLN
40.0000 mg | SUBCUTANEOUS | Status: DC
  Administered 2019-12-09 – 2019-12-10 (×2): 40 mg via SUBCUTANEOUS
  Filled 2019-12-08 (×2): qty 0.4

## 2019-12-08 MED ORDER — HYDROCODONE-ACETAMINOPHEN 7.5-325 MG PO TABS: 1.0000 | ORAL_TABLET | ORAL | Status: DC | PRN

## 2019-12-08 MED ORDER — BOOST / RESOURCE BREEZE PO LIQD CUSTOM
1.0000 | ORAL | Status: DC
  Administered 2019-12-09 – 2019-12-10 (×2): 1 via ORAL

## 2019-12-08 MED ORDER — ENSURE ENLIVE PO LIQD
237.0000 mL | Freq: Two times a day (BID) | ORAL | Status: DC
  Administered 2019-12-08 – 2019-12-09 (×3): 237 mL via ORAL

## 2019-12-08 MED ORDER — FENTANYL CITRATE (PF) 100 MCG/2ML IJ SOLN: 25.0000 ug | INTRAMUSCULAR | Status: DC | PRN

## 2019-12-08 MED ORDER — PHENYLEPHRINE HCL (PRESSORS) 10 MG/ML IV SOLN
INTRAVENOUS | Status: AC
  Filled 2019-12-08: qty 1

## 2019-12-08 MED ORDER — ONDANSETRON HCL 4 MG PO TABS: 4.0000 mg | ORAL_TABLET | Freq: Four times a day (QID) | ORAL | Status: DC | PRN

## 2019-12-08 MED ORDER — ADULT MULTIVITAMIN W/MINERALS CH
1.0000 | ORAL_TABLET | Freq: Every day | ORAL | Status: DC
  Administered 2019-12-09 – 2019-12-10 (×2): 1 via ORAL
  Filled 2019-12-08 (×2): qty 1

## 2019-12-08 MED ORDER — TRANEXAMIC ACID-NACL 1000-0.7 MG/100ML-% IV SOLN
1000.0000 mg | INTRAVENOUS | Status: AC
  Administered 2019-12-08: 1000 mg via INTRAVENOUS
  Filled 2019-12-08: qty 100

## 2019-12-08 MED ORDER — DEXAMETHASONE SODIUM PHOSPHATE 10 MG/ML IJ SOLN
INTRAMUSCULAR | Status: DC | PRN
  Administered 2019-12-08: 8 mg via INTRAVENOUS

## 2019-12-08 MED ORDER — FENTANYL CITRATE (PF) 100 MCG/2ML IJ SOLN
INTRAMUSCULAR | Status: DC | PRN
  Administered 2019-12-08: 50 ug via INTRAVENOUS
  Administered 2019-12-08 (×2): 25 ug via INTRAVENOUS
  Administered 2019-12-08: 50 ug via INTRAVENOUS
  Administered 2019-12-08: 25 ug via INTRAVENOUS

## 2019-12-08 MED ORDER — MORPHINE SULFATE (PF) 2 MG/ML IV SOLN: 0.5000 mg | INTRAVENOUS | Status: DC | PRN

## 2019-12-08 MED ORDER — LABETALOL HCL 5 MG/ML IV SOLN
INTRAVENOUS | Status: AC
  Filled 2019-12-08: qty 4

## 2019-12-08 MED ORDER — ONDANSETRON HCL 4 MG/2ML IJ SOLN
INTRAMUSCULAR | Status: DC | PRN
  Administered 2019-12-08: 4 mg via INTRAVENOUS

## 2019-12-08 MED ORDER — DOCUSATE SODIUM 100 MG PO CAPS
100.0000 mg | ORAL_CAPSULE | Freq: Two times a day (BID) | ORAL | Status: DC
  Administered 2019-12-08 – 2019-12-10 (×4): 100 mg via ORAL
  Filled 2019-12-08 (×4): qty 1

## 2019-12-08 MED ORDER — EPHEDRINE SULFATE-NACL 50-0.9 MG/10ML-% IV SOSY
PREFILLED_SYRINGE | INTRAVENOUS | Status: DC | PRN
  Administered 2019-12-08 (×2): 10 mg via INTRAVENOUS
  Administered 2019-12-08: 20 mg via INTRAVENOUS

## 2019-12-08 MED ORDER — POVIDONE-IODINE 10 % EX SWAB
2.0000 "application " | Freq: Once | CUTANEOUS | Status: AC
  Administered 2019-12-08: 2 via TOPICAL

## 2019-12-08 MED ORDER — BUPIVACAINE HCL 0.25 % IJ SOLN
INTRAMUSCULAR | Status: AC
  Filled 2019-12-08: qty 1

## 2019-12-08 SURGICAL SUPPLY — 60 items
ADH SKN CLS APL DERMABOND .7 (GAUZE/BANDAGES/DRESSINGS) ×3
APL PRP STRL LF DISP 70% ISPRP (MISCELLANEOUS) ×1
BLADE CLIPPER SURG (BLADE) IMPLANT
CHLORAPREP W/TINT 26 (MISCELLANEOUS) ×3 IMPLANT
COVER SURGICAL LIGHT HANDLE (MISCELLANEOUS) ×3 IMPLANT
COVER WAND RF STERILE (DRAPES) ×1 IMPLANT
DERMABOND ADVANCED (GAUZE/BANDAGES/DRESSINGS) ×6
DERMABOND ADVANCED .7 DNX12 (GAUZE/BANDAGES/DRESSINGS) ×2 IMPLANT
DRAPE C-ARM 42X120 X-RAY (DRAPES) ×3 IMPLANT
DRAPE IMP U-DRAPE 54X76 (DRAPES) ×6 IMPLANT
DRAPE SHEET LG 3/4 BI-LAMINATE (DRAPES) ×6 IMPLANT
DRAPE STERI IOBAN 125X83 (DRAPES) ×3 IMPLANT
DRAPE TOP 10253 STERILE (DRAPES) ×3 IMPLANT
DRAPE U-SHAPE 47X51 STRL (DRAPES) ×9 IMPLANT
DRSG AQUACEL AG ADV 3.5X10 (GAUZE/BANDAGES/DRESSINGS) ×3 IMPLANT
ELECT NDL TIP 2.8 STRL (NEEDLE) ×1 IMPLANT
ELECT NEEDLE TIP 2.8 STRL (NEEDLE) ×3 IMPLANT
ELECT REM PT RETURN 15FT ADLT (MISCELLANEOUS) ×3 IMPLANT
EVACUATOR 1/8 PVC DRAIN (DRAIN) IMPLANT
GLOVE BIO SURGEON STRL SZ8.5 (GLOVE) ×6 IMPLANT
GLOVE BIOGEL PI IND STRL 8.5 (GLOVE) ×1 IMPLANT
GLOVE BIOGEL PI INDICATOR 8.5 (GLOVE) ×2
GOWN STRL REUS W/ TWL LRG LVL3 (GOWN DISPOSABLE) ×2 IMPLANT
GOWN STRL REUS W/TWL 2XL LVL3 (GOWN DISPOSABLE) ×3 IMPLANT
GOWN STRL REUS W/TWL LRG LVL3 (GOWN DISPOSABLE) ×12
HANDPIECE INTERPULSE COAX TIP (DISPOSABLE) ×3
HEAD FEM UNIPOLAR 45 OD STRL (Hips) ×2 IMPLANT
HOOD PEEL AWAY FLYTE STAYCOOL (MISCELLANEOUS) ×6 IMPLANT
KIT BASIN (CUSTOM PROCEDURE TRAY) ×3 IMPLANT
KIT TURNOVER KIT A (KITS) ×2 IMPLANT
MANIFOLD NEPTUNE II (INSTRUMENTS) ×3 IMPLANT
MARKER SKIN DUAL TIP RULER LAB (MISCELLANEOUS) ×3 IMPLANT
NDL SPNL 18GX3.5 QUINCKE PK (NEEDLE) ×1 IMPLANT
NEEDLE SPNL 18GX3.5 QUINCKE PK (NEEDLE) ×3 IMPLANT
NS IRRIG 1000ML POUR BTL (IV SOLUTION) ×3 IMPLANT
PACK ANTERIOR HIP CUSTOM (KITS) ×3 IMPLANT
PACK TOTAL JOINT (CUSTOM PROCEDURE TRAY) ×3 IMPLANT
PENCIL SMOKE EVACUATOR (MISCELLANEOUS) ×2 IMPLANT
SAW OSC TIP CART 19.5X105X1.3 (SAW) ×3 IMPLANT
SEALER BIPOLAR AQUA 6.0 (INSTRUMENTS) IMPLANT
SET HNDPC FAN SPRY TIP SCT (DISPOSABLE) ×1 IMPLANT
SPACER FEM TAPERED +0 12/14 (Hips) ×2 IMPLANT
STEM TRI LOC GRIPTION SZ 4 STD (Hips) IMPLANT
SUCTION FRAZIER HANDLE 10FR (MISCELLANEOUS) ×3
SUCTION TUBE FRAZIER 10FR DISP (MISCELLANEOUS) ×1 IMPLANT
SUT ETHIBOND NAB CT1 #1 30IN (SUTURE) ×6 IMPLANT
SUT MNCRL AB 3-0 PS2 18 (SUTURE) ×5 IMPLANT
SUT MON AB 2-0 CT1 36 (SUTURE) ×3 IMPLANT
SUT STRATAFIX PDO 1 14 VIOLET (SUTURE) ×3
SUT STRATFX PDO 1 14 VIOLET (SUTURE) ×1
SUT VIC AB 1 CT1 27 (SUTURE) ×3
SUT VIC AB 1 CT1 27XBRD ANTBC (SUTURE) ×1 IMPLANT
SUT VIC AB 2-0 CT1 27 (SUTURE) ×3
SUT VIC AB 2-0 CT1 TAPERPNT 27 (SUTURE) ×1 IMPLANT
SUTURE STRATFX PDO 1 14 VIOLET (SUTURE) ×1 IMPLANT
SYR 50ML LL SCALE MARK (SYRINGE) ×3 IMPLANT
TOWEL OR 17X26 10 PK STRL BLUE (TOWEL DISPOSABLE) ×3 IMPLANT
TRAY FOLEY MTR SLVR 16FR STAT (SET/KITS/TRAYS/PACK) ×2 IMPLANT
TRI LOC GRIPTION SZ 4 STD (Hips) ×3 IMPLANT
WATER STERILE IRR 1000ML POUR (IV SOLUTION) ×9 IMPLANT

## 2019-12-08 NOTE — Progress Notes (Signed)
Bilateral lower extremity venous duplex completed. Refer to "CV Proc" under chart review to view preliminary results.  12/08/2019 11:36 AM Kelby Aline., MHA, RVT, RDCS, RDMS

## 2019-12-08 NOTE — Transfer of Care (Signed)
Immediate Anesthesia Transfer of Care Note  Patient: Wendy Krueger  Procedure(s) Performed: ANTERIOR APPROACH HEMI HIP ARTHROPLASTY (Right )  Patient Location: PACU  Anesthesia Type:General  Level of Consciousness: awake  Airway & Oxygen Therapy: Patient Spontanous Breathing and Patient connected to face mask oxygen  Post-op Assessment: Report given to RN and Post -op Vital signs reviewed and stable  Post vital signs: Reviewed and stable  Last Vitals:  Vitals Value Taken Time  BP    Temp    Pulse 25 12/08/19 1705  Resp    SpO2 94 % 12/08/19 1705  Vitals shown include unvalidated device data.  Last Pain:  Vitals:   12/08/19 1424  TempSrc: Oral  PainSc:          Complications: No apparent anesthesia complications

## 2019-12-08 NOTE — Anesthesia Procedure Notes (Signed)
Procedure Name: Intubation Date/Time: 12/08/2019 3:11 PM Performed by: Niel Hummer, CRNA Pre-anesthesia Checklist: Patient identified, Emergency Drugs available, Suction available and Patient being monitored Patient Re-evaluated:Patient Re-evaluated prior to induction Oxygen Delivery Method: Circle system utilized Preoxygenation: Pre-oxygenation with 100% oxygen Induction Type: IV induction Ventilation: Two handed mask ventilation required Laryngoscope Size: Mac and 4 Grade View: Grade I Tube type: Oral Tube size: 7.0 mm Number of attempts: 1 Airway Equipment and Method: Stylet Placement Confirmation: ETT inserted through vocal cords under direct vision,  positive ETCO2 and breath sounds checked- equal and bilateral Secured at: 22 cm Tube secured with: Tape Dental Injury: Teeth and Oropharynx as per pre-operative assessment

## 2019-12-08 NOTE — Anesthesia Preprocedure Evaluation (Signed)
Anesthesia Evaluation  Patient identified by MRN, date of birth, ID band Patient awake    Reviewed: Allergy & Precautions, NPO status , Patient's Chart, lab work & pertinent test results  Airway Mallampati: II  TM Distance: >3 FB Neck ROM: Full    Dental no notable dental hx.    Pulmonary neg pulmonary ROS,    Pulmonary exam normal breath sounds clear to auscultation       Cardiovascular hypertension, + DVT  Normal cardiovascular exam Rhythm:Regular Rate:Normal  S/P IVC filter    Neuro/Psych Dementia CVA    GI/Hepatic Neg liver ROS, hiatal hernia,   Endo/Other  negative endocrine ROS  Renal/GU negative Renal ROS  negative genitourinary   Musculoskeletal negative musculoskeletal ROS (+)   Abdominal   Peds negative pediatric ROS (+)  Hematology negative hematology ROS (+)   Anesthesia Other Findings   Reproductive/Obstetrics negative OB ROS                             Anesthesia Physical Anesthesia Plan  ASA: III  Anesthesia Plan: General   Post-op Pain Management:    Induction: Intravenous  PONV Risk Score and Plan: 3 and Ondansetron, Dexamethasone and Treatment may vary due to age or medical condition  Airway Management Planned: Oral ETT  Additional Equipment:   Intra-op Plan:   Post-operative Plan: Extubation in OR  Informed Consent: I have reviewed the patients History and Physical, chart, labs and discussed the procedure including the risks, benefits and alternatives for the proposed anesthesia with the patient or authorized representative who has indicated his/her understanding and acceptance.     Dental advisory given  Plan Discussed with: CRNA and Surgeon  Anesthesia Plan Comments:         Anesthesia Quick Evaluation

## 2019-12-08 NOTE — Consult Note (Signed)
ORTHOPAEDIC CONSULTATION  REQUESTING PHYSICIAN: Patrecia Pour, MD  PCP:  Abner Greenspan, MD  Chief Complaint: Right femoral neck fracture  HPI: Wendy Krueger is a 86 y.o. female with past medical history of GI bleed, dementia, bilateral lower extremity DVT status post IVC filter placement, hypertension, hiatal hernia, hemorrhoids who had a witnessed ground-level fall this past weekend.  Since the fall, she has had right hip pain and inability to weight-bear.  She was brought to our office yesterday, and x-rays revealed right femoral neck fracture.  She was admitted to the hospitalist service for perioperative or stratification medical optimization.  CT scan of the hip was obtained, confirming comminuted impacted right femoral neck fracture with some retroversion of the head.  Repeat bilateral lower extremity Dopplers were performed today, confirming continued presence of bilateral lower extremity DVT with extensive clot burden.  Orthopedic consultation placed for management of hip fracture.  Past Medical History:  Diagnosis Date  . Asymptomatic gallstones   . BRBPR (bright red blood per rectum)   . Cataract 04/13/2018   bilateral eyes  . Cholelithiasis   . Clot    RLE blood clots; s/p IVC filter  . Colitis - presumed infectious origin    one ER visit   . Dementia ([place])   . Duodenitis   . Fall   . Gastritis   . GI bleed   . Hemorrhoid    bleeding  . Hiatal hernia   . Hyperlipidemia   . Hypertension   . Osteoarthritis   . Osteoporosis   . Stroke ([place])   . Syncope    Past Surgical History:  Procedure Laterality Date  . ABDOMINAL HYSTERECTOMY     BSO- fibroids  . ABI's     normal  . COLONOSCOPY  08/09/2019   Procedure: COLONOSCOPY;  Surgeon: Lucilla Lame, MD;  Location[facility] ENDOSCOPY;  Service: Endoscopy;;  . dexa  1/05   osteoporosis  . ESOPHAGOGASTRODUODENOSCOPY N/A 03/21/2014   Procedure: ESOPHAGOGASTRODUODENOSCOPY (EGD);  Surgeon: Milus Banister, MD;   Location: [facility] [place];  Service: Endoscopy;  Laterality: N/A;  . FLEXIBLE SIGMOIDOSCOPY N/A 08/08/2019   Procedure: FLEXIBLE SIGMOIDOSCOPY;  Surgeon: Jonathon Bellows, MD;  Location: [facility] ENDOSCOPY;  Service: Gastroenterology;  Laterality: N/A;  . FLEXIBLE SIGMOIDOSCOPY N/A 08/09/2019   Procedure: FLEXIBLE SIGMOIDOSCOPY;  Surgeon: Lucilla Lame, MD;  Location: [facility] ENDOSCOPY;  Service: Endoscopy;  Laterality: N/A;  . IR IVC FILTER PLMT / S&I /IMG GUID/MOD SED  07/06/2019  . IVC FILTER INSERTION  07/2019  . left foot brace    . WRIST FRACTURE SURGERY  2/01   R arm  . WRIST FRACTURE SURGERY Left 02/12/2018   Social History   Socioeconomic History  . Marital status: Widowed    Spouse name: Not on file  . Number of children: 5  . Years of education: 27  . Highest education level: Not on file  Occupational History  . Occupation: retired    Fish farm manager: RETIRED  Tobacco Use  . Smoking status: Never Smoker  . Smokeless tobacco: Never Used  Substance and Sexual Activity  . Alcohol use: No    Alcohol/week: 0.0 standard drinks  . Drug use: No  . Sexual activity: Never  Other Topics Concern  . Not on file  Social History Narrative   Has 4 brothers, 1 sister   Lives with 1 adult grandchild and her daughter [name]   Has 4 daughters, 1 son   Right handed   Rare caffeine  Social Determinants of Health   Financial Resource Strain: Low Risk   . Difficulty of Paying Living Expenses: Not hard at all  Food Insecurity: No Food Insecurity  . Worried About Charity fundraiser in the Last Year: Never true  . Ran Out of Food in the Last Year: Never true  Transportation Needs: No Transportation Needs  . Lack of Transportation (Medical): No  . Lack of Transportation (Non-Medical): No  Physical Activity: Inactive  . Days of Exercise per Week: 0 days  . Minutes of Exercise per Session: 0 min  Stress: No Stress Concern Present  . Feeling of Stress : Not at all  Social Connections: Moderately Isolated    . Frequency of Communication with Friends and Family: More than three times a week  . Frequency of Social Gatherings with Friends and Family: More than three times a week  . Attends Religious Services: Never  . Active Member of Clubs or Organizations: No  . Attends Archivist Meetings: Never  . Marital Status: Widowed   Family History  Problem Relation Age of Onset  . Heart attack Father   . Hypertension Father   . Heart attack Mother   . Throat cancer Brother   . Breast cancer Daughter    Allergies  Allergen Reactions  . Alendronate Sodium Other (See Comments)    Leg pain   . Amlodipine Besylate Hives  . Calcitonin ([place]) Other (See Comments)     headache/ head pressure  . Plavix [Clopidogrel Bisulfate] Other (See Comments)    drowsy    . Simvastatin Other (See Comments)    Leg pain    Prior to Admission medications   Medication Sig Start Date End Date Taking? Authorizing Provider  acetaminophen (TYLENOL) 500 MG tablet Take 500 mg by mouth every 6 (six) hours as needed.   Yes [provider]  Ascorbic Acid (VITAMIN C) 1000 MG tablet Take 1,000 mg by mouth daily.   Yes [provider]  Chlorpheniramine Maleate (ALLERGY PO) Take 1 tablet by mouth daily.    Yes [provider]  donepezil (ARICEPT) 10 MG tablet Take 1 tablet (10 mg total) by mouth at bedtime. 08/02/19  Yes Melvenia Beam, MD  iron polysaccharides (NIFEREX) 150 MG capsule Take 1 capsule (150 mg total) by mouth 2 (two) times daily. 10/15/19  Yes Tower, Wynelle Fanny, MD  Melatonin 10 MG TABS Take 10 mg by mouth at bedtime.   Yes [provider]  memantine (NAMENDA) 10 MG tablet Take 1 tablet (10 mg total) by mouth 2 (two) times daily. 08/02/19  Yes Melvenia Beam, MD  metoprolol tartrate (LOPRESSOR) 25 MG tablet Take 25 mg by mouth 2 (two) times daily.   Yes [provider]  Multiple Vitamins-Minerals ([employer] SPECTRAVITE PO) Take 1 capsule by mouth daily.   Yes  [provider]  polyethylene glycol (MIRALAX / GLYCOLAX) 17 g packet Take 17 g by mouth daily. Patient taking differently: Take 17 g by mouth daily as needed.  08/09/19  Yes Wieting, Richard, MD  potassium chloride (KLOR-CON M10) 10 MEQ tablet Take 2 tablets (20 mEq total) by mouth every evening. Patient taking differently: Take 10 mEq by mouth daily.  08/09/19  Yes Wieting, Richard, MD  Probiotic Product (PROBIOTIC PO) Take 1 tablet by mouth every other day.    Yes [provider]  vitamin B-12 (CYANOCOBALAMIN) 1000 MCG tablet Take 1,000 mcg by mouth daily.   Yes [provider]  CT HIP RIGHT WO CONTRAST  Result Date: 12/08/2019 CLINICAL DATA:  Fall, abnormal x-ray EXAM: CT OF THE RIGHT HIP WITHOUT CONTRAST TECHNIQUE: Multidetector CT imaging of the right hip was performed according to the standard protocol. Multiplanar CT image reconstructions were also generated. COMPARISON:  Radiograph 12/08/2019, radiograph 02/17/2018, CT abdomen pelvis 07/05/2019 FINDINGS: Bones/Joint/Cartilage Impacted subcapital right femoral neck fracture with few displaced comminuted fracture fragments contained by the joint capsule. Small hip joint effusion is present. No other acute fracture is seen of the proximal right femur or included portions of the bony pelvis. Femoral head remains normally located. Background of mild degenerative change in the right hip with some periacetabular spurring. Sclerotic changes the and thickening at the pubic symphysis may reflect sequela of prior osteitis pubis, nonspecific. Enthesopathic changes present upon the ischial tuberosity and greater trochanter. No acute osseous abnormality of the included portion of the right hand incidentally included within the level of imaging. Ligaments Suboptimally assessed by CT. Muscles and Tendons Fatty atrophy of the gluteus medius and minimus muscle bellies. Milder atrophy of the gluteus maximus as well. Thickening and edematous  changes are seen near the insertions upon the greater trochanter as well. No musculotendinous discontinuity. No intramuscular hematoma or collection. Soft tissues Mild contusive changes of the lateral right hip. Included portions of the pelvis reveals colonic diverticulosis. No acute evidence of diverticulitis. Mild circumferential bladder wall thickening and perivesicular hazy stranding. Postsurgical changes along the low anterior pelvis. Atherosclerotic calcification of the proximal inflow and outflow vessels. IMPRESSION: 1. Impacted subcapital right femoral neck fracture with few displaced comminuted fracture fragments contained by the joint capsule. 2. Small to moderate right hip effusion. 3. Thickening and edema near insertions of the gluteal musculature upon the greater trochanter and mildly within the origin of the vastus lateralis as well. 4. Chronic appearing fatty atrophy of the gluteus medius and minimus and with milder change within the gluteus maximus. 5. Mild circumferential bladder wall thickening and perivesicular hazy stranding. Correlate with urinalysis to exclude cystitis. 6. Colonic diverticulosis without evidence of diverticulitis. 7. Aortic Atherosclerosis (ICD10-I70.0). Electronically Signed   By: Lovena Le M.D.   On: 12/08/2019 01:57   DG Chest Port 1 View  Result Date: 12/07/2019 CLINICAL DATA:  [place] 4 days ago, hip fracture, preoperative evaluation EXAM: PORTABLE CHEST 1 VIEW COMPARISON:  07/05/2017 FINDINGS: Single frontal view of the chest demonstrates a stable cardiac silhouette. Hiatal hernia again noted. No airspace disease, effusion, or pneumothorax. No acute bony abnormalities. IMPRESSION: 1. Stable hiatal hernia.  No acute intrathoracic process. Electronically Signed   By: Randa Ngo M.D.   On: 12/07/2019 18:28   DG Knee Right Port  Result Date: 12/08/2019 CLINICAL DATA:  Pain EXAM: PORTABLE RIGHT KNEE - 1-2 VIEW COMPARISON:  None. FINDINGS: There is no acute displaced  fracture or dislocation. Mild tricompartmental degenerative changes are noted. Atherosclerotic changes are noted. IMPRESSION: Negative. Electronically Signed   By: Constance Holster M.D.   On: 12/08/2019 00:11   DG HIP PORT UNILAT WITH PELVIS 1V RIGHT  Result Date: 12/08/2019 CLINICAL DATA:  Displaced fracture of the right femoral neck. EXAM: DG HIP (WITH OR WITHOUT PELVIS) 1V PORT RIGHT COMPARISON:  None. FINDINGS: Evaluation is limited by patient positioning. There is no definite acute displaced fracture or dislocation. There is a questionable mild step-off in the subcapital region of the proximal right femur. There is osteopenia. IMPRESSION: Questionable mild step-off in the subcapital region of the proximal right femur. Consider further evaluation with CT  if there is high clinical suspicion for right-sided hip fracture. Electronically Signed   By: Constance Holster M.D.   On: 12/08/2019 00:08   VAS [place] LOWER EXTREMITY VENOUS (DVT)  Result Date: 12/08/2019  Lower Venous DVTStudy Indications: Follow up previous extensive bilateral DVT. Pre-operative evaluation, right femoral neck fracture repair.  Limitations: Poor ultrasound/tissue interface and patient position, restricted mobility. Comparison Study: 07/06/2019 right lower extremity venous duplex, 08/06/2019 left                   lower extremity venous duplex Performing Technologist: Maudry Mayhew MHA, RDMS, RVT, RDCS  Examination Guidelines: A complete evaluation includes B-mode imaging, spectral Doppler, color Doppler, and power Doppler as needed of all accessible portions of each vessel. Bilateral testing is considered an integral part of a complete examination. Limited examinations for reoccurring indications may be performed as noted. The reflux portion of the exam is performed with the patient in reverse Trendelenburg.  +---------+---------------+---------+-----------+----------+-----------------+ RIGHT     CompressibilityPhasicitySpontaneityPropertiesThrombus Aging    +---------+---------------+---------+-----------+----------+-----------------+ CFV      None                    No                   Age Indeterminate +---------+---------------+---------+-----------+----------+-----------------+ SFJ      None                                         Age Indeterminate +---------+---------------+---------+-----------+----------+-----------------+ FV Prox  None                                         Age Indeterminate +---------+---------------+---------+-----------+----------+-----------------+ FV Mid   None                                         Age Indeterminate +---------+---------------+---------+-----------+----------+-----------------+ FV DistalNone                                         Age Indeterminate +---------+---------------+---------+-----------+----------+-----------------+ PFV      None                                         Age Indeterminate +---------+---------------+---------+-----------+----------+-----------------+ POP      None                    No                   Age Indeterminate +---------+---------------+---------+-----------+----------+-----------------+ PTV      None                                         Age Indeterminate +---------+---------------+---------+-----------+----------+-----------------+ PERO     None  Age Indeterminate +---------+---------------+---------+-----------+----------+-----------------+   +---------+---------------+---------+-----------+--------------+---------------+ LEFT     CompressibilityPhasicitySpontaneityProperties    Thrombus Aging  +---------+---------------+---------+-----------+--------------+---------------+ CFV      None           Yes      Yes        partially     Age                                                          re-cannalized Indeterminate   +---------+---------------+---------+-----------+--------------+---------------+ SFJ      None                                             Age                                                                       Indeterminate   +---------+---------------+---------+-----------+--------------+---------------+ FV Prox  None                                             Age                                                                       Indeterminate   +---------+---------------+---------+-----------+--------------+---------------+ FV Mid   None                                             Age                                                                       Indeterminate   +---------+---------------+---------+-----------+--------------+---------------+ FV DistalNone                                             Age  Indeterminate   +---------+---------------+---------+-----------+--------------+---------------+ PFV      None                                             Age                                                                       Indeterminate   +---------+---------------+---------+-----------+--------------+---------------+   Left Technical Findings: Not visualized segments include popliteal vein, PTV, peroneal veins.   Summary: RIGHT: - Findings consistent with age indeterminate deep vein thrombosis involving the right common femoral vein, SF junction, right femoral vein, right proximal profunda vein, right popliteal vein, right posterior tibial veins, and right peroneal veins.  LEFT: - Findings consistent with age indeterminate deep vein thrombosis involving the left common femoral vein, SF junction, left femoral vein, and left proximal profunda vein. Unable to adequately visualize popliteal, posterior tibial, and peroneal veins;  therefore cannot exclude DVT in these segments.  When compared to prior studies, there is no significant resolution of DVT seen bilaterally, with the exception of little resolution of the left common femoral vein, given partially recannalized vein.  *See table(s) above for measurements and observations.    Preliminary     Positive ROS: All other systems have been reviewed and were otherwise negative with the exception of those mentioned in the HPI and as above.  Physical Exam: General: Alert, no acute distress Cardiovascular: No pedal edema Respiratory: No cyanosis, no use of accessory musculature GI: No organomegaly, abdomen is soft and non-tender Skin: No lesions in the area of chief complaint Neurologic: Sensation intact distally Psychiatric: Pleasantly confused Lymphatic: No axillary or cervical lymphadenopathy  MUSCULOSKELETAL: Examination of the right hip reveals no skin wounds or lesions.  She holds the extremity in external rotation.  She has pain with logrolling of the hip.  She cannot perform a straight leg raise.  Foot is well-perfused.  She has positive motor function.  Sensation testing unable to be accurately performed due to mental status.  Assessment: Comminuted, unstable right femoral neck fracture. Bilateral lower extremity DVT status post IVC filter placement Hemorrhoids with history of bleeding Unable to take anticoagulants secondary to GI bleed Dementia   Plan: I discussed the findings with the patient and her son and daughter.  This is a complicated situation.  We discussed that her options include closed reduction and percutaneous pinning versus hemiarthroplasty.  After extensive discussion reviewing the risks and benefits, we all mutually agreed to proceed with arthroplasty in order to allow more immediate weightbearing activity and pain relief.  She has extensive bilateral lower extremity DVT.  Luckily, she already has an IVC filter.  Expect more swelling than the  typical patient with longer duration.  They understand and wish to proceed.  All questions were solicited and answered.  The risks, benefits, and alternatives were discussed with the patient. There are risks associated with the surgery including, but not limited to, problems with anesthesia (death), infection, instability (giving out of the joint), dislocation, differences in leg length/angulation/rotation, fracture of bones, loosening or failure of implants, hematoma (blood accumulation) which may require surgical drainage,  blood clots, pulmonary embolism, nerve injury (foot drop and lateral thigh numbness), and blood vessel injury. The patient understands these risks and elects to proceed.   Bertram Savin, MD [phone]    12/08/2019 2:44 PM

## 2019-12-08 NOTE — Anesthesia Procedure Notes (Signed)
Date/Time: 12/08/2019 5:00 PM Performed by: Cynda Familia, CRNA Oxygen Delivery Method: Simple face mask Placement Confirmation: positive ETCO2 and breath sounds checked- equal and bilateral Dental Injury: Teeth and Oropharynx as per pre-operative assessment

## 2019-12-08 NOTE — Progress Notes (Signed)
Initial Nutrition Assessment  DOCUMENTATION CODES:   Not applicable  INTERVENTION:  - diet advancement as medically feasible post-op.  - will order Boost Breeze once/day, each supplement provides 250 kcal and 9 grams of protein. - will order Ensure Enlive BID, each supplement provides 350 kcal and 20 grams of protein. - will order 1 tablet multivitamin with minerals.   NUTRITION DIAGNOSIS:   Increased nutrient needs related to post-op healing, acute illness(COVID-19 infection) as evidenced by estimated needs.  GOAL:   Patient will meet greater than or equal to 90% of their needs  MONITOR:   Diet advancement, PO intake, Supplement acceptance, Labs, Weight trends  REASON FOR ASSESSMENT:   Malnutrition Screening Tool, Consult Hip fracture protocol  ASSESSMENT:   86 y.o. female with medical history of advanced dementia, HTN, recently diagnosed DVT in 07/2019 s/p IVC filter placement, severe bleeding from hemorrhoids. She was diagnosed with COVID-19 in 10/2019. She presented to the ED d/t finding of R hip fx at Orthopedics office. In the ED, her daughter reported that patient fell on 6/5 while walking using her walker.  Patient has been NPO since admission. Flow sheet indicates that she is a/o to self only and has hx of advanced dementia. Patient currently on airborne precautions as she was dx with COVID-19 in May. Notes indicate plan to go to OR today for R hip fx.   Per chart review, weight today is 143 lb, weight on 4/22 was 147 lb, and weight on 3/16 was 151 lb.    Labs reviewed; creatinine: 1.01 mg/dl, GFR: 50 ml/min. Medications reviewed; 10 mg melatonin/day, 17 g miralax/day, 1000 mcg oral cyanocobalamin/day.     NUTRITION - FOCUSED PHYSICAL EXAM:  unable to complete at this time.   Diet Order:   Diet Order            Diet NPO time specified Except for: Sips with Meds  Diet effective midnight        Diet NPO time specified Except for: Sips with Meds  Diet effective  midnight              EDUCATION NEEDS:   No education needs have been identified at this time  Skin:  Skin Assessment: Reviewed RN Assessment  Last BM:  6/9  Height:   Ht Readings from Last 1 Encounters:  12/08/19 5\' 2"  (1.575 m)    Weight:   Wt Readings from Last 1 Encounters:  12/08/19 65 kg    Estimated Nutritional Needs:  Kcal:  1700-1900 kcal Protein:  85-95 grams Fluid:  >/= 1.8 L/day     Jarome Matin, MS, RD, LDN, CNSC Inpatient Clinical Dietitian RD pager # available in [facility]  After hours/weekend pager # available in [facility]

## 2019-12-08 NOTE — Op Note (Signed)
OPERATIVE REPORT  SURGEON: Rod Can, MD   ASSISTANT: Sherlean Foot, RNFA  PREOPERATIVE DIAGNOSIS: Unstable Right femoral neck fracture.   POSTOPERATIVE DIAGNOSIS: Unstable Right femoral neck fracture.   PROCEDURE: Right hip hemiarthroplasty, anterior approach.   IMPLANTS: DePuy Tri Lock stem, size 4, std offset, with a +0 mm spacer and a 45 mm monopolar head ball.  ANESTHESIA:  General  ANTIBIOTICS: 2g ancef.  ESTIMATED BLOOD LOSS:-250 mL    DRAINS: None.  COMPLICATIONS: None   CONDITION: PACU - hemodynamically stable.   BRIEF CLINICAL NOTE: Wendy Krueger is a 86 y.o. female with a comminuted unstable Right femoral neck fracture. The patient was admitted to the hospitalist service and underwent perioperative risk stratification and medical optimization. The risks, benefits, and alternatives to hemiarthroplasty were explained, and the patient elected to proceed.  PROCEDURE IN DETAIL: The patient was taken to the operating room and general anesthesia was induced on the hospital bed.  The patient was then positioned on the Hana table.  All bony prominences were well padded.  The hip was prepped and draped in the normal sterile surgical fashion.  A time-out was called verifying side and site of surgery. Antibiotics were given within 60 minutes of beginning the procedure.   The direct anterior approach to the hip was performed through the Hueter interval.  Lateral femoral circumflex vessels were treated with the Auqumantys. The anterior capsule was exposed and an inverted T capsulotomy was made.  Fracture hematoma was encountered and evacuated. The patient was found to have a comminuted Right subcapital femoral neck fracture.  I freshened the femoral neck cut with a saw.  I removed the femoral neck fragment.  A corkscrew was placed into the head and the head was removed.  This was passed to the back table and was measured.   Acetabular exposure was achieved.  I examined  the articular cartilage which was intact.  The labrum was intact. A 45 mm trial head was placed and found to have excellent fit.   I then gained femoral exposure taking care to protect the abductors and greater trochanter.  This was performed using standard external rotation, extension, and adduction.  The capsule was peeled off the inner aspect of the greater trochanter, taking care to preserve the short external rotators. A cookie cutter was used to enter the femoral canal, and then the femoral canal finder was used to confirm location.  I then sequentially broached up to a size 4.  Calcar planer was used on the femoral neck remnant.  I paced a std neck and a 36 + 1.5 head ball. The hip was reduced.  Leg lengths were checked fluoroscopically.  The hip was dislocated and trial components were removed.  I placed the real stem followed by the real spacer and head ball.  A single reduction maneuver was performed and the hip was reduced.  Fluoroscopy was used to confirm component position and leg lengths.  At 90 degrees of external rotation and extension, the hip was stable to an anterior directed force.   The wound was copiously irrigated with Irrisept solution and normal saline using pule lavage.  Marcaine solution was injected into the periarticular soft tissue.  The wound was closed in layers using #1 Vicryl and V-Loc for the fascia, 2-0 Vicryl for the subcutaneous fat, 2-0 Monocryl for the deep dermal layer, 3-0 running Monocryl subcuticular stitch and glue for the skin.  Once the glue was fully dried, an Aquacell Ag dressing was applied.  The patient was then awakened from anesthesia and transported to the recovery room in stable condition.  Sponge, needle, and instrument counts were correct at the end of the case x2.  The patient tolerated the procedure well and there were no known complications.  Please note that a surgical assistant was a medical necessity for this procedure to perform it in a safe and  expeditious manner. Assistant was necessary to provide appropriate retraction of vital neurovascular structures, to prevent femoral fracture, and to allow for anatomic placement of the prosthesis.

## 2019-12-08 NOTE — Progress Notes (Signed)
PROGRESS NOTE  Wendy Krueger  UJW:[id number] DOB: [birth year] DOA: 12/07/2019 PCP: Abner Greenspan, MD   Brief Narrative: Wendy Krueger is an 86 y.o. 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 She was admitted 6/8 for operative repair 6/9.  Assessment & Plan: Principal Problem:   Closed right hip fracture ([place]) Active Problems:   Essential hypertension   Dementia (HCC)  Closed right hip fracture:  - Operative management per orthopedics. - PT/OT, CSW  Iron deficiency anemia:  - Monitor blood counts.  - CBC in AM  History of covid-19 infection: No isolation needed at this time.   HTN:  - Continue home medications  History of DVT s/p IVC filter:  - Has such severe history of hemorrhoidal bleeding, anticoagulation has not been given and will not be given currently. OOB as soon as possible.   Dementia:  - Continue home medications.  - Called daughter, no answer, will try for update again as available.   DVT prophylaxis: SCDs Code Status: DNR Family Communication: None at bedside, not able to reach daughter by phone this PM. Disposition Plan:  Status is: Inpatient  Remains inpatient appropriate because:Unsafe d/c plan and Inpatient level of care appropriate due to severity of illness   Dispo: The patient is from: Home              Anticipated d/c is to: SNF              Anticipated d/c date is: 2 days              Patient currently is not medically stable to d/c.  Consultants:   Orthopedics, Dr. Lyla Glassing  Procedures:   Right hip surgery 12/08/2019  Antimicrobials:  None   Subjective: Confused but reports mild pain in right hip, still able to lift leg off bed, follows commands, but this hurts more. No numbness or new complaints.    Objective: Vitals:   12/08/19 0145 12/08/19 0627 12/08/19 0925 12/08/19 1424  BP: (!) 147/126 (!) 156/75 130/77 (!) 164/79  Pulse: 68 64 60 73  Resp: 18 16 18 16   Temp: 97.9 F (36.6 C) 98.2 F (36.8 C) 98.7 F (37.1 C) 98.3 F (36.8 C)  TempSrc: Oral Oral Oral Oral  SpO2: 96% 96% 98% 97%  Weight: 65 kg   65 kg  Height: 5\' 2"  (1.575 m)   5\' 2"  (1.575 m)    Intake/Output Summary (Last 24 hours) at 12/08/2019 1657 Last data filed at 12/08/2019 1652 Gross per 24 hour  Intake 1226.65 ml  Output 301 ml  Net 925.65 ml   Filed Weights   12/08/19 0145 12/08/19 1424  Weight: 65 kg 65 kg    Gen: 86 y.o. female in no distress  Pulm: Non-labored breathing room air. Clear to auscultation bilaterally.  CV: Regular rate and rhythm. No murmur, rub, or gallop. No JVD, no pedal edema. GI: Abdomen soft, non-tender, non-distended, with normoactive bowel sounds. No organomegaly or masses felt. Ext: Warm, RLE externally rotated with limited hip ROM. Skin: No rashes, lesions or ulcers Neuro: Alert and disoriented. No focal neurological deficits. Psych: Judgement and insight appear normal. Mood & affect appropriate.   Data Reviewed: I have personally reviewed following labs and imaging studies  CBC: Recent Labs  Lab 12/07/19 1830 12/08/19 0433  WBC 5.7 4.7  NEUTROABS 4.0  --   HGB 12.5 11.8*  HCT 42.3 39.1  MCV 93.2 92.9  PLT 162 062*   Basic Metabolic Panel: Recent Labs  Lab 12/07/19 1830 12/08/19 0433  NA 140 140  K 4.3 4.0  CL 106 107  CO2 25 24  GLUCOSE 99 87  BUN 21 22  CREATININE 0.88 1.01*  CALCIUM 9.0 8.8*   GFR: Estimated Creatinine Clearance: 34.1 mL/min (A) (by C-G formula based on SCr of 1.01 mg/dL (H)). Liver Function Tests: Recent Labs  Lab 12/07/19 1830  AST 41  ALT 22  ALKPHOS 123  BILITOT 0.6  PROT 6.8  ALBUMIN 3.3*   No results for input(s): LIPASE, AMYLASE in the last 168 hours. No results for input(s): AMMONIA in the last 168  hours. Coagulation Profile: Recent Labs  Lab 12/07/19 1830  INR 0.9   Cardiac Enzymes: No results for input(s): CKTOTAL, CKMB, CKMBINDEX, TROPONINI in the last 168 hours. BNP (last 3 results) No results for input(s): PROBNP in the last 8760 hours. HbA1C: No results for input(s): HGBA1C in the last 72 hours. CBG: No results for input(s): GLUCAP in the last 168 hours. Lipid Profile: No results for input(s): CHOL, HDL, LDLCALC, TRIG, CHOLHDL, LDLDIRECT in the last 72 hours. Thyroid Function Tests: No results for input(s): TSH, T4TOTAL, FREET4, T3FREE, THYROIDAB in the last 72 hours. Anemia Panel: No results for input(s): VITAMINB12, FOLATE, FERRITIN, TIBC, IRON, RETICCTPCT in the last 72 hours. Urine analysis:    Component Value Date/Time   COLORURINE AMBER (A) 12/07/2019 1754   APPEARANCEUR HAZY (A) 12/07/2019 1754   LABSPEC 1.026 12/07/2019 1754   PHURINE 5.0 12/07/2019 1754   GLUCOSEU NEGATIVE 12/07/2019 1754   HGBUR NEGATIVE 12/07/2019 1754   BILIRUBINUR NEGATIVE 12/07/2019 1754   BILIRUBINUR Negative 02/18/2019 1658   KETONESUR NEGATIVE 12/07/2019 1754   PROTEINUR NEGATIVE 12/07/2019 1754   UROBILINOGEN 1.0 02/18/2019 1658   UROBILINOGEN 0.2 03/20/2014 0753   NITRITE POSITIVE (A) 12/07/2019 1754   LEUKOCYTESUR MODERATE (A) 12/07/2019 1754   Recent Results (from the past 240 hour(s))  SARS Coronavirus 2 by RT PCR (hospital order, performed in [place] hospital lab) Nasopharyngeal Nasopharyngeal Swab     Status: Abnormal   Collection Time: 12/07/19  5:55 PM   Specimen: Nasopharyngeal Swab  Result Value Ref Range Status   SARS Coronavirus 2 POSITIVE (A) NEGATIVE Final    Comment: RESULT CALLED TO, READ BACK BY AND VERIFIED WITH: SMITH,J. RN @2038  ON 06.08.2021 BY COHEN,K (NOTE) SARS-CoV-2 target nucleic acids are DETECTED SARS-CoV-2 RNA is generally detectable in upper respiratory specimens  during the acute phase of infection.  Positive results are indicative  of  the presence of the identified virus, but do not rule out bacterial infection or co-infection with other pathogens not detected by the test.  Clinical correlation with patient history and  other diagnostic information is necessary to determine patient infection status.  The expected result is negative. Fact Sheet for Patients:   StrictlyIdeas.no  Fact Sheet for Healthcare Providers:   BankingDealers.co.za   This test is not yet approved or cleared by the [place] FDA and  has been authorized for detection and/or diagnosis of SARS-CoV-2 by FDA under an Emergency Use Authorization (EUA).  This EUA will remain in effect (meaning this te st can be used) for the duration of  the COVID-19 declaration under Section 564(b)(1) of the Act, 21 U.S.C. section 360-bbb-3(b)(1), unless the  authorization is terminated or revoked sooner. Performed at [facility], [place] [address], [place], [place] [address]   MRSA PCR Screening     Status: None   Collection Time: 12/08/19 10:25 AM   Specimen: Nasopharyngeal  Result Value Ref Range Status   MRSA by PCR NEGATIVE NEGATIVE Final    Comment:        The GeneXpert MRSA Assay (FDA approved for NASAL specimens only), is one component of a comprehensive MRSA colonization surveillance program. It is not intended to diagnose MRSA infection nor to guide or monitor treatment for MRSA infections. Performed at [facility], [place] [address] [place], [place] [address]       Radiology Studies: CT HIP RIGHT WO CONTRAST  Result Date: 12/08/2019 CLINICAL DATA:  Fall, abnormal x-ray EXAM: CT OF THE RIGHT HIP WITHOUT CONTRAST TECHNIQUE: Multidetector CT imaging of the right hip was performed according to the standard protocol. Multiplanar CT image reconstructions were also generated. COMPARISON:  Radiograph 12/08/2019, radiograph 02/17/2018, CT abdomen pelvis 07/05/2019 FINDINGS:  Bones/Joint/Cartilage Impacted subcapital right femoral neck fracture with few displaced comminuted fracture fragments contained by the joint capsule. Small hip joint effusion is present. No other acute fracture is seen of the proximal right femur or included portions of the bony pelvis. Femoral head remains normally located. Background of mild degenerative change in the right hip with some periacetabular spurring. Sclerotic changes the and thickening at the pubic symphysis may reflect sequela of prior osteitis pubis, nonspecific. Enthesopathic changes present upon the ischial tuberosity and greater trochanter. No acute osseous abnormality of the included portion of the right hand incidentally included within the level of imaging. Ligaments Suboptimally assessed by CT. Muscles and Tendons Fatty atrophy of the gluteus medius and minimus muscle bellies. Milder atrophy of the gluteus maximus as well. Thickening and edematous changes are seen near the insertions upon the greater trochanter as well. No musculotendinous discontinuity. No intramuscular hematoma or collection. Soft tissues Mild contusive changes of the lateral right hip. Included portions of the pelvis reveals colonic diverticulosis. No acute evidence of diverticulitis. Mild circumferential bladder wall thickening and perivesicular hazy stranding. Postsurgical changes along the low anterior pelvis. Atherosclerotic calcification of the proximal inflow and outflow vessels. IMPRESSION: 1. Impacted subcapital right femoral neck fracture with few displaced comminuted fracture fragments contained by the joint capsule. 2. Small to moderate right hip effusion. 3. Thickening and edema near insertions of the gluteal musculature upon the greater trochanter and mildly within the origin of the vastus lateralis as well. 4. Chronic appearing fatty atrophy of the gluteus medius and minimus and with milder change within the gluteus maximus. 5. Mild circumferential bladder  wall thickening and perivesicular hazy stranding. Correlate with urinalysis to exclude cystitis. 6. Colonic diverticulosis without evidence of diverticulitis. 7. Aortic Atherosclerosis (ICD10-I70.0). Electronically Signed   By: Lovena Le M.D.   On: 12/08/2019 01:57   DG Chest Port 1 View  Result Date: 12/07/2019 CLINICAL DATA:  [place] 4 days ago, hip fracture, preoperative evaluation EXAM: PORTABLE CHEST 1 VIEW COMPARISON:  07/05/2017 FINDINGS: Single frontal view of the chest demonstrates a stable cardiac silhouette. Hiatal hernia again noted. No airspace disease, effusion, or pneumothorax. No acute bony abnormalities. IMPRESSION: 1. Stable hiatal hernia.  No acute intrathoracic process. Electronically Signed   By: Randa Ngo M.D.   On: 12/07/2019 18:28   DG Knee Right Port  Result Date: 12/08/2019 CLINICAL DATA:  Pain EXAM: PORTABLE RIGHT KNEE - 1-2 VIEW COMPARISON:  None.  FINDINGS: There is no acute displaced fracture or dislocation. Mild tricompartmental degenerative changes are noted. Atherosclerotic changes are noted. IMPRESSION: Negative. Electronically Signed   By: Constance Holster M.D.   On: 12/08/2019 00:11   DG C-Arm 1-60 Min-No Report  Result Date: 12/08/2019 Fluoroscopy was utilized by the requesting physician.  No radiographic interpretation.   DG HIP PORT UNILAT WITH PELVIS 1V RIGHT  Result Date: 12/08/2019 CLINICAL DATA:  Displaced fracture of the right femoral neck. EXAM: DG HIP (WITH OR WITHOUT PELVIS) 1V PORT RIGHT COMPARISON:  None. FINDINGS: Evaluation is limited by patient positioning. There is no definite acute displaced fracture or dislocation. There is a questionable mild step-off in the subcapital region of the proximal right femur. There is osteopenia. IMPRESSION: Questionable mild step-off in the subcapital region of the proximal right femur. Consider further evaluation with CT if there is high clinical suspicion for right-sided hip fracture. Electronically Signed   By:  Constance Holster M.D.   On: 12/08/2019 00:08   VAS [place] LOWER EXTREMITY VENOUS (DVT)  Result Date: 12/08/2019  Lower Venous DVTStudy Indications: Follow up previous extensive bilateral DVT. Pre-operative evaluation, right femoral neck fracture repair.  Limitations: Poor ultrasound/tissue interface and patient position, restricted mobility. Comparison Study: 07/06/2019 right lower extremity venous duplex, 08/06/2019 left                   lower extremity venous duplex Performing Technologist: Maudry Mayhew MHA, RDMS, RVT, RDCS  Examination Guidelines: A complete evaluation includes B-mode imaging, spectral Doppler, color Doppler, and power Doppler as needed of all accessible portions of each vessel. Bilateral testing is considered an integral part of a complete examination. Limited examinations for reoccurring indications may be performed as noted. The reflux portion of the exam is performed with the patient in reverse Trendelenburg.  +---------+---------------+---------+-----------+----------+-----------------+ RIGHT    CompressibilityPhasicitySpontaneityPropertiesThrombus Aging    +---------+---------------+---------+-----------+----------+-----------------+ CFV      None                    No                   Age Indeterminate +---------+---------------+---------+-----------+----------+-----------------+ SFJ      None                                         Age Indeterminate +---------+---------------+---------+-----------+----------+-----------------+ FV Prox  None                                         Age Indeterminate +---------+---------------+---------+-----------+----------+-----------------+ FV Mid   None                                         Age Indeterminate +---------+---------------+---------+-----------+----------+-----------------+ FV DistalNone                                         Age Indeterminate  +---------+---------------+---------+-----------+----------+-----------------+ PFV      None  Age Indeterminate +---------+---------------+---------+-----------+----------+-----------------+ POP      None                    No                   Age Indeterminate +---------+---------------+---------+-----------+----------+-----------------+ PTV      None                                         Age Indeterminate +---------+---------------+---------+-----------+----------+-----------------+ PERO     None                                         Age Indeterminate +---------+---------------+---------+-----------+----------+-----------------+   +---------+---------------+---------+-----------+--------------+---------------+ LEFT     CompressibilityPhasicitySpontaneityProperties    Thrombus Aging  +---------+---------------+---------+-----------+--------------+---------------+ CFV      None           Yes      Yes        partially     Age                                                         re-cannalized Indeterminate   +---------+---------------+---------+-----------+--------------+---------------+ SFJ      None                                             Age                                                                       Indeterminate   +---------+---------------+---------+-----------+--------------+---------------+ FV Prox  None                                             Age                                                                       Indeterminate   +---------+---------------+---------+-----------+--------------+---------------+ FV Mid   None                                             Age  Indeterminate   +---------+---------------+---------+-----------+--------------+---------------+ FV DistalNone                                              Age                                                                       Indeterminate   +---------+---------------+---------+-----------+--------------+---------------+ PFV      None                                             Age                                                                       Indeterminate   +---------+---------------+---------+-----------+--------------+---------------+   Left Technical Findings: Not visualized segments include popliteal vein, PTV, peroneal veins.   Summary: RIGHT: - Findings consistent with age indeterminate deep vein thrombosis involving the right common femoral vein, SF junction, right femoral vein, right proximal profunda vein, right popliteal vein, right posterior tibial veins, and right peroneal veins.  LEFT: - Findings consistent with age indeterminate deep vein thrombosis involving the left common femoral vein, SF junction, left femoral vein, and left proximal profunda vein. Unable to adequately visualize popliteal, posterior tibial, and peroneal veins; therefore cannot exclude DVT in these segments.  When compared to prior studies, there is no significant resolution of DVT seen bilaterally, with the exception of little resolution of the left common femoral vein, given partially recannalized vein.  *See table(s) above for measurements and observations. Electronically signed by Monica Martinez MD on 12/08/2019 at 3:54:22 PM.    Final     Scheduled Meds: . [MAR Hold] donepezil  10 mg Oral QHS  . [MAR Hold] feeding supplement  1 Container Oral Q24H  . [MAR Hold] feeding supplement (ENSURE ENLIVE)  237 mL Oral BID BM  . feeding supplement  296 mL Oral Once  . [MAR Hold] iron polysaccharides  150 mg Oral BID  . [MAR Hold] melatonin  10 mg Oral QHS  . [MAR Hold] memantine  10 mg Oral BID  . [MAR Hold] metoprolol tartrate  25 mg Oral BID  . [MAR Hold] multivitamin with minerals  1 tablet Oral  Daily  . [MAR Hold] polyethylene glycol  17 g Oral Daily  . povidone-iodine  2 application Topical Once  . [MAR Hold] vitamin B-12  1,000 mcg Oral Daily   Continuous Infusions: . lactated ringers 50 mL/hr at 12/08/19 1432     LOS: 1 day   Time spent: 25 minutes.  Patrecia Pour, MD [facility] [email or web address] 12/08/2019, 4:57 PM

## 2019-12-08 NOTE — Anesthesia Postprocedure Evaluation (Signed)
Anesthesia Post Note  Patient: Wendy Krueger  Procedure(s) Performed: ANTERIOR APPROACH HEMI HIP ARTHROPLASTY (Right )     Patient location during evaluation: PACU Anesthesia Type: General Level of consciousness: awake and alert Pain management: pain level controlled Vital Signs Assessment: post-procedure vital signs reviewed and stable Respiratory status: spontaneous breathing, nonlabored ventilation, respiratory function stable and patient connected to nasal cannula oxygen Cardiovascular status: blood pressure returned to baseline and stable Postop Assessment: no apparent nausea or vomiting Anesthetic complications: no    Last Vitals:  Vitals:   12/08/19 2009 12/08/19 2115  BP: (!) 148/70 (!) 144/69  Pulse: 66 64  Resp: 16 16  Temp: 36.6 C 36.7 C  SpO2: 100% 100%    Last Pain:  Vitals:   12/08/19 2115  TempSrc: Oral  PainSc:                  Jaqualyn Juday S

## 2019-12-08 NOTE — Progress Notes (Signed)
Per call to [facility], spoke with Jaci Carrel, RN; pt tested positive covid on 11/11/19 (unaware they had to report to health dept).  Advised her to contact [facility] for reporting.

## 2019-12-09 ENCOUNTER — Encounter (HOSPITAL_COMMUNITY): Payer: Self-pay | Admitting: Orthopedic Surgery

## 2019-12-09 LAB — CBC
HCT: 34 % — ABNORMAL LOW (ref 36.0–46.0)
Hemoglobin: 10.2 g/dL — ABNORMAL LOW (ref 12.0–15.0)
MCH: 27.6 pg (ref 26.0–34.0)
MCHC: 30 g/dL (ref 30.0–36.0)
MCV: 92.1 fL (ref 80.0–100.0)
Platelets: 153 10*3/uL (ref 150–400)
RBC: 3.69 MIL/uL — ABNORMAL LOW (ref 3.87–5.11)
RDW: 16.6 % — ABNORMAL HIGH (ref 11.5–15.5)
WBC: 7.3 10*3/uL (ref 4.0–10.5)
nRBC: 0 % (ref 0.0–0.2)

## 2019-12-09 LAB — BASIC METABOLIC PANEL
Anion gap: 12 (ref 5–15)
BUN: 24 mg/dL — ABNORMAL HIGH (ref 8–23)
CO2: 21 mmol/L — ABNORMAL LOW (ref 22–32)
Calcium: 8.6 mg/dL — ABNORMAL LOW (ref 8.9–10.3)
Chloride: 105 mmol/L (ref 98–111)
Creatinine, Ser: 1.1 mg/dL — ABNORMAL HIGH (ref 0.44–1.00)
GFR calc Af Amer: 52 mL/min — ABNORMAL LOW (ref >60)
GFR calc non Af Amer: 45 mL/min — ABNORMAL LOW (ref >60)
Glucose, Bld: 179 mg/dL — ABNORMAL HIGH (ref 70–99)
Potassium: 4.1 mmol/L (ref 3.5–5.1)
Sodium: 138 mmol/L (ref 135–145)

## 2019-12-09 NOTE — Progress Notes (Signed)
    Subjective:  Patient reports pain as mild.  Denies N/V/CP/SOB. No c/o  Objective:   VITALS:   Vitals:   12/08/19 2009 12/08/19 2115 12/09/19 0038 12/09/19 0502  BP: (!) 148/70 (!) 144/69 135/76 (!) 154/77  Pulse: 66 64 72 73  Resp: 16 16 18 20   Temp: 97.9 F (36.6 C) 98 F (36.7 C) 98.1 F (36.7 C) 98 F (36.7 C)  TempSrc: Oral Oral Oral Oral  SpO2: 100% 100% 94% 96%  Weight:      Height:        NAD ABD soft Intact pulses distally Dorsiflexion/Plantar flexion intact Incision: dressing C/D/I Compartment soft   Lab Results  Component Value Date   WBC 7.3 12/09/2019   HGB 10.2 (L) 12/09/2019   HCT 34.0 (L) 12/09/2019   MCV 92.1 12/09/2019   PLT 153 12/09/2019   BMET    Component Value Date/Time   NA 138 12/09/2019 0342   K 4.1 12/09/2019 0342   CL 105 12/09/2019 0342   CO2 21 (L) 12/09/2019 0342   GLUCOSE 179 (H) 12/09/2019 0342   BUN 24 (H) 12/09/2019 0342   CREATININE 1.10 (H) 12/09/2019 0342   CALCIUM 8.6 (L) 12/09/2019 0342   GFRNONAA 45 (L) 12/09/2019 0342   GFRAA 52 (L) 12/09/2019 0342     Assessment/Plan: 1 Day Post-Op   Principal Problem:   Closed right hip fracture (HCC) Active Problems:   Essential hypertension   Dementia (HCC) chronic BLE DVT   WBAT with walker DVT ppx: Lovenox in house --> home on ASA 81 mg PO BID, SCDs, TEDS PO pain control PT/OT Dispo: d/c home with hhpt when ok with hospitalist   Hilton Cork Darion Juhasz 12/09/2019, 1:44 PM   Rod Can, MD [phone] [place] is now [facility]  [place] [address], [place], [place] [address] Phone: [phone] [email or web address] Facebook  [employer]

## 2019-12-09 NOTE — Progress Notes (Signed)
Patient gets nervous. Restless at nighttime without family present. Patient is confused and only oriented to self. Wendy Krueger approves for one of the patients daughters to stay the night to help with orientation for the patient. Explained this to the family and are in agreance.

## 2019-12-09 NOTE — Progress Notes (Addendum)
PROGRESS NOTE  [name]  TIW:[id number] DOB: 1929-07-09 DOA: 12/07/2019 PCP: Abner Greenspan, MD   Brief Narrative: Wendy Krueger is an 86 y.o. 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 She was admitted 6/8 for operative repair 6/9.  Assessment & Plan: Principal Problem:   Closed right hip fracture ([place]) Active Problems:   Essential hypertension   Dementia (HCC)  Closed right hip fracture:  - Operative management per orthopedics. D/w Dr. Lyla Glassing and with daughter at length regarding DVT prophylaxis, though this would more be for prophylaxis of propagation of known DVTs. We will compromise with aspirin 81mg  BID and hold if bleeding begins. The daughter understands the risks of not giving full anticoagulation (has been described at length by myself and prior MDs) and of severe life threatening anemia that could come with blood thinners. - PT/OT evaluated, plan to DC home with hospital bed/DME  Stage IIIb CKD:  - Avoid nephrotoxins  Iron deficiency anemia:  - Monitor blood counts.  - Continue iron  History of covid-19 infection: No isolation needed at this time.   HTN:  - Continue home medications  History of DVT s/p IVC filter:  - Has such severe history of hemorrhoidal bleeding, anticoagulation has not been given. Confirmed DVT on U/S here. Will give aspirin.  Dementia:  - Continue home medications.  - Spoke with daughter.   DVT prophylaxis: SCDs, lovenox here Code Status: DNR Family Communication: Daughter at bedside Disposition Plan:  Status is: Inpatient  Remains inpatient appropriate because:Unsafe d/c plan and Inpatient level of care appropriate due to severity of illness   Dispo: The patient is from: Home               Anticipated d/c is to: SNF              Anticipated d/c date is: 2 days              Patient currently is not medically stable to d/c.  Consultants:   Orthopedics, Dr. Lyla Glassing  Procedures:   Right hip surgery 12/08/2019  Antimicrobials:  None   Subjective: Confused, denies any pain.   Objective: Vitals:   12/08/19 2115 12/09/19 0038 12/09/19 0502 12/09/19 1413  BP: (!) 144/69 135/76 (!) 154/77 98/69  Pulse: 64 72 73 74  Resp: 16 18 20 17   Temp: 98 F (36.7 C) 98.1 F (36.7 C) 98 F (36.7 C) 98.3 F (36.8 C)  TempSrc: Oral Oral Oral   SpO2: 100% 94% 96% 94%  Weight:      Height:        Intake/Output Summary (Last 24 hours) at 12/09/2019 1523 Last data filed at 12/09/2019 1458 Gross per 24 hour  Intake 1566.65 ml  Output 660 ml  Net 906.65 ml   Filed Weights   12/08/19 0145 12/08/19 1424  Weight: 65 kg 65 kg   Gen: 86 y.o. female in no distress Pulm: Nonlabored breathing room air. Clear. CV: Regular rate and rhythm. No murmur, rub, or gallop. No JVD, no dependent edema. GI: Abdomen soft, non-tender, non-distended, with normoactive bowel sounds.  Ext: Warm, no deformities. Right anterior hip aquacel is c/d/i, good ROM. Skin: No rashes, lesions or ulcers on visualized skin. Neuro: Alert and disoriented. No focal neurological deficits. Psych:  Judgement and insight appear fair. Mood euthymic & affect congruent. Behavior is appropriate.    Data Reviewed: I have personally reviewed following labs and imaging studies  CBC: Recent Labs  Lab 12/07/19 1830 12/08/19 0433 12/08/19 1827 12/09/19 0342  WBC 5.7 4.7 6.2 7.3  NEUTROABS 4.0  --   --   --   HGB 12.5 11.8* 11.5* 10.2*  HCT 42.3 39.1 38.3 34.0*  MCV 93.2 92.9 94.6 92.1  PLT 162 137* 135* 102   Basic Metabolic Panel: Recent Labs  Lab 12/07/19 1830 12/08/19 0433 12/08/19 1827 12/09/19 0342  NA 140 140  --  138  K 4.3 4.0  --  4.1  CL 106 107  --  105  CO2 25 24  --  21*  GLUCOSE 99 87  --  179*    BUN 21 22  --  24*  CREATININE 0.88 1.01* 0.99 1.10*  CALCIUM 9.0 8.8*  --  8.6*   GFR: Estimated Creatinine Clearance: 31.3 mL/min (A) (by C-G formula based on SCr of 1.1 mg/dL (H)). Liver Function Tests: Recent Labs  Lab 12/07/19 1830  AST 41  ALT 22  ALKPHOS 123  BILITOT 0.6  PROT 6.8  ALBUMIN 3.3*   No results for input(s): LIPASE, AMYLASE in the last 168 hours. No results for input(s): AMMONIA in the last 168 hours. Coagulation Profile: Recent Labs  Lab 12/07/19 1830  INR 0.9   Cardiac Enzymes: No results for input(s): CKTOTAL, CKMB, CKMBINDEX, TROPONINI in the last 168 hours. BNP (last 3 results) No results for input(s): PROBNP in the last 8760 hours. HbA1C: No results for input(s): HGBA1C in the last 72 hours. CBG: No results for input(s): GLUCAP in the last 168 hours. Lipid Profile: No results for input(s): CHOL, HDL, LDLCALC, TRIG, CHOLHDL, LDLDIRECT in the last 72 hours. Thyroid Function Tests: No results for input(s): TSH, T4TOTAL, FREET4, T3FREE, THYROIDAB in the last 72 hours. Anemia Panel: No results for input(s): VITAMINB12, FOLATE, FERRITIN, TIBC, IRON, RETICCTPCT in the last 72 hours. Urine analysis:    Component Value Date/Time   COLORURINE AMBER (A) 12/07/2019 1754   APPEARANCEUR HAZY (A) 12/07/2019 1754   LABSPEC 1.026 12/07/2019 1754   PHURINE 5.0 12/07/2019 1754   GLUCOSEU NEGATIVE 12/07/2019 1754   HGBUR NEGATIVE 12/07/2019 1754   BILIRUBINUR NEGATIVE 12/07/2019 1754   BILIRUBINUR Negative 02/18/2019 1658   KETONESUR NEGATIVE 12/07/2019 1754   PROTEINUR NEGATIVE 12/07/2019 1754   UROBILINOGEN 1.0 02/18/2019 1658   UROBILINOGEN 0.2 03/20/2014 0753   NITRITE POSITIVE (A) 12/07/2019 1754   LEUKOCYTESUR MODERATE (A) 12/07/2019 1754   Recent Results (from the past 240 hour(s))  SARS Coronavirus 2 by RT PCR (hospital order, performed in [place] hospital lab) Nasopharyngeal Nasopharyngeal Swab     Status: Abnormal   Collection Time:  12/07/19  5:55 PM   Specimen: Nasopharyngeal Swab  Result Value Ref Range Status   SARS Coronavirus 2 POSITIVE (A) NEGATIVE Final    Comment: RESULT CALLED TO, READ BACK BY AND VERIFIED WITH: SMITH,J. RN @2038  ON 06.08.2021 BY COHEN,K (NOTE) SARS-CoV-2 target nucleic acids are DETECTED SARS-CoV-2 RNA is generally detectable in upper respiratory specimens  during the acute phase of infection.  Positive results are indicative  of the presence of the identified virus, but do not rule out bacterial infection or co-infection with other pathogens not detected by the test.  Clinical correlation with patient history and  other diagnostic information is necessary to determine patient infection status.  The expected  result is negative. Fact Sheet for Patients:   StrictlyIdeas.no  Fact Sheet for Healthcare Providers:   BankingDealers.co.za   This test is not yet approved or cleared by the [place] FDA and  has been authorized for detection and/or diagnosis of SARS-CoV-2 by FDA under an Emergency Use Authorization (EUA).  This EUA will remain in effect (meaning this te st can be used) for the duration of  the COVID-19 declaration under Section 564(b)(1) of the Act, 21 U.S.C. section 360-bbb-3(b)(1), unless the authorization is terminated or revoked sooner. Performed at [facility], [place] [address] [place], [place] [address]   MRSA PCR Screening     Status: None   Collection Time: 12/08/19 10:25 AM   Specimen: Nasopharyngeal  Result Value Ref Range Status   MRSA by PCR NEGATIVE NEGATIVE Final    Comment:        The GeneXpert MRSA Assay (FDA approved for NASAL specimens only), is one component of a comprehensive MRSA colonization surveillance program. It is not intended to diagnose MRSA infection nor to guide or monitor treatment for MRSA infections. Performed at [facility], [place] [address]  [place], [place] [address]       Radiology Studies: Pelvis Portable  Result Date: 12/08/2019 CLINICAL DATA:  Postop right hip replacement. EXAM: PORTABLE PELVIS 1-2 VIEWS COMPARISON:  Radiographs yesterday. FINDINGS: Right hip arthroplasty in expected alignment. No periprosthetic lucency. Recent postsurgical change includes air and edema in the soft tissues. IMPRESSION: Right hip arthroplasty without immediate postoperative complication. Electronically Signed   By: Keith Rake M.D.   On: 12/08/2019 17:59   CT HIP RIGHT WO CONTRAST  Result Date: 12/08/2019 CLINICAL DATA:  Fall, abnormal x-ray EXAM: CT OF THE RIGHT HIP WITHOUT CONTRAST TECHNIQUE: Multidetector CT imaging of the right hip was performed according to the standard protocol. Multiplanar CT image reconstructions were also generated. COMPARISON:  Radiograph 12/08/2019, radiograph 02/17/2018, CT abdomen pelvis 07/05/2019 FINDINGS: Bones/Joint/Cartilage Impacted subcapital right femoral neck fracture with few displaced comminuted fracture fragments contained by the joint capsule. Small hip joint effusion is present. No other acute fracture is seen of the proximal right femur or included portions of the bony pelvis. Femoral head remains normally located. Background of mild degenerative change in the right hip with some periacetabular spurring. Sclerotic changes the and thickening at the pubic symphysis may reflect sequela of prior osteitis pubis, nonspecific. Enthesopathic changes present upon the ischial tuberosity and greater trochanter. No acute osseous abnormality of the included portion of the right hand incidentally included within the level of imaging. Ligaments Suboptimally assessed by CT. Muscles and Tendons Fatty atrophy of the gluteus medius and minimus muscle bellies. Milder atrophy of the gluteus maximus as well. Thickening and edematous changes are seen near the insertions upon the greater trochanter as well. No musculotendinous  discontinuity. No intramuscular hematoma or collection. Soft tissues Mild contusive changes of the lateral right hip. Included portions of the pelvis reveals colonic diverticulosis. No acute evidence of diverticulitis. Mild circumferential bladder wall thickening and perivesicular hazy stranding. Postsurgical changes along the low anterior pelvis. Atherosclerotic calcification of the proximal inflow and outflow vessels. IMPRESSION: 1. Impacted subcapital right femoral neck fracture with few displaced comminuted fracture fragments contained by the joint capsule. 2. Small to moderate right hip effusion. 3. Thickening and edema near insertions of the gluteal musculature upon the greater trochanter and mildly within the origin of the vastus lateralis as well. 4. Chronic appearing fatty atrophy of the gluteus medius and  minimus and with milder change within the gluteus maximus. 5. Mild circumferential bladder wall thickening and perivesicular hazy stranding. Correlate with urinalysis to exclude cystitis. 6. Colonic diverticulosis without evidence of diverticulitis. 7. Aortic Atherosclerosis (ICD10-I70.0). Electronically Signed   By: Lovena Le M.D.   On: 12/08/2019 01:57   DG Chest Port 1 View  Result Date: 12/07/2019 CLINICAL DATA:  [place] 4 days ago, hip fracture, preoperative evaluation EXAM: PORTABLE CHEST 1 VIEW COMPARISON:  07/05/2017 FINDINGS: Single frontal view of the chest demonstrates a stable cardiac silhouette. Hiatal hernia again noted. No airspace disease, effusion, or pneumothorax. No acute bony abnormalities. IMPRESSION: 1. Stable hiatal hernia.  No acute intrathoracic process. Electronically Signed   By: Randa Ngo M.D.   On: 12/07/2019 18:28   DG Knee Right Port  Result Date: 12/08/2019 CLINICAL DATA:  Pain EXAM: PORTABLE RIGHT KNEE - 1-2 VIEW COMPARISON:  None. FINDINGS: There is no acute displaced fracture or dislocation. Mild tricompartmental degenerative changes are noted. Atherosclerotic  changes are noted. IMPRESSION: Negative. Electronically Signed   By: Constance Holster M.D.   On: 12/08/2019 00:11   DG C-Arm 1-60 Min-No Report  Result Date: 12/08/2019 Fluoroscopy was utilized by the requesting physician.  No radiographic interpretation.   DG HIP PORT UNILAT WITH PELVIS 1V RIGHT  Result Date: 12/08/2019 CLINICAL DATA:  Displaced fracture of the right femoral neck. EXAM: DG HIP (WITH OR WITHOUT PELVIS) 1V PORT RIGHT COMPARISON:  None. FINDINGS: Evaluation is limited by patient positioning. There is no definite acute displaced fracture or dislocation. There is a questionable mild step-off in the subcapital region of the proximal right femur. There is osteopenia. IMPRESSION: Questionable mild step-off in the subcapital region of the proximal right femur. Consider further evaluation with CT if there is high clinical suspicion for right-sided hip fracture. Electronically Signed   By: Constance Holster M.D.   On: 12/08/2019 00:08   DG HIP OPERATIVE UNILAT WITH PELVIS RIGHT  Result Date: 12/08/2019 CLINICAL DATA:  Right femoral neck fracture. Right hip hemiarthroplasty. EXAM: OPERATIVE right HIP (WITH PELVIS IF PERFORMED) 7 VIEWS TECHNIQUE: Fluoroscopic spot image(s) were submitted for interpretation post-operatively. COMPARISON:  CT scan 12/07/2016 FINDINGS: Multiple intraoperative fluoroscopic spot images demonstrate placement of a femoral Reamer and sizer followed by placement of a bipolar hip prosthesis which appears to be in good position without complicating features. IMPRESSION: Right hip hemiarthroplasty in good position without complicating features. Electronically Signed   By: Marijo Sanes M.D.   On: 12/08/2019 18:37   VAS [place] LOWER EXTREMITY VENOUS (DVT)  Result Date: 12/08/2019  Lower Venous DVTStudy Indications: Follow up previous extensive bilateral DVT. Pre-operative evaluation, right femoral neck fracture repair.  Limitations: Poor ultrasound/tissue interface and patient  position, restricted mobility. Comparison Study: 07/06/2019 right lower extremity venous duplex, 08/06/2019 left                   lower extremity venous duplex Performing Technologist: Maudry Mayhew MHA, RDMS, RVT, RDCS  Examination Guidelines: A complete evaluation includes B-mode imaging, spectral Doppler, color Doppler, and power Doppler as needed of all accessible portions of each vessel. Bilateral testing is considered an integral part of a complete examination. Limited examinations for reoccurring indications may be performed as noted. The reflux portion of the exam is performed with the patient in reverse Trendelenburg.  +---------+---------------+---------+-----------+----------+-----------------+ RIGHT    CompressibilityPhasicitySpontaneityPropertiesThrombus Aging    +---------+---------------+---------+-----------+----------+-----------------+ CFV      None  No                   Age Indeterminate +---------+---------------+---------+-----------+----------+-----------------+ SFJ      None                                         Age Indeterminate +---------+---------------+---------+-----------+----------+-----------------+ FV Prox  None                                         Age Indeterminate +---------+---------------+---------+-----------+----------+-----------------+ FV Mid   None                                         Age Indeterminate +---------+---------------+---------+-----------+----------+-----------------+ FV DistalNone                                         Age Indeterminate +---------+---------------+---------+-----------+----------+-----------------+ PFV      None                                         Age Indeterminate +---------+---------------+---------+-----------+----------+-----------------+ POP      None                    No                   Age Indeterminate  +---------+---------------+---------+-----------+----------+-----------------+ PTV      None                                         Age Indeterminate +---------+---------------+---------+-----------+----------+-----------------+ PERO     None                                         Age Indeterminate +---------+---------------+---------+-----------+----------+-----------------+   +---------+---------------+---------+-----------+--------------+---------------+ LEFT     CompressibilityPhasicitySpontaneityProperties    Thrombus Aging  +---------+---------------+---------+-----------+--------------+---------------+ CFV      None           Yes      Yes        partially     Age                                                         re-cannalized Indeterminate   +---------+---------------+---------+-----------+--------------+---------------+ SFJ      None                                             Age  Indeterminate   +---------+---------------+---------+-----------+--------------+---------------+ FV Prox  None                                             Age                                                                       Indeterminate   +---------+---------------+---------+-----------+--------------+---------------+ FV Mid   None                                             Age                                                                       Indeterminate   +---------+---------------+---------+-----------+--------------+---------------+ FV DistalNone                                             Age                                                                       Indeterminate   +---------+---------------+---------+-----------+--------------+---------------+ PFV      None                                             Age                                                                        Indeterminate   +---------+---------------+---------+-----------+--------------+---------------+   Left Technical Findings: Not visualized segments include popliteal vein, PTV, peroneal veins.   Summary: RIGHT: - Findings consistent with age indeterminate deep vein thrombosis involving the right common femoral vein, SF junction, right femoral vein, right proximal profunda vein, right popliteal vein, right posterior tibial veins, and right peroneal veins.  LEFT: - Findings consistent with age indeterminate deep vein thrombosis involving the left common femoral vein, SF junction, left femoral vein, and left proximal profunda vein. Unable to adequately visualize popliteal, posterior tibial, and peroneal veins; therefore cannot exclude DVT in these segments.  When compared to  prior studies, there is no significant resolution of DVT seen bilaterally, with the exception of little resolution of the left common femoral vein, given partially recannalized vein.  *See table(s) above for measurements and observations. Electronically signed by Monica Martinez MD on 12/08/2019 at 3:54:22 PM.    Final     Scheduled Meds: . docusate sodium  100 mg Oral BID  . donepezil  10 mg Oral QHS  . enoxaparin (LOVENOX) injection  40 mg Subcutaneous Q24H  . feeding supplement  1 Container Oral Q24H  . feeding supplement (ENSURE ENLIVE)  237 mL Oral BID BM  . iron polysaccharides  150 mg Oral BID  . melatonin  10 mg Oral QHS  . memantine  10 mg Oral BID  . metoprolol tartrate  25 mg Oral BID  . multivitamin with minerals  1 tablet Oral Daily  . vitamin B-12  1,000 mcg Oral Daily   Continuous Infusions:    LOS: 2 days   Time spent: 25 minutes.  Patrecia Pour, MD [facility] [email or web address] 12/09/2019, 3:23 PM

## 2019-12-09 NOTE — Progress Notes (Signed)
Physical Therapy Treatment Patient Details Name: Wendy Krueger MRN: [id number] DOB: 09-Dec-1929 Today's Date: 12/09/2019    History of Present Illness 86 y.o. 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 She was admitted 6/8 for operative repair and s/p Right hip hemiarthroplasty with direct anterior approach on 6/9    PT Comments    Pt on BSC on arrival and ambulated within room only due to more pain this afternoon.  Pt assisted back to bed and repositioned to comfort.  SCDs reapplied.    Follow Up Recommendations  Home health PT;Supervision/Assistance - 24 hour     Equipment Recommendations  Hospital bed    Recommendations for Other Services       Precautions / Restrictions Precautions Precautions: Fall Restrictions RLE Weight Bearing: Weight bearing as tolerated    Mobility  Bed Mobility Overal bed mobility: Needs Assistance Bed Mobility: Sit to Supine     Supine to sit: HOB elevated;Mod assist Sit to supine: Min assist   General bed mobility comments: assist for R LE  Transfers Overall transfer level: Needs assistance Equipment used: Rolling walker (2 wheeled) Transfers: Sit to/from Stand Sit to Stand: Min assist         General transfer comment: multimodal cues for positioning and weight shift; light assist to rise and steady from [facility] (on BSC on arrival)  Ambulation/Gait Ambulation/Gait assistance: Min assist Gait Distance (Feet): 15 Feet Assistive device: Rolling walker (2 wheeled) Gait Pattern/deviations: Step-to pattern;Decreased stance time - right;Antalgic     General Gait Details: verbal cues for sequence, RW positioning, pt tends to say "I can't" but is able, remained in room as pt with increased pain this afternoon  (daughter states no pain meds since this morning and requested more so RN notified)   Stairs             Wheelchair Mobility    Modified Rankin (Stroke Patients Only)       Balance Overall balance assessment: History of Falls                                          Cognition Arousal/Alertness: Awake/alert Behavior During Therapy: WFL for tasks assessed/performed Overall Cognitive Status: History of cognitive impairments - at baseline                                        Exercises      General Comments        Pertinent Vitals/Pain Pain Assessment: Faces Faces Pain Scale: Hurts whole lot Pain Location: right hip Pain Descriptors / Indicators: Guarding;Grimacing;Sore Pain Intervention(s): Repositioned;Monitored during session;Patient requesting pain meds-RN notified    Home Living Family/patient expects to be discharged to:: Private residence Living Arrangements: Children Available Help at Discharge: Family;Available 24 hours/day;Personal care attendant Type of Home: House Home Access: Ramped entrance   Home Layout: One level Home Equipment: [place] - 2 wheels;Cane - single point;Bedside commode;Shower seat;Grab bars - tub/shower;Hand held shower head;Transport chair;Grab bars - toilet Additional Comments: bed rail; bed alarm with baby monitor; lift chair (sleeps in bed)    Prior  Function Level of Independence: Needs assistance  Gait / Transfers Assistance Needed: Ambulates with RW household distances (fluctuates with distance) with assist from family (uses gait belt) ADL's / Homemaking Assistance Needed: Assist for bathing and dressing and ADL's Comments: 24/7 assist; HHPT 1x/week; nurse 1x/week; caregiver 6 days per week for 5 hours/day   PT Goals (current goals can now be found in the care plan section) Acute Rehab PT Goals Patient Stated Goal: home with 24/7 care PT Goal Formulation: With patient/family Time For Goal  Achievement: 12/16/19 Potential to Achieve Goals: Good Progress towards PT goals: Progressing toward goals    Frequency    Min 5X/week      PT Plan Current plan remains appropriate    Co-evaluation              AM-PAC PT "6 Clicks" Mobility   Outcome Measure  Help needed turning from your back to your side while in a flat bed without using bedrails?: A Little Help needed moving from lying on your back to sitting on the side of a flat bed without using bedrails?: A Little Help needed moving to and from a bed to a chair (including a wheelchair)?: A Little Help needed standing up from a chair using your arms (e.g., wheelchair or bedside chair)?: A Little Help needed to walk in hospital room?: A Little Help needed climbing 3-5 steps with a railing? : Total 6 Click Score: 16    End of Session Equipment Utilized During Treatment: Gait belt Activity Tolerance: Patient tolerated treatment well Patient left: with call bell/phone within reach;with family/visitor present;in bed;with bed alarm set;with SCD's reapplied Nurse Communication: Mobility status PT Visit Diagnosis: Difficulty in walking, not elsewhere classified (R26.2)     Time: 9774-1423 PT Time Calculation (min) (ACUTE ONLY): 11 min  Charges:  $Gait Training: 8-22 mins                    Arlyce Dice, DPT Acute Rehabilitation Services Pager: [phone] Office: [place] 12/09/2019, 3:35 PM

## 2019-12-09 NOTE — TOC Initial Note (Addendum)
Transition of Care [facility]) - Initial/Assessment Note    Patient Details  Name: Wendy Krueger MRN: [id number] Date of Birth: 08-25-1929  [facility] CM/SW Contact:    Lia Hopping, [place] Phone Number: 12/09/2019, 11:23 AM  Clinical Narrative:                 Patient daughter [name] has requested to take the patient home. She reports the patient is active with [facility] for PT/OT/RN and  palliative care services.  She has personal care givers 6 days a week at 5 hours a day. The remainder of the day the patient is with family. She has 24/7 care and supervision. The patient has DME, RW, lift chair, grab bars and shower chair. Patient daughter is requesting to rent a hospital bed. CSW notified the physician.  CSW placed order with [employer].  Daughter has requested to transport the patient by car.  Expected Discharge Plan: [place] Barriers to Discharge: Continued Medical Work up   Patient Goals and CMS Choice Patient states their goals for this hospitalization and ongoing recovery are:: return home with home health therapy and personal care services. CMS Medicare.gov Compare Post Acute Care list provided to:: Patient Choice offered to / list presented to : Patient  Expected Discharge Plan and Services Expected Discharge Plan: [place] In-house Referral: Clinical Social Work Discharge Planning Services: CM Consult Post Acute Care Choice: [place] arrangements for the past 2 months: [place]                 DME Arranged: Hospital bed DME Agency: [facility] Date DME Agency Contacted: 12/09/19 Time DME Agency Contacted: 1117 Representative spoke with at DME Agency: [place] Arranged: PT, OT, RN [place] Agency: [place] ([place]) Date [place]: 12/09/19 Time [place]: 1120 Representative spoke with at [place] Arrangements/Services Living  arrangements for the past 2 months: [place] with:: Self Patient language and need for interpreter reviewed:: No Do you feel safe going back to the place where you live?: Yes      Need for Family Participation in Patient Care: Yes (Comment) Care giver support system in place?: Yes (comment) Current home services: DME, Other (comment) ([facility].) Criminal Activity/Legal Involvement Pertinent to Current Situation/Hospitalization: No - Comment as needed  Activities of Daily Living Home Assistive Devices/Equipment: Walker (specify type) ADL Screening (condition at time of admission) Patient's cognitive ability adequate to safely complete daily activities?: No Is the patient deaf or have difficulty hearing?: No Does the patient have difficulty seeing, even when wearing glasses/contacts?: No Does the patient have difficulty concentrating, remembering, or making decisions?: Yes Patient able to express need for assistance with ADLs?: Yes Does the patient have difficulty dressing or bathing?: Yes Independently performs ADLs?: No Communication: Independent Dressing (OT): Needs assistance Is this a change from baseline?: Pre-admission baseline Grooming: Needs assistance Is this a change from baseline?: Pre-admission baseline Feeding: Needs assistance Is this a change from baseline?: Pre-admission baseline Bathing: Needs assistance Is this a change from baseline?: Pre-admission baseline Toileting: Needs assistance Is this a change from baseline?: Pre-admission baseline In/Out Bed: Needs assistance Is this a change from baseline?: Pre-admission baseline Walks in Home: Needs assistance Is this a change from baseline?: Pre-admission baseline Does the patient have difficulty walking or climbing stairs?: Yes Weakness of Legs: Right Weakness of Arms/Hands: None  Permission  Sought/Granted Permission sought to share information with : Case Manager Permission granted  to share information with : Yes, Verbal Permission Granted  Share Information with NAME: McMath,Brenda Daughter  Permission granted to share info w AGENCY: dme company, home health  Permission granted to share info w Relationship: daughter  Permission granted to share info w Contact Information: [phone]  [phone]  Emotional Assessment Appearance:: Appears stated age Attitude/Demeanor/Rapport: Gracious Affect (typically observed): Accepting Orientation: : Oriented to Self Alcohol / Substance Use: Not Applicable Psych Involvement: No (comment)  Admission diagnosis:  Closed right hip fracture ([place]) [S72.001A] Closed fracture of right hip, initial encounter ([place]) [S72.001A] Displaced fracture of right femoral neck ([place]) [S72.001A] Patient Active Problem List   Diagnosis Date Noted  . Closed right hip fracture ([place]) 12/07/2019  . Pedal edema 08/13/2019  . Polyp of colon   . Gastrointestinal hemorrhage   . Chronic renal failure, stage 3b   . DVT of lower extremity, bilateral ([place]) 08/06/2019  . Bleeding hemorrhoids 08/06/2019  . DVT, lower extremity, distal, acute, bilateral ([place]) 08/06/2019  . Advanced dementia ([place]) 08/02/2019  . DVT (deep venous thrombosis) ([place]) 07/13/2019  . Abnormal CT of the abdomen   . External hemorrhoids   . Lower GI bleed 07/06/2019  . Transient alteration of awareness 07/14/2018  . Chronic diarrhea 02/17/2018  . History of fracture of radius 02/17/2018  . Hypokalemia 02/09/2018  . Hypotension 02/09/2018  . History of lower GI bleeding 02/09/2018  . Acute blood loss anemia 02/09/2018  . Acute encephalopathy 05/08/2017  . History of CVA (cerebrovascular accident) 03/10/2016  . Carotid aneurysm, right ([place]) 02/21/2016  . Lipoma of shoulder 03/27/2015  . History of closed Colles' fracture 09/06/2014  . Allergic rhinitis 06/20/2014  . Diastolic dysfunction 53/64/6803  . Iron deficiency anemia 03/25/2014  . History of GI bleed 03/21/2014  .  BRBPR (bright red blood per rectum) 03/19/2014  . Dementia ([place]) 09/01/2013  . Anxiety 09/01/2013  . Encounter for Medicare annual wellness exam 01/08/2013  . Gallstones 11/11/2011  . 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 Risk for falls 06/04/2011  . Constipation 03/05/2011  . Vitamin D deficiency 03/28/2009  . HYPERCHOLESTEROLEMIA, PURE 03/17/2007  . Essential hypertension 03/16/2007  . OSTEOARTHRITIS 03/16/2007  . Osteoporosis 03/16/2007   PCP:  Abner Greenspan, MD Pharmacy:   [id number] - [place], [place] [address] [place] [address] Phone: [phone] Fax: [phone]     Social Determinants of Health (SDOH) Interventions    Readmission Risk Interventions Readmission Risk Prevention Plan 07/07/2019  Post Dischage Appt Not Complete  Appt Comments pending stability  Medication Screening Complete  Transportation Screening Complete  Some recent data might be hidden

## 2019-12-09 NOTE — Evaluation (Signed)
Physical Therapy Evaluation Patient Details Name: Wendy Krueger MRN: [id number] DOB: March 03, 1930 Today's Date: 12/09/2019   History of Present Illness  86 y.o. 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 She was admitted 6/8 for operative repair and s/p Right hip hemiarthroplasty with direct anterior approach on 6/9  Clinical Impression  Patient is s/p above surgery resulting in functional limitations due to the deficits listed below (see PT Problem List).  Patient will benefit from skilled PT to increase their independence and safety with mobility to allow discharge to the venue listed below.  Pt assisted with ambulating short distance and currently requiring min-mod assist.  Daughter present and reports pt has 24/7 care at home and caregivers typically use gait belt when pt ambulating (however were not at the time of the fall per daughter).  Daughter prefers pt d/c home, and pt does have 24/7 care and alarms at home.  Daughter would like hospital bed for home until pt is able to use her own bed again (has some height).     Follow Up Recommendations Home health PT;Supervision/Assistance - 24 hour    Equipment Recommendations  Hospital bed    Recommendations for Other Services       Precautions / Restrictions Precautions Precautions: Fall Restrictions Weight Bearing Restrictions: No RLE Weight Bearing: Weight bearing as tolerated      Mobility  Bed Mobility Overal bed mobility: Needs Assistance Bed Mobility: Supine to Sit     Supine to sit: HOB elevated;Mod assist     General bed mobility comments: pt initiating however requiring assist for Right LE due to pain and scooting to EOB  Transfers Overall transfer level: Needs assistance Equipment used:  Rolling walker (2 wheeled) Transfers: Sit to/from Stand Sit to Stand: Min assist         General transfer comment: multimodal cues for positioning and weight shift; assist to rise and steady  Ambulation/Gait Ambulation/Gait assistance: +2 safety/equipment;Min assist Gait Distance (Feet): 12 Feet Assistive device: Rolling walker (2 wheeled) Gait Pattern/deviations: Step-to pattern;Decreased stance time - right;Antalgic     General Gait Details: verbal cues for sequence, RW positioning, pt tends to say "I can't" but able to ambulate 12 feet with RW, recliner following for safety; daughter present and also encouraging  Stairs            Wheelchair Mobility    Modified Rankin (Stroke Patients Only)       Balance Overall balance assessment: History of Falls                                           Pertinent Vitals/Pain Pain Assessment: Faces Faces Pain Scale: Hurts even more Pain Location: right hip Pain Descriptors / Indicators: Guarding;Grimacing;Sore Pain Intervention(s): Repositioned;Monitored during session;Ice applied    Home Living Family/patient expects to be discharged to:: Private residence Living Arrangements: Children Available Help at Discharge: Family;Available 24 hours/day;Personal care attendant Type of Home: House Home Access: Ramped entrance     Home Layout: One level Home Equipment: [place] - 2 wheels;Cane - single point;Bedside commode;Shower seat;Grab bars - tub/shower;Hand held shower head;Transport chair;Grab bars - toilet Additional Comments: bed rail; bed alarm with baby  monitor; lift chair (sleeps in bed)    Prior Function Level of Independence: Needs assistance   Gait / Transfers Assistance Needed: Ambulates with RW household distances (fluctuates with distance) with assist from family (uses gait belt)  ADL's / Homemaking Assistance Needed: Assist for bathing and dressing and ADL's  Comments: 24/7 assist; HHPT  1x/week; nurse 1x/week; caregiver 6 days per week for 5 hours/day     Hand Dominance        Extremity/Trunk Assessment   Upper Extremity Assessment Upper Extremity Assessment: Difficult to assess due to impaired cognition    Lower Extremity Assessment Lower Extremity Assessment: RLE deficits/detail RLE Deficits / Details: requiring assist for movement       Communication   Communication: No difficulties  Cognition Arousal/Alertness: Awake/alert Behavior During Therapy: WFL for tasks assessed/performed Overall Cognitive Status: History of cognitive impairments - at baseline                                        General Comments      Exercises     Assessment/Plan    PT Assessment Patient needs continued PT services  PT Problem List Decreased strength;Decreased mobility;Decreased activity tolerance;Decreased balance;Decreased knowledge of use of DME;Pain;Decreased range of motion;Decreased knowledge of precautions;Decreased safety awareness       PT Treatment Interventions DME instruction;Gait training;Therapeutic exercise;Therapeutic activities;Patient/family education;Functional mobility training;Balance training    PT Goals (Current goals can be found in the Care Plan section)  Acute Rehab PT Goals Patient Stated Goal: home with 24/7 care PT Goal Formulation: With patient/family Time For Goal Achievement: 12/16/19 Potential to Achieve Goals: Good    Frequency Min 5X/week   Barriers to discharge        Co-evaluation               AM-PAC PT "6 Clicks" Mobility  Outcome Measure Help needed turning from your back to your side while in a flat bed without using bedrails?: A Little Help needed moving from lying on your back to sitting on the side of a flat bed without using bedrails?: A Lot Help needed moving to and from a bed to a chair (including a wheelchair)?: A Little Help needed standing up from a chair using your arms (e.g.,  wheelchair or bedside chair)?: A Little Help needed to walk in hospital room?: A Little Help needed climbing 3-5 steps with a railing? : Total 6 Click Score: 15    End of Session Equipment Utilized During Treatment: Gait belt Activity Tolerance: Patient tolerated treatment well Patient left: in chair;with call bell/phone within reach;with chair alarm set;with family/visitor present;with SCD's reapplied Nurse Communication: Mobility status PT Visit Diagnosis: Difficulty in walking, not elsewhere classified (R26.2)    Time: 0174-9449 PT Time Calculation (min) (ACUTE ONLY): 23 min   Charges:   PT Evaluation $PT Eval Low Complexity: 1 Low        Kati PT, DPT Acute Rehabilitation Services Pager: [phone] Office: [place] E 12/09/2019, 12:16 PM

## 2019-12-09 NOTE — Care Management (Signed)
    Durable Medical Equipment  (From admission, onward)         Start     Ordered   12/09/19 1125  For home use only DME Hospital bed  Once       Question Answer Comment  Length of Need 6 Months   Patient has (list medical condition): Hip fracture   The above medical condition requires: Patient requires the ability to reposition frequently   Bed type Semi-electric      12/09/19 1125

## 2019-12-09 NOTE — Plan of Care (Signed)

## 2019-12-10 LAB — CBC
HCT: 31.1 % — ABNORMAL LOW (ref 36.0–46.0)
Hemoglobin: 9.3 g/dL — ABNORMAL LOW (ref 12.0–15.0)
MCH: 28.3 pg (ref 26.0–34.0)
MCHC: 29.9 g/dL — ABNORMAL LOW (ref 30.0–36.0)
MCV: 94.5 fL (ref 80.0–100.0)
Platelets: 149 10*3/uL — ABNORMAL LOW (ref 150–400)
RBC: 3.29 MIL/uL — ABNORMAL LOW (ref 3.87–5.11)
RDW: 17 % — ABNORMAL HIGH (ref 11.5–15.5)
WBC: 5.9 10*3/uL (ref 4.0–10.5)
nRBC: 0 % (ref 0.0–0.2)

## 2019-12-10 LAB — BASIC METABOLIC PANEL
Anion gap: 9 (ref 5–15)
BUN: 26 mg/dL — ABNORMAL HIGH (ref 8–23)
CO2: 25 mmol/L (ref 22–32)
Calcium: 8.5 mg/dL — ABNORMAL LOW (ref 8.9–10.3)
Chloride: 103 mmol/L (ref 98–111)
Creatinine, Ser: 0.94 mg/dL (ref 0.44–1.00)
GFR calc Af Amer: >60 mL/min (ref >60)
GFR calc non Af Amer: 54 mL/min — ABNORMAL LOW (ref >60)
Glucose, Bld: 86 mg/dL (ref 70–99)
Potassium: 4 mmol/L (ref 3.5–5.1)
Sodium: 137 mmol/L (ref 135–145)

## 2019-12-10 MED ORDER — HYDROCODONE-ACETAMINOPHEN 5-325 MG PO TABS: 1.0000 | ORAL_TABLET | Freq: Four times a day (QID) | ORAL | 0 refills | Status: AC | PRN

## 2019-12-10 MED ORDER — ASPIRIN EC 81 MG PO TBEC
81.0000 mg | DELAYED_RELEASE_TABLET | Freq: Two times a day (BID) | ORAL | 0 refills | Status: AC
Start: 2019-12-10 — End: 2020-01-09

## 2019-12-10 NOTE — Plan of Care (Signed)
Plan of care reviewed and discussed with family.

## 2019-12-10 NOTE — Discharge Summary (Signed)
Physician Discharge Summary  Wendy Krueger:[id number] DOB: 16-Oct-1929 DOA: 12/07/2019  PCP: Abner Greenspan, MD  Admit date: 12/07/2019 Discharge date: 12/10/2019  Admitted From: Home Disposition: Home   Recommendations for Outpatient Follow-up:  1. Follow up with PCP in 1-2 weeks 2. Please obtain CBC at follow up 3. Follow up with orthopedic surgery in 2 weeks  Home Health: PT, OT, RN Equipment/Devices: Hospital bed Discharge Condition: Stable CODE STATUS: DNR Diet recommendation: Heart healthy  Brief/Interim Summary: Wendy Krueger is an 86 y.o. 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 She was admitted 6/8 for operative repair 6/9 with anterior approach THA by Dr. Lyla Glassing. U/S confirmed presence of DVTs and in this setting post-orthopedic surgery, risks and benefits were discussed extensively of initiating some measure for prevention of clot propagation. We have decided on aspirin 81mg  po twice daily until follow up with instructions to stop and seek medical attention in the event bleeding is noted. The patient's family has arranged 24/7 assistance at home and opt for the patient to go home at discharge.   Discharge Diagnoses:  Principal Problem:   Closed right hip fracture ([place]) Active Problems:   Essential hypertension   Dementia (HCC)  Closed right hip fracture:  - Operative management per orthopedics. WBAT w/walker, continue home health rehabilitation, ASA 81mg po BID, TED hose, po pain control is adequate without sedation.  - DC home with hospital bed/DME and home health therapies.  Stage IIIb CKD: Stable-to-improved.  - Avoid nephrotoxins  Iron deficiency anemia:  - Monitor blood counts at follow up. Hgb 9.3g/dl in the absence of active  GI or surgical site bleeding.  - Continue iron  History of covid-19 infection: No isolation needed at this time.   HTN:  - Continue home medications  History of DVT s/p IVC filter:  - Has such severe history of hemorrhoidal bleeding, anticoagulation has not been given. Confirmed DVT on U/S here. After exhaustive discussions with the family regarding risk of life-threatening clot propagation vs. risk of recurrent bleeding (none currently noted), we have recommended BID aspirin to which the family consents.  Dementia:  - Continue home medications.  - Spoke with daughters at length   Discharge Instructions Discharge Instructions    Diet - low sodium heart healthy   Complete by: As directed    Discharge instructions   Complete by: As directed    Take aspirin 81mg  twice daily to reduce risk of blood clot extension. Make sure to get up as much as possible to bear weight as tolerated. Aspirin is not the typical recommendation for blood clots, but this is felt to be a compromise between this risk and the risk of bleeding. If you do notice bleeding, stop aspirin and seek medical attention right away  Follow up with your PCP in the next 1-2 weeks for repeat blood count check  Follow up with orthopedics, Dr. Lyla Glassing in 2 weeks for postoperative recheck. Keep the wound dressing in place until that time. Call for advice if you notice redness, increasing pain or discharge from the site.   Increase activity slowly   Complete by: As directed    Leave dressing on - Keep it clean, dry, and intact until clinic visit   Complete by: As directed  Allergies as of 12/10/2019      Reactions   Alendronate Sodium Other (See Comments)   Leg pain    Amlodipine Besylate Hives   Calcitonin (salmon) Other (See Comments)    headache/ head pressure   Plavix [clopidogrel Bisulfate] Other (See Comments)   drowsy     Simvastatin Other (See Comments)   Leg pain       Medication List    TAKE these  medications   acetaminophen 500 MG tablet Commonly known as: TYLENOL Take 500 mg by mouth every 6 (six) hours as needed.   ALLERGY PO Take 1 tablet by mouth daily.   aspirin EC 81 MG tablet Take 1 tablet (81 mg total) by mouth 2 (two) times daily. Swallow whole.   [employer] SPECTRAVITE PO Take 1 capsule by mouth daily.   donepezil 10 MG tablet Commonly known as: ARICEPT Take 1 tablet (10 mg total) by mouth at bedtime.   HYDROcodone-acetaminophen 5-325 MG tablet Commonly known as: NORCO/VICODIN Take 1-2 tablets by mouth every 6 (six) hours as needed for up to 5 days for moderate pain or severe pain.   iron polysaccharides 150 MG capsule Commonly known as: NIFEREX Take 1 capsule (150 mg total) by mouth 2 (two) times daily.   Melatonin 10 MG Tabs Take 10 mg by mouth at bedtime.   memantine 10 MG tablet Commonly known as: NAMENDA Take 1 tablet (10 mg total) by mouth 2 (two) times daily.   metoprolol tartrate 25 MG tablet Commonly known as: LOPRESSOR Take 25 mg by mouth 2 (two) times daily.   polyethylene glycol 17 g packet Commonly known as: MIRALAX / GLYCOLAX Take 17 g by mouth daily. What changed:   when to take this  reasons to take this   potassium chloride 10 MEQ tablet Commonly known as: Klor-Con M10 Take 2 tablets (20 mEq total) by mouth every evening. What changed:   how much to take  when to take this   PROBIOTIC PO Take 1 tablet by mouth every other day.   vitamin B-12 1000 MCG tablet Commonly known as: CYANOCOBALAMIN Take 1,000 mcg by mouth daily.   vitamin C 1000 MG tablet Take 1,000 mg by mouth daily.            Discharge Care Instructions  (From admission, onward)         Start     Ordered   12/10/19 0000  Leave dressing on - Keep it clean, dry, and intact until clinic visit        12/10/19 0903          Follow-up Information    [place], Aaron Edelman, MD. Schedule an appointment as soon as possible for a visit in 2 weeks.   Specialty:  Orthopedic Surgery Why: For wound re-check Contact information: [address] [place] [address] [phone]        Abner Greenspan, MD. Schedule an appointment as soon as possible for a visit in 1 week(s).   Specialties: Family Medicine, Radiology Contact information: [place] [address] [phone]              Allergies  Allergen Reactions  . Alendronate Sodium Other (See Comments)    Leg pain   . Amlodipine Besylate Hives  . Calcitonin ([place]) Other (See Comments)     headache/ head pressure  . Plavix [Clopidogrel Bisulfate] Other (See Comments)    drowsy    . Simvastatin Other (See Comments)  Leg pain     Consultations:  Orthopedics  Procedures/Studies: Pelvis Portable  Result Date: 12/08/2019 CLINICAL DATA:  Postop right hip replacement. EXAM: PORTABLE PELVIS 1-2 VIEWS COMPARISON:  Radiographs yesterday. FINDINGS: Right hip arthroplasty in expected alignment. No periprosthetic lucency. Recent postsurgical change includes air and edema in the soft tissues. IMPRESSION: Right hip arthroplasty without immediate postoperative complication. Electronically Signed   By: Keith Rake M.D.   On: 12/08/2019 17:59   CT HIP RIGHT WO CONTRAST  Result Date: 12/08/2019 CLINICAL DATA:  Fall, abnormal x-ray EXAM: CT OF THE RIGHT HIP WITHOUT CONTRAST TECHNIQUE: Multidetector CT imaging of the right hip was performed according to the standard protocol. Multiplanar CT image reconstructions were also generated. COMPARISON:  Radiograph 12/08/2019, radiograph 02/17/2018, CT abdomen pelvis 07/05/2019 FINDINGS: Bones/Joint/Cartilage Impacted subcapital right femoral neck fracture with few displaced comminuted fracture fragments contained by the joint capsule. Small hip joint effusion is present. No other acute fracture is seen of the proximal right femur or included portions of the bony pelvis. Femoral head remains normally located.  Background of mild degenerative change in the right hip with some periacetabular spurring. Sclerotic changes the and thickening at the pubic symphysis may reflect sequela of prior osteitis pubis, nonspecific. Enthesopathic changes present upon the ischial tuberosity and greater trochanter. No acute osseous abnormality of the included portion of the right hand incidentally included within the level of imaging. Ligaments Suboptimally assessed by CT. Muscles and Tendons Fatty atrophy of the gluteus medius and minimus muscle bellies. Milder atrophy of the gluteus maximus as well. Thickening and edematous changes are seen near the insertions upon the greater trochanter as well. No musculotendinous discontinuity. No intramuscular hematoma or collection. Soft tissues Mild contusive changes of the lateral right hip. Included portions of the pelvis reveals colonic diverticulosis. No acute evidence of diverticulitis. Mild circumferential bladder wall thickening and perivesicular hazy stranding. Postsurgical changes along the low anterior pelvis. Atherosclerotic calcification of the proximal inflow and outflow vessels. IMPRESSION: 1. Impacted subcapital right femoral neck fracture with few displaced comminuted fracture fragments contained by the joint capsule. 2. Small to moderate right hip effusion. 3. Thickening and edema near insertions of the gluteal musculature upon the greater trochanter and mildly within the origin of the vastus lateralis as well. 4. Chronic appearing fatty atrophy of the gluteus medius and minimus and with milder change within the gluteus maximus. 5. Mild circumferential bladder wall thickening and perivesicular hazy stranding. Correlate with urinalysis to exclude cystitis. 6. Colonic diverticulosis without evidence of diverticulitis. 7. Aortic Atherosclerosis (ICD10-I70.0). Electronically Signed   By: Lovena Le M.D.   On: 12/08/2019 01:57   DG Chest Port 1 View  Result Date: 12/07/2019 CLINICAL  DATA:  [place] 4 days ago, hip fracture, preoperative evaluation EXAM: PORTABLE CHEST 1 VIEW COMPARISON:  07/05/2017 FINDINGS: Single frontal view of the chest demonstrates a stable cardiac silhouette. Hiatal hernia again noted. No airspace disease, effusion, or pneumothorax. No acute bony abnormalities. IMPRESSION: 1. Stable hiatal hernia.  No acute intrathoracic process. Electronically Signed   By: Randa Ngo M.D.   On: 12/07/2019 18:28   DG Knee Right Port  Result Date: 12/08/2019 CLINICAL DATA:  Pain EXAM: PORTABLE RIGHT KNEE - 1-2 VIEW COMPARISON:  None. FINDINGS: There is no acute displaced fracture or dislocation. Mild tricompartmental degenerative changes are noted. Atherosclerotic changes are noted. IMPRESSION: Negative. Electronically Signed   By: Constance Holster M.D.   On: 12/08/2019 00:11   DG C-Arm 1-60 Min-No Report  Result Date: 12/08/2019  Fluoroscopy was utilized by the requesting physician.  No radiographic interpretation.   DG HIP PORT UNILAT WITH PELVIS 1V RIGHT  Result Date: 12/08/2019 CLINICAL DATA:  Displaced fracture of the right femoral neck. EXAM: DG HIP (WITH OR WITHOUT PELVIS) 1V PORT RIGHT COMPARISON:  None. FINDINGS: Evaluation is limited by patient positioning. There is no definite acute displaced fracture or dislocation. There is a questionable mild step-off in the subcapital region of the proximal right femur. There is osteopenia. IMPRESSION: Questionable mild step-off in the subcapital region of the proximal right femur. Consider further evaluation with CT if there is high clinical suspicion for right-sided hip fracture. Electronically Signed   By: Constance Holster M.D.   On: 12/08/2019 00:08   DG HIP OPERATIVE UNILAT WITH PELVIS RIGHT  Result Date: 12/08/2019 CLINICAL DATA:  Right femoral neck fracture. Right hip hemiarthroplasty. EXAM: OPERATIVE right HIP (WITH PELVIS IF PERFORMED) 7 VIEWS TECHNIQUE: Fluoroscopic spot image(s) were submitted for interpretation  post-operatively. COMPARISON:  CT scan 12/07/2016 FINDINGS: Multiple intraoperative fluoroscopic spot images demonstrate placement of a femoral Reamer and sizer followed by placement of a bipolar hip prosthesis which appears to be in good position without complicating features. IMPRESSION: Right hip hemiarthroplasty in good position without complicating features. Electronically Signed   By: Marijo Sanes M.D.   On: 12/08/2019 18:37   VAS [place] LOWER EXTREMITY VENOUS (DVT)  Result Date: 12/08/2019  Lower Venous DVTStudy Indications: Follow up previous extensive bilateral DVT. Pre-operative evaluation, right femoral neck fracture repair.  Limitations: Poor ultrasound/tissue interface and patient position, restricted mobility. Comparison Study: 07/06/2019 right lower extremity venous duplex, 08/06/2019 left                   lower extremity venous duplex Performing Technologist: Maudry Mayhew MHA, RDMS, RVT, RDCS  Examination Guidelines: A complete evaluation includes B-mode imaging, spectral Doppler, color Doppler, and power Doppler as needed of all accessible portions of each vessel. Bilateral testing is considered an integral part of a complete examination. Limited examinations for reoccurring indications may be performed as noted. The reflux portion of the exam is performed with the patient in reverse Trendelenburg.  +---------+---------------+---------+-----------+----------+-----------------+ RIGHT    CompressibilityPhasicitySpontaneityPropertiesThrombus Aging    +---------+---------------+---------+-----------+----------+-----------------+ CFV      None                    No                   Age Indeterminate +---------+---------------+---------+-----------+----------+-----------------+ SFJ      None                                         Age Indeterminate +---------+---------------+---------+-----------+----------+-----------------+ FV Prox  None                                          Age Indeterminate +---------+---------------+---------+-----------+----------+-----------------+ FV Mid   None                                         Age Indeterminate +---------+---------------+---------+-----------+----------+-----------------+ FV DistalNone  Age Indeterminate +---------+---------------+---------+-----------+----------+-----------------+ PFV      None                                         Age Indeterminate +---------+---------------+---------+-----------+----------+-----------------+ POP      None                    No                   Age Indeterminate +---------+---------------+---------+-----------+----------+-----------------+ PTV      None                                         Age Indeterminate +---------+---------------+---------+-----------+----------+-----------------+ PERO     None                                         Age Indeterminate +---------+---------------+---------+-----------+----------+-----------------+   +---------+---------------+---------+-----------+--------------+---------------+ LEFT     CompressibilityPhasicitySpontaneityProperties    Thrombus Aging  +---------+---------------+---------+-----------+--------------+---------------+ CFV      None           Yes      Yes        partially     Age                                                         re-cannalized Indeterminate   +---------+---------------+---------+-----------+--------------+---------------+ SFJ      None                                             Age                                                                       Indeterminate   +---------+---------------+---------+-----------+--------------+---------------+ FV Prox  None                                             Age                                                                       Indeterminate    +---------+---------------+---------+-----------+--------------+---------------+ FV Mid   None  Age                                                                       Indeterminate   +---------+---------------+---------+-----------+--------------+---------------+ FV DistalNone                                             Age                                                                       Indeterminate   +---------+---------------+---------+-----------+--------------+---------------+ PFV      None                                             Age                                                                       Indeterminate   +---------+---------------+---------+-----------+--------------+---------------+   Left Technical Findings: Not visualized segments include popliteal vein, PTV, peroneal veins.   Summary: RIGHT: - Findings consistent with age indeterminate deep vein thrombosis involving the right common femoral vein, SF junction, right femoral vein, right proximal profunda vein, right popliteal vein, right posterior tibial veins, and right peroneal veins.  LEFT: - Findings consistent with age indeterminate deep vein thrombosis involving the left common femoral vein, SF junction, left femoral vein, and left proximal profunda vein. Unable to adequately visualize popliteal, posterior tibial, and peroneal veins; therefore cannot exclude DVT in these segments.  When compared to prior studies, there is no significant resolution of DVT seen bilaterally, with the exception of little resolution of the left common femoral vein, given partially recannalized vein.  *See table(s) above for measurements and observations. Electronically signed by Monica Martinez MD on 12/08/2019 at 3:54:22 PM.    Final     Subjective: Feels well, no pain. Hasn't taken pain medications since yesterday afternoon. No bleeding, no leg pain/swelling.    Discharge Exam: Vitals:   12/09/19 2146 12/10/19 0538  BP: 136/77 (!) 175/70  Pulse: 77 67  Resp: 16 16  Temp: 98 F (36.7 C) 98.7 F (37.1 C)  SpO2: 96% 100%   General: Pt is alert, awake, not in acute distress. Confused but pleasant. Cardiovascular: RRR, S1/S2 +, no rubs, no gallops Respiratory: CTA bilaterally, no wheezing, no rhonchi Abdominal: Soft, NT, ND, bowel sounds + Extremities: No edema, no cyanosis  Labs: BNP (last 3 results) No results for input(s): BNP in the last 8760 hours. Basic Metabolic Panel: Recent Labs  Lab 12/07/19 1830 12/08/19  1610 12/08/19 1827 12/09/19 0342 12/10/19 0715  NA 140 140  --  138 137  K 4.3 4.0  --  4.1 4.0  CL 106 107  --  105 103  CO2 25 24  --  21* 25  GLUCOSE 99 87  --  179* 86  BUN 21 22  --  24* 26*  CREATININE 0.88 1.01* 0.99 1.10* 0.94  CALCIUM 9.0 8.8*  --  8.6* 8.5*   Liver Function Tests: Recent Labs  Lab 12/07/19 1830  AST 41  ALT 22  ALKPHOS 123  BILITOT 0.6  PROT 6.8  ALBUMIN 3.3*   No results for input(s): LIPASE, AMYLASE in the last 168 hours. No results for input(s): AMMONIA in the last 168 hours. CBC: Recent Labs  Lab 12/07/19 1830 12/08/19 0433 12/08/19 1827 12/09/19 0342 12/10/19 0715  WBC 5.7 4.7 6.2 7.3 5.9  NEUTROABS 4.0  --   --   --   --   HGB 12.5 11.8* 11.5* 10.2* 9.3*  HCT 42.3 39.1 38.3 34.0* 31.1*  MCV 93.2 92.9 94.6 92.1 94.5  PLT 162 137* 135* 153 149*   Cardiac Enzymes: No results for input(s): CKTOTAL, CKMB, CKMBINDEX, TROPONINI in the last 168 hours. BNP: Invalid input(s): POCBNP CBG: No results for input(s): GLUCAP in the last 168 hours. D-Dimer No results for input(s): DDIMER in the last 72 hours. Hgb A1c No results for input(s): HGBA1C in the last 72 hours. Lipid Profile No results for input(s): CHOL, HDL, LDLCALC, TRIG, CHOLHDL, LDLDIRECT in the last 72 hours. Thyroid function studies No results for input(s): TSH, T4TOTAL, T3FREE, THYROIDAB in the last 72  hours.  Invalid input(s): FREET3 Anemia work up No results for input(s): VITAMINB12, FOLATE, FERRITIN, TIBC, IRON, RETICCTPCT in the last 72 hours. Urinalysis    Component Value Date/Time   COLORURINE AMBER (A) 12/07/2019 1754   APPEARANCEUR HAZY (A) 12/07/2019 1754   LABSPEC 1.026 12/07/2019 1754   PHURINE 5.0 12/07/2019 1754   GLUCOSEU NEGATIVE 12/07/2019 1754   HGBUR NEGATIVE 12/07/2019 1754   BILIRUBINUR NEGATIVE 12/07/2019 1754   BILIRUBINUR Negative 02/18/2019 1658   KETONESUR NEGATIVE 12/07/2019 1754   PROTEINUR NEGATIVE 12/07/2019 1754   UROBILINOGEN 1.0 02/18/2019 1658   UROBILINOGEN 0.2 03/20/2014 0753   NITRITE POSITIVE (A) 12/07/2019 1754   LEUKOCYTESUR MODERATE (A) 12/07/2019 1754    Microbiology Recent Results (from the past 240 hour(s))  SARS Coronavirus 2 by RT PCR (hospital order, performed in [facility] hospital lab) Nasopharyngeal Nasopharyngeal Swab     Status: Abnormal   Collection Time: 12/07/19  5:55 PM   Specimen: Nasopharyngeal Swab  Result Value Ref Range Status   SARS Coronavirus 2 POSITIVE (A) NEGATIVE Final    Comment: RESULT CALLED TO, READ BACK BY AND VERIFIED WITH: SMITH,J. RN @2038  ON 06.08.2021 BY COHEN,K (NOTE) SARS-CoV-2 target nucleic acids are DETECTED SARS-CoV-2 RNA is generally detectable in upper respiratory specimens  during the acute phase of infection.  Positive results are indicative  of the presence of the identified virus, but do not rule out bacterial infection or co-infection with other pathogens not detected by the test.  Clinical correlation with patient history and  other diagnostic information is necessary to determine patient infection status.  The expected result is negative. Fact Sheet for Patients:   StrictlyIdeas.no  Fact Sheet for Healthcare Providers:   BankingDealers.co.za   This test is not yet approved or cleared by the [place] FDA and  has been authorized  for detection and/or  diagnosis of SARS-CoV-2 by FDA under an Emergency Use Authorization (EUA).  This EUA will remain in effect (meaning this te st can be used) for the duration of  the COVID-19 declaration under Section 564(b)(1) of the Act, 21 U.S.C. section 360-bbb-3(b)(1), unless the authorization is terminated or revoked sooner. Performed at [facility], [place] [address], [place], [place] [address]   MRSA PCR Screening     Status: None   Collection Time: 12/08/19 10:25 AM   Specimen: Nasopharyngeal  Result Value Ref Range Status   MRSA by PCR NEGATIVE NEGATIVE Final    Comment:        The GeneXpert MRSA Assay (FDA approved for NASAL specimens only), is one component of a comprehensive MRSA colonization surveillance program. It is not intended to diagnose MRSA infection nor to guide or monitor treatment for MRSA infections. Performed at [facility] [place] [address] [place], [place] [address]     Time coordinating discharge: Approximately 40 minutes  Patrecia Pour, MD  [facility] 12/10/2019, 5:25 PM

## 2019-12-10 NOTE — TOC Transition Note (Signed)
Transition of Care [facility]) - CM/SW Discharge Note   Patient Details  Name: Wendy Krueger MRN: [id number] Date of Birth: 1929/07/19  Transition of Care [facility]) CM/SW Contact:  Lia Hopping, [place] Phone Number: 12/10/2019, 9:26 AM   Clinical Narrative:    [name] confirm the patient hospital bed has been delivered. She prefers to transport the patient by care. No other needs identified.   Final next level of care: [place] Barriers to Discharge: Barriers Resolved   Patient Goals and CMS Choice Patient states their goals for this hospitalization and ongoing recovery are:: return home with home health therapy and personal care services. CMS Medicare.gov Compare Post Acute Care list provided to:: Patient Choice offered to / list presented to : Patient  Discharge Placement                       Discharge Plan and Services In-house Referral: Clinical Social Work Discharge Planning Services: CM Consult Post Acute Care Choice: Home Health          DME Arranged: Hospital bed DME Agency: [employer] Date DME Agency Contacted: 12/09/19 Time DME Agency Contacted: 1117 Representative spoke with at DME Agency: [place]: PT, OT, RN [facility] Agency: [place] ([place]) Date [place]: 12/10/19 Time [place]: [phone] Representative spoke with at [place]: [place] ([place]) Interventions     Readmission Risk Interventions Readmission Risk Prevention Plan 07/07/2019  Post Dischage Appt Not Complete  Appt Comments pending stability  Medication Screening Complete  Transportation Screening Complete  Some recent data might be hidden

## 2019-12-10 NOTE — Progress Notes (Signed)
Physical Therapy Treatment Patient Details Name: Wendy Krueger MRN: [id number] DOB: 03-17-1930 Today's Date: 12/10/2019    History of Present Illness 86 y.o. 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 She was admitted 6/8 for operative repair and s/p Right hip hemiarthroplasty with direct anterior approach on 6/9    PT Comments    Pt assisted off BSC and then ambulated into hallway with recliner following for safety.  Pt to d/c home today.  Family reports hospital bed is already at the house.   Follow Up Recommendations  Home health PT;Supervision/Assistance - 24 hour     Equipment Recommendations  Hospital bed    Recommendations for Other Services       Precautions / Restrictions Precautions Precautions: Fall Restrictions RLE Weight Bearing: Weight bearing as tolerated    Mobility  Bed Mobility Overal bed mobility: Needs Assistance             General bed mobility comments: pt on BSC on arrival  Transfers Overall transfer level: Needs assistance Equipment used: Rolling walker (2 wheeled) Transfers: Sit to/from Stand Sit to Stand: Min assist         General transfer comment: multimodal cues for positioning and weight shift; light assist to rise and steady from [facility] (on BSC on arrival)  Ambulation/Gait Ambulation/Gait assistance: Min assist Gait Distance (Feet): 25 Feet Assistive device: Rolling walker (2 wheeled) Gait Pattern/deviations: Step-to pattern;Decreased stance time - right;Antalgic     General Gait Details: verbal cues for sequence, RW positioning, pt tends to say "I can't" but is able, recliner following for safety   Stairs             Wheelchair Mobility    Modified Rankin (Stroke Patients Only)        Balance                                            Cognition Arousal/Alertness: Awake/alert Behavior During Therapy: WFL for tasks assessed/performed Overall Cognitive Status: History of cognitive impairments - at baseline                                        Exercises      General Comments        Pertinent Vitals/Pain Pain Assessment: Faces Faces Pain Scale: Hurts whole lot Pain Location: right hip Pain Descriptors / Indicators: Guarding;Grimacing;Sore Pain Intervention(s): Repositioned;Ice applied;Monitored during session;Patient requesting pain meds-RN notified    Home Living                      Prior Function            PT Goals (current goals can now be found in the care plan section)      Frequency    Min 5X/week      PT Plan Current plan remains appropriate    Co-evaluation              AM-PAC PT "6 Clicks" Mobility   Outcome Measure  Help needed turning from your back to your side  while in a flat bed without using bedrails?: A Little Help needed moving from lying on your back to sitting on the side of a flat bed without using bedrails?: A Little Help needed moving to and from a bed to a chair (including a wheelchair)?: A Little Help needed standing up from a chair using your arms (e.g., wheelchair or bedside chair)?: A Little Help needed to walk in hospital room?: A Little Help needed climbing 3-5 steps with a railing? : A Lot 6 Click Score: 17    End of Session Equipment Utilized During Treatment: Gait belt Activity Tolerance: Patient tolerated treatment well Patient left: with call bell/phone within reach;with family/visitor present;with SCD's reapplied;in chair;with chair alarm set Nurse Communication: Mobility status PT Visit Diagnosis: Difficulty in walking, not elsewhere classified (R26.2)     Time: 0998-3382 PT Time Calculation (min) (ACUTE ONLY): 14 min  Charges:  $Gait Training:  8-22 mins                     Arlyce Dice, DPT Acute Rehabilitation Services Pager: [phone] Office: [phone]  York Ram E 12/10/2019, 12:32 PM

## 2019-12-10 NOTE — Plan of Care (Signed)
resolved 

## 2019-12-11 DIAGNOSIS — N1832 Chronic kidney disease, stage 3b: Secondary | ICD-10-CM | POA: Diagnosis not present

## 2019-12-11 DIAGNOSIS — I700 Atherosclerosis of aorta: Secondary | ICD-10-CM | POA: Diagnosis not present

## 2019-12-11 DIAGNOSIS — I82403 Acute embolism and thrombosis of unspecified deep veins of lower extremity, bilateral: Secondary | ICD-10-CM | POA: Diagnosis not present

## 2019-12-11 DIAGNOSIS — I959 Hypotension, unspecified: Secondary | ICD-10-CM | POA: Diagnosis not present

## 2019-12-11 DIAGNOSIS — I1310 Hypertensive heart and chronic kidney disease without heart failure, with stage 1 through stage 4 chronic kidney disease, or unspecified chronic kidney disease: Secondary | ICD-10-CM | POA: Diagnosis not present

## 2019-12-11 DIAGNOSIS — F0390 Unspecified dementia without behavioral disturbance: Secondary | ICD-10-CM | POA: Diagnosis not present

## 2019-12-12 DIAGNOSIS — I959 Hypotension, unspecified: Secondary | ICD-10-CM | POA: Diagnosis not present

## 2019-12-12 DIAGNOSIS — F0390 Unspecified dementia without behavioral disturbance: Secondary | ICD-10-CM | POA: Diagnosis not present

## 2019-12-12 DIAGNOSIS — I82403 Acute embolism and thrombosis of unspecified deep veins of lower extremity, bilateral: Secondary | ICD-10-CM | POA: Diagnosis not present

## 2019-12-12 DIAGNOSIS — I700 Atherosclerosis of aorta: Secondary | ICD-10-CM | POA: Diagnosis not present

## 2019-12-12 DIAGNOSIS — I1310 Hypertensive heart and chronic kidney disease without heart failure, with stage 1 through stage 4 chronic kidney disease, or unspecified chronic kidney disease: Secondary | ICD-10-CM | POA: Diagnosis not present

## 2019-12-12 DIAGNOSIS — N1832 Chronic kidney disease, stage 3b: Secondary | ICD-10-CM | POA: Diagnosis not present

## 2019-12-13 ENCOUNTER — Telehealth: Payer: Self-pay

## 2019-12-13 DIAGNOSIS — M19072 Primary osteoarthritis, left ankle and foot: Secondary | ICD-10-CM | POA: Diagnosis not present

## 2019-12-13 NOTE — Telephone Encounter (Signed)
Donita nurse with [facility] left v/m requesting verbal orders for [facility] nursing 2 x 1 wk for 1 wk and 1 x a wk for 3 wks.

## 2019-12-13 NOTE — Telephone Encounter (Signed)
Please ok those verbal orders  

## 2019-12-13 NOTE — Telephone Encounter (Signed)
1st attempt- Left HIPAA complaint message to return my call- need to complete TCM and schedule follow up visit.

## 2019-12-13 NOTE — Telephone Encounter (Signed)
2nd/final attempt- Left HIPAA complaint message to return my call- need to complete TCM and schedule follow up visit.

## 2019-12-13 NOTE — Telephone Encounter (Signed)
Verbal order given  

## 2019-12-14 ENCOUNTER — Telehealth: Payer: Self-pay | Admitting: Family Medicine

## 2019-12-14 DIAGNOSIS — F0390 Unspecified dementia without behavioral disturbance: Secondary | ICD-10-CM | POA: Diagnosis not present

## 2019-12-14 DIAGNOSIS — N1832 Chronic kidney disease, stage 3b: Secondary | ICD-10-CM | POA: Diagnosis not present

## 2019-12-14 DIAGNOSIS — I1310 Hypertensive heart and chronic kidney disease without heart failure, with stage 1 through stage 4 chronic kidney disease, or unspecified chronic kidney disease: Secondary | ICD-10-CM | POA: Diagnosis not present

## 2019-12-14 DIAGNOSIS — I959 Hypotension, unspecified: Secondary | ICD-10-CM | POA: Diagnosis not present

## 2019-12-14 DIAGNOSIS — I82403 Acute embolism and thrombosis of unspecified deep veins of lower extremity, bilateral: Secondary | ICD-10-CM | POA: Diagnosis not present

## 2019-12-14 DIAGNOSIS — I700 Atherosclerosis of aorta: Secondary | ICD-10-CM | POA: Diagnosis not present

## 2019-12-14 NOTE — Telephone Encounter (Signed)
My apologies, I forgot to add to the note the number to call back This has been added to visit information.  Called back and gave verbal orders.   Nothing further needed.

## 2019-12-14 NOTE — Telephone Encounter (Signed)
Please ok those verbal orders  

## 2019-12-14 NOTE — Telephone Encounter (Signed)
No phone # left in message with route to Wendy Krueger to see if she maybe has the phone # and just forgot to add it to message

## 2019-12-14 NOTE — Telephone Encounter (Signed)
Brandon with [facility] calling for verbal orders for [facility] OT 2x week x 8 weeks  Please advise, thanks.

## 2019-12-15 ENCOUNTER — Telehealth: Payer: Self-pay

## 2019-12-15 DIAGNOSIS — I1310 Hypertensive heart and chronic kidney disease without heart failure, with stage 1 through stage 4 chronic kidney disease, or unspecified chronic kidney disease: Secondary | ICD-10-CM | POA: Diagnosis not present

## 2019-12-15 DIAGNOSIS — N1832 Chronic kidney disease, stage 3b: Secondary | ICD-10-CM | POA: Diagnosis not present

## 2019-12-15 DIAGNOSIS — I700 Atherosclerosis of aorta: Secondary | ICD-10-CM | POA: Diagnosis not present

## 2019-12-15 DIAGNOSIS — I82403 Acute embolism and thrombosis of unspecified deep veins of lower extremity, bilateral: Secondary | ICD-10-CM | POA: Diagnosis not present

## 2019-12-15 DIAGNOSIS — I959 Hypotension, unspecified: Secondary | ICD-10-CM | POA: Diagnosis not present

## 2019-12-15 DIAGNOSIS — F0390 Unspecified dementia without behavioral disturbance: Secondary | ICD-10-CM | POA: Diagnosis not present

## 2019-12-15 NOTE — Telephone Encounter (Signed)
Telephone call to schedule palliative care visit.  RN left message requesting patients daughter [name] call back to schedule palliative care visit.

## 2019-12-15 NOTE — Telephone Encounter (Signed)
Patients daughter [name] returned telephone call to schedule palliative care visit.  Daughter in agreement with palliative care home visit 12/21/19 at 2:00 PM.

## 2019-12-16 DIAGNOSIS — N1832 Chronic kidney disease, stage 3b: Secondary | ICD-10-CM | POA: Diagnosis not present

## 2019-12-16 DIAGNOSIS — I1310 Hypertensive heart and chronic kidney disease without heart failure, with stage 1 through stage 4 chronic kidney disease, or unspecified chronic kidney disease: Secondary | ICD-10-CM | POA: Diagnosis not present

## 2019-12-16 DIAGNOSIS — I959 Hypotension, unspecified: Secondary | ICD-10-CM | POA: Diagnosis not present

## 2019-12-16 DIAGNOSIS — I82403 Acute embolism and thrombosis of unspecified deep veins of lower extremity, bilateral: Secondary | ICD-10-CM | POA: Diagnosis not present

## 2019-12-16 DIAGNOSIS — I700 Atherosclerosis of aorta: Secondary | ICD-10-CM | POA: Diagnosis not present

## 2019-12-16 DIAGNOSIS — F0390 Unspecified dementia without behavioral disturbance: Secondary | ICD-10-CM | POA: Diagnosis not present

## 2019-12-21 ENCOUNTER — Other Ambulatory Visit: Payer: Medicare Other

## 2019-12-21 ENCOUNTER — Other Ambulatory Visit: Payer: Self-pay

## 2019-12-21 DIAGNOSIS — I959 Hypotension, unspecified: Secondary | ICD-10-CM | POA: Diagnosis not present

## 2019-12-21 DIAGNOSIS — Z515 Encounter for palliative care: Secondary | ICD-10-CM

## 2019-12-21 DIAGNOSIS — N1832 Chronic kidney disease, stage 3b: Secondary | ICD-10-CM | POA: Diagnosis not present

## 2019-12-21 DIAGNOSIS — I1310 Hypertensive heart and chronic kidney disease without heart failure, with stage 1 through stage 4 chronic kidney disease, or unspecified chronic kidney disease: Secondary | ICD-10-CM | POA: Diagnosis not present

## 2019-12-21 DIAGNOSIS — I700 Atherosclerosis of aorta: Secondary | ICD-10-CM | POA: Diagnosis not present

## 2019-12-21 DIAGNOSIS — F0390 Unspecified dementia without behavioral disturbance: Secondary | ICD-10-CM | POA: Diagnosis not present

## 2019-12-21 DIAGNOSIS — I82403 Acute embolism and thrombosis of unspecified deep veins of lower extremity, bilateral: Secondary | ICD-10-CM | POA: Diagnosis not present

## 2019-12-21 NOTE — Progress Notes (Deleted)
   Subjective:    Patient ID: Wendy Krueger, female    DOB: 1929-08-06, 86 y.o.   MRN: [id number]  HPI    Review of Systems     Objective:   Physical Exam        Assessment & Plan:

## 2019-12-21 NOTE — Progress Notes (Deleted)
  Subjective:     Patient ID: Wendy Krueger, female   DOB: [birth year], 86 y.o.   MRN: [id number]  HPI   Review of Systems     Objective:   Physical Exam     Assessment:     ***    Plan:     ***

## 2019-12-21 NOTE — Progress Notes (Signed)
PATIENT NAME: Wendy Krueger DOB: [birth year] MRN: [id number]  PRIMARY CARE PROVIDER: Abner Greenspan, MD  RESPONSIBLE PARTY:  Acct ID - Guarantor Home Phone Work Phone Relationship Acct Type  [id number] MANNA, [id number]  Self P/F     [address], [place], [place] [address]    PLAN OF CARE and INTERVENTIONS:               1.  GOALS OF CARE/ ADVANCE CARE PLANNING:  Family wants patient to remain at home and be comfortable.                2.  PATIENT/CAREGIVER EDUCATION:  Education on fall precautions, education on s/s of infection, reviewed meds, support                3.  DISEASE STATUS: SW [place] and RN made scheduled palliative care home visit. Palliative care team met with patients daughter Dalene Seltzer and patients hired caregiver. Patient leaning back in lift chair resting when palliative care team arrived. Patient reports "all over" when nurse asked her if she is having pain. Patient suffered a fall and fractured right hip on 12-07-19 requiring surgery on 12-08-19. Caregiver reports patient was in the hospital for 2 days and returned home. Patient currently receiving Physical Therapy through [facility]. Caregiver reports [facility] continues to visit when needed. Patient was given Norco for pain however family has been administering Tylenol to patient rather than Norco.  Patient vital signs are stable. Patients appetite is good.  Caregiver reports patients aspirin was discontinued due to risk of bleeding. Caregiver assists patient with standing from the lift chair and places gait belt on patient.  Patient is able to walk 6-8 steps with walker and caregivers assist.  Patients daughter follows behind patient with wheelchair and after patient walks patient is taken to kitchen to eat lunch in wheelchair.  Patient has a brace on right foot. Patient has not had any more bleeding from hemorrhoids. Patient did test positive for CoVid last month per daughter. Patient has 2 + edema  in right lower extremity and 1+ edema in left lower extremity.  Daughter reports patient has good nights and bad nights however daughter reports patient did rest well las PM.  Nurse reviewed patient's medications with medication discharge summary that is in home. Family remains in agreement with palliative care services. Daughter and caregiver encouraged to contact palliative care with questions or concerns.    HISTORY OF PRESENT ILLNESS: Patient is a 86 year old female who resides at home.  Patient has hired caregiver with her during the day.  Daughter and grandson are with patient at night.  Patient is followed by palliative care.  Patient is seen monthly and PRN.       CODE STATUS: DNR  ADVANCED DIRECTIVES: Y MOST FORM: Yes PPS: 40%   PHYSICAL EXAM:   VITALS: Today's Vitals   12/21/19 1455  BP: 128/80  Pulse: 70  Resp: 16  Temp: 97.9 F (36.6 C)  TempSrc: Temporal  SpO2: 98%  PainSc: 4   PainLoc: Generalized    LUNGS: clear to auscultation  CARDIAC: Cor RRR  EXTREMITIES: 2+ edema right lower extremity, 1+ edema left lower extremity  SKIN: no visible open areas of skin breakdown.  Surgical wound on patients right hip covered with dressing.   NEURO: positive for gait problems, memory problems and weakness       Nilda Simmer, RN

## 2019-12-21 NOTE — Progress Notes (Deleted)
Date: 12/21/2019  MRN:  [id number] Name:  Wendy Krueger Sex:  female Age:  86 y.o. DOB:1930-03-16   [place] #:.                    Facility/Room; . Level Of Care: . Provider: .  Emergency Contacts: . Contact Information    Name Relation Home Work Christiana Daughter [phone]  [phone]   [facility] Daughter [phone]    Brantley,Betty Daughter [phone]        Code Status: . MOST Form: Marland Kitchen  Allergies: Allergies  Allergen Reactions  . Alendronate Sodium Other (See Comments)    Leg pain   . Amlodipine Besylate Hives  . Calcitonin ([place]) Other (See Comments)     headache/ head pressure  . Plavix [Clopidogrel Bisulfate] Other (See Comments)    drowsy    . Simvastatin Other (See Comments)    Leg pain      .   HPI: .  Past Medical History:  Diagnosis Date  . Asymptomatic gallstones   . BRBPR (bright red blood per rectum)   . Cataract 04/13/2018   bilateral eyes  . Cholelithiasis   . Clot    RLE blood clots; s/p IVC filter  . Colitis - presumed infectious origin    one ER visit   . Dementia ([place])   . Duodenitis   . Fall   . Gastritis   . GI bleed   . Hemorrhoid    bleeding  . Hiatal hernia   . Hyperlipidemia   . Hypertension   . Osteoarthritis   . Osteoporosis   . Stroke ([place])   . Syncope     Past Surgical History:  Procedure Laterality Date  . ABDOMINAL HYSTERECTOMY     BSO- fibroids  . ABI's     normal  . ANTERIOR APPROACH HEMI HIP ARTHROPLASTY Right 12/08/2019   Procedure: ANTERIOR APPROACH HEMI HIP ARTHROPLASTY;  Surgeon: Rod Can, MD;  Location: [facility];  Service: Orthopedics;  Laterality: Right;  . COLONOSCOPY  08/09/2019   Procedure: COLONOSCOPY;  Surgeon: Lucilla Lame, MD;  Location: [facility] ENDOSCOPY;  Service: Endoscopy;;  . dexa  1/05   osteoporosis  . ESOPHAGOGASTRODUODENOSCOPY N/A 03/21/2014   Procedure: ESOPHAGOGASTRODUODENOSCOPY (EGD);  Surgeon: Milus Banister, MD;  Location: [place];  Service: Endoscopy;  Laterality: N/A;  . FLEXIBLE SIGMOIDOSCOPY N/A 08/08/2019   Procedure: FLEXIBLE SIGMOIDOSCOPY;  Surgeon: Jonathon Bellows, MD;  Location: [facility] ENDOSCOPY;  Service: Gastroenterology;  Laterality: N/A;  . FLEXIBLE SIGMOIDOSCOPY N/A 08/09/2019   Procedure: FLEXIBLE SIGMOIDOSCOPY;  Surgeon: Lucilla Lame, MD;  Location: [facility] ENDOSCOPY;  Service: Endoscopy;  Laterality: N/A;  . IR IVC FILTER PLMT / S&I /IMG GUID/MOD SED  07/06/2019  . IVC FILTER INSERTION  07/2019  . left foot brace    . WRIST FRACTURE SURGERY  2/01   R arm  . WRIST FRACTURE SURGERY Left 02/12/2018     Procedures: . Consultants:.  Current Outpatient Medications  Medication Sig Dispense Refill  . acetaminophen (TYLENOL) 500 MG tablet Take 500 mg by mouth every 6 (six) hours as needed.    . Ascorbic Acid (VITAMIN C) 1000 MG tablet Take 1,000 mg by mouth daily.    Marland Kitchen aspirin EC 81 MG tablet Take 1 tablet (81 mg total) by mouth 2 (two) times daily. Swallow whole. 60 tablet 0  . Chlorpheniramine Maleate (ALLERGY PO) Take 1 tablet by mouth daily.     Marland Kitchen donepezil (ARICEPT) 10 MG tablet Take  1 tablet (10 mg total) by mouth at bedtime. 90 tablet 4  . iron polysaccharides (NIFEREX) 150 MG capsule Take 1 capsule (150 mg total) by mouth 2 (two) times daily. 60 capsule 5  . Melatonin 10 MG TABS Take 10 mg by mouth at bedtime.    . memantine (NAMENDA) 10 MG tablet Take 1 tablet (10 mg total) by mouth 2 (two) times daily. 180 tablet 4  . metoprolol tartrate (LOPRESSOR) 25 MG tablet Take 25 mg by mouth 2 (two) times daily.    . Multiple Vitamins-Minerals ([employer] SPECTRAVITE PO) Take 1 capsule by mouth daily.    . polyethylene glycol (MIRALAX / GLYCOLAX) 17 g packet Take 17 g by mouth daily. (Patient taking differently: Take 17 g by mouth daily as needed. ) 30 each 0  . potassium chloride (KLOR-CON M10) 10 MEQ tablet Take 2 tablets (20 mEq total) by mouth every evening. (Patient taking differently: Take 10 mEq by mouth daily.  )    . Probiotic Product (PROBIOTIC PO) Take 1 tablet by mouth every other day.     . vitamin B-12 (CYANOCOBALAMIN) 1000 MCG tablet Take 1,000 mcg by mouth daily.     No current facility-administered medications for this visit.    Immunization History  Administered Date(s) Administered  . Influenza Split 06/04/2011, 07/10/2012  . Influenza,inj,Quad PF,6+ Mos 07/12/2014, 08/09/2016, 04/09/2017, 04/17/2018, 04/19/2019  . Pneumococcal Conjugate-13 01/11/2014  . Pneumococcal Polysaccharide-23 06/04/2011  . Td 08/27/2004     Diet: .  Social History   Tobacco Use  . Smoking status: Never Smoker  . Smokeless tobacco: Never Used  Vaping Use  . Vaping Use: Never used  Substance Use Topics  . Alcohol use: No    Alcohol/week: 0.0 standard drinks  . Drug use: No    Family History  Problem Relation Age of Onset  . Heart attack Father   . Hypertension Father   . Heart attack Mother   . Throat cancer Brother   . Breast cancer Daughter      {Ros - complete:30496}  Vital signs: .  {exam, Complete:18323}  Screening Score  MMS    PHQ2    PHQ9     Fall Risk    BIMS    Annual summary: Hospitalizations:  Problem List as of 12/21/2019 Reviewed: 12/08/2019  1:33 PM by Cynda Familia, CRNA     Cardiovascular and Mediastinum   Essential hypertension   Last Assessment & Plan 08/13/2019 Office Visit Written 08/13/2019  1:19 PM by Abner Greenspan, MD    BP: 116/72  Well controlled w/o losartan and hctz right now (stopped in the hospital for low bp_  Will continue to watch and hold those medicines supp hose px written to help with edema      Arterial ischemic stroke, chronic   Last Assessment & Plan 02/17/2018 Office Visit Written 02/17/2018  1:33 PM by Abner Greenspan, MD    Was on plavix-now holding for GI bleed When stable- adv family to d/w her neurologist the pros and cons of re starting it       Carotid aneurysm, right [facility])   Last Assessment & Plan 04/19/2019 Office  Visit Written 04/19/2019  3:02 PM by Abner Greenspan, MD    No clinical changes      Hypotension   Last Assessment & Plan 08/13/2019 Office Visit Written 08/13/2019  1:51 PM by Abner Greenspan, MD    In hospital Resolved off her losartan and hctz Will continue  holding for now      External hemorrhoids   DVT (deep venous thrombosis) [facility])   Last Assessment & Plan 07/13/2019 Office Visit Edited 07/13/2019  7:48 PM by Abner Greenspan, MD    RLE extensive causing some mild foot swelling  Possibly due to immobility Pt is not candidate for anticoagulation due to GI bleeding IVC filter placed and pt doing well  Reviewed hospital records, lab results and studies in detail   Goal is to re assess possible risks of anticoag in 3-6 mo /if not, leave in place  Will ref to [facility] to re eval if desired removal      DVT of lower extremity, bilateral [facility])   Last Assessment & Plan 08/13/2019 Office Visit Written 08/13/2019  1:20 PM by Abner Greenspan, MD    Bilateral now  Reviewed hospital records, lab results and studies in detail   Newest in LLE -with some swelling Not a candidate for anticoag due to GI bleeding from hemorrhoids supp hose ordered Continue home care Elevate feet when able       Bleeding hemorrhoids   Last Assessment & Plan 08/13/2019 Office Visit Written 08/13/2019  1:22 PM by Abner Greenspan, MD    Primarily external and ? prolapsed Colonoscopy reviewed (one adenoma not bleeding) Continues anusol supp  Also keeping stools soft (disc use of miralax)  No bleeding currently  Will update if this re starts      DVT, lower extremity, distal, acute, bilateral ([place])     Respiratory   Allergic rhinitis   Last Assessment & Plan 06/20/2014 Office Visit Written 06/20/2014  7:50 PM by Abner Greenspan, MD    May be worse lately since exp to dog and xmas tree in the home  Suggest zyrtec 10 mg daily (for this and also itching/ hives) Update if not starting to improve in a week or if  worsening          Digestive   Gallstones   Last Assessment & Plan 11/11/2011 Office Visit Written 11/14/2011  4:09 PM by Abner Greenspan, MD    Incidentally seen on CT scan/ asymptomatic Disc watching fat in diet and will continue to obs No s/s of cholecystitis       BRBPR (bright red blood per rectum)   Last Assessment & Plan 07/13/2019 Office Visit Written 07/13/2019  7:11 PM by Abner Greenspan, MD    Recent GI bleed-speculating diverticular vs hemorrhoidal or both  Reviewed hospital records, lab results and studies in detail  This has now stopped and watching carefully at home  Will check cbc th or fri with HH  GI fu later this mon Continues to use anusol suppositories and also stick to soft food/bland diet       Chronic diarrhea   Last Assessment & Plan 07/13/2019 Office Visit Written 07/13/2019  7:12 PM by Abner Greenspan, MD    With recent GI bleed  Reviewed hospital records, lab results and studies in detail  Now much improved on soft/bland diet  Continue to watch        Lower GI bleed   Last Assessment & Plan 08/13/2019 Office Visit Written 08/13/2019  1:22 PM by Abner Greenspan, MD    Reviewed hospital records, lab results and studies in detail  Bled down to Hb of 7 from hemorrhoids  Rev colonoscopy-one non bleeding adenoma  Will continue to watch hemorrhoids/tx prn  Family will alert if bleeding re occurs  Polyp of colon   Last Assessment & Plan 08/13/2019 Office Visit Written 08/13/2019  1:23 PM by Abner Greenspan, MD    Adenoma Not bleeding  No recall due to age 53 surg path with pt and family today      Gastrointestinal hemorrhage     Nervous and Auditory   Dementia [facility])   Last Assessment & Plan 07/13/2019 Office Visit Written 07/13/2019  7:15 PM by Abner Greenspan, MD    Fairly stable home from the hospital- did have some encephalopathy and agitation inpt and now back to baseline at home       Acute encephalopathy   Advanced dementia [facility])      Musculoskeletal and Integument   OSTEOARTHRITIS   Last Assessment & Plan 03/05/2011 Office Visit Written 03/05/2011  9:02 AM by Abner Greenspan, MD    Ref to ortho for worsened R knee and foot pain and malfitting brace Urged to use walker at all times       Osteoporosis   Last Assessment & Plan 04/19/2019 Office Visit Written 04/19/2019  3:05 PM by Abner Greenspan, MD    With h/o distal radius fx Intol of miacalcin and bisphosphonate  Declines further eval or tx  Uses walker/wheelchair and has atc care from family to prevent falls (no falls since summer) D level today She takes ca and D      Closed right hip fracture (HCC)     Genitourinary   Chronic renal failure, stage 3b     Other   Vitamin D deficiency   Last Assessment & Plan 04/19/2019 Office Visit Written 04/19/2019  3:06 PM by Abner Greenspan, MD    In setting of OP  Level today Disc imp to bone and overall health      HYPERCHOLESTEROLEMIA, PURE   Last Assessment & Plan 04/19/2019 Office Visit Written 04/19/2019  3:05 PM by Abner Greenspan, MD    Disc goals for lipids and reasons to control them Rev last labs with pt Rev low sat fat diet in detail Labs today  Declines treatment in light of her age  Eats what she wants      Constipation   Last Assessment & Plan 03/05/2011 Office Visit Written 03/05/2011  9:04 AM by Abner Greenspan, MD    Much improved after bid colace and also prune juice rec prep H for ext hemorrhoids       Risk for falls   Last Assessment & Plan 08/15/2017 Office Visit Written 08/15/2017  1:43 PM by Abner Greenspan, MD    Long disc re: fall risk and last fall Rugs are gone and house has been addressed Ramp made for ent/exit Pt now has 24 h care by family  Has bed alarm at night to prevent both wandering and falls as well  Continues to use walker full time Family continues to work on her exercises for balance and strength as well       Encounter for Medicare annual wellness exam   Last Assessment &  Plan 01/11/2014 Office Visit Edited 01/11/2014 11:23 AM by Abner Greenspan, MD    Reviewed health habits including diet and exercise and skin cancer prevention Reviewed appropriate screening tests for age  Also reviewed health mt list, fam hx and immunization status , as well as social and family history   See HPI Dementia documented  Lab rev  Safety discussed - fall precautions  Handout given for adv dir- they are working  on one now  She will get Tdap at the health dept prevnar vaccine today      Anxiety   History of GI bleed   Last Assessment & Plan 11/03/2015 Office Visit Written 11/03/2015 11:40 AM by Abner Greenspan, MD    Pt continues bid PPI No symptoms and cbc normalized last check (no longer needs iron)       Diastolic dysfunction   Last Assessment & Plan 03/25/2014 Office Visit Written 03/27/2014 10:29 AM by Abner Greenspan, MD    Found on her echo  Non symptomatic  Will re address after GI w.u Disc symptoms to watch for Reassuring exam today      Iron deficiency anemia   Last Assessment & Plan 08/13/2019 Office Visit Written 08/13/2019  1:52 PM by Abner Greenspan, MD    Due to lower GI bleeding  1 u of prbc and iron inf in hospital Reviewed hospital records, lab results and studies in detail   Re check cbc today  No longer bleeding      History of closed Colles' fracture   Lipoma of shoulder   Last Assessment & Plan 03/27/2015 Office Visit Written 03/27/2015  8:14 PM by Abner Greenspan, MD    approx 4 cm diam soft rubbery mass over L ant shoulder resembles lipoma  Not bothersome Nl rom joint Will observe       History of CVA (cerebrovascular accident)   Last Assessment & Plan 01/20/2019 Office Visit Written 01/20/2019  8:14 PM by Abner Greenspan, MD    No few focal deficits  Continues plavix  No changes on recent head CT after a fall       Hypokalemia   History of lower GI bleeding   Last Assessment & Plan 12/31/2018 Office Visit Written 01/02/2019  7:31 PM by Tonia Ghent, MD    She has known hemorrhoids.  She is on Plavix.  The combination likely contributes to current symptoms.  Reasonable to check CBC given the intermittent bleeding.  She has a benign abdominal exam and is not in any distress.  It does not appear that given her overall situation she would need more extensive treatment right now.  Hold plavix for now.  Check CBC.  See notes on labs. Use topical hydrocortisone and nonstick bandages if needed.  I asked her family to update Dr. Glori Bickers next week.    If she continues to bleed off Plavix then she may need further evaluation/intervention. I will defer to PCP about the potential restart of Plavix in the future.  Note routed to PCP as FYI.      Acute blood loss anemia   Last Assessment & Plan 02/17/2018 Office Visit Written 02/17/2018  1:39 PM by Abner Greenspan, MD    Reviewed hospital records, lab results and studies in detail   Lower GI bleed Cbc today  Last Hb 10.1      History of fracture of radius   Last Assessment & Plan 02/17/2018 Office Visit Written 02/17/2018  1:38 PM by Abner Greenspan, MD    Continue f/u with ortho (next in 1 week) for this and other injuries  Mobility issues are issue as well  ? When safe for PT/ HH      Transient alteration of awareness   Last Assessment & Plan 07/14/2018 Office Visit Edited 07/14/2018  5:38 PM by Ria Bush, MD    New episode of night time AMS over last 2 days, previously  related to UTI. UA today with 3+ LA, no blood or nitrates. Microscopy not too suspicious for infection however. Will send UCx and treat accordingly. Check labs today for further evaluation (CBC, CMP, TSH).       Abnormal CT of the abdomen   Last Assessment & Plan 07/13/2019 Office Visit Written 07/13/2019  7:44 PM by Abner Greenspan, MD    From recent hosp Reviewed hospital records, lab results and studies in detail  2.6 cm density in colon at level of hepatic flexure - ? If stool or mass  bms are improved after GI  bleed  F/u with GI as planned to review  Colonoscopy not attempted due to risks      Pedal edema   Last Assessment & Plan 08/13/2019 Office Visit Written 08/13/2019  1:52 PM by Abner Greenspan, MD    Worse on L due to most recent DVT there (has them bilat however) Px written for supp hose to waist if tolerated Off hctz due to hypotension  Renal panel today        Infection History:  Functional assessment: Areas of potential improvement: Rehabilitation Potential: Prognosis for survival: Plan: .

## 2019-12-21 NOTE — Progress Notes (Unsigned)
SW and RN conducted home visit with patient today. Pt. daughter and caregiver was present

## 2019-12-22 DIAGNOSIS — F0390 Unspecified dementia without behavioral disturbance: Secondary | ICD-10-CM | POA: Diagnosis not present

## 2019-12-22 DIAGNOSIS — I1310 Hypertensive heart and chronic kidney disease without heart failure, with stage 1 through stage 4 chronic kidney disease, or unspecified chronic kidney disease: Secondary | ICD-10-CM | POA: Diagnosis not present

## 2019-12-22 DIAGNOSIS — N1832 Chronic kidney disease, stage 3b: Secondary | ICD-10-CM | POA: Diagnosis not present

## 2019-12-22 DIAGNOSIS — I700 Atherosclerosis of aorta: Secondary | ICD-10-CM | POA: Diagnosis not present

## 2019-12-22 DIAGNOSIS — I82403 Acute embolism and thrombosis of unspecified deep veins of lower extremity, bilateral: Secondary | ICD-10-CM | POA: Diagnosis not present

## 2019-12-22 DIAGNOSIS — I959 Hypotension, unspecified: Secondary | ICD-10-CM | POA: Diagnosis not present

## 2019-12-23 DIAGNOSIS — F0390 Unspecified dementia without behavioral disturbance: Secondary | ICD-10-CM | POA: Diagnosis not present

## 2019-12-23 DIAGNOSIS — I959 Hypotension, unspecified: Secondary | ICD-10-CM | POA: Diagnosis not present

## 2019-12-23 DIAGNOSIS — I1310 Hypertensive heart and chronic kidney disease without heart failure, with stage 1 through stage 4 chronic kidney disease, or unspecified chronic kidney disease: Secondary | ICD-10-CM | POA: Diagnosis not present

## 2019-12-23 DIAGNOSIS — I700 Atherosclerosis of aorta: Secondary | ICD-10-CM | POA: Diagnosis not present

## 2019-12-23 DIAGNOSIS — N1832 Chronic kidney disease, stage 3b: Secondary | ICD-10-CM | POA: Diagnosis not present

## 2019-12-23 DIAGNOSIS — I82403 Acute embolism and thrombosis of unspecified deep veins of lower extremity, bilateral: Secondary | ICD-10-CM | POA: Diagnosis not present

## 2019-12-24 DIAGNOSIS — F0390 Unspecified dementia without behavioral disturbance: Secondary | ICD-10-CM | POA: Diagnosis not present

## 2019-12-24 DIAGNOSIS — I959 Hypotension, unspecified: Secondary | ICD-10-CM | POA: Diagnosis not present

## 2019-12-24 DIAGNOSIS — I82403 Acute embolism and thrombosis of unspecified deep veins of lower extremity, bilateral: Secondary | ICD-10-CM | POA: Diagnosis not present

## 2019-12-24 DIAGNOSIS — I1310 Hypertensive heart and chronic kidney disease without heart failure, with stage 1 through stage 4 chronic kidney disease, or unspecified chronic kidney disease: Secondary | ICD-10-CM | POA: Diagnosis not present

## 2019-12-24 DIAGNOSIS — I700 Atherosclerosis of aorta: Secondary | ICD-10-CM | POA: Diagnosis not present

## 2019-12-24 DIAGNOSIS — N1832 Chronic kidney disease, stage 3b: Secondary | ICD-10-CM | POA: Diagnosis not present

## 2019-12-27 ENCOUNTER — Telehealth: Payer: Self-pay | Admitting: Family Medicine

## 2019-12-27 NOTE — Telephone Encounter (Signed)
Amy with [facility] left a voicemail requesting a call back regarding verbal orders.  Amy was given verbal orders for lab work several weeks ago prior to the patient going into the hospital for hip surgery - these were never obtained. She is requesting a call back to see if these are still needed - CBCD, Ferritin.  Please return call and specify if labs still needed and if so, when are they needed?

## 2019-12-27 NOTE — Telephone Encounter (Signed)
I would like to get a cbc and ferritin whenever it can be done in the next week or two please

## 2019-12-27 NOTE — Telephone Encounter (Signed)
Left VM giving Amy the verbal order on f/u labs on pt

## 2019-12-28 DIAGNOSIS — I82403 Acute embolism and thrombosis of unspecified deep veins of lower extremity, bilateral: Secondary | ICD-10-CM | POA: Diagnosis not present

## 2019-12-28 DIAGNOSIS — F0390 Unspecified dementia without behavioral disturbance: Secondary | ICD-10-CM | POA: Diagnosis not present

## 2019-12-28 DIAGNOSIS — N1832 Chronic kidney disease, stage 3b: Secondary | ICD-10-CM | POA: Diagnosis not present

## 2019-12-28 DIAGNOSIS — I1310 Hypertensive heart and chronic kidney disease without heart failure, with stage 1 through stage 4 chronic kidney disease, or unspecified chronic kidney disease: Secondary | ICD-10-CM | POA: Diagnosis not present

## 2019-12-28 DIAGNOSIS — I700 Atherosclerosis of aorta: Secondary | ICD-10-CM | POA: Diagnosis not present

## 2019-12-28 DIAGNOSIS — I959 Hypotension, unspecified: Secondary | ICD-10-CM | POA: Diagnosis not present

## 2019-12-29 ENCOUNTER — Encounter: Payer: Self-pay | Admitting: Family Medicine

## 2019-12-29 DIAGNOSIS — I959 Hypotension, unspecified: Secondary | ICD-10-CM | POA: Diagnosis not present

## 2019-12-29 DIAGNOSIS — N1832 Chronic kidney disease, stage 3b: Secondary | ICD-10-CM | POA: Diagnosis not present

## 2019-12-29 DIAGNOSIS — F0390 Unspecified dementia without behavioral disturbance: Secondary | ICD-10-CM | POA: Diagnosis not present

## 2019-12-29 DIAGNOSIS — I1310 Hypertensive heart and chronic kidney disease without heart failure, with stage 1 through stage 4 chronic kidney disease, or unspecified chronic kidney disease: Secondary | ICD-10-CM | POA: Diagnosis not present

## 2019-12-29 DIAGNOSIS — I82403 Acute embolism and thrombosis of unspecified deep veins of lower extremity, bilateral: Secondary | ICD-10-CM | POA: Diagnosis not present

## 2019-12-29 DIAGNOSIS — I700 Atherosclerosis of aorta: Secondary | ICD-10-CM | POA: Diagnosis not present

## 2019-12-29 NOTE — Telephone Encounter (Signed)
Wendy Krueger left a voicemail stating that she has collected the blood work and taking it to the lab. Wendy Krueger wants to know if you want to add a basic metabolic panel to the lab work?

## 2019-12-29 NOTE — Telephone Encounter (Signed)
If they can add it- that would be great Thanks

## 2019-12-30 DIAGNOSIS — N1832 Chronic kidney disease, stage 3b: Secondary | ICD-10-CM | POA: Diagnosis not present

## 2019-12-30 DIAGNOSIS — I1310 Hypertensive heart and chronic kidney disease without heart failure, with stage 1 through stage 4 chronic kidney disease, or unspecified chronic kidney disease: Secondary | ICD-10-CM | POA: Diagnosis not present

## 2019-12-30 DIAGNOSIS — I959 Hypotension, unspecified: Secondary | ICD-10-CM | POA: Diagnosis not present

## 2019-12-30 DIAGNOSIS — I82403 Acute embolism and thrombosis of unspecified deep veins of lower extremity, bilateral: Secondary | ICD-10-CM | POA: Diagnosis not present

## 2019-12-30 DIAGNOSIS — I700 Atherosclerosis of aorta: Secondary | ICD-10-CM | POA: Diagnosis not present

## 2019-12-30 DIAGNOSIS — F0390 Unspecified dementia without behavioral disturbance: Secondary | ICD-10-CM | POA: Diagnosis not present

## 2020-01-04 DIAGNOSIS — I700 Atherosclerosis of aorta: Secondary | ICD-10-CM | POA: Diagnosis not present

## 2020-01-04 DIAGNOSIS — I959 Hypotension, unspecified: Secondary | ICD-10-CM | POA: Diagnosis not present

## 2020-01-04 DIAGNOSIS — I1310 Hypertensive heart and chronic kidney disease without heart failure, with stage 1 through stage 4 chronic kidney disease, or unspecified chronic kidney disease: Secondary | ICD-10-CM | POA: Diagnosis not present

## 2020-01-04 DIAGNOSIS — F0390 Unspecified dementia without behavioral disturbance: Secondary | ICD-10-CM | POA: Diagnosis not present

## 2020-01-04 DIAGNOSIS — N1832 Chronic kidney disease, stage 3b: Secondary | ICD-10-CM | POA: Diagnosis not present

## 2020-01-04 DIAGNOSIS — I82403 Acute embolism and thrombosis of unspecified deep veins of lower extremity, bilateral: Secondary | ICD-10-CM | POA: Diagnosis not present

## 2020-01-05 ENCOUNTER — Telehealth: Payer: Self-pay

## 2020-01-05 ENCOUNTER — Telehealth: Payer: Self-pay | Admitting: Family Medicine

## 2020-01-05 DIAGNOSIS — I1310 Hypertensive heart and chronic kidney disease without heart failure, with stage 1 through stage 4 chronic kidney disease, or unspecified chronic kidney disease: Secondary | ICD-10-CM | POA: Diagnosis not present

## 2020-01-05 DIAGNOSIS — F0390 Unspecified dementia without behavioral disturbance: Secondary | ICD-10-CM | POA: Diagnosis not present

## 2020-01-05 DIAGNOSIS — I82403 Acute embolism and thrombosis of unspecified deep veins of lower extremity, bilateral: Secondary | ICD-10-CM | POA: Diagnosis not present

## 2020-01-05 DIAGNOSIS — I700 Atherosclerosis of aorta: Secondary | ICD-10-CM | POA: Diagnosis not present

## 2020-01-05 DIAGNOSIS — I959 Hypotension, unspecified: Secondary | ICD-10-CM | POA: Diagnosis not present

## 2020-01-05 DIAGNOSIS — N1832 Chronic kidney disease, stage 3b: Secondary | ICD-10-CM | POA: Diagnosis not present

## 2020-01-05 NOTE — Telephone Encounter (Signed)
Labs returned from home health-please let caregiver/family know  Hb is up to 10.1 (from 9.3)- so improving nicely  Iron levels are improved and her iron stores are actually above normal   If she still takes iron bid we can decrease to daily   Re check cbc and ferritin in 6-8 wk if able for iron def anemia

## 2020-01-05 NOTE — Telephone Encounter (Signed)
Patients daughter [name] returned RN's phone call.  Daughter in agreement with RN making home visit 01/07/20 at 11:00 AM.

## 2020-01-05 NOTE — Telephone Encounter (Signed)
Telephone call to patients daughter [name].  RN left message requesting call back to schedule palliative care visit.

## 2020-01-06 DIAGNOSIS — I959 Hypotension, unspecified: Secondary | ICD-10-CM | POA: Diagnosis not present

## 2020-01-06 DIAGNOSIS — I700 Atherosclerosis of aorta: Secondary | ICD-10-CM | POA: Diagnosis not present

## 2020-01-06 DIAGNOSIS — F0390 Unspecified dementia without behavioral disturbance: Secondary | ICD-10-CM | POA: Diagnosis not present

## 2020-01-06 DIAGNOSIS — I82403 Acute embolism and thrombosis of unspecified deep veins of lower extremity, bilateral: Secondary | ICD-10-CM | POA: Diagnosis not present

## 2020-01-06 DIAGNOSIS — N1832 Chronic kidney disease, stage 3b: Secondary | ICD-10-CM | POA: Diagnosis not present

## 2020-01-06 DIAGNOSIS — I1310 Hypertensive heart and chronic kidney disease without heart failure, with stage 1 through stage 4 chronic kidney disease, or unspecified chronic kidney disease: Secondary | ICD-10-CM | POA: Diagnosis not present

## 2020-01-06 NOTE — Telephone Encounter (Signed)
Daughter notified of lab results and Dr. Marliss Coots comments. Pt hasn't had anymore GI/rectal bleeding episodes.   Will f/u with [place] tomorrow to put order in to have labs rechecked

## 2020-01-07 ENCOUNTER — Other Ambulatory Visit: Payer: Self-pay

## 2020-01-07 ENCOUNTER — Other Ambulatory Visit: Payer: Medicare Other

## 2020-01-07 NOTE — Telephone Encounter (Signed)
Verbal order given to Amy to recheck labs in 6-8 weeks

## 2020-01-07 NOTE — Progress Notes (Signed)
PATIENT NAME: Wendy Krueger DOB: 1930-02-28 MRN: [id number]  PRIMARY CARE PROVIDER: Abner Greenspan, MD  RESPONSIBLE PARTY:  Acct ID - Guarantor Home Phone Work Phone Relationship Acct Type  [id number] Wendy Krueger, MENTOR442 [phone]  Self P/F     [address], [place], [place] [address]    PLAN OF CARE and INTERVENTIONS:               1.  GOALS OF CARE/ ADVANCE CARE PLANNING:  Remain at home with family and caregiver managing patients care.               2.  PATIENT/CAREGIVER EDUCATION:  Education on fall precautions, education on s/s of infection, support               3.  DISEASE STATUS: RN made scheduled palliative care nurse visit. Nurse met with patient, patients [name] and hired caregiver [name]. Patient in bed when nurse arrives. Wendy Krueger and Wendy Krueger giving patient a bed bath. Patients yells at daughter and caregiver stating they are hurting her and she does not like to be cold. Patient is cooperative with assessment and settles down quickly. Caregiver gets patient up from bed after bath is completed. Patient wearing a boot on right foot. Patient's hip fracture is healing well. Patient continues to receive Pt 2x per week. Caregiver places gait belt on patient. Patient is able to walk to kitchen table using her walker and caregiver holds onto patient with gait belt. Patient sits in wheelchair at breakfast table and eats yogurt and a bowl of cereal. Patient remains confused. Patient is not having any pain at the present time. Patient continues to take Tylenol. Family reports patient has had no bleeding from rectal hemorrhoids and bowel movements have been soft. Patient has no open areas of skin breakdown. Family reports patient eats what is put in front of her but picks out marshmallows from cereal as well as chunks of peaches that was in yogurt. Patient is able to feed herself meals. Family has two people in the home at all times until patient recoveres from right hip fracture.  Patient has  no shortness of breath. Caregiver  reports patient has an occasional cough but feels this is due to allergies. Patients current weight 145 lbs. Patient has had a 20lb weight loss over the past 2 to 3 months. Patient has trace edema in her lower extremities. Wendy Krueger states patient has had no changes in current medications. Family remains in agreement with palliative care services. Daughter and caregiver encouraged to contact palliative care with questions or concerns.         HISTORY OF PRESENT ILLNESS: Patient is a 86 year old female who resides at home.  Patient has hired caregiver in home and family assists in patients care.  Patient is followed by palliative care.  Patient is seen monthly and PRN.    CODE STATUS: DNR  ADVANCED DIRECTIVES: Y MOST FORM: Yes PPS: 40%   PHYSICAL EXAM:   VITALS: Today's Vitals   01/07/20 1415  BP: 140/90  Pulse: 88  Resp: 18  Temp: (!) 97.4 F (36.3 C)  TempSrc: Temporal  SpO2: 94%  Weight: 145 lb (65.8 kg)  PainSc: 0-No pain    LUNGS: clear to auscultation  CARDIAC: Cor RRR  EXTREMITIES: Trace edema SKIN: no visible open areas of skin breakdown.  Surgical wound from hip fracture has healed well.  NEURO: positive for gait problems, memory problems and weakness   , RN 

## 2020-01-09 DIAGNOSIS — M1990 Unspecified osteoarthritis, unspecified site: Secondary | ICD-10-CM | POA: Diagnosis not present

## 2020-01-09 DIAGNOSIS — J309 Allergic rhinitis, unspecified: Secondary | ICD-10-CM | POA: Diagnosis not present

## 2020-01-09 DIAGNOSIS — Z9181 History of falling: Secondary | ICD-10-CM | POA: Diagnosis not present

## 2020-01-09 DIAGNOSIS — F0390 Unspecified dementia without behavioral disturbance: Secondary | ICD-10-CM | POA: Diagnosis not present

## 2020-01-09 DIAGNOSIS — Z8616 Personal history of COVID-19: Secondary | ICD-10-CM | POA: Diagnosis not present

## 2020-01-09 DIAGNOSIS — K5730 Diverticulosis of large intestine without perforation or abscess without bleeding: Secondary | ICD-10-CM | POA: Diagnosis not present

## 2020-01-09 DIAGNOSIS — I959 Hypotension, unspecified: Secondary | ICD-10-CM | POA: Diagnosis not present

## 2020-01-09 DIAGNOSIS — I447 Left bundle-branch block, unspecified: Secondary | ICD-10-CM | POA: Diagnosis not present

## 2020-01-09 DIAGNOSIS — I700 Atherosclerosis of aorta: Secondary | ICD-10-CM | POA: Diagnosis not present

## 2020-01-09 DIAGNOSIS — F419 Anxiety disorder, unspecified: Secondary | ICD-10-CM | POA: Diagnosis not present

## 2020-01-09 DIAGNOSIS — I1310 Hypertensive heart and chronic kidney disease without heart failure, with stage 1 through stage 4 chronic kidney disease, or unspecified chronic kidney disease: Secondary | ICD-10-CM | POA: Diagnosis not present

## 2020-01-09 DIAGNOSIS — D509 Iron deficiency anemia, unspecified: Secondary | ICD-10-CM | POA: Diagnosis not present

## 2020-01-09 DIAGNOSIS — K649 Unspecified hemorrhoids: Secondary | ICD-10-CM | POA: Diagnosis not present

## 2020-01-09 DIAGNOSIS — E7800 Pure hypercholesterolemia, unspecified: Secondary | ICD-10-CM | POA: Diagnosis not present

## 2020-01-09 DIAGNOSIS — K8020 Calculus of gallbladder without cholecystitis without obstruction: Secondary | ICD-10-CM | POA: Diagnosis not present

## 2020-01-09 DIAGNOSIS — N1832 Chronic kidney disease, stage 3b: Secondary | ICD-10-CM | POA: Diagnosis not present

## 2020-01-09 DIAGNOSIS — I82403 Acute embolism and thrombosis of unspecified deep veins of lower extremity, bilateral: Secondary | ICD-10-CM | POA: Diagnosis not present

## 2020-01-09 DIAGNOSIS — Z96641 Presence of right artificial hip joint: Secondary | ICD-10-CM | POA: Diagnosis not present

## 2020-01-09 DIAGNOSIS — Z8781 Personal history of (healed) traumatic fracture: Secondary | ICD-10-CM | POA: Diagnosis not present

## 2020-01-09 DIAGNOSIS — Z8673 Personal history of transient ischemic attack (TIA), and cerebral infarction without residual deficits: Secondary | ICD-10-CM | POA: Diagnosis not present

## 2020-01-09 DIAGNOSIS — K449 Diaphragmatic hernia without obstruction or gangrene: Secondary | ICD-10-CM | POA: Diagnosis not present

## 2020-01-09 DIAGNOSIS — M80051D Age-related osteoporosis with current pathological fracture, right femur, subsequent encounter for fracture with routine healing: Secondary | ICD-10-CM | POA: Diagnosis not present

## 2020-01-09 DIAGNOSIS — Z79891 Long term (current) use of opiate analgesic: Secondary | ICD-10-CM | POA: Diagnosis not present

## 2020-01-10 ENCOUNTER — Telehealth: Payer: Self-pay

## 2020-01-10 NOTE — Telephone Encounter (Signed)
Please verbally ok those orders 

## 2020-01-10 NOTE — Telephone Encounter (Signed)
Verbal order given to Frankie 

## 2020-01-10 NOTE — Telephone Encounter (Signed)
Tharon Aquas PT with [facility] left v/m requesting verbal orders for [facility] PT 2 x a wk for 4 wks  &  1 x a wk for 5 wks.

## 2020-01-11 DIAGNOSIS — M80051D Age-related osteoporosis with current pathological fracture, right femur, subsequent encounter for fracture with routine healing: Secondary | ICD-10-CM | POA: Diagnosis not present

## 2020-01-11 DIAGNOSIS — F0390 Unspecified dementia without behavioral disturbance: Secondary | ICD-10-CM | POA: Diagnosis not present

## 2020-01-11 DIAGNOSIS — N1832 Chronic kidney disease, stage 3b: Secondary | ICD-10-CM | POA: Diagnosis not present

## 2020-01-11 DIAGNOSIS — I82403 Acute embolism and thrombosis of unspecified deep veins of lower extremity, bilateral: Secondary | ICD-10-CM | POA: Diagnosis not present

## 2020-01-11 DIAGNOSIS — D509 Iron deficiency anemia, unspecified: Secondary | ICD-10-CM | POA: Diagnosis not present

## 2020-01-11 DIAGNOSIS — I1310 Hypertensive heart and chronic kidney disease without heart failure, with stage 1 through stage 4 chronic kidney disease, or unspecified chronic kidney disease: Secondary | ICD-10-CM | POA: Diagnosis not present

## 2020-01-12 DIAGNOSIS — D509 Iron deficiency anemia, unspecified: Secondary | ICD-10-CM | POA: Diagnosis not present

## 2020-01-12 DIAGNOSIS — I82403 Acute embolism and thrombosis of unspecified deep veins of lower extremity, bilateral: Secondary | ICD-10-CM | POA: Diagnosis not present

## 2020-01-12 DIAGNOSIS — M80051D Age-related osteoporosis with current pathological fracture, right femur, subsequent encounter for fracture with routine healing: Secondary | ICD-10-CM | POA: Diagnosis not present

## 2020-01-12 DIAGNOSIS — N1832 Chronic kidney disease, stage 3b: Secondary | ICD-10-CM | POA: Diagnosis not present

## 2020-01-12 DIAGNOSIS — F0390 Unspecified dementia without behavioral disturbance: Secondary | ICD-10-CM | POA: Diagnosis not present

## 2020-01-12 DIAGNOSIS — I1310 Hypertensive heart and chronic kidney disease without heart failure, with stage 1 through stage 4 chronic kidney disease, or unspecified chronic kidney disease: Secondary | ICD-10-CM | POA: Diagnosis not present

## 2020-01-14 DIAGNOSIS — I1310 Hypertensive heart and chronic kidney disease without heart failure, with stage 1 through stage 4 chronic kidney disease, or unspecified chronic kidney disease: Secondary | ICD-10-CM | POA: Diagnosis not present

## 2020-01-14 DIAGNOSIS — D509 Iron deficiency anemia, unspecified: Secondary | ICD-10-CM | POA: Diagnosis not present

## 2020-01-14 DIAGNOSIS — M80051D Age-related osteoporosis with current pathological fracture, right femur, subsequent encounter for fracture with routine healing: Secondary | ICD-10-CM | POA: Diagnosis not present

## 2020-01-14 DIAGNOSIS — F0390 Unspecified dementia without behavioral disturbance: Secondary | ICD-10-CM | POA: Diagnosis not present

## 2020-01-14 DIAGNOSIS — N1832 Chronic kidney disease, stage 3b: Secondary | ICD-10-CM | POA: Diagnosis not present

## 2020-01-14 DIAGNOSIS — I82403 Acute embolism and thrombosis of unspecified deep veins of lower extremity, bilateral: Secondary | ICD-10-CM | POA: Diagnosis not present

## 2020-01-14 NOTE — Progress Notes (Deleted)
  Subjective:     Patient ID: Wendy Krueger, female   DOB: 1930/01/21, 86 y.o.   MRN: [id number]  HPI   Review of Systems     Objective:   Physical Exam     Assessment:     ***    Plan:     ***

## 2020-01-18 DIAGNOSIS — N1832 Chronic kidney disease, stage 3b: Secondary | ICD-10-CM | POA: Diagnosis not present

## 2020-01-18 DIAGNOSIS — D509 Iron deficiency anemia, unspecified: Secondary | ICD-10-CM | POA: Diagnosis not present

## 2020-01-18 DIAGNOSIS — F0390 Unspecified dementia without behavioral disturbance: Secondary | ICD-10-CM | POA: Diagnosis not present

## 2020-01-18 DIAGNOSIS — I1310 Hypertensive heart and chronic kidney disease without heart failure, with stage 1 through stage 4 chronic kidney disease, or unspecified chronic kidney disease: Secondary | ICD-10-CM | POA: Diagnosis not present

## 2020-01-18 DIAGNOSIS — I82403 Acute embolism and thrombosis of unspecified deep veins of lower extremity, bilateral: Secondary | ICD-10-CM | POA: Diagnosis not present

## 2020-01-18 DIAGNOSIS — M80051D Age-related osteoporosis with current pathological fracture, right femur, subsequent encounter for fracture with routine healing: Secondary | ICD-10-CM | POA: Diagnosis not present

## 2020-01-19 DIAGNOSIS — I1310 Hypertensive heart and chronic kidney disease without heart failure, with stage 1 through stage 4 chronic kidney disease, or unspecified chronic kidney disease: Secondary | ICD-10-CM | POA: Diagnosis not present

## 2020-01-19 DIAGNOSIS — M80051D Age-related osteoporosis with current pathological fracture, right femur, subsequent encounter for fracture with routine healing: Secondary | ICD-10-CM | POA: Diagnosis not present

## 2020-01-19 DIAGNOSIS — F0390 Unspecified dementia without behavioral disturbance: Secondary | ICD-10-CM | POA: Diagnosis not present

## 2020-01-19 DIAGNOSIS — N1832 Chronic kidney disease, stage 3b: Secondary | ICD-10-CM | POA: Diagnosis not present

## 2020-01-19 DIAGNOSIS — I82403 Acute embolism and thrombosis of unspecified deep veins of lower extremity, bilateral: Secondary | ICD-10-CM | POA: Diagnosis not present

## 2020-01-19 DIAGNOSIS — D509 Iron deficiency anemia, unspecified: Secondary | ICD-10-CM | POA: Diagnosis not present

## 2020-01-20 DIAGNOSIS — N1832 Chronic kidney disease, stage 3b: Secondary | ICD-10-CM | POA: Diagnosis not present

## 2020-01-20 DIAGNOSIS — M80051D Age-related osteoporosis with current pathological fracture, right femur, subsequent encounter for fracture with routine healing: Secondary | ICD-10-CM | POA: Diagnosis not present

## 2020-01-20 DIAGNOSIS — D509 Iron deficiency anemia, unspecified: Secondary | ICD-10-CM | POA: Diagnosis not present

## 2020-01-20 DIAGNOSIS — F0390 Unspecified dementia without behavioral disturbance: Secondary | ICD-10-CM | POA: Diagnosis not present

## 2020-01-20 DIAGNOSIS — I82403 Acute embolism and thrombosis of unspecified deep veins of lower extremity, bilateral: Secondary | ICD-10-CM | POA: Diagnosis not present

## 2020-01-20 DIAGNOSIS — I1310 Hypertensive heart and chronic kidney disease without heart failure, with stage 1 through stage 4 chronic kidney disease, or unspecified chronic kidney disease: Secondary | ICD-10-CM | POA: Diagnosis not present

## 2020-01-21 NOTE — Progress Notes (Signed)
NA

## 2020-01-24 ENCOUNTER — Telehealth: Payer: Self-pay | Admitting: *Deleted

## 2020-01-24 DIAGNOSIS — S72031D Displaced midcervical fracture of right femur, subsequent encounter for closed fracture with routine healing: Secondary | ICD-10-CM | POA: Diagnosis not present

## 2020-01-24 DIAGNOSIS — D509 Iron deficiency anemia, unspecified: Secondary | ICD-10-CM | POA: Diagnosis not present

## 2020-01-24 DIAGNOSIS — N1832 Chronic kidney disease, stage 3b: Secondary | ICD-10-CM | POA: Diagnosis not present

## 2020-01-24 DIAGNOSIS — F0390 Unspecified dementia without behavioral disturbance: Secondary | ICD-10-CM | POA: Diagnosis not present

## 2020-01-24 DIAGNOSIS — I1310 Hypertensive heart and chronic kidney disease without heart failure, with stage 1 through stage 4 chronic kidney disease, or unspecified chronic kidney disease: Secondary | ICD-10-CM | POA: Diagnosis not present

## 2020-01-24 DIAGNOSIS — I82403 Acute embolism and thrombosis of unspecified deep veins of lower extremity, bilateral: Secondary | ICD-10-CM | POA: Diagnosis not present

## 2020-01-24 DIAGNOSIS — M80051D Age-related osteoporosis with current pathological fracture, right femur, subsequent encounter for fracture with routine healing: Secondary | ICD-10-CM | POA: Diagnosis not present

## 2020-01-24 DIAGNOSIS — S8264XD Nondisplaced fracture of lateral malleolus of right fibula, subsequent encounter for closed fracture with routine healing: Secondary | ICD-10-CM | POA: Diagnosis not present

## 2020-01-24 MED ORDER — LOSARTAN POTASSIUM 100 MG PO TABS
100.0000 mg | ORAL_TABLET | Freq: Every day | ORAL | 3 refills | Status: DC
Start: 2020-01-24 — End: 2020-12-19

## 2020-01-24 NOTE — Telephone Encounter (Signed)
Amy notified of Dr. Marliss Coots comments. Rx sent to pharmacy and Amy will keep [place] posted. She will also let daughter [name] know Rx was sent to pharmacy

## 2020-01-24 NOTE — Telephone Encounter (Signed)
Amy nurse with [facility] left a voicemail stating that she got a blood pressure reading on the patient today of 180/100. Amy stated that the family has reported other elevated readings. Amy stated that her diastolic readings have been in the 80/90's. Amy stated that she does not have a headache. Amy stated that patient is taking Metoprolol 25 mg bid. Amy stated that she thinks that patient use to be on Lisinopril or something else and may need to start another low dose medication for her blood pressure.

## 2020-01-24 NOTE — Telephone Encounter (Signed)
She took losartan 100 mg  Let's re start  I pended to send to pharmacy of choice   Let me know if bp does not improve or if it gets too low

## 2020-01-25 DIAGNOSIS — F0390 Unspecified dementia without behavioral disturbance: Secondary | ICD-10-CM | POA: Diagnosis not present

## 2020-01-25 DIAGNOSIS — D509 Iron deficiency anemia, unspecified: Secondary | ICD-10-CM | POA: Diagnosis not present

## 2020-01-25 DIAGNOSIS — I82403 Acute embolism and thrombosis of unspecified deep veins of lower extremity, bilateral: Secondary | ICD-10-CM | POA: Diagnosis not present

## 2020-01-25 DIAGNOSIS — N1832 Chronic kidney disease, stage 3b: Secondary | ICD-10-CM | POA: Diagnosis not present

## 2020-01-25 DIAGNOSIS — M80051D Age-related osteoporosis with current pathological fracture, right femur, subsequent encounter for fracture with routine healing: Secondary | ICD-10-CM | POA: Diagnosis not present

## 2020-01-25 DIAGNOSIS — I1310 Hypertensive heart and chronic kidney disease without heart failure, with stage 1 through stage 4 chronic kidney disease, or unspecified chronic kidney disease: Secondary | ICD-10-CM | POA: Diagnosis not present

## 2020-01-27 DIAGNOSIS — I1310 Hypertensive heart and chronic kidney disease without heart failure, with stage 1 through stage 4 chronic kidney disease, or unspecified chronic kidney disease: Secondary | ICD-10-CM | POA: Diagnosis not present

## 2020-01-27 DIAGNOSIS — M80051D Age-related osteoporosis with current pathological fracture, right femur, subsequent encounter for fracture with routine healing: Secondary | ICD-10-CM | POA: Diagnosis not present

## 2020-01-27 DIAGNOSIS — N1832 Chronic kidney disease, stage 3b: Secondary | ICD-10-CM | POA: Diagnosis not present

## 2020-01-27 DIAGNOSIS — I82403 Acute embolism and thrombosis of unspecified deep veins of lower extremity, bilateral: Secondary | ICD-10-CM | POA: Diagnosis not present

## 2020-01-27 DIAGNOSIS — D509 Iron deficiency anemia, unspecified: Secondary | ICD-10-CM | POA: Diagnosis not present

## 2020-01-27 DIAGNOSIS — F0390 Unspecified dementia without behavioral disturbance: Secondary | ICD-10-CM | POA: Diagnosis not present

## 2020-02-01 DIAGNOSIS — I1310 Hypertensive heart and chronic kidney disease without heart failure, with stage 1 through stage 4 chronic kidney disease, or unspecified chronic kidney disease: Secondary | ICD-10-CM | POA: Diagnosis not present

## 2020-02-01 DIAGNOSIS — M80051D Age-related osteoporosis with current pathological fracture, right femur, subsequent encounter for fracture with routine healing: Secondary | ICD-10-CM | POA: Diagnosis not present

## 2020-02-01 DIAGNOSIS — I82403 Acute embolism and thrombosis of unspecified deep veins of lower extremity, bilateral: Secondary | ICD-10-CM | POA: Diagnosis not present

## 2020-02-01 DIAGNOSIS — D509 Iron deficiency anemia, unspecified: Secondary | ICD-10-CM | POA: Diagnosis not present

## 2020-02-01 DIAGNOSIS — F0390 Unspecified dementia without behavioral disturbance: Secondary | ICD-10-CM | POA: Diagnosis not present

## 2020-02-01 DIAGNOSIS — N1832 Chronic kidney disease, stage 3b: Secondary | ICD-10-CM | POA: Diagnosis not present

## 2020-02-02 DIAGNOSIS — I1310 Hypertensive heart and chronic kidney disease without heart failure, with stage 1 through stage 4 chronic kidney disease, or unspecified chronic kidney disease: Secondary | ICD-10-CM | POA: Diagnosis not present

## 2020-02-02 DIAGNOSIS — N1832 Chronic kidney disease, stage 3b: Secondary | ICD-10-CM | POA: Diagnosis not present

## 2020-02-02 DIAGNOSIS — D509 Iron deficiency anemia, unspecified: Secondary | ICD-10-CM | POA: Diagnosis not present

## 2020-02-02 DIAGNOSIS — I82403 Acute embolism and thrombosis of unspecified deep veins of lower extremity, bilateral: Secondary | ICD-10-CM | POA: Diagnosis not present

## 2020-02-02 DIAGNOSIS — M80051D Age-related osteoporosis with current pathological fracture, right femur, subsequent encounter for fracture with routine healing: Secondary | ICD-10-CM | POA: Diagnosis not present

## 2020-02-02 DIAGNOSIS — F0390 Unspecified dementia without behavioral disturbance: Secondary | ICD-10-CM | POA: Diagnosis not present

## 2020-02-07 ENCOUNTER — Telehealth: Payer: Self-pay

## 2020-02-07 ENCOUNTER — Telehealth: Payer: Self-pay | Admitting: *Deleted

## 2020-02-07 NOTE — Telephone Encounter (Signed)
Thanks for letting me know -if possible please encourage to get an arm cuff (not a wrist cuff)

## 2020-02-07 NOTE — Telephone Encounter (Signed)
Amy with [facility] left a voicemail stating that patent was started on Losartan because her blood pressure was elevated. Amy stated that she was going to discharge patient from her care but has decided to continue to see her for a couple of weeks if she can get a verbal order from Dr. Glori Bickers. Amy stated that she does not feel that patient's blood pressure cuff is working so her son was going to buy her a new one. Amy stated that she usually sees patient on Wednesday and will call with blood pressure readings when she sees her.

## 2020-02-07 NOTE — Telephone Encounter (Signed)
Telephone call to schedule palliative care visit.  Daughter [name] in agreement with RN making telephonic visit 02/11/20 at 1:00 PM.

## 2020-02-07 NOTE — Telephone Encounter (Signed)
Amy notified of Dr. Marliss Coots comments. She said she also has been checking pt's BP and it is still a little hight but not as high has the BP cuff has been reading. She will go back out Wednesday and recheck it and have some BP reading for [place] so Dr. Glori Bickers can decide what to do regarding BP but she did want me to let Dr. Glori Bickers know even though BP is slightly elevated she has no sxs and Amy will CB on Wednesday

## 2020-02-07 NOTE — Telephone Encounter (Signed)
Aware, thanks  That sounds fine

## 2020-02-08 DIAGNOSIS — E7800 Pure hypercholesterolemia, unspecified: Secondary | ICD-10-CM | POA: Diagnosis not present

## 2020-02-08 DIAGNOSIS — I82403 Acute embolism and thrombosis of unspecified deep veins of lower extremity, bilateral: Secondary | ICD-10-CM | POA: Diagnosis not present

## 2020-02-08 DIAGNOSIS — M80051D Age-related osteoporosis with current pathological fracture, right femur, subsequent encounter for fracture with routine healing: Secondary | ICD-10-CM | POA: Diagnosis not present

## 2020-02-08 DIAGNOSIS — Z9181 History of falling: Secondary | ICD-10-CM | POA: Diagnosis not present

## 2020-02-08 DIAGNOSIS — I1310 Hypertensive heart and chronic kidney disease without heart failure, with stage 1 through stage 4 chronic kidney disease, or unspecified chronic kidney disease: Secondary | ICD-10-CM | POA: Diagnosis not present

## 2020-02-08 DIAGNOSIS — K8020 Calculus of gallbladder without cholecystitis without obstruction: Secondary | ICD-10-CM | POA: Diagnosis not present

## 2020-02-08 DIAGNOSIS — F0390 Unspecified dementia without behavioral disturbance: Secondary | ICD-10-CM | POA: Diagnosis not present

## 2020-02-08 DIAGNOSIS — K5730 Diverticulosis of large intestine without perforation or abscess without bleeding: Secondary | ICD-10-CM | POA: Diagnosis not present

## 2020-02-08 DIAGNOSIS — K649 Unspecified hemorrhoids: Secondary | ICD-10-CM | POA: Diagnosis not present

## 2020-02-08 DIAGNOSIS — F419 Anxiety disorder, unspecified: Secondary | ICD-10-CM | POA: Diagnosis not present

## 2020-02-08 DIAGNOSIS — Z8781 Personal history of (healed) traumatic fracture: Secondary | ICD-10-CM | POA: Diagnosis not present

## 2020-02-08 DIAGNOSIS — I959 Hypotension, unspecified: Secondary | ICD-10-CM | POA: Diagnosis not present

## 2020-02-08 DIAGNOSIS — I447 Left bundle-branch block, unspecified: Secondary | ICD-10-CM | POA: Diagnosis not present

## 2020-02-08 DIAGNOSIS — I700 Atherosclerosis of aorta: Secondary | ICD-10-CM | POA: Diagnosis not present

## 2020-02-08 DIAGNOSIS — N1832 Chronic kidney disease, stage 3b: Secondary | ICD-10-CM | POA: Diagnosis not present

## 2020-02-08 DIAGNOSIS — K449 Diaphragmatic hernia without obstruction or gangrene: Secondary | ICD-10-CM | POA: Diagnosis not present

## 2020-02-08 DIAGNOSIS — J309 Allergic rhinitis, unspecified: Secondary | ICD-10-CM | POA: Diagnosis not present

## 2020-02-08 DIAGNOSIS — M1990 Unspecified osteoarthritis, unspecified site: Secondary | ICD-10-CM | POA: Diagnosis not present

## 2020-02-08 DIAGNOSIS — Z8673 Personal history of transient ischemic attack (TIA), and cerebral infarction without residual deficits: Secondary | ICD-10-CM | POA: Diagnosis not present

## 2020-02-08 DIAGNOSIS — Z79891 Long term (current) use of opiate analgesic: Secondary | ICD-10-CM | POA: Diagnosis not present

## 2020-02-08 DIAGNOSIS — Z96641 Presence of right artificial hip joint: Secondary | ICD-10-CM | POA: Diagnosis not present

## 2020-02-08 DIAGNOSIS — Z8616 Personal history of COVID-19: Secondary | ICD-10-CM | POA: Diagnosis not present

## 2020-02-08 DIAGNOSIS — D509 Iron deficiency anemia, unspecified: Secondary | ICD-10-CM | POA: Diagnosis not present

## 2020-02-09 DIAGNOSIS — N1832 Chronic kidney disease, stage 3b: Secondary | ICD-10-CM | POA: Diagnosis not present

## 2020-02-09 DIAGNOSIS — M80051D Age-related osteoporosis with current pathological fracture, right femur, subsequent encounter for fracture with routine healing: Secondary | ICD-10-CM | POA: Diagnosis not present

## 2020-02-09 DIAGNOSIS — I1310 Hypertensive heart and chronic kidney disease without heart failure, with stage 1 through stage 4 chronic kidney disease, or unspecified chronic kidney disease: Secondary | ICD-10-CM | POA: Diagnosis not present

## 2020-02-09 DIAGNOSIS — D509 Iron deficiency anemia, unspecified: Secondary | ICD-10-CM | POA: Diagnosis not present

## 2020-02-09 DIAGNOSIS — I82403 Acute embolism and thrombosis of unspecified deep veins of lower extremity, bilateral: Secondary | ICD-10-CM | POA: Diagnosis not present

## 2020-02-09 DIAGNOSIS — F0390 Unspecified dementia without behavioral disturbance: Secondary | ICD-10-CM | POA: Diagnosis not present

## 2020-02-10 ENCOUNTER — Telehealth: Payer: Self-pay | Admitting: Family Medicine

## 2020-02-10 NOTE — Telephone Encounter (Signed)
Amy with [facility] left a voicemail that patient just received a new blood pressure cuff. She wanted to inform some BP readings, this afternoon around 2:30 pm after taking losartan and metoprolol, patient BP was 196/95 (LA) and 170/95 (RA). Before meds, this morning was 184/94 (LA) and 182/96 (RA). Yesterday BP was 150/90 with P 68.  Amy suggested maybe Dr Glori Bickers would like add another BP medication. Please advise

## 2020-02-10 NOTE — Telephone Encounter (Signed)
I want to add hctz 25 mg 1 po qd #30 5 ref  We will need f/u and labs in about 2 weeks if possible  Let me know how bp is after starting it  I think she was on it in the past

## 2020-02-11 ENCOUNTER — Other Ambulatory Visit: Payer: Self-pay

## 2020-02-11 ENCOUNTER — Other Ambulatory Visit: Payer: Medicare Other

## 2020-02-11 VITALS — BP 179/93

## 2020-02-11 DIAGNOSIS — Z515 Encounter for palliative care: Secondary | ICD-10-CM

## 2020-02-11 MED ORDER — HYDROCHLOROTHIAZIDE 25 MG PO TABS
25.0000 mg | ORAL_TABLET | Freq: Every day | ORAL | 5 refills | Status: DC
Start: 2020-02-11 — End: 2020-10-25

## 2020-02-11 NOTE — Progress Notes (Signed)
PATIENT NAME: Wendy Krueger DOB: 1929-11-18 MRN: [id number]  PRIMARY CARE PROVIDER: Abner Greenspan, MD  RESPONSIBLE PARTY:  Acct ID - Guarantor Home Phone Work Phone Relationship Acct Type  [id number] Wendy Krueger, Wendy [phone]  Self P/F     [address], [place], [place] [address]    PLAN OF CARE and INTERVENTIONS:               1.  GOALS OF CARE/ ADVANCE CARE PLANNING:  Family wants patient to remain at home and be comfortable.               2.  PATIENT/CAREGIVER EDUCATION:  Education on fall precautions, education on need to take stool softners to prevent constipation, reviewed meds, support               3  DISEASE STATUS: RN completed telephonic assessment. Nurse talk with patients [name] and hired caregiver Wendy Krueger, both daughter and caregiver in home with patient. Nurse speaks to patient.  Patient pleasant and and cooperative during telephone call.   Patient denies pain at the present time.  Wendy Krueger reports patient will sometimes complain of pain in right leg which was fractured a few months ago.  Patient had hip surgery and is walking much better with assistance per Wendy Krueger. Wendy Krueger states caregiver and family make sure patient has gait belt on when she ambulates uses her walker and they are hold on to patient. Patient has not suffered any recent falls. Wendy Krueger states patients blood pressure has been elevated. Wendy Krueger reports MD added Losartan in additional to Metoprolol and HCTZ 25 mg daily was added today.  Wendy Krueger reports they have not weighed patient recently. Wendy Krueger reports patients blood pressure this AM was 179 / 93 in  left-arm 176 / 101 in right arm. Education to [place] and caregiver that Hydrochlorothiazide would cause increased urination in patient. Daughter and caregiver report patient has been eating well. Wendy Krueger reports patient will forget that she has eaten and they will give patient a snack. Patient has had no shortness of breath but did have a cough that subsided this  AM after patient was up for a while. Wendy Krueger reports patient has not had any anxiety. Wendy Krueger reports patient's hemorrhoids have not been bleeding. Wendy Krueger feels like medications have caused patient to have some constipation so stool softeners have been added back to patient's medications. Patient has had no bleeding other than smears from hemorrhoids. Patient has had no other changes in medications other than Losartan and Hydrochlorothiazide being added back to medications. Patient has had no MD appointments. Family remains in agreement with palliative care services. Family and caregiver encouraged to contact palliative care with questions or concerns.     HISTORY OF PRESENT ILLNESS:  Patient is a 86 year old female who resides in her home.  Patient has hired caregiver through [place] with her during the day.  Patients family very involved in patients care.  Patient is followed by palliative care and is seen monthly and PRN by palliative care.   CODE STATUS: DNR  ADVANCED DIRECTIVES: Y MOST FORM: Yes PPS: 50%   PHYSICAL EXAM:   VITALS: Today's Vitals   02/11/20 1311  BP: (!) 179/93  PainSc: 0-No pain    LUNGS: No shortness of breath per [name].  Patient ha a slight cough upon waking but subsided after patient was up. CARDIAC: no complaints of chest pain EXTREMITIES: no swelling in feet or legs per [name] SKIN: no open areas  of skin breakdown  NEURO: alert to self, forgetful, gait problems, fall risk        Nilda Simmer, RN

## 2020-02-11 NOTE — Telephone Encounter (Signed)
Spoken and notified Amy of Dr Marliss Coots comments. Sent hctz to [facility].  Amy will let patient's daughter know of this infomation

## 2020-02-16 DIAGNOSIS — D509 Iron deficiency anemia, unspecified: Secondary | ICD-10-CM | POA: Diagnosis not present

## 2020-02-16 DIAGNOSIS — N1832 Chronic kidney disease, stage 3b: Secondary | ICD-10-CM | POA: Diagnosis not present

## 2020-02-16 DIAGNOSIS — M80051D Age-related osteoporosis with current pathological fracture, right femur, subsequent encounter for fracture with routine healing: Secondary | ICD-10-CM | POA: Diagnosis not present

## 2020-02-16 DIAGNOSIS — I82403 Acute embolism and thrombosis of unspecified deep veins of lower extremity, bilateral: Secondary | ICD-10-CM | POA: Diagnosis not present

## 2020-02-16 DIAGNOSIS — I1310 Hypertensive heart and chronic kidney disease without heart failure, with stage 1 through stage 4 chronic kidney disease, or unspecified chronic kidney disease: Secondary | ICD-10-CM | POA: Diagnosis not present

## 2020-02-16 DIAGNOSIS — F0390 Unspecified dementia without behavioral disturbance: Secondary | ICD-10-CM | POA: Diagnosis not present

## 2020-02-18 DIAGNOSIS — D509 Iron deficiency anemia, unspecified: Secondary | ICD-10-CM | POA: Diagnosis not present

## 2020-02-18 DIAGNOSIS — I82403 Acute embolism and thrombosis of unspecified deep veins of lower extremity, bilateral: Secondary | ICD-10-CM | POA: Diagnosis not present

## 2020-02-18 DIAGNOSIS — N1832 Chronic kidney disease, stage 3b: Secondary | ICD-10-CM | POA: Diagnosis not present

## 2020-02-18 DIAGNOSIS — M80051D Age-related osteoporosis with current pathological fracture, right femur, subsequent encounter for fracture with routine healing: Secondary | ICD-10-CM | POA: Diagnosis not present

## 2020-02-18 DIAGNOSIS — I1310 Hypertensive heart and chronic kidney disease without heart failure, with stage 1 through stage 4 chronic kidney disease, or unspecified chronic kidney disease: Secondary | ICD-10-CM | POA: Diagnosis not present

## 2020-02-18 DIAGNOSIS — F0390 Unspecified dementia without behavioral disturbance: Secondary | ICD-10-CM | POA: Diagnosis not present

## 2020-02-22 DIAGNOSIS — N1832 Chronic kidney disease, stage 3b: Secondary | ICD-10-CM | POA: Diagnosis not present

## 2020-02-22 DIAGNOSIS — I1310 Hypertensive heart and chronic kidney disease without heart failure, with stage 1 through stage 4 chronic kidney disease, or unspecified chronic kidney disease: Secondary | ICD-10-CM | POA: Diagnosis not present

## 2020-02-22 DIAGNOSIS — D509 Iron deficiency anemia, unspecified: Secondary | ICD-10-CM | POA: Diagnosis not present

## 2020-02-22 DIAGNOSIS — F0390 Unspecified dementia without behavioral disturbance: Secondary | ICD-10-CM | POA: Diagnosis not present

## 2020-02-22 DIAGNOSIS — M80051D Age-related osteoporosis with current pathological fracture, right femur, subsequent encounter for fracture with routine healing: Secondary | ICD-10-CM | POA: Diagnosis not present

## 2020-02-22 DIAGNOSIS — I82403 Acute embolism and thrombosis of unspecified deep veins of lower extremity, bilateral: Secondary | ICD-10-CM | POA: Diagnosis not present

## 2020-02-23 DIAGNOSIS — I82403 Acute embolism and thrombosis of unspecified deep veins of lower extremity, bilateral: Secondary | ICD-10-CM | POA: Diagnosis not present

## 2020-02-23 DIAGNOSIS — M80051D Age-related osteoporosis with current pathological fracture, right femur, subsequent encounter for fracture with routine healing: Secondary | ICD-10-CM | POA: Diagnosis not present

## 2020-02-23 DIAGNOSIS — F0390 Unspecified dementia without behavioral disturbance: Secondary | ICD-10-CM | POA: Diagnosis not present

## 2020-02-23 DIAGNOSIS — D509 Iron deficiency anemia, unspecified: Secondary | ICD-10-CM | POA: Diagnosis not present

## 2020-02-23 DIAGNOSIS — N1832 Chronic kidney disease, stage 3b: Secondary | ICD-10-CM | POA: Diagnosis not present

## 2020-02-23 DIAGNOSIS — I1310 Hypertensive heart and chronic kidney disease without heart failure, with stage 1 through stage 4 chronic kidney disease, or unspecified chronic kidney disease: Secondary | ICD-10-CM | POA: Diagnosis not present

## 2020-02-29 DIAGNOSIS — I1310 Hypertensive heart and chronic kidney disease without heart failure, with stage 1 through stage 4 chronic kidney disease, or unspecified chronic kidney disease: Secondary | ICD-10-CM | POA: Diagnosis not present

## 2020-02-29 DIAGNOSIS — M80051D Age-related osteoporosis with current pathological fracture, right femur, subsequent encounter for fracture with routine healing: Secondary | ICD-10-CM | POA: Diagnosis not present

## 2020-02-29 DIAGNOSIS — N1832 Chronic kidney disease, stage 3b: Secondary | ICD-10-CM | POA: Diagnosis not present

## 2020-02-29 DIAGNOSIS — D509 Iron deficiency anemia, unspecified: Secondary | ICD-10-CM | POA: Diagnosis not present

## 2020-02-29 DIAGNOSIS — I82403 Acute embolism and thrombosis of unspecified deep veins of lower extremity, bilateral: Secondary | ICD-10-CM | POA: Diagnosis not present

## 2020-02-29 DIAGNOSIS — F0390 Unspecified dementia without behavioral disturbance: Secondary | ICD-10-CM | POA: Diagnosis not present

## 2020-03-01 DIAGNOSIS — I1310 Hypertensive heart and chronic kidney disease without heart failure, with stage 1 through stage 4 chronic kidney disease, or unspecified chronic kidney disease: Secondary | ICD-10-CM | POA: Diagnosis not present

## 2020-03-01 DIAGNOSIS — F0390 Unspecified dementia without behavioral disturbance: Secondary | ICD-10-CM | POA: Diagnosis not present

## 2020-03-01 DIAGNOSIS — M80051D Age-related osteoporosis with current pathological fracture, right femur, subsequent encounter for fracture with routine healing: Secondary | ICD-10-CM | POA: Diagnosis not present

## 2020-03-01 DIAGNOSIS — N1832 Chronic kidney disease, stage 3b: Secondary | ICD-10-CM | POA: Diagnosis not present

## 2020-03-01 DIAGNOSIS — D509 Iron deficiency anemia, unspecified: Secondary | ICD-10-CM | POA: Diagnosis not present

## 2020-03-01 DIAGNOSIS — I82403 Acute embolism and thrombosis of unspecified deep veins of lower extremity, bilateral: Secondary | ICD-10-CM | POA: Diagnosis not present

## 2020-03-07 DIAGNOSIS — M80051D Age-related osteoporosis with current pathological fracture, right femur, subsequent encounter for fracture with routine healing: Secondary | ICD-10-CM | POA: Diagnosis not present

## 2020-03-07 DIAGNOSIS — S72031D Displaced midcervical fracture of right femur, subsequent encounter for closed fracture with routine healing: Secondary | ICD-10-CM | POA: Diagnosis not present

## 2020-03-07 DIAGNOSIS — D509 Iron deficiency anemia, unspecified: Secondary | ICD-10-CM | POA: Diagnosis not present

## 2020-03-07 DIAGNOSIS — F0390 Unspecified dementia without behavioral disturbance: Secondary | ICD-10-CM | POA: Diagnosis not present

## 2020-03-07 DIAGNOSIS — N1832 Chronic kidney disease, stage 3b: Secondary | ICD-10-CM | POA: Diagnosis not present

## 2020-03-07 DIAGNOSIS — I82403 Acute embolism and thrombosis of unspecified deep veins of lower extremity, bilateral: Secondary | ICD-10-CM | POA: Diagnosis not present

## 2020-03-07 DIAGNOSIS — S8264XD Nondisplaced fracture of lateral malleolus of right fibula, subsequent encounter for closed fracture with routine healing: Secondary | ICD-10-CM | POA: Diagnosis not present

## 2020-03-07 DIAGNOSIS — I1310 Hypertensive heart and chronic kidney disease without heart failure, with stage 1 through stage 4 chronic kidney disease, or unspecified chronic kidney disease: Secondary | ICD-10-CM | POA: Diagnosis not present

## 2020-03-08 ENCOUNTER — Encounter: Payer: Self-pay | Admitting: Family Medicine

## 2020-03-08 DIAGNOSIS — N1832 Chronic kidney disease, stage 3b: Secondary | ICD-10-CM | POA: Diagnosis not present

## 2020-03-08 DIAGNOSIS — D649 Anemia, unspecified: Secondary | ICD-10-CM | POA: Diagnosis not present

## 2020-03-08 DIAGNOSIS — M80051D Age-related osteoporosis with current pathological fracture, right femur, subsequent encounter for fracture with routine healing: Secondary | ICD-10-CM | POA: Diagnosis not present

## 2020-03-08 DIAGNOSIS — I1310 Hypertensive heart and chronic kidney disease without heart failure, with stage 1 through stage 4 chronic kidney disease, or unspecified chronic kidney disease: Secondary | ICD-10-CM | POA: Diagnosis not present

## 2020-03-08 DIAGNOSIS — D509 Iron deficiency anemia, unspecified: Secondary | ICD-10-CM | POA: Diagnosis not present

## 2020-03-08 DIAGNOSIS — I82403 Acute embolism and thrombosis of unspecified deep veins of lower extremity, bilateral: Secondary | ICD-10-CM | POA: Diagnosis not present

## 2020-03-08 DIAGNOSIS — F0390 Unspecified dementia without behavioral disturbance: Secondary | ICD-10-CM | POA: Diagnosis not present

## 2020-03-13 ENCOUNTER — Telehealth: Payer: Self-pay

## 2020-03-13 MED ORDER — POLYSACCHARIDE IRON COMPLEX 150 MG PO CAPS: 150.0000 mg | ORAL_CAPSULE | ORAL | 1 refills | Status: DC

## 2020-03-13 NOTE — Addendum Note (Signed)
Addended by: Tammi Sou on: 03/13/2020 01:47 PM   Modules accepted: Orders

## 2020-03-13 NOTE — Telephone Encounter (Signed)
Patient's daughter contacted the office and states the [facility] nurse came out to check patient's blood work recently, and she would like to know if we received this, and how her iron labs came back? She states she would also like to know if she needs to stay on her iron supplement? If so, she will need a refill sent in for this?

## 2020-03-13 NOTE — Telephone Encounter (Signed)
Dr. Glori Bickers just received labs. She put note on labs:  "Anemia is improved (normal range now)"  I asked Dr. Glori Bickers about pt taking Iron supplement and she said last time we cut Rx down to 1 tab daily so we can cut it down to every other day. New Rx sent in and [place] notified. She was advised if GI/rectal bleeding starts back up to let us know because we may need to repeat labs at that time but for now labs are stable

## 2020-03-17 ENCOUNTER — Telehealth: Payer: Self-pay | Admitting: Family Medicine

## 2020-03-17 NOTE — Telephone Encounter (Signed)
LVM for pt to rtn my call to r/s appt with [facility]. 

## 2020-03-29 ENCOUNTER — Telehealth: Payer: Self-pay

## 2020-03-29 NOTE — Telephone Encounter (Signed)
Palliative care SW outreached patient to complete telephonic visit. Daughter provided update on medical condition and/or changes. Daughter shared that patient has been doing well. No pain reported. No recent falls. Patient is eating well, but often times forgets that she has just ate. Per daughter Iron medication was changed to QOD from daily. Patient sleeping well. Daughter shared that overall patient has been doing well and still has caregiver, [name], in the home with her. They continue to encourage patient to ambulate with walker around the kitchen a few times a day. Daughter appreciative of telephonic check in. Palliative care will continue to monitor and assist with long term care planning as needed.

## 2020-04-17 ENCOUNTER — Ambulatory Visit: Payer: Medicare Other

## 2020-04-21 ENCOUNTER — Ambulatory Visit: Payer: Medicare Other

## 2020-04-21 ENCOUNTER — Encounter: Payer: Medicare Other | Admitting: Family Medicine

## 2020-04-25 ENCOUNTER — Telehealth: Payer: Self-pay

## 2020-04-25 NOTE — Telephone Encounter (Signed)
10:57AM: Palliative care SW outreached patients daughter, [name], to complete telephonic visit.   Left HIPPA compliant VM. Awaiting return call.

## 2020-04-26 ENCOUNTER — Ambulatory Visit (INDEPENDENT_AMBULATORY_CARE_PROVIDER_SITE_OTHER): Payer: Medicare Other | Admitting: Family Medicine

## 2020-04-26 ENCOUNTER — Encounter: Payer: Self-pay | Admitting: Family Medicine

## 2020-04-26 ENCOUNTER — Ambulatory Visit (INDEPENDENT_AMBULATORY_CARE_PROVIDER_SITE_OTHER): Payer: Medicare Other

## 2020-04-26 ENCOUNTER — Other Ambulatory Visit: Payer: Self-pay

## 2020-04-26 VITALS — BP 122/70 | HR 75 | Temp 96.5°F | Ht 60.0 in | Wt 150.6 lb

## 2020-04-26 DIAGNOSIS — D500 Iron deficiency anemia secondary to blood loss (chronic): Secondary | ICD-10-CM | POA: Diagnosis not present

## 2020-04-26 DIAGNOSIS — F0390 Unspecified dementia without behavioral disturbance: Secondary | ICD-10-CM | POA: Diagnosis not present

## 2020-04-26 DIAGNOSIS — I6930 Unspecified sequelae of cerebral infarction: Secondary | ICD-10-CM

## 2020-04-26 DIAGNOSIS — K649 Unspecified hemorrhoids: Secondary | ICD-10-CM | POA: Diagnosis not present

## 2020-04-26 DIAGNOSIS — F03C0 Unspecified dementia, severe, without behavioral disturbance, psychotic disturbance, mood disturbance, and anxiety: Secondary | ICD-10-CM

## 2020-04-26 DIAGNOSIS — N1832 Chronic kidney disease, stage 3b: Secondary | ICD-10-CM | POA: Diagnosis not present

## 2020-04-26 DIAGNOSIS — E559 Vitamin D deficiency, unspecified: Secondary | ICD-10-CM | POA: Diagnosis not present

## 2020-04-26 DIAGNOSIS — M810 Age-related osteoporosis without current pathological fracture: Secondary | ICD-10-CM

## 2020-04-26 DIAGNOSIS — Z0000 Encounter for general adult medical examination without abnormal findings: Secondary | ICD-10-CM

## 2020-04-26 DIAGNOSIS — I720 Aneurysm of carotid artery: Secondary | ICD-10-CM | POA: Diagnosis not present

## 2020-04-26 DIAGNOSIS — I10 Essential (primary) hypertension: Secondary | ICD-10-CM | POA: Diagnosis not present

## 2020-04-26 DIAGNOSIS — Z23 Encounter for immunization: Secondary | ICD-10-CM

## 2020-04-26 DIAGNOSIS — E7800 Pure hypercholesterolemia, unspecified: Secondary | ICD-10-CM | POA: Diagnosis not present

## 2020-04-26 MED ORDER — QUETIAPINE FUMARATE 25 MG PO TABS: ORAL_TABLET | ORAL | 1 refills | Status: DC

## 2020-04-26 NOTE — Assessment & Plan Note (Signed)
With OP  Level today

## 2020-04-26 NOTE — Assessment & Plan Note (Signed)
No longer on any anticoagulation due to GI bleed  No further acute incidents and bp is normalizing

## 2020-04-26 NOTE — Progress Notes (Signed)
Subjective:   Wendy Krueger is a 86 y.o. female who presents for Medicare Annual (Subsequent) preventive examination.  Review of Systems: N/A      I connected with the patient today by telephone and verified that I am speaking with the correct person using two identifiers. Location patient: home Location nurse: work Persons participating in the telephone visit: patient, daughter [name]), and nurse.   I discussed the limitations, risks, security and privacy concerns of performing an evaluation and management service by telephone and the availability of in person appointments. I also discussed with the patient that there may be a patient responsible charge related to this service. The patient expressed understanding and verbally consented to this telephonic visit.        Cardiac Risk Factors include: advanced age (>65men, >71 women);hypertension     Objective:    Today's Vitals   There is no height or weight on file to calculate BMI.  Advanced Directives 04/26/2020 12/08/2019 12/07/2019 12/07/2019 08/07/2019 08/06/2019 07/14/2019  Does Patient Have a Medical Advance Directive? Yes Yes Yes Yes Yes Yes Yes  Type of Paramedic of [place];Living will Living will Living will Living will;Healthcare Power of [place] will Living will -  Does patient want to make changes to medical advance directive? - No - Patient declined - - No - Patient declined - -  Copy of [place] in Chart? Yes - validated most recent copy scanned in chart (See row information) Yes - validated most recent copy scanned in chart (See row information) - Yes - validated most recent copy scanned in chart (See row information) - - -  Would patient like information on creating a medical advance directive? - - - - - - -  Pre-existing out of facility DNR order (yellow form or pink MOST form) - - - - - - -    Current Medications (verified) Outpatient Encounter Medications as of  04/26/2020  Medication Sig  . acetaminophen (TYLENOL) 500 MG tablet Take 500 mg by mouth every 6 (six) hours as needed.  . Ascorbic Acid (VITAMIN C) 1000 MG tablet Take 1,000 mg by mouth daily.  . Chlorpheniramine Maleate (ALLERGY PO) Take 1 tablet by mouth daily.   Marland Kitchen donepezil (ARICEPT) 10 MG tablet Take 1 tablet (10 mg total) by mouth at bedtime.  . hydrochlorothiazide (HYDRODIURIL) 25 MG tablet Take 1 tablet (25 mg total) by mouth daily. For blood pressure  . iron polysaccharides (NIFEREX) 150 MG capsule Take 1 capsule (150 mg total) by mouth every other day.  . losartan (COZAAR) 100 MG tablet Take 1 tablet (100 mg total) by mouth daily.  . Melatonin 10 MG TABS Take 10 mg by mouth at bedtime.  . memantine (NAMENDA) 10 MG tablet Take 1 tablet (10 mg total) by mouth 2 (two) times daily.  . metoprolol tartrate (LOPRESSOR) 25 MG tablet Take 25 mg by mouth 2 (two) times daily.  . Multiple Vitamins-Minerals ([facility] SPECTRAVITE PO) Take 1 capsule by mouth daily.  . polyethylene glycol (MIRALAX / GLYCOLAX) 17 g packet Take 17 g by mouth daily. (Patient taking differently: Take 17 g by mouth daily as needed. )  . potassium chloride (KLOR-CON M10) 10 MEQ tablet Take 2 tablets (20 mEq total) by mouth every evening. (Patient taking differently: Take 10 mEq by mouth daily. )  . Probiotic Product (PROBIOTIC PO) Take 1 tablet by mouth every other day.   . vitamin B-12 (CYANOCOBALAMIN) 1000 MCG tablet Take 1,000  mcg by mouth daily.   No facility-administered encounter medications on file as of 04/26/2020.    Allergies (verified) Alendronate sodium, Amlodipine besylate, Calcitonin (salmon), Plavix [clopidogrel bisulfate], and Simvastatin   History: Past Medical History:  Diagnosis Date  . Asymptomatic gallstones   . BRBPR (bright red blood per rectum)   . Cataract 04/13/2018   bilateral eyes  . Cholelithiasis   . Clot    RLE blood clots; s/p IVC filter  . Colitis - presumed infectious origin    one  ER visit   . Dementia ([place])   . Duodenitis   . Fall   . Gastritis   . GI bleed   . Hemorrhoid    bleeding  . Hiatal hernia   . Hyperlipidemia   . Hypertension   . Osteoarthritis   . Osteoporosis   . Stroke ([place])   . Syncope    Past Surgical History:  Procedure Laterality Date  . ABDOMINAL HYSTERECTOMY     BSO- fibroids  . ABI's     normal  . ANTERIOR APPROACH HEMI HIP ARTHROPLASTY Right 12/08/2019   Procedure: ANTERIOR APPROACH HEMI HIP ARTHROPLASTY;  Surgeon: Rod Can, MD;  Location: [facility];  Service: Orthopedics;  Laterality: Right;  . COLONOSCOPY  08/09/2019   Procedure: COLONOSCOPY;  Surgeon: Lucilla Lame, MD;  [facility];  Service: Endoscopy;;  . dexa  1/05   osteoporosis  . ESOPHAGOGASTRODUODENOSCOPY N/A 03/21/2014   Procedure: ESOPHAGOGASTRODUODENOSCOPY (EGD);  Surgeon: Milus Banister, MD;  Location: [place];  Service: Endoscopy;  Laterality: N/A;  . FLEXIBLE SIGMOIDOSCOPY N/A 08/08/2019   Procedure: FLEXIBLE SIGMOIDOSCOPY;  Surgeon: Jonathon Bellows, MD;  Location: [facility] ENDOSCOPY;  Service: Gastroenterology;  Laterality: N/A;  . FLEXIBLE SIGMOIDOSCOPY N/A 08/09/2019   Procedure: FLEXIBLE SIGMOIDOSCOPY;  Surgeon: Lucilla Lame, MD;  Location: [facility] ENDOSCOPY;  Service: Endoscopy;  Laterality: N/A;  . IR IVC FILTER PLMT / S&I /IMG GUID/MOD SED  07/06/2019  . IVC FILTER INSERTION  07/2019  . left foot brace    . WRIST FRACTURE SURGERY  2/01   R arm  . WRIST FRACTURE SURGERY Left 02/12/2018   Family History  Problem Relation Age of Onset  . Heart attack Father   . Hypertension Father   . Heart attack Mother   . Throat cancer Brother   . Breast cancer Daughter    Social History   Socioeconomic History  . Marital status: Widowed    Spouse name: Not on file  . Number of children: 5  . Years of education: 55  . Highest education level: Not on file  Occupational History  . Occupation: retired    Fish farm manager: RETIRED  Tobacco Use  . Smoking status:  Never Smoker  . Smokeless tobacco: Never Used  Vaping Use  . Vaping Use: Never used  Substance and Sexual Activity  . Alcohol use: No    Alcohol/week: 0.0 standard drinks  . Drug use: No  . Sexual activity: Never  Other Topics Concern  . Not on file  Social History Narrative   Has 4 brothers, 1 sister   Lives with 1 adult grandchild and her daughter [name]   Has 4 daughters, 1 son   Right handed   Rare caffeine    Social Determinants of Health   Financial Resource Strain: Low Risk   . Difficulty of Paying Living Expenses: Not hard at all  Food Insecurity: No Food Insecurity  . Worried About Charity fundraiser in the Last Year: Never true  .  Ran Out of Food in the Last Year: Never true  Transportation Needs: No Transportation Needs  . Lack of Transportation (Medical): No  . Lack of Transportation (Non-Medical): No  Physical Activity: Inactive  . Days of Exercise per Week: 0 days  . Minutes of Exercise per Session: 0 min  Stress: No Stress Concern Present  . Feeling of Stress : Not at all  Social Connections: Socially Isolated  . Frequency of Communication with Friends and Family: More than three times a week  . Frequency of Social Gatherings with Friends and Family: More than three times a week  . Attends Religious Services: Never  . Active Member of Clubs or Organizations: No  . Attends Archivist Meetings: Never  . Marital Status: Widowed    Tobacco Counseling Counseling given: Not Answered   Clinical Intake:  Pre-visit preparation completed: Yes  Pain : No/denies pain     Nutritional Risks: None Diabetes: No  How often do you need to have someone help you when you read instructions, pamphlets, or other written materials from your doctor or pharmacy?: 1 - Never What is the last grade level you completed in school?: 11th  Diabetic: No Nutrition Risk Assessment:  Has the patient had any N/V/D within the last 2 months?  No  Does the patient  have any non-healing wounds?  No  Has the patient had any unintentional weight loss or weight gain?  No   Diabetes:  Is the patient diabetic?  No  If diabetic, was a CBG obtained today?  N/A Did the patient bring in their glucometer from home?  N/A How often do you monitor your CBG's? N/A.   Financial Strains and Diabetes Management:  Are you having any financial strains with the device, your supplies or your medication? N/A.  Does the patient want to be seen by Chronic Care Management for management of their diabetes?  N/A Would the patient like to be referred to a Nutritionist or for Diabetic Management?  N/A     Interpreter Needed?: No  Information entered by :: CJohnson, LPN   Activities of Daily Living In your present state of health, do you have any difficulty performing the following activities: 04/26/2020 12/07/2019  Hearing? N -  Vision? N -  Difficulty concentrating or making decisions? Y -  Comment has dementia -  Walking or climbing stairs? N -  Dressing or bathing? N -  Doing errands, shopping? N Y  Conservation officer, nature and eating ? N -  Using the Toilet? N -  In the past six months, have you accidently leaked urine? Y -  Comment wears depends -  Do you have problems with loss of bowel control? Y -  Comment wears depends -  Managing your Medications? N -  Managing your Finances? N -  Housekeeping or managing your Housekeeping? N -  Some recent data might be hidden    Patient Care Team: [place], Wynelle Fanny, MD as PCP - General Shirl Harris, OD as Referring Physician (Optometry)  Indicate any recent Medical Services you may have received from other than [facility] providers in the past year (date may be approximate).     Assessment:   This is a routine wellness examination for Caris.  Hearing/Vision screen  Hearing Screening   125Hz  250Hz  500Hz  1000Hz  2000Hz  3000Hz  4000Hz  6000Hz  8000Hz   Right ear:           Left ear:           Vision  Screening Comments: Patient  gets annual eye exams   Dietary issues and exercise activities discussed: Current Exercise Habits: The patient does not participate in regular exercise at present, Exercise limited by: None identified  Goals    . Patient Stated     04/15/2019, I will maintain and take medications as prescribed.     . Patient Stated     04/26/2020, I will continue to maintain and take medications as prescribed.     . Reduce sodium intake     Starting 04/13/2018, I will continue to monitor intake of sodium in diet. Daily intake should be less than 1500 mg.       Depression Screen PHQ 2/9 Scores 04/26/2020 07/14/2019 04/26/2019 04/15/2019 04/13/2018 04/09/2017 02/21/2017  PHQ - 2 Score 0 0 0 0 0 0 2  PHQ- 9 Score 0 - - 0 0 0 10    Fall Risk Fall Risk  04/26/2020 07/14/2019 04/26/2019 04/15/2019 07/29/2018  Falls in the past year? 1 1 1 1 1   Comment tripped and fell - - - -  Number falls in past yr: 1 1 0 1 0  Comment - - - - -  Injury with Fall? 1 1 0 0 1  Comment broke hip Wrist and arm pain requiring and ED visit to evaluate, previous nondisplaced humeral fx.in 2019 - - wrist fx  Risk Factor Category  - - - - -  Risk for fall due to : Medication side effect;Impaired balance/gait;History of fall(s) History of fall(s);Impaired balance/gait;Mental status change;Medication side effect Impaired balance/gait;History of fall(s);Impaired mobility History of fall(s);Impaired balance/gait;Impaired mobility;Medication side effect -  Follow up Falls evaluation completed;Falls prevention discussed Falls evaluation completed Falls evaluation completed;Falls prevention discussed;Education provided Falls evaluation completed;Falls prevention discussed -    Any stairs in or around the home? Yes  If so, are there any without handrails? No  Home free of loose throw rugs in walkways, pet beds, electrical cords, etc? Yes  Adequate lighting in your home to reduce risk of falls? Yes   ASSISTIVE DEVICES UTILIZED TO  PREVENT FALLS:  Life alert? No  Use of a cane, walker or w/c? Yes  Grab bars in the bathroom? Yes  Shower chair or bench in shower? Yes  Elevated toilet seat or a handicapped toilet? No   TIMED UP AND GO:  Was the test performed? N/A, telephonic visit .    Cognitive Function: MMSE - Mini Mental State Exam 04/26/2020 04/15/2019 04/13/2018 04/09/2017 12/05/2016  Not completed: Unable to complete Unable to complete Unable to complete (No Data) -  Orientation to time - - - - -  Orientation to Place - - - - -  Registration - - - - -  Attention/ Calculation - - - - -  Recall - - - - -  Language- name 2 objects - - - - -  Language- repeat - - - - -  Language- follow 3 step command - - - - -  Language- read & follow direction - - - - -  Write a sentence - - - - 1  Copy design - - - - 1  Total score - - - - -  Mini Cog  Mini-Cog screen was not completed. Patient has dementia. Maximum score is 22. A value of 0 denotes this part of the MMSE was not completed or the patient failed this part of the Mini-Cog screening.       Immunizations Immunization History  Administered Date(s) Administered  . Influenza Split  06/04/2011, 07/10/2012  . Influenza,inj,Quad PF,6+ Mos 07/12/2014, 08/09/2016, 04/09/2017, 04/17/2018, 04/19/2019  . Pneumococcal Conjugate-13 01/11/2014  . Pneumococcal Polysaccharide-23 06/04/2011  . Td 08/27/2004    TDAP status: Due, Education has been provided regarding the importance of this vaccine. Advised may receive this vaccine at local pharmacy or Health Dept. Aware to provide a copy of the vaccination record if obtained from local pharmacy or Health Dept. Verbalized acceptance and understanding. Flu Vaccine status:due, will get at today's visit  Pneumococcal vaccine status: Up to date Covid-19 vaccine status: Declined, Education has been provided regarding the importance of this vaccine but patient still declined. Advised may receive this vaccine at local pharmacy  or Health Dept.or vaccine clinic. Aware to provide a copy of the vaccination record if obtained from local pharmacy or Health Dept. Verbalized acceptance and understanding.  Qualifies for Shingles Vaccine? Yes   Zostavax completed No   Shingrix Completed?: No.    Education has been provided regarding the importance of this vaccine. Patient has been advised to call insurance company to determine out of pocket expense if they have not yet received this vaccine. Advised may also receive vaccine at local pharmacy or Health Dept. Verbalized acceptance and understanding.  Screening Tests Health Maintenance  Topic Date Due  . INFLUENZA VACCINE  01/30/2020  . MAMMOGRAM  04/26/2024 (Originally 11/18/2018)  . COVID-19 Vaccine (1) 05/12/2024 (Originally 10/24/1941)  . TETANUS/TDAP  08/26/2024 (Originally 08/27/2014)  . DEXA SCAN  Completed  . PNA vac Low Risk Adult  Completed    Health Maintenance  Health Maintenance Due  Topic Date Due  . INFLUENZA VACCINE  01/30/2020    Colorectal cancer screening: Completed 08/09/2019. no longer required  Mammogram status: declined Bone Density status: declined  Lung Cancer Screening: (Low Dose CT Chest recommended if Age 8-80 years, 30 pack-year currently smoking OR have quit w/in 15years.) does not qualify.    Additional Screening:  Hepatitis C Screening: does not qualify; Completed N/A  Vision Screening: Recommended annual ophthalmology exams for early detection of glaucoma and other disorders of the eye. Is the patient up to date with their annual eye exam?  Yes  Who is the provider or what is the name of the office in which the patient attends annual eye exams? Dr. Marvel Plan  If pt is not established with a provider, would they like to be referred to a provider to establish care? No .   Dental Screening: Recommended annual dental exams for proper oral hygiene  Community Resource Referral / Chronic Care Management: CRR required this visit?  No    CCM required this visit?  No      Plan:     I have personally reviewed and noted the following in the patient's chart:   . Medical and social history . Use of alcohol, tobacco or illicit drugs  . Current medications and supplements . Functional ability and status . Nutritional status . Physical activity . Advanced directives . List of other physicians . Hospitalizations, surgeries, and ER visits in previous 12 months . Vitals . Screenings to include cognitive, depression, and falls . Referrals and appointments  In addition, I have reviewed and discussed with patient certain preventive protocols, quality metrics, and best practice recommendations. A written personalized care plan for preventive services as well as general preventive health recommendations were provided to patient.   Due to this being a telephonic visit, the after visit summary with patients personalized plan was offered to patient via office or my-chart. Patient preferred  to pick up at office at next visit or via mychart.   Andrez Grime, LPN   [id number]

## 2020-04-26 NOTE — Assessment & Plan Note (Signed)
Caregivers work hard to prevent constipation and diarrhea which worsen this  Avoiding anticoagulant medications No acute bleeding recently  Cbc done today

## 2020-04-26 NOTE — Patient Instructions (Addendum)
Here is info on seroquel- a medication for hallucinations/agitatin that may help  See if it is something that you want to try  There is an increase in mortality risk but it may improve quality of life    covid vaccine is your choice  Flu shot today   Labs today

## 2020-04-26 NOTE — Progress Notes (Signed)
PCP notes:  Health Maintenance: Flu- due COVID- declined Mammogram- declined Dexa- declined   Abnormal Screenings: none   Patient concerns: Discuss her being scared at nighttime   Nurse concerns: none   Next PCP appt.: 04/26/2020 @ 2 pm

## 2020-04-26 NOTE — Assessment & Plan Note (Addendum)
Pt previously saw Dr Jaynee Eagles (neurology) Tolerating aricept and namenda well  Has 24 hour care  Starting to sundown more often with hallucinations and extreme agitation  Disc options for treatment of this prn  Given px and info on seroquel (did discuss in length the increased mortality risk with these medications)  Think it may improve quality of life  Family will consider and update  Stressed imp of keeping a regular schedule

## 2020-04-26 NOTE — Progress Notes (Signed)
Subjective:    Patient ID: Wendy Krueger, female    DOB: 1929-08-20, 86 y.o.   MRN: [id number]  This visit occurred during the SARS-CoV-2 public health emergency.  Safety protocols were in place, including screening questions prior to the visit, additional usage of staff PPE, and extensive cleaning of exam room while observing appropriate contact time as indicated for disinfecting solutions.    HPI Pt presents for annual f/u of chronic medical problems   Wt Readings from Last 3 Encounters:  04/26/20 150 lb 9 oz (68.3 kg)  01/07/20 145 lb (65.8 kg)  12/08/19 143 lb 4.8 oz (65 kg)   29.40 kg/m  Had amw on 10/27  Flu vaccine -given today covid status -has not had injections / family is nervous to give it to her  Family is immunized  She has already had covid  Zoster status -un immunized (worried about side effects)   Mammogram 5/19 - declines (too traumatic)  Self breast exam -not doing   dexa 1/10 - OP-declines further eval or tx / in wheelchair  Falls fx  Past distal radius fracture and femoral neck fracture   Supplements -vitamin D  Exercise -walks better now since her hip was fixed   Advanced dementia  Seen by neurology  Taking namenda and aricept  Under palliative care   There are nights where she gets agitated -cries all night and upset  Perhaps once per week  Seems to be scared in those instances  She does not like people to walk out of the room  Other days - a good sleeper (as long as family keeps her up until 8:30 or later)  Does take melatonin   occ tries to climb out of lift chair and bed  Hallucinates   No f/u planned with Dr Fatima Blank some jello to keep fluids up  Alt between constipation and diarrhea   Fiber/stool softeners   Hemorrhoids are not currently bleeding    HTN  In setting of CVA/ past DVT / carotid aneurysm  bp is stable today  No cp or palpitations or headaches or edema  No side effects to medicines  BP Readings from  Last 3 Encounters:  04/26/20 122/70  02/11/20 (!) 179/93  01/07/20 140/90   bp at home is improved overall with the addn of losartan     Taking hctz 25 mg and losartan 100 mg daily and metoprolol 25 mg bid  Pulse Readings from Last 3 Encounters:  04/26/20 75  01/07/20 88  12/21/19 70    H/o lower GI bleed (hemorrhoids)  Lab Results  Component Value Date   WBC 5.9 12/10/2019   HGB 9.3 (L) 12/10/2019   HCT 31.1 (L) 12/10/2019   MCV 94.5 12/10/2019   PLT 149 (L) 12/10/2019  taking niferex 150 mg every other day   Also chronic renal failure stage 3b Lab Results  Component Value Date   CREATININE 0.94 12/10/2019   BUN 26 (H) 12/10/2019   NA 137 12/10/2019   K 4.0 12/10/2019   CL 103 12/10/2019   CO2 25 12/10/2019   Due for labs   Patient Active Problem List   Diagnosis Date Noted  . Closed right hip fracture ([place]) 12/07/2019  . Pedal edema 08/13/2019  . Polyp of colon   . Gastrointestinal hemorrhage   . Chronic renal failure, stage 3b ([place])   . DVT of lower extremity, bilateral ([place]) 08/06/2019  . Bleeding hemorrhoids 08/06/2019  . DVT, lower extremity,  distal, acute, bilateral ([place]) 08/06/2019  . Advanced dementia ([place]) 08/02/2019  . Abnormal CT of the abdomen   . External hemorrhoids   . Transient alteration of awareness 07/14/2018  . Chronic diarrhea 02/17/2018  . History of fracture of radius 02/17/2018  . Hypokalemia 02/09/2018  . Hypotension 02/09/2018  . History of lower GI bleeding 02/09/2018  . Acute blood loss anemia 02/09/2018  . History of CVA (cerebrovascular accident) 03/10/2016  . Carotid aneurysm, right ([place]) 02/21/2016  . Lipoma of shoulder 03/27/2015  . History of closed Colles' fracture 09/06/2014  . Allergic rhinitis 06/20/2014  . Diastolic dysfunction 52/77/8242  . Iron deficiency anemia 03/25/2014  . History of GI bleed 03/21/2014  . BRBPR (bright red blood per rectum) 03/19/2014  . Anxiety 09/01/2013  . Encounter for Medicare annual  wellness exam 01/08/2013  . Gallstones 11/11/2011  . 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 Risk for falls 06/04/2011  . Constipation 03/05/2011  . Vitamin D deficiency 03/28/2009  . HYPERCHOLESTEROLEMIA, PURE 03/17/2007  . Essential hypertension 03/16/2007  . OSTEOARTHRITIS 03/16/2007  . Osteoporosis 03/16/2007   Past Medical History:  Diagnosis Date  . Asymptomatic gallstones   . BRBPR (bright red blood per rectum)   . Cataract 04/13/2018   bilateral eyes  . Cholelithiasis   . Clot    RLE blood clots; s/p IVC filter  . Colitis - presumed infectious origin    one ER visit   . Dementia ([place])   . Duodenitis   . Fall   . Gastritis   . GI bleed   . Hemorrhoid    bleeding  . Hiatal hernia   . Hyperlipidemia   . Hypertension   . Osteoarthritis   . Osteoporosis   . Stroke ([place])   . Syncope    Past Surgical History:  Procedure Laterality Date  . ABDOMINAL HYSTERECTOMY     BSO- fibroids  . ABI's     normal  . ANTERIOR APPROACH HEMI HIP ARTHROPLASTY Right 12/08/2019   Procedure: ANTERIOR APPROACH HEMI HIP ARTHROPLASTY;  Surgeon: Rod Can, MD;  Location: [facility];  Service: Orthopedics;  Laterality: Right;  . COLONOSCOPY  08/09/2019   Procedure: COLONOSCOPY;  Surgeon: Lucilla Lame, MD;  Location: [facility] ENDOSCOPY;  Service: Endoscopy;;  . dexa  1/05   osteoporosis  . ESOPHAGOGASTRODUODENOSCOPY N/A 03/21/2014   Procedure: ESOPHAGOGASTRODUODENOSCOPY (EGD);  Surgeon: Milus Banister, MD;  Location: [place];  Service: Endoscopy;  Laterality: N/A;  . FLEXIBLE SIGMOIDOSCOPY N/A 08/08/2019   Procedure: FLEXIBLE SIGMOIDOSCOPY;  Surgeon: Jonathon Bellows, MD;  Location: [facility] ENDOSCOPY;  Service: Gastroenterology;  Laterality: N/A;  . FLEXIBLE SIGMOIDOSCOPY N/A 08/09/2019   Procedure: FLEXIBLE SIGMOIDOSCOPY;  Surgeon: Lucilla Lame, MD;  Location: [facility] ENDOSCOPY;  Service: Endoscopy;  Laterality: N/A;  . IR IVC FILTER PLMT / S&I /IMG GUID/MOD SED  07/06/2019  . IVC FILTER  INSERTION  07/2019  . left foot brace    . WRIST FRACTURE SURGERY  2/01   R arm  . WRIST FRACTURE SURGERY Left 02/12/2018   Social History   Tobacco Use  . Smoking status: Never Smoker  . Smokeless tobacco: Never Used  Vaping Use  . Vaping Use: Never used  Substance Use Topics  . Alcohol use: No    Alcohol/week: 0.0 standard drinks  . Drug use: No   Family History  Problem Relation Age of Onset  . Heart attack Father   . Hypertension Father   . Heart attack Mother   . Throat cancer Brother   .  Breast cancer Daughter    Allergies  Allergen Reactions  . Alendronate Sodium Other (See Comments)    Leg pain   . Amlodipine Besylate Hives  . Calcitonin ([place]) Other (See Comments)     headache/ head pressure  . Plavix [Clopidogrel Bisulfate] Other (See Comments)    drowsy    . Simvastatin Other (See Comments)    Leg pain    Current Outpatient Medications on File Prior to Visit  Medication Sig Dispense Refill  . acetaminophen (TYLENOL) 500 MG tablet Take 500 mg by mouth every 6 (six) hours as needed.    . Ascorbic Acid (VITAMIN C) 1000 MG tablet Take 1,000 mg by mouth daily.    . Chlorpheniramine Maleate (ALLERGY PO) Take 1 tablet by mouth daily.     Marland Kitchen donepezil (ARICEPT) 10 MG tablet Take 1 tablet (10 mg total) by mouth at bedtime. 90 tablet 4  . hydrochlorothiazide (HYDRODIURIL) 25 MG tablet Take 1 tablet (25 mg total) by mouth daily. For blood pressure 30 tablet 5  . iron polysaccharides (NIFEREX) 150 MG capsule Take 1 capsule (150 mg total) by mouth every other day. 45 capsule 1  . losartan (COZAAR) 100 MG tablet Take 1 tablet (100 mg total) by mouth daily. 90 tablet 3  . Melatonin 10 MG TABS Take 10 mg by mouth at bedtime.    . memantine (NAMENDA) 10 MG tablet Take 1 tablet (10 mg total) by mouth 2 (two) times daily. 180 tablet 4  . metoprolol tartrate (LOPRESSOR) 25 MG tablet Take 25 mg by mouth 2 (two) times daily.    . Multiple Vitamins-Minerals ([employer] SPECTRAVITE  PO) Take 1 capsule by mouth daily.    . polyethylene glycol (MIRALAX / GLYCOLAX) 17 g packet Take 17 g by mouth daily. (Patient taking differently: Take 17 g by mouth daily as needed. ) 30 each 0  . potassium chloride (KLOR-CON M10) 10 MEQ tablet Take 2 tablets (20 mEq total) by mouth every evening. (Patient taking differently: Take 10 mEq by mouth daily. )    . Probiotic Product (PROBIOTIC PO) Take 1 tablet by mouth every other day.     . vitamin B-12 (CYANOCOBALAMIN) 1000 MCG tablet Take 1,000 mcg by mouth daily.     No current facility-administered medications on file prior to visit.    Review of Systems  Constitutional: Negative for activity change, appetite change, fatigue, fever and unexpected weight change.  HENT: Negative for congestion, ear pain, rhinorrhea, sinus pressure and sore throat.   Eyes: Negative for pain, redness and visual disturbance.  Respiratory: Negative for cough, shortness of breath and wheezing.   Cardiovascular: Negative for chest pain and palpitations.  Gastrointestinal: Negative for abdominal pain, blood in stool, constipation and diarrhea.  Endocrine: Negative for polydipsia and polyuria.  Genitourinary: Negative for dysuria, frequency and urgency.  Musculoskeletal: Positive for arthralgias. Negative for back pain and myalgias.  Skin: Negative for pallor and rash.  Allergic/Immunologic: Negative for environmental allergies.  Neurological: Negative for dizziness, syncope and headaches.  Hematological: Negative for adenopathy. Does not bruise/bleed easily.  Psychiatric/Behavioral: Positive for agitation, behavioral problems, confusion, hallucinations and sleep disturbance. Negative for decreased concentration and dysphoric mood. The patient is not nervous/anxious.        Objective:   Physical Exam Constitutional:      General: She is not in acute distress.    Appearance: Normal appearance. She is well-developed and normal weight. She is not ill-appearing.       Comments: Frail appearing  elderly female in wheelchair   HENT:     Head: Normocephalic and atraumatic.     Mouth/Throat:     Mouth: Mucous membranes are moist.  Eyes:     General: No scleral icterus.    Conjunctiva/sclera: Conjunctivae normal.     Pupils: Pupils are equal, round, and reactive to light.  Neck:     Thyroid: No thyromegaly.     Vascular: No carotid bruit or JVD.  Cardiovascular:     Rate and Rhythm: Normal rate and regular rhythm.     Heart sounds: Normal heart sounds. No gallop.   Pulmonary:     Effort: Pulmonary effort is normal. No respiratory distress.     Breath sounds: Normal breath sounds. No wheezing or rales.  Abdominal:     General: Bowel sounds are normal. There is no distension or abdominal bruit.     Palpations: Abdomen is soft. There is no mass.     Tenderness: There is no abdominal tenderness.  Musculoskeletal:     Cervical back: Normal range of motion and neck supple.     Right lower leg: No edema.     Left lower leg: No edema.  Lymphadenopathy:     Cervical: No cervical adenopathy.  Skin:    General: Skin is warm and dry.     Coloration: Skin is not pale.     Findings: No erythema or rash.  Neurological:     Mental Status: She is alert.     Sensory: No sensory deficit.     Coordination: Coordination normal.     Deep Tendon Reflexes: Reflexes are normal and symmetric. Reflexes normal.  Psychiatric:        Cognition and Memory: Cognition is impaired. Memory is impaired.     Comments: Dozing on and off in chair  Confused but not agitated            Assessment & Plan:   Problem List Items Addressed This Visit      Cardiovascular and Mediastinum   Essential hypertension - Primary (Chronic)   Relevant Orders   CBC with Differential/Platelet   Comprehensive metabolic panel   TSH   Arterial ischemic stroke, chronic    No longer on any anticoagulation due to GI bleed  No further acute incidents and bp is normalizing        Carotid aneurysm, right (HCC)    No clinical changes Under palliative care (adv dementia) and no longer seeing specialists       Bleeding hemorrhoids    Caregivers work hard to prevent constipation and diarrhea which worsen this  Avoiding anticoagulant medications No acute bleeding recently  Cbc done today         Nervous and Auditory   Advanced dementia ([place])    Pt previously saw Dr Jaynee Eagles (neurology) Tolerating aricept and namenda well  Has 24 hour care  Starting to sundown more often with hallucinations and extreme agitation  Disc options for treatment of this prn  Given px and info on seroquel (did discuss in length the increased mortality risk with these medications)  Think it may improve quality of life  Family will consider and update  Stressed imp of keeping a regular schedule      Relevant Medications   QUEtiapine (SEROQUEL) 25 MG tablet     Musculoskeletal and Integument   Osteoporosis    Last dexa 1/10 and declines another given severe dementia with agitation  Past distal radius fx and fn fx  Walking  better with walker now  Taking vit D-level pending  Declines other treamtne         Genitourinary   Chronic renal failure, stage 3b ([place])    Labs today  Enc good fluid intake        Other   Vitamin D deficiency    With OP  Level today      Relevant Orders   VITAMIN D 25 Hydroxy (Vit-D Deficiency, Fractures)   HYPERCHOLESTEROLEMIA, PURE    Declines treatment  At her age/palliative status decided to stop checking lipids       Iron deficiency anemia    Due to hemorrhoidal bleeding  Tolerating niferex 150 mg every other day  Labs today      Relevant Orders   CBC with Differential/Platelet   Ferritin    Other Visit Diagnoses    Need for influenza vaccination       Relevant Orders   Flu Vaccine QUAD 36+ mos IM (Completed)

## 2020-04-26 NOTE — Assessment & Plan Note (Signed)
Labs today  Enc good fluid intake

## 2020-04-26 NOTE — Assessment & Plan Note (Signed)
Declines treatment  At her age/palliative status decided to stop checking lipids

## 2020-04-26 NOTE — Assessment & Plan Note (Signed)
Last dexa 1/10 and declines another given severe dementia with agitation  Past distal radius fx and fn fx  Walking better with walker now  Taking vit D-level pending  Declines other treamtne

## 2020-04-26 NOTE — Assessment & Plan Note (Signed)
No clinical changes Under palliative care (adv dementia) and no longer seeing specialists

## 2020-04-26 NOTE — Assessment & Plan Note (Signed)
Due to hemorrhoidal bleeding  Tolerating niferex 150 mg every other day  Labs today

## 2020-04-26 NOTE — Patient Instructions (Signed)
Ms. Wendy Krueger , Thank you for taking time to come for your Medicare Wellness Visit. I appreciate your ongoing commitment to your health goals. Please review the following plan we discussed and let me know if I can assist you in the future.   Screening recommendations/referrals: Colonoscopy: Up to date, completed 08/09/2019, no longer required Mammogram: declined Bone Density: declined Recommended yearly ophthalmology/optometry visit for glaucoma screening and checkup Recommended yearly dental visit for hygiene and checkup  Vaccinations: Influenza vaccine: due, will get at today's visit  Pneumococcal vaccine: Completed series Tdap vaccine: decline (insurance) Shingles vaccine: due, check with your insurance regarding coverage if interested    Covid-19:declined  Advanced directives: copy in chart  Conditions/risks identified: hypertension  Next appointment: Follow up in one year for your annual wellness visit    Preventive Care 65 Years and Older, Female Preventive care refers to lifestyle choices and visits with your health care provider that can promote health and wellness. What does preventive care include?  A yearly physical exam. This is also called an annual well check.  Dental exams once or twice a year.  Routine eye exams. Ask your health care provider how often you should have your eyes checked.  Personal lifestyle choices, including:  Daily care of your teeth and gums.  Regular physical activity.  Eating a healthy diet.  Avoiding tobacco and drug use.  Limiting alcohol use.  Practicing safe sex.  Taking low-dose aspirin every day.  Taking vitamin and mineral supplements as recommended by your health care provider. What happens during an annual well check? The services and screenings done by your health care provider during your annual well check will depend on your age, overall health, lifestyle risk factors, and family history of disease. Counseling  Your  health care provider may ask you questions about your:  Alcohol use.  Tobacco use.  Drug use.  Emotional well-being.  Home and relationship well-being.  Sexual activity.  Eating habits.  History of falls.  Memory and ability to understand (cognition).  Work and work Statistician.  Reproductive health. Screening  You may have the following tests or measurements:  Height, weight, and BMI.  Blood pressure.  Lipid and cholesterol levels. These may be checked every 5 years, or more frequently if you are over 76 years old.  Skin check.  Lung cancer screening. You may have this screening every year starting at age 50 if you have a 30-pack-year history of smoking and currently smoke or have quit within the past 15 years.  Fecal occult blood test (FOBT) of the stool. You may have this test every year starting at age 17.  Flexible sigmoidoscopy or colonoscopy. You may have a sigmoidoscopy every 5 years or a colonoscopy every 10 years starting at age 78.  Hepatitis C blood test.  Hepatitis B blood test.  Sexually transmitted disease (STD) testing.  Diabetes screening. This is done by checking your blood sugar (glucose) after you have not eaten for a while (fasting). You may have this done every 1-3 years.  Bone density scan. This is done to screen for osteoporosis. You may have this done starting at age 31.  Mammogram. This may be done every 1-2 years. Talk to your health care provider about how often you should have regular mammograms. Talk with your health care provider about your test results, treatment options, and if necessary, the need for more tests. Vaccines  Your health care provider may recommend certain vaccines, such as:  Influenza vaccine. This is recommended  every year.  Tetanus, diphtheria, and acellular pertussis (Tdap, Td) vaccine. You may need a Td booster every 10 years.  Zoster vaccine. You may need this after age 33.  Pneumococcal 13-valent  conjugate (PCV13) vaccine. One dose is recommended after age 73.  Pneumococcal polysaccharide (PPSV23) vaccine. One dose is recommended after age 70. Talk to your health care provider about which screenings and vaccines you need and how often you need them. This information is not intended to replace advice given to you by your health care provider. Make sure you discuss any questions you have with your health care provider. Document Released: 07/14/2015 Document Revised: 03/06/2016 Document Reviewed: 04/18/2015 Elsevier Interactive Patient Education  2017 [place] Prevention in the Home Falls can cause injuries. They can happen to people of all ages. There are many things you can do to make your home safe and to help prevent falls. What can I do on the outside of my home?  Regularly fix the edges of walkways and driveways and fix any cracks.  Remove anything that might make you trip as you walk through a door, such as a raised step or threshold.  Trim any bushes or trees on the path to your home.  Use bright outdoor lighting.  Clear any walking paths of anything that might make someone trip, such as rocks or tools.  Regularly check to see if handrails are loose or broken. Make sure that both sides of any steps have handrails.  Any raised decks and porches should have guardrails on the edges.  Have any leaves, snow, or ice cleared regularly.  Use sand or salt on walking paths during winter.  Clean up any spills in your garage right away. This includes oil or grease spills. What can I do in the bathroom?  Use night lights.  Install grab bars by the toilet and in the tub and shower. Do not use towel bars as grab bars.  Use non-skid mats or decals in the tub or shower.  If you need to sit down in the shower, use a plastic, non-slip stool.  Keep the floor dry. Clean up any water that spills on the floor as soon as it happens.  Remove soap buildup in the tub or  shower regularly.  Attach bath mats securely with double-sided non-slip rug tape.  Do not have throw rugs and other things on the floor that can make you trip. What can I do in the bedroom?  Use night lights.  Make sure that you have a light by your bed that is easy to reach.  Do not use any sheets or blankets that are too big for your bed. They should not hang down onto the floor.  Have a firm chair that has side arms. You can use this for support while you get dressed.  Do not have throw rugs and other things on the floor that can make you trip. What can I do in the kitchen?  Clean up any spills right away.  Avoid walking on wet floors.  Keep items that you use a lot in easy-to-reach places.  If you need to reach something above you, use a strong step stool that has a grab bar.  Keep electrical cords out of the way.  Do not use floor polish or wax that makes floors slippery. If you must use wax, use non-skid floor wax.  Do not have throw rugs and other things on the floor that can make you trip. What  can I do with my stairs?  Do not leave any items on the stairs.  Make sure that there are handrails on both sides of the stairs and use them. Fix handrails that are broken or loose. Make sure that handrails are as long as the stairways.  Check any carpeting to make sure that it is firmly attached to the stairs. Fix any carpet that is loose or worn.  Avoid having throw rugs at the top or bottom of the stairs. If you do have throw rugs, attach them to the floor with carpet tape.  Make sure that you have a light switch at the top of the stairs and the bottom of the stairs. If you do not have them, ask someone to add them for you. What else can I do to help prevent falls?  Wear shoes that:  Do not have high heels.  Have rubber bottoms.  Are comfortable and fit you well.  Are closed at the toe. Do not wear sandals.  If you use a stepladder:  Make sure that it is fully  opened. Do not climb a closed stepladder.  Make sure that both sides of the stepladder are locked into place.  Ask someone to hold it for you, if possible.  Clearly mark and make sure that you can see:  Any grab bars or handrails.  First and last steps.  Where the edge of each step is.  Use tools that help you move around (mobility aids) if they are needed. These include:  Canes.  Walkers.  Scooters.  Crutches.  Turn on the lights when you go into a dark area. Replace any light bulbs as soon as they burn out.  Set up your furniture so you have a clear path. Avoid moving your furniture around.  If any of your floors are uneven, fix them.  If there are any pets around you, be aware of where they are.  Review your medicines with your doctor. Some medicines can make you feel dizzy. This can increase your chance of falling. Ask your doctor what other things that you can do to help prevent falls. This information is not intended to replace advice given to you by your health care provider. Make sure you discuss any questions you have with your health care provider. Document Released: 04/13/2009 Document Revised: 11/23/2015 Document Reviewed: 07/22/2014 Elsevier Interactive Patient Education  2017 [employer].

## 2020-04-27 ENCOUNTER — Telehealth: Payer: Self-pay | Admitting: *Deleted

## 2020-04-27 LAB — COMPREHENSIVE METABOLIC PANEL
ALT: 9 U/L (ref 0–35)
AST: 18 U/L (ref 0–37)
Albumin: 4 g/dL (ref 3.5–5.2)
Alkaline Phosphatase: 77 U/L (ref 39–117)
BUN: 22 mg/dL (ref 6–23)
CO2: 31 mEq/L (ref 19–32)
Calcium: 9.9 mg/dL (ref 8.4–10.5)
Chloride: 102 mEq/L (ref 96–112)
Creatinine, Ser: 1.17 mg/dL (ref 0.40–1.20)
GFR: 41.66 mL/min — ABNORMAL LOW (ref >60.00)
Glucose, Bld: 100 mg/dL — ABNORMAL HIGH (ref 70–99)
Potassium: 4.1 mEq/L (ref 3.5–5.1)
Sodium: 141 mEq/L (ref 135–145)
Total Bilirubin: 0.3 mg/dL (ref 0.2–1.2)
Total Protein: 6.6 g/dL (ref 6.0–8.3)

## 2020-04-27 LAB — CBC WITH DIFFERENTIAL/PLATELET
Basophils Absolute: 0 10*3/uL (ref 0.0–0.1)
Basophils Relative: 1 % (ref 0.0–3.0)
Eosinophils Absolute: 0.1 10*3/uL (ref 0.0–0.7)
Eosinophils Relative: 3.2 % (ref 0.0–5.0)
HCT: 39.1 % (ref 36.0–46.0)
Hemoglobin: 13 g/dL (ref 12.0–15.0)
Lymphocytes Relative: 29.5 % (ref 12.0–46.0)
Lymphs Abs: 1.4 10*3/uL (ref 0.7–4.0)
MCHC: 33.2 g/dL (ref 30.0–36.0)
MCV: 90.2 fl (ref 78.0–100.0)
Monocytes Absolute: 0.4 10*3/uL (ref 0.1–1.0)
Monocytes Relative: 8.4 % (ref 3.0–12.0)
Neutro Abs: 2.7 10*3/uL (ref 1.4–7.7)
Neutrophils Relative %: 57.9 % (ref 43.0–77.0)
Platelets: 177 10*3/uL (ref 150.0–400.0)
RBC: 4.34 Mil/uL (ref 3.87–5.11)
RDW: 15.1 % (ref 11.5–15.5)
WBC: 4.6 10*3/uL (ref 4.0–10.5)

## 2020-04-27 LAB — TSH: TSH: 2.21 u[IU]/mL (ref 0.35–4.50)

## 2020-04-27 LAB — VITAMIN D 25 HYDROXY (VIT D DEFICIENCY, FRACTURES): VITD: 35.29 ng/mL (ref 30.00–100.00)

## 2020-04-27 LAB — FERRITIN: Ferritin: 25 ng/mL (ref 10.0–291.0)

## 2020-04-27 NOTE — Telephone Encounter (Signed)
Left VM requesting pt's daughter [name] to call the office back regarding lab results

## 2020-04-30 ENCOUNTER — Other Ambulatory Visit: Payer: Self-pay | Admitting: Family Medicine

## 2020-05-10 ENCOUNTER — Telehealth: Payer: Self-pay

## 2020-05-10 NOTE — Telephone Encounter (Signed)
1030 am.  Phone call made to patient's daughter to complete telephonic visit.  No answer but I have left a message requesting a call back.  PLAN:  Awaiting call back.  If no callback, I will try again in a couple of weeks.

## 2020-05-12 ENCOUNTER — Telehealth: Payer: Self-pay

## 2020-05-12 NOTE — Telephone Encounter (Signed)
1110 am.  Message received from [facility] [facility] returning my call.  Return call made but no answer and message left.  PLAN:  Awaiting call back from [facility].

## 2020-05-24 ENCOUNTER — Ambulatory Visit: Payer: Medicare Other

## 2020-05-29 ENCOUNTER — Other Ambulatory Visit: Payer: Self-pay

## 2020-05-29 NOTE — Telephone Encounter (Signed)
Pt's daughter, [name] (on dpr), lvm at triage.  States they tried to get refill from [facility] but were told to contact the doctor's office.  Please advise.

## 2020-05-30 ENCOUNTER — Ambulatory Visit: Payer: Medicare Other

## 2020-05-30 MED ORDER — METOPROLOL TARTRATE 25 MG PO TABS: 25.0000 mg | ORAL_TABLET | Freq: Two times a day (BID) | ORAL | 1 refills | Status: DC

## 2020-06-13 DIAGNOSIS — N390 Urinary tract infection, site not specified: Secondary | ICD-10-CM | POA: Diagnosis not present

## 2020-06-13 DIAGNOSIS — F0390 Unspecified dementia without behavioral disturbance: Secondary | ICD-10-CM | POA: Diagnosis not present

## 2020-06-26 ENCOUNTER — Telehealth: Payer: Self-pay

## 2020-06-26 NOTE — Telephone Encounter (Signed)
11:47AM: Palliative care SW outreached patient/family for monthly telephonic visit.  SW left HIPPA complaint VM. Awaiting return call.  Will continue to offer palliative care support.

## 2020-07-05 ENCOUNTER — Other Ambulatory Visit: Payer: Medicare Other

## 2020-07-05 ENCOUNTER — Other Ambulatory Visit: Payer: Self-pay

## 2020-07-05 VITALS — BP 142/86 | HR 79 | Temp 98.2°F

## 2020-07-05 DIAGNOSIS — Z515 Encounter for palliative care: Secondary | ICD-10-CM

## 2020-07-05 NOTE — Progress Notes (Signed)
PATIENT NAME: Wendy Krueger DOB: Mar 04, 1930 MRN: [id number]  PRIMARY CARE PROVIDER: Abner Greenspan, MD  RESPONSIBLE PARTY:  Acct ID - Guarantor Home Phone Work Phone Relationship Acct Type  [id number] Wendy Krueger, [phone]  Self P/F     [address], [place], [place] [address]    PLAN OF CARE and INTERVENTIONS:               1.  GOALS OF CARE/ ADVANCE CARE PLANNING:  Remain home with assistance of family and private sitter.               2.  PATIENT/CAREGIVER EDUCATION:  Remote Health vs Palliative Care.               4. PERSONAL EMERGENCY PLAN:  Activate 911 for emergencies.               5.  DISEASE STATUS:  Joint visit completed with [place], SW.  [name] also present for visit.  Patient found in the bed awake.  Daughter states patient is normally out of bed by this time but she wanted to sleep in.  Patient is pleasantly confused and smiling during visit.  Patient is able to ambulate with assistance and use of gait belt.  Daughter notes the gait belt is always on the patient when she is out of the bed.  No falls are reported.  Chair and bed alarms are in place as patient does try to get up unassisted.  Po intake has remained good and patient is able to feed herself.  Family is making meals for patient.  No issues with coughing or choking are reported.  Patient does have some insomnia per daughter.  She will awaken at times during the night.  Family is using benadryl on a prn basis and also melatonin.  Benadryl is typically a last resort of no other interventions are effective.  Family will play music for patient that is calming and will occasionally settle patient so she can go back to sleep.  Reviewed medications and patient is not taking Seroquel.  Wendy Krueger states she read the education of this medication and did not feel comfortable administering.  Daughter reports ongoing issues with patient's blood pressure.  They continue to check bp's 2x daily and HTN  medication is being given as directed.  Patient does report pain at times to family.  She does not always know where the pain is located or she may give an incorrect location.  Example:  States she has leg pain but is rubbing/pointing to her arm.  Family is using prn tylenol when patient reports pain.  Overall, patient is doing well.  Family is keeping patient well engaged and in a routine.  They are having her write on a dry erase board and doing puzzles.  She enjoys sitting at the front door where the sun shines and she listens to her gospel music.    Remote Health is involved with patient care.  SW explained services with Remote and Palliative Care.  Daughter desires to continue with Remote Health at this time.  She advised on the process to bring Palliative Care back in should she need this later.   HISTORY OF PRESENT ILLNESS:  87 year old female with HTN and advanced Dementia.  Patient was previously being followed by Palliative Care monthly and PRN but will discharge from services and continue with Remote Health.  CODE STATUS: DNR ADVANCED DIRECTIVES: Yes MOST FORM: Yes PPS: 40%  PHYSICAL EXAM:   VITALS: Temp 98.2 F BP 142/86 P 79 O2 sats 93% LUNGS: CTA CARDIAC: HRR EXTREMITIES: No edema SKIN: No skin breakdown reported.  Skin warm and dry to touch.  NEURO: Pleasantly confused.       Lorenza Burton, RN

## 2020-07-05 NOTE — Progress Notes (Signed)
COMMUNITY PALLIATIVE CARE SW NOTE  PATIENT NAME: Wendy Krueger DOB: 01-12-1930 MRN: [id number]  PRIMARY CARE PROVIDER: Abner Greenspan, MD  RESPONSIBLE PARTY:  Acct ID - Guarantor Home Phone Work Phone Relationship Acct Type  [id number] JAUNICE, [phone]  Self P/F     [address], [place], [place] [address]     PLAN OF CARE and INTERVENTIONS:             1. GOALS OF CARE/ ADVANCE CARE PLANNING: Patient is a DNR. MOST form completed. Patient's goal is to remain at home with family support. 2.         SOCIAL/EMOTIONAL/SPIRITUAL ASSESSMENT/ INTERVENTIONS:  SW and RN Almyra Free met with patient in patients home for follow up visit. Patient lives in a one story mobile home. Patient has caregiver and daughters that assist with care. Daughter present during visit. Daughter updated SW and RN medical condition and changes.   Daughter shares that patient is doing well. Patient had family visit during the holidays which confused her a bit but cleared. Daughter shares that she eats well. Patient states that patients sleeps okay, she wakes up sometimes at night and daughter gives Benadryl and melatonin when needed. Daughter denies pain.    No recent medication changes. RN reviewed medications and took vitals.   Daughter shared that remote health is involved with patient at this time. SW educated daughter on remote health versus palliative care services and that the two can not coexist if providing same services. Daughter stated understanding and has chosen to continue with remote health services. Daughter very appreciative of information and care provided by palliative care over the past months.  Palliative care will discontinue services at this time. SW made daughter aware of referral process should she choose palliative care services again at a later time.  3.         PATIENT/CAREGIVER EDUCATION/ COPING:  Patient A&O with confusion. Family denies any anxiety or depression. Caregivers are  supportive of patients progressing dementia and implements cognitive stimulant activities with patient daily. 4.         PERSONAL EMERGENCY PLAN:  Family will call 9-1-1 for emergencies.  5.         COMMUNITY RESOURCES COORDINATION/ HEALTH CARE NAVIGATION:  Family manages her care. 6.         FINANCIAL/LEGAL CONCERNS/INTERVENTIONS:  None.      SOCIAL HX:  Social History   Tobacco Use  . Smoking status: Never Smoker  . Smokeless tobacco: Never Used  Substance Use Topics  . Alcohol use: No    Alcohol/week: 0.0 standard drinks    CODE STATUS: DNR ADVANCED DIRECTIVES: Y MOST FORM COMPLETE:  Y HOSPICE EDUCATION PROVIDED: N  [id number] is ambulatory with gait belt and RW. Family and caregiver assist with dressing and bathing . Patient has shower chair. Patient is able to feedself.   Time spent: 45 min      [place], [place]

## 2020-08-16 ENCOUNTER — Other Ambulatory Visit: Payer: Self-pay | Admitting: Neurology

## 2020-09-02 ENCOUNTER — Other Ambulatory Visit: Payer: Self-pay | Admitting: Neurology

## 2020-10-05 ENCOUNTER — Other Ambulatory Visit: Payer: Self-pay | Admitting: Family Medicine

## 2020-10-19 ENCOUNTER — Other Ambulatory Visit: Payer: Self-pay | Admitting: Neurology

## 2020-10-22 IMAGING — CT CT HIP*R* W/O CM
2 of 3 series · 15 of 46 positions shown, 17 images · non-contrast
Comparison: Radiograph 12/08/2019, radiograph 02/17/2018, CT
abdomen pelvis 07/05/2019

CLINICAL DATA: Fall, abnormal x-ray

EXAM:
CT OF THE RIGHT HIP WITHOUT CONTRAST
TECHNIQUE: Multidetector CT imaging of the right hip was performed according to
the standard protocol. Multiplanar CT image reconstructions were
also generated.

[Series 3: axial st · axial · 0.50mm/px · z∈[+1043,+1171]mm · 12 of 74 slices shown, 14 images]
[im 5/74  soft-tissue]
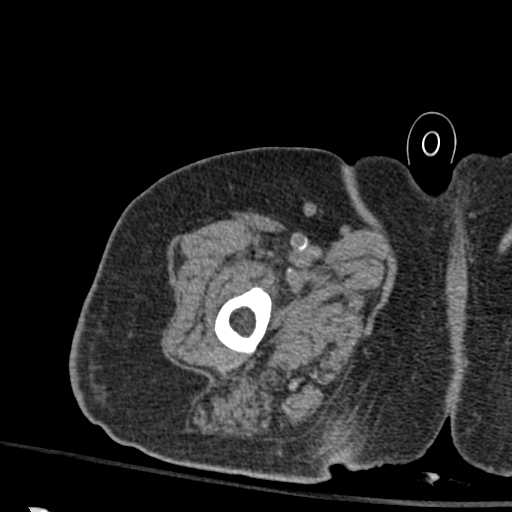
[im 5/74  bone]
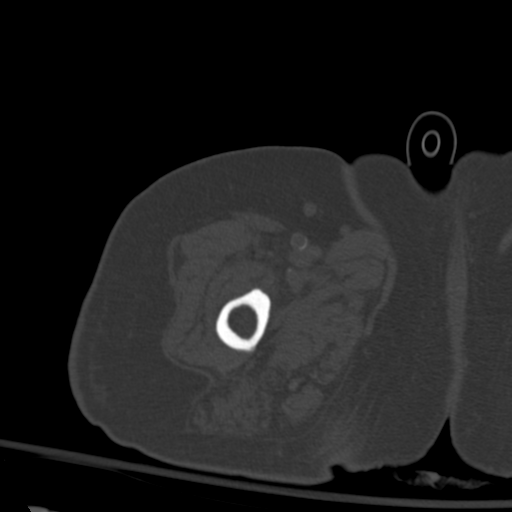
[im 10/74  soft-tissue]
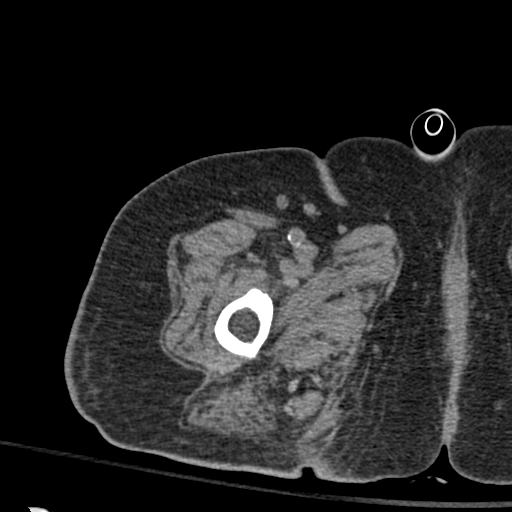
[im 17/74  soft-tissue]
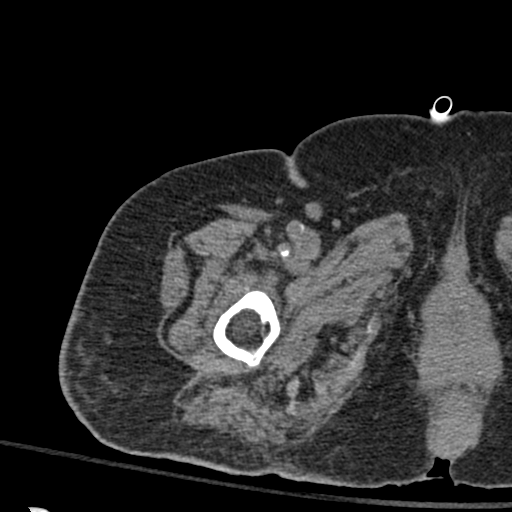
[im 22/74  soft-tissue]
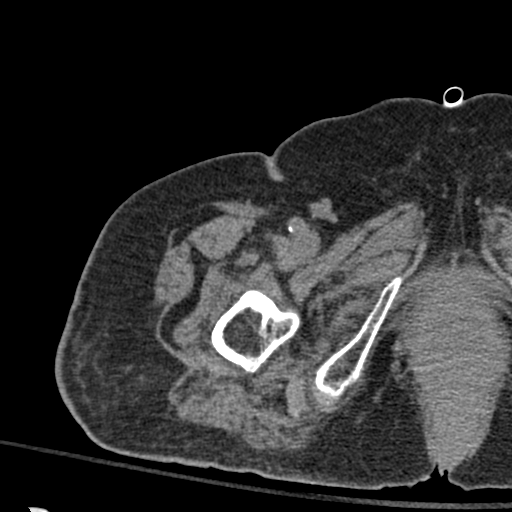
[im 29/74  soft-tissue]
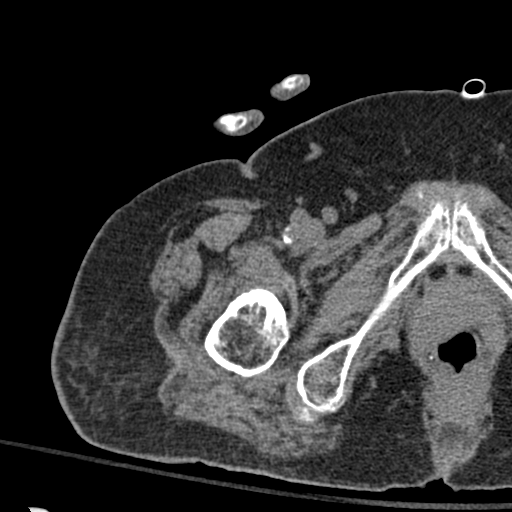
[im 33/74  soft-tissue]
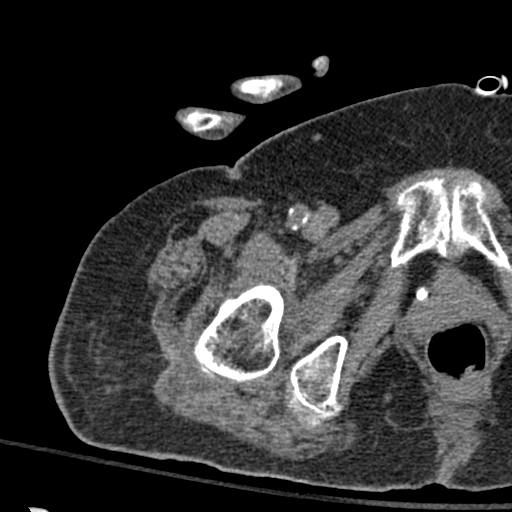
[im 41/74  soft-tissue]
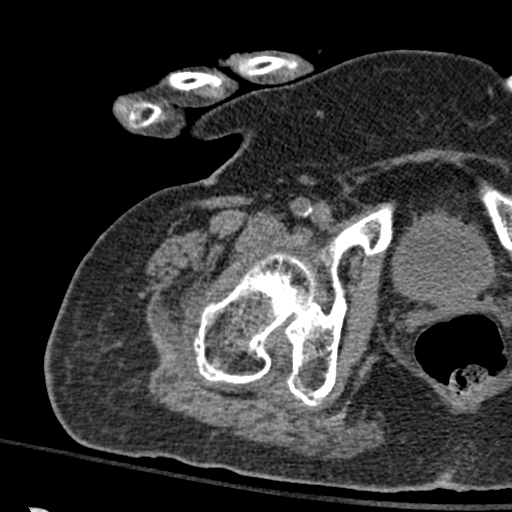
[im 45/74  soft-tissue]
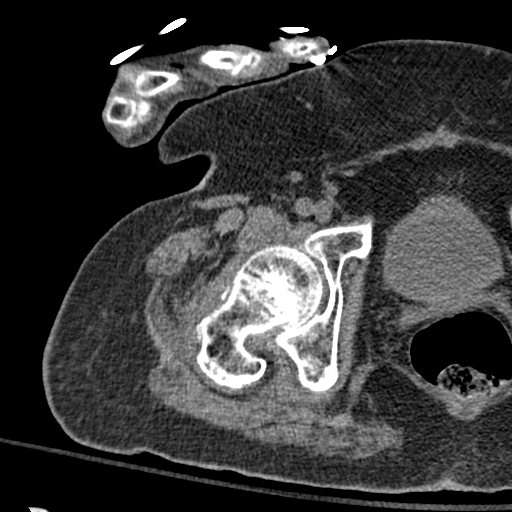
[im 52/74  soft-tissue]
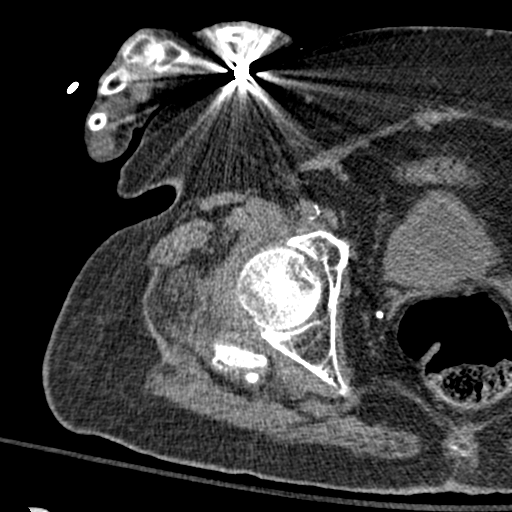
[im 52/74  bone]
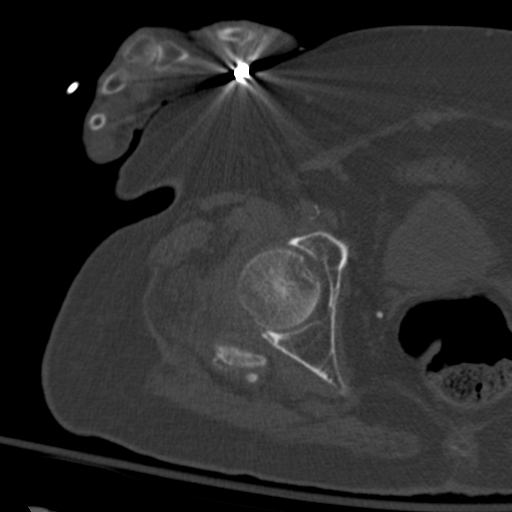
[im 57/74  soft-tissue]
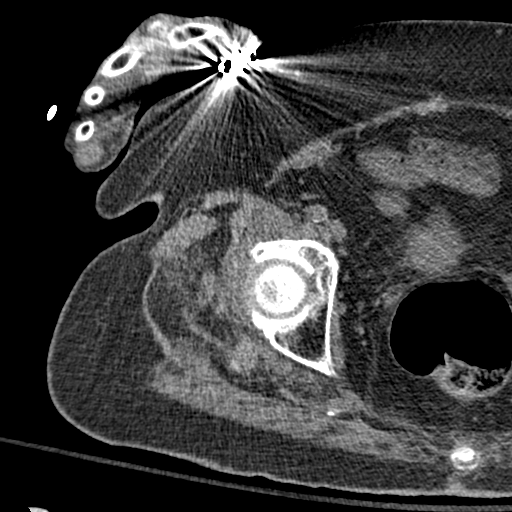
[im 64/74  soft-tissue]
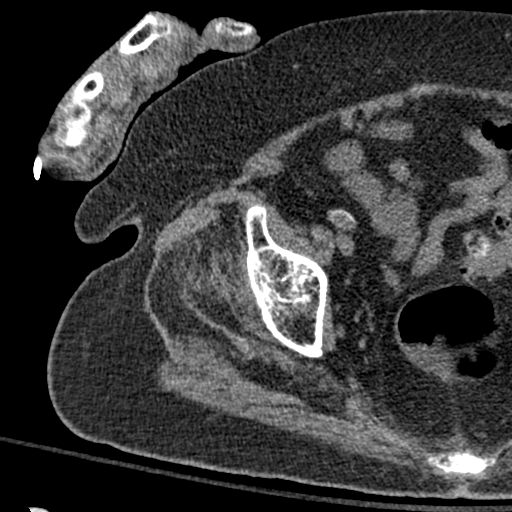
[im 69/74  soft-tissue]
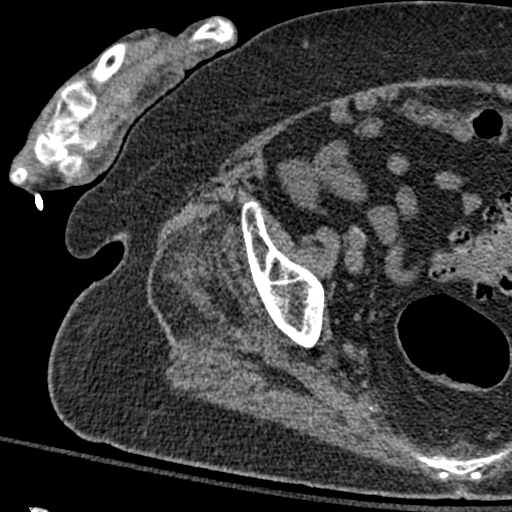

[Series 6: coronal st · coronal · 0.30mm/px · 3 of 89 slices shown]
[im 30/89  soft-tissue]
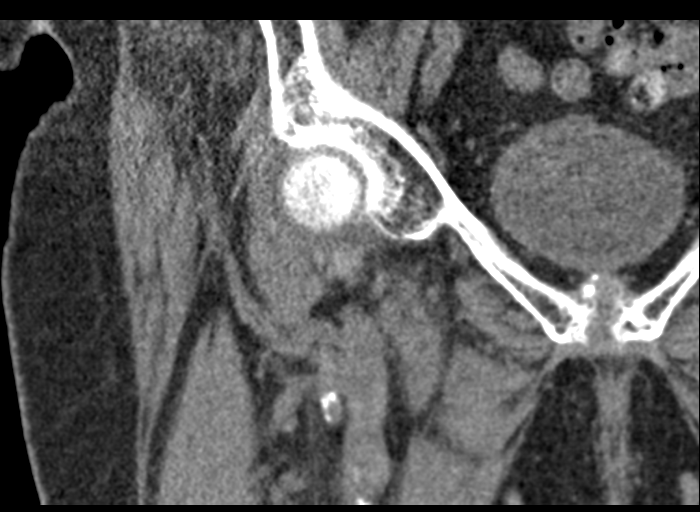
[im 40/89  soft-tissue]
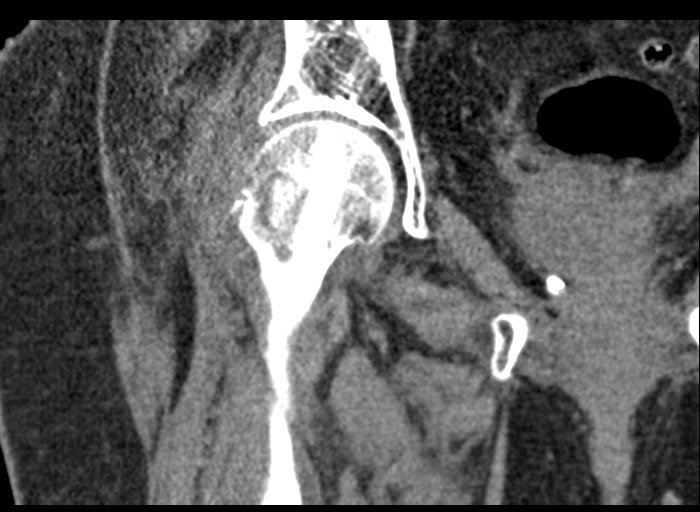
[im 49/89  soft-tissue]
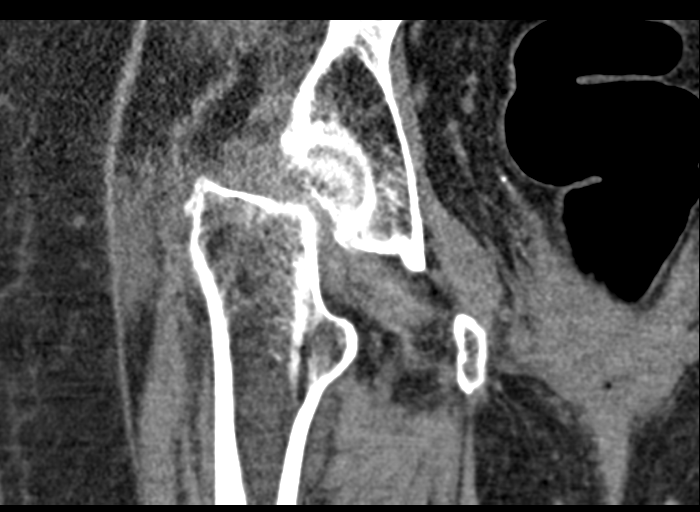

[15 of 46 positions shown; findings below may reference images not displayed]

FINDINGS: Bones/Joint/Cartilage

Impacted subcapital right femoral neck fracture with few displaced
comminuted fracture fragments contained by the joint capsule. Small
hip joint effusion is present. No other acute fracture is seen of
the proximal right femur or included portions of the bony pelvis.
Femoral head remains normally located. Background of mild
degenerative change in the right hip with some periacetabular
spurring. Sclerotic changes the and thickening at the pubic
symphysis may reflect sequela of prior osteitis pubis, nonspecific.
Enthesopathic changes present upon the ischial tuberosity and
greater trochanter.

No acute osseous abnormality of the included portion of the right
hand incidentally included within the level of imaging.

Ligaments

Suboptimally assessed by CT.

Muscles and Tendons

Fatty atrophy of the gluteus medius and minimus muscle bellies.
Milder atrophy of the gluteus maximus as well. Thickening and
edematous changes are seen near the insertions upon the greater
trochanter as well. No musculotendinous discontinuity. No
intramuscular hematoma or collection.

Soft tissues

Mild contusive changes of the lateral right hip. Included portions
of the pelvis reveals colonic diverticulosis. No acute evidence of
diverticulitis. Mild circumferential bladder wall thickening and
perivesicular hazy stranding. Postsurgical changes along the low
anterior pelvis. Atherosclerotic calcification of the proximal
inflow and outflow vessels.
IMPRESSION: 1. Impacted subcapital right femoral neck fracture with few
displaced comminuted fracture fragments contained by the joint
capsule.
2. Small to moderate right hip effusion.
3. Thickening and edema near insertions of the gluteal musculature
upon the greater trochanter and mildly within the origin of the
vastus lateralis as well.
4. Chronic appearing fatty atrophy of the gluteus medius and minimus
and with milder change within the gluteus maximus.
5. Mild circumferential bladder wall thickening and perivesicular
hazy stranding. Correlate with urinalysis to exclude cystitis.
6. Colonic diverticulosis without evidence of diverticulitis.
7. Aortic Atherosclerosis ([id number]).

## 2020-10-25 ENCOUNTER — Other Ambulatory Visit: Payer: Self-pay | Admitting: Family Medicine

## 2020-11-24 ENCOUNTER — Other Ambulatory Visit: Payer: Self-pay | Admitting: Family Medicine

## 2020-11-24 ENCOUNTER — Telehealth: Payer: Self-pay | Admitting: *Deleted

## 2020-11-24 NOTE — Chronic Care Management (AMB) (Signed)
  Chronic Care Management   Outreach Note  11/24/2020 Name: Wendy Krueger MRN: [id number] DOB: 1929-11-03  Wendy Krueger is a 87 y.o. year old female who is a primary care patient of Tower, Wynelle Fanny, MD. I reached out to Wilmer Floor by phone today in response to a referral sent by Ms. Delray Alt Mersereau's PCP, Tower, Wynelle Fanny, MD.     An unsuccessful telephone outreach was attempted today. The patient was referred to the case management team for assistance with care management and care coordination.   Follow Up Plan: A HIPAA compliant phone message was left for the patient providing contact information and requesting a return call. The care management team will reach out to the patient again over the next 7 days. If patient returns call to provider office, please advise to call [facility] at [phone].  [facility]

## 2020-11-29 ENCOUNTER — Other Ambulatory Visit: Payer: Self-pay | Admitting: Neurology

## 2020-12-01 NOTE — Chronic Care Management (AMB) (Signed)
  Chronic Care Management   Note  12/01/2020 Name: Wendy Krueger MRN: [id number] DOB: 1929/08/02  Wendy Krueger is a 87 y.o. year old female who is a primary care patient of Tower, Wynelle Fanny, MD. I reached out to Wilmer Floor by phone today in response to a referral sent by Ms. Wendy Krueger's PCP, Dr. Glori Bickers.     [name] was given information about Chronic Care Management services today including:  1. CCM service includes personalized support from designated clinical staff supervised by her physician, including individualized plan of care and coordination with other care providers 2. 24/7 contact phone numbers for assistance for urgent and routine care needs. 3. Service will only be billed when office clinical staff spend 20 minutes or more in a month to coordinate care. 4. Only one practitioner may furnish and bill the service in a calendar month. 5. The patient may stop CCM services at any time (effective at the end of the month) by phone call to the office staff. 6. The patient will be responsible for cost sharing (co-pay) of up to 20% of the service fee (after annual deductible is met).  Verified by daughter McMath,Brenda DPR on file verbally agreed to assistance and services provided by embedded care coordination/care management team today.  Follow up plan: Telephone appointment with care management team member scheduled for:12/08/2020  [facility]

## 2020-12-04 ENCOUNTER — Other Ambulatory Visit: Payer: Self-pay | Admitting: Family Medicine

## 2020-12-08 ENCOUNTER — Ambulatory Visit (INDEPENDENT_AMBULATORY_CARE_PROVIDER_SITE_OTHER): Payer: Medicare Other

## 2020-12-08 DIAGNOSIS — I10 Essential (primary) hypertension: Secondary | ICD-10-CM

## 2020-12-08 DIAGNOSIS — F0390 Unspecified dementia without behavioral disturbance: Secondary | ICD-10-CM

## 2020-12-08 DIAGNOSIS — F03C0 Unspecified dementia, severe, without behavioral disturbance, psychotic disturbance, mood disturbance, and anxiety: Secondary | ICD-10-CM

## 2020-12-08 NOTE — Patient Instructions (Signed)
Visit Information:  Thank you for taking the time to speak with me today   PATIENT GOALS:   Goals Addressed             This Visit's Progress    Prevent Falls-Dementia   On track    Timeframe:  Long-Range Goal Priority:  High Start Date:   12/08/2020                          Expected End Date: 02/28/2021                      Follow Up Date 0714/022   - Follow up with your providers as recommended. - Take your medications as prescribed. - Remove any potentially dangerous tools, utensil, etc from environment - Remove or secure potential toxins.  - Reduce the risk of falls and slips (remove throw rugs, cords, wear non slip shoes, use ambulating device(walker, wheelchair) as recommended. - Keep areas in home well lite. - Keep doors that lead outside locked    Why is this important?   There may be trouble with balance and getting around. Falls can happen.    Notes:       Track and Manage My Blood Pressure-Hypertension   On track    Timeframe:  Long-Range Goal Priority:  Medium Start Date:  12/08/2020                           Expected End Date:  02/28/2021                     Follow Up Date 01/11/2021  - check blood pressure daily - write blood pressure results in a log or diary - Take medications as prescribed and refill medications timely - Follow up with providers as recommended - Notify provider for any new or ongoing symptoms.  - Review education material on Hypertension and low salt diet in your MyChart    Why is this important?   You won't feel high blood pressure, but it can still hurt your blood vessels.  High blood pressure can cause heart or kidney problems. It can also cause a stroke.  Making lifestyle changes like losing a little weight or eating less salt will help.  Checking your blood pressure at home and at different times of the day can help to control blood pressure.  If the doctor prescribes medicine remember to take it the way the doctor ordered.  Call  the office if you cannot afford the medicine or if there are questions about it.              Consent to CCM Services: Ms. Nygren was given information about Chronic Care Management services today including:  CCM service includes personalized support from designated clinical staff supervised by her physician, including individualized plan of care and coordination with other care providers 24/7 contact phone numbers for assistance for urgent and routine care needs. Service will only be billed when office clinical staff spend 20 minutes or more in a month to coordinate care. Only one practitioner may furnish and bill the service in a calendar month. The patient may stop CCM services at any time (effective at the end of the month) by phone call to the office staff. The patient will be responsible for cost sharing (co-pay) of up to 20% of the service fee (after annual deductible is met).  Patient agreed to services and verbal consent obtained.   Patient verbalizes understanding of instructions provided today and agrees to view in [place].   The patient has been provided with contact information for the care management team and has been advised to call with any health related questions or concerns.  The care management team will reach out to the patient again over the next 30 days.   Quinn Plowman RN,BSN,CCM RN Case Manager [facility]  [phone]   CLINICAL CARE PLAN: Patient Care Plan: Dementia (Adult)   Problem Identified: Harm or Injury   Priority: High   Long-Range Goal: Harm or Injury Prevented   Start Date: 12/08/2020  Expected End Date: 02/28/2021  This Visit's Progress: On track  Priority: High  Current Barriers: 87 year old with history of Advanced Dementia. Telephone call to patient's daughter / designated party release, Oncologist.  Daughter states patient has and Advanced Directive, has a DNR order and  a MOST form in place.  Daughter states patient  currently lives with her daughter and requires full care.  She states patient has a caregiver through her long term care insurance plan.  She states this service will be ending soon. Daughter states family assists with providing care. She states patient is not able to be left alone. Daughter reports patient requires touch assistance x1 using gait belt and walker for ambulation. Daughter states patient had services in the past with [facility]/ Palliative and [facility]. [facility] services ended in January 2022 and [facility] services have also been discontinues. Daughter request to re-establish services with [facility] for patient. Daughter states she is unsure if patients primary care provider is aware of services ending. She states patients last primary care provider visit was in October 2021.  Knowledge Deficits related to basic understanding of dementia and self care management safety Unable to self administer medications as prescribed Unable to perform ADLs independently Unable to perform IADLs independently Cognitive deficits Clinical Goal(s):  Collaboration with Tower, Wynelle Fanny, MD regarding development and update of comprehensive plan of care as evidenced by provider attestation and co-signature Inter-disciplinary care team collaboration (see longitudinal plan of care) Patient/caregiver will work with care management team to address care coordination and chronic disease management needs related to dementia Dementia and Caregiver Support Level of Care Concerns   Interventions:  Evaluation of current treatment plan related to Dementia, Level of care concerns, self-management and patient's adherence to plan as established by provider. Collaboration with Tower, Wynelle Fanny, MD regarding development and update of comprehensive plan of care as evidenced by provider attestation       and co-signature Inter-disciplinary care team collaboration (see longitudinal plan of care) Discussed plans  with patient for ongoing care management follow up and provided patient with direct contact information for care management team Discussed fall precautions and home safety measure for home setting Discussed and reviewed medications. Assessed for falls Provided written and verbal information regarding dementia and safety precautions  Assessed caregiver knowledge of Dementia and care to be provided to patient.  RNCM to call [place] to assist with care coordination Patient Goals: - Follow up with your providers as recommended. - Take your medications as prescribed. - Remove any potentially dangerous tools, utensil, etc from environment - Remove or secure potential toxins.  - Reduce the risk of falls and slips (remove throw rugs, cords, wear non slip shoes, use ambulating device(walker, wheelchair) as recommended. - Keep areas in home well lite. - Keep doors  that lead outside locked  Follow Up Plan: The patient has been provided with contact information for the care management team and has been advised to call with any health related questions or concerns.  The care management team will reach out to the patient again over the next 30 days.       Patient Care Plan: Hypertension (Adult)   Problem Identified: Hypertension (Hypertension)   Priority: High   Long-Range Goal: Hypertension Monitored   Start Date: 12/08/2020  Expected End Date: 02/28/2021  This Visit's Progress: On track  Priority: Medium  Objective: 87 year old with history of hypertension and Advanced Dementia. Telephone call to patient's daughter / designated party release, Oncologist. Daughter reports patient has had hypertension for approximately 15 years or longer. She states patient's blood pressure has somewhat difficult to manage. She reports patients blood pressure may range from 120's/80's to 150's/100's.  She reports patients blood pressure today was 133/87.  Daughter reports patients blood pressure is monitored 2  x per day.  Last practice recorded BP readings:  BP Readings from Last 3 Encounters:  07/05/20 (!) 142/86  04/26/20 122/70  02/11/20 (!) 179/93  Current Barriers:  Knowledge Deficits related to basic understanding of hypertension pathophysiology and self care management Cognitive Deficits : Patient diagnosed with Advanced Dementia approximately 6-7 years ago. Patient lives with her daughter.  Patient's daughter states patient currently has caregiver assistance through her longterm care benefit  which will soon be ending. She states one of her sisters is with patient at night and she and her brother trade off during the day   Unable to self administer medications as prescribed Unable to perform ADLs independently Unable to perform IADLs independently Case Manager Clinical Goal(s):  patient will attend all scheduled medical appointments:  Patient/ caregiver will demonstrate improved adherence to prescribed treatment plan for hypertension as evidenced by taking all medications as prescribed, monitoring and recording blood pressure as directed, adhering to low sodium/DASH diet Patient/caregiver will verbalize basic understanding of hypertension disease process and self health management plan. Interventions:  Collaboration with Tower, Wynelle Fanny, MD regarding development and update of comprehensive plan of care as evidenced by provider attestation and co-signature Inter-disciplinary care team collaboration (see longitudinal plan of care) Evaluation of current treatment plan related to hypertension self management and patient's adherence to plan as established by provider. Provided education to patient/caregiver re: stroke prevention, s/s of heart attack and stroke, DASH diet, complications of uncontrolled blood pressure Reviewed medications with patient /caregiver and discussed importance of compliance Discussed plans with patient / caregiver for ongoing care management follow up and provided  patient/caregiver  with direct contact information for care management team Advised patient/ caregiver to monitor blood pressure daily and record, calling PCP for findings outside established parameters.  Reviewed scheduled/upcoming provider appointments including  Discussed importance of low salt/ DASH diet Patient Goals: - check blood pressure daily - write blood pressure results in a log or diary - Take medications as prescribed and refill medications timely - Follow up with providers as recommended - Notify provider for any new or ongoing symptoms.  - Review education material on Hypertension and low salt diet in your MyChart Follow Up Plan: The patient has been provided with contact information for the care management team and has been advised to call with any health related questions or concerns.  The care management team will reach out to the patient again over the next 30 days.

## 2020-12-08 NOTE — Chronic Care Management (AMB) (Signed)
Chronic Care Management   CCM RN Visit Note  12/08/2020 Name: Wendy Krueger MRN: [id number] DOB: Mar 10, 1930  Subjective: Wendy Krueger is a 87 y.o. year old female who is a primary care patient of Tower, Wynelle Fanny, MD. The care management team was consulted for assistance with disease management and care coordination needs.    Engaged with patient by telephone for initial visit in response to provider referral for case management and/or care coordination services.   Consent to Services:  The patient was given the following information about Chronic Care Management services today, agreed to services, and gave verbal consent: 1. CCM service includes personalized support from designated clinical staff supervised by the primary care provider, including individualized plan of care and coordination with other care providers 2. 24/7 contact phone numbers for assistance for urgent and routine care needs. 3. Service will only be billed when office clinical staff spend 20 minutes or more in a month to coordinate care. 4. Only one practitioner may furnish and bill the service in a calendar month. 5.The patient may stop CCM services at any time (effective at the end of the month) by phone call to the office staff. 6. The patient will be responsible for cost sharing (co-pay) of up to 20% of the service fee (after annual deductible is met). Patient agreed to services and consent obtained.  Patient agreed to services and verbal consent obtained.   Assessment: Review of patient past medical history, allergies, medications, health status, including review of consultants reports, laboratory and other test data, was performed as part of comprehensive evaluation and provision of chronic care management services.   SDOH (Social Determinants of Health) assessments and interventions performed:  SDOH Interventions    Flowsheet Row Most Recent Value  SDOH Interventions   Food Insecurity Interventions  Intervention Not Indicated  Financial Strain Interventions Intervention Not Indicated  Housing Interventions Intervention Not Indicated  Transportation Interventions Intervention Not Indicated        CCM Care Plan  Allergies  Allergen Reactions   Alendronate Sodium Other (See Comments)    Leg pain    Amlodipine Besylate Hives   Calcitonin ([place]) Other (See Comments)     headache/ head pressure   Plavix [Clopidogrel Bisulfate] Other (See Comments)    drowsy     Simvastatin Other (See Comments)    Leg pain     Outpatient Encounter Medications as of 12/08/2020  Medication Sig Note   acetaminophen (TYLENOL) 500 MG tablet Take 500 mg by mouth every 6 (six) hours as needed.    Ascorbic Acid (VITAMIN C) 1000 MG tablet Take 1,000 mg by mouth daily.    Chlorpheniramine Maleate (ALLERGY PO) Take 1 tablet by mouth daily.     donepezil (ARICEPT) 10 MG tablet TAKE 1 TABLET BY MOUTH AT BEDTIME NEEDS APPT FOR REFILLS    hydrochlorothiazide (HYDRODIURIL) 25 MG tablet TAKE 1 TABLET BY MOUTH EVERY DAY FOR BLOOD PRESSURE    KLOR-CON M10 10 MEQ tablet TAKE 2 TABLETS BY MOUTH DAILY    losartan (COZAAR) 100 MG tablet Take 1 tablet (100 mg total) by mouth daily.    Melatonin 10 MG TABS Take 10 mg by mouth at bedtime.    memantine (NAMENDA) 10 MG tablet TAKE 1 TABLET BY MOUTH TWICE A DAY    metoprolol tartrate (LOPRESSOR) 25 MG tablet TAKE 1 TABLET BY MOUTH TWICE A DAY    Multiple Vitamins-Minerals ([employer] SPECTRAVITE PO) Take 1 capsule by mouth daily.    polyethylene  glycol (MIRALAX / GLYCOLAX) 17 g packet Take 17 g by mouth daily. (Patient taking differently: Take 17 g by mouth daily as needed.)    Probiotic Product (PROBIOTIC PO) Take 1 tablet by mouth every other day.     vitamin B-12 (CYANOCOBALAMIN) 1000 MCG tablet Take 1,000 mcg by mouth daily.    iron polysaccharides (NIFEREX) 150 MG capsule TAKE 1 CAPSULE (150 MG TOTAL) BY MOUTH EVERY OTHER DAY. 12/08/2020: Daughter states patient takes on ly  on Tuesdays and Thursdays.   QUEtiapine (SEROQUEL) 25 MG tablet Give 1/2 to 1 pill by mouth as needed in the evening for agitation/ hallucinations (Patient not taking: No sig reported)    No facility-administered encounter medications on file as of 12/08/2020.    Patient Active Problem List   Diagnosis Date Noted   Closed right hip fracture ([place]) 12/07/2019   Pedal edema 08/13/2019   Polyp of colon    Gastrointestinal hemorrhage    Chronic renal failure, stage 3b (HCC)    DVT of lower extremity, bilateral ([place]) 08/06/2019   Bleeding hemorrhoids 08/06/2019   DVT, lower extremity, distal, acute, bilateral ([place]) 08/06/2019   Advanced dementia ([place]) 08/02/2019   Abnormal CT of the abdomen    External hemorrhoids    Transient alteration of awareness 07/14/2018   Chronic diarrhea 02/17/2018   History of fracture of radius 02/17/2018   Hypokalemia 02/09/2018   Hypotension 02/09/2018   History of lower GI bleeding 02/09/2018   Acute blood loss anemia 02/09/2018   History of CVA (cerebrovascular accident) 03/10/2016   Carotid aneurysm, right ([place]) 02/21/2016   Lipoma of shoulder 03/27/2015   History of closed Colles' fracture 09/06/2014   Allergic rhinitis 97/94/8016   Diastolic dysfunction 55/37/4827   Iron deficiency anemia 03/25/2014   History of GI bleed 03/21/2014   BRBPR (bright red blood per rectum) 03/19/2014   Anxiety 09/01/2013   Encounter for Medicare annual wellness exam 01/08/2013   Gallstones 11/11/2011   Arterial ischemic stroke, 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   Risk for falls 06/04/2011   Constipation 03/05/2011   Vitamin D deficiency 03/28/2009   HYPERCHOLESTEROLEMIA, PURE 03/17/2007   Essential hypertension 03/16/2007   OSTEOARTHRITIS 03/16/2007   Osteoporosis 03/16/2007    Conditions to be addressed/monitored:HTN and Dementia  Care Plan : Dementia (Adult)  Updates made by Dannielle Karvonen, RN since 12/08/2020 12:00 AM   Problem: Harm or Injury   Priority: High    Long-Range Goal: Harm or Injury Prevented   Start Date: 12/08/2020  Expected End Date: 02/28/2021  This Visit's Progress: On track  Priority: High  Current Barriers: 87 year old with history of Advanced Dementia. Telephone call to patient's daughter / designated party release, Oncologist.  Daughter states patient has and Advanced Directive, has a DNR order and  a MOST form in place.  Daughter states patient currently lives with her daughter and requires full care.  She states patient has a caregiver through her long term care insurance plan.  She states this service will be ending soon. Daughter states family assists with providing care. She states patient is not able to be left alone. Daughter reports patient requires touch assistance x1 using gait belt and walker for ambulation. Daughter states patient had services in the past with [facility]/ Palliative and [facility]. [facility] services ended in January 2022 and [facility] services have also been discontinues. Daughter request to re-establish services with [facility] for patient. Daughter states she is unsure if patients primary care provider  is aware of services ending. She states patients last primary care provider visit was in October 2021.  Knowledge Deficits related to basic understanding of dementia and self care management safety Unable to self administer medications as prescribed Unable to perform ADLs independently Unable to perform IADLs independently Cognitive deficits Clinical Goal(s):  Collaboration with Tower, Wynelle Fanny, MD regarding development and update of comprehensive plan of care as evidenced by provider attestation and co-signature Inter-disciplinary care team collaboration (see longitudinal plan of care) Patient/caregiver will work with care management team to address care coordination and chronic disease management needs related to dementia Dementia and Caregiver Support Level of Care Concerns    Interventions:  Evaluation of current treatment plan related to Dementia, Level of care concerns, self-management and patient's adherence to plan as established by provider. Collaboration with Tower, Wynelle Fanny, MD regarding development and update of comprehensive plan of care as evidenced by provider attestation       and co-signature Inter-disciplinary care team collaboration (see longitudinal plan of care) Discussed plans with patient for ongoing care management follow up and provided patient with direct contact information for care management team Discussed fall precautions and home safety measure for home setting Discussed and reviewed medications. Assessed for falls Provided written and verbal information regarding dementia and safety precautions  Assessed caregiver knowledge of Dementia and care to be provided to patient.  RNCM to call [place] to assist with care coordination Patient Goals: - Follow up with your providers as recommended. - Take your medications as prescribed. - Remove any potentially dangerous tools, utensil, etc from environment - Remove or secure potential toxins.  - Reduce the risk of falls and slips (remove throw rugs, cords, wear non slip shoes, use ambulating device(walker, wheelchair) as recommended. - Keep areas in home well lite. - Keep doors that lead outside locked  Follow Up Plan: The patient has been provided with contact information for the care management team and has been advised to call with any health related questions or concerns.  The care management team will reach out to the patient again over the next 30 days.       Care Plan : Hypertension (Adult)  Updates made by Dannielle Karvonen, RN since 12/08/2020 12:00 AM   Problem: Hypertension (Hypertension)   Priority: High   Long-Range Goal: Hypertension Monitored   Start Date: 12/08/2020  Expected End Date: 02/28/2021  This Visit's Progress: On track  Priority: Medium  Objective: 87 year  old with history of hypertension and Advanced Dementia. Telephone call to patient's daughter / designated party release, Oncologist. Daughter reports patient has had hypertension for approximately 15 years or longer. She states patient's blood pressure has somewhat difficult to manage. She reports patients blood pressure may range from 120's/80's to 150's/100's.  She reports patients blood pressure today was 133/87.  Daughter reports patients blood pressure is monitored 2 x per day.  Last practice recorded BP readings:  BP Readings from Last 3 Encounters:  07/05/20 (!) 142/86  04/26/20 122/70  02/11/20 (!) 179/93  Current Barriers:  Knowledge Deficits related to basic understanding of hypertension pathophysiology and self care management Cognitive Deficits : Patient diagnosed with Advanced Dementia approximately 6-7 years ago. Patient lives with her daughter.  Patient's daughter states patient currently has caregiver assistance through her longterm care benefit  which will soon be ending. She states one of her sisters is with patient at night and she and her brother trade off during the day   Unable  to self administer medications as prescribed Unable to perform ADLs independently Unable to perform IADLs independently Case Manager Clinical Goal(s):  patient will attend all scheduled medical appointments:  Patient/ caregiver will demonstrate improved adherence to prescribed treatment plan for hypertension as evidenced by taking all medications as prescribed, monitoring and recording blood pressure as directed, adhering to low sodium/DASH diet Patient/caregiver will verbalize basic understanding of hypertension disease process and self health management plan. Interventions:  Collaboration with Tower, Wynelle Fanny, MD regarding development and update of comprehensive plan of care as evidenced by provider attestation and co-signature Inter-disciplinary care team collaboration (see longitudinal plan of  care) Evaluation of current treatment plan related to hypertension self management and patient's adherence to plan as established by provider. Provided education to patient/caregiver re: stroke prevention, s/s of heart attack and stroke, DASH diet, complications of uncontrolled blood pressure Reviewed medications with patient /caregiver and discussed importance of compliance Discussed plans with patient / caregiver for ongoing care management follow up and provided patient/caregiver  with direct contact information for care management team Advised patient/ caregiver to monitor blood pressure daily and record, calling PCP for findings outside established parameters.  Reviewed scheduled/upcoming provider appointments including  Discussed importance of low salt/ DASH diet Patient Goals: - check blood pressure daily - write blood pressure results in a log or diary - Take medications as prescribed and refill medications timely - Follow up with providers as recommended - Notify provider for any new or ongoing symptoms.  - Review education material on Hypertension and low salt diet in your MyChart Follow Up Plan: The patient has been provided with contact information for the care management team and has been advised to call with any health related questions or concerns.  The care management team will reach out to the patient again over the next 30 days.       Plan:The patient / caregiver has been provided with contact information for the care management team and has been advised to call with any health related questions or concerns.  and The care management team will reach out to the patient again over the next 30 days.  Quinn Plowman RN,BSN,CCM RN Case Manager [place]  [phone]

## 2020-12-12 ENCOUNTER — Telehealth: Payer: Self-pay | Admitting: Family Medicine

## 2020-12-12 DIAGNOSIS — I10 Essential (primary) hypertension: Secondary | ICD-10-CM

## 2020-12-12 DIAGNOSIS — I6930 Unspecified sequelae of cerebral infarction: Secondary | ICD-10-CM

## 2020-12-12 DIAGNOSIS — Z8719 Personal history of other diseases of the digestive system: Secondary | ICD-10-CM

## 2020-12-12 DIAGNOSIS — N1832 Chronic kidney disease, stage 3b: Secondary | ICD-10-CM

## 2020-12-12 DIAGNOSIS — F03C0 Unspecified dementia, severe, without behavioral disturbance, psychotic disturbance, mood disturbance, and anxiety: Secondary | ICD-10-CM

## 2020-12-12 NOTE — Telephone Encounter (Signed)
-----   Message from Dannielle Karvonen, RN sent at 12/12/2020  2:05 PM EDT ----- Regarding: palliative care referral Good afternoon Dr. Glori Bickers,  I am the [facility] care management Case Manager assigned to your practice.  I spoke with your patients'  daughter/ caregiver, [name] on 12/08/2020 to open her to case management services.  I was informed by the daughter that your patient is no longer receiving services with [facility] and would therefore like to re-establish care with [facility].  Patient had palliative care with [facility] and services with [facility] earlier this year but choose to discontinue with [facility] as to not duplicate services. I spoke with an [place] representative today who stated a new referral would be needed from the primary care provider. Can you place a referral to [facility] for palliative care if you think that Ms. Earwood will benefit from these services? Thank you for your attention to this matter.   Quinn Plowman RN,BSN,CCM RN Case Manager [place]  [phone]

## 2020-12-13 ENCOUNTER — Telehealth: Payer: Self-pay

## 2020-12-13 NOTE — Telephone Encounter (Signed)
Referral placed on [facility] and they will reach out to the patient and family.

## 2020-12-13 NOTE — Telephone Encounter (Signed)
Attempted to contact patient's daughter Brenda to schedule a Palliative Care consult appointment. No answer left a message to return call.  

## 2020-12-18 ENCOUNTER — Telehealth: Payer: Self-pay

## 2020-12-18 ENCOUNTER — Other Ambulatory Visit: Payer: Self-pay | Admitting: Family Medicine

## 2020-12-18 NOTE — Telephone Encounter (Signed)
Spoke with patient's daughter [name] and scheduled an in-person Palliative Consult for 01/17/21 @ 12:30PM.  COVID screening was negative. No pets in home. Patient's daughter lives with her.   Consent obtained; updated Outlook/Netsmart/Team List and Epic.   Family is aware they may be receiving a call from NP the day before or day of to confirm appointment.

## 2020-12-20 DIAGNOSIS — M1990 Unspecified osteoarthritis, unspecified site: Secondary | ICD-10-CM | POA: Diagnosis not present

## 2020-12-20 DIAGNOSIS — I10 Essential (primary) hypertension: Secondary | ICD-10-CM | POA: Diagnosis not present

## 2020-12-20 DIAGNOSIS — F0390 Unspecified dementia without behavioral disturbance: Secondary | ICD-10-CM | POA: Diagnosis not present

## 2020-12-20 DIAGNOSIS — N1832 Chronic kidney disease, stage 3b: Secondary | ICD-10-CM | POA: Diagnosis not present

## 2020-12-20 DIAGNOSIS — K649 Unspecified hemorrhoids: Secondary | ICD-10-CM | POA: Diagnosis not present

## 2020-12-20 DIAGNOSIS — I82403 Acute embolism and thrombosis of unspecified deep veins of lower extremity, bilateral: Secondary | ICD-10-CM | POA: Diagnosis not present

## 2020-12-25 DIAGNOSIS — R1032 Left lower quadrant pain: Secondary | ICD-10-CM | POA: Diagnosis not present

## 2020-12-25 DIAGNOSIS — W06XXXD Fall from bed, subsequent encounter: Secondary | ICD-10-CM | POA: Diagnosis not present

## 2020-12-26 DIAGNOSIS — S2242XA Multiple fractures of ribs, left side, initial encounter for closed fracture: Secondary | ICD-10-CM | POA: Diagnosis not present

## 2021-01-11 ENCOUNTER — Ambulatory Visit (INDEPENDENT_AMBULATORY_CARE_PROVIDER_SITE_OTHER): Payer: Medicare Other

## 2021-01-11 DIAGNOSIS — F0390 Unspecified dementia without behavioral disturbance: Secondary | ICD-10-CM | POA: Diagnosis not present

## 2021-01-11 DIAGNOSIS — F03C0 Unspecified dementia, severe, without behavioral disturbance, psychotic disturbance, mood disturbance, and anxiety: Secondary | ICD-10-CM

## 2021-01-11 DIAGNOSIS — I10 Essential (primary) hypertension: Secondary | ICD-10-CM | POA: Diagnosis not present

## 2021-01-11 NOTE — Chronic Care Management (AMB) (Signed)
Chronic Care Management   CCM RN Visit Note  01/11/2021 Name: Wendy Krueger MRN: [id number] DOB: October 21, 1929  Subjective: Wendy Krueger is a 87 y.o. year old female who is a primary care patient of Tower, Wynelle Fanny, MD. The care management team was consulted for assistance with disease management and care coordination needs.    Engaged with patient by telephone for follow up visit in response to provider referral for case management and/or care coordination services.   Consent to Services:  The patient was given information about Chronic Care Management services, agreed to services, and gave verbal consent prior to initiation of services.  Please see initial visit note for detailed documentation.   Patient agreed to services and verbal consent obtained.   Assessment: Review of patient past medical history, allergies, medications, health status, including review of consultants reports, laboratory and other test data, was performed as part of comprehensive evaluation and provision of chronic care management services.   SDOH (Social Determinants of Health) assessments and interventions performed:    CCM Care Plan  Allergies  Allergen Reactions   Alendronate Sodium Other (See Comments)    Leg pain    Amlodipine Besylate Hives   Calcitonin ([place]) Other (See Comments)     headache/ head pressure   Plavix [Clopidogrel Bisulfate] Other (See Comments)    drowsy     Simvastatin Other (See Comments)    Leg pain     Outpatient Encounter Medications as of 01/11/2021  Medication Sig Note   acetaminophen (TYLENOL) 500 MG tablet Take 500 mg by mouth every 6 (six) hours as needed.    Ascorbic Acid (VITAMIN C) 1000 MG tablet Take 1,000 mg by mouth daily.    Chlorpheniramine Maleate (ALLERGY PO) Take 1 tablet by mouth daily.     donepezil (ARICEPT) 10 MG tablet TAKE 1 TABLET BY MOUTH AT BEDTIME NEEDS APPT FOR REFILLS    hydrochlorothiazide (HYDRODIURIL) 25 MG tablet TAKE 1 TABLET BY  MOUTH EVERY DAY FOR BLOOD PRESSURE    iron polysaccharides (NIFEREX) 150 MG capsule TAKE 1 CAPSULE (150 MG TOTAL) BY MOUTH EVERY OTHER DAY. 12/08/2020: Daughter states patient takes on ly on Tuesdays and Thursdays.   KLOR-CON M10 10 MEQ tablet TAKE 2 TABLETS BY MOUTH DAILY    losartan (COZAAR) 100 MG tablet TAKE 1 TABLET BY MOUTH EVERY DAY    Melatonin 10 MG TABS Take 10 mg by mouth at bedtime.    memantine (NAMENDA) 10 MG tablet TAKE 1 TABLET BY MOUTH TWICE A DAY    metoprolol tartrate (LOPRESSOR) 25 MG tablet TAKE 1 TABLET BY MOUTH TWICE A DAY    Multiple Vitamins-Minerals ([employer] SPECTRAVITE PO) Take 1 capsule by mouth daily.    polyethylene glycol (MIRALAX / GLYCOLAX) 17 g packet Take 17 g by mouth daily. (Patient taking differently: Take 17 g by mouth daily as needed.)    Probiotic Product (PROBIOTIC PO) Take 1 tablet by mouth every other day.     QUEtiapine (SEROQUEL) 25 MG tablet Give 1/2 to 1 pill by mouth as needed in the evening for agitation/ hallucinations (Patient not taking: No sig reported)    vitamin B-12 (CYANOCOBALAMIN) 1000 MCG tablet Take 1,000 mcg by mouth daily.    No facility-administered encounter medications on file as of 01/11/2021.    Patient Active Problem List   Diagnosis Date Noted   Closed right hip fracture ([place]) 12/07/2019   Pedal edema 08/13/2019   Polyp of colon    Gastrointestinal hemorrhage  Chronic renal failure, stage 3b (HCC)    DVT of lower extremity, bilateral ([place]) 08/06/2019   Bleeding hemorrhoids 08/06/2019   DVT, lower extremity, distal, acute, bilateral ([place]) 08/06/2019   Advanced dementia ([place]) 08/02/2019   Abnormal CT of the abdomen    External hemorrhoids    Transient alteration of awareness 07/14/2018   Chronic diarrhea 02/17/2018   History of fracture of radius 02/17/2018   Hypokalemia 02/09/2018   Hypotension 02/09/2018   History of lower GI bleeding 02/09/2018   Acute blood loss anemia 02/09/2018   History of CVA (cerebrovascular  accident) 03/10/2016   Carotid aneurysm, right ([place]) 02/21/2016   Lipoma of shoulder 03/27/2015   History of closed Colles' fracture 09/06/2014   Allergic rhinitis 84/13/2440   Diastolic dysfunction 04/27/2535   Iron deficiency anemia 03/25/2014   History of GI bleed 03/21/2014   BRBPR (bright red blood per rectum) 03/19/2014   Anxiety 09/01/2013   Encounter for Medicare annual wellness exam 01/08/2013   Gallstones 11/11/2011   Arterial ischemic stroke, 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   Risk for falls 06/04/2011   Constipation 03/05/2011   Vitamin D deficiency 03/28/2009   HYPERCHOLESTEROLEMIA, PURE 03/17/2007   Essential hypertension 03/16/2007   OSTEOARTHRITIS 03/16/2007   Osteoporosis 03/16/2007    Conditions to be addressed/monitored:HTN and Dementia  Care Plan : Dementia (Adult)  Updates made by Dannielle Karvonen, RN since 01/11/2021 12:00 AM   Problem: Harm or Injury   Priority: High   Long-Range Goal: Harm or Injury Prevented   Start Date: 12/08/2020  Expected End Date: 03/30/2021  Recent Progress: On track  Priority: High  Current Barriers:  HIPAA verified with daughter/ care giver, [name].  Daughter states  patient had a fall since last outreach with RNCM.  Daughter states patient got out of bed and fell.  She states [facility] came out to assess patient and established patient sustained a few broken ribs.  She states patient is not totally healed but recovering well.  Daughter states patients bed monitor failed.  She states she has ordered another  monitor and attached to bed.  Daughter states patient also has an alarm on her chair.  Daughter reports patient has a caregiver 5 days per week for 5 1/2 hours. She states the family continues to provide around the clock care for patient.  Daughter states she has been contacted by [place] care and they are scheduled to come out and meet with her and patient.  Knowledge Deficits related to basic understanding of dementia and self care  management safety Unable to self administer medications as prescribed Unable to perform ADLs independently Unable to perform IADLs independently Cognitive deficits Clinical Goal(s):  Collaboration with Tower, Wynelle Fanny, MD regarding development and update of comprehensive plan of care as evidenced by provider attestation and co-signature Inter-disciplinary care team collaboration (see longitudinal plan of care) Patient/caregiver will work with care management team to address care coordination and chronic disease management needs related to dementia Dementia and Caregiver Support Level of Care Concerns   Interventions:  Evaluation of current treatment plan related to Dementia, Level of care concerns, self-management and patient's adherence to plan as established by provider. Collaboration with Tower, Wynelle Fanny, MD regarding development and update of comprehensive plan of care as evidenced by provider attestation       and co-signature Inter-disciplinary care team collaboration (see longitudinal plan of care) Discussed plans with patient for ongoing care management follow up and provided patient with direct contact information for care management  team Discussed fall precautions and home safety measure for home setting Discussed and reviewed medications. Assessed for falls Provided verbal information regarding dementia and safety precautions  Assessed caregiver knowledge of Dementia and care to be provided to patient.  Patient Goals: - Continue to follow up with your providers as recommended. - Continue to take your medications as prescribed. - Remove any potentially dangerous tools, utensil, etc from environment - Remove or secure potential toxins.  - Reduce the risk of falls and slips (remove throw rugs, cords, wear non slip shoes, use ambulating device(walker, wheelchair) as recommended. - Continue to keep areas in home well lite. - Always keep doors that lead outside locked  Follow Up  Plan: The patient has been provided with contact information for the care management team and has been advised to call with any health related questions or concerns.  The care management team will reach out to the patient again over the next 45 days.       Care Plan : Hypertension (Adult)  Updates made by Dannielle Karvonen, RN since 01/11/2021 12:00 AM   Problem: Hypertension (Hypertension)   Priority: Medium   Long-Range Goal: Hypertension Monitored   Start Date: 12/08/2020  Expected End Date: 03/30/2021  This Visit's Progress: On track  Recent Progress: On track  Priority: Medium  Objective: HIPAA verified with caregiver/ daughter, [name].  Daughter states patient's blood pressure have done very well this week. She states she is unable to give me  any readings at this time because she is not at home where the log book is.  Daughter states she continues to monitor patient's blood pressure 2 times per day.  She states patient continues to receive her medications as prescribed. RNCM asked daughter to call back with blood pressure readings.  Daughter verbalized understanding and agreement.  Last practice recorded BP readings:  BP Readings from Last 3 Encounters:  07/05/20 (!) 142/86  04/26/20 122/70  02/11/20 (!) 179/93  Current Barriers:  Knowledge Deficits related to basic understanding of hypertension pathophysiology and self care management Cognitive Deficits : Patient diagnosed with Advanced Dementia approximately 6-7 years ago. Patient lives with her daughter.  Patient's daughter states patient currently has caregiver assistance through her longterm care benefit  which will soon be ending. She states one of her sisters is with patient at night and she and her brother trade off during the day   Unable to self administer medications as prescribed Unable to perform ADLs independently Unable to perform IADLs independently Case Manager Clinical Goal(s):  patient will attend scheduled  medical appointments Patient/ caregiver will demonstrate improved adherence to prescribed treatment plan for hypertension as evidenced by taking all medications as prescribed, monitoring and recording blood pressure as directed, adhering to low sodium/DASH diet Patient/caregiver will verbalize basic understanding of hypertension disease process and self health management plan. Interventions:  Collaboration with Tower, Wynelle Fanny, MD regarding development and update of comprehensive plan of care as evidenced by provider attestation and co-signature Inter-disciplinary care team collaboration (see longitudinal plan of care) Evaluation of current treatment plan related to hypertension self management and patient's adherence to plan as established by provider. Reviewed medications with caregiver and discussed importance of compliance Discussed plans with caregiver for ongoing care management follow up and provided patient/caregiver  with direct contact information for care management team Advised  caregiver to monitor patients  blood pressure daily and record, calling PCP for findings outside established parameters.  Reviewed scheduled/upcoming provider appointments Discussed importance of low  salt/ DASH diet Patient Goals: - Continue to check blood pressure daily and  write blood pressure results in a log or diary - Continue to Take medications as prescribed and refill medications timely -  Continue to follow up with providers as recommended - Notify provider for any new or ongoing symptoms.  - Call the RN case manager with blood pressure readings/ results Follow Up Plan: The patient has been provided with contact information for the care management team and has been advised to call with any health related questions or concerns.  The care management team will reach out to the patient again over the next 30 days.      Plan:The patient has been provided with contact information for the care management  team and has been advised to call with any health related questions or concerns.  and The care management team will reach out to the patient again over the next 45 days. Quinn Plowman RN,BSN,CCM RN Case Manager [place]  [phone]

## 2021-01-11 NOTE — Patient Instructions (Signed)
Visit Information:  Thank you for taking the time to speak with me today.   PATIENT GOALS:  Goals Addressed             This Visit's Progress    Prevent Falls-Dementia   Not on track    Timeframe:  Long-Range Goal Priority:  High Start Date:   12/08/2020                          Expected End Date: 03/30/2021                    Follow Up Date 02/20/2021   - Continue to follow up with your providers as recommended. - Continue to take your medications as prescribed. - Remove any potentially dangerous tools, utensil, etc from environment - Remove or secure potential toxins.  - Reduce the risk of falls and slips (remove throw rugs, cords, wear non slip shoes, use ambulating device(walker, wheelchair) as recommended. - Continue to keep areas in home well lite. - Always keep doors that lead outside locked    Why is this important?   There may be trouble with balance and getting around. Falls can happen.    Notes:      COMPLETED: Reduce sodium intake       Track and Manage My Blood Pressure-Hypertension   On track    Timeframe:  Long-Range Goal Priority:  Medium Start Date:  12/08/2020                           Expected End Date:  03/30/2021                    Follow Up Date 02/20/2021  - Continue to check blood pressure daily and  write blood pressure results in a log or diary - Continue to Take medications as prescribed and refill medications timely -  Continue to follow up with providers as recommended - Notify provider for any new or ongoing symptoms.  - Call the RN case manager with blood pressure readings/ results   Why is this important?   You won't feel high blood pressure, but it can still hurt your blood vessels.  High blood pressure can cause heart or kidney problems. It can also cause a stroke.  Making lifestyle changes like losing a little weight or eating less salt will help.  Checking your blood pressure at home and at different times of the day can help to control  blood pressure.  If the doctor prescribes medicine remember to take it the way the doctor ordered.  Call the office if you cannot afford the medicine or if there are questions about it.             Patient verbalizes understanding of instructions provided today and agrees to view in [place].   The patient has been provided with contact information for the care management team and has been advised to call with any health related questions or concerns.  The care management team will reach out to the patient again over the next 45 days.   Quinn Plowman RN,BSN,CCM RN Case Manager [place]  [phone]

## 2021-01-17 ENCOUNTER — Other Ambulatory Visit: Payer: Medicare Other | Admitting: Hospice

## 2021-01-17 ENCOUNTER — Other Ambulatory Visit: Payer: Self-pay

## 2021-01-17 DIAGNOSIS — F0390 Unspecified dementia without behavioral disturbance: Secondary | ICD-10-CM

## 2021-01-17 DIAGNOSIS — Z515 Encounter for palliative care: Secondary | ICD-10-CM

## 2021-01-17 DIAGNOSIS — K649 Unspecified hemorrhoids: Secondary | ICD-10-CM

## 2021-01-17 NOTE — Progress Notes (Signed)
[facility] [facility] Palliative Care Consult Note Telephone: [phone]  Fax: [phone]  PATIENT NAME: Wendy Krueger [place] [address] [phone] (home)  DOB: July 08, 1929 MRN: [id number]  PRIMARY CARE PROVIDER:    Abner Greenspan, MD,  [place] [address] [phone]  REFERRING PROVIDER:   Abner Greenspan, MD [address] [place],  [place] [address] [phone]  RESPONSIBLE PARTY:   Wendy Krueger c Patient's home phone" [phone] Wendy Krueger - son, lives close by [facility] - caregiver Contact Information     Name Relation Home Work Mobile   Wendy Krueger Daughter [phone]  [phone]   Wendy Krueger Daughter [phone]    Wendy Krueger Daughter [phone]        I met face to face with patient and family at home. Palliative Care was asked to follow this patient by consultation request of  Tower, Wynelle Fanny, MD to address advance care planning, complex medical decision making and goals of care clarification. This is the initial visit.    ASSESSMENT AND / RECOMMENDATIONS:   Advance Care Planning: Our advance care planning conversation included a discussion about:    The value and importance of advance care planning  Difference between Hospice and Palliative care Exploration of goals of care in the event of a sudden injury or illness  Identification and preparation of a healthcare agent  Review and updating or creation of an  advance directive document . Decision not to resuscitate or to de-escalate disease focused treatments due to poor prognosis.  CODE STATUS: Wendy Krueger affirmed that patient is a DO NOT RESUSCITATE.  Goals of Care: Goals include to maximize quality of life and symptom management.  MOST selections include limited additional intervention, IV fluids if indicated, antibiotics if indicated, no feeding tube.  Family is interested in hospice service  in the future when patient qualifies for it  I spent 46 minutes providing this initial consultation. More than 50% of the time in this consultation was spent on counseling patient and coordinating communication. --------------------------------------------------------------------------------------------------------------------------------------  Symptom Management/Plan: Dementia: Progressing memory loss and confusion in line with dementia disease trajectory.  Encourage reminiscence and mental engagement.  Safety and fall precautions.  Continue Aricept and Namenda as ordered.  Routine CBC BMP in PCP's office. Insomnia: Managed with melatonin; effective Bleeding hemorrhage: Precipitated last week by bowel blowout following constipation, which is now resolved.  Discussion on preventing constipation and ensuring adequate hydration.  MiraLAX on hand. Follow up: Palliative care will continue to follow for complex medical decision making, advance care planning, and clarification of goals. Return 6 weeks or prn.Encouraged to call provider sooner with any concerns.   Family /Caregiver/Community Supports: Wendy Krueger and Wendy Krueger live close to patient and are involved in her care.  Strong family support network identified.  Patient is followed by remote health.  HOSPICE ELIGIBILITY/DIAGNOSIS: TBD  Chief Complaint: Initial Palliative care visit  HISTORY OF PRESENT ILLNESS:  Wendy Krueger is a 87 y.o. year old female  with multiple medical conditions including dementia, progressing/worsening with associated memory loss and confusion in line with dementia disease trajectory.  Patient often repetitive in communication, requiring cueing which is helpful.  Associated symptoms are worse as the day progresses and with change of locations for patient.  She is ambulatory with rolling walker, fast 6C.  History of hypertension, bleeding hemorrhoids, closed right hip fracture, fall, insomnia. History obtained from review of  EMR, discussion  with primary team, caregiver, family and/or Wendy Krueger.  Review and summarization of Epic records shows history from other than patient. Rest of 10 point ROS asked and negative.     Review of lab tests/diagnostics    Results for Wendy Krueger (MRN [id number]) as of 01/17/2021 16:22  Ref. Range 04/26/2020 14:50  COMPREHENSIVE METABOLIC PANEL Unknown Rpt (A)  Sodium Latest Ref Range: 135 - 145 mEq/L 141  Potassium Latest Ref Range: 3.5 - 5.1 mEq/L 4.1  Chloride Latest Ref Range: 96 - 112 mEq/L 102  CO2 Latest Ref Range: 19 - 32 mEq/L 31  Glucose Latest Ref Range: 70 - 99 mg/dL 100 (H)  BUN Latest Ref Range: 6 - 23 mg/dL 22  Creatinine Latest Ref Range: 0.40 - 1.20 mg/dL 1.17  Calcium Latest Ref Range: 8.4 - 10.5 mg/dL 9.9  Alkaline Phosphatase Latest Ref Range: 39 - 117 U/L 77  Albumin Latest Ref Range: 3.5 - 5.2 g/dL 4.0  AST Latest Ref Range: 0 - 37 U/L 18  ALT Latest Ref Range: 0 - 35 U/L 9  Total Protein Latest Ref Range: 6.0 - 8.3 g/dL 6.6  Total Bilirubin Latest Ref Range: 0.2 - 1.2 mg/dL 0.3  GFR Latest Ref Range: >60.00 mL/min 41.66 (L)    ROS General: NAD EYES: denies vision changes ENMT: denies dysphagia Cardiovascular: denies chest pain/discomfort Pulmonary: denies cough, denies SOB Abdomen: endorses good appetite, denies constipation/diarrhea GU: denies dysuria, urinary frequency MSK:  endorses weakness Skin: denies rashes or wounds Neurological: denies pain, endorses occasional insomnia Psych: Endorses positive mood Heme/lymph/immuno: denies bruises, abnormal bleeding  Physical Exam: BP 118/66 R18 P64 02 96% RA Constitutional: NAD General: Well groomed, cooperative EYES: anicteric sclera, lids intact, no discharge  ENMT: Moist mucous membrane CV: S1 S2, RRR, no LE edema Pulmonary: LCTA, no increased work of breathing, no cough, Abdomen: active BS + 4 quadrants, soft and non tender GU: no suprapubic tenderness MSK: weakness, limited  ROM Skin: warm and dry, no rashes or wounds on visible skin Neuro:  weakness, otherwise non focal, memory loss/confusion Psych: non-anxious affect Hem/lymph/immuno: no widespread bruising   PAST MEDICAL HISTORY:  Active Ambulatory Problems    Diagnosis Date Noted   Vitamin D deficiency 03/28/2009   HYPERCHOLESTEROLEMIA, PURE 03/17/2007   Essential hypertension 03/16/2007   OSTEOARTHRITIS 03/16/2007   Osteoporosis 03/16/2007   Constipation 03/05/2011   Risk for falls 06/04/2011   Arterial ischemic stroke, 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   Gallstones 11/11/2011   Encounter for Medicare annual wellness exam 01/08/2013   Anxiety 09/01/2013   BRBPR (bright red blood per rectum) 03/19/2014   History of GI bleed 44/81/8563   Diastolic dysfunction 14/97/0263   Iron deficiency anemia 03/25/2014   Allergic rhinitis 06/20/2014   History of closed Colles' fracture 09/06/2014   Lipoma of shoulder 03/27/2015   Carotid aneurysm, right ([place]) 02/21/2016   History of CVA (cerebrovascular accident) 03/10/2016   Hypokalemia 02/09/2018   Hypotension 02/09/2018   History of lower GI bleeding 02/09/2018   Acute blood loss anemia 02/09/2018   Chronic diarrhea 02/17/2018   History of fracture of radius 02/17/2018   Transient alteration of awareness 07/14/2018   Abnormal CT of the abdomen    External hemorrhoids    Advanced dementia ([place]) 08/02/2019   DVT of lower extremity, bilateral (HCC) 08/06/2019   Bleeding hemorrhoids 08/06/2019   DVT, lower extremity, distal, acute, bilateral ([place]) 08/06/2019   Chronic renal failure, stage 3b (HCC)    Polyp of colon    Gastrointestinal  hemorrhage    Pedal edema 08/13/2019   Closed right hip fracture ([place]) 12/07/2019   Resolved Ambulatory Problems    Diagnosis Date Noted   Contusion of back(922.31) 03/25/2009   LEG PAIN, RIGHT 07/23/2010   Thrombocytopenia ([place]) 01/16/2011   Dysuria 01/16/2011   Transaminitis 01/16/2011   Leg pain, bilateral 01/16/2011   Left  hand weakness 06/04/2011   Colitis 11/11/2011   Renal insufficiency 01/11/2014   Near syncope 03/19/2014   Diarrhea 03/19/2014   Melena 03/21/2014   Unspecified gastritis and gastroduodenitis without mention of hemorrhage 03/21/2014   Erosive gastritis 04/28/2014   Duodenitis with bleeding 04/28/2014   Normocytic anemia 04/28/2014   Urticaria 06/20/2014   Right hand pain 09/05/2014   Right wrist pain 09/05/2014   Skin ulcer of ankle ([place]) 03/27/2015   Vasovagal episode 02/13/2016   UTI (urinary tract infection) 02/13/2016   Syncope 02/21/2016   UTI (urinary tract infection) 02/21/2017   Fall 05/07/2017   Near syncope 05/07/2017   Contusion of scalp 05/07/2017   Contusion of sacrum 05/07/2017   Contusion of right side of back 05/07/2017   Acute encephalopathy 05/08/2017   Rectal bleeding 02/09/2018   Acute kidney injury ([place]) 02/09/2018   Cough 05/22/2018   Contusion of right knee 05/22/2018   Change in mental status 01/20/2019   UTI (urinary tract infection) 01/20/2019   Fall at home 01/20/2019   Melena 07/05/2019   Hematochezia 07/05/2019   Lower GI bleed 07/06/2019   DVT (deep venous thrombosis) ([place]) 07/13/2019   Past Medical History:  Diagnosis Date   Asymptomatic gallstones    Cataract 04/13/2018   Cholelithiasis    Clot    Colitis - presumed infectious origin    Dementia ([place])    Duodenitis    Gastritis    GI bleed    Hemorrhoid    Hiatal hernia    Hyperlipidemia    Hypertension    Osteoarthritis    Osteoporosis    Stroke ([place])     SOCIAL HX:  Social History   Tobacco Use   Smoking status: Never   Smokeless tobacco: Never  Substance Use Topics   Alcohol use: No    Alcohol/week: 0.0 standard drinks     FAMILY HX:  Family History  Problem Relation Age of Onset   Heart attack Father    Hypertension Father    Heart attack Mother    Throat cancer Brother    Breast cancer Daughter       ALLERGIES:  Allergies  Allergen Reactions    Alendronate Sodium Other (See Comments)    Leg pain    Amlodipine Besylate Hives   Calcitonin ([place]) Other (See Comments)     headache/ head pressure   Plavix [Clopidogrel Bisulfate] Other (See Comments)    drowsy     Simvastatin Other (See Comments)    Leg pain       PERTINENT MEDICATIONS:  Outpatient Encounter Medications as of 01/17/2021  Medication Sig   acetaminophen (TYLENOL) 500 MG tablet Take 500 mg by mouth every 6 (six) hours as needed.   Ascorbic Acid (VITAMIN C) 1000 MG tablet Take 1,000 mg by mouth daily.   Chlorpheniramine Maleate (ALLERGY PO) Take 1 tablet by mouth daily.    donepezil (ARICEPT) 10 MG tablet TAKE 1 TABLET BY MOUTH AT BEDTIME NEEDS APPT FOR REFILLS   hydrochlorothiazide (HYDRODIURIL) 25 MG tablet TAKE 1 TABLET BY MOUTH EVERY DAY FOR BLOOD PRESSURE   iron polysaccharides (NIFEREX) 150 MG capsule TAKE  1 CAPSULE (150 MG TOTAL) BY MOUTH EVERY OTHER DAY.   KLOR-CON M10 10 MEQ tablet TAKE 2 TABLETS BY MOUTH DAILY   losartan (COZAAR) 100 MG tablet TAKE 1 TABLET BY MOUTH EVERY DAY   Melatonin 10 MG TABS Take 10 mg by mouth at bedtime.   memantine (NAMENDA) 10 MG tablet TAKE 1 TABLET BY MOUTH TWICE A DAY   metoprolol tartrate (LOPRESSOR) 25 MG tablet TAKE 1 TABLET BY MOUTH TWICE A DAY   Multiple Vitamins-Minerals ([employer] SPECTRAVITE PO) Take 1 capsule by mouth daily.   polyethylene glycol (MIRALAX / GLYCOLAX) 17 g packet Take 17 g by mouth daily. (Patient taking differently: Take 17 g by mouth daily as needed.)   Probiotic Product (PROBIOTIC PO) Take 1 tablet by mouth every other day.    QUEtiapine (SEROQUEL) 25 MG tablet Give 1/2 to 1 pill by mouth as needed in the evening for agitation/ hallucinations (Patient not taking: No sig reported)   vitamin B-12 (CYANOCOBALAMIN) 1000 MCG tablet Take 1,000 mcg by mouth daily.   No facility-administered encounter medications on file as of 01/17/2021.   Thank you for the opportunity to participate in the care of Ms. Dorey.   The palliative care team will continue to follow. Please call our office at [phone] if we can be of additional assistance.   Note: Portions of this note were generated with [employer]. Dictation errors may occur despite best attempts at proofreading.  Teodoro Spray, NP

## 2021-02-20 ENCOUNTER — Ambulatory Visit (INDEPENDENT_AMBULATORY_CARE_PROVIDER_SITE_OTHER): Payer: Medicare Other

## 2021-02-20 DIAGNOSIS — F0390 Unspecified dementia without behavioral disturbance: Secondary | ICD-10-CM | POA: Diagnosis not present

## 2021-02-20 DIAGNOSIS — F03C0 Unspecified dementia, severe, without behavioral disturbance, psychotic disturbance, mood disturbance, and anxiety: Secondary | ICD-10-CM

## 2021-02-20 DIAGNOSIS — I10 Essential (primary) hypertension: Secondary | ICD-10-CM

## 2021-02-20 NOTE — Chronic Care Management (AMB) (Signed)
Chronic Care Management   CCM RN Visit Note  02/20/2021 Name: Wendy Krueger MRN: [id number] DOB: 1930/06/24  Subjective: Wendy Krueger is a 87 y.o. year old female who is a primary care patient of Tower, Wynelle Fanny, MD. The care management team was consulted for assistance with disease management and care coordination needs.    Engaged with patient by telephone for follow up visit in response to provider referral for case management and/or care coordination services.   Consent to Services:  The patient was given information about Chronic Care Management services, agreed to services, and gave verbal consent prior to initiation of services.  Please see initial visit note for detailed documentation.   Patient agreed to services and verbal consent obtained.   Assessment: Review of patient past medical history, allergies, medications, health status, including review of consultants reports, laboratory and other test data, was performed as part of comprehensive evaluation and provision of chronic care management services.   SDOH (Social Determinants of Health) assessments and interventions performed:    CCM Care Plan  Allergies  Allergen Reactions   Alendronate Sodium Other (See Comments)    Leg pain    Amlodipine Besylate Hives   Calcitonin ([place]) Other (See Comments)     headache/ head pressure   Plavix [Clopidogrel Bisulfate] Other (See Comments)    drowsy     Simvastatin Other (See Comments)    Leg pain     Outpatient Encounter Medications as of 02/20/2021  Medication Sig Note   acetaminophen (TYLENOL) 500 MG tablet Take 500 mg by mouth every 6 (six) hours as needed.    Ascorbic Acid (VITAMIN C) 1000 MG tablet Take 1,000 mg by mouth daily.    Chlorpheniramine Maleate (ALLERGY PO) Take 1 tablet by mouth daily.     donepezil (ARICEPT) 10 MG tablet TAKE 1 TABLET BY MOUTH AT BEDTIME NEEDS APPT FOR REFILLS    hydrochlorothiazide (HYDRODIURIL) 25 MG tablet TAKE 1 TABLET BY  MOUTH EVERY DAY FOR BLOOD PRESSURE    iron polysaccharides (NIFEREX) 150 MG capsule TAKE 1 CAPSULE (150 MG TOTAL) BY MOUTH EVERY OTHER DAY. 12/08/2020: Daughter states patient takes on ly on Tuesdays and Thursdays.   KLOR-CON M10 10 MEQ tablet TAKE 2 TABLETS BY MOUTH DAILY    losartan (COZAAR) 100 MG tablet TAKE 1 TABLET BY MOUTH EVERY DAY    Melatonin 10 MG TABS Take 10 mg by mouth at bedtime.    memantine (NAMENDA) 10 MG tablet TAKE 1 TABLET BY MOUTH TWICE A DAY    metoprolol tartrate (LOPRESSOR) 25 MG tablet TAKE 1 TABLET BY MOUTH TWICE A DAY    Multiple Vitamins-Minerals ([employer]S SPECTRAVITE PO) Take 1 capsule by mouth daily.    polyethylene glycol (MIRALAX / GLYCOLAX) 17 g packet Take 17 g by mouth daily. (Patient taking differently: Take 17 g by mouth daily as needed.)    Probiotic Product (PROBIOTIC PO) Take 1 tablet by mouth every other day.     QUEtiapine (SEROQUEL) 25 MG tablet Give 1/2 to 1 pill by mouth as needed in the evening for agitation/ hallucinations (Patient not taking: No sig reported)    vitamin B-12 (CYANOCOBALAMIN) 1000 MCG tablet Take 1,000 mcg by mouth daily.    No facility-administered encounter medications on file as of 02/20/2021.    Patient Active Problem List   Diagnosis Date Noted   Closed right hip fracture ([place]) 12/07/2019   Pedal edema 08/13/2019   Polyp of colon    Gastrointestinal hemorrhage  Chronic renal failure, stage 3b (HCC)    DVT of lower extremity, bilateral ([place]) 08/06/2019   Bleeding hemorrhoids 08/06/2019   DVT, lower extremity, distal, acute, bilateral ([place]) 08/06/2019   Advanced dementia ([place]) 08/02/2019   Abnormal CT of the abdomen    External hemorrhoids    Transient alteration of awareness 07/14/2018   Chronic diarrhea 02/17/2018   History of fracture of radius 02/17/2018   Hypokalemia 02/09/2018   Hypotension 02/09/2018   History of lower GI bleeding 02/09/2018   Acute blood loss anemia 02/09/2018   History of CVA (cerebrovascular  accident) 03/10/2016   Carotid aneurysm, right ([place]) 02/21/2016   Lipoma of shoulder 03/27/2015   History of closed Colles' fracture 09/06/2014   Allergic rhinitis [id number]   Diastolic dysfunction [id number]   Iron deficiency anemia 03/25/2014   History of GI bleed 03/21/2014   BRBPR (bright red blood per rectum) 03/19/2014   Anxiety 09/01/2013   Encounter for Medicare annual wellness exam 01/08/2013   Gallstones 11/11/2011   Arterial ischemic stroke, 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   Risk for falls 06/04/2011   Constipation 03/05/2011   Vitamin D deficiency 03/28/2009   HYPERCHOLESTEROLEMIA, PURE 03/17/2007   Essential hypertension 03/16/2007   OSTEOARTHRITIS 03/16/2007   Osteoporosis 03/16/2007    Conditions to be addressed/monitored:HTN and Dementia  Care Plan : Dementia (Adult)  Updates made by Dannielle Karvonen, RN since 02/20/2021 12:00 AM     Problem: Harm or Injury   Priority: Medium     Long-Range Goal: Harm or Injury Prevented   Start Date: 12/08/2020  Expected End Date: 05/30/2021  This Visit's Progress: On track  Recent Progress: On track  Priority: Medium  Note:   Current Barriers:  HIPAA verified with daughter/ care giver, [name].  Daughter states  patient is doing well. She reports patient is not favoring or complaining about her rib area from the fall so she feels this must be healing. She states the bed monitor for patients bed has been received and is now on the bed. Denies patient having any additional falls. Continues with palliative care. Daughter states patient's long term care plan will be running out the end of October 2022 so the caregiver services will be discontinued at that time. Daughter states the plan is for her and her brother to alternate days staying with patient.  RNCM discussed with daughter community resource options and advised her to call RNCM if assistance was needed.  Daughter verbalized understanding and appreciation.     Knowledge Deficits  related to basic understanding of dementia and self care management safety Unable to self administer medications as prescribed Unable to perform ADLs independently Unable to perform IADLs independently Cognitive deficits Clinical Goal(s):  Collaboration with Tower, Wynelle Fanny, MD regarding development and update of comprehensive plan of care as evidenced by provider attestation and co-signature Inter-disciplinary care team collaboration (see longitudinal plan of care) Patient/caregiver will work with care management team to address care coordination and chronic disease management needs related to dementia Dementia and Caregiver Support Level of Care Concerns   Interventions:  Evaluation of current treatment plan related to Dementia, Level of care concerns, self-management and patient's adherence to plan as established by provider. Collaboration with Tower, Wynelle Fanny, MD regarding development and update of comprehensive plan of care as evidenced by provider attestation       and co-signature Inter-disciplinary care team collaboration (see longitudinal plan of care) Discussed plans with patient for ongoing care management follow up and provided patient with direct  contact information for care management team Discussed fall precautions and home safety measure for home setting Discussed and reviewed medications. Assessed for falls Provided verbal information regarding dementia and safety precautions  Assessed caregiver knowledge of Dementia and care to be provided to patient.  Patient Goals: - Continue to follow up with your providers as recommended. - Continue to take your medications as prescribed. - Remove any potentially dangerous tools, utensil, etc from environment - Remove or secure potential toxins.  - Reduce the risk of falls and slips (remove throw rugs, cords, wear non slip shoes, use ambulating device(walker, wheelchair) as recommended. - Continue to keep areas in home well lite. -  Always keep doors that lead outside locked  - Contact your RN case manager for community resource needs.  Follow Up Plan: The patient has been provided with contact information for the care management team and has been advised to call with any health related questions or concerns.  The care management team will reach out to the patient again over the next 45 days.       Care Plan : Hypertension (Adult)  Updates made by Dannielle Karvonen, RN since 02/20/2021 12:00 AM     Problem: Hypertension (Hypertension)   Priority: Medium     Long-Range Goal: Hypertension Monitored   Start Date: 12/08/2020  Expected End Date: 05/30/2021  This Visit's Progress: On track  Recent Progress: On track  Priority: Medium  Note:   Objective: HIPAA verified with caregiver/ daughter, [name].  Daughter states patient is doing well. She states patient's blood pressures are ranging from 130/80 to 150/90's.   Daughter states she continues to monitor patient's blood pressure 2 times per day.  She states patient continues to receive her medications as prescribed. Patient continue to receive palliative care services with [facility].  Last practice recorded BP readings:  BP Readings from Last 3 Encounters:  07/05/20 (!) 142/86  04/26/20 122/70  02/11/20 (!) 179/93  Current Barriers:  Knowledge Deficits related to basic understanding of hypertension pathophysiology and self care management Cognitive Deficits : Patient diagnosed with Advanced Dementia approximately 6-7 years ago.  Unable to self administer medications as prescribed Unable to perform ADLs independently Unable to perform IADLs independently Case Manager Clinical Goal(s):  patient will attend scheduled medical appointments Patient/ caregiver will demonstrate improved adherence to prescribed treatment plan for hypertension as evidenced by taking all medications as prescribed, monitoring and recording blood pressure as directed, adhering to low  sodium/DASH diet Patient/caregiver will verbalize basic understanding of hypertension disease process and self health management plan. Interventions:  Collaboration with Tower, Wynelle Fanny, MD regarding development and update of comprehensive plan of care as evidenced by provider attestation and co-signature Inter-disciplinary care team collaboration (see longitudinal plan of care) Evaluation of current treatment plan related to hypertension self management and patient's adherence to plan as established by provider. Reviewed medications with caregiver and discussed importance of compliance Discussed plans with caregiver for ongoing care management follow up and provided patient/caregiver  with direct contact information for care management team Advised  caregiver to monitor patients  blood pressure daily and record, calling PCP for findings outside established parameters.  Reviewed scheduled/upcoming provider appointments Discussed importance of low salt/ DASH diet Patient Goals: - Continue to check blood pressure daily and  write blood pressure results in a log or diary - Continue to take medications as prescribed and refill medications timely -  Continue to follow up with providers as recommended - Notify provider for  any new or ongoing symptoms.  Follow Up Plan: The patient has been provided with contact information for the care management team and has been advised to call with any health related questions or concerns.  The care management team will reach out to the patient again over the next 45 days.           Plan:The patient has been provided with contact information for the care management team and has been advised to call with any health related questions or concerns.  and The care management team will reach out to the patient again over the next 45 days. Quinn Plowman RN,BSN,CCM RN Case Manager [place]  [phone]

## 2021-02-20 NOTE — Patient Instructions (Signed)
Visit Information:  Thank you for taking the time to speak with me today.   PATIENT GOALS:  Goals Addressed             This Visit's Progress    Prevent Falls-Dementia   On track    Timeframe:  Long-Range Goal Priority:  High Start Date:   12/08/2020                          Expected End Date: 05/30/2021                  Follow Up Date 03/30/2021   - Continue to follow up with your providers as recommended. - Continue to take your medications as prescribed. - Remove any potentially dangerous tools, utensil, etc from environment - Remove or secure potential toxins.  - Reduce the risk of falls and slips (remove throw rugs, cords, wear non slip shoes, use ambulating device(walker, wheelchair) as recommended. - Continue to keep areas in home well lite. - Always keep doors that lead outside locked  - Contact your RN case manager for community resource needs.    Why is this important?   There may be trouble with balance and getting around. Falls can happen.    Notes:      Track and Manage My Blood Pressure-Hypertension   On track    Timeframe:  Long-Range Goal Priority:  Medium Start Date:  12/08/2020                           Expected End Date: 05/30/2021                  Follow Up Date 03/30/2021  - Continue to check blood pressure daily and  write blood pressure results in a log or diary - Continue to take medications as prescribed and refill medications timely -  Continue to follow up with providers as recommended - Notify provider for any new or ongoing symptoms.    Why is this important?   You won't feel high blood pressure, but it can still hurt your blood vessels.  High blood pressure can cause heart or kidney problems. It can also cause a stroke.  Making lifestyle changes like losing a little weight or eating less salt will help.  Checking your blood pressure at home and at different times of the day can help to control blood pressure.  If the doctor prescribes  medicine remember to take it the way the doctor ordered.  Call the office if you cannot afford the medicine or if there are questions about it.             Patient verbalizes understanding of instructions provided today and agrees to view in [place].   The patient has been provided with contact information for the care management team and has been advised to call with any health related questions or concerns.  The care management team will reach out to the patient again over the next 45 days.   Quinn Plowman RN,BSN,CCM RN Case Manager [place]  [phone]

## 2021-02-27 ENCOUNTER — Other Ambulatory Visit: Payer: Self-pay | Admitting: Family Medicine

## 2021-03-12 ENCOUNTER — Other Ambulatory Visit: Payer: Self-pay

## 2021-03-12 ENCOUNTER — Other Ambulatory Visit: Payer: Medicare Other | Admitting: Hospice

## 2021-03-12 DIAGNOSIS — F0391 Unspecified dementia with behavioral disturbance: Secondary | ICD-10-CM | POA: Diagnosis not present

## 2021-03-12 DIAGNOSIS — R451 Restlessness and agitation: Secondary | ICD-10-CM

## 2021-03-12 DIAGNOSIS — Z515 Encounter for palliative care: Secondary | ICD-10-CM

## 2021-03-12 NOTE — Progress Notes (Signed)
[facility] Consult Note Telephone: [phone]  Fax: [phone]  PATIENT NAME: Wendy Krueger DOB: 1929/10/11 MRN: [id number]  PRIMARY CARE PROVIDER:   Abner Greenspan, MD Tower, Wynelle Fanny, MD [place],  [place] [id number]  REFERRING PROVIDER: Glori Bickers, Wynelle Fanny, MD Abner Greenspan, MD [place],  [place] [id number]  RESPONSIBLE PARTYHassan Rowan [facility] c Patient's home phone" [phone] best to call Mikki Santee - son, lives close by [name] - caregiver Contact Information     Name Relation Home Work Friendly Daughter [phone]  [phone]   Alexander Bergeron Daughter [phone]    Brantley,Betty Daughter [phone]        TELEHEALTH VISIT STATEMENT Due to the COVID-19 crisis, this visit was done via telemedicine and it was initiated and consent by this patient and or family. Video-audio (telehealth) contact was unable to be done due to technical barriers from the patient's side.  Visit is to build trust and highlight Palliative Medicine as specialized medical care for people living with serious illness, aimed at facilitating better quality of life through symptoms relief, assisting with advance care planning and complex medical decision making. [name] is with patient during visit. This is a follow up visit.  RECOMMENDATIONS/PLAN:   Advance Care Planning/Code Status: Patient is a DO NOT RESUSCITATE  Goals of Care: Goals include to maximize quality of life and symptom management.  MOST selections include limited additional intervention, IV fluids if indicated, antibiotics if indicated, no feeding tube.  Family is interested in hospice service in the future when patient qualifies for it   Symptom management/Plan:  Agitation: Likely related to dementia progression.  Patient not getting quetiapine as earlier ordered for agitation/hallucination.  [name] to go to  pharmacy and pick up the prescription and start quetiapine today as ordered.  Calm approach and de-escalation techniques discussed. Routine CBC BMP. Dementia: Memory loss/confusion progressing in line with dementia disease trajectory.  Continue ongoing supportive care.  Safety and fall precautions. Insomnia: Managed with melatonin. Follow up: Palliative care will continue to follow for complex medical decision making, advance care planning, and clarification of goals. Return 6 weeks or prn. Encouraged to call provider sooner with any concerns.  CHIEF COMPLAINT: Palliative follow up  HISTORY OF PRESENT ILLNESS:  Wendy Krueger a 87 y.o. female with multiple medical problems including agitation worsening in the last week, accompanied with hallucinations sometimes.  Agitation impairs her quality of life as she sometimes refuses care when agitated; agitation is worse at nighttime impairing her sleep; persuasion not helpful.  Patient was earlier prescribed quetiapine for agitation/hallucination but this was not picked up or started.  Patient denies pain/discomfort, no urinary symptoms; [name] reports no changes in her urination, odor or color of urine.  History of hypertension, closed right hip fracture fall, insomnia.  History obtained from review of EMR, discussion with primary team, family and/or patient. Records reviewed and summarized above. All 10 point systems reviewed and are negative except as documented in history of present illness above  Review and summarization of Epic records shows history from other than patient.   Palliative Care was asked to follow this patient o help address complex decision making in the context of advance care planning and goals of care clarification.   PERTINENT MEDICATIONS:  Outpatient Encounter Medications as of 03/12/2021  Medication Sig   acetaminophen (TYLENOL) 500 MG tablet Take 500 mg  by mouth every 6 (six) hours as needed.   Ascorbic Acid (VITAMIN C) 1000  MG tablet Take 1,000 mg by mouth daily.   Chlorpheniramine Maleate (ALLERGY PO) Take 1 tablet by mouth daily.    donepezil (ARICEPT) 10 MG tablet TAKE 1 TABLET BY MOUTH AT BEDTIME NEEDS APPT FOR REFILLS   hydrochlorothiazide (HYDRODIURIL) 25 MG tablet TAKE 1 TABLET BY MOUTH EVERY DAY FOR BLOOD PRESSURE   iron polysaccharides (NIFEREX) 150 MG capsule TAKE 1 CAPSULE (150 MG TOTAL) BY MOUTH EVERY OTHER DAY.   KLOR-CON M10 10 MEQ tablet TAKE 2 TABLETS BY MOUTH DAILY   losartan (COZAAR) 100 MG tablet TAKE 1 TABLET BY MOUTH EVERY DAY   Melatonin 10 MG TABS Take 10 mg by mouth at bedtime.   memantine (NAMENDA) 10 MG tablet TAKE 1 TABLET BY MOUTH TWICE A DAY   metoprolol tartrate (LOPRESSOR) 25 MG tablet TAKE 1 TABLET BY MOUTH TWICE A DAY   Multiple Vitamins-Minerals ([employer] SPECTRAVITE PO) Take 1 capsule by mouth daily.   polyethylene glycol (MIRALAX / GLYCOLAX) 17 g packet Take 17 g by mouth daily. (Patient taking differently: Take 17 g by mouth daily as needed.)   Probiotic Product (PROBIOTIC PO) Take 1 tablet by mouth every other day.    QUEtiapine (SEROQUEL) 25 MG tablet Give 1/2 to 1 pill by mouth as needed in the evening for agitation/ hallucinations (Patient not taking: No sig reported)   vitamin B-12 (CYANOCOBALAMIN) 1000 MCG tablet Take 1,000 mcg by mouth daily.   No facility-administered encounter medications on file as of 03/12/2021.    HOSPICE ELIGIBILITY/DIAGNOSIS: TBD  PAST MEDICAL HISTORY:  Past Medical History:  Diagnosis Date   Asymptomatic gallstones    BRBPR (bright red blood per rectum)    Cataract 04/13/2018   bilateral eyes   Cholelithiasis    Clot    RLE blood clots; s/p IVC filter   Colitis - presumed infectious origin    one ER visit    Dementia ([place])    Duodenitis    Fall    Gastritis    GI bleed    Hemorrhoid    bleeding   Hiatal hernia    Hyperlipidemia    Hypertension    Osteoarthritis    Osteoporosis    Stroke ([place])    Syncope     ALLERGIES:   Allergies  Allergen Reactions   Alendronate Sodium Other (See Comments)    Leg pain    Amlodipine Besylate Hives   Calcitonin ([place]) Other (See Comments)     headache/ head pressure   Plavix [Clopidogrel Bisulfate] Other (See Comments)    drowsy     Simvastatin Other (See Comments)    Leg pain       Thank you for the opportunity to participate in the care of Wendy Krueger Please call our office at [phone] if we can be of additional assistance.  Note: Portions of this note were generated with [employer]. Dictation errors may occur despite best attempts at proofreading.  Teodoro Spray, NP

## 2021-03-30 ENCOUNTER — Ambulatory Visit (INDEPENDENT_AMBULATORY_CARE_PROVIDER_SITE_OTHER): Payer: Medicare Other

## 2021-03-30 DIAGNOSIS — F03C0 Unspecified dementia, severe, without behavioral disturbance, psychotic disturbance, mood disturbance, and anxiety: Secondary | ICD-10-CM

## 2021-03-30 DIAGNOSIS — F0390 Unspecified dementia without behavioral disturbance: Secondary | ICD-10-CM

## 2021-03-30 DIAGNOSIS — I10 Essential (primary) hypertension: Secondary | ICD-10-CM

## 2021-03-30 NOTE — Chronic Care Management (AMB) (Signed)
Chronic Care Management   CCM RN Visit Note  03/30/2021 Name: Wendy Krueger MRN: [id number] DOB: 1929/08/18  Subjective: Wendy Krueger is a 87 y.o. year old female who is a primary care patient of Tower, Wynelle Fanny, MD. The care management team was consulted for assistance with disease management and care coordination needs.    Engaged with patient by telephone for follow up visit in response to provider referral for case management and/or care coordination services.   Consent to Services:  The patient was given information about Chronic Care Management services, agreed to services, and gave verbal consent prior to initiation of services.  Please see initial visit note for detailed documentation.   Patient agreed to services and verbal consent obtained.   Assessment: Review of patient past medical history, allergies, medications, health status, including review of consultants reports, laboratory and other test data, was performed as part of comprehensive evaluation and provision of chronic care management services.   SDOH (Social Determinants of Health) assessments and interventions performed:    CCM Care Plan  Allergies  Allergen Reactions   Alendronate Sodium Other (See Comments)    Leg pain    Amlodipine Besylate Hives   Calcitonin ([place]) Other (See Comments)     headache/ head pressure   Plavix [Clopidogrel Bisulfate] Other (See Comments)    drowsy     Simvastatin Other (See Comments)    Leg pain     Outpatient Encounter Medications as of 03/30/2021  Medication Sig Note   QUEtiapine (SEROQUEL) 25 MG tablet Give 1/2 to 1 pill by mouth as needed in the evening for agitation/ hallucinations    acetaminophen (TYLENOL) 500 MG tablet Take 500 mg by mouth every 6 (six) hours as needed.    Ascorbic Acid (VITAMIN C) 1000 MG tablet Take 1,000 mg by mouth daily.    Chlorpheniramine Maleate (ALLERGY PO) Take 1 tablet by mouth daily.     donepezil (ARICEPT) 10 MG tablet TAKE 1  TABLET BY MOUTH AT BEDTIME NEEDS APPT FOR REFILLS    hydrochlorothiazide (HYDRODIURIL) 25 MG tablet TAKE 1 TABLET BY MOUTH EVERY DAY FOR BLOOD PRESSURE    iron polysaccharides (NIFEREX) 150 MG capsule TAKE 1 CAPSULE (150 MG TOTAL) BY MOUTH EVERY OTHER DAY. 12/08/2020: Daughter states patient takes on ly on Tuesdays and Thursdays.   KLOR-CON M10 10 MEQ tablet TAKE 2 TABLETS BY MOUTH DAILY    losartan (COZAAR) 100 MG tablet TAKE 1 TABLET BY MOUTH EVERY DAY    Melatonin 10 MG TABS Take 10 mg by mouth at bedtime.    memantine (NAMENDA) 10 MG tablet TAKE 1 TABLET BY MOUTH TWICE A DAY    metoprolol tartrate (LOPRESSOR) 25 MG tablet TAKE 1 TABLET BY MOUTH TWICE A DAY    Multiple Vitamins-Minerals (CVS SPECTRAVITE PO) Take 1 capsule by mouth daily.    polyethylene glycol (MIRALAX / GLYCOLAX) 17 g packet Take 17 g by mouth daily. (Patient taking differently: Take 17 g by mouth daily as needed.)    Probiotic Product (PROBIOTIC PO) Take 1 tablet by mouth every other day.     vitamin B-12 (CYANOCOBALAMIN) 1000 MCG tablet Take 1,000 mcg by mouth daily.    No facility-administered encounter medications on file as of 03/30/2021.    Patient Active Problem List   Diagnosis Date Noted   Closed right hip fracture ([place]) 12/07/2019   Pedal edema 08/13/2019   Polyp of colon    Gastrointestinal hemorrhage    Chronic renal failure, stage  3b ([place])    DVT of lower extremity, bilateral ([place]) 08/06/2019   Bleeding hemorrhoids 08/06/2019   DVT, lower extremity, distal, acute, bilateral ([place]) 08/06/2019   Advanced dementia ([place]) 08/02/2019   Abnormal CT of the abdomen    External hemorrhoids    Transient alteration of awareness 07/14/2018   Chronic diarrhea 02/17/2018   History of fracture of radius 02/17/2018   Hypokalemia 02/09/2018   Hypotension 02/09/2018   History of lower GI bleeding 02/09/2018   Acute blood loss anemia 02/09/2018   History of CVA (cerebrovascular accident) 03/10/2016   Carotid aneurysm,  right ([place]) 02/21/2016   Lipoma of shoulder 03/27/2015   History of closed Colles' fracture 09/06/2014   Allergic rhinitis 75/03/2584   Diastolic dysfunction 27/78/2423   Iron deficiency anemia 03/25/2014   History of GI bleed 03/21/2014   BRBPR (bright red blood per rectum) 03/19/2014   Anxiety 09/01/2013   Encounter for Medicare annual wellness exam 01/08/2013   Gallstones 11/11/2011   Arterial ischemic stroke, 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   Risk for falls 06/04/2011   Constipation 03/05/2011   Vitamin D deficiency 03/28/2009   HYPERCHOLESTEROLEMIA, PURE 03/17/2007   Essential hypertension 03/16/2007   OSTEOARTHRITIS 03/16/2007   Osteoporosis 03/16/2007    Conditions to be addressed/monitored:HTN and Dementia  Care Plan : Dementia (Adult)  Updates made by Dannielle Karvonen, RN since 03/30/2021 12:00 AM     Problem: Harm or Injury   Priority: Medium     Long-Range Goal: Harm or Injury Prevented   Start Date: 12/08/2020  Expected End Date: 06/29/2021  This Visit's Progress: On track  Recent Progress: On track  Priority: Medium  Note:   Current Barriers:  HIPAA verified with daughter/ care giver, [name].  Daughter states not sleeping well. She states she started patient on the Quetiapine on 03/12/2021 which is helping some.  Daughter states the medicine has also helped with patient's hallucinations.   Patient continues with palliative care with last visit being on 03/12/2021. Next follow up on 04/04/2021.  Daughter denies patient having any falls.  She reports continued use of chair and bed alarms for patient safety / fall prevention.   RNCM discussed with daughter community resource options .  Daughter given contact phone number for Department of social services to discuss possible Medicaid qualification.  Knowledge Deficits related to basic understanding of dementia and self care management safety Unable to self administer medications as prescribed Unable to perform ADLs  independently Unable to perform IADLs independently Cognitive deficits Clinical Goal(s):  Collaboration with Tower, Wynelle Fanny, MD regarding development and update of comprehensive plan of care as evidenced by provider attestation and co-signature Inter-disciplinary care team collaboration (see longitudinal plan of care) Patient/caregiver will work with care management team to address care coordination and chronic disease management needs related to dementia Dementia and Caregiver Support Level of Care Concerns  Interventions:  Evaluation of current treatment plan related to Dementia, Level of care concerns, self-management and patient's adherence to plan as established by provider. Collaboration with Tower, Wynelle Fanny, MD regarding development and update of comprehensive plan of care as evidenced by provider attestation       and co-signature Inter-disciplinary care team collaboration (see longitudinal plan of care) Discussed plans with patient for ongoing care management follow up and provided patient with direct contact information for care management team Discussed fall precautions and home safety measure for home setting Discussed and reviewed medications. Assessed for falls Provided verbal information regarding dementia and safety precautions  Assessed  caregiver knowledge of Dementia and care to be provided to patient.  Patient Goals: - Continue to use assistive device( walker) for ambulation  - Continue to follow up with your providers as recommended. - Continue to take your medications as prescribed and refill timely - Remove any potentially dangerous tools, utensil, etc from environment,  Remove or secure potential toxins.  - Reduce the risk of falls and slips (remove throw rugs, cords, wear non slip shoes, use ambulating device(walker, wheelchair) as recommended. - Continue to keep areas in home well lite. - Always keep doors that lead outside locked  - Contact your RN case manager  for community resource needs.  Follow Up Plan: The patient has been provided with contact information for the care management team and has been advised to call with any health related questions or concerns.  The care management team will reach out to the patient again over the next 2-3 months.       Care Plan : Hypertension (Adult)  Updates made by Dannielle Karvonen, RN since 03/30/2021 12:00 AM     Problem: Hypertension (Hypertension)   Priority: Medium     Long-Range Goal: Hypertension Monitored   Start Date: 12/08/2020  Expected End Date: 06/29/2021  This Visit's Progress: On track  Recent Progress: On track  Priority: Medium  Note:   Objective: HIPAA verified with caregiver/ daughter, [name].  Daughter states she continues to monitor patients blood pressure 2 times per day.  Recent blood pressures:  164/90, 136/81, 170/84, 119/83, 169/99 163/91.  Daughter expressed understanding of when to call doctor for elevated blood pressure readings.  No new or ongoing symptoms reported.  Last practice recorded BP readings:  BP Readings from Last 3 Encounters:  07/05/20 (!) 142/86  04/26/20 122/70  02/11/20 (!) 179/93  Current Barriers:  Knowledge Deficits related to basic understanding of hypertension pathophysiology and self care management Cognitive Deficits : Patient diagnosed with Advanced Dementia approximately 6-7 years ago.  Unable to self administer medications as prescribed Unable to perform ADLs independently Unable to perform IADLs independently Case Manager Clinical Goal(s):  patient will attend scheduled medical appointments Patient/ caregiver will demonstrate improved adherence to prescribed treatment plan for hypertension as evidenced by taking all medications as prescribed, monitoring and recording blood pressure as directed, adhering to low sodium/DASH diet Patient/caregiver will verbalize basic understanding of hypertension disease process and self health management  plan. Interventions:  Collaboration with Tower, Wynelle Fanny, MD regarding development and update of comprehensive plan of care as evidenced by provider attestation and co-signature Inter-disciplinary care team collaboration (see longitudinal plan of care) Evaluation of current treatment plan related to hypertension self management and patient's adherence to plan as established by provider. Reviewed medications with caregiver and discussed importance of compliance Discussed plans with caregiver for ongoing care management follow up and provided patient/caregiver  with direct contact information for care management team Advised  caregiver to monitor patients  blood pressure daily and record, calling PCP for findings outside established parameters.  Reviewed scheduled/upcoming provider appointments Discussed importance of low salt/ DASH diet Patient Goals: - Continue to check blood pressure daily and  write blood pressure results in a log or diary. Notify provider for blood pressures that are to high or to low.   - Continue to take medications as prescribed and refill medications timely -  Continue to follow up with providers as recommended - Notify provider for any new or ongoing symptoms.  Follow Up Plan: The patient has been provided  with contact information for the care management team and has been advised to call with any health related questions or concerns.  The care management team will reach out to the patient again over the next 2-3 months.           Plan:The patient has been provided with contact information for the care management team and has been advised to call with any health related questions or concerns.  and The care management team will reach out to the patient again over the next 2-3 months. Quinn Plowman RN,BSN,CCM RN Case Manager [place]  [phone]

## 2021-03-30 NOTE — Patient Instructions (Signed)
Visit Information:  Thank you for taking the time to speak with me today.   PATIENT GOALS:  Goals Addressed             This Visit's Progress    Prevent Falls-Dementia   On track    Timeframe:  Long-Range Goal Priority:  High Start Date:   12/08/2020                          Expected End Date: 06/29/2021                 Follow Up Date 06/15/2021   - Continue to use assistive device( walker) for ambulation  - Continue to follow up with your providers as recommended. - Continue to take your medications as prescribed and refill timely - Remove any potentially dangerous tools, utensil, etc from environment,  Remove or secure potential toxins.  - Reduce the risk of falls and slips (remove throw rugs, cords, wear non slip shoes, use ambulating device(walker, wheelchair) as recommended. - Continue to keep areas in home well lite. - Always keep doors that lead outside locked  - Contact your RN case manager for community resource needs.   Why is this important?   There may be trouble with balance and getting around. Falls can happen.         Track and Manage My Blood Pressure-Hypertension   On track    Timeframe:  Long-Range Goal Priority:  Medium Start Date:  12/08/2020                           Expected End Date: 06/29/2021                Follow Up Date 06/15/2021  - Continue to check blood pressure daily and  write blood pressure results in a log or diary. Notify provider for blood pressures that are to high or to low.   - Continue to take medications as prescribed and refill medications timely -  Continue to follow up with providers as recommended - Notify provider for any new or ongoing symptoms.    Why is this important?   You won't feel high blood pressure, but it can still hurt your blood vessels.  High blood pressure can cause heart or kidney problems. It can also cause a stroke.  Making lifestyle changes like losing a little weight or eating less salt will help.   Checking your blood pressure at home and at different times of the day can help to control blood pressure.  If the doctor prescribes medicine remember to take it the way the doctor ordered.  Call the office if you cannot afford the medicine or if there are questions about it.             Patient verbalizes understanding of instructions provided today and agrees to view in [place].   The patient has been provided with contact information for the care management team and has been advised to call with any health related questions or concerns.  The care management team will reach out to the patient again over the next 2-3 months.   Quinn Plowman RN,BSN,CCM RN Case Manager [place]  [phone]

## 2021-04-05 ENCOUNTER — Other Ambulatory Visit: Payer: Self-pay

## 2021-04-05 ENCOUNTER — Other Ambulatory Visit: Payer: Medicare Other | Admitting: Hospice

## 2021-04-05 DIAGNOSIS — F03918 Unspecified dementia, unspecified severity, with other behavioral disturbance: Secondary | ICD-10-CM

## 2021-04-05 DIAGNOSIS — K5901 Slow transit constipation: Secondary | ICD-10-CM | POA: Diagnosis not present

## 2021-04-05 DIAGNOSIS — R451 Restlessness and agitation: Secondary | ICD-10-CM

## 2021-04-05 DIAGNOSIS — Z515 Encounter for palliative care: Secondary | ICD-10-CM

## 2021-04-05 NOTE — Progress Notes (Signed)
[facility] Consult Note Telephone: [phone]  Fax: [phone]  PATIENT NAME: Wendy Krueger DOB: 24-Feb-1930 MRN: [id number]  PRIMARY CARE PROVIDER:   Abner Greenspan, MD [place], Wynelle Fanny, MD [place],  [place] [address]  REFERRING PROVIDER: Abner Greenspan, MD Abner Greenspan, MD [place],  [place] [address]  RESPONSIBLE PARTY:  Melinda Crutch - son lives close by [phone]     Name [place] Daughter [phone]  [phone]   Alexander Bergeron Daughter [phone]    [place] Daughter [phone]         Visit is to build trust and highlight Palliative Medicine as specialized medical care for people living with serious illness, aimed at facilitating better quality of life through symptoms relief, assisting with advance care planning and complex medical decision making. Chryl Heck NP shadowing. Jolayne Haines is at home with patient during visit. This is a follow up visit.  RECOMMENDATIONS/PLAN:   Advance Care Planning/Code Status: Patient is a DO NOT RESUSCITATE   Goals of Care: Goals include to maximize quality of life and symptom management.  MOST selections include limited additional intervention, IV fluids if indicated, antibiotics if indicated, no feeding tube.  Family is interested in hospice service in the future when patient qualifies for it.  Visit consisted of counseling and education dealing with the complex and emotionally intense issues of symptom management and palliative care in the setting of serious and potentially life-threatening illness. Palliative care team will continue to support patient, patient's family, and medical team.  Symptom management/Plan:  Dementia: Ongoing memory loss/confusion in line with Dementia disease trajectory. Education on what to expect; redirection encouraged. Continue ongoing  supportive care.  Agitation: Managed with Seroquel. Deescalation techniques discussed. Fall and safety precautions discussed Constipation managed with Fibre One and Colace. Follow up: Palliative care will continue to follow for complex medical decision making, advance care planning, and clarification of goals. Return 6 weeks or prn. Encouraged to call provider sooner with any concerns.  CHIEF COMPLAINT: Palliative follow up  HISTORY OF PRESENT ILLNESS:  Wendy Krueger a 87 y.o. female with multiple medical problems including agitation which is now better managed with Seroquel. History of dementia, hypertension, closed right hip fracture following a fall, insomnia.  Patient denies pain/discomfort, no respiratory distress.  No report of fall or hospitalization since last visit.  Bobbie with no concerns today history obtained from review of EMR, discussion with primary team, family and/or patient. Records reviewed and summarized above. All 10 point systems reviewed and are negative except as documented in history of present illness above  Review and summarization of Epic records shows history from other than patient.   Palliative Care was asked to follow this patient o help address complex decision making in the context of advance care planning and goals of care clarification.   PHYSICAL EXAM  General: In no acute distress, appropriately dressed Cardiovascular: regular rate and rhythm; no edema in BLE Pulmonary: no cough, no increased work of breathing, normal respiratory effort Abdomen: soft, non tender, no guarding, positive bowel sounds in all quadrants GU:  no suprapubic tenderness Eyes: Normal lids, no discharge ENMT: Moist mucous membranes Musculoskeletal:  weakness Skin: no rash to visible skin, warm without cyanosis,  Psych: non-anxious affect Neurological: Weakness but otherwise non focal, memory loss/confusion Heme/lymph/immuno: no bruises, no bleeding  PERTINENT MEDICATIONS:   Outpatient Encounter Medications as  of 04/05/2021  Medication Sig   acetaminophen (TYLENOL) 500 MG tablet Take 500 mg by mouth every 6 (six) hours as needed.   Ascorbic Acid (VITAMIN C) 1000 MG tablet Take 1,000 mg by mouth daily.   Chlorpheniramine Maleate (ALLERGY PO) Take 1 tablet by mouth daily.    donepezil (ARICEPT) 10 MG tablet TAKE 1 TABLET BY MOUTH AT BEDTIME NEEDS APPT FOR REFILLS   hydrochlorothiazide (HYDRODIURIL) 25 MG tablet TAKE 1 TABLET BY MOUTH EVERY DAY FOR BLOOD PRESSURE   iron polysaccharides (NIFEREX) 150 MG capsule TAKE 1 CAPSULE (150 MG TOTAL) BY MOUTH EVERY OTHER DAY.   KLOR-CON M10 10 MEQ tablet TAKE 2 TABLETS BY MOUTH DAILY   losartan (COZAAR) 100 MG tablet TAKE 1 TABLET BY MOUTH EVERY DAY   Melatonin 10 MG TABS Take 10 mg by mouth at bedtime.   memantine (NAMENDA) 10 MG tablet TAKE 1 TABLET BY MOUTH TWICE A DAY   metoprolol tartrate (LOPRESSOR) 25 MG tablet TAKE 1 TABLET BY MOUTH TWICE A DAY   Multiple Vitamins-Minerals ([employer] SPECTRAVITE PO) Take 1 capsule by mouth daily.   polyethylene glycol (MIRALAX / GLYCOLAX) 17 g packet Take 17 g by mouth daily. (Patient taking differently: Take 17 g by mouth daily as needed.)   Probiotic Product (PROBIOTIC PO) Take 1 tablet by mouth every other day.    QUEtiapine (SEROQUEL) 25 MG tablet Give 1/2 to 1 pill by mouth as needed in the evening for agitation/ hallucinations   vitamin B-12 (CYANOCOBALAMIN) 1000 MCG tablet Take 1,000 mcg by mouth daily.   No facility-administered encounter medications on file as of 04/05/2021.    HOSPICE ELIGIBILITY/DIAGNOSIS: TBD  PAST MEDICAL HISTORY:  Past Medical History:  Diagnosis Date   Asymptomatic gallstones    BRBPR (bright red blood per rectum)    Cataract 04/13/2018   bilateral eyes   Cholelithiasis    Clot    RLE blood clots; s/p IVC filter   Colitis - presumed infectious origin    one ER visit    Dementia ([place])    Duodenitis    Fall    Gastritis    GI bleed    Hemorrhoid     bleeding   Hiatal hernia    Hyperlipidemia    Hypertension    Osteoarthritis    Osteoporosis    Stroke ([place])    Syncope      ALLERGIES:  Allergies  Allergen Reactions   Alendronate Sodium Other (See Comments)    Leg pain    Amlodipine Besylate Hives   Calcitonin ([place]) Other (See Comments)     headache/ head pressure   Plavix [Clopidogrel Bisulfate] Other (See Comments)    drowsy     Simvastatin Other (See Comments)    Leg pain       I spent 60 minutes providing this consultation; this includes time spent with patient/family, chart review and documentation. More than 50% of the time in this consultation was spent on counseling and coordinating communication   Thank you for the opportunity to participate in the care of CHIQUITTA MATTY Please call our office at [phone] if we can be of additional assistance.  Note: Portions of this note were generated with Lobbyist. Dictation errors may occur despite best attempts at proofreading.  Teodoro Spray, NP

## 2021-04-24 ENCOUNTER — Other Ambulatory Visit: Payer: Self-pay | Admitting: Family Medicine

## 2021-04-26 NOTE — Progress Notes (Deleted)
Subjective:   Wendy Krueger is a 87 y.o. female who presents for Medicare Annual (Subsequent) preventive examination.  I connected with *** today by telephone and verified that I am speaking with the correct person using two identifiers. Location patient: home Location provider: work Persons participating in the virtual visit: patient, Marine scientist.    I discussed the limitations, risks, security and privacy concerns of performing an evaluation and management service by telephone and the availability of in person appointments. I also discussed with the patient that there may be a patient responsible charge related to this service. The patient expressed understanding and verbally consented to this telephonic visit.    Interactive audio and video telecommunications were attempted between this provider and patient, however failed, due to patient having technical difficulties OR patient did not have access to video capability.  We continued and completed visit with audio only.  Some vital signs may be absent or patient reported.   Time Spent with patient on telephone encounter: *** minutes  Review of Systems           Objective:    There were no vitals filed for this visit. There is no height or weight on file to calculate BMI.  Advanced Directives 12/08/2020 04/26/2020 12/08/2019 12/07/2019 12/07/2019 08/07/2019 08/06/2019  Does Patient Have a Medical Advance Directive? Yes Yes Yes Yes Yes Yes Yes  Type of Paramedic of [place];Living will;Out of facility DNR (pink MOST or yellow form) [place];Living will Living will Living will Living will;Healthcare Power of [place] will Living will  Does patient want to make changes to medical advance directive? No - Patient declined - No - Patient declined - - No - Patient declined -  Copy of [place] in Chart? - Yes - validated most recent copy scanned in chart (See row information) Yes -  validated most recent copy scanned in chart (See row information) - Yes - validated most recent copy scanned in chart (See row information) - -  Would patient like information on creating a medical advance directive? - - - - - - -  Pre-existing out of facility DNR order (yellow form or pink MOST form) - - - - - - -    Current Medications (verified) Outpatient Encounter Medications as of 04/27/2021  Medication Sig   acetaminophen (TYLENOL) 500 MG tablet Take 500 mg by mouth every 6 (six) hours as needed.   Ascorbic Acid (VITAMIN C) 1000 MG tablet Take 1,000 mg by mouth daily.   Chlorpheniramine Maleate (ALLERGY PO) Take 1 tablet by mouth daily.    donepezil (ARICEPT) 10 MG tablet TAKE 1 TABLET BY MOUTH AT BEDTIME NEEDS APPT FOR REFILLS   hydrochlorothiazide (HYDRODIURIL) 25 MG tablet TAKE 1 TABLET BY MOUTH EVERY DAY FOR BLOOD PRESSURE   iron polysaccharides (NIFEREX) 150 MG capsule TAKE 1 CAPSULE (150 MG TOTAL) BY MOUTH EVERY OTHER DAY.   KLOR-CON M10 10 MEQ tablet TAKE 2 TABLETS BY MOUTH DAILY   losartan (COZAAR) 100 MG tablet TAKE 1 TABLET BY MOUTH EVERY DAY   Melatonin 10 MG TABS Take 10 mg by mouth at bedtime.   memantine (NAMENDA) 10 MG tablet TAKE 1 TABLET BY MOUTH TWICE A DAY   metoprolol tartrate (LOPRESSOR) 25 MG tablet TAKE 1 TABLET BY MOUTH TWICE A DAY   Multiple Vitamins-Minerals (CVS SPECTRAVITE PO) Take 1 capsule by mouth daily.   polyethylene glycol (MIRALAX / GLYCOLAX) 17 g packet Take 17 g by  mouth daily. (Patient taking differently: Take 17 g by mouth daily as needed.)   Probiotic Product (PROBIOTIC PO) Take 1 tablet by mouth every other day.    QUEtiapine (SEROQUEL) 25 MG tablet Give 1/2 to 1 pill by mouth as needed in the evening for agitation/ hallucinations   vitamin B-12 (CYANOCOBALAMIN) 1000 MCG tablet Take 1,000 mcg by mouth daily.   No facility-administered encounter medications on file as of 04/27/2021.    Allergies (verified) Alendronate sodium, Amlodipine  besylate, Calcitonin ([place]), Plavix [clopidogrel bisulfate], and Simvastatin   History: Past Medical History:  Diagnosis Date   Asymptomatic gallstones    BRBPR (bright red blood per rectum)    Cataract 04/13/2018   bilateral eyes   Cholelithiasis    Clot    RLE blood clots; s/p IVC filter   Colitis - presumed infectious origin    one ER visit    Dementia ([place])    Duodenitis    Fall    Gastritis    GI bleed    Hemorrhoid    bleeding   Hiatal hernia    Hyperlipidemia    Hypertension    Osteoarthritis    Osteoporosis    Stroke ([place])    Syncope    Past Surgical History:  Procedure Laterality Date   ABDOMINAL HYSTERECTOMY     BSO- fibroids   ABI's     normal   ANTERIOR APPROACH HEMI HIP ARTHROPLASTY Right 12/08/2019   Procedure: ANTERIOR APPROACH HEMI HIP ARTHROPLASTY;  Surgeon: Rod Can, MD;  Location: [facility];  Service: Orthopedics;  Laterality: Right;   COLONOSCOPY  08/09/2019   Procedure: COLONOSCOPY;  Surgeon: Lucilla Lame, MD;  [facility];  Service: Endoscopy;;   dexa  07/2003   osteoporosis   ESOPHAGOGASTRODUODENOSCOPY N/A 03/21/2014   Procedure: ESOPHAGOGASTRODUODENOSCOPY (EGD);  Surgeon: Milus Banister, MD;  Location: [place] [facility];  Service: Endoscopy;  Laterality: N/A;   FLEXIBLE SIGMOIDOSCOPY N/A 08/08/2019   Procedure: FLEXIBLE SIGMOIDOSCOPY;  Surgeon: Jonathon Bellows, MD;  Location: [facility] ENDOSCOPY;  Service: Gastroenterology;  Laterality: N/A;   FLEXIBLE SIGMOIDOSCOPY N/A 08/09/2019   Procedure: FLEXIBLE SIGMOIDOSCOPY;  Surgeon: Lucilla Lame, MD;  Location: [facility] ENDOSCOPY;  Service: Endoscopy;  Laterality: N/A;   HIP FRACTURE SURGERY     IR IVC FILTER PLMT / S&I /IMG GUID/MOD SED  07/06/2019   IVC FILTER INSERTION  07/2019   left foot brace     WRIST FRACTURE SURGERY  08/1999   R arm   WRIST FRACTURE SURGERY Left 02/12/2018   Family History  Problem Relation Age of Onset   Heart attack Father    Hypertension Father    Heart attack  Mother    Throat cancer Brother    Breast cancer Daughter    Social History   Socioeconomic History   Marital status: Widowed    Spouse name: Not on file   Number of children: 5   Years of education: 30   Highest education level: Not on file  Occupational History   Occupation: retired    Fish farm manager: RETIRED  Tobacco Use   Smoking status: Never   Smokeless tobacco: Never  Vaping Use   Vaping Use: Never used  Substance and Sexual Activity   Alcohol use: No    Alcohol/week: 0.0 standard drinks   Drug use: No   Sexual activity: Never  Other Topics Concern   Not on file  Social History Narrative   Has 4 brothers, 1 sister   Lives with 1 adult grandchild and  her daughter [name]   Has 4 daughters, 1 son   Right handed   Rare caffeine    Social Determinants of Health   Financial Resource Strain: Low Risk    Difficulty of Paying Living Expenses: Not very hard  Food Insecurity: No Food Insecurity   Worried About Charity fundraiser in the Last Year: Never true   Ran Out of Food in the Last Year: Never true  Transportation Needs: No Transportation Needs   Lack of Transportation (Medical): No   Lack of Transportation (Non-Medical): No  Physical Activity: Inactive   Days of Exercise per Week: 0 days   Minutes of Exercise per Session: 0 min  Stress: No Stress Concern Present   Feeling of Stress : Not at all  Social Connections: Not on file    Tobacco Counseling Counseling given: Not Answered   Clinical Intake:                 Diabetic? No         Activities of Daily Living In your present state of health, do you have any difficulty performing the following activities: 12/08/2020 04/26/2020  Hearing? N N  Vision? N N  Comment bilateral cataracts.  Doesn't wear glasses all the time -  Difficulty concentrating or making decisions? Y Y  Comment advanced dementia has dementia  Walking or climbing stairs? Y N  Dressing or bathing? Y N  Doing errands,  shopping? Y N  Preparing Food and eating ? Y N  Using the Toilet? Y N  In the past six months, have you accidently leaked urine? N Y  Comment - wears depends  Do you have problems with loss of bowel control? N Y  Comment - wears depends  Managing your Medications? Y N  Managing your Finances? Y N  Housekeeping or managing your Housekeeping? Y N  Some recent data might be hidden    Patient Care Team: [place], Wynelle Fanny, MD as PCP - General Shirl Harris, OD as Referring Physician (Optometry) Dannielle Karvonen, RN as Case Manager  Indicate any recent Medical Services you may have received from other than [facility] providers in the past year (date may be approximate).     Assessment:   This is a routine wellness examination for [place].  Hearing/Vision screen No results found.  Dietary issues and exercise activities discussed:     Goals Addressed   None    Depression Screen PHQ 2/9 Scores 04/26/2020 07/14/2019 04/26/2019 04/15/2019 04/13/2018 04/09/2017 02/21/2017  PHQ - 2 Score 0 0 0 0 0 0 2  PHQ- 9 Score 0 - - 0 0 0 10    Fall Risk Fall Risk  01/11/2021 12/08/2020 04/26/2020 07/14/2019 04/26/2019  Falls in the past year? 1 0 1 1 1   Comment - - tripped and fell - -  Number falls in past yr: 0 0 1 1 0  Comment - - - - -  Injury with Fall? 1 - 1 1 0  Comment broken ribs - broke hip Wrist and arm pain requiring and ED visit to evaluate, previous nondisplaced humeral fx.in 2019 -  Risk Factor Category  - - - - -  Risk for fall due to : Mental status change - Medication side effect;Impaired balance/gait;History of fall(s) History of fall(s);Impaired balance/gait;Mental status change;Medication side effect Impaired balance/gait;History of fall(s);Impaired mobility  Risk for fall due to: Comment dementia - - - -  Follow up Falls prevention discussed - Falls evaluation completed;Falls  prevention discussed Falls evaluation completed Falls evaluation completed;Falls prevention  discussed;Education provided    FALL RISK PREVENTION PERTAINING TO THE HOME:  Any stairs in or around the home? {YES/NO:21197} If so, are there any without handrails? {YES/NO:21197} Home free of loose throw rugs in walkways, pet beds, electrical cords, etc? {YES/NO:21197} Adequate lighting in your home to reduce risk of falls? {YES/NO:21197}  ASSISTIVE DEVICES UTILIZED TO PREVENT FALLS:  Life alert? {YES/NO:21197} Use of a cane, walker or w/c? {YES/NO:21197} Grab bars in the bathroom? {YES/NO:21197} Shower chair or bench in shower? {YES/NO:21197} Elevated toilet seat or a handicapped toilet? {YES/NO:21197}  TIMED UP AND GO:  Was the test performed? No .    Cognitive Function: MMSE - Mini Mental State Exam 04/26/2020 04/15/2019 04/13/2018 04/09/2017 12/05/2016  Not completed: Unable to complete Unable to complete Unable to complete (No Data) -  Orientation to time - - - - -  Orientation to Place - - - - -  Registration - - - - -  Attention/ Calculation - - - - -  Recall - - - - -  Language- name 2 objects - - - - -  Language- repeat - - - - -  Language- follow 3 step command - - - - -  Language- read & follow direction - - - - -  Write a sentence - - - - 1  Copy design - - - - 1  Total score - - - - -        Immunizations Immunization History  Administered Date(s) Administered   Influenza Split 06/04/2011, 07/10/2012   Influenza,inj,Quad PF,6+ Mos 07/12/2014, 08/09/2016, 04/09/2017, 04/17/2018, 04/19/2019, 04/26/2020   Pneumococcal Conjugate-13 01/11/2014   Pneumococcal Polysaccharide-23 06/04/2011   Td 08/27/2004    {TDAP status:2101805}  {Flu Vaccine status:2101806}  Pneumococcal vaccine status: Up to date  {Covid-19 vaccine status:2101808}  Qualifies for Shingles Vaccine? {YES/NO:21197}  Zostavax completed {YES/NO:21197}  {Shingrix Completed?:2101804}  Screening Tests Health Maintenance  Topic Date Due   Zoster Vaccines- Shingrix (1 of 2) Never done    INFLUENZA VACCINE  01/29/2021   MAMMOGRAM  04/26/2024 (Originally 11/18/2018)   COVID-19 Vaccine (1) 05/12/2024 (Originally 04/24/1930)   TETANUS/TDAP  08/26/2024 (Originally 08/27/2014)   Pneumonia Vaccine 14+ Years old  Completed   DEXA SCAN  Completed   HPV VACCINES  Aged Out    Health Maintenance  Health Maintenance Due  Topic Date Due   Zoster Vaccines- Shingrix (1 of 2) Never done   INFLUENZA VACCINE  01/29/2021    Colorectal cancer screening: No longer required.   {Mammogram status:21018020}  Bone Density status: No longer required  Lung Cancer Screening: (Low Dose CT Chest recommended if Age 28-80 years, 30 pack-year currently smoking OR have quit w/in 15years.) does not qualify.     Additional Screening:  Hepatitis C Screening: does qualify; Completed ***  Vision Screening: Recommended annual ophthalmology exams for early detection of glaucoma and other disorders of the eye. Is the patient up to date with their annual eye exam?  {YES/NO:21197} Who is the provider or what is the name of the office in which the patient attends annual eye exams? *** If pt is not established with a provider, would they like to be referred to a provider to establish care? {YES/NO:21197}.   Dental Screening: Recommended annual dental exams for proper oral hygiene  Community Resource Referral / Chronic Care Management: CRR required this visit?  {YES/NO:21197}  CCM required this visit?  {YES/NO:21197}     Plan:  I have personally reviewed and noted the following in the patient's chart:   Medical and social history Use of alcohol, tobacco or illicit drugs  Current medications and supplements including opioid prescriptions.  Functional ability and status Nutritional status Physical activity Advanced directives List of other physicians Hospitalizations, surgeries, and ER visits in previous 12 months Vitals Screenings to include cognitive, depression, and falls Referrals  and appointments  In addition, I have reviewed and discussed with patient certain preventive protocols, quality metrics, and best practice recommendations. A written personalized care plan for preventive services as well as general preventive health recommendations were provided to patient.     Loma Messing, LPN   [id number]/63/[id number]   Nurse Health Advisor  Nurse Notes: ***

## 2021-04-27 ENCOUNTER — Telehealth: Payer: Self-pay

## 2021-04-27 ENCOUNTER — Ambulatory Visit: Payer: Medicare Other

## 2021-04-27 NOTE — Telephone Encounter (Signed)
Left 2 VM on daughter [name]) number in regards to medicare annual wellness telephone visit today @ 9:00am. Advised that she can reschedule the appointment when available.TM

## 2021-04-29 ENCOUNTER — Other Ambulatory Visit: Payer: Self-pay | Admitting: Family Medicine

## 2021-04-30 NOTE — Telephone Encounter (Signed)
Last OV with PCP was on 04/26/20, pt did have a palliative home visit on 04/05/21 but no recent or future appts with PCP, please advise

## 2021-05-18 ENCOUNTER — Ambulatory Visit (INDEPENDENT_AMBULATORY_CARE_PROVIDER_SITE_OTHER): Payer: Medicare Other

## 2021-05-18 DIAGNOSIS — I10 Essential (primary) hypertension: Secondary | ICD-10-CM

## 2021-05-18 DIAGNOSIS — Z9181 History of falling: Secondary | ICD-10-CM

## 2021-05-18 DIAGNOSIS — F03C0 Unspecified dementia, severe, without behavioral disturbance, psychotic disturbance, mood disturbance, and anxiety: Secondary | ICD-10-CM

## 2021-05-18 NOTE — Chronic Care Management (AMB) (Signed)
Chronic Care Management   CCM RN Visit Note  05/18/2021 Name: Wendy Krueger MRN: [id number] DOB: [birth year]  Subjective: Wendy Krueger is a 87 y.o. year old female who is a primary care patient of Tower, Wynelle Fanny, MD. The care management team was consulted for assistance with disease management and care coordination needs.    Engaged with patient by telephone for follow up visit in response to provider referral for case management and/or care coordination services.   Consent to Services:  The patient was given information about Chronic Care Management services, agreed to services, and gave verbal consent prior to initiation of services.  Please see initial visit note for detailed documentation.   Patient agreed to services and verbal consent obtained.   Assessment: Review of patient past medical history, allergies, medications, health status, including review of consultants reports, laboratory and other test data, was performed as part of comprehensive evaluation and provision of chronic care management services.   SDOH (Social Determinants of Health) assessments and interventions performed:    CCM Care Plan  Allergies  Allergen Reactions   Alendronate Sodium Other (See Comments)    Leg pain    Amlodipine Besylate Hives   Calcitonin ([place]) Other (See Comments)     headache/ head pressure   Plavix [Clopidogrel Bisulfate] Other (See Comments)    drowsy     Simvastatin Other (See Comments)    Leg pain     Outpatient Encounter Medications as of 05/18/2021  Medication Sig Note   acetaminophen (TYLENOL) 500 MG tablet Take 500 mg by mouth every 6 (six) hours as needed.    Ascorbic Acid (VITAMIN C) 1000 MG tablet Take 1,000 mg by mouth daily.    Chlorpheniramine Maleate (ALLERGY PO) Take 1 tablet by mouth daily.     donepezil (ARICEPT) 10 MG tablet TAKE 1 TABLET BY MOUTH AT BEDTIME NEEDS APPT FOR REFILLS    hydrochlorothiazide (HYDRODIURIL) 25 MG tablet TAKE 1 TABLET BY  MOUTH EVERY DAY FOR BLOOD PRESSURE    iron polysaccharides (NIFEREX) 150 MG capsule TAKE 1 CAPSULE (150 MG TOTAL) BY MOUTH EVERY OTHER DAY. 12/08/2020: Daughter states patient takes on ly on Tuesdays and Thursdays.   KLOR-CON M10 10 MEQ tablet TAKE 2 TABLETS BY MOUTH DAILY    losartan (COZAAR) 100 MG tablet TAKE 1 TABLET BY MOUTH EVERY DAY    Melatonin 10 MG TABS Take 10 mg by mouth at bedtime.    memantine (NAMENDA) 10 MG tablet TAKE 1 TABLET BY MOUTH TWICE A DAY    metoprolol tartrate (LOPRESSOR) 25 MG tablet TAKE 1 TABLET BY MOUTH TWICE A DAY    Multiple Vitamins-Minerals ([employer] SPECTRAVITE PO) Take 1 capsule by mouth daily.    polyethylene glycol (MIRALAX / GLYCOLAX) 17 g packet Take 17 g by mouth daily. (Patient taking differently: Take 17 g by mouth daily as needed.)    Probiotic Product (PROBIOTIC PO) Take 1 tablet by mouth every other day.     QUEtiapine (SEROQUEL) 25 MG tablet TAKE 1/2-1 TABLET BY MOUTH AS NEEDED IN THE EVENING FOR AGITATION/HALLUCINATIONS    vitamin B-12 (CYANOCOBALAMIN) 1000 MCG tablet Take 1,000 mcg by mouth daily.    No facility-administered encounter medications on file as of 05/18/2021.    Patient Active Problem List   Diagnosis Date Noted   Closed right hip fracture ([place]) 12/07/2019   Pedal edema 08/13/2019   Polyp of colon    Gastrointestinal hemorrhage    Chronic renal failure, stage 3b ([place])  DVT of lower extremity, bilateral ([place]) 08/06/2019   Bleeding hemorrhoids 08/06/2019   DVT, lower extremity, distal, acute, bilateral ([place]) 08/06/2019   Advanced dementia 08/02/2019   Abnormal CT of the abdomen    External hemorrhoids    Transient alteration of awareness 07/14/2018   Chronic diarrhea 02/17/2018   History of fracture of radius 02/17/2018   Hypokalemia 02/09/2018   Hypotension 02/09/2018   History of lower GI bleeding 02/09/2018   Acute blood loss anemia 02/09/2018   History of CVA (cerebrovascular accident) 03/10/2016   Carotid aneurysm,  right ([place]) 02/21/2016   Lipoma of shoulder 03/27/2015   History of closed Colles' fracture 09/06/2014   Allergic rhinitis 83/66/2947   Diastolic dysfunction 65/46/5035   Iron deficiency anemia 03/25/2014   History of GI bleed 03/21/2014   BRBPR (bright red blood per rectum) 03/19/2014   Anxiety 09/01/2013   Encounter for Medicare annual wellness exam 01/08/2013   Gallstones 11/11/2011   Arterial ischemic stroke, 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   Risk for falls 06/04/2011   Constipation 03/05/2011   Vitamin D deficiency 03/28/2009   HYPERCHOLESTEROLEMIA, PURE 03/17/2007   Essential hypertension 03/16/2007   OSTEOARTHRITIS 03/16/2007   Osteoporosis 03/16/2007    Conditions to be addressed/monitored:HTN, Dementia, and Falls   Care Plan : [facility] Plan of care  Updates made by Dannielle Karvonen, RN since 05/18/2021 12:00 AM     Problem: Chronic disease management, education and / or care coordination needs.   Priority: High     Long-Range Goal: Development of plan of care to address chronic disease management and / or care coordination needs.   Start Date: 05/18/2021  Expected End Date: 08/28/2021  Priority: High  Note:   Current Barriers:  Knowledge Deficits related to plan of care for management of HTN and Dementia  Chronic Disease Management support and education needs related to HTN and Dementia Cognitive Deficits:  Dementia Call to daughter, Wendy Krueger. HIPAA verified by daughter for patient.   Daughter states patient is doing well. She states Patient is sleeping better  and agitation/ hallucinations have improved.   Daughter reports patients recent blood pressure readings:  104/95, 141/89, 156/81.  Daughter states patient is adhering to low salt diet.  Last visit from palliative care 04/05/2021.  Daughter denies patient having any fall since last outreach with RNCM.  RNCM Clinical Goal(s):  Patient will verbalize understanding of plan for management of HTN and Dementia take all  medications exactly as prescribed and will call provider for medication related questions attend all scheduled medical appointments:  continue to work with RN Care Manager to address care management and care coordination needs related to  HTN and Dementia through collaboration with RN Care manager, provider, and care team.   Interventions: 1:1 collaboration with primary care provider regarding development and update of comprehensive plan of care as evidenced by provider attestation and co-signature Inter-disciplinary care team collaboration (see longitudinal plan of care) Evaluation of current treatment plan related to  self management and patient's adherence to plan as established by provider  Hypertension Interventions: Goal on track: Yes Last practice recorded BP readings:  BP Readings from Last 3 Encounters:  07/05/20 (!) 142/86  04/26/20 122/70  02/11/20 (!) 179/93  Most recent eGFR/CrCl: No results found for: EGFR  No components found for: CRCL  Evaluation of current treatment plan related to hypertension self management and patient's adherence to plan as established by provider; Reviewed medications with patient and discussed importance of compliance; Discussed plans with patient for ongoing  care management follow up and provided patient with direct contact information for care management team; Reviewed scheduled/upcoming provider appointments including:  Advised caregiver to continue monitoring patients blood pressure daily and record, calling PCP for findings outside established parameters. Discussed importance of low salt/ DASH diet  Dementia / Falls interventions: Goal on track:  Yes Evaluation of current treatment plan related to HTN and Dementia,  self-management and patient's adherence to plan as established by provider. Discussed plans with patient for ongoing care management follow up and provided patient with direct contact information for care management team Evaluation  of current treatment plan related to Dementia and patient's adherence to plan as established by provider; Reviewed medications with patient and discussed  ; Reviewed scheduled/upcoming provider appointments including; Discussed plans with patient for ongoing care management follow up and provided patient with direct contact information for care management team;  Discussed dementia and safety precautions Discussed fall precautions / home safety  Assessed for falls  Patient Goals/Self-Care Activities: Patient will self administer medications as prescribed as evidenced by self report/primary caregiver report  Patient will attend all scheduled provider appointments as evidenced by clinician review of documented attendance to scheduled appointments and patient/caregiver report Patient will call pharmacy for medication refills as evidenced by patient report and review of pharmacy fill history as appropriate Patient will call provider office for new concerns or questions as evidenced by review of documented incoming telephone call notes and patient report Continue to check blood pressure daily and record. Continue to use assistive device ( walker) for ambulation Follow fall safety precautions: remove throw rugs, cords, wear non slip shoes, use ambulating device, and keep doors that lead outside locked.         Plan:The patient has been provided with contact information for the care management team and has been advised to call with any health related questions or concerns.  The care management team will reach out to the patient again over the next 2-3 months . Quinn Plowman RN,BSN,CCM RN Case Manager [place]  [phone]

## 2021-05-18 NOTE — Patient Instructions (Signed)
Visit Information  Thank you for taking time to visit with me today. Please don't hesitate to contact me if I can be of assistance to you before our next scheduled telephone appointment.  The patient has been provided with contact information for the care management team and has been advised to call with any health related questions or concerns.  The care management team will reach out to the patient again on August 10, 2021 at 11:30 am.    If you need to cancel or re-schedule our visit, please call [phone] and our care guide team will be happy to assist you.  Patient Goals/Self-Care Activities: Patient will self administer medications as prescribed as evidenced by self report/primary caregiver report  Patient will attend all scheduled provider appointments as evidenced by clinician review of documented attendance to scheduled appointments and patient/caregiver report Patient will call pharmacy for medication refills as evidenced by patient report and review of pharmacy fill history as appropriate Patient will call provider office for new concerns or questions as evidenced by review of documented incoming telephone call notes and patient report Continue to check blood pressure daily and record. Continue to use assistive device ( walker) for ambulation Follow fall safety precautions: remove throw rugs, cords, wear non slip shoes, use ambulating device, and keep doors that lead outside locked.   Patient verbalizes understanding of instructions provided today and agrees to view in [place].  Quinn Plowman RN,BSN,CCM RN Case Manager [place]  [phone]

## 2021-05-20 NOTE — Progress Notes (Signed)
Subjective:   LETASHA KERSHAW is a 87 y.o. female who presents for Medicare Annual (Subsequent) preventive examination.  I connected with Khadija Thier daughter [name] today by telephone and verified that I am speaking with the correct person using two identifiers. Location patient: home Location provider: work Persons participating in the virtual visit: patient daughter, Marine scientist.    I discussed the limitations, risks, security and privacy concerns of performing an evaluation and management service by telephone and the availability of in person appointments. I also discussed with the patient that there may be a patient responsible charge related to this service. The patient expressed understanding and verbally consented to this telephonic visit.    Interactive audio and video telecommunications were attempted between this provider and patient, however failed, due to patient having technical difficulties OR patient did not have access to video capability.  We continued and completed visit with audio only.  Some vital signs may be absent or patient reported.   Time Spent with patient on telephone encounter: 30 minutes  Review of Systems     Cardiac Risk Factors include: advanced age (>14men, >54 women);hypertension;dyslipidemia;Other (see comment), Risk factor comments: Dementia     Objective:    Today's Vitals   05/21/21 1527  Weight: 150 lb (68 kg)  Height: 5' (1.524 m)   Body mass index is 29.29 kg/m.  Advanced Directives 05/21/2021 12/08/2020 04/26/2020 12/08/2019 12/07/2019 12/07/2019 08/07/2019  Does Patient Have a Medical Advance Directive? Yes Yes Yes Yes Yes Yes Yes  Type of Paramedic of [place];Living will [place];Living will;Out of facility DNR (pink MOST or yellow form) [place];Living will Living will Living will Living will;Healthcare Power of [place] will  Does patient want to make changes to  medical advance directive? Yes (MAU/Ambulatory/Procedural Areas - Information given) No - Patient declined - No - Patient declined - - No - Patient declined  Copy of [place] in Chart? Yes - validated most recent copy scanned in chart (See row information) - Yes - validated most recent copy scanned in chart (See row information) Yes - validated most recent copy scanned in chart (See row information) - Yes - validated most recent copy scanned in chart (See row information) -  Would patient like information on creating a medical advance directive? - - - - - - -  Pre-existing out of facility DNR order (yellow form or pink MOST form) - - - - - - -    Current Medications (verified) Outpatient Encounter Medications as of 05/21/2021  Medication Sig   acetaminophen (TYLENOL) 500 MG tablet Take 500 mg by mouth every 6 (six) hours as needed.   Ascorbic Acid (VITAMIN C) 1000 MG tablet Take 1,000 mg by mouth daily.   Chlorpheniramine Maleate (ALLERGY PO) Take 1 tablet by mouth daily.    donepezil (ARICEPT) 10 MG tablet TAKE 1 TABLET BY MOUTH AT BEDTIME NEEDS APPT FOR REFILLS   hydrochlorothiazide (HYDRODIURIL) 25 MG tablet TAKE 1 TABLET BY MOUTH EVERY DAY FOR BLOOD PRESSURE   iron polysaccharides (NIFEREX) 150 MG capsule TAKE 1 CAPSULE (150 MG TOTAL) BY MOUTH EVERY OTHER DAY.   KLOR-CON M10 10 MEQ tablet TAKE 2 TABLETS BY MOUTH DAILY   losartan (COZAAR) 100 MG tablet TAKE 1 TABLET BY MOUTH EVERY DAY   Melatonin 10 MG TABS Take 10 mg by mouth at bedtime.   memantine (NAMENDA) 10 MG tablet TAKE 1 TABLET BY MOUTH TWICE A DAY  metoprolol tartrate (LOPRESSOR) 25 MG tablet TAKE 1 TABLET BY MOUTH TWICE A DAY   Multiple Vitamins-Minerals ([employer] SPECTRAVITE PO) Take 1 capsule by mouth daily.   polyethylene glycol (MIRALAX / GLYCOLAX) 17 g packet Take 17 g by mouth daily. (Patient taking differently: Take 17 g by mouth daily as needed.)   Probiotic Product (PROBIOTIC PO) Take 1 tablet by mouth  every other day.    QUEtiapine (SEROQUEL) 25 MG tablet TAKE 1/2-1 TABLET BY MOUTH AS NEEDED IN THE EVENING FOR AGITATION/HALLUCINATIONS   vitamin B-12 (CYANOCOBALAMIN) 1000 MCG tablet Take 1,000 mcg by mouth daily.   No facility-administered encounter medications on file as of 05/21/2021.    Allergies (verified) Alendronate sodium, Amlodipine besylate, Calcitonin (salmon), Plavix [clopidogrel bisulfate], and Simvastatin   History: Past Medical History:  Diagnosis Date   Asymptomatic gallstones    BRBPR (bright red blood per rectum)    Cataract 04/13/2018   bilateral eyes   Cholelithiasis    Clot    RLE blood clots; s/p IVC filter   Colitis - presumed infectious origin    one ER visit    Dementia ([place])    Duodenitis    Fall    Gastritis    GI bleed    Hemorrhoid    bleeding   Hiatal hernia    Hyperlipidemia    Hypertension    Osteoarthritis    Osteoporosis    Stroke ([place])    Syncope    Past Surgical History:  Procedure Laterality Date   ABDOMINAL HYSTERECTOMY     BSO- fibroids   ABI's     normal   ANTERIOR APPROACH HEMI HIP ARTHROPLASTY Right 12/08/2019   Procedure: ANTERIOR APPROACH HEMI HIP ARTHROPLASTY;  Surgeon: Rod Can, MD;  Location: [facility];  Service: Orthopedics;  Laterality: Right;   COLONOSCOPY  08/09/2019   Procedure: COLONOSCOPY;  Surgeon: Lucilla Lame, MD;  Location: [facility] ENDOSCOPY;  Service: Endoscopy;;   dexa  07/2003   osteoporosis   ESOPHAGOGASTRODUODENOSCOPY N/A 03/21/2014   Procedure: ESOPHAGOGASTRODUODENOSCOPY (EGD);  Surgeon: Milus Banister, MD;  Location: [place];  Service: Endoscopy;  Laterality: N/A;   FLEXIBLE SIGMOIDOSCOPY N/A 08/08/2019   Procedure: FLEXIBLE SIGMOIDOSCOPY;  Surgeon: Jonathon Bellows, MD;  [facility] END[facility]COPY;  Service: Gastroenterology;  Laterality: N/A;   FLEXIBLE SIGMOIDOSCOPY N/A 08/09/2019   Procedure: FLEXIBLE SIGMOIDOSCOPY;  Surgeon: Lucilla Lame, MD;  Location: [facility] ENDOSCOPY;  Service: Endoscopy;   Laterality: N/A;   HIP FRACTURE SURGERY     IR IVC FILTER PLMT / S&I /IMG GUID/MOD SED  07/06/2019   IVC FILTER INSERTION  07/2019   left foot brace     WRIST FRACTURE SURGERY  08/1999   R arm   WRIST FRACTURE SURGERY Left 02/12/2018   Family History  Problem Relation Age of Onset   Heart attack Father    Hypertension Father    Heart attack Mother    Throat cancer Brother    Breast cancer Daughter    Social History   Socioeconomic History   Marital status: Widowed    Spouse name: Not on file   Number of children: 5   Years of education: 60   Highest education level: Not on file  Occupational History   Occupation: retired    Fish farm manager: RETIRED  Tobacco Use   Smoking status: Never   Smokeless tobacco: Never  Vaping Use   Vaping Use: Never used  Substance and Sexual Activity   Alcohol use: No    Alcohol/week: 0.0 standard drinks  Drug use: No   Sexual activity: Never  Other Topics Concern   Not on file  Social History Narrative   Has 4 brothers, 1 sister   Lives with 1 adult grandchild and her daughter [name]   Has 4 daughters, 1 son   Right handed   Rare caffeine    Social Determinants of Health   Financial Resource Strain: Low Risk    Difficulty of Paying Living Expenses: Not hard at all  Food Insecurity: No Food Insecurity   Worried About Charity fundraiser in the Last Year: Never true   Ran Out of Food in the Last Year: Never true  Transportation Needs: No Transportation Needs   Lack of Transportation (Medical): No   Lack of Transportation (Non-Medical): No  Physical Activity: Insufficiently Active   Days of Exercise per Week: 7 days   Minutes of Exercise per Session: 10 min  Stress: Not on file  Social Connections: Socially Isolated   Frequency of Communication with Friends and Family: Not on file   Frequency of Social Gatherings with Friends and Family: More than three times a week   Attends Religious Services: Never   Marine scientist or  Organizations: No   Attends Archivist Meetings: Never   Marital Status: Widowed    Tobacco Counseling Counseling given: Not Answered   Clinical Intake:  Pre-visit preparation completed: Yes  Pain : No/denies pain     BMI - recorded: 29.4 Nutritional Status: BMI 25 -29 Overweight Nutritional Risks: None Diabetes: No     Diabetic?No  Interpreter Needed?: No  Information entered by :: Orrin Brigham LPN   Activities of Daily Living In your present state of health, do you have any difficulty performing the following activities: 05/21/2021 12/08/2020  Hearing? N N  Vision? N N  Comment - bilateral cataracts.  Doesn't wear glasses all the time  Difficulty concentrating or making decisions? Y Y  Comment Patient has dementia and being treated by PCP advanced dementia  Walking or climbing stairs? Y Y  Comment uses walker -  Dressing or bathing? Y Y  Comment Patient has assistance from daughter and son -  Doing errands, shopping? Tempie Donning  Comment patients daughter manages appointments and office visits -  Preparing Food and eating ? Tempie Donning  Comment daughter or son prepares food -  Using the Toilet? N Y  In the past six months, have you accidently leaked urine? Y N  Comment occasional -  Do you have problems with loss of bowel control? N N  Managing your Medications? Tempie Donning  Comment daughter and son manage meds -  Managing your Finances? Tempie Donning  Comment daughter manages -  Housekeeping or managing your Housekeeping? Tempie Donning  Comment family manages house keeping -  Some recent data might be hidden    Patient Care Team: Tower, Wynelle Fanny, MD as PCP - General Shirl Harris, OD as Referring Physician (Optometry) Dannielle Karvonen, RN as Case Manager  Indicate any recent Medical Services you may have received from other than [facility] providers in the past year (date may be approximate).     Assessment:   This is a routine wellness examination for Land[place].  Hearing/Vision  screen Hearing Screening - Comments:: No issues per daughter Vision Screening - Comments:: Last exam 2 years ago, wears glasses  Dietary issues and exercise activities discussed: Current Exercise Habits: Home exercise routine, Type of exercise: walking;stretching (family encourage walking around [employer] daily),  Time (Minutes): 10, Frequency (Times/Week): 7, Weekly Exercise (Minutes/Week): 70, Intensity: Mild   Goals Addressed             This Visit's Progress    Patient Stated       Daughter would like to keep Ms Bang safe as a goal.        Depression Screen PHQ 2/9 Scores 04/26/2020 07/14/2019 04/26/2019 04/15/2019 04/13/2018 04/09/2017 02/21/2017  PHQ - 2 Score 0 0 0 0 0 0 2  PHQ- 9 Score 0 - - 0 0 0 10    Fall Risk Fall Risk  05/21/2021 01/11/2021 12/08/2020 04/26/2020 07/14/2019  Falls in the past year? 0 1 0 1 1  Comment - - - tripped and fell -  Number falls in past yr: 0 0 0 1 1  Comment - - - - -  Injury with Fall? 0 1 - 1 1  Comment - broken ribs - broke hip Wrist and arm pain requiring and ED visit to evaluate, previous nondisplaced humeral fx.in 2019  Risk Factor Category  - - - - -  Risk for fall due to : Impaired balance/gait Mental status change - Medication side effect;Impaired balance/gait;History of fall(s) History of fall(s);Impaired balance/gait;Mental status change;Medication side effect  Risk for fall due to: Comment - dementia - - -  Follow up Falls prevention discussed Falls prevention discussed - Falls evaluation completed;Falls prevention discussed Falls evaluation completed    FALL RISK PREVENTION PERTAINING TO THE HOME:  Any stairs in or around the home? No  If so, are there any without handrails? No  Home free of loose throw rugs in walkways, pet beds, electrical cords, etc? Yes  Adequate lighting in your home to reduce risk of falls? Yes   ASSISTIVE DEVICES UTILIZED TO PREVENT FALLS:  Life alert? No  Use of a cane, walker or w/c? Yes  , walker Grab bars in the bathroom? Yes  Shower chair or bench in shower? Yes  Elevated toilet seat or a handicapped toilet? Yes   TIMED UP AND GO:  Was the test performed? No , visit completed over the phone   Cognitive Function: Cognitive status,  Patient has current diagnosis of cognitive impairment. Patient is followed by PCP for ongoing assessment. Patient is unable to complete screening 6CIT or MMSE.   MMSE - Mini Mental State Exam 04/26/2020 04/15/2019 04/13/2018 04/09/2017 12/05/2016  Not completed: Unable to complete Unable to complete Unable to complete (No Data) -  Orientation to time - - - - -  Orientation to Place - - - - -  Registration - - - - -  Attention/ Calculation - - - - -  Recall - - - - -  Language- name 2 objects - - - - -  Language- repeat - - - - -  Language- follow 3 step command - - - - -  Language- read & follow direction - - - - -  Write a sentence - - - - 1  Copy design - - - - 1  Total score - - - - -        Immunizations Immunization History  Administered Date(s) Administered   Influenza Split 06/04/2011, 07/10/2012   Influenza,inj,Quad PF,6+ Mos 07/12/2014, 08/09/2016, 04/09/2017, 04/17/2018, 04/19/2019, 04/26/2020   Pneumococcal Conjugate-13 01/11/2014   Pneumococcal Polysaccharide-23 06/04/2011   Td 08/27/2004    TDAP status: Due, Education has been provided regarding the importance of this vaccine. Advised may receive this vaccine at local pharmacy or Health  Dept. Aware to provide a copy of the vaccination record if obtained from local pharmacy or Health Dept. Verbalized acceptance and understanding.  Flu Vaccine status: Due, Education has been provided regarding the importance of this vaccine. Advised may receive this vaccine at local pharmacy or Health Dept. Aware to provide a copy of the vaccination record if obtained from local pharmacy or Health Dept. Verbalized acceptance and understanding.  Pneumococcal vaccine status: Up to  date  Covid-19 vaccine status: Declined, Education has been provided regarding the importance of this vaccine but patient still declined. Advised may receive this vaccine at local pharmacy or Health Dept.or vaccine clinic. Aware to provide a copy of the vaccination record if obtained from local pharmacy or Health Dept. Verbalized acceptance and understanding.  Qualifies for Shingles Vaccine? Yes   Zostavax completed No   Shingrix Completed?: No.    Education has been provided regarding the importance of this vaccine. Patient has been advised to call insurance company to determine out of pocket expense if they have not yet received this vaccine. Advised may also receive vaccine at local pharmacy or Health Dept. Verbalized acceptance and understanding.  Screening Tests Health Maintenance  Topic Date Due   Zoster Vaccines- Shingrix (1 of 2) Never done   INFLUENZA VACCINE  01/29/2021   MAMMOGRAM  04/26/2024 (Originally 11/18/2018)   COVID-19 Vaccine (1) 05/12/2024 (Originally 04/24/1930)   TETANUS/TDAP  08/26/2024 (Originally 08/27/2014)   Pneumonia Vaccine 35+ Years old  Completed   DEXA SCAN  Completed   HPV VACCINES  Aged Out    Health Maintenance  Health Maintenance Due  Topic Date Due   Zoster Vaccines- Shingrix (1 of 2) Never done   INFLUENZA VACCINE  01/29/2021    Colorectal cancer screening: No longer required.   Mammogram status: due, last completed 11/17/17, will discuss with PCP  Bone Density status: No longer required  Lung Cancer Screening: (Low Dose CT Chest recommended if Age 18-80 years, 30 pack-year currently smoking OR have quit w/in 15years.) does not qualify.    Additional Screening:  Hepatitis C Screening: does not qualify;   Vision Screening: Recommended annual ophthalmology exams for early detection of glaucoma and other disorders of the eye. Is the patient up to date with their annual eye exam?  No  Who is the provider or what is the name of the office in  which the patient attends annual eye exams? N/A   Dental Screening: Recommended annual dental exams for proper oral hygiene  Community Resource Referral / Chronic Care Management: CRR required this visit?  No   CCM required this visit?  No      Plan:     I have personally reviewed and noted the following in the patient's chart:   Medical and social history Use of alcohol, tobacco or illicit drugs  Current medications and supplements including opioid prescriptions.  Functional ability and status Nutritional status Physical activity Advanced directives List of other physicians Hospitalizations, surgeries, and ER visits in previous 12 months Vitals Screenings to include cognitive, depression, and falls Referrals and appointments  In addition, I have reviewed and discussed with patient certain preventive protocols, quality metrics, and best practice recommendations. A written personalized care plan for preventive services as well as general preventive health recommendations were provided to patient.   Due to this being a telephonic visit, the after visit summary with patients personalized plan was offered to patient via mail or my-chart. Patient daughter / [name] would like to access on my-chart.  Loma Messing, LPN   [id number]   Nurse Health Advisor  Nurse Notes: None

## 2021-05-21 ENCOUNTER — Ambulatory Visit (INDEPENDENT_AMBULATORY_CARE_PROVIDER_SITE_OTHER): Payer: Medicare Other

## 2021-05-21 VITALS — Ht 60.0 in | Wt 150.0 lb

## 2021-05-21 DIAGNOSIS — Z0000 Encounter for general adult medical examination without abnormal findings: Secondary | ICD-10-CM | POA: Diagnosis not present

## 2021-05-21 NOTE — Patient Instructions (Signed)
Wendy Krueger , Thank you for taking time to complete your Medicare Wellness Visit. I appreciate your ongoing commitment to your health goals. Please review the following plan we discussed and let me know if I can assist you in the future.   Screening recommendations/referrals: Colonoscopy: no longer required Mammogram: due, last completed 11/17/17, will discuss with PCP Bone Density: no longer required Recommended yearly ophthalmology/optometry visit for glaucoma screening and checkup Recommended yearly dental visit for hygiene and checkup  Vaccinations: Influenza vaccine: Due-May obtain vaccine at our office or your local pharmacy.  Pneumococcal vaccine: up to date Tdap vaccine: Due-May obtain vaccine at your local pharmacy.  Shingles vaccine: May obtain vaccine at  your local pharmacy.    Covid-19: newest booster available at your local pharmacy  Advanced directives: copy on file  Conditions/risks identified: see problem list  Next appointment: Follow up in one year for your annual wellness visit    Preventive Care 65 Years and Older, Female Preventive care refers to lifestyle choices and visits with your health care provider that can promote health and wellness. What does preventive care include? A yearly physical exam. This is also called an annual well check. Dental exams once or twice a year. Routine eye exams. Ask your health care provider how often you should have your eyes checked. Personal lifestyle choices, including: Daily care of your teeth and gums. Regular physical activity. Eating a healthy diet. Avoiding tobacco and drug use. Limiting alcohol use. Practicing safe sex. Taking low-dose aspirin every day. Taking vitamin and mineral supplements as recommended by your health care provider. What happens during an annual well check? The services and screenings done by your health care provider during your annual well check will depend on your age, overall health,  lifestyle risk factors, and family history of disease. Counseling  Your health care provider may ask you questions about your: Alcohol use. Tobacco use. Drug use. Emotional well-being. Home and relationship well-being. Sexual activity. Eating habits. History of falls. Memory and ability to understand (cognition). Work and work Statistician. Reproductive health. Screening  You may have the following tests or measurements: Height, weight, and BMI. Blood pressure. Lipid and cholesterol levels. These may be checked every 5 years, or more frequently if you are over 34 years old. Skin check. Lung cancer screening. You may have this screening every year starting at age 51 if you have a 30-pack-year history of smoking and currently smoke or have quit within the past 15 years. Fecal occult blood test (FOBT) of the stool. You may have this test every year starting at age 25. Flexible sigmoidoscopy or colonoscopy. You may have a sigmoidoscopy every 5 years or a colonoscopy every 10 years starting at age 60. Hepatitis C blood test. Hepatitis B blood test. Sexually transmitted disease (STD) testing. Diabetes screening. This is done by checking your blood sugar (glucose) after you have not eaten for a while (fasting). You may have this done every 1-3 years. Bone density scan. This is done to screen for osteoporosis. You may have this done starting at age 67. Mammogram. This may be done every 1-2 years. Talk to your health care provider about how often you should have regular mammograms. Talk with your health care provider about your test results, treatment options, and if necessary, the need for more tests. Vaccines  Your health care provider may recommend certain vaccines, such as: Influenza vaccine. This is recommended every year. Tetanus, diphtheria, and acellular pertussis (Tdap, Td) vaccine. You may need a Td  booster every 10 years. Zoster vaccine. You may need this after age  89. Pneumococcal 13-valent conjugate (PCV13) vaccine. One dose is recommended after age 75. Pneumococcal polysaccharide (PPSV23) vaccine. One dose is recommended after age 88. Talk to your health care provider about which screenings and vaccines you need and how often you need them. This information is not intended to replace advice given to you by your health care provider. Make sure you discuss any questions you have with your health care provider. Document Released: 07/14/2015 Document Revised: 03/06/2016 Document Reviewed: 04/18/2015 Elsevier Interactive Patient Education  2017 [place] Prevention in the Home Falls can cause injuries. They can happen to people of all ages. There are many things you can do to make your home safe and to help prevent falls. What can I do on the outside of my home? Regularly fix the edges of walkways and driveways and fix any cracks. Remove anything that might make you trip as you walk through a door, such as a raised step or threshold. Trim any bushes or trees on the path to your home. Use bright outdoor lighting. Clear any walking paths of anything that might make someone trip, such as rocks or tools. Regularly check to see if handrails are loose or broken. Make sure that both sides of any steps have handrails. Any raised decks and porches should have guardrails on the edges. Have any leaves, snow, or ice cleared regularly. Use sand or salt on walking paths during winter. Clean up any spills in your garage right away. This includes oil or grease spills. What can I do in the bathroom? Use night lights. Install grab bars by the toilet and in the tub and shower. Do not use towel bars as grab bars. Use non-skid mats or decals in the tub or shower. If you need to sit down in the shower, use a plastic, non-slip stool. Keep the floor dry. Clean up any water that spills on the floor as soon as it happens. Remove soap buildup in the tub or shower  regularly. Attach bath mats securely with double-sided non-slip rug tape. Do not have throw rugs and other things on the floor that can make you trip. What can I do in the bedroom? Use night lights. Make sure that you have a light by your bed that is easy to reach. Do not use any sheets or blankets that are too big for your bed. They should not hang down onto the floor. Have a firm chair that has side arms. You can use this for support while you get dressed. Do not have throw rugs and other things on the floor that can make you trip. What can I do in the kitchen? Clean up any spills right away. Avoid walking on wet floors. Keep items that you use a lot in easy-to-reach places. If you need to reach something above you, use a strong step stool that has a grab bar. Keep electrical cords out of the way. Do not use floor polish or wax that makes floors slippery. If you must use wax, use non-skid floor wax. Do not have throw rugs and other things on the floor that can make you trip. What can I do with my stairs? Do not leave any items on the stairs. Make sure that there are handrails on both sides of the stairs and use them. Fix handrails that are broken or loose. Make sure that handrails are as long as the stairways. Check any carpeting  to make sure that it is firmly attached to the stairs. Fix any carpet that is loose or worn. Avoid having throw rugs at the top or bottom of the stairs. If you do have throw rugs, attach them to the floor with carpet tape. Make sure that you have a light switch at the top of the stairs and the bottom of the stairs. If you do not have them, ask someone to add them for you. What else can I do to help prevent falls? Wear shoes that: Do not have high heels. Have rubber bottoms. Are comfortable and fit you well. Are closed at the toe. Do not wear sandals. If you use a stepladder: Make sure that it is fully opened. Do not climb a closed stepladder. Make sure that  both sides of the stepladder are locked into place. Ask someone to hold it for you, if possible. Clearly mark and make sure that you can see: Any grab bars or handrails. First and last steps. Where the edge of each step is. Use tools that help you move around (mobility aids) if they are needed. These include: Canes. Walkers. Scooters. Crutches. Turn on the lights when you go into a dark area. Replace any light bulbs as soon as they burn out. Set up your furniture so you have a clear path. Avoid moving your furniture around. If any of your floors are uneven, fix them. If there are any pets around you, be aware of where they are. Review your medicines with your doctor. Some medicines can make you feel dizzy. This can increase your chance of falling. Ask your doctor what other things that you can do to help prevent falls. This information is not intended to replace advice given to you by your health care provider. Make sure you discuss any questions you have with your health care provider. Document Released: 04/13/2009 Document Revised: 11/23/2015 Document Reviewed: 07/22/2014 Elsevier Interactive Patient Education  2017 [employer].

## 2021-05-30 ENCOUNTER — Other Ambulatory Visit: Payer: Medicare Other | Admitting: Hospice

## 2021-05-30 ENCOUNTER — Other Ambulatory Visit: Payer: Self-pay

## 2021-05-30 DIAGNOSIS — I10 Essential (primary) hypertension: Secondary | ICD-10-CM

## 2021-05-30 DIAGNOSIS — F03C0 Unspecified dementia, severe, without behavioral disturbance, psychotic disturbance, mood disturbance, and anxiety: Secondary | ICD-10-CM | POA: Diagnosis not present

## 2021-05-30 DIAGNOSIS — R451 Restlessness and agitation: Secondary | ICD-10-CM | POA: Diagnosis not present

## 2021-05-30 DIAGNOSIS — F03918 Unspecified dementia, unspecified severity, with other behavioral disturbance: Secondary | ICD-10-CM | POA: Diagnosis not present

## 2021-05-30 DIAGNOSIS — K5901 Slow transit constipation: Secondary | ICD-10-CM | POA: Diagnosis not present

## 2021-05-30 DIAGNOSIS — Z515 Encounter for palliative care: Secondary | ICD-10-CM

## 2021-05-30 NOTE — Progress Notes (Signed)
[facility] Consult Note Telephone: [phone]  Fax: [phone]  PATIENT NAME: Wendy Krueger DOB: Jul 21, 1929 MRN: [id number]  PRIMARY CARE PROVIDER:   Abner Greenspan, MD [place], Wynelle Fanny, MD [place],  [place] [address]  REFERRING PROVIDER: Abner Greenspan, MD Abner Greenspan, MD [place],  [place] [address]  RESPONSIBLE PARTY:  Melinda Crutch - son lives close by [phone]     Name [place] Daughter [phone]  [phone]   Alexander Bergeron Daughter [phone]    [place] Daughter [phone]         Visit is to build trust and highlight Palliative Medicine as specialized medical care for people living with serious illness, aimed at facilitating better quality of life through symptoms relief, assisting with advance care planning and complex medical decision making. Chryl Heck NP shadowing. [name] is at home with patient during visit. This is a follow up visit.  RECOMMENDATIONS/PLAN:   Advance Care Planning/Code Status: Patient is a DO NOT RESUSCITATE   Goals of Care: Goals include to maximize quality of life and symptom management.  MOST selections include limited additional intervention, IV fluids if indicated, antibiotics if indicated, no feeding tube.  Family is interested in hospice service in the future when patient qualifies for it.  Visit consisted of counseling and education dealing with the complex and emotionally intense issues of symptom management and palliative care in the setting of serious and potentially life-threatening illness. Palliative care team will continue to support patient, patient's family, and medical team.  Symptom management/Plan:  Dementia: memory loss/confusion at baseline. Education on what to expect;encouraged participation in activities, reminiscence. Fall and safety precautions in  place. Agitation: Managed with Seroquel. Deescalation techniques discussed. Constipation managed with Fibre One and Colace. Follow up: Palliative care will continue to follow for complex medical decision making, advance care planning, and clarification of goals. Return 6 weeks or prn. Encouraged to call provider sooner with any concerns.  CHIEF COMPLAINT: Palliative follow up  HISTORY OF PRESENT ILLNESS:  Wendy Krueger a 87 y.o. female with multiple medical problems including , Dementia, agitation  related ton Dementia  well managed with Seroquel. History of hypertension, closed right hip fracture following a fall, insomnia.  Patient denies pain/discomfort, no respiratory distress. Daughter [name] with no concerns today.  No report of fall or hospitalization since last visit.  History obtained from review of EMR, discussion with primary team, family and/or patient. Records reviewed and summarized above. All 10 point systems reviewed and are negative except as documented in history of present illness above  Review and summarization of Epic records shows history from other than patient.   Palliative Care was asked to follow this patient o help address complex decision making in the context of advance care planning and goals of care clarification.   PHYSICAL EXAM  BP 122/76 P 77 R 18 02 97% RA General: In no acute distress, appropriately dressed, FLACC 0 Cardiovascular: regular rate and rhythm; no edema in BLE Pulmonary: no cough, no increased work of breathing, normal respiratory effort Abdomen: soft, non tender, no guarding, positive bowel sounds in all quadrants GU:  no suprapubic tenderness Eyes: Normal lids, no discharge ENMT: Moist mucous membranes Musculoskeletal:  weakness, ambulatory with rolling walker, stand by assist Skin: no rash to visible skin, warm without cyanosis,  Psych: non-anxious affect Neurological: Weakness but otherwise non focal, memory  loss/confusion Heme/lymph/immuno: no bruises, no bleeding  PERTINENT MEDICATIONS:  Outpatient Encounter Medications as of 05/30/2021  Medication Sig   acetaminophen (TYLENOL) 500 MG tablet Take 500 mg by mouth every 6 (six) hours as needed.   Ascorbic Acid (VITAMIN C) 1000 MG tablet Take 1,000 mg by mouth daily.   Chlorpheniramine Maleate (ALLERGY PO) Take 1 tablet by mouth daily.    donepezil (ARICEPT) 10 MG tablet TAKE 1 TABLET BY MOUTH AT BEDTIME NEEDS APPT FOR REFILLS   hydrochlorothiazide (HYDRODIURIL) 25 MG tablet TAKE 1 TABLET BY MOUTH EVERY DAY FOR BLOOD PRESSURE   iron polysaccharides (NIFEREX) 150 MG capsule TAKE 1 CAPSULE (150 MG TOTAL) BY MOUTH EVERY OTHER DAY.   KLOR-CON M10 10 MEQ tablet TAKE 2 TABLETS BY MOUTH DAILY   losartan (COZAAR) 100 MG tablet TAKE 1 TABLET BY MOUTH EVERY DAY   Melatonin 10 MG TABS Take 10 mg by mouth at bedtime.   memantine (NAMENDA) 10 MG tablet TAKE 1 TABLET BY MOUTH TWICE A DAY   metoprolol tartrate (LOPRESSOR) 25 MG tablet TAKE 1 TABLET BY MOUTH TWICE A DAY   Multiple Vitamins-Minerals ([employer] SPECTRAVITE PO) Take 1 capsule by mouth daily.   polyethylene glycol (MIRALAX / GLYCOLAX) 17 g packet Take 17 g by mouth daily. (Patient taking differently: Take 17 g by mouth daily as needed.)   Probiotic Product (PROBIOTIC PO) Take 1 tablet by mouth every other day.    QUEtiapine (SEROQUEL) 25 MG tablet TAKE 1/2-1 TABLET BY MOUTH AS NEEDED IN THE EVENING FOR AGITATION/HALLUCINATIONS   vitamin B-12 (CYANOCOBALAMIN) 1000 MCG tablet Take 1,000 mcg by mouth daily.   No facility-administered encounter medications on file as of 05/30/2021.    HOSPICE ELIGIBILITY/DIAGNOSIS: TBD  PAST MEDICAL HISTORY:  Past Medical History:  Diagnosis Date   Asymptomatic gallstones    BRBPR (bright red blood per rectum)    Cataract 04/13/2018   bilateral eyes   Cholelithiasis    Clot    RLE blood clots; s/p IVC filter   Colitis - presumed infectious origin    one ER visit     Dementia ([place])    Duodenitis    Fall    Gastritis    GI bleed    Hemorrhoid    bleeding   Hiatal hernia    Hyperlipidemia    Hypertension    Osteoarthritis    Osteoporosis    Stroke ([place])    Syncope      ALLERGIES:  Allergies  Allergen Reactions   Alendronate Sodium Other (See Comments)    Leg pain    Amlodipine Besylate Hives   Calcitonin ([place]) Other (See Comments)     headache/ head pressure   Plavix [Clopidogrel Bisulfate] Other (See Comments)    drowsy     Simvastatin Other (See Comments)    Leg pain       I spent 40 minutes providing this consultation; this includes time spent with patient/family, chart review and documentation. More than 50% of the time in this consultation was spent on counseling and coordinating communication   Thank you for the opportunity to participate in the care of Wendy Krueger Please call our office at [phone] if we can be of additional assistance.  Note: Portions of this note were generated with [employer]. Dictation errors may occur despite best attempts at proofreading.  Teodoro Spray, NP

## 2021-06-15 ENCOUNTER — Telehealth: Payer: Medicare Other

## 2021-06-19 ENCOUNTER — Other Ambulatory Visit: Payer: Self-pay

## 2021-06-19 ENCOUNTER — Inpatient Hospital Stay
Admission: EM | Admit: 2021-06-19 | Discharge: 2021-06-22 | DRG: 871 | Disposition: A | Discharge status: Home Health | Payer: Medicare Other | Attending: Student in an Organized Health Care Education/Training Program | Admitting: Student in an Organized Health Care Education/Training Program

## 2021-06-19 ENCOUNTER — Emergency Department: Payer: Medicare Other

## 2021-06-19 DIAGNOSIS — Z96641 Presence of right artificial hip joint: Secondary | ICD-10-CM | POA: Diagnosis present

## 2021-06-19 DIAGNOSIS — E876 Hypokalemia: Secondary | ICD-10-CM | POA: Diagnosis not present

## 2021-06-19 DIAGNOSIS — Z66 Do not resuscitate: Secondary | ICD-10-CM | POA: Diagnosis present

## 2021-06-19 DIAGNOSIS — I10 Essential (primary) hypertension: Secondary | ICD-10-CM | POA: Diagnosis present

## 2021-06-19 DIAGNOSIS — N3000 Acute cystitis without hematuria: Secondary | ICD-10-CM

## 2021-06-19 DIAGNOSIS — I129 Hypertensive chronic kidney disease with stage 1 through stage 4 chronic kidney disease, or unspecified chronic kidney disease: Secondary | ICD-10-CM | POA: Diagnosis present

## 2021-06-19 DIAGNOSIS — Z79899 Other long term (current) drug therapy: Secondary | ICD-10-CM

## 2021-06-19 DIAGNOSIS — Z0389 Encounter for observation for other suspected diseases and conditions ruled out: Secondary | ICD-10-CM | POA: Diagnosis not present

## 2021-06-19 DIAGNOSIS — Z8249 Family history of ischemic heart disease and other diseases of the circulatory system: Secondary | ICD-10-CM | POA: Diagnosis not present

## 2021-06-19 DIAGNOSIS — U071 COVID-19: Secondary | ICD-10-CM | POA: Diagnosis present

## 2021-06-19 DIAGNOSIS — Z8673 Personal history of transient ischemic attack (TIA), and cerebral infarction without residual deficits: Secondary | ICD-10-CM | POA: Diagnosis not present

## 2021-06-19 DIAGNOSIS — N1832 Chronic kidney disease, stage 3b: Secondary | ICD-10-CM | POA: Diagnosis present

## 2021-06-19 DIAGNOSIS — F419 Anxiety disorder, unspecified: Secondary | ICD-10-CM | POA: Diagnosis not present

## 2021-06-19 DIAGNOSIS — Z28310 Unvaccinated for covid-19: Secondary | ICD-10-CM | POA: Diagnosis not present

## 2021-06-19 DIAGNOSIS — N390 Urinary tract infection, site not specified: Secondary | ICD-10-CM | POA: Diagnosis not present

## 2021-06-19 DIAGNOSIS — E559 Vitamin D deficiency, unspecified: Secondary | ICD-10-CM | POA: Diagnosis present

## 2021-06-19 DIAGNOSIS — Z86718 Personal history of other venous thrombosis and embolism: Secondary | ICD-10-CM | POA: Diagnosis not present

## 2021-06-19 DIAGNOSIS — A419 Sepsis, unspecified organism: Secondary | ICD-10-CM | POA: Diagnosis present

## 2021-06-19 DIAGNOSIS — R410 Disorientation, unspecified: Secondary | ICD-10-CM | POA: Diagnosis not present

## 2021-06-19 DIAGNOSIS — Z888 Allergy status to other drugs, medicaments and biological substances status: Secondary | ICD-10-CM | POA: Diagnosis not present

## 2021-06-19 DIAGNOSIS — I517 Cardiomegaly: Secondary | ICD-10-CM | POA: Diagnosis not present

## 2021-06-19 DIAGNOSIS — F03B4 Unspecified dementia, moderate, with anxiety: Secondary | ICD-10-CM | POA: Diagnosis present

## 2021-06-19 DIAGNOSIS — R404 Transient alteration of awareness: Secondary | ICD-10-CM | POA: Diagnosis not present

## 2021-06-19 DIAGNOSIS — Z7401 Bed confinement status: Secondary | ICD-10-CM | POA: Diagnosis not present

## 2021-06-19 DIAGNOSIS — A4151 Sepsis due to Escherichia coli [E. coli]: Secondary | ICD-10-CM | POA: Diagnosis present

## 2021-06-19 DIAGNOSIS — M810 Age-related osteoporosis without current pathological fracture: Secondary | ICD-10-CM | POA: Diagnosis present

## 2021-06-19 DIAGNOSIS — Z808 Family history of malignant neoplasm of other organs or systems: Secondary | ICD-10-CM

## 2021-06-19 DIAGNOSIS — M1990 Unspecified osteoarthritis, unspecified site: Secondary | ICD-10-CM | POA: Diagnosis present

## 2021-06-19 DIAGNOSIS — Z803 Family history of malignant neoplasm of breast: Secondary | ICD-10-CM

## 2021-06-19 DIAGNOSIS — R4182 Altered mental status, unspecified: Secondary | ICD-10-CM

## 2021-06-19 DIAGNOSIS — D509 Iron deficiency anemia, unspecified: Secondary | ICD-10-CM | POA: Diagnosis present

## 2021-06-19 DIAGNOSIS — R0602 Shortness of breath: Secondary | ICD-10-CM | POA: Diagnosis present

## 2021-06-19 DIAGNOSIS — E785 Hyperlipidemia, unspecified: Secondary | ICD-10-CM | POA: Diagnosis present

## 2021-06-19 DIAGNOSIS — Z95828 Presence of other vascular implants and grafts: Secondary | ICD-10-CM | POA: Diagnosis not present

## 2021-06-19 DIAGNOSIS — R000 Tachycardia, unspecified: Secondary | ICD-10-CM | POA: Diagnosis not present

## 2021-06-19 DIAGNOSIS — G4700 Insomnia, unspecified: Secondary | ICD-10-CM | POA: Diagnosis present

## 2021-06-19 LAB — COMPREHENSIVE METABOLIC PANEL
ALT: 10 U/L (ref 0–44)
AST: 23 U/L (ref 15–41)
Albumin: 3.8 g/dL (ref 3.5–5.0)
Alkaline Phosphatase: 68 U/L (ref 38–126)
Anion gap: 7 (ref 5–15)
BUN: 23 mg/dL (ref 8–23)
CO2: 26 mmol/L (ref 22–32)
Calcium: 9.6 mg/dL (ref 8.9–10.3)
Chloride: 106 mmol/L (ref 98–111)
Creatinine, Ser: 1.16 mg/dL — ABNORMAL HIGH (ref 0.44–1.00)
GFR, Estimated: 45 mL/min — ABNORMAL LOW (ref >60)
Glucose, Bld: 112 mg/dL — ABNORMAL HIGH (ref 70–99)
Potassium: 3.6 mmol/L (ref 3.5–5.1)
Sodium: 139 mmol/L (ref 135–145)
Total Bilirubin: 0.7 mg/dL (ref 0.3–1.2)
Total Protein: 7.2 g/dL (ref 6.5–8.1)

## 2021-06-19 LAB — CBC WITH DIFFERENTIAL/PLATELET
Abs Immature Granulocytes: 0.03 10*3/uL (ref 0.00–0.07)
Basophils Absolute: 0 10*3/uL (ref 0.0–0.1)
Basophils Relative: 0 %
Eosinophils Absolute: 0 10*3/uL (ref 0.0–0.5)
Eosinophils Relative: 0 %
HCT: 39.5 % (ref 36.0–46.0)
Hemoglobin: 13 g/dL (ref 12.0–15.0)
Immature Granulocytes: 1 %
Lymphocytes Relative: 11 %
Lymphs Abs: 0.5 10*3/uL — ABNORMAL LOW (ref 0.7–4.0)
MCH: 31 pg (ref 26.0–34.0)
MCHC: 32.9 g/dL (ref 30.0–36.0)
MCV: 94.3 fL (ref 80.0–100.0)
Monocytes Absolute: 0.5 10*3/uL (ref 0.1–1.0)
Monocytes Relative: 10 %
Neutro Abs: 3.7 10*3/uL (ref 1.7–7.7)
Neutrophils Relative %: 78 %
Platelets: 137 10*3/uL — ABNORMAL LOW (ref 150–400)
RBC: 4.19 MIL/uL (ref 3.87–5.11)
RDW: 14.1 % (ref 11.5–15.5)
WBC: 4.7 10*3/uL (ref 4.0–10.5)
nRBC: 0 % (ref 0.0–0.2)

## 2021-06-19 LAB — URINALYSIS, COMPLETE (UACMP) WITH MICROSCOPIC
Bilirubin Urine: NEGATIVE
Glucose, UA: NEGATIVE mg/dL
Ketones, ur: NEGATIVE mg/dL
Nitrite: POSITIVE — AB
Protein, ur: NEGATIVE mg/dL
Specific Gravity, Urine: 1.02 (ref 1.005–1.030)
Squamous Epithelial / HPF: NONE SEEN (ref 0–5)
pH: 6 (ref 5.0–8.0)

## 2021-06-19 LAB — APTT: aPTT: 29 seconds (ref 24–36)

## 2021-06-19 LAB — MAGNESIUM: Magnesium: 1.9 mg/dL (ref 1.7–2.4)

## 2021-06-19 LAB — LACTIC ACID, PLASMA
Lactic Acid, Venous: 2 mmol/L (ref 0.5–1.9)
Lactic Acid, Venous: 3.5 mmol/L (ref 0.5–1.9)

## 2021-06-19 LAB — PROCALCITONIN: Procalcitonin: <0.1 ng/mL

## 2021-06-19 LAB — RESP PANEL BY RT-PCR (FLU A&B, COVID) ARPGX2
Influenza A by PCR: NEGATIVE
Influenza B by PCR: NEGATIVE
SARS Coronavirus 2 by RT PCR: POSITIVE — AB

## 2021-06-19 LAB — PROTIME-INR
INR: 1 (ref 0.8–1.2)
Prothrombin Time: 13.3 seconds (ref 11.4–15.2)

## 2021-06-19 MED ORDER — ENOXAPARIN SODIUM 30 MG/0.3ML IJ SOSY: 30.0000 mg | PREFILLED_SYRINGE | INTRAMUSCULAR | Status: DC

## 2021-06-19 MED ORDER — MEMANTINE HCL 5 MG PO TABS
10.0000 mg | ORAL_TABLET | Freq: Two times a day (BID) | ORAL | Status: DC
  Administered 2021-06-20 – 2021-06-22 (×5): 10 mg via ORAL
  Filled 2021-06-19 (×5): qty 2

## 2021-06-19 MED ORDER — ACETAMINOPHEN 325 MG PO TABS
650.0000 mg | ORAL_TABLET | Freq: Four times a day (QID) | ORAL | Status: DC | PRN
  Filled 2021-06-19: qty 2

## 2021-06-19 MED ORDER — ENOXAPARIN SODIUM 40 MG/0.4ML IJ SOSY: 40.0000 mg | PREFILLED_SYRINGE | INTRAMUSCULAR | Status: DC

## 2021-06-19 MED ORDER — SODIUM CHLORIDE 0.9 % IV SOLN
2.0000 g | INTRAVENOUS | Status: DC
  Administered 2021-06-20 – 2021-06-21 (×2): 2 g via INTRAVENOUS
  Filled 2021-06-19 (×3): qty 20

## 2021-06-19 MED ORDER — SODIUM CHLORIDE 0.9 % IV SOLN
1.0000 g | Freq: Once | INTRAVENOUS | Status: AC
  Administered 2021-06-19: 23:00:00 1 g via INTRAVENOUS
  Filled 2021-06-19: qty 10

## 2021-06-19 MED ORDER — ACETAMINOPHEN 650 MG RE SUPP: 650.0000 mg | Freq: Four times a day (QID) | RECTAL | Status: DC | PRN

## 2021-06-19 MED ORDER — LACTATED RINGERS IV BOLUS
1000.0000 mL | Freq: Once | INTRAVENOUS | Status: AC
  Administered 2021-06-20: 02:00:00 1000 mL via INTRAVENOUS

## 2021-06-19 MED ORDER — ONDANSETRON HCL 4 MG PO TABS: 4.0000 mg | ORAL_TABLET | Freq: Four times a day (QID) | ORAL | Status: DC | PRN

## 2021-06-19 MED ORDER — MELATONIN 5 MG PO TABS
10.0000 mg | ORAL_TABLET | Freq: Every evening | ORAL | Status: DC | PRN
  Administered 2021-06-21: 22:00:00 10 mg via ORAL
  Filled 2021-06-19 (×2): qty 2

## 2021-06-19 MED ORDER — METOPROLOL TARTRATE 25 MG PO TABS
25.0000 mg | ORAL_TABLET | Freq: Two times a day (BID) | ORAL | Status: DC
  Administered 2021-06-20 – 2021-06-22 (×5): 25 mg via ORAL
  Filled 2021-06-19 (×5): qty 1

## 2021-06-19 MED ORDER — ONDANSETRON HCL 4 MG/2ML IJ SOLN: 4.0000 mg | Freq: Four times a day (QID) | INTRAMUSCULAR | Status: DC | PRN

## 2021-06-19 MED ORDER — DONEPEZIL HCL 5 MG PO TABS
10.0000 mg | ORAL_TABLET | Freq: Every day | ORAL | Status: DC
  Administered 2021-06-20 – 2021-06-21 (×2): 10 mg via ORAL
  Filled 2021-06-19 (×2): qty 2

## 2021-06-19 NOTE — ED Provider Notes (Addendum)
[facility] Emergency Department Provider Note  ____________________________________________   Event Date/Time   First MD Initiated Contact with Patient 06/19/21 2047     (approximate)  I have reviewed the triage vital signs and the nursing notes.   HISTORY  Chief Complaint Altered Mental Status   HPI Wendy Krueger is a 87 y.o. female with a past medical history of dementia with occasional agitation managed with Seroquel, HTN, recent right hip fracture from a fall, DVT status post IVC filter, HTN, HDL, osteoporosis and CVA who presents for assessment of altered mental status.  Patient is unable find any history on arrival secondary to altered mental status.  Per family at bedside she is history of dementia but is calm which more confused over the last couple days.  There is someone at home who also has COVID and patient has reportedly had a strong urinary odor and they are concerned about UTI.  No recent falls or injuries or other clear symptoms per family.  No other history is immediate available on patient presentation.         Past Medical History:  Diagnosis Date   Asymptomatic gallstones    BRBPR (bright red blood per rectum)    Cataract 04/13/2018   bilateral eyes   Cholelithiasis    Clot    RLE blood clots; s/p IVC filter   Colitis - presumed infectious origin    one ER visit    Dementia ([place])    Duodenitis    Fall    Gastritis    GI bleed    Hemorrhoid    bleeding   Hiatal hernia    Hyperlipidemia    Hypertension    Osteoarthritis    Osteoporosis    Stroke [facility]    Syncope     Patient Active Problem List   Diagnosis Date Noted   Sepsis ([place]) 06/19/2021   Closed right hip fracture ([place]) 12/07/2019   Pedal edema 08/13/2019   Polyp of colon    Gastrointestinal hemorrhage    Chronic renal failure, stage 3b ([place])    DVT of lower extremity, bilateral ([place]) 08/06/2019   Bleeding hemorrhoids 08/06/2019   DVT, lower extremity,  distal, acute, bilateral ([place]) 08/06/2019   Advanced dementia 08/02/2019   Abnormal CT of the abdomen    External hemorrhoids    Transient alteration of awareness 07/14/2018   Chronic diarrhea 02/17/2018   History of fracture of radius 02/17/2018   Hypokalemia 02/09/2018   Hypotension 02/09/2018   History of lower GI bleeding 02/09/2018   Acute blood loss anemia 02/09/2018   History of CVA (cerebrovascular accident) 03/10/2016   Carotid aneurysm, right ([place]) 02/21/2016   Lipoma of shoulder 03/27/2015   History of closed Colles' fracture 09/06/2014   Allergic rhinitis 81/85/6314   Diastolic dysfunction 97/08/6376   Iron deficiency anemia 03/25/2014   History of GI bleed 03/21/2014   BRBPR (bright red blood per rectum) 03/19/2014   Anxiety 09/01/2013   Encounter for Medicare annual wellness exam 01/08/2013   Gallstones 11/11/2011   Arterial ischemic stroke, 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   Risk for falls 06/04/2011   Constipation 03/05/2011   Vitamin D deficiency 03/28/2009   HYPERCHOLESTEROLEMIA, PURE 03/17/2007   Essential hypertension 03/16/2007   OSTEOARTHRITIS 03/16/2007   Osteoporosis 03/16/2007    Past Surgical History:  Procedure Laterality Date   ABDOMINAL HYSTERECTOMY     BSO- fibroids   ABI's     normal   ANTERIOR APPROACH HEMI HIP ARTHROPLASTY Right 12/08/2019  Procedure: ANTERIOR APPROACH HEMI HIP ARTHROPLASTY;  Surgeon: Rod Can, MD;  Location: [facility];  Service: Orthopedics;  Laterality: Right;   COLONOSCOPY  08/09/2019   Procedure: COLONOSCOPY;  Surgeon: Lucilla Lame, MD;  Location: [facility] ENDOSCOPY;  Service: Endoscopy;;   dexa  07/2003   osteoporosis   ESOPHAGOGASTRODUODENOSCOPY N/A 03/21/2014   Procedure: ESOPHAGOGASTRODUODENOSCOPY (EGD);  Surgeon: Milus Banister, MD;  Location: [place];  Service: Endoscopy;  Laterality: N/A;   FLEXIBLE SIGMOIDOSCOPY N/A 08/08/2019   Procedure: FLEXIBLE SIGMOIDOSCOPY;  Surgeon: Jonathon Bellows, MD;  Location: [facility]  ENDOSCOPY;  Service: Gastroenterology;  Laterality: N/A;   FLEXIBLE SIGMOIDOSCOPY N/A 08/09/2019   Procedure: FLEXIBLE SIGMOIDOSCOPY;  Surgeon: Lucilla Lame, MD;  Location: [facility] ENDOSCOPY;  Service: Endoscopy;  Laterality: N/A;   HIP FRACTURE SURGERY     IR IVC FILTER PLMT / S&I /IMG GUID/MOD SED  07/06/2019   IVC FILTER INSERTION  07/2019   left foot brace     WRIST FRACTURE SURGERY  08/1999   R arm   WRIST FRACTURE SURGERY Left 02/12/2018    Prior to Admission medications   Medication Sig Start Date End Date Taking? Authorizing Provider  acetaminophen (TYLENOL) 500 MG tablet Take 500 mg by mouth every 6 (six) hours as needed.   Yes [provider]  Ascorbic Acid (VITAMIN C) 1000 MG tablet Take 1,000 mg by mouth daily.   Yes [provider]  Chlorpheniramine Maleate (ALLERGY PO) Take 1 tablet by mouth daily.    Yes [provider]  donepezil (ARICEPT) 10 MG tablet TAKE 1 TABLET BY MOUTH AT BEDTIME NEEDS APPT FOR REFILLS 12/05/20  Yes Tower, Marne A, MD  hydrochlorothiazide (HYDRODIURIL) 25 MG tablet TAKE 1 TABLET BY MOUTH EVERY DAY FOR BLOOD PRESSURE 04/24/21  Yes Tower, Marne A, MD  iron polysaccharides (NIFEREX) 150 MG capsule TAKE 1 CAPSULE (150 MG TOTAL) BY MOUTH EVERY OTHER DAY. 10/05/20  Yes Tower, Wynelle Fanny, MD  KLOR-CON M10 10 MEQ tablet TAKE 2 TABLETS BY MOUTH DAILY 04/24/21  Yes Tower, Marne A, MD  losartan (COZAAR) 100 MG tablet TAKE 1 TABLET BY MOUTH EVERY DAY 04/30/21  Yes Tower, Marne A, MD  Melatonin 10 MG TABS Take 10 mg by mouth at bedtime.   Yes [provider]  memantine (NAMENDA) 10 MG tablet TAKE 1 TABLET BY MOUTH TWICE A DAY 09/03/20  Yes Melvenia Beam, MD  metoprolol tartrate (LOPRESSOR) 25 MG tablet TAKE 1 TABLET BY MOUTH TWICE A DAY 04/30/21  Yes Tower, Wynelle Fanny, MD  Multiple Vitamins-Minerals ([employer] SPECTRAVITE PO) Take 1 capsule by mouth daily.   Yes [provider]  polyethylene glycol (MIRALAX / GLYCOLAX) 17 g packet Take 17  g by mouth daily. Patient taking differently: Take 17 g by mouth daily as needed. 08/09/19  Yes Wieting, Richard, MD  Probiotic Product (PROBIOTIC PO) Take 1 tablet by mouth every other day.    Yes [provider]  QUEtiapine (SEROQUEL) 25 MG tablet TAKE 1/2-1 TABLET BY MOUTH AS NEEDED IN THE EVENING FOR AGITATION/HALLUCINATIONS 04/30/21  Yes Tower, Wynelle Fanny, MD  vitamin B-12 (CYANOCOBALAMIN) 1000 MCG tablet Take 1,000 mcg by mouth daily.   Yes [provider]    Allergies Alendronate sodium, Amlodipine besylate, Calcitonin (salmon), Plavix [clopidogrel bisulfate], and Simvastatin  Family History  Problem Relation Age of Onset   Heart attack Father    Hypertension Father    Heart attack Mother    Throat cancer Brother    Breast cancer Daughter  Social History Social History   Tobacco Use   Smoking status: Never   Smokeless tobacco: Never  Vaping Use   Vaping Use: Never used  Substance Use Topics   Alcohol use: No    Alcohol/week: 0.0 standard drinks   Drug use: No    Review of Systems  Review of Systems  Unable to perform ROS: Mental status change     ____________________________________________   PHYSICAL EXAM:  VITAL SIGNS: ED Triage Vitals  Enc Vitals Group     BP      Pulse      Resp      Temp      Temp src      SpO2      Weight      Height      Head Circumference      Peak Flow      Pain Score      Pain Loc      Pain Edu?      Excl. in [place]?    Vitals:   06/19/21 2200 06/19/21 2230  BP: (!) 148/81 (!) 142/85  Pulse: (!) 103 (!) 102  Resp: (!) 22 18  Temp:    SpO2: 100% 99%   Physical Exam Vitals and nursing note reviewed.  Constitutional:      General: She is not in acute distress.    Appearance: She is well-developed.  HENT:     Head: Normocephalic and atraumatic.     Right Ear: External ear normal.     Left Ear: External ear normal.     Nose: Nose normal.     Mouth/Throat:     Mouth: Mucous membranes are dry.   Eyes:     Conjunctiva/sclera: Conjunctivae normal.  Cardiovascular:     Rate and Rhythm: Regular rhythm. Tachycardia present.     Heart sounds: No murmur heard. Pulmonary:     Effort: Pulmonary effort is normal. No respiratory distress.     Breath sounds: Normal breath sounds.  Abdominal:     Palpations: Abdomen is soft.     Tenderness: There is no abdominal tenderness.  Musculoskeletal:        General: No swelling.     Cervical back: Neck supple.  Skin:    General: Skin is warm and dry.     Capillary Refill: Capillary refill takes 2 to 3 seconds.  Neurological:     Mental Status: She is alert. She is disoriented and confused.  Psychiatric:        Mood and Affect: Mood normal.    Patient does not follow commands in extremities.  PERRLA.  EOMI.  Lungs are clear bilaterally and abdomen soft.  Back is unremarkable. ____________________________________________   LABS (all labs ordered are listed, but only abnormal results are displayed)  Labs Reviewed  RESP PANEL BY RT-PCR (FLU A&B, COVID) ARPGX2 - Abnormal; Notable for the following components:      Result Value   SARS Coronavirus 2 by RT PCR POSITIVE (*)    All other components within normal limits  URINALYSIS, COMPLETE (UACMP) WITH MICROSCOPIC - Abnormal; Notable for the following components:   Hgb urine dipstick TRACE (*)    Nitrite POSITIVE (*)    Leukocytes,Ua TRACE (*)    Bacteria, UA RARE (*)    All other components within normal limits  LACTIC ACID, PLASMA - Abnormal; Notable for the following components:   Lactic Acid, Venous 3.5 (*)    All other components within normal limits  LACTIC ACID, PLASMA - Abnormal; Notable for the following components:   Lactic Acid, Venous 2.0 (*)    All other components within normal limits  COMPREHENSIVE METABOLIC PANEL - Abnormal; Notable for the following components:   Glucose, Bld 112 (*)    Creatinine, Ser 1.16 (*)    GFR, Estimated 45 (*)    All other components within  normal limits  CBC WITH DIFFERENTIAL/PLATELET - Abnormal; Notable for the following components:   Platelets 137 (*)    Lymphs Abs 0.5 (*)    All other components within normal limits  CULTURE, BLOOD (ROUTINE X 2)  CULTURE, BLOOD (ROUTINE X 2)  URINE CULTURE  MAGNESIUM  PROCALCITONIN  PROTIME-INR  APTT  BASIC METABOLIC PANEL  MAGNESIUM  PHOSPHORUS  CBC WITH DIFFERENTIAL/PLATELET  PROTIME-INR  CORTISOL-AM, BLOOD   ____________________________________________  EKG  ECG shows sinus tachycardia with a ventricular rate of 109, left bundle branch block without evidence of other clear acute ischemia or significant arrhythmia. ____________________________________________  RADIOLOGY  ED MD interpretation:    CT head is evidence of generalized cerebral atrophy without evidence of ischemia, hemorrhage, mass-effect, edema or other clear acute intracranial process.  Chest x-ray shows no focal consolidation, effusion, edema, pneumothorax or any other clear acute thoracic process.  Official radiology report(s): CT HEAD WO CONTRAST (5MM)  Result Date: 06/19/2021 CLINICAL DATA:  Altered mental status. EXAM: CT HEAD WITHOUT CONTRAST TECHNIQUE: Contiguous axial images were obtained from the base of the skull through the vertex without intravenous contrast. COMPARISON:  January 18, 2019 FINDINGS: Brain: There is moderate severity cerebral atrophy with widening of the extra-axial spaces and ventricular dilatation. There are areas of decreased attenuation within the white matter tracts of the supratentorial brain, consistent with microvascular disease changes. Vascular: Marked severity arterial calcification is seen along the skull base. Skull: Normal. Negative for fracture or focal lesion. Sinuses/Orbits: No acute finding. Other: None. IMPRESSION: 1. Generalized cerebral atrophy. 2. No acute intracranial abnormality. Electronically Signed   By: Virgina Norfolk M.D.   On: 06/19/2021 21:37   DG Chest  Port 1 View  Result Date: 06/19/2021 CLINICAL DATA:  Possible sepsis EXAM: PORTABLE CHEST 1 VIEW COMPARISON:  12/07/2019 FINDINGS: Cardiac shadow is enlarged but stable. Tortuous thoracic aorta with calcifications is not seen. Lungs are clear bilaterally. No focal infiltrate or effusion is noted. No bony abnormality is seen. IMPRESSION: No active disease. Electronically Signed   By: [name] M.D.   On: 06/19/2021 21:12    ____________________________________________   PROCEDURES  Procedure(s) performed (including Critical Care):  .Critical Care Performed by: Lucrezia Starch, MD Authorized by: Lucrezia Starch, MD   Critical care provider statement:    Critical care time (minutes):  30   Critical care was necessary to treat or prevent imminent or life-threatening deterioration of the following conditions:  Sepsis   Critical care was time spent personally by me on the following activities:  Development of treatment plan with patient or surrogate, discussions with consultants, evaluation of patient's response to treatment, examination of patient, ordering and review of laboratory studies, ordering and review of radiographic studies, ordering and performing treatments and interventions, pulse oximetry, re-evaluation of patient's condition and review of old charts   ____________________________________________   INITIAL IMPRESSION / [place] / ED COURSE      Patient presents with posterior history exam for assessment of altered mental status over the last couple days in the setting of underlying dementia.  On arrival patient is tachycardic with  otherwise stable vital signs.  She is fairly confused and minimally participatory on my neurological exam.  There is no evidence of trauma on exam.  Differential considerations include encephalopathy from acute infectious process, metabolic derangement, CVA, endocrine derangement and polypharmacy.  ECG shows sinus tachycardia with a  ventricular rate of 109, left bundle branch block without evidence of other clear acute ischemia or significant arrhythmia.  CT head is evidence of generalized cerebral atrophy without evidence of ischemia, hemorrhage, mass-effect, edema or other clear acute intracranial process.  Chest x-ray shows no focal consolidation, effusion, edema, pneumothorax or any other clear acute thoracic process..  CMP shows no significant electrolyte or metabolic derangements.  CBC shows no leukocytosis or acute anemia.  Lactic acid is 2.  Magnesium is 1.9.  UA suggestive of cystitis with positive nitrites.  I suspect this is a significant contributing factor to patient's encephalopathy.  Patient appears euvolemic on reassessment.  We will give 1 L of fluids and start patient on Rocephin and obtain blood and urine cultures.  Immediately prior to admission patient is noted be slightly tachypneic.  Will initiate code sepsis and start patient on maintenance once as well will defer large-volume fluid boluses given patient appears euvolemic and reassess once patient is complete her initial bolus.  I will admit to medicine service for further evaluation and management.     ____________________________________________   FINAL CLINICAL IMPRESSION(S) / ED DIAGNOSES  Final diagnoses:  Acute cystitis without hematuria  Altered mental status, unspecified altered mental status type  Sepsis, due to unspecified organism, unspecified whether acute organ dysfunction present (HCC)  COVID    Medications  memantine (NAMENDA) tablet 10 mg (has no administration in time range)  donepezil (ARICEPT) tablet 10 mg (10 mg Oral Not Given 06/19/21 2312)  metoprolol tartrate (LOPRESSOR) tablet 25 mg (has no administration in time range)  melatonin tablet 10 mg (has no administration in time range)  acetaminophen (TYLENOL) tablet 650 mg (has no administration in time range)    Or  acetaminophen (TYLENOL) suppository 650 mg (has no  administration in time range)  ondansetron (ZOFRAN) tablet 4 mg (has no administration in time range)    Or  ondansetron (ZOFRAN) injection 4 mg (has no administration in time range)  cefTRIAXone (ROCEPHIN) 2 g in sodium chloride 0.9 % 100 mL IVPB (has no administration in time range)  enoxaparin (LOVENOX) injection 30 mg (has no administration in time range)  cefTRIAXone (ROCEPHIN) 1 g in sodium chloride 0.9 % 100 mL IVPB (0 g Intravenous Stopped 06/19/21 2312)     ED Discharge Orders     None        Note:  This document was prepared using Dragon voice recognition software and may include unintentional dictation errors.    Lucrezia Starch, MD 06/19/21 2235    Lucrezia Starch, MD 06/19/21 [phone]

## 2021-06-19 NOTE — ED Triage Notes (Signed)
Pt has dementia at baseline. Pt has been more confused over the past few days. Pt daughter states someone in the home has covid and also pt urine has a strong odor.

## 2021-06-19 NOTE — Sepsis Progress Note (Signed)
Following per sepsis protocol   

## 2021-06-19 NOTE — Progress Notes (Signed)
CODE SEPSIS - PHARMACY COMMUNICATION  **Broad Spectrum Antibiotics should be administered within 1 hour of Sepsis diagnosis**  Time Code Sepsis Called/Page Received: 2232  Antibiotics Ordered: ceftriaxone  Time of 1st antibiotic administration: [place] ,PharmD Clinical Pharmacist  06/19/2021  10:34 PM

## 2021-06-19 NOTE — Progress Notes (Signed)
PHARMACIST - PHYSICIAN COMMUNICATION  CONCERNING:  Enoxaparin (Lovenox) for DVT Prophylaxis    RECOMMENDATION: Patient was prescribed enoxaprin 40mg  q24 hours for VTE prophylaxis.   Filed Weights   06/19/21 2056  Weight: 68 kg (149 lb 14.6 oz)    Body mass index is 29.28 kg/m.  Estimated Creatinine Clearance: 28.3 mL/min (A) (by C-G formula based on SCr of 1.16 mg/dL (H)).   Patient is candidate for enoxaparin 30mg  every 24 hours based on CrCl <8ml/min or Weight <45kg  DESCRIPTION: Pharmacy has adjusted enoxaparin dose per [facility] policy.  Patient is now receiving enoxaparin 30 mg every 24 hours    Sherilyn Banker, PharmD Clinical Pharmacist  06/19/2021 10:51 PM

## 2021-06-20 ENCOUNTER — Telehealth: Payer: Self-pay

## 2021-06-20 DIAGNOSIS — Z808 Family history of malignant neoplasm of other organs or systems: Secondary | ICD-10-CM | POA: Diagnosis not present

## 2021-06-20 DIAGNOSIS — Z7401 Bed confinement status: Secondary | ICD-10-CM | POA: Diagnosis not present

## 2021-06-20 DIAGNOSIS — N3000 Acute cystitis without hematuria: Secondary | ICD-10-CM | POA: Diagnosis not present

## 2021-06-20 DIAGNOSIS — Z8673 Personal history of transient ischemic attack (TIA), and cerebral infarction without residual deficits: Secondary | ICD-10-CM | POA: Diagnosis not present

## 2021-06-20 DIAGNOSIS — Z888 Allergy status to other drugs, medicaments and biological substances status: Secondary | ICD-10-CM | POA: Diagnosis not present

## 2021-06-20 DIAGNOSIS — R410 Disorientation, unspecified: Secondary | ICD-10-CM

## 2021-06-20 DIAGNOSIS — D509 Iron deficiency anemia, unspecified: Secondary | ICD-10-CM | POA: Diagnosis present

## 2021-06-20 DIAGNOSIS — A419 Sepsis, unspecified organism: Secondary | ICD-10-CM

## 2021-06-20 DIAGNOSIS — R0602 Shortness of breath: Secondary | ICD-10-CM | POA: Diagnosis present

## 2021-06-20 DIAGNOSIS — Z86718 Personal history of other venous thrombosis and embolism: Secondary | ICD-10-CM | POA: Diagnosis not present

## 2021-06-20 DIAGNOSIS — F419 Anxiety disorder, unspecified: Secondary | ICD-10-CM | POA: Diagnosis not present

## 2021-06-20 DIAGNOSIS — E785 Hyperlipidemia, unspecified: Secondary | ICD-10-CM | POA: Diagnosis present

## 2021-06-20 DIAGNOSIS — Z79899 Other long term (current) drug therapy: Secondary | ICD-10-CM | POA: Diagnosis not present

## 2021-06-20 DIAGNOSIS — U071 COVID-19: Secondary | ICD-10-CM | POA: Diagnosis present

## 2021-06-20 DIAGNOSIS — Z96641 Presence of right artificial hip joint: Secondary | ICD-10-CM | POA: Diagnosis present

## 2021-06-20 DIAGNOSIS — M810 Age-related osteoporosis without current pathological fracture: Secondary | ICD-10-CM | POA: Diagnosis present

## 2021-06-20 DIAGNOSIS — Z803 Family history of malignant neoplasm of breast: Secondary | ICD-10-CM | POA: Diagnosis not present

## 2021-06-20 DIAGNOSIS — R4182 Altered mental status, unspecified: Secondary | ICD-10-CM | POA: Diagnosis not present

## 2021-06-20 DIAGNOSIS — Z66 Do not resuscitate: Secondary | ICD-10-CM | POA: Diagnosis present

## 2021-06-20 DIAGNOSIS — F03B4 Unspecified dementia, moderate, with anxiety: Secondary | ICD-10-CM | POA: Diagnosis present

## 2021-06-20 DIAGNOSIS — M1990 Unspecified osteoarthritis, unspecified site: Secondary | ICD-10-CM | POA: Diagnosis present

## 2021-06-20 DIAGNOSIS — Z8249 Family history of ischemic heart disease and other diseases of the circulatory system: Secondary | ICD-10-CM | POA: Diagnosis not present

## 2021-06-20 DIAGNOSIS — Z95828 Presence of other vascular implants and grafts: Secondary | ICD-10-CM | POA: Diagnosis not present

## 2021-06-20 DIAGNOSIS — Z28310 Unvaccinated for covid-19: Secondary | ICD-10-CM | POA: Diagnosis not present

## 2021-06-20 DIAGNOSIS — E876 Hypokalemia: Secondary | ICD-10-CM | POA: Diagnosis not present

## 2021-06-20 DIAGNOSIS — E559 Vitamin D deficiency, unspecified: Secondary | ICD-10-CM | POA: Diagnosis present

## 2021-06-20 DIAGNOSIS — I129 Hypertensive chronic kidney disease with stage 1 through stage 4 chronic kidney disease, or unspecified chronic kidney disease: Secondary | ICD-10-CM | POA: Diagnosis present

## 2021-06-20 DIAGNOSIS — I10 Essential (primary) hypertension: Secondary | ICD-10-CM

## 2021-06-20 DIAGNOSIS — N390 Urinary tract infection, site not specified: Secondary | ICD-10-CM

## 2021-06-20 DIAGNOSIS — A4151 Sepsis due to Escherichia coli [E. coli]: Secondary | ICD-10-CM | POA: Diagnosis present

## 2021-06-20 DIAGNOSIS — N1832 Chronic kidney disease, stage 3b: Secondary | ICD-10-CM | POA: Diagnosis present

## 2021-06-20 LAB — CBC WITH DIFFERENTIAL/PLATELET
Abs Immature Granulocytes: 0.02 10*3/uL (ref 0.00–0.07)
Basophils Absolute: 0 10*3/uL (ref 0.0–0.1)
Basophils Relative: 1 %
Eosinophils Absolute: 0 10*3/uL (ref 0.0–0.5)
Eosinophils Relative: 0 %
HCT: 35.3 % — ABNORMAL LOW (ref 36.0–46.0)
Hemoglobin: 11.6 g/dL — ABNORMAL LOW (ref 12.0–15.0)
Immature Granulocytes: 0 %
Lymphocytes Relative: 21 %
Lymphs Abs: 1 10*3/uL (ref 0.7–4.0)
MCH: 31 pg (ref 26.0–34.0)
MCHC: 32.9 g/dL (ref 30.0–36.0)
MCV: 94.4 fL (ref 80.0–100.0)
Monocytes Absolute: 0.9 10*3/uL (ref 0.1–1.0)
Monocytes Relative: 20 %
Neutro Abs: 2.7 10*3/uL (ref 1.7–7.7)
Neutrophils Relative %: 58 %
Platelets: 111 10*3/uL — ABNORMAL LOW (ref 150–400)
RBC: 3.74 MIL/uL — ABNORMAL LOW (ref 3.87–5.11)
RDW: 14.3 % (ref 11.5–15.5)
WBC: 4.8 10*3/uL (ref 4.0–10.5)
nRBC: 0 % (ref 0.0–0.2)

## 2021-06-20 LAB — BLOOD CULTURE ID PANEL (REFLEXED) - BCID2

## 2021-06-20 LAB — PROTIME-INR
INR: 1 (ref 0.8–1.2)
Prothrombin Time: 13.2 seconds (ref 11.4–15.2)

## 2021-06-20 LAB — MAGNESIUM: Magnesium: 1.8 mg/dL (ref 1.7–2.4)

## 2021-06-20 LAB — BASIC METABOLIC PANEL
Anion gap: 8 (ref 5–15)
BUN: 21 mg/dL (ref 8–23)
CO2: 24 mmol/L (ref 22–32)
Calcium: 9.2 mg/dL (ref 8.9–10.3)
Chloride: 106 mmol/L (ref 98–111)
Creatinine, Ser: 1.09 mg/dL — ABNORMAL HIGH (ref 0.44–1.00)
GFR, Estimated: 49 mL/min — ABNORMAL LOW (ref >60)
Glucose, Bld: 90 mg/dL (ref 70–99)
Potassium: 3.5 mmol/L (ref 3.5–5.1)
Sodium: 138 mmol/L (ref 135–145)

## 2021-06-20 LAB — CORTISOL-AM, BLOOD: Cortisol - AM: 11.4 ug/dL (ref 6.7–22.6)

## 2021-06-20 LAB — PHOSPHORUS: Phosphorus: 2.8 mg/dL (ref 2.5–4.6)

## 2021-06-20 MED ORDER — HYDROCOD POLST-CPM POLST ER 10-8 MG/5ML PO SUER
5.0000 mL | Freq: Every evening | ORAL | Status: DC | PRN
  Filled 2021-06-20: qty 5

## 2021-06-20 MED ORDER — SODIUM CHLORIDE 0.9 % IV SOLN
1.0000 g | Freq: Once | INTRAVENOUS | Status: AC
  Administered 2021-06-20: 01:00:00 1 g via INTRAVENOUS
  Filled 2021-06-20: qty 10

## 2021-06-20 MED ORDER — ENOXAPARIN SODIUM 40 MG/0.4ML IJ SOSY
40.0000 mg | PREFILLED_SYRINGE | Freq: Every day | INTRAMUSCULAR | Status: DC
  Administered 2021-06-21 – 2021-06-22 (×2): 40 mg via SUBCUTANEOUS
  Filled 2021-06-20 (×2): qty 0.4

## 2021-06-20 MED ORDER — ORAL CARE MOUTH RINSE
15.0000 mL | Freq: Two times a day (BID) | OROMUCOSAL | Status: DC
  Administered 2021-06-21 (×2): 15 mL via OROMUCOSAL

## 2021-06-20 MED ORDER — ENOXAPARIN SODIUM 30 MG/0.3ML IJ SOSY
30.0000 mg | PREFILLED_SYRINGE | Freq: Every day | INTRAMUSCULAR | Status: DC
  Administered 2021-06-20: 02:00:00 30 mg via SUBCUTANEOUS
  Filled 2021-06-20: qty 0.3

## 2021-06-20 NOTE — ED Notes (Signed)
Lab contacted to draw labs

## 2021-06-20 NOTE — Progress Notes (Signed)
PHARMACIST - PHYSICIAN COMMUNICATION  CONCERNING:  Enoxaparin (Lovenox) for DVT Prophylaxis    RECOMMENDATION: Patient was prescribed enoxaprin 30mg  q24 hours for VTE prophylaxis.   Filed Weights   06/19/21 2056  Weight: 68 kg (149 lb 14.6 oz)    Body mass index is 29.28 kg/m.  Estimated Creatinine Clearance: 30.1 mL/min (A) (by C-G formula based on SCr of 1.09 mg/dL (H)).   Renal fucntion improved since admission. Patient is candidate for enoxaparin 40mg  every 24 hours based on CrCl >14ml/min and  Weight >45kg  DESCRIPTION: Pharmacy has adjusted enoxaparin dose per [facility] policy.  Patient is now receiving enoxaparin 40 mg every 24 hours    Adean Milosevic Rodriguez-Guzman PharmD, BCPS 06/20/2021 2:51 PM

## 2021-06-20 NOTE — Progress Notes (Signed)
PHARMACY - PHYSICIAN COMMUNICATION CRITICAL VALUE ALERT - BLOOD CULTURE IDENTIFICATION (BCID)  Wendy Krueger is an 87 y.o. female who presented to [facility] on 06/19/2021 with a chief complaint of altered mental status  Assessment:  12/20 blood cultures currently with GPC in 1/2 sets (both bottles of the one set) and BCID detected Staphylococcus species (NOT S aureus of epidermidis).  She is on ceftriaxone for possible UTI.  She is COVID positive.   Name of physician (or Provider) Contacted: Dr Ouida Sills   Current antibiotics: Ceftriaxone  Changes to prescribed antibiotics recommended:  Recommendations accepted by provider - monitor on current antibiotic therapy as blood cx likely contaminant  Results for orders placed or performed during the hospital encounter of 06/19/21  Blood Culture ID Panel (Reflexed) (Collected: 06/19/2021  9:26 PM)  Result Value Ref Range   Enterococcus faecalis NOT DETECTED NOT DETECTED   Enterococcus Faecium NOT DETECTED NOT DETECTED   Listeria monocytogenes NOT DETECTED NOT DETECTED   Staphylococcus species DETECTED (A) NOT DETECTED   Staphylococcus aureus (BCID) NOT DETECTED NOT DETECTED   Staphylococcus epidermidis NOT DETECTED NOT DETECTED   Staphylococcus lugdunensis NOT DETECTED NOT DETECTED   Streptococcus species NOT DETECTED NOT DETECTED   Streptococcus agalactiae NOT DETECTED NOT DETECTED   Streptococcus pneumoniae NOT DETECTED NOT DETECTED   Streptococcus pyogenes NOT DETECTED NOT DETECTED   A.calcoaceticus-baumannii NOT DETECTED NOT DETECTED   Bacteroides fragilis NOT DETECTED NOT DETECTED   Enterobacterales NOT DETECTED NOT DETECTED   Enterobacter cloacae complex NOT DETECTED NOT DETECTED   Escherichia coli NOT DETECTED NOT DETECTED   Klebsiella aerogenes NOT DETECTED NOT DETECTED   Klebsiella oxytoca NOT DETECTED NOT DETECTED   Klebsiella pneumoniae NOT DETECTED NOT DETECTED   Proteus species NOT DETECTED NOT DETECTED   Salmonella  species NOT DETECTED NOT DETECTED   Serratia marcescens NOT DETECTED NOT DETECTED   Haemophilus influenzae NOT DETECTED NOT DETECTED   Neisseria meningitidis NOT DETECTED NOT DETECTED   Pseudomonas aeruginosa NOT DETECTED NOT DETECTED   Stenotrophomonas maltophilia NOT DETECTED NOT DETECTED   Candida albicans NOT DETECTED NOT DETECTED   Candida auris NOT DETECTED NOT DETECTED   Candida glabrata NOT DETECTED NOT DETECTED   Candida krusei NOT DETECTED NOT DETECTED   Candida parapsilosis NOT DETECTED NOT DETECTED   Candida tropicalis NOT DETECTED NOT DETECTED   Cryptococcus neoformans/gattii NOT DETECTED NOT DETECTED   Doreene Eland, PharmD, BCPS, BCIDP Work Cell: [phone] 06/20/2021 3:20 PM

## 2021-06-20 NOTE — ED Notes (Signed)
Pt linen and pads changed. Daughter at bedside.

## 2021-06-20 NOTE — Progress Notes (Signed)
PROGRESS NOTE  Wendy Krueger    DOB: 16-Nov-1929, 87 y.o.  ZES:[id number]  PCP: Abner Greenspan, MD   Code Status: DNR   DOA: 06/19/2021   LOS: 0  Brief Narrative of Current Hospitalization  Wendy Krueger is a 87 y.o. female with a PMH significant for hemorrhoids, dementia, osteoporosis, CKD3, HLD, IDA, R carotid aneurysm, CVA, HTN. They presented from home to the ED on 06/19/2021 with increased confusion from baseline dementia x1 days. In the ED, it was found that they had COVID+ and UTI. They were treated with IV antibiotics and supportive treatment.   Patient was admitted to medicine service for further workup and management of COVID 19 and generalized weakness as outlined in detail below.  06/20/21 -stable, improved  Assessment & Plan  Principal Problem:   Sepsis ([place]) Active Problems:   Vitamin D deficiency   Essential hypertension   Anxiety  Sepsis- criteria have resolved.  COVID-19- remains stable ORA - supportive care  UTI-  - continue IV Abx and fluids if not tolerating PO - strict I/Os  HTN- stable - continue home metoprolol  Dementia- patient has decreased involvement in her ADLs since on set of illness. Patient's daughter states that at baseline she is ambulatory and can feed herself but both daughter provide most of her care and she does not typically recognize them. Daughter desires for patient to be able to come back home when medically ready - continue home meds  DVT prophylaxis: enoxaparin (LOVENOX) injection 30 mg Start: 06/20/21 0045 Place TED hose Start: 06/19/21 2239   Diet:  Diet Orders (From admission, onward)     Start     Ordered   06/19/21 2240  Diet NPO time specified Except for: Sips with Meds, Ice Chips  Diet effective now       Question Answer Comment  Except for Sips with Meds   Except for Ice Chips      06/19/21 2240            Subjective 06/20/21    Pt unable to provide history.   Disposition Plan & Communication   Patient status: Emergency  Admitted From: Home Disposition: Home Anticipated discharge date: TBD  Family Communication: daughter at bedside  Consults, Procedures, Significant Events  Consultants:  none  Procedures/significant events:  None  Antimicrobials:  Anti-infectives (From admission, onward)    Start     Dose/Rate Route Frequency Ordered Stop   06/20/21 2200  cefTRIAXone (ROCEPHIN) 2 g in sodium chloride 0.9 % 100 mL IVPB        2 g 200 mL/hr over 30 Minutes Intravenous Every 24 hours 06/19/21 2240 06/26/21 2159   06/20/21 0015  cefTRIAXone (ROCEPHIN) 1 g in sodium chloride 0.9 % 100 mL IVPB       Note to Pharmacy: Patient met sepsis criteria and needs 2 g IV. EDP only ordered 1 g. Thank you.   1 g 200 mL/hr over 30 Minutes Intravenous  Once 06/20/21 0007 06/20/21 0131   06/19/21 2230  cefTRIAXone (ROCEPHIN) 1 g in sodium chloride 0.9 % 100 mL IVPB        1 g 200 mL/hr over 30 Minutes Intravenous  Once 06/19/21 2219 06/19/21 2312       Objective   Vitals:   06/20/21 0530 06/20/21 0600 06/20/21 0630 06/20/21 0700  BP: 137/69 139/76 119/66 (!) 142/60  Pulse: 69 66 84 69  Resp: 19 12 16 15   Temp:      TempSrc:  SpO2: 100% 99% 100% 97%  Weight:      Height:        Intake/Output Summary (Last 24 hours) at 06/20/2021 0757 Last data filed at 06/20/2021 0131 Gross per 24 hour  Intake 600 ml  Output --  Net 600 ml   Filed Weights   06/19/21 2056  Weight: 68 kg    Patient BMI: Body mass index is 29.28 kg/m.   Physical Exam:  General: awake, alert, NAD HEENT: atraumatic, clear conjunctiva, anicteric sclera, MMM Respiratory: normal respiratory effort. Clear breath sounds Cardiovascular: uick capillary refill  Nervous: alert but does not answer questions appropriately or recognize daughter which is her baseline. She is oriented to self only. Extremities: moves all equally, no edema, normal tone Skin: dry, intact, normal temperature, normal color. No  rashes, lesions or ulcers on exposed skin  Labs   I have personally reviewed following labs and imaging studies Admission on 06/19/2021  Component Date Value Ref Range Status   SARS Coronavirus 2 by RT PCR 06/19/2021 POSITIVE (A)  NEGATIVE Final   Influenza A by PCR 06/19/2021 NEGATIVE  NEGATIVE Final   Influenza B by PCR 06/19/2021 NEGATIVE  NEGATIVE Final   Color, Urine 06/19/2021 YELLOW  YELLOW Final   APPearance 06/19/2021 CLEAR  CLEAR Final   Specific Gravity, Urine 06/19/2021 1.020  1.005 - 1.030 Final   pH 06/19/2021 6.0  5.0 - 8.0 Final   Glucose, UA 06/19/2021 NEGATIVE  NEGATIVE mg/dL Final   Hgb urine dipstick 06/19/2021 TRACE (A)  NEGATIVE Final   Bilirubin Urine 06/19/2021 NEGATIVE  NEGATIVE Final   Ketones, ur 06/19/2021 NEGATIVE  NEGATIVE mg/dL Final   Protein, ur 06/19/2021 NEGATIVE  NEGATIVE mg/dL Final   Nitrite 06/19/2021 POSITIVE (A)  NEGATIVE Final   Leukocytes,Ua 06/19/2021 TRACE (A)  NEGATIVE Final   Squamous Epithelial / LPF 06/19/2021 NONE SEEN  0 - 5 Final   WBC, UA 06/19/2021 11-20  0 - 5 WBC/hpf Final   RBC / HPF 06/19/2021 0-5  0 - 5 RBC/hpf Final   Bacteria, UA 06/19/2021 RARE (A)  NONE SEEN Final   Mucus 06/19/2021 PRESENT   Final   Hyaline Casts, UA 06/19/2021 PRESENT   Final   Lactic Acid, Venous 06/19/2021 3.5 (HH)  0.5 - 1.9 mmol/L Final   Lactic Acid, Venous 06/19/2021 2.0 (HH)  0.5 - 1.9 mmol/L Final   Sodium 06/19/2021 139  135 - 145 mmol/L Final   Potassium 06/19/2021 3.6  3.5 - 5.1 mmol/L Final   Chloride 06/19/2021 106  98 - 111 mmol/L Final   CO2 06/19/2021 26  22 - 32 mmol/L Final   Glucose, Bld 06/19/2021 112 (H)  70 - 99 mg/dL Final   BUN 06/19/2021 23  8 - 23 mg/dL Final   Creatinine, Ser 06/19/2021 1.16 (H)  0.44 - 1.00 mg/dL Final   Calcium 06/19/2021 9.6  8.9 - 10.3 mg/dL Final   Total Protein 06/19/2021 7.2  6.5 - 8.1 g/dL Final   Albumin 06/19/2021 3.8  3.5 - 5.0 g/dL Final   AST 06/19/2021 23  15 - 41 U/L Final   ALT 06/19/2021  10  0 - 44 U/L Final   Alkaline Phosphatase 06/19/2021 68  38 - 126 U/L Final   Total Bilirubin 06/19/2021 0.7  0.3 - 1.2 mg/dL Final   GFR, Estimated 06/19/2021 45 (L)  >60 mL/min Final   Anion gap 06/19/2021 7  5 - 15 Final   WBC 06/19/2021 4.7  4.0 - 10.5  K/uL Final   RBC 06/19/2021 4.19  3.87 - 5.11 MIL/uL Final   Hemoglobin 06/19/2021 13.0  12.0 - 15.0 g/dL Final   HCT 06/19/2021 39.5  36.0 - 46.0 % Final   MCV 06/19/2021 94.3  80.0 - 100.0 fL Final   MCH 06/19/2021 31.0  26.0 - 34.0 pg Final   MCHC 06/19/2021 32.9  30.0 - 36.0 g/dL Final   RDW 06/19/2021 14.1  11.5 - 15.5 % Final   Platelets 06/19/2021 137 (L)  150 - 400 K/uL Final   nRBC 06/19/2021 0.0  0.0 - 0.2 % Final   Neutrophils Relative % 06/19/2021 78  % Final   Neutro Abs 06/19/2021 3.7  1.7 - 7.7 K/uL Final   Lymphocytes Relative 06/19/2021 11  % Final   Lymphs Abs 06/19/2021 0.5 (L)  0.7 - 4.0 K/uL Final   Monocytes Relative 06/19/2021 10  % Final   Monocytes Absolute 06/19/2021 0.5  0.1 - 1.0 K/uL Final   Eosinophils Relative 06/19/2021 0  % Final   Eosinophils Absolute 06/19/2021 0.0  0.0 - 0.5 K/uL Final   Basophils Relative 06/19/2021 0  % Final   Basophils Absolute 06/19/2021 0.0  0.0 - 0.1 K/uL Final   Immature Granulocytes 06/19/2021 1  % Final   Abs Immature Granulocytes 06/19/2021 0.03  0.00 - 0.07 K/uL Final   Magnesium 06/19/2021 1.9  1.7 - 2.4 mg/dL Final   Procalcitonin 06/19/2021 <0.10  ng/mL Corrected   Prothrombin Time 06/19/2021 13.3  11.4 - 15.2 seconds Final   INR 06/19/2021 1.0  0.8 - 1.2 Final   aPTT 06/19/2021 29  24 - 36 seconds Final   Sodium 06/20/2021 138  135 - 145 mmol/L Final   Potassium 06/20/2021 3.5  3.5 - 5.1 mmol/L Final   Chloride 06/20/2021 106  98 - 111 mmol/L Final   CO2 06/20/2021 24  22 - 32 mmol/L Final   Glucose, Bld 06/20/2021 90  70 - 99 mg/dL Final   BUN 06/20/2021 21  8 - 23 mg/dL Final   Creatinine, Ser 06/20/2021 1.09 (H)  0.44 - 1.00 mg/dL Final   Calcium  06/20/2021 9.2  8.9 - 10.3 mg/dL Final   GFR, Estimated 06/20/2021 49 (L)  >60 mL/min Final   Anion gap 06/20/2021 8  5 - 15 Final   Magnesium 06/20/2021 1.8  1.7 - 2.4 mg/dL Final   Phosphorus 06/20/2021 2.8  2.5 - 4.6 mg/dL Final   Prothrombin Time 06/20/2021 13.2  11.4 - 15.2 seconds Final   INR 06/20/2021 1.0  0.8 - 1.2 Final    Imaging Studies  CT HEAD WO CONTRAST (5MM)  Result Date: 06/19/2021 CLINICAL DATA:  Altered mental status. EXAM: CT HEAD WITHOUT CONTRAST TECHNIQUE: Contiguous axial images were obtained from the base of the skull through the vertex without intravenous contrast. COMPARISON:  January 18, 2019 FINDINGS: Brain: There is moderate severity cerebral atrophy with widening of the extra-axial spaces and ventricular dilatation. There are areas of decreased attenuation within the white matter tracts of the supratentorial brain, consistent with microvascular disease changes. Vascular: Marked severity arterial calcification is seen along the skull base. Skull: Normal. Negative for fracture or focal lesion. Sinuses/Orbits: No acute finding. Other: None. IMPRESSION: 1. Generalized cerebral atrophy. 2. No acute intracranial abnormality. Electronically Signed   By: Virgina Norfolk M.D.   On: 06/19/2021 21:37   DG Chest Port 1 View  Result Date: 06/19/2021 CLINICAL DATA:  Possible sepsis EXAM: PORTABLE CHEST 1 VIEW COMPARISON:  12/07/2019  FINDINGS: Cardiac shadow is enlarged but stable. Tortuous thoracic aorta with calcifications is not seen. Lungs are clear bilaterally. No focal infiltrate or effusion is noted. No bony abnormality is seen. IMPRESSION: No active disease. Electronically Signed   By: [name] M.D.   On: 06/19/2021 21:12    Medications   Scheduled Meds:  donepezil  10 mg Oral QHS   enoxaparin (LOVENOX) injection  30 mg Subcutaneous Q1200   memantine  10 mg Oral BID   metoprolol tartrate  25 mg Oral BID   No recently discontinued medications to reconcile  LOS: 0  days   Time spent: >64mn  Alexius Ellington L Hector Venne, DO [facility] 06/20/2021, 7:57 AM   Available by Epic secure chat 7AM-7PM. If 7PM-7AM, please contact night-coverage Refer to [email or web address] to contact the [facility]ending or Consulting provider for this pt

## 2021-06-20 NOTE — ED Notes (Signed)
Amber RN aware of assigned bed 

## 2021-06-20 NOTE — ED Notes (Signed)
Patient cleaned and brief changed. Purwick placed on patient

## 2021-06-20 NOTE — Telephone Encounter (Signed)
Per chart review tab pt was at [facility] ED on 06/19/21 and was admitted. Sending note to Dr Glori Bickers.

## 2021-06-20 NOTE — Telephone Encounter (Signed)
Wendy Krueger - Client TELEPHONE ADVICE RECORD Access[employer]urse Patient Name: Wendy Krueger Gender: Female DOB: 08/29/1929 Age: 87 Y 9 M 26 D Return Phone Number: [phone] (Primary) Address: [place]  [address] Client [facility] Krueger - Client Client Site [place] Provider [place], Roque Lias - MD Contact Type Call Who Is Calling Patient / Member / Family / Caregiver Call Type Triage / Clinical Caller Name brenda mcmath Relationship To Patient Daughter Return Phone Number Please choose phone number Chief Complaint CONFUSION - new onset Reason for Call Symptomatic / Request for [place] states her mother all day and her urine smells strong and not herself. No fever. Caller wandering if the mom has a urinary tract infection. Location confirmed. Mom is more confused today and has dementia. Translation No Nurse Assessment Nurse: Clydene Laming, RN, Yancey Flemings Date/Time Eilene Ghazi Time): 06/19/2021 7:13:34 PM Confirm and document reason for call. If symptomatic, describe symptoms. ---Caller states her mother's urine smells strong and not herself. No fever. Caller wandering if the mom has a urinary tract infection. Location confirmed. Mom is more confused today and has dementia. Does the patient have any new or worsening symptoms? ---Yes Will a triage be completed? ---Yes Related visit to physician within the last 2 weeks? ---No Does the PT have any chronic conditions? (i.e. diabetes, asthma, this includes High risk factors for pregnancy, etc.) ---Yes List chronic conditions. ---dementia, htn Is this a behavioral health or substance abuse call? ---No Guidelines Guideline Title Affirmed Question Affirmed Notes Nurse Date/Time (Eastern Time) Urinary Symptoms Shock suspected (e.g., cold/pale/ clammy skin, too weak to stand, low BP, rapid pulse) Clydene Laming,  RN, Yancey Flemings 06/19/2021 7:16:59 PM PLEASE NOTE: All timestamps contained within this report are represented as [place] Standard Time. CONFIDENTIALTY NOTICE: This fax transmission is intended only for the addressee. It contains information that is legally privileged, confidential or otherwise protected from use or disclosure. If you are not the intended recipient, you are strictly prohibited from reviewing, disclosing, copying using or disseminating any of this information or taking any action in reliance on or regarding this information. If you have received this fax in error, please notify us immediately by telephone so that we can arrange for its return to [place]. Phone: [phone], Toll-Free: [phone], Fax: [phone] Page: 2 of 2 Call Id: [id number] [place]. Time Eilene Ghazi Time) Disposition Final User 06/19/2021 7:11:57 PM Send to Urgent Leland Her 06/19/2021 7:27:58 PM 911 Outcome Documentation Clydene Laming, RN, Yancey Flemings Reason: Refused to call 06/19/2021 7:19:14 PM Call EMS 911 Now Yes Clydene Laming, RN, [place] Disagree/Comply Disagree Caller Understands Yes PreDisposition InappropriateToAsk Care Advice Given Per Guideline CALL EMS 911 NOW: * Immediate medical attention is needed. You need to hang up and call 911 (or an ambulance). * Lie down with feet elevated. * Reason: Treatment for shock. CARE ADVICE given per Urinary Symptoms (Adult) guideline. Comments User: Wendy Renshaw, RN Date/Time Eilene Ghazi Time): 06/19/2021 7:16:05 PM Has been having urinary accidents today, which is she usually doesn't do. And also not wanting to walk

## 2021-06-20 NOTE — H&P (Addendum)
History and Physical   Wendy Krueger EXB:[id number] DOB: [birth year] DOA: 06/19/2021  PCP: Abner Greenspan, MD  Patient coming from: home   I have personally briefly reviewed patient's old medical records in Wendy Krueger.  Chief Concern: Altered mental status  HPI: Wendy Krueger is a 87 y.o. female with medical history significant for history of bleeding hemorrhoids, advanced dementia, osteoporosis, CKD 3B, vitamin D deficiency, hyperlipidemia, iron deficiency anemia, right carotid aneurysm, history of chronic ischemic stroke, hypertension, who presents emergency department for chief concerns of altered mentation.  Per daughter, patient has had increased confusion that started on Monday, 06/18/2021, early morning.   Today, she had increased weakness and not able to get up out of bed.  Today, she required two people to help her up.  Daughter also endorses a dry cough that started today.  Daughter also reports her urine produce a strong odor.  Daughter denies any diarrhea.  Daughter denies any vomiting.  At baseline, patient can feed herself, walk with a walker and belt.  One of her daughters who takes care of her regularly, recently tested positive for COVID-19.  At bedside patient is awake. She mumbles English words, however there is no reason neurologic.  She does not answer questions when asked.  She is not able to follow commands, and is not able to tell me her name.  She just states, 'my name? It's a good name.'  She is not able to identify her daughter at bedside.  She is not able to tell me her age.  Social history: She lives at home and she has two daughters who take turn caring for her 24 hours per day. She denies history of smoking, etoh, recreational drug use  Vaccination history: She is not vaccinated for covid   ROS: Unable to complete due to dementia   ED Course: Discussed with emergency medicine provider, patient requiring hospitalization for chief concerns of  altered mental status and meeting sepsis criteria.  Vitals in the emergency department showed temperature of 98.4, respiration rate of 19, heart rate of 109, initial blood pressure 117/83, and improved to 148/81, SPO2 of 100% on room air.  Labs in the emergency department was remarkable for serum sodium 139, potassium 3.6, chloride 106, bicarb 26, BUN of 23, serum creatinine of 1.16, nonfasting blood glucose 112, GFR 45.  WBC was 4.7, hemoglobin 13, platelets of 137.  Lactic acid was elevated at 2.0 and increased to 3.5. Patient tested positive for COVID-19. UA was positive for trace leukocytes and nitrates.  ED provider ordered ceftriaxone 1 g IV.  Blood cultures x2 and urine culture have been collected.  Assessment/Plan  Principal Problem:   Sepsis ([place]) Active Problems:   Vitamin D deficiency   Essential hypertension   Anxiety   # Patient met sepsis criteria - Multiple source is a possibility including urinary and or COVID - Patient received ceftriaxone 1 g IV per EDP - Given that patient met sepsis criteria, I ordered an additional dose of 1 g IV - Ceftriaxone 2 g IV ordered starting for 12/21 - LR 1 L bolus ordered - Patient does not require sepsis bolus at this time and given that patient tested positive for COVID-19, we will use judicious clinical judgment for fluid hydration  # COVID positivity - Patient is not requiring oxygen supplementation, therefore remdesivir has been deferred at this time - Tussionex oral, nightly as needed for cough, 3 days ordered  # Hypertension-resumed home metoprolol tartrate 25 mg  p.o. twice daily  # Dementia-resume memantine 10 mg twice daily, donepezil 10 mg nightly  # Insomnia-resumed home melatonin 10 mg as needed nightly for insomnia  # History hemorrhoid bleeding-daughter initially declined enoxaparin for DVT prophylaxis due to patient's history of pulsing hemorrhoid bleeding that was difficult to stop. - I explained extensively  with daughter that patient has tested positive for COVID and given that she is more bedbound, patient will have increased risk of developing lower extremity DVTs that will go to her lungs and caused a pulmonary embolism.  The risk of pulmonary embolism including heart attack and death.  The risk of lower extremity DVT includes loss of limb/amputation. - Daughter listened and states that it is okay to get Lovenox for DVT prophylaxis  Nursing staff has requested a speech evaluation for swallow study  Chart reviewed.   DVT prophylaxis: Enoxaparin 30 mg subcutaneous Code Status: DNR/DNI with the most form Diet: N.p.o. Family Communication: Updated [name] Disposition Plan: Pending clinical course Consults called: None at this time Admission status: Telemetry medical  Past Medical History:  Diagnosis Date   Asymptomatic gallstones    BRBPR (bright red blood per rectum)    Cataract 04/13/2018   bilateral eyes   Cholelithiasis    Clot    RLE blood clots; s/p IVC filter   Colitis - presumed infectious origin    one ER visit    Dementia ([place])    Duodenitis    Fall    Gastritis    GI bleed    Hemorrhoid    bleeding   Hiatal hernia    Hyperlipidemia    Hypertension    Osteoarthritis    Osteoporosis    Stroke ([place])    Syncope    Past Surgical History:  Procedure Laterality Date   ABDOMINAL HYSTERECTOMY     BSO- fibroids   ABI's     normal   ANTERIOR APPROACH HEMI HIP ARTHROPLASTY Right 12/08/2019   Procedure: ANTERIOR APPROACH HEMI HIP ARTHROPLASTY;  Surgeon: Rod Can, MD;  Location: [facility];  Service: Orthopedics;  Laterality: Right;   COLONOSCOPY  08/09/2019   Procedure: COLONOSCOPY;  Surgeon: Lucilla Lame, MD;  Location[facility] ENDOSCOPY;  Service: Endoscopy;;   dexa  07/2003   osteoporosis   ESOPHAGOGASTRODUODENOSCOPY N/A 03/21/2014   Procedure: ESOPHAGOGASTRODUODENOSCOPY (EGD);  Surgeon: Milus Banister, MD;  Location: [facility];  Service: Endoscopy;   Laterality: N/A;   FLEXIBLE SIGMOIDOSCOPY N/A 08/08/2019   Procedure: FLEXIBLE SIGMOIDOSCOPY;  Surgeon: Jonathon Bellows, MD;  Location[facility] END[facility]COPY;  Service: Gastroenterology;  Laterality: N/A;   FLEXIBLE SIGMOIDOSCOPY N/A 08/09/2019   Procedure: FLEXIBLE SIGMOIDOSCOPY;  Surgeon: Lucilla Lame, MD;  Location: [facility] ENDOSCOPY;  Service: Endoscopy;  Laterality: N/A;   HIP FRACTURE SURGERY     IR IVC FILTER PLMT / S&I /IMG GUID/MOD SED  07/06/2019   IVC FILTER INSERTION  07/2019   left foot brace     WRIST FRACTURE SURGERY  08/1999   R arm   WRIST FRACTURE SURGERY Left 02/12/2018   Social History:  reports that she has never smoked. She has never used smokeless tobacco. She reports that she does not drink alcohol and does not use drugs.  Allergies  Allergen Reactions   Alendronate Sodium Other (See Comments)    Leg pain    Amlodipine Besylate Hives   Calcitonin ([place]) Other (See Comments)     headache/ head pressure   Plavix [Clopidogrel Bisulfate] Other (See Comments)    drowsy  Simvastatin Other (See Comments)    Leg pain    Family History  Problem Relation Age of Onset   Heart attack Father    Hypertension Father    Heart attack Mother    Throat cancer Brother    Breast cancer Daughter    Family history: Family history reviewed and not pertinent.  Prior to Admission medications   Medication Sig Start Date End Date Taking? Authorizing Provider  acetaminophen (TYLENOL) 500 MG tablet Take 500 mg by mouth every 6 (six) hours as needed.   Yes [provider]  Ascorbic Acid (VITAMIN C) 1000 MG tablet Take 1,000 mg by mouth daily.   Yes [provider]  Chlorpheniramine Maleate (ALLERGY PO) Take 1 tablet by mouth daily.    Yes [provider]  donepezil (ARICEPT) 10 MG tablet TAKE 1 TABLET BY MOUTH AT BEDTIME NEEDS APPT FOR REFILLS 12/05/20  Yes Tower, Marne A, MD  hydrochlorothiazide (HYDRODIURIL) 25 MG tablet TAKE 1 TABLET BY MOUTH EVERY DAY FOR  BLOOD PRESSURE 04/24/21  Yes Tower, Marne A, MD  iron polysaccharides (NIFEREX) 150 MG capsule TAKE 1 CAPSULE (150 MG TOTAL) BY MOUTH EVERY OTHER DAY. 10/05/20  Yes Tower, Wynelle Fanny, MD  KLOR-CON M10 10 MEQ tablet TAKE 2 TABLETS BY MOUTH DAILY 04/24/21  Yes Tower, Marne A, MD  losartan (COZAAR) 100 MG tablet TAKE 1 TABLET BY MOUTH EVERY DAY 04/30/21  Yes Tower, Marne A, MD  Melatonin 10 MG TABS Take 10 mg by mouth at bedtime.   Yes [provider]  memantine (NAMENDA) 10 MG tablet TAKE 1 TABLET BY MOUTH TWICE A DAY 09/03/20  Yes Melvenia Beam, MD  metoprolol tartrate (LOPRESSOR) 25 MG tablet TAKE 1 TABLET BY MOUTH TWICE A DAY 04/30/21  Yes Tower, Wynelle Fanny, MD  Multiple Vitamins-Minerals ([employer] SPECTRAVITE PO) Take 1 capsule by mouth daily.   Yes [provider]  polyethylene glycol (MIRALAX / GLYCOLAX) 17 g packet Take 17 g by mouth daily. Patient taking differently: Take 17 g by mouth daily as needed. 08/09/19  Yes Wieting, Richard, MD  Probiotic Product (PROBIOTIC PO) Take 1 tablet by mouth every other day.    Yes [provider]  QUEtiapine (SEROQUEL) 25 MG tablet TAKE 1/2-1 TABLET BY MOUTH AS NEEDED IN THE EVENING FOR AGITATION/HALLUCINATIONS 04/30/21  Yes Tower, Wynelle Fanny, MD  vitamin B-12 (CYANOCOBALAMIN) 1000 MCG tablet Take 1,000 mcg by mouth daily.   Yes [provider]   Physical Exam: Vitals:   06/19/21 2050 06/19/21 2056 06/19/21 2200 06/19/21 2230  BP: 117/83  (!) 148/81 (!) 142/85  Pulse: (!) 109  (!) 103 (!) 102  Resp: 19  (!) 22 18  Temp: 98.4 F (36.9 C)     TempSrc: Oral     SpO2: 100%  100% 99%  Weight:  68 kg    Height:  5' (1.524 m)     Constitutional: appears age-appropriate, frail, NAD, calm, comfortable Eyes: PERRL, lids and conjunctivae normal ENMT: Mucous membranes are moist. Posterior pharynx clear of any exudate or lesions. Age-appropriate dentition. Hearing appropriate Neck: normal, supple, no masses, no thyromegaly Respiratory:  clear to auscultation bilaterally, no wheezing, no crackles. Normal respiratory effort. No accessory muscle use.  Cardiovascular: Regular rate and rhythm, no murmurs / rubs / gallops. No extremity edema. 2+ pedal pulses. No carotid bruits.  Abdomen: no tenderness, no masses palpated, no hepatosplenomegaly. Bowel sounds positive.  Musculoskeletal: no clubbing / cyanosis. No joint deformity upper and lower extremities.  Good ROM, no contractures, no atrophy. Normal muscle tone.  Skin: no rashes, lesions, ulcers. No induration Neurologic: Sensation intact. Strength 5/5 in all 4.  Psychiatric: Lacks judgment and insight. Alert.  Not oriented to self, age, current location, current calendar year. Normal mood.   EKG: independently reviewed, showing sinus tachycardia with rate of 109, QTc 447  Chest x-ray on Admission: I personally reviewed and I agree with radiologist reading as below.  CT HEAD WO CONTRAST (5MM)  Result Date: 06/19/2021 CLINICAL DATA:  Altered mental status. EXAM: CT HEAD WITHOUT CONTRAST TECHNIQUE: Contiguous axial images were obtained from the base of the skull through the vertex without intravenous contrast. COMPARISON:  January 18, 2019 FINDINGS: Brain: There is moderate severity cerebral atrophy with widening of the extra-axial spaces and ventricular dilatation. There are areas of decreased attenuation within the white matter tracts of the supratentorial brain, consistent with microvascular disease changes. Vascular: Marked severity arterial calcification is seen along the skull base. Skull: Normal. Negative for fracture or focal lesion. Sinuses/Orbits: No acute finding. Other: None. IMPRESSION: 1. Generalized cerebral atrophy. 2. No acute intracranial abnormality. Electronically Signed   By: Virgina Norfolk M.D.   On: 06/19/2021 21:37   DG Chest Port 1 View  Result Date: 06/19/2021 CLINICAL DATA:  Possible sepsis EXAM: PORTABLE CHEST 1 VIEW COMPARISON:  12/07/2019 FINDINGS: Cardiac  shadow is enlarged but stable. Tortuous thoracic aorta with calcifications is not seen. Lungs are clear bilaterally. No focal infiltrate or effusion is noted. No bony abnormality is seen. IMPRESSION: No active disease. Electronically Signed   By: [name] M.D.   On: 06/19/2021 21:12    Labs on Admission: I have personally reviewed following labs  CBC: Recent Labs  Lab 06/19/21 2126  WBC 4.7  NEUTROABS 3.7  HGB 13.0  HCT 39.5  MCV 94.3  PLT 768*   Basic Metabolic Panel: Recent Labs  Lab 06/19/21 2126  NA 139  K 3.6  CL 106  CO2 26  GLUCOSE 112*  BUN 23  CREATININE 1.16*  CALCIUM 9.6  MG 1.9   GFR: Estimated Creatinine Clearance: 28.3 mL/min (A) (by C-G formula based on SCr of 1.16 mg/dL (H)).  Liver Function Tests: Recent Labs  Lab 06/19/21 2126  AST 23  ALT 10  ALKPHOS 68  BILITOT 0.7  PROT 7.2  ALBUMIN 3.8   Coagulation Profile: Recent Labs  Lab 06/19/21 2324  INR 1.0   Urine analysis:    Component Value Date/Time   COLORURINE YELLOW 06/19/2021 2145   APPEARANCEUR CLEAR 06/19/2021 2145   LABSPEC 1.020 06/19/2021 2145   PHURINE 6.0 06/19/2021 2145   GLUCOSEU NEGATIVE 06/19/2021 2145   HGBUR TRACE (A) 06/19/2021 2145   BILIRUBINUR NEGATIVE 06/19/2021 2145   BILIRUBINUR Negative 02/18/2019 [place] 06/19/2021 2145   PROTEINUR NEGATIVE 06/19/2021 2145   UROBILINOGEN 1.0 02/18/2019 1658   UROBILINOGEN 0.2 03/20/2014 0753   NITRITE POSITIVE (A) 06/19/2021 2145   LEUKOCYTESUR TRACE (A) 06/19/2021 2145   Dr. Tobie Poet [facility]  If 7PM-7AM, please contact overnight-coverage provider If 7AM-7PM, please contact day coverage provider [email or web address]  06/20/2021, 12:01 AM

## 2021-06-20 NOTE — ED Notes (Addendum)
Pt daughter asked for notification when pt is moved to another room or for any updates on her mothers care.   [name] [phone]

## 2021-06-21 LAB — CBC
HCT: 35.7 % — ABNORMAL LOW (ref 36.0–46.0)
Hemoglobin: 11.8 g/dL — ABNORMAL LOW (ref 12.0–15.0)
MCH: 30.6 pg (ref 26.0–34.0)
MCHC: 33.1 g/dL (ref 30.0–36.0)
MCV: 92.5 fL (ref 80.0–100.0)
Platelets: 117 10*3/uL — ABNORMAL LOW (ref 150–400)
RBC: 3.86 MIL/uL — ABNORMAL LOW (ref 3.87–5.11)
RDW: 14.1 % (ref 11.5–15.5)
WBC: 3.5 10*3/uL — ABNORMAL LOW (ref 4.0–10.5)
nRBC: 0 % (ref 0.0–0.2)

## 2021-06-21 LAB — BASIC METABOLIC PANEL
Anion gap: 6 (ref 5–15)
BUN: 22 mg/dL (ref 8–23)
CO2: 25 mmol/L (ref 22–32)
Calcium: 8.6 mg/dL — ABNORMAL LOW (ref 8.9–10.3)
Chloride: 105 mmol/L (ref 98–111)
Creatinine, Ser: 0.97 mg/dL (ref 0.44–1.00)
GFR, Estimated: 56 mL/min — ABNORMAL LOW (ref >60)
Glucose, Bld: 91 mg/dL (ref 70–99)
Potassium: 3.1 mmol/L — ABNORMAL LOW (ref 3.5–5.1)
Sodium: 136 mmol/L (ref 135–145)

## 2021-06-21 LAB — MAGNESIUM: Magnesium: 1.7 mg/dL (ref 1.7–2.4)

## 2021-06-21 MED ORDER — POTASSIUM CHLORIDE CRYS ER 20 MEQ PO TBCR
40.0000 meq | EXTENDED_RELEASE_TABLET | Freq: Two times a day (BID) | ORAL | Status: AC
  Administered 2021-06-21 (×2): 40 meq via ORAL
  Filled 2021-06-21 (×2): qty 2

## 2021-06-21 NOTE — Progress Notes (Signed)
PROGRESS NOTE  [name]    DOB: Dec 18, 1929, 87 y.o.  LTJ:[id number]  PCP: Abner Greenspan, MD   Code Status: DNR   DOA: 06/19/2021   LOS: 1  Brief Narrative of Current Hospitalization  Wendy Krueger is a 87 y.o. female with a PMH significant for hemorrhoids, dementia, osteoporosis, CKD3, HLD, IDA, R carotid aneurysm, CVA, HTN. They presented from home to the ED on 06/19/2021 with increased confusion from baseline dementia x1 days. In the ED, it was found that they had COVID+ and UTI. They were treated with IV antibiotics and supportive treatment.   Patient was admitted to medicine service for further workup and management of COVID 19 and generalized weakness as outlined in detail below.  06/21/21 -stable, improved  Assessment & Plan  Principal Problem:   Sepsis ([place]) Active Problems:   Vitamin D deficiency   Essential hypertension   Anxiety   SOB (shortness of breath)  Sepsis- criteria have resolved. Urine cultures positive GNR, sensitivities pending. Blood cultures positive for staph hominis in only one source, likely contaminant. Otherwise NGTD. Significant physical deconditioning remains.  - PT/OT- patient is ambulatory and can feed herself at baseline.   COVID-19- remains stable ORA. Febrile overnight to 101.2. has resolved today.   - supportive care  Hypokalemia- K+ 3.1 today.  - add on magnesium - replete PRN  UTI- patient is much more alert and responsive today. Able to follow some commands. - continue IV Abx and fluids if not tolerating PO - strict I/Os  HTN- stable - continue home metoprolol  Dementia- patient has decreased involvement in her ADLs since on set of illness. Patient's daughter states that at baseline she is ambulatory and can feed herself but both daughter provide most of her care and she does not typically recognize them. Daughter desires for patient to be able to come back home when medically ready - continue home meds  DVT prophylaxis:  enoxaparin (LOVENOX) injection 40 mg Start: 06/21/21 1000 Place TED hose Start: 06/19/21 2239   Diet:  Diet Orders (From admission, onward)     Start     Ordered   06/20/21 1040  Diet regular Room service appropriate? Yes; Fluid consistency: Thin  Diet effective now       Question Answer Comment  Room service appropriate? Yes   Fluid consistency: Thin      06/20/21 1039            Subjective 06/21/21    Pt reports that she is doing well.  She has no complaint at this time.  Disposition Plan & Communication  Patient status: Inpatient  Admitted From: Home Disposition: Home Anticipated discharge date: TBD  Family Communication: daughter at bedside  Consults, Procedures, Significant Events  Consultants:  none  Procedures/significant events:  None  Antimicrobials:  Anti-infectives (From admission, onward)    Start     Dose/Rate Route Frequency Ordered Stop   06/20/21 2200  cefTRIAXone (ROCEPHIN) 2 g in sodium chloride 0.9 % 100 mL IVPB        2 g 200 mL/hr over 30 Minutes Intravenous Every 24 hours 06/19/21 2240 06/26/21 2159   06/20/21 0015  cefTRIAXone (ROCEPHIN) 1 g in sodium chloride 0.9 % 100 mL IVPB       Note to Pharmacy: Patient met sepsis criteria and needs 2 g IV. EDP only ordered 1 g. Thank you.   1 g 200 mL/hr over 30 Minutes Intravenous  Once 06/20/21 0007 06/20/21 0131   06/19/21  2230  cefTRIAXone (ROCEPHIN) 1 g in sodium chloride 0.9 % 100 mL IVPB        1 g 200 mL/hr over 30 Minutes Intravenous  Once 06/19/21 2219 06/19/21 2312       Objective   Vitals:   06/20/21 1841 06/20/21 2134 06/20/21 2236 06/21/21 0351  BP: (!) 159/90 (!) 158/84  136/69  Pulse: 67 78  96  Resp:  20  20  Temp:  (!) 101.2 F (38.4 C) 99 F (37.2 C) 98.6 F (37 C)  TempSrc:  Oral Oral Oral  SpO2:  98%  95%  Weight:      Height:        Intake/Output Summary (Last 24 hours) at 06/21/2021 0718 Last data filed at 06/21/2021 0500 Gross per 24 hour  Intake 100 ml   Output 200 ml  Net -100 ml    Filed Weights   06/19/21 2056  Weight: 68 kg    Patient BMI: Body mass index is 29.28 kg/m.   Physical Exam:  General: awake, alert, NAD HEENT: atraumatic, clear conjunctiva, anicteric sclera, MMM Respiratory: normal respiratory effort. Clear breath sounds Cardiovascular: quick capillary refill  Nervous: alert and oriented to self. Able to tell me her name and have pleasant conversation. Daughter states this is baseline. Extremities: moves all equally, no edema, normal tone Skin: dry, intact, normal temperature, normal color. No rashes, lesions or ulcers on exposed skin  Labs   I have personally reviewed following labs and imaging studies Admission on 06/19/2021  Component Date Value Ref Range Status   SARS Coronavirus 2 by RT PCR 06/19/2021 POSITIVE (A)  NEGATIVE Final   Influenza A by PCR 06/19/2021 NEGATIVE  NEGATIVE Final   Influenza B by PCR 06/19/2021 NEGATIVE  NEGATIVE Final   Color, Urine 06/19/2021 YELLOW  YELLOW Final   APPearance 06/19/2021 CLEAR  CLEAR Final   Specific Gravity, Urine 06/19/2021 1.020  1.005 - 1.030 Final   pH 06/19/2021 6.0  5.0 - 8.0 Final   Glucose, UA 06/19/2021 NEGATIVE  NEGATIVE mg/dL Final   Hgb urine dipstick 06/19/2021 TRACE (A)  NEGATIVE Final   Bilirubin Urine 06/19/2021 NEGATIVE  NEGATIVE Final   Ketones, ur 06/19/2021 NEGATIVE  NEGATIVE mg/dL Final   Protein, ur 06/19/2021 NEGATIVE  NEGATIVE mg/dL Final   Nitrite 06/19/2021 POSITIVE (A)  NEGATIVE Final   Leukocytes,Ua 06/19/2021 TRACE (A)  NEGATIVE Final   Squamous Epithelial / LPF 06/19/2021 NONE SEEN  0 - 5 Final   WBC, UA 06/19/2021 11-20  0 - 5 WBC/hpf Final   RBC / HPF 06/19/2021 0-5  0 - 5 RBC/hpf Final   Bacteria, UA 06/19/2021 RARE (A)  NONE SEEN Final   Mucus 06/19/2021 PRESENT   Final   Hyaline Casts, UA 06/19/2021 PRESENT   Final   Lactic Acid, Venous 06/19/2021 3.5 (HH)  0.5 - 1.9 mmol/L Final   Lactic Acid, Venous 06/19/2021 2.0 (HH)  0.5  - 1.9 mmol/L Final   Sodium 06/19/2021 139  135 - 145 mmol/L Final   Potassium 06/19/2021 3.6  3.5 - 5.1 mmol/L Final   Chloride 06/19/2021 106  98 - 111 mmol/L Final   CO2 06/19/2021 26  22 - 32 mmol/L Final   Glucose, Bld 06/19/2021 112 (H)  70 - 99 mg/dL Final   BUN 06/19/2021 23  8 - 23 mg/dL Final   Creatinine, Ser 06/19/2021 1.16 (H)  0.44 - 1.00 mg/dL Final   Calcium 06/19/2021 9.6  8.9 - 10.3 mg/dL Final  Total Protein 06/19/2021 7.2  6.5 - 8.1 g/dL Final   Albumin 06/19/2021 3.8  3.5 - 5.0 g/dL Final   AST 06/19/2021 23  15 - 41 U/L Final   ALT 06/19/2021 10  0 - 44 U/L Final   Alkaline Phosphatase 06/19/2021 68  38 - 126 U/L Final   Total Bilirubin 06/19/2021 0.7  0.3 - 1.2 mg/dL Final   GFR, Estimated 06/19/2021 45 (L)  >60 mL/min Final   Anion gap 06/19/2021 7  5 - 15 Final   WBC 06/19/2021 4.7  4.0 - 10.5 K/uL Final   RBC 06/19/2021 4.19  3.87 - 5.11 MIL/uL Final   Hemoglobin 06/19/2021 13.0  12.0 - 15.0 g/dL Final   HCT 06/19/2021 39.5  36.0 - 46.0 % Final   MCV 06/19/2021 94.3  80.0 - 100.0 fL Final   MCH 06/19/2021 31.0  26.0 - 34.0 pg Final   MCHC 06/19/2021 32.9  30.0 - 36.0 g/dL Final   RDW 06/19/2021 14.1  11.5 - 15.5 % Final   Platelets 06/19/2021 137 (L)  150 - 400 K/uL Final   nRBC 06/19/2021 0.0  0.0 - 0.2 % Final   Neutrophils Relative % 06/19/2021 78  % Final   Neutro Abs 06/19/2021 3.7  1.7 - 7.7 K/uL Final   Lymphocytes Relative 06/19/2021 11  % Final   Lymphs Abs 06/19/2021 0.5 (L)  0.7 - 4.0 K/uL Final   Monocytes Relative 06/19/2021 10  % Final   Monocytes Absolute 06/19/2021 0.5  0.1 - 1.0 K/uL Final   Eosinophils Relative 06/19/2021 0  % Final   Eosinophils Absolute 06/19/2021 0.0  0.0 - 0.5 K/uL Final   Basophils Relative 06/19/2021 0  % Final   Basophils Absolute 06/19/2021 0.0  0.0 - 0.1 K/uL Final   Immature Granulocytes 06/19/2021 1  % Final   Abs Immature Granulocytes 06/19/2021 0.03  0.00 - 0.07 K/uL Final   Specimen Description  06/19/2021    Final                   Value:BLOOD LEFT FOREARM Performed at [facility], [address] [place], [place] [address]    Special Requests 06/19/2021    Final                   Value:BOTTLES DRAWN AEROBIC AND ANAEROBIC Blood Culture adequate volume Performed at [facility], [address] [place], [place] [address]    Culture  Setup Time 06/19/2021    Final                   Value:Organism ID to follow IN BOTH AEROBIC AND ANAEROBIC BOTTLES GRAM POSITIVE COCCI CRITICAL RESULT CALLED TO, READ BACK BY AND VERIFIED WITH: ALEX CHAPPELL 06/20/21 1449 KLW    Culture 06/19/2021 GRAM POSITIVE COCCI   Final   Report Status 06/19/2021 PENDING   Incomplete   Specimen Description 06/19/2021 BLOOD LEFT HAND   Final   Special Requests 06/19/2021 IN PEDIATRIC BOTTLE Blood Culture adequate volume   Final   Culture 06/19/2021    Final                   Value:NO GROWTH < 24 HOURS Performed at [facility], [address], [place], [place] [address]    Report Status 06/19/2021 PENDING   Incomplete   Magnesium 06/19/2021 1.9  1.7 - 2.4 mg/dL Final   Procalcitonin 06/19/2021 <0.10  ng/mL Corrected   Prothrombin  Time 06/19/2021 13.3  11.4 - 15.2 seconds Final   INR 06/19/2021 1.0  0.8 - 1.2 Final   aPTT 06/19/2021 29  24 - 36 seconds Final   Sodium 06/20/2021 138  135 - 145 mmol/L Final   Potassium 06/20/2021 3.5  3.5 - 5.1 mmol/L Final   Chloride 06/20/2021 106  98 - 111 mmol/L Final   CO2 06/20/2021 24  22 - 32 mmol/L Final   Glucose, Bld 06/20/2021 90  70 - 99 mg/dL Final   BUN 06/20/2021 21  8 - 23 mg/dL Final   Creatinine, Ser 06/20/2021 1.09 (H)  0.44 - 1.00 mg/dL Final   Calcium 06/20/2021 9.2  8.9 - 10.3 mg/dL Final   GFR, Estimated 06/20/2021 49 (L)  >60 mL/min Final   Anion gap 06/20/2021 8  5 - 15 Final   Magnesium 06/20/2021 1.8  1.7 - 2.4 mg/dL Final   Phosphorus 06/20/2021 2.8  2.5 - 4.6 mg/dL Final   Prothrombin Time 06/20/2021 13.2  11.4  - 15.2 seconds Final   INR 06/20/2021 1.0  0.8 - 1.2 Final   Cortisol - AM 06/20/2021 11.4  6.7 - 22.6 ug/dL Final   WBC 06/20/2021 4.8  4.0 - 10.5 K/uL Final   RBC 06/20/2021 3.74 (L)  3.87 - 5.11 MIL/uL Final   Hemoglobin 06/20/2021 11.6 (L)  12.0 - 15.0 g/dL Final   HCT 06/20/2021 35.3 (L)  36.0 - 46.0 % Final   MCV 06/20/2021 94.4  80.0 - 100.0 fL Final   MCH 06/20/2021 31.0  26.0 - 34.0 pg Final   MCHC 06/20/2021 32.9  30.0 - 36.0 g/dL Final   RDW 06/20/2021 14.3  11.5 - 15.5 % Final   Platelets 06/20/2021 111 (L)  150 - 400 K/uL Final   nRBC 06/20/2021 0.0  0.0 - 0.2 % Final   Neutrophils Relative % 06/20/2021 58  % Final   Neutro Abs 06/20/2021 2.7  1.7 - 7.7 K/uL Final   Lymphocytes Relative 06/20/2021 21  % Final   Lymphs Abs 06/20/2021 1.0  0.7 - 4.0 K/uL Final   Monocytes Relative 06/20/2021 20  % Final   Monocytes Absolute 06/20/2021 0.9  0.1 - 1.0 K/uL Final   Eosinophils Relative 06/20/2021 0  % Final   Eosinophils Absolute 06/20/2021 0.0  0.0 - 0.5 K/uL Final   Basophils Relative 06/20/2021 1  % Final   Basophils Absolute 06/20/2021 0.0  0.0 - 0.1 K/uL Final   Immature Granulocytes 06/20/2021 0  % Final   Abs Immature Granulocytes 06/20/2021 0.02  0.00 - 0.07 K/uL Final   Enterococcus faecalis 06/19/2021 NOT DETECTED  NOT DETECTED Final   Enterococcus Faecium 06/19/2021 NOT DETECTED  NOT DETECTED Final   Listeria monocytogenes 06/19/2021 NOT DETECTED  NOT DETECTED Final   Staphylococcus species 06/19/2021 DETECTED (A)  NOT DETECTED Final   Staphylococcus aureus (BCID) 06/19/2021 NOT DETECTED  NOT DETECTED Final   Staphylococcus epidermidis 06/19/2021 NOT DETECTED  NOT DETECTED Final   Staphylococcus lugdunensis 06/19/2021 NOT DETECTED  NOT DETECTED Final   Streptococcus species 06/19/2021 NOT DETECTED  NOT DETECTED Final   Streptococcus agalactiae 06/19/2021 NOT DETECTED  NOT DETECTED Final   Streptococcus pneumoniae 06/19/2021 NOT DETECTED  NOT DETECTED Final    Streptococcus pyogenes 06/19/2021 NOT DETECTED  NOT DETECTED Final   A.calcoaceticus-baumannii 06/19/2021 NOT DETECTED  NOT DETECTED Final   Bacteroides fragilis 06/19/2021 NOT DETECTED  NOT DETECTED Final   Enterobacterales 06/19/2021 NOT DETECTED  NOT DETECTED Final   Enterobacter  cloacae complex 06/19/2021 NOT DETECTED  NOT DETECTED Final   Escherichia coli 06/19/2021 NOT DETECTED  NOT DETECTED Final   Klebsiella aerogenes 06/19/2021 NOT DETECTED  NOT DETECTED Final   Klebsiella oxytoca 06/19/2021 NOT DETECTED  NOT DETECTED Final   Klebsiella pneumoniae 06/19/2021 NOT DETECTED  NOT DETECTED Final   Proteus species 06/19/2021 NOT DETECTED  NOT DETECTED Final   Salmonella species 06/19/2021 NOT DETECTED  NOT DETECTED Final   Serratia marcescens 06/19/2021 NOT DETECTED  NOT DETECTED Final   Haemophilus influenzae 06/19/2021 NOT DETECTED  NOT DETECTED Final   Neisseria meningitidis 06/19/2021 NOT DETECTED  NOT DETECTED Final   Pseudomonas aeruginosa 06/19/2021 NOT DETECTED  NOT DETECTED Final   Stenotrophomonas maltophilia 06/19/2021 NOT DETECTED  NOT DETECTED Final   Candida albicans 06/19/2021 NOT DETECTED  NOT DETECTED Final   Candida auris 06/19/2021 NOT DETECTED  NOT DETECTED Final   Candida glabrata 06/19/2021 NOT DETECTED  NOT DETECTED Final   Candida krusei 06/19/2021 NOT DETECTED  NOT DETECTED Final   Candida parapsilosis 06/19/2021 NOT DETECTED  NOT DETECTED Final   Candida tropicalis 06/19/2021 NOT DETECTED  NOT DETECTED Final   Cryptococcus neoformans/gattii 06/19/2021 NOT DETECTED  NOT DETECTED Final   WBC 06/21/2021 3.5 (L)  4.0 - 10.5 K/uL Final   RBC 06/21/2021 3.86 (L)  3.87 - 5.11 MIL/uL Final   Hemoglobin 06/21/2021 11.8 (L)  12.0 - 15.0 g/dL Final   HCT 06/21/2021 35.7 (L)  36.0 - 46.0 % Final   MCV 06/21/2021 92.5  80.0 - 100.0 fL Final   MCH 06/21/2021 30.6  26.0 - 34.0 pg Final   MCHC 06/21/2021 33.1  30.0 - 36.0 g/dL Final   RDW 06/21/2021 14.1  11.5 - 15.5 %  Final   Platelets 06/21/2021 117 (L)  150 - 400 K/uL Final   nRBC 06/21/2021 0.0  0.0 - 0.2 % Final   Sodium 06/21/2021 136  135 - 145 mmol/L Final   Potassium 06/21/2021 3.1 (L)  3.5 - 5.1 mmol/L Final   Chloride 06/21/2021 105  98 - 111 mmol/L Final   CO2 06/21/2021 25  22 - 32 mmol/L Final   Glucose, Bld 06/21/2021 91  70 - 99 mg/dL Final   BUN 06/21/2021 22  8 - 23 mg/dL Final   Creatinine, Ser 06/21/2021 0.97  0.44 - 1.00 mg/dL Final   Calcium 06/21/2021 8.6 (L)  8.9 - 10.3 mg/dL Final   GFR, Estimated 06/21/2021 56 (L)  >60 mL/min Final   Anion gap 06/21/2021 6  5 - 15 Final    Imaging Studies  CT HEAD WO CONTRAST (5MM)  Result Date: 06/19/2021 CLINICAL DATA:  Altered mental status. EXAM: CT HEAD WITHOUT CONTRAST TECHNIQUE: Contiguous axial images were obtained from the base of the skull through the vertex without intravenous contrast. COMPARISON:  January 18, 2019 FINDINGS: Brain: There is moderate severity cerebral atrophy with widening of the extra-axial spaces and ventricular dilatation. There are areas of decreased attenuation within the white matter tracts of the supratentorial brain, consistent with microvascular disease changes. Vascular: Marked severity arterial calcification is seen along the skull base. Skull: Normal. Negative for fracture or focal lesion. Sinuses/Orbits: No acute finding. Other: None. IMPRESSION: 1. Generalized cerebral atrophy. 2. No acute intracranial abnormality. Electronically Signed   By: Virgina Norfolk M.D.   On: 06/19/2021 21:37   DG Chest Port 1 View  Result Date: 06/19/2021 CLINICAL DATA:  Possible sepsis EXAM: PORTABLE CHEST 1 VIEW COMPARISON:  12/07/2019 FINDINGS: Cardiac shadow is  enlarged but stable. Tortuous thoracic aorta with calcifications is not seen. Lungs are clear bilaterally. No focal infiltrate or effusion is noted. No bony abnormality is seen. IMPRESSION: No active disease. Electronically Signed   By: [name] M.D.   On: 06/19/2021  21:12    Medications   Scheduled Meds:  donepezil  10 mg Oral QHS   enoxaparin (LOVENOX) injection  40 mg Subcutaneous Daily   mouth rinse  15 mL Mouth Rinse BID   memantine  10 mg Oral BID   metoprolol tartrate  25 mg Oral BID   No recently discontinued medications to reconcile  LOS: 1 day   Time spent: >64mn  Pleasant Bensinger L Rodnisha Blomgren, DO [facility] 06/21/2021, 7:18 AM   Available by Epic secure chat 7AM-7PM. If 7PM-7AM, please contact night-coverage Refer to [email or web address] to contact the [facility] or Consulting provider for this pt

## 2021-06-21 NOTE — Evaluation (Signed)
Occupational Therapy Evaluation Patient Details Name: Wendy Krueger MRN: [id number] DOB: December 18, 1929 Today's Date: 06/21/2021   History of Present Illness presented to ER secondary to AMS, progressive weakness; admitted for management of sepsis, COVID +   Clinical Impression   Pt seen for OT evaluation this date. She is pleasantly confused, able to follow basic commands with increased processing time and multimodal cueing. She currently requires: SETUP with visual/tactile cues to inititate seated UB ADLs, MOD A for LB ADLs. MOD cues for safety and MIN A for STS with RW. Pt is returned to bed end of session with all needs met and in reach. Bed alarm set, pure-wick removed by patient d/t confusion and RN notified. Will continue to follow acutely. Currently anticipating that pt will require STR f/u OT services.     Recommendations for follow up therapy are one component of a multi-disciplinary discharge planning process, led by the attending physician.  Recommendations may be updated based on patient status, additional functional criteria and insurance authorization.   Follow Up Recommendations  Skilled nursing-short term rehab (<3 hours/day)    Assistance Recommended at Discharge Frequent or constant Supervision/Assistance  Functional Status Assessment  Patient has had a recent decline in their functional status and demonstrates the ability to make significant improvements in function in a reasonable and predictable amount of time.  Equipment Recommendations  Other (comment) (defer to next venue)    Recommendations for Other Services       Precautions / Restrictions Precautions Precautions: Fall Restrictions Weight Bearing Restrictions: No      Mobility Bed Mobility Overal bed mobility: Needs Assistance Bed Mobility: Supine to Sit;Sit to Supine     Supine to sit: Min guard Sit to supine: Min assist        Transfers Overall transfer level: Needs assistance Equipment  used: Rolling walker (2 wheels) Transfers: Sit to/from Stand Sit to Stand: Min assist           General transfer comment: hand-over-hand to initiate and complete movement      Balance Overall balance assessment: Needs assistance Sitting-balance support: No upper extremity supported;Feet supported Sitting balance-Leahy Scale: Good     Standing balance support: Bilateral upper extremity supported;Reliant on assistive device for balance Standing balance-Leahy Scale: Fair                             ADL either performed or assessed with clinical judgement   ADL Overall ADL's : Needs assistance/impaired                                       General ADL Comments: SETUP with visual/tactile cues to inititate seated UB ADLs, MOD A for LB ADLs. MOD cues for safety and MIN A for STS with RW.     Vision Patient Visual Report: No change from baseline       Perception     Praxis      Pertinent Vitals/Pain Pain Assessment: Faces Faces Pain Scale: No hurt     Hand Dominance     Extremity/Trunk Assessment Upper Extremity Assessment Upper Extremity Assessment: Overall WFL for tasks assessed;Difficult to assess due to impaired cognition   Lower Extremity Assessment Lower Extremity Assessment: Overall WFL for tasks assessed;Difficult to assess due to impaired cognition       Communication Communication Communication: Other (comment) (pleasant and cooperative, but  no purposeful communication/unable to make needs known)   Cognition Arousal/Alertness: Awake/alert Behavior During Therapy: WFL for tasks assessed/performed Overall Cognitive Status: No family/caregiver present to determine baseline cognitive functioning                                 General Comments: Responds to name, but unable to purposely verbalize name or other specific information; verbally responds to therapist interaction, but unable to directly answer questions or  participate with formal conversation/convey needs.  does follow simple commands, but performance and participation optimized with use of gestures and hand-over-hand to initiate/guide movement     General Comments       Exercises     Shoulder Instructions      Home Living Family/patient expects to be discharged to:: Private residence Living Arrangements: Children Available Help at Discharge: Family;Available 24 hours/day Type of Home: House                           Additional Comments: Patient unable to provide social history; information taken from chart.  Will verify with family member as available.      Prior Functioning/Environment Prior Level of Function : Needs assist       Physical Assist : Mobility (physical) Mobility (physical): Transfers;Gait   Mobility Comments: Per chart, ambulatory with RW and +1 assist at baseline ADLs Comments: Per chart, feeds self at baseline; daughter assists as needed        OT Problem List: Decreased strength;Decreased activity tolerance;Impaired balance (sitting and/or standing);Decreased knowledge of use of DME or AE      OT Treatment/Interventions: Self-care/ADL training;Therapeutic exercise;DME and/or AE instruction;Therapeutic activities;Patient/family education;Balance training    OT Goals(Current goals can be found in the care plan section) Acute Rehab OT Goals Patient Stated Goal: none stated OT Goal Formulation: Patient unable to participate in goal setting Time For Goal Achievement: 07/05/21 Potential to Achieve Goals: Fair ADL Goals Pt Will Perform Lower Body Dressing: with supervision;with set-up Pt Will Transfer to Toilet: with set-up;with supervision;bedside commode Pt Will Perform Toileting - Clothing Manipulation and hygiene: with set-up;with supervision;sit to/from stand  OT Frequency: Min 2X/week   Barriers to D/C:            Co-evaluation              AM-PAC OT "6 Clicks" Daily Activity      Outcome Measure Help from another person eating meals?: None Help from another person taking care of personal grooming?: A Little Help from another person toileting, which includes using toliet, bedpan, or urinal?: A Lot Help from another person bathing (including washing, rinsing, drying)?: A Lot Help from another person to put on and taking off regular upper body clothing?: A Little Help from another person to put on and taking off regular lower body clothing?: A Lot 6 Click Score: 16   End of Session Equipment Utilized During Treatment: Gait belt;Rolling walker (2 wheels) Nurse Communication: Mobility status  Activity Tolerance: Patient tolerated treatment well Patient left: with call bell/phone within reach;in bed;with bed alarm set  OT Visit Diagnosis: Unsteadiness on feet (R26.81);History of falling (Z91.81)                Time: 8563-1497 OT Time Calculation (min): 16 min Charges:  OT General Charges $OT Visit: 1 Visit OT Evaluation $OT Eval Moderate Complexity: 1 Mod  Gerrianne Scale, MS, OTR/L ascom  [phone] 06/21/21, 5:08 PM

## 2021-06-21 NOTE — Evaluation (Signed)
Physical Therapy Evaluation Patient Details Name: Wendy Krueger MRN: [id number] DOB: 01/10/1930 Today's Date: 06/21/2021  History of Present Illness  presented to ER secondary to AMS, progressive weakness; admitted for management of sepsis, COVID +  Clinical Impression  Patient resting in bed upon arrival to session; alert and response to therapist interaction. Unable to state name, answer questions, or maintain formal conversation due to cognitive deficits; does respond well to gestures and hand-over-hand assist for functional activities. Bilat UE/LE strength and ROM grossly symmetrical and WFL; no focal weakness appreciated. Able to complete bed mobility with min assist; sit/stand, basic transfers and gait (30') with RW, cga/min assist.  Demonstrates reciprocal stepping pattern; min assist to guide/negotiate RW around room environment. No buckling or LOB, but do recommend continued +1 for optimal safety with mobiltiy tasks (given limited insight and ability to problem-solve situations) Would benefit from skilled PT to address above deficits and promote optimal return to PLOF.; Recommend transition to HHPT upon discharge from acute hospitalization.  Patient to benefit optimally from return to normal, familiar environment, schedule and caregivers.       Recommendations for follow up therapy are one component of a multi-disciplinary discharge planning process, led by the attending physician.  Recommendations may be updated based on patient status, additional functional criteria and insurance authorization.  Follow Up Recommendations Home health PT    Assistance Recommended at Discharge Frequent or constant Supervision/Assistance  Functional Status Assessment Patient has had a recent decline in their functional status and demonstrates the ability to make significant improvements in function in a reasonable and predictable amount of time.  Equipment Recommendations       Recommendations for  Other Services       Precautions / Restrictions Precautions Precautions: Fall Restrictions Weight Bearing Restrictions: No      Mobility  Bed Mobility Overal bed mobility: Needs Assistance Bed Mobility: Supine to Sit     Supine to sit: Min guard          Transfers Overall transfer level: Needs assistance Equipment used: Rolling walker (2 wheels) Transfers: Sit to/from Stand Sit to Stand: Min assist           General transfer comment: hand-over-hand to initiate and complete movement    Ambulation/Gait Ambulation/Gait assistance: Min assist Gait Distance (Feet): 30 Feet Assistive device: Rolling walker (2 wheels)         General Gait Details: reciprocal stepping pattern; min assist to guide/negotiate RW around room environment. No buckling or LOB, but do recommend continued +1 for optimal safety with mobiltiy tasks (given limited insight and ability to problem-solve situations)  Financial trader Rankin (Stroke Patients Only)       Balance Overall balance assessment: Needs assistance Sitting-balance support: No upper extremity supported;Feet supported Sitting balance-Leahy Scale: Good     Standing balance support: Bilateral upper extremity supported Standing balance-Leahy Scale: Fair                               Pertinent Vitals/Pain Pain Assessment: Faces Faces Pain Scale: No hurt Breathing: normal Negative Vocalization: none Facial Expression: smiling or inexpressive Body Language: relaxed Consolability: no need to console PAINAD Score: 0    Home Living Family/patient expects to be discharged to:: Private residence Living Arrangements: Children Available Help at Discharge: Family;Available 24 hours/day Type of Home: House  Additional Comments: Patient unable to provide social history; information taken from chart.  Will verify with family member as available.    Prior  Function Prior Level of Function : Needs assist       Physical Assist : Mobility (physical) Mobility (physical): Transfers;Gait   Mobility Comments: Per chart, ambulatory with RW and +1 assist at baseline ADLs Comments: Per chart, feeds self at baseline; daughter assists as needed     Hand Dominance        Extremity/Trunk Assessment   Upper Extremity Assessment Upper Extremity Assessment: Overall WFL for tasks assessed (grossly 4/5 as noted with functional activities)    Lower Extremity Assessment Lower Extremity Assessment: Overall WFL for tasks assessed (grossly 4/5 as noted with functional activities)       Communication   Communication:  (unable to effectively answer questions or participate with specific conversation (due to cognitive deficits), but does verbally respond (often "Mm, hmm; okay, honey").  Very pleasant and cooperative)  Cognition Arousal/Alertness: Awake/alert Behavior During Therapy: WFL for tasks assessed/performed Overall Cognitive Status: No family/caregiver present to determine baseline cognitive functioning                                 General Comments: Responds to name, but unable to purposely verbalize name or other specific information; verbally responds to therapist interaction, but unable to directly answer questions or participate with formal conversation/convey needs.  does follow simple commands, but performance and participation optimized with use of gestures and hand-over-hand to initiate/guide movement        General Comments      Exercises     Assessment/Plan    PT Assessment Patient needs continued PT services  PT Problem List Decreased activity tolerance;Decreased balance;Decreased mobility;Decreased knowledge of use of DME;Decreased cognition;Decreased safety awareness;Decreased knowledge of precautions       PT Treatment Interventions DME instruction;Gait training;Stair training;Functional mobility  training;Therapeutic activities;Therapeutic exercise;Balance training;Cognitive remediation;Patient/family education    PT Goals (Current goals can be found in the Care Plan section)  Acute Rehab PT Goals PT Goal Formulation: Patient unable to participate in goal setting Time For Goal Achievement: 07/05/21 Potential to Achieve Goals: Good    Frequency Min 2X/week   Barriers to discharge        Co-evaluation               AM-PAC PT "6 Clicks" Mobility  Outcome Measure Help needed turning from your back to your side while in a flat bed without using bedrails?: None Help needed moving from lying on your back to sitting on the side of a flat bed without using bedrails?: A Little Help needed moving to and from a bed to a chair (including a wheelchair)?: A Little Help needed standing up from a chair using your arms (e.g., wheelchair or bedside chair)?: A Little Help needed to walk in hospital room?: A Little Help needed climbing 3-5 steps with a railing? : A Little 6 Click Score: 19    End of Session Equipment Utilized During Treatment: Gait belt Activity Tolerance: Patient tolerated treatment well Patient left: in chair;with call bell/phone within reach;with chair alarm set Nurse Communication: Mobility status PT Visit Diagnosis: Muscle weakness (generalized) (M62.81);Difficulty in walking, not elsewhere classified (R26.2)    Time: 3825-0539 PT Time Calculation (min) (ACUTE ONLY): 16 min   Charges:   PT Evaluation $PT Eval Moderate Complexity: 1 Mod  Juell Radney H. Owens Shark, PT, DPT, NCS 06/21/21, 2:20 PM [phone]

## 2021-06-22 DIAGNOSIS — R0602 Shortness of breath: Secondary | ICD-10-CM

## 2021-06-22 LAB — BASIC METABOLIC PANEL
Anion gap: 3 — ABNORMAL LOW (ref 5–15)
BUN: 22 mg/dL (ref 8–23)
CO2: 27 mmol/L (ref 22–32)
Calcium: 8.6 mg/dL — ABNORMAL LOW (ref 8.9–10.3)
Chloride: 109 mmol/L (ref 98–111)
Creatinine, Ser: 1.02 mg/dL — ABNORMAL HIGH (ref 0.44–1.00)
GFR, Estimated: 53 mL/min — ABNORMAL LOW (ref >60)
Glucose, Bld: 90 mg/dL (ref 70–99)
Potassium: 3.9 mmol/L (ref 3.5–5.1)
Sodium: 139 mmol/L (ref 135–145)

## 2021-06-22 LAB — CULTURE, BLOOD (ROUTINE X 2): Special Requests: ADEQUATE

## 2021-06-22 LAB — URINE CULTURE: Culture: >=100000 — AB

## 2021-06-22 MED ORDER — CEPHALEXIN 500 MG PO CAPS: 500.0000 mg | ORAL_CAPSULE | Freq: Four times a day (QID) | ORAL | 0 refills | Status: AC

## 2021-06-22 NOTE — TOC Initial Note (Signed)
Transition of Care [facility]) - Initial/Assessment Note    Patient Details  Name: Wendy Krueger MRN: [id number] Date of Birth: Dec 12, 1929  [facility] CM/SW Contact:    Pete Pelt, RN Phone Number: 06/22/2021, 10:39 AM  Clinical Narrative:    Patient has dementia, no alert and oriented, spoke to daughter.  Daughter states she and her siblings care for patient at home.  Home health from advanced ordered per Ramond Marrow, PT OT aide.    As per PT, patient is safe to discharge with [place].  Daughter is anticipating discharge today, family will transport.               Expected Discharge Plan: [place] Barriers to Discharge: Barriers Resolved   Patient Goals and CMS Choice     Choice offered to / list presented to : NA  Expected Discharge Plan and Services Expected Discharge Plan: [place]   Discharge Planning Services: CM Consult Post Acute Care Choice: [place] arrangements for the past 2 months: Single Family Home Expected Discharge Date: 06/22/21               DME Arranged:  (none recommended)         HH Arranged: PT, OT, Nurse's Aide HH Agency: [place] ([place]) Date [place]: 06/22/21 Time [place]: 1039 Representative spoke with at [place]: Ramond Marrow  Prior Living Arrangements/Services Living arrangements for the past 2 months: [place] with:: Self, Adult Children Patient language and need for interpreter reviewed:: No Do you feel safe going back to the place where you live?: Yes      Need for Family Participation in Patient Care: Yes (Comment) Care giver support system in place?: Yes (comment)   Criminal Activity/Legal Involvement Pertinent to Current Situation/Hospitalization: No - Comment as needed  Activities of Daily Living Home Assistive Devices/Equipment: Shower chair without back, Hand-held shower hose, Cane (specify quad or straight) ADL  Screening (condition at time of admission) Patient's cognitive ability adequate to safely complete daily activities?: No Is the patient deaf or have difficulty hearing?: Yes Does the patient have difficulty seeing, even when wearing glasses/contacts?: Yes Does the patient have difficulty concentrating, remembering, or making decisions?: Yes Patient able to express need for assistance with ADLs?: Yes Does the patient have difficulty dressing or bathing?: Yes Independently performs ADLs?: No Communication: Independent Dressing (OT): Needs assistance Is this a change from baseline?: Pre-admission baseline Grooming: Needs assistance Is this a change from baseline?: Pre-admission baseline Feeding: Needs assistance Is this a change from baseline?: Change from baseline, expected to last >3 days Bathing: Needs assistance Is this a change from baseline?: Pre-admission baseline Toileting: Needs assistance Is this a change from baseline?: Pre-admission baseline In/Out Bed: Needs assistance Is this a change from baseline?: Pre-admission baseline Walks in Home: Needs assistance Is this a change from baseline?: Pre-admission baseline Does the patient have difficulty walking or climbing stairs?: Yes Weakness of Legs: Both Weakness of Arms/Hands: Both  Permission Sought/Granted Permission sought to share information with : Case Manager Permission granted to share information with : Yes, Verbal Permission Granted     Permission granted to share info w AGENCY: Home Health        Emotional Assessment       Orientation: :  (dementia) Alcohol / Substance Use: Not Applicable Psych Involvement: No (comment)  Admission diagnosis:  SOB (shortness of breath) [R06.02] Acute cystitis without hematuria [  N30.00] Sepsis ([place]) [A41.9] Altered mental status, unspecified altered mental status type [R41.82] Sepsis, due to unspecified organism, unspecified whether acute organ dysfunction present ([place])  [A41.9] COVID [U07.1] Patient Active Problem List   Diagnosis Date Noted   SOB (shortness of breath) 06/20/2021   Acute cystitis without hematuria    Altered mental status    COVID    Sepsis ([place]) 06/19/2021   Closed right hip fracture ([place]) 12/07/2019   Pedal edema 08/13/2019   Polyp of colon    Gastrointestinal hemorrhage    Chronic renal failure, stage 3b (HCC)    DVT of lower extremity, bilateral ([place]) 08/06/2019   Bleeding hemorrhoids 08/06/2019   DVT, lower extremity, distal, acute, bilateral ([place]) 08/06/2019   Advanced dementia 08/02/2019   Abnormal CT of the abdomen    External hemorrhoids    Transient alteration of awareness 07/14/2018   Chronic diarrhea 02/17/2018   History of fracture of radius 02/17/2018   Hypokalemia 02/09/2018   Hypotension 02/09/2018   History of lower GI bleeding 02/09/2018   Acute blood loss anemia 02/09/2018   History of CVA (cerebrovascular accident) 03/10/2016   Carotid aneurysm, right ([place]) 02/21/2016   Lipoma of shoulder 03/27/2015   History of closed Colles' fracture 09/06/2014   Allergic rhinitis 68/05/7516   Diastolic dysfunction 00/17/4944   Iron deficiency anemia 03/25/2014   History of GI bleed 03/21/2014   BRBPR (bright red blood per rectum) 03/19/2014   Anxiety 09/01/2013   Encounter for Medicare annual wellness exam 01/08/2013   Gallstones 11/11/2011   Arterial ischemic stroke, 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   Risk for falls 06/04/2011   Constipation 03/05/2011   Vitamin D deficiency 03/28/2009   HYPERCHOLESTEROLEMIA, PURE 03/17/2007   Essential hypertension 03/16/2007   OSTEOARTHRITIS 03/16/2007   Osteoporosis 03/16/2007   PCP:  Abner Greenspan, MD Pharmacy:   [id number] - [place], [place] - [address] [place] [address] Phone: [phone] Fax: [phone]     Social Determinants of Health (SDOH) Interventions    Readmission Risk  Interventions Readmission Risk Prevention Plan 07/07/2019  Post Dischage Appt Not Complete  Appt Comments pending stability  Medication Screening Complete  Transportation Screening Complete  Some recent data might be hidden

## 2021-06-22 NOTE — Care Management Important Message (Signed)
Important Message  Patient Details  Name: Wendy Krueger MRN: [id number] Date of Birth: 01-10-1930   Medicare Important Message Given:  N/A - LOS <3 / Initial given by admissions     [name] 06/22/2021, 7:54 AM

## 2021-06-22 NOTE — Discharge Summary (Addendum)
Physician Discharge Summary  Wendy Krueger VZC:[id number] DOB: 1929/10/25 DOA: 06/19/2021  PCP: Abner Greenspan, MD  Admit date: 06/19/2021 Discharge date: 06/22/2021  Admitted From: Home Disposition: Relative's home  Recommendations for Outpatient Follow-up:  Follow up with PCP within 1-2 weeks for urinalysis follow up to ensure resolution of hematuria.  Monitor blood pressure and electrolytes as losartan and HCTZ were discontinued this admission with maintaining a normal BP throughout.   OT recommended SNF. Family wishes to bring her home. Ordered hhOT Equipment/Devices:none recommended  Discharge Condition:stable, improved CODE STATUS:  Code Status: DNR  Regular healthy diet  Brief/Interim Summary: Pt presented encephalopathic 2/2 acute illness including COVID positive and UTI. Urine Cx showed E coli. And blood cultures were negative other than contaminant. With IV antibiotics and fluid support, patient improved back to baseline relatively quickly. She was stable on room air, afebrile and denied urinary symptoms. She was tolerating a normal diet with assistance to feed due to her moderate dementia. Her family endorsed that she was at her baseline.  OT recommended SNF, PT did not recommend follow up. Patient to be discharged home with good family support in place and order for home health OT.   Discharge Diagnoses:  Principal Problem:   Sepsis ([place]) Active Problems:   Vitamin D deficiency   Essential hypertension   Anxiety   SOB (shortness of breath)  Allergies as of 06/22/2021       Reactions   Alendronate Sodium Other (See Comments)   Leg pain    Amlodipine Besylate Hives   Calcitonin (salmon) Other (See Comments)    headache/ head pressure   Plavix [clopidogrel Bisulfate] Other (See Comments)   drowsy     Simvastatin Other (See Comments)   Leg pain         Medication List     STOP taking these medications    hydrochlorothiazide 25 MG tablet Commonly  known as: HYDRODIURIL   Klor-Con M10 10 MEQ tablet Generic drug: potassium chloride   losartan 100 MG tablet Commonly known as: COZAAR       TAKE these medications    acetaminophen 500 MG tablet Commonly known as: TYLENOL Take 500 mg by mouth every 6 (six) hours as needed.   ALLERGY PO Take 1 tablet by mouth daily.   cephALEXin 500 MG capsule Commonly known as: KEFLEX Take 1 capsule (500 mg total) by mouth 4 (four) times daily for 5 days.   [employer] SPECTRAVITE PO Take 1 capsule by mouth daily.   donepezil 10 MG tablet Commonly known as: ARICEPT TAKE 1 TABLET BY MOUTH AT BEDTIME NEEDS APPT FOR REFILLS   iron polysaccharides 150 MG capsule Commonly known as: NIFEREX TAKE 1 CAPSULE (150 MG TOTAL) BY MOUTH EVERY OTHER DAY.   Melatonin 10 MG Tabs Take 10 mg by mouth at bedtime.   memantine 10 MG tablet Commonly known as: NAMENDA TAKE 1 TABLET BY MOUTH TWICE A DAY   metoprolol tartrate 25 MG tablet Commonly known as: LOPRESSOR TAKE 1 TABLET BY MOUTH TWICE A DAY   polyethylene glycol 17 g packet Commonly known as: MIRALAX / GLYCOLAX Take 17 g by mouth daily. What changed:  when to take this reasons to take this   PROBIOTIC PO Take 1 tablet by mouth every other day.   QUEtiapine 25 MG tablet Commonly known as: SEROQUEL TAKE 1/2-1 TABLET BY MOUTH AS NEEDED IN THE EVENING FOR AGITATION/HALLUCINATIONS   vitamin B-12 1000 MCG tablet Commonly known as: CYANOCOBALAMIN Take 1,000 mcg  by mouth daily.   vitamin C 1000 MG tablet Take 1,000 mg by mouth daily.        Allergies  Allergen Reactions   Alendronate Sodium Other (See Comments)    Leg pain    Amlodipine Besylate Hives   Calcitonin ([place]) Other (See Comments)     headache/ head pressure   Plavix [Clopidogrel Bisulfate] Other (See Comments)    drowsy     Simvastatin Other (See Comments)    Leg pain     Consultations: None   Procedures/Studies: CT HEAD WO CONTRAST (5MM)  Result Date:  06/19/2021 CLINICAL DATA:  Altered mental status. EXAM: CT HEAD WITHOUT CONTRAST TECHNIQUE: Contiguous axial images were obtained from the base of the skull through the vertex without intravenous contrast. COMPARISON:  January 18, 2019 FINDINGS: Brain: There is moderate severity cerebral atrophy with widening of the extra-axial spaces and ventricular dilatation. There are areas of decreased attenuation within the white matter tracts of the supratentorial brain, consistent with microvascular disease changes. Vascular: Marked severity arterial calcification is seen along the skull base. Skull: Normal. Negative for fracture or focal lesion. Sinuses/Orbits: No acute finding. Other: None. IMPRESSION: 1. Generalized cerebral atrophy. 2. No acute intracranial abnormality. Electronically Signed   By: Virgina Norfolk M.D.   On: 06/19/2021 21:37   DG Chest Port 1 View  Result Date: 06/19/2021 CLINICAL DATA:  Possible sepsis EXAM: PORTABLE CHEST 1 VIEW COMPARISON:  12/07/2019 FINDINGS: Cardiac shadow is enlarged but stable. Tortuous thoracic aorta with calcifications is not seen. Lungs are clear bilaterally. No focal infiltrate or effusion is noted. No bony abnormality is seen. IMPRESSION: No active disease. Electronically Signed   By: [name] M.D.   On: 06/19/2021 21:12    Subjective: Patient feels good today. She has no complaints. Oriented to self. She is excited to go home.   Discharge Exam: Vitals:   06/22/21 0446 06/22/21 0836  BP: (!) 141/85 134/78  Pulse: 87 78  Resp: 18 18  Temp: 98.6 F (37 C) 97.8 F (36.6 C)  SpO2: 96% 97%    General: Pt is alert, awake, not in acute distress Cardiovascular: RRR, S1/S2 +, no rubs, no gallops Respiratory: CTA bilaterally, no wheezing, no rhonchi Abdominal: Soft, NT, ND, bowel sounds + Extremities: no edema, no cyanosis  Labs: Basic Metabolic Panel: Recent Labs  Lab 06/19/21 2126 06/20/21 0629 06/21/21 0549  NA 139 138 136  K 3.6 3.5 3.1*  CL  106 106 105  CO2 26 24 25   GLUCOSE 112* 90 91  BUN 23 21 22   CREATININE 1.16* 1.09* 0.97  CALCIUM 9.6 9.2 8.6*  MG 1.9 1.8 1.7  PHOS  --  2.8  --    CBC: Recent Labs  Lab 06/19/21 2126 06/20/21 1248 06/21/21 0549  WBC 4.7 4.8 3.5*  NEUTROABS 3.7 2.7  --   HGB 13.0 11.6* 11.8*  HCT 39.5 35.3* 35.7*  MCV 94.3 94.4 92.5  PLT 137* 111* 117*    Microbiology Recent Results (from the past 240 hour(s))  Resp Panel by RT-PCR (Flu A&B, Covid) Nasopharyngeal Swab     Status: Abnormal   Collection Time: 06/19/21  9:26 PM   Specimen: Nasopharyngeal Swab; Nasopharyngeal(NP) swabs in vial transport medium  Result Value Ref Range Status   SARS Coronavirus 2 by RT PCR POSITIVE (A) NEGATIVE Final    Comment: (NOTE) SARS-CoV-2 target nucleic acids are DETECTED.  The SARS-CoV-2 RNA is generally detectable in upper respiratory specimens during the acute phase of  infection. Positive results are indicative of the presence of the identified virus, but do not rule out bacterial infection or co-infection with other pathogens not detected by the test. Clinical correlation with patient history and other diagnostic information is necessary to determine patient infection status. The expected result is Negative.  Fact Sheet for Patients: EntrepreneurPulse.com.au  Fact Sheet for Healthcare Providers: IncredibleEmployment.be  This test is not yet approved or cleared by the [place] FDA and  has been authorized for detection and/or diagnosis of SARS-CoV-2 by FDA under an Emergency Use Authorization (EUA).  This EUA will remain in effect (meaning this test can be used) for the duration of  the COVID-19 declaration under Section 564(b)(1) of the A ct, 21 U.S.C. section 360bbb-3(b)(1), unless the authorization is terminated or revoked sooner.     Influenza A by PCR NEGATIVE NEGATIVE Final   Influenza B by PCR NEGATIVE NEGATIVE Final    Comment: (NOTE) The  Xpert Xpress SARS-CoV-2/FLU/RSV plus assay is intended as an aid in the diagnosis of influenza from Nasopharyngeal swab specimens and should not be used as a sole basis for treatment. Nasal washings and aspirates are unacceptable for Xpert Xpress SARS-CoV-2/FLU/RSV testing.  Fact Sheet for Patients: EntrepreneurPulse.com.au  Fact Sheet for Healthcare Providers: IncredibleEmployment.be  This test is not yet approved or cleared by the [place] FDA and has been authorized for detection and/or diagnosis of SARS-CoV-2 by FDA under an Emergency Use Authorization (EUA). This EUA will remain in effect (meaning this test can be used) for the duration of the COVID-19 declaration under Section 564(b)(1) of the Act, 21 U.S.C. section 360bbb-3(b)(1), unless the authorization is terminated or revoked.  Performed at [facility], [address], [place], [place] [address]   Blood Culture (routine x 2)     Status: Abnormal   Collection Time: 06/19/21  9:26 PM   Specimen: BLOOD  Result Value Ref Range Status   Specimen Description   Final    BLOOD LEFT FOREARM Performed at [facility], [address] [place], [place] [address]    Special Requests   Final    BOTTLES DRAWN AEROBIC AND ANAEROBIC Blood Culture adequate volume Performed at [facility], [address] [place], [place] [address]    Culture  Setup Time   Final    Organism ID to follow IN BOTH AEROBIC AND ANAEROBIC BOTTLES GRAM POSITIVE COCCI CRITICAL RESULT CALLED TO, READ BACK BY AND VERIFIED WITH: ALEX CHAPPELL 06/20/21 1449 KLW    Culture (A)  Final    STAPHYLOCOCCUS HOMINIS THE SIGNIFICANCE OF ISOLATING THIS ORGANISM FROM A SINGLE SET OF BLOOD CULTURES WHEN MULTIPLE SETS ARE DRAWN IS UNCERTAIN. PLEASE NOTIFY THE MICROBIOLOGY DEPARTMENT WITHIN ONE WEEK IF SPECIATION AND SENSITIVITIES ARE REQUIRED. Performed at [facility], [place] [address],  [place], [place] [address]    Report Status 06/22/2021 FINAL  Final  Blood Culture ID Panel (Reflexed)     Status: Abnormal   Collection Time: 06/19/21  9:26 PM  Result Value Ref Range Status   Enterococcus faecalis NOT DETECTED NOT DETECTED Final   Enterococcus Faecium NOT DETECTED NOT DETECTED Final   Listeria monocytogenes NOT DETECTED NOT DETECTED Final   Staphylococcus species DETECTED (A) NOT DETECTED Final    Comment: CRITICAL RESULT CALLED TO, READ BACK BY AND VERIFIED WITH: ALEX CHAPPELL 06/20/21 1449 KLW    Staphylococcus aureus (BCID) NOT DETECTED NOT DETECTED Final   Staphylococcus epidermidis NOT DETECTED NOT DETECTED Final   Staphylococcus  lugdunensis NOT DETECTED NOT DETECTED Final   Streptococcus species NOT DETECTED NOT DETECTED Final   Streptococcus agalactiae NOT DETECTED NOT DETECTED Final   Streptococcus pneumoniae NOT DETECTED NOT DETECTED Final   Streptococcus pyogenes NOT DETECTED NOT DETECTED Final   A.calcoaceticus-baumannii NOT DETECTED NOT DETECTED Final   Bacteroides fragilis NOT DETECTED NOT DETECTED Final   Enterobacterales NOT DETECTED NOT DETECTED Final   Enterobacter cloacae complex NOT DETECTED NOT DETECTED Final   Escherichia coli NOT DETECTED NOT DETECTED Final   Klebsiella aerogenes NOT DETECTED NOT DETECTED Final   Klebsiella oxytoca NOT DETECTED NOT DETECTED Final   Klebsiella pneumoniae NOT DETECTED NOT DETECTED Final   Proteus species NOT DETECTED NOT DETECTED Final   Salmonella species NOT DETECTED NOT DETECTED Final   Serratia marcescens NOT DETECTED NOT DETECTED Final   Haemophilus influenzae NOT DETECTED NOT DETECTED Final   Neisseria meningitidis NOT DETECTED NOT DETECTED Final   Pseudomonas aeruginosa NOT DETECTED NOT DETECTED Final   Stenotrophomonas maltophilia NOT DETECTED NOT DETECTED Final   Candida albicans NOT DETECTED NOT DETECTED Final   Candida auris NOT DETECTED NOT DETECTED Final   Candida glabrata NOT DETECTED NOT DETECTED  Final   Candida krusei NOT DETECTED NOT DETECTED Final   Candida parapsilosis NOT DETECTED NOT DETECTED Final   Candida tropicalis NOT DETECTED NOT DETECTED Final   Cryptococcus neoformans/gattii NOT DETECTED NOT DETECTED Final    Comment: Performed at [facility], [address] [place], [place] [address]  Urine Culture     Status: Abnormal   Collection Time: 06/19/21  9:45 PM   Specimen: In/Out Cath Urine  Result Value Ref Range Status   Specimen Description   Final    IN/OUT CATH URINE Performed at [facility], [address] [place], [place] [address]    Special Requests   Final    NONE Performed at [facility], [address] [place], [place] [address]    Culture >=100,000 COLONIES/mL ESCHERICHIA COLI (A)  Final   Report Status 06/22/2021 FINAL  Final   Organism ID, Bacteria ESCHERICHIA COLI (A)  Final      Susceptibility   Escherichia coli - MIC*    AMPICILLIN >=32 RESISTANT Resistant     CEFAZOLIN <=4 SENSITIVE Sensitive     CEFEPIME <=0.12 SENSITIVE Sensitive     CEFTRIAXONE <=0.25 SENSITIVE Sensitive     CIPROFLOXACIN <=0.25 SENSITIVE Sensitive     GENTAMICIN >=16 RESISTANT Resistant     IMIPENEM <=0.25 SENSITIVE Sensitive     NITROFURANTOIN <=16 SENSITIVE Sensitive     TRIMETH/SULFA >=320 RESISTANT Resistant     AMPICILLIN/SULBACTAM >=32 RESISTANT Resistant     PIP/TAZO <=4 SENSITIVE Sensitive     * >=100,000 COLONIES/mL ESCHERICHIA COLI  Blood Culture (routine x 2)     Status: None (Preliminary result)   Collection Time: 06/19/21 11:00 PM   Specimen: BLOOD  Result Value Ref Range Status   Specimen Description BLOOD LEFT HAND  Final   Special Requests IN PEDIATRIC BOTTLE Blood Culture adequate volume  Final   Culture   Final    NO GROWTH 3 DAYS Performed at [facility], [address] [place], [place] [address]    Report Status PENDING  Incomplete    Time coordinating discharge: Over 30 minutes  Richarda Osmond, MD  [facility] 06/22/2021, 9:35 AM

## 2021-06-22 NOTE — Progress Notes (Signed)
Discharge instructions reviewed with patient and family at bedside. All questions answered. Patient discharged home with family. Orma Flaming, RN

## 2021-06-23 DIAGNOSIS — F419 Anxiety disorder, unspecified: Secondary | ICD-10-CM | POA: Diagnosis not present

## 2021-06-23 DIAGNOSIS — I129 Hypertensive chronic kidney disease with stage 1 through stage 4 chronic kidney disease, or unspecified chronic kidney disease: Secondary | ICD-10-CM | POA: Diagnosis not present

## 2021-06-23 DIAGNOSIS — D631 Anemia in chronic kidney disease: Secondary | ICD-10-CM | POA: Diagnosis not present

## 2021-06-23 DIAGNOSIS — D509 Iron deficiency anemia, unspecified: Secondary | ICD-10-CM | POA: Diagnosis not present

## 2021-06-23 DIAGNOSIS — K922 Gastrointestinal hemorrhage, unspecified: Secondary | ICD-10-CM | POA: Diagnosis not present

## 2021-06-23 DIAGNOSIS — N390 Urinary tract infection, site not specified: Secondary | ICD-10-CM | POA: Diagnosis not present

## 2021-06-23 DIAGNOSIS — M069 Rheumatoid arthritis, unspecified: Secondary | ICD-10-CM | POA: Diagnosis not present

## 2021-06-23 DIAGNOSIS — A419 Sepsis, unspecified organism: Secondary | ICD-10-CM | POA: Diagnosis not present

## 2021-06-23 DIAGNOSIS — M1990 Unspecified osteoarthritis, unspecified site: Secondary | ICD-10-CM | POA: Diagnosis not present

## 2021-06-23 DIAGNOSIS — H259 Unspecified age-related cataract: Secondary | ICD-10-CM | POA: Diagnosis not present

## 2021-06-23 DIAGNOSIS — Z8673 Personal history of transient ischemic attack (TIA), and cerebral infarction without residual deficits: Secondary | ICD-10-CM | POA: Diagnosis not present

## 2021-06-23 DIAGNOSIS — K449 Diaphragmatic hernia without obstruction or gangrene: Secondary | ICD-10-CM | POA: Diagnosis not present

## 2021-06-23 DIAGNOSIS — K648 Other hemorrhoids: Secondary | ICD-10-CM | POA: Diagnosis not present

## 2021-06-23 DIAGNOSIS — G4700 Insomnia, unspecified: Secondary | ICD-10-CM | POA: Diagnosis not present

## 2021-06-23 DIAGNOSIS — U071 COVID-19: Secondary | ICD-10-CM | POA: Diagnosis not present

## 2021-06-23 DIAGNOSIS — B9620 Unspecified Escherichia coli [E. coli] as the cause of diseases classified elsewhere: Secondary | ICD-10-CM | POA: Diagnosis not present

## 2021-06-23 DIAGNOSIS — I720 Aneurysm of carotid artery: Secondary | ICD-10-CM | POA: Diagnosis not present

## 2021-06-23 DIAGNOSIS — M810 Age-related osteoporosis without current pathological fracture: Secondary | ICD-10-CM | POA: Diagnosis not present

## 2021-06-23 DIAGNOSIS — Z86718 Personal history of other venous thrombosis and embolism: Secondary | ICD-10-CM | POA: Diagnosis not present

## 2021-06-23 DIAGNOSIS — E785 Hyperlipidemia, unspecified: Secondary | ICD-10-CM | POA: Diagnosis not present

## 2021-06-23 DIAGNOSIS — N1832 Chronic kidney disease, stage 3b: Secondary | ICD-10-CM | POA: Diagnosis not present

## 2021-06-23 DIAGNOSIS — F0390 Unspecified dementia without behavioral disturbance: Secondary | ICD-10-CM | POA: Diagnosis not present

## 2021-06-24 LAB — CULTURE, BLOOD (ROUTINE X 2)
Culture: NO GROWTH
Special Requests: ADEQUATE

## 2021-06-26 ENCOUNTER — Telehealth: Payer: Self-pay

## 2021-06-26 NOTE — Telephone Encounter (Signed)
Pt's daughter notified of Dr. Marliss Coots comments and instructions and verbalized understanding. F/u appt scheduled for next week. GO address provided

## 2021-06-26 NOTE — Telephone Encounter (Signed)
[place] Night - Client TELEPHONE ADVICE RECORD [facility] Patient Name: Wendy Krueger Gender: Female DOB: June 13, 1930 Age: 87 Y 41 M 29 D Return Phone Number: [phone] (Primary), [phone] (Secondary) Address: [place]  [address] Client [facility] [place] Night - Client Client Site [place] Provider Wendy Krueger, Wendy Krueger - MD Contact Type Call Who Is Calling Patient / Member / Family / Caregiver Call Type Triage / Clinical Caller Name Wendy Krueger Relationship To Patient Daughter Return Phone Number [phone] (Primary) Chief Complaint Prescription Refill or Medication Request (non symptomatic) Reason for Call Medication Question / Request Initial Comment Caller states her mom was sent home from hospital. Home care came out today and they were told they will no longer be giving her medication for blood pressure even though she has been taking it for years. Wants to know why they will be stopping the medication. Today blood pressure was 138/86 Translation No Nurse Assessment Nurse: Wendy Moores, RN, Wendy Krueger Date/Time (Eastern Time): 06/23/2021 5:58:31 PM Confirm and document reason for call. If symptomatic, describe symptoms. ---Caller states patient was discharged from the hospital yesterday, was in for COVID and UTI. States the discharge instructions were to stop patient's BP meds. States BP 138/86. Meds are HCTZ, Losartan. States no new or worsening symptoms at this time. Does the patient have any new or worsening symptoms? ---No Please document clinical information provided and list any resource used. ---Will contact on call provider for recommendations. Disp. Time Wendy Krueger Time) Disposition Final User 06/23/2021 5:04:48 PM Send To Nurse Wendy Farber, RN, Wendy Krueger 06/23/2021 6:03:13 PM Paged On Call back to [facility], [place], Wendy Krueger 06/23/2021 6:03:48 PM Paged  On Call back to [facility], [place], Wendy Krueger 06/23/2021 6:03:37 PM Clinical Call Yes Wendy Moores, RN, Wendy Krueger PLEASE NOTE: All timestamps contained within this report are represented as [place] Standard Time. CONFIDENTIALTY NOTICE: This fax transmission is intended only for the addressee. It contains information that is legally privileged, confidential or otherwise protected from use or disclosure. If you are not the intended recipient, you are strictly prohibited from reviewing, disclosing, copying using or disseminating any of this information or taking any action in reliance on or regarding this information. If you have received this fax in error, please notify us immediately by telephone so that we can arrange for its return to [place]. Phone: [phone], Toll-Free: [phone], Fax: [phone] Page: 2 of 2 Call Id: [id number] Comments User: Wendy Repress, RN Date/Time Wendy Krueger Time): 06/23/2021 6:15:13 PM Called patient's dauighter back and relayed on call provider's recommendations to her: On call provider recommends Pt/Home health monitor pt BP and if it becomes elevated, call back. Hold BP meds for now. Otherwise follow up with the office on Tuesday. Paging DoctorName Phone DateTime Result/ Outcome Message Type Notes Wendy Krueger- MD [phone] 06/23/2021 6:03:05 PM Paged On Call Back to Call Center Doctor Paged [phone] Wendy Krueger- MD 06/23/2021 6:12:16 PM Spoke with On Call - General Message Result Relayed to on call provider that pt BP meds were D/C'd by hospital on discharge. On call provider recommends Pt/Home health monitor pt BP and if it becomes elevated, call back. Otherwise follow up with the office on Tuesda

## 2021-06-26 NOTE — Telephone Encounter (Signed)
Her bp meds were held in the hospital (hctz, losartan with added K) due to normal to low bp  If her bp is starting to go up-can re start losartan but hold off on the hctz for now (goal to get her hydrated)   Follow up in a week or so if she is able  Thanks for the update

## 2021-06-26 NOTE — Telephone Encounter (Signed)
I spoke with [name] (DPR signed); pts BP on 06/25/21 was 143/80 last night.pt is not on  any BP meds.  Pt is still resting today so no BP taken so far. No H/A, no dizziness, no CP or SOB. Pt having difficulty walking and standing but pts daughter said was like that in hospital and she is sure they cked pt for possible stroke and [name] said pt did not have a stroke. Pt had sepsis; [name] said pt tested + for covid on 06/20/21. Pt still has slight dry cough.No fever. No distress with breathing. [name] said pt was to have HFU within 2 wks after d/c on 06/22/21. [name] wants to know if pt should be seen sooner than 2 wks. [name] request cb after reviewed by Dr Glori Bickers. Sending note to DR [employer] and [place]  MA and will teams [place].

## 2021-06-27 ENCOUNTER — Other Ambulatory Visit: Payer: Self-pay | Admitting: Family Medicine

## 2021-06-27 DIAGNOSIS — B9620 Unspecified Escherichia coli [E. coli] as the cause of diseases classified elsewhere: Secondary | ICD-10-CM | POA: Diagnosis not present

## 2021-06-27 DIAGNOSIS — I129 Hypertensive chronic kidney disease with stage 1 through stage 4 chronic kidney disease, or unspecified chronic kidney disease: Secondary | ICD-10-CM | POA: Diagnosis not present

## 2021-06-27 DIAGNOSIS — N1832 Chronic kidney disease, stage 3b: Secondary | ICD-10-CM | POA: Diagnosis not present

## 2021-06-27 DIAGNOSIS — A419 Sepsis, unspecified organism: Secondary | ICD-10-CM | POA: Diagnosis not present

## 2021-06-27 DIAGNOSIS — U071 COVID-19: Secondary | ICD-10-CM | POA: Diagnosis not present

## 2021-06-27 DIAGNOSIS — N390 Urinary tract infection, site not specified: Secondary | ICD-10-CM | POA: Diagnosis not present

## 2021-06-27 NOTE — Telephone Encounter (Signed)
Refill request asking for Rx to be changed to a 90 day Rx. Last filled on 04/30/21 #60 tabs with 3 refills.

## 2021-06-28 ENCOUNTER — Telehealth: Payer: Self-pay | Admitting: Family Medicine

## 2021-06-28 DIAGNOSIS — B9620 Unspecified Escherichia coli [E. coli] as the cause of diseases classified elsewhere: Secondary | ICD-10-CM | POA: Diagnosis not present

## 2021-06-28 DIAGNOSIS — N1832 Chronic kidney disease, stage 3b: Secondary | ICD-10-CM | POA: Diagnosis not present

## 2021-06-28 DIAGNOSIS — I129 Hypertensive chronic kidney disease with stage 1 through stage 4 chronic kidney disease, or unspecified chronic kidney disease: Secondary | ICD-10-CM | POA: Diagnosis not present

## 2021-06-28 DIAGNOSIS — A419 Sepsis, unspecified organism: Secondary | ICD-10-CM | POA: Diagnosis not present

## 2021-06-28 DIAGNOSIS — U071 COVID-19: Secondary | ICD-10-CM | POA: Diagnosis not present

## 2021-06-28 DIAGNOSIS — N390 Urinary tract infection, site not specified: Secondary | ICD-10-CM | POA: Diagnosis not present

## 2021-06-28 NOTE — Telephone Encounter (Signed)
Please ok those verbal orders  

## 2021-06-28 NOTE — Telephone Encounter (Signed)
Home Health verbal orders Parch[place] Name: [place] number: [phone]  Requesting OT/PT/Skilled nursing/Social Work/Speech:  Reason:PT  Frequency:1 wk for 1 wk 2 wk for 3 wks  1 wk for 5 wks  Please forward to [facility] pool or providers CMA

## 2021-06-29 DIAGNOSIS — B9620 Unspecified Escherichia coli [E. coli] as the cause of diseases classified elsewhere: Secondary | ICD-10-CM | POA: Diagnosis not present

## 2021-06-29 DIAGNOSIS — A419 Sepsis, unspecified organism: Secondary | ICD-10-CM | POA: Diagnosis not present

## 2021-06-29 DIAGNOSIS — N390 Urinary tract infection, site not specified: Secondary | ICD-10-CM | POA: Diagnosis not present

## 2021-06-29 DIAGNOSIS — I129 Hypertensive chronic kidney disease with stage 1 through stage 4 chronic kidney disease, or unspecified chronic kidney disease: Secondary | ICD-10-CM | POA: Diagnosis not present

## 2021-06-29 DIAGNOSIS — N1832 Chronic kidney disease, stage 3b: Secondary | ICD-10-CM | POA: Diagnosis not present

## 2021-06-29 DIAGNOSIS — U071 COVID-19: Secondary | ICD-10-CM | POA: Diagnosis not present

## 2021-06-29 NOTE — Telephone Encounter (Signed)
VO given to [place]

## 2021-07-03 ENCOUNTER — Encounter: Payer: Self-pay | Admitting: Family Medicine

## 2021-07-03 ENCOUNTER — Ambulatory Visit (INDEPENDENT_AMBULATORY_CARE_PROVIDER_SITE_OTHER): Payer: Medicare Other | Admitting: Family Medicine

## 2021-07-03 ENCOUNTER — Other Ambulatory Visit: Payer: Self-pay

## 2021-07-03 VITALS — BP 136/82 | HR 84 | Temp 97.3°F | Ht 60.0 in | Wt 161.2 lb

## 2021-07-03 DIAGNOSIS — K649 Unspecified hemorrhoids: Secondary | ICD-10-CM

## 2021-07-03 DIAGNOSIS — I10 Essential (primary) hypertension: Secondary | ICD-10-CM

## 2021-07-03 DIAGNOSIS — D500 Iron deficiency anemia secondary to blood loss (chronic): Secondary | ICD-10-CM | POA: Diagnosis not present

## 2021-07-03 DIAGNOSIS — N3000 Acute cystitis without hematuria: Secondary | ICD-10-CM | POA: Diagnosis not present

## 2021-07-03 DIAGNOSIS — U071 COVID-19: Secondary | ICD-10-CM | POA: Diagnosis not present

## 2021-07-03 MED ORDER — LOSARTAN POTASSIUM 100 MG PO TABS: 100.0000 mg | ORAL_TABLET | Freq: Every day | ORAL | 0 refills | Status: DC

## 2021-07-03 MED ORDER — POTASSIUM CHLORIDE ER 10 MEQ PO TBCR: 10.0000 meq | EXTENDED_RELEASE_TABLET | Freq: Every day | ORAL | 0 refills | Status: DC

## 2021-07-03 MED ORDER — HYDROCHLOROTHIAZIDE 25 MG PO TABS: 25.0000 mg | ORAL_TABLET | Freq: Every day | ORAL | 0 refills | Status: DC

## 2021-07-03 NOTE — Assessment & Plan Note (Signed)
bp meds were held in hospital with sepsis and now starting to rebound  Still taking metoprolol 25 mg bid Just started back losartan 100 mg qd Now that she is taking fluids can go back on hctz 25 mg daily  Family will track her bp and general progress and update BP: 136/82  Stable renal panel

## 2021-07-03 NOTE — Progress Notes (Signed)
Subjective:    Patient ID: Wendy Krueger, female    DOB: [birth year], 88 y.o.   MRN: [id number]  This visit occurred during the SARS-CoV-2 public health emergency.  Safety protocols were in place, including screening questions prior to the visit, additional usage of staff PPE, and extensive cleaning of exam room while observing appropriate contact time as indicated for disinfecting solutions.   HPI Pt presents for f/u of hospitalization   Wt Readings from Last 3 Encounters:  07/03/21 161 lb 4 oz (73.1 kg)  06/19/21 149 lb 14.6 oz (68 kg)  05/21/21 150 lb (68 kg)   31.49 kg/m  Pt was admitted from 12/20 to 06/22/21 for covid 19 and uti Would not walk at home  She presented with encephalopathy (sepsis diagnosed by elevated lactic acid)  Ua was pos for leuk and nitrates - tx initially with cefriaxone 1g IV   Improved with antibiotics (IV) and fluid support  Tussionex for cough  SNF was recommended but family chose to care for her at home with good support   Blood cultures returned neg  Urine culture pos for e coli (resistant to gent and sulfa)  CT head and cxr reassuring  CT HEAD WO CONTRAST (5MM)  Result Date: 06/19/2021 CLINICAL DATA:  Altered mental status. EXAM: CT HEAD WITHOUT CONTRAST TECHNIQUE: Contiguous axial images were obtained from the base of the skull through the vertex without intravenous contrast. COMPARISON:  January 18, 2019 FINDINGS: Brain: There is moderate severity cerebral atrophy with widening of the extra-axial spaces and ventricular dilatation. There are areas of decreased attenuation within the white matter tracts of the supratentorial brain, consistent with microvascular disease changes. Vascular: Marked severity arterial calcification is seen along the skull base. Skull: Normal. Negative for fracture or focal lesion. Sinuses/Orbits: No acute finding. Other: None. IMPRESSION: 1. Generalized cerebral atrophy. 2. No acute intracranial abnormality.  Electronically Signed   By: Virgina Norfolk M.D.   On: 06/19/2021 21:37   DG Chest Port 1 View  Result Date: 06/19/2021 CLINICAL DATA:  Possible sepsis EXAM: PORTABLE CHEST 1 VIEW COMPARISON:  12/07/2019 FINDINGS: Cardiac shadow is enlarged but stable. Tortuous thoracic aorta with calcifications is not seen. Lungs are clear bilaterally. No focal infiltrate or effusion is noted. No bony abnormality is seen. IMPRESSION: No active disease. Electronically Signed   By: [name] M.D.   On: 06/19/2021 21:12     Lab Results  Component Value Date   CREATININE 1.02 (H) 06/22/2021   BUN 22 06/22/2021   NA 139 06/22/2021   K 3.9 06/22/2021   CL 109 06/22/2021   CO2 27 06/22/2021   Lab Results  Component Value Date   ALT 10 06/19/2021   AST 23 06/19/2021   ALKPHOS 68 06/19/2021   BILITOT 0.7 06/19/2021   Lactic acid 3.5 Lab Results  Component Value Date   WBC 3.5 (L) 06/21/2021   HGB 11.8 (L) 06/21/2021   HCT 35.7 (L) 06/21/2021   MCV 92.5 06/21/2021   PLT 117 (L) 06/21/2021   HTN BP Readings from Last 3 Encounters:  07/03/21 136/82  06/22/21 (!) 148/68  07/05/20 (!) 142/86   Her bp meds were held in the hospital  Adv upon return to start back on losartan tub keep holding hctz for now  Hydration status   Also takes metoprolol 25 mg bid  Home health is coming out (PT and OT and nurse)   A little rectal bleeding with the heparin   Now  Tired/sleeping  a lot  No other complaints  Is eating /not great appetite  No sob  02 sat is 95%  It is hard to get her to drink  Flavored water and gatorade  Hard to tell if she urinates well/if drinking well she goes well   Patient Active Problem List   Diagnosis Date Noted   SOB (shortness of breath) 06/20/2021   Acute cystitis without hematuria    COVID    Closed right hip fracture ([place]) 12/07/2019   Pedal edema 08/13/2019   Polyp of colon    Gastrointestinal hemorrhage    Chronic renal failure, stage 3b ([place])    DVT of  lower extremity, bilateral ([place]) 08/06/2019   Bleeding hemorrhoids 08/06/2019   DVT, lower extremity, distal, acute, bilateral ([place]) 08/06/2019   Advanced dementia 08/02/2019   Abnormal CT of the abdomen    External hemorrhoids    Transient alteration of awareness 07/14/2018   Chronic diarrhea 02/17/2018   History of fracture of radius 02/17/2018   Hypokalemia 02/09/2018   Hypotension 02/09/2018   History of lower GI bleeding 02/09/2018   Acute blood loss anemia 02/09/2018   History of CVA (cerebrovascular accident) 03/10/2016   Carotid aneurysm, right ([place]) 02/21/2016   Lipoma of shoulder 03/27/2015   History of closed Colles' fracture 09/06/2014   Allergic rhinitis 67/06/4579   Diastolic dysfunction 99/83/3825   Iron deficiency anemia 03/25/2014   History of GI bleed 03/21/2014   BRBPR (bright red blood per rectum) 03/19/2014   Anxiety 09/01/2013   Encounter for Medicare annual wellness exam 01/08/2013   Gallstones 11/11/2011   Arterial ischemic stroke, 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   Risk for falls 06/04/2011   Constipation 03/05/2011   Vitamin D deficiency 03/28/2009   HYPERCHOLESTEROLEMIA, PURE 03/17/2007   Essential hypertension 03/16/2007   OSTEOARTHRITIS 03/16/2007   Osteoporosis 03/16/2007   Past Medical History:  Diagnosis Date   Asymptomatic gallstones    BRBPR (bright red blood per rectum)    Cataract 04/13/2018   bilateral eyes   Cholelithiasis    Clot    RLE blood clots; s/p IVC filter   Colitis - presumed infectious origin    one ER visit    Dementia ([place])    Duodenitis    Fall    Gastritis    GI bleed    Hemorrhoid    bleeding   Hiatal hernia    Hyperlipidemia    Hypertension    Osteoarthritis    Osteoporosis    Stroke ([place])    Syncope    Past Surgical History:  Procedure Laterality Date   ABDOMINAL HYSTERECTOMY     BSO- fibroids   ABI's     normal   ANTERIOR APPROACH HEMI HIP ARTHROPLASTY Right 12/08/2019   Procedure: ANTERIOR APPROACH HEMI HIP  ARTHROPLASTY;  Surgeon: Rod Can, MD;  Location: [facility];  Service: Orthopedics;  Laterality: Right;   COLONOSCOPY  08/09/2019   Procedure: COLONOSCOPY;  Surgeon: Lucilla Lame, MD;  Location: [facility] ENDOSCOPY;  Service: Endoscopy;;   dexa  07/2003   osteoporosis   ESOPHAGOGASTRODUODENOSCOPY N/A 03/21/2014   Procedure: ESOPHAGOGASTRODUODENOSCOPY (EGD);  Surgeon: Milus Banister, MD;  Location: [place];  Service: Endoscopy;  Laterality: N/A;   FLEXIBLE SIGMOIDOSCOPY N/A 08/08/2019   Procedure: FLEXIBLE SIGMOIDOSCOPY;  Surgeon: Jonathon Bellows, MD;  Location: [facility] ENDOSCOPY;  Service: Gastroenterology;  Laterality: N/A;   FLEXIBLE SIGMOIDOSCOPY N/A 08/09/2019   Procedure: FLEXIBLE SIGMOIDOSCOPY;  Surgeon: Lucilla Lame, MD;  Location: [facility] ENDOSCOPY;  Service: Endoscopy;  Laterality: N/A;  HIP FRACTURE SURGERY     IR IVC FILTER PLMT / S&I /IMG GUID/MOD SED  07/06/2019   IVC FILTER INSERTION  07/2019   left foot brace     WRIST FRACTURE SURGERY  08/1999   R arm   WRIST FRACTURE SURGERY Left 02/12/2018   Social History   Tobacco Use   Smoking status: Never   Smokeless tobacco: Never  Vaping Use   Vaping Use: Never used  Substance Use Topics   Alcohol use: No    Alcohol/week: 0.0 standard drinks   Drug use: No   Family History  Problem Relation Age of Onset   Heart attack Father    Hypertension Father    Heart attack Mother    Throat cancer Brother    Breast cancer Daughter    Allergies  Allergen Reactions   Alendronate Sodium Other (See Comments)    Leg pain    Amlodipine Besylate Hives   Calcitonin ([place]) Other (See Comments)     headache/ head pressure   Plavix [Clopidogrel Bisulfate] Other (See Comments)    drowsy     Simvastatin Other (See Comments)    Leg pain    Current Outpatient Medications on File Prior to Visit  Medication Sig Dispense Refill   acetaminophen (TYLENOL) 500 MG tablet Take 500 mg by mouth every 6 (six) hours as needed.     Ascorbic Acid  (VITAMIN C) 1000 MG tablet Take 1,000 mg by mouth daily.     Chlorpheniramine Maleate (ALLERGY PO) Take 1 tablet by mouth daily.      donepezil (ARICEPT) 10 MG tablet TAKE 1 TABLET BY MOUTH AT BEDTIME NEEDS APPT FOR REFILLS 60 tablet 4   iron polysaccharides (NIFEREX) 150 MG capsule TAKE 1 CAPSULE (150 MG TOTAL) BY MOUTH EVERY OTHER DAY. 45 capsule 1   Melatonin 10 MG TABS Take 10 mg by mouth at bedtime.     memantine (NAMENDA) 10 MG tablet TAKE 1 TABLET BY MOUTH TWICE A DAY 180 tablet 4   metoprolol tartrate (LOPRESSOR) 25 MG tablet TAKE 1 TABLET BY MOUTH TWICE A DAY 180 tablet 0   Multiple Vitamins-Minerals ([employer] SPECTRAVITE PO) Take 1 capsule by mouth daily.     polyethylene glycol (MIRALAX / GLYCOLAX) 17 g packet Take 17 g by mouth daily. (Patient taking differently: Take 17 g by mouth daily as needed.) 30 each 0   Probiotic Product (PROBIOTIC PO) Take 1 tablet by mouth every other day.      QUEtiapine (SEROQUEL) 25 MG tablet TAKE 1/2-1 TABLET BY MOUTH AS NEEDED IN THE EVENING FOR AGITATION/HALLUCINATIONS 90 tablet 3   vitamin B-12 (CYANOCOBALAMIN) 1000 MCG tablet Take 1,000 mcg by mouth daily.     No current facility-administered medications on file prior to visit.     Review of Systems  Constitutional:  Positive for fatigue. Negative for activity change, appetite change, fever and unexpected weight change.  HENT:  Negative for congestion, ear pain, rhinorrhea, sinus pressure and sore throat.   Eyes:  Negative for pain, redness and visual disturbance.  Respiratory:  Negative for cough, shortness of breath and wheezing.   Cardiovascular:  Negative for chest pain and palpitations.  Gastrointestinal:  Positive for constipation. Negative for abdominal pain, blood in stool and diarrhea.  Endocrine: Negative for polydipsia and polyuria.  Genitourinary:  Negative for dysuria, frequency and urgency.  Musculoskeletal:  Negative for arthralgias, back pain and myalgias.  Skin:  Negative for pallor  and rash.  Allergic/Immunologic: Negative for environmental  allergies.  Neurological:  Negative for dizziness, syncope and headaches.  Hematological:  Negative for adenopathy. Does not bruise/bleed easily.  Psychiatric/Behavioral:  Positive for confusion. Negative for decreased concentration and dysphoric mood. The patient is not nervous/anxious.       Objective:   Physical Exam Constitutional:      General: She is not in acute distress.    Appearance: Normal appearance. She is well-developed. She is obese. She is not ill-appearing.     Comments: Sleeping on and off in wheelchair  HENT:     Head: Normocephalic and atraumatic.     Mouth/Throat:     Mouth: Mucous membranes are moist.  Eyes:     General: No scleral icterus.    Conjunctiva/sclera: Conjunctivae normal.     Pupils: Pupils are equal, round, and reactive to light.  Neck:     Thyroid: No thyromegaly.     Vascular: No JVD.  Cardiovascular:     Rate and Rhythm: Normal rate and regular rhythm.     Heart sounds: Normal heart sounds.    No gallop.  Pulmonary:     Effort: Pulmonary effort is normal. No respiratory distress.     Breath sounds: Normal breath sounds. No wheezing or rales.  Abdominal:     General: Bowel sounds are normal. There is no distension or abdominal bruit.     Palpations: Abdomen is soft. There is no mass.     Tenderness: There is no abdominal tenderness. There is no guarding or rebound.     Comments: Non tender abdomen/sitting for exam  Musculoskeletal:     Cervical back: Normal range of motion and neck supple. No tenderness.     Right lower leg: No edema.     Left lower leg: No edema.  Lymphadenopathy:     Cervical: No cervical adenopathy.  Skin:    General: Skin is warm and dry.     Coloration: Skin is not pale.     Findings: No bruising or rash.  Neurological:     Mental Status: She is alert.     Coordination: Coordination normal.     Deep Tendon Reflexes: Reflexes are normal and symmetric.  Reflexes normal.  Psychiatric:        Mood and Affect: Mood normal.          Assessment & Plan:   Problem List Items Addressed This Visit       Cardiovascular and Mediastinum   Essential hypertension (Chronic)    bp meds were held in hospital with sepsis and now starting to rebound  Still taking metoprolol 25 mg bid Just started back losartan 100 mg qd Now that she is taking fluids can go back on hctz 25 mg daily  Family will track her bp and general progress and update BP: 136/82  Stable renal panel      Relevant Medications   hydrochlorothiazide (HYDRODIURIL) 25 MG tablet   losartan (COZAAR) 100 MG tablet   Bleeding hemorrhoids    Worsened with recent constipation and short term heparin for DVT prevention  Family will watch closely and update Continue iron as is If worse may need to re check cbc      Relevant Medications   hydrochlorothiazide (HYDRODIURIL) 25 MG tablet   losartan (COZAAR) 100 MG tablet     Genitourinary   Acute cystitis without hematuria - Primary    S/p hospitalization for this and covid 19 Reviewed hospital records, lab results and studies in detail  Much improved with  fluids and abx PT/OT to start at home -plan to proceed with this  Discussed goal of increased fluid intake          Other   Iron deficiency anemia    Hb in hospital 11.8, stable  Some more hemorrhoidal bleeding now, suspect will be temporary  Continue to monitor  Continue current oral iron       COVID    Recent hosp for this and uti with sepsis  Reviewed hospital records, lab results and studies in detail   Much improved but weak/tired (also in setting of advanced dementia) PT/OT to start at home  Encouraged a good fluid intake  Family is able to care for her well so far

## 2021-07-03 NOTE — Assessment & Plan Note (Signed)
Worsened with recent constipation and short term heparin for DVT prevention  Family will watch closely and update Continue iron as is If worse may need to re check cbc

## 2021-07-03 NOTE — Assessment & Plan Note (Signed)
Hb in hospital 11.8, stable  Some more hemorrhoidal bleeding now, suspect will be temporary  Continue to monitor  Continue current oral iron

## 2021-07-03 NOTE — Assessment & Plan Note (Signed)
S/p hospitalization for this and covid 19 Reviewed hospital records, lab results and studies in detail  Much improved with fluids and abx PT/OT to start at home -plan to proceed with this  Discussed goal of increased fluid intake

## 2021-07-03 NOTE — Patient Instructions (Addendum)
Continue encouraging fluids  You can try some miralax as needed for constipation  Watch for urinary symptoms or any other changes   Watch the hemorrhoid bleeding, keep [place] posted  Continue the iron   I suspect the return of energy will take a while Continue therapy at home

## 2021-07-03 NOTE — Assessment & Plan Note (Signed)
Recent hosp for this and uti with sepsis  Reviewed hospital records, lab results and studies in detail   Much improved but weak/tired (also in setting of advanced dementia) PT/OT to start at home  Encouraged a good fluid intake  Family is able to care for her well so far

## 2021-07-04 DIAGNOSIS — N1832 Chronic kidney disease, stage 3b: Secondary | ICD-10-CM | POA: Diagnosis not present

## 2021-07-04 DIAGNOSIS — I129 Hypertensive chronic kidney disease with stage 1 through stage 4 chronic kidney disease, or unspecified chronic kidney disease: Secondary | ICD-10-CM | POA: Diagnosis not present

## 2021-07-04 DIAGNOSIS — N390 Urinary tract infection, site not specified: Secondary | ICD-10-CM | POA: Diagnosis not present

## 2021-07-04 DIAGNOSIS — U071 COVID-19: Secondary | ICD-10-CM | POA: Diagnosis not present

## 2021-07-04 DIAGNOSIS — A419 Sepsis, unspecified organism: Secondary | ICD-10-CM | POA: Diagnosis not present

## 2021-07-04 DIAGNOSIS — B9620 Unspecified Escherichia coli [E. coli] as the cause of diseases classified elsewhere: Secondary | ICD-10-CM | POA: Diagnosis not present

## 2021-07-06 DIAGNOSIS — A419 Sepsis, unspecified organism: Secondary | ICD-10-CM | POA: Diagnosis not present

## 2021-07-06 DIAGNOSIS — N390 Urinary tract infection, site not specified: Secondary | ICD-10-CM | POA: Diagnosis not present

## 2021-07-06 DIAGNOSIS — U071 COVID-19: Secondary | ICD-10-CM | POA: Diagnosis not present

## 2021-07-06 DIAGNOSIS — N1832 Chronic kidney disease, stage 3b: Secondary | ICD-10-CM | POA: Diagnosis not present

## 2021-07-06 DIAGNOSIS — B9620 Unspecified Escherichia coli [E. coli] as the cause of diseases classified elsewhere: Secondary | ICD-10-CM | POA: Diagnosis not present

## 2021-07-06 DIAGNOSIS — I129 Hypertensive chronic kidney disease with stage 1 through stage 4 chronic kidney disease, or unspecified chronic kidney disease: Secondary | ICD-10-CM | POA: Diagnosis not present

## 2021-07-07 DIAGNOSIS — G4700 Insomnia, unspecified: Secondary | ICD-10-CM

## 2021-07-07 DIAGNOSIS — K922 Gastrointestinal hemorrhage, unspecified: Secondary | ICD-10-CM

## 2021-07-07 DIAGNOSIS — M1990 Unspecified osteoarthritis, unspecified site: Secondary | ICD-10-CM | POA: Diagnosis not present

## 2021-07-07 DIAGNOSIS — M069 Rheumatoid arthritis, unspecified: Secondary | ICD-10-CM | POA: Diagnosis not present

## 2021-07-07 DIAGNOSIS — K648 Other hemorrhoids: Secondary | ICD-10-CM

## 2021-07-07 DIAGNOSIS — N390 Urinary tract infection, site not specified: Secondary | ICD-10-CM | POA: Diagnosis not present

## 2021-07-07 DIAGNOSIS — D509 Iron deficiency anemia, unspecified: Secondary | ICD-10-CM | POA: Diagnosis not present

## 2021-07-07 DIAGNOSIS — I129 Hypertensive chronic kidney disease with stage 1 through stage 4 chronic kidney disease, or unspecified chronic kidney disease: Secondary | ICD-10-CM | POA: Diagnosis not present

## 2021-07-07 DIAGNOSIS — B9620 Unspecified Escherichia coli [E. coli] as the cause of diseases classified elsewhere: Secondary | ICD-10-CM | POA: Diagnosis not present

## 2021-07-07 DIAGNOSIS — Z86718 Personal history of other venous thrombosis and embolism: Secondary | ICD-10-CM

## 2021-07-07 DIAGNOSIS — F419 Anxiety disorder, unspecified: Secondary | ICD-10-CM

## 2021-07-07 DIAGNOSIS — I720 Aneurysm of carotid artery: Secondary | ICD-10-CM

## 2021-07-07 DIAGNOSIS — K449 Diaphragmatic hernia without obstruction or gangrene: Secondary | ICD-10-CM

## 2021-07-07 DIAGNOSIS — H259 Unspecified age-related cataract: Secondary | ICD-10-CM

## 2021-07-07 DIAGNOSIS — U071 COVID-19: Secondary | ICD-10-CM | POA: Diagnosis not present

## 2021-07-07 DIAGNOSIS — A419 Sepsis, unspecified organism: Secondary | ICD-10-CM | POA: Diagnosis not present

## 2021-07-07 DIAGNOSIS — E785 Hyperlipidemia, unspecified: Secondary | ICD-10-CM | POA: Diagnosis not present

## 2021-07-07 DIAGNOSIS — Z8673 Personal history of transient ischemic attack (TIA), and cerebral infarction without residual deficits: Secondary | ICD-10-CM

## 2021-07-07 DIAGNOSIS — M810 Age-related osteoporosis without current pathological fracture: Secondary | ICD-10-CM | POA: Diagnosis not present

## 2021-07-07 DIAGNOSIS — D631 Anemia in chronic kidney disease: Secondary | ICD-10-CM | POA: Diagnosis not present

## 2021-07-07 DIAGNOSIS — N1832 Chronic kidney disease, stage 3b: Secondary | ICD-10-CM | POA: Diagnosis not present

## 2021-07-07 DIAGNOSIS — F0390 Unspecified dementia without behavioral disturbance: Secondary | ICD-10-CM

## 2021-07-10 DIAGNOSIS — B9620 Unspecified Escherichia coli [E. coli] as the cause of diseases classified elsewhere: Secondary | ICD-10-CM | POA: Diagnosis not present

## 2021-07-10 DIAGNOSIS — N1832 Chronic kidney disease, stage 3b: Secondary | ICD-10-CM | POA: Diagnosis not present

## 2021-07-10 DIAGNOSIS — I129 Hypertensive chronic kidney disease with stage 1 through stage 4 chronic kidney disease, or unspecified chronic kidney disease: Secondary | ICD-10-CM | POA: Diagnosis not present

## 2021-07-10 DIAGNOSIS — U071 COVID-19: Secondary | ICD-10-CM | POA: Diagnosis not present

## 2021-07-10 DIAGNOSIS — A419 Sepsis, unspecified organism: Secondary | ICD-10-CM | POA: Diagnosis not present

## 2021-07-10 DIAGNOSIS — N390 Urinary tract infection, site not specified: Secondary | ICD-10-CM | POA: Diagnosis not present

## 2021-07-13 DIAGNOSIS — I129 Hypertensive chronic kidney disease with stage 1 through stage 4 chronic kidney disease, or unspecified chronic kidney disease: Secondary | ICD-10-CM | POA: Diagnosis not present

## 2021-07-13 DIAGNOSIS — N1832 Chronic kidney disease, stage 3b: Secondary | ICD-10-CM | POA: Diagnosis not present

## 2021-07-13 DIAGNOSIS — A419 Sepsis, unspecified organism: Secondary | ICD-10-CM | POA: Diagnosis not present

## 2021-07-13 DIAGNOSIS — B9620 Unspecified Escherichia coli [E. coli] as the cause of diseases classified elsewhere: Secondary | ICD-10-CM | POA: Diagnosis not present

## 2021-07-13 DIAGNOSIS — N390 Urinary tract infection, site not specified: Secondary | ICD-10-CM | POA: Diagnosis not present

## 2021-07-13 DIAGNOSIS — U071 COVID-19: Secondary | ICD-10-CM | POA: Diagnosis not present

## 2021-07-17 DIAGNOSIS — N390 Urinary tract infection, site not specified: Secondary | ICD-10-CM | POA: Diagnosis not present

## 2021-07-17 DIAGNOSIS — U071 COVID-19: Secondary | ICD-10-CM | POA: Diagnosis not present

## 2021-07-17 DIAGNOSIS — N1832 Chronic kidney disease, stage 3b: Secondary | ICD-10-CM | POA: Diagnosis not present

## 2021-07-17 DIAGNOSIS — I129 Hypertensive chronic kidney disease with stage 1 through stage 4 chronic kidney disease, or unspecified chronic kidney disease: Secondary | ICD-10-CM | POA: Diagnosis not present

## 2021-07-17 DIAGNOSIS — B9620 Unspecified Escherichia coli [E. coli] as the cause of diseases classified elsewhere: Secondary | ICD-10-CM | POA: Diagnosis not present

## 2021-07-17 DIAGNOSIS — A419 Sepsis, unspecified organism: Secondary | ICD-10-CM | POA: Diagnosis not present

## 2021-07-19 DIAGNOSIS — B9620 Unspecified Escherichia coli [E. coli] as the cause of diseases classified elsewhere: Secondary | ICD-10-CM | POA: Diagnosis not present

## 2021-07-19 DIAGNOSIS — I129 Hypertensive chronic kidney disease with stage 1 through stage 4 chronic kidney disease, or unspecified chronic kidney disease: Secondary | ICD-10-CM | POA: Diagnosis not present

## 2021-07-19 DIAGNOSIS — A419 Sepsis, unspecified organism: Secondary | ICD-10-CM | POA: Diagnosis not present

## 2021-07-19 DIAGNOSIS — N390 Urinary tract infection, site not specified: Secondary | ICD-10-CM | POA: Diagnosis not present

## 2021-07-19 DIAGNOSIS — N1832 Chronic kidney disease, stage 3b: Secondary | ICD-10-CM | POA: Diagnosis not present

## 2021-07-19 DIAGNOSIS — U071 COVID-19: Secondary | ICD-10-CM | POA: Diagnosis not present

## 2021-07-23 DIAGNOSIS — K449 Diaphragmatic hernia without obstruction or gangrene: Secondary | ICD-10-CM | POA: Diagnosis not present

## 2021-07-23 DIAGNOSIS — M810 Age-related osteoporosis without current pathological fracture: Secondary | ICD-10-CM | POA: Diagnosis not present

## 2021-07-23 DIAGNOSIS — M069 Rheumatoid arthritis, unspecified: Secondary | ICD-10-CM | POA: Diagnosis not present

## 2021-07-23 DIAGNOSIS — I129 Hypertensive chronic kidney disease with stage 1 through stage 4 chronic kidney disease, or unspecified chronic kidney disease: Secondary | ICD-10-CM | POA: Diagnosis not present

## 2021-07-23 DIAGNOSIS — D509 Iron deficiency anemia, unspecified: Secondary | ICD-10-CM | POA: Diagnosis not present

## 2021-07-23 DIAGNOSIS — E785 Hyperlipidemia, unspecified: Secondary | ICD-10-CM | POA: Diagnosis not present

## 2021-07-23 DIAGNOSIS — Z8673 Personal history of transient ischemic attack (TIA), and cerebral infarction without residual deficits: Secondary | ICD-10-CM | POA: Diagnosis not present

## 2021-07-23 DIAGNOSIS — K922 Gastrointestinal hemorrhage, unspecified: Secondary | ICD-10-CM | POA: Diagnosis not present

## 2021-07-23 DIAGNOSIS — I720 Aneurysm of carotid artery: Secondary | ICD-10-CM | POA: Diagnosis not present

## 2021-07-23 DIAGNOSIS — H259 Unspecified age-related cataract: Secondary | ICD-10-CM | POA: Diagnosis not present

## 2021-07-23 DIAGNOSIS — F419 Anxiety disorder, unspecified: Secondary | ICD-10-CM | POA: Diagnosis not present

## 2021-07-23 DIAGNOSIS — N390 Urinary tract infection, site not specified: Secondary | ICD-10-CM | POA: Diagnosis not present

## 2021-07-23 DIAGNOSIS — K648 Other hemorrhoids: Secondary | ICD-10-CM | POA: Diagnosis not present

## 2021-07-23 DIAGNOSIS — Z86718 Personal history of other venous thrombosis and embolism: Secondary | ICD-10-CM | POA: Diagnosis not present

## 2021-07-23 DIAGNOSIS — N1832 Chronic kidney disease, stage 3b: Secondary | ICD-10-CM | POA: Diagnosis not present

## 2021-07-23 DIAGNOSIS — F0390 Unspecified dementia without behavioral disturbance: Secondary | ICD-10-CM | POA: Diagnosis not present

## 2021-07-23 DIAGNOSIS — M1990 Unspecified osteoarthritis, unspecified site: Secondary | ICD-10-CM | POA: Diagnosis not present

## 2021-07-23 DIAGNOSIS — A419 Sepsis, unspecified organism: Secondary | ICD-10-CM | POA: Diagnosis not present

## 2021-07-23 DIAGNOSIS — G4700 Insomnia, unspecified: Secondary | ICD-10-CM | POA: Diagnosis not present

## 2021-07-23 DIAGNOSIS — U071 COVID-19: Secondary | ICD-10-CM | POA: Diagnosis not present

## 2021-07-23 DIAGNOSIS — D631 Anemia in chronic kidney disease: Secondary | ICD-10-CM | POA: Diagnosis not present

## 2021-07-23 DIAGNOSIS — B9620 Unspecified Escherichia coli [E. coli] as the cause of diseases classified elsewhere: Secondary | ICD-10-CM | POA: Diagnosis not present

## 2021-07-25 DIAGNOSIS — N390 Urinary tract infection, site not specified: Secondary | ICD-10-CM | POA: Diagnosis not present

## 2021-07-25 DIAGNOSIS — U071 COVID-19: Secondary | ICD-10-CM | POA: Diagnosis not present

## 2021-07-25 DIAGNOSIS — B9620 Unspecified Escherichia coli [E. coli] as the cause of diseases classified elsewhere: Secondary | ICD-10-CM | POA: Diagnosis not present

## 2021-07-25 DIAGNOSIS — I129 Hypertensive chronic kidney disease with stage 1 through stage 4 chronic kidney disease, or unspecified chronic kidney disease: Secondary | ICD-10-CM | POA: Diagnosis not present

## 2021-07-25 DIAGNOSIS — A419 Sepsis, unspecified organism: Secondary | ICD-10-CM | POA: Diagnosis not present

## 2021-07-25 DIAGNOSIS — N1832 Chronic kidney disease, stage 3b: Secondary | ICD-10-CM | POA: Diagnosis not present

## 2021-07-27 DIAGNOSIS — N390 Urinary tract infection, site not specified: Secondary | ICD-10-CM | POA: Diagnosis not present

## 2021-07-27 DIAGNOSIS — B9620 Unspecified Escherichia coli [E. coli] as the cause of diseases classified elsewhere: Secondary | ICD-10-CM | POA: Diagnosis not present

## 2021-07-27 DIAGNOSIS — U071 COVID-19: Secondary | ICD-10-CM | POA: Diagnosis not present

## 2021-07-27 DIAGNOSIS — N1832 Chronic kidney disease, stage 3b: Secondary | ICD-10-CM | POA: Diagnosis not present

## 2021-07-27 DIAGNOSIS — A419 Sepsis, unspecified organism: Secondary | ICD-10-CM | POA: Diagnosis not present

## 2021-07-27 DIAGNOSIS — I129 Hypertensive chronic kidney disease with stage 1 through stage 4 chronic kidney disease, or unspecified chronic kidney disease: Secondary | ICD-10-CM | POA: Diagnosis not present

## 2021-07-31 ENCOUNTER — Telehealth: Payer: Self-pay | Admitting: *Deleted

## 2021-07-31 NOTE — Chronic Care Management (AMB) (Signed)
°  Care Management   Note  07/31/2021 Name: Wendy Krueger MRN: [id number] DOB: 04/16/1930  Wendy Krueger is a 88 y.o. year old female who is a primary care patient of Tower, Wynelle Fanny, MD and is actively engaged with the care management team. I reached out to Wilmer Floor by phone today to assist with re-scheduling a follow up visit with the RN Case Manager  Follow up plan: Unsuccessful telephone outreach attempt made. A HIPAA compliant phone message was left for the patient providing contact information and requesting a return call.   Julian Hy, [facility]  Direct Dial: [phone]

## 2021-08-01 DIAGNOSIS — I129 Hypertensive chronic kidney disease with stage 1 through stage 4 chronic kidney disease, or unspecified chronic kidney disease: Secondary | ICD-10-CM | POA: Diagnosis not present

## 2021-08-01 DIAGNOSIS — B9620 Unspecified Escherichia coli [E. coli] as the cause of diseases classified elsewhere: Secondary | ICD-10-CM | POA: Diagnosis not present

## 2021-08-01 DIAGNOSIS — N1832 Chronic kidney disease, stage 3b: Secondary | ICD-10-CM | POA: Diagnosis not present

## 2021-08-01 DIAGNOSIS — U071 COVID-19: Secondary | ICD-10-CM | POA: Diagnosis not present

## 2021-08-01 DIAGNOSIS — A419 Sepsis, unspecified organism: Secondary | ICD-10-CM | POA: Diagnosis not present

## 2021-08-01 DIAGNOSIS — N390 Urinary tract infection, site not specified: Secondary | ICD-10-CM | POA: Diagnosis not present

## 2021-08-01 NOTE — Chronic Care Management (AMB) (Signed)
°  Care Management   Note  08/01/2021 Name: TAMULA MORRICAL MRN: [id number] DOB: April 15, 1930  DREAMER CARILLO is a 88 y.o. year old female who is a primary care patient of Tower, Wynelle Fanny, MD and is actively engaged with the care management team. I reached out to Wilmer Floor by phone today to assist with re-scheduling a follow up visit with the RN Case Manager  Follow up plan: Telephone appointment with care management team member scheduled for: 09/03/2021  Julian Hy, [place], [place] Management  Direct Dial: [phone]

## 2021-08-03 DIAGNOSIS — N1832 Chronic kidney disease, stage 3b: Secondary | ICD-10-CM | POA: Diagnosis not present

## 2021-08-03 DIAGNOSIS — B9620 Unspecified Escherichia coli [E. coli] as the cause of diseases classified elsewhere: Secondary | ICD-10-CM | POA: Diagnosis not present

## 2021-08-03 DIAGNOSIS — N390 Urinary tract infection, site not specified: Secondary | ICD-10-CM | POA: Diagnosis not present

## 2021-08-03 DIAGNOSIS — U071 COVID-19: Secondary | ICD-10-CM | POA: Diagnosis not present

## 2021-08-03 DIAGNOSIS — A419 Sepsis, unspecified organism: Secondary | ICD-10-CM | POA: Diagnosis not present

## 2021-08-03 DIAGNOSIS — I129 Hypertensive chronic kidney disease with stage 1 through stage 4 chronic kidney disease, or unspecified chronic kidney disease: Secondary | ICD-10-CM | POA: Diagnosis not present

## 2021-08-07 DIAGNOSIS — I129 Hypertensive chronic kidney disease with stage 1 through stage 4 chronic kidney disease, or unspecified chronic kidney disease: Secondary | ICD-10-CM | POA: Diagnosis not present

## 2021-08-07 DIAGNOSIS — N1832 Chronic kidney disease, stage 3b: Secondary | ICD-10-CM | POA: Diagnosis not present

## 2021-08-07 DIAGNOSIS — N390 Urinary tract infection, site not specified: Secondary | ICD-10-CM | POA: Diagnosis not present

## 2021-08-07 DIAGNOSIS — U071 COVID-19: Secondary | ICD-10-CM | POA: Diagnosis not present

## 2021-08-07 DIAGNOSIS — B9620 Unspecified Escherichia coli [E. coli] as the cause of diseases classified elsewhere: Secondary | ICD-10-CM | POA: Diagnosis not present

## 2021-08-07 DIAGNOSIS — A419 Sepsis, unspecified organism: Secondary | ICD-10-CM | POA: Diagnosis not present

## 2021-08-08 DIAGNOSIS — A419 Sepsis, unspecified organism: Secondary | ICD-10-CM | POA: Diagnosis not present

## 2021-08-08 DIAGNOSIS — N1832 Chronic kidney disease, stage 3b: Secondary | ICD-10-CM | POA: Diagnosis not present

## 2021-08-08 DIAGNOSIS — U071 COVID-19: Secondary | ICD-10-CM | POA: Diagnosis not present

## 2021-08-08 DIAGNOSIS — B9620 Unspecified Escherichia coli [E. coli] as the cause of diseases classified elsewhere: Secondary | ICD-10-CM | POA: Diagnosis not present

## 2021-08-08 DIAGNOSIS — I129 Hypertensive chronic kidney disease with stage 1 through stage 4 chronic kidney disease, or unspecified chronic kidney disease: Secondary | ICD-10-CM | POA: Diagnosis not present

## 2021-08-08 DIAGNOSIS — N390 Urinary tract infection, site not specified: Secondary | ICD-10-CM | POA: Diagnosis not present

## 2021-08-10 ENCOUNTER — Telehealth: Payer: Medicare Other

## 2021-08-13 ENCOUNTER — Other Ambulatory Visit: Payer: Self-pay | Admitting: Family Medicine

## 2021-08-14 DIAGNOSIS — A419 Sepsis, unspecified organism: Secondary | ICD-10-CM | POA: Diagnosis not present

## 2021-08-14 DIAGNOSIS — N1832 Chronic kidney disease, stage 3b: Secondary | ICD-10-CM | POA: Diagnosis not present

## 2021-08-14 DIAGNOSIS — B9620 Unspecified Escherichia coli [E. coli] as the cause of diseases classified elsewhere: Secondary | ICD-10-CM | POA: Diagnosis not present

## 2021-08-14 DIAGNOSIS — U071 COVID-19: Secondary | ICD-10-CM | POA: Diagnosis not present

## 2021-08-14 DIAGNOSIS — I129 Hypertensive chronic kidney disease with stage 1 through stage 4 chronic kidney disease, or unspecified chronic kidney disease: Secondary | ICD-10-CM | POA: Diagnosis not present

## 2021-08-14 DIAGNOSIS — N390 Urinary tract infection, site not specified: Secondary | ICD-10-CM | POA: Diagnosis not present

## 2021-08-16 ENCOUNTER — Telehealth: Payer: Self-pay | Admitting: Family Medicine

## 2021-08-16 DIAGNOSIS — I129 Hypertensive chronic kidney disease with stage 1 through stage 4 chronic kidney disease, or unspecified chronic kidney disease: Secondary | ICD-10-CM | POA: Diagnosis not present

## 2021-08-16 DIAGNOSIS — U071 COVID-19: Secondary | ICD-10-CM | POA: Diagnosis not present

## 2021-08-16 DIAGNOSIS — B9620 Unspecified Escherichia coli [E. coli] as the cause of diseases classified elsewhere: Secondary | ICD-10-CM | POA: Diagnosis not present

## 2021-08-16 DIAGNOSIS — A419 Sepsis, unspecified organism: Secondary | ICD-10-CM | POA: Diagnosis not present

## 2021-08-16 DIAGNOSIS — N1832 Chronic kidney disease, stage 3b: Secondary | ICD-10-CM | POA: Diagnosis not present

## 2021-08-16 DIAGNOSIS — N390 Urinary tract infection, site not specified: Secondary | ICD-10-CM | POA: Diagnosis not present

## 2021-08-16 NOTE — Telephone Encounter (Signed)
Home Health verbal orders [place] Name:Amy Agency Name: [facility] number: [phone]  Requesting OT/PT/Skilled nursing/Social Work/Speech:  Reason:Recertify for 4 more weeks of Skilled Nursing and also a UA and CS  Frequency:  Please forward to [facility] pool or providers CMA

## 2021-08-17 NOTE — Telephone Encounter (Signed)
Please ok that verbal order  

## 2021-08-17 NOTE — Telephone Encounter (Signed)
Related the message

## 2021-08-21 ENCOUNTER — Telehealth: Payer: Self-pay | Admitting: Family Medicine

## 2021-08-21 DIAGNOSIS — N1832 Chronic kidney disease, stage 3b: Secondary | ICD-10-CM | POA: Diagnosis not present

## 2021-08-21 DIAGNOSIS — N390 Urinary tract infection, site not specified: Secondary | ICD-10-CM | POA: Diagnosis not present

## 2021-08-21 DIAGNOSIS — I129 Hypertensive chronic kidney disease with stage 1 through stage 4 chronic kidney disease, or unspecified chronic kidney disease: Secondary | ICD-10-CM | POA: Diagnosis not present

## 2021-08-21 DIAGNOSIS — U071 COVID-19: Secondary | ICD-10-CM | POA: Diagnosis not present

## 2021-08-21 DIAGNOSIS — A419 Sepsis, unspecified organism: Secondary | ICD-10-CM | POA: Diagnosis not present

## 2021-08-21 DIAGNOSIS — B9620 Unspecified Escherichia coli [E. coli] as the cause of diseases classified elsewhere: Secondary | ICD-10-CM | POA: Diagnosis not present

## 2021-08-21 NOTE — Telephone Encounter (Signed)
Please ok that verbal order  

## 2021-08-21 NOTE — Telephone Encounter (Signed)
Home Health verbal orders [place] [facility]  Callback number: [phone]  Requesting OT/PT/Skilled nursing/Social Work/Speech: pt  Reason: gait training  Frequency: 1w5  Please forward to [facility] pool or providers CMA

## 2021-08-22 ENCOUNTER — Telehealth: Payer: Self-pay | Admitting: Family Medicine

## 2021-08-22 DIAGNOSIS — F0390 Unspecified dementia without behavioral disturbance: Secondary | ICD-10-CM | POA: Diagnosis not present

## 2021-08-22 DIAGNOSIS — Z86718 Personal history of other venous thrombosis and embolism: Secondary | ICD-10-CM | POA: Diagnosis not present

## 2021-08-22 DIAGNOSIS — I720 Aneurysm of carotid artery: Secondary | ICD-10-CM | POA: Diagnosis not present

## 2021-08-22 DIAGNOSIS — M810 Age-related osteoporosis without current pathological fracture: Secondary | ICD-10-CM | POA: Diagnosis not present

## 2021-08-22 DIAGNOSIS — M069 Rheumatoid arthritis, unspecified: Secondary | ICD-10-CM | POA: Diagnosis not present

## 2021-08-22 DIAGNOSIS — F419 Anxiety disorder, unspecified: Secondary | ICD-10-CM | POA: Diagnosis not present

## 2021-08-22 DIAGNOSIS — G4700 Insomnia, unspecified: Secondary | ICD-10-CM | POA: Diagnosis not present

## 2021-08-22 DIAGNOSIS — K648 Other hemorrhoids: Secondary | ICD-10-CM | POA: Diagnosis not present

## 2021-08-22 DIAGNOSIS — N390 Urinary tract infection, site not specified: Secondary | ICD-10-CM | POA: Diagnosis not present

## 2021-08-22 DIAGNOSIS — B9620 Unspecified Escherichia coli [E. coli] as the cause of diseases classified elsewhere: Secondary | ICD-10-CM | POA: Diagnosis not present

## 2021-08-22 DIAGNOSIS — K449 Diaphragmatic hernia without obstruction or gangrene: Secondary | ICD-10-CM | POA: Diagnosis not present

## 2021-08-22 DIAGNOSIS — D509 Iron deficiency anemia, unspecified: Secondary | ICD-10-CM | POA: Diagnosis not present

## 2021-08-22 DIAGNOSIS — D631 Anemia in chronic kidney disease: Secondary | ICD-10-CM | POA: Diagnosis not present

## 2021-08-22 DIAGNOSIS — E785 Hyperlipidemia, unspecified: Secondary | ICD-10-CM | POA: Diagnosis not present

## 2021-08-22 DIAGNOSIS — I129 Hypertensive chronic kidney disease with stage 1 through stage 4 chronic kidney disease, or unspecified chronic kidney disease: Secondary | ICD-10-CM | POA: Diagnosis not present

## 2021-08-22 DIAGNOSIS — M1990 Unspecified osteoarthritis, unspecified site: Secondary | ICD-10-CM | POA: Diagnosis not present

## 2021-08-22 DIAGNOSIS — H259 Unspecified age-related cataract: Secondary | ICD-10-CM | POA: Diagnosis not present

## 2021-08-22 DIAGNOSIS — Z8616 Personal history of COVID-19: Secondary | ICD-10-CM | POA: Diagnosis not present

## 2021-08-22 DIAGNOSIS — A419 Sepsis, unspecified organism: Secondary | ICD-10-CM | POA: Diagnosis not present

## 2021-08-22 DIAGNOSIS — Z8673 Personal history of transient ischemic attack (TIA), and cerebral infarction without residual deficits: Secondary | ICD-10-CM | POA: Diagnosis not present

## 2021-08-22 DIAGNOSIS — N1832 Chronic kidney disease, stage 3b: Secondary | ICD-10-CM | POA: Diagnosis not present

## 2021-08-22 NOTE — Telephone Encounter (Signed)
VO given to [facility]

## 2021-08-22 NOTE — Telephone Encounter (Signed)
Wendy Krueger called in from [facility] and they did a UA and they got the results, it was positive for Ecoli. And she took her BP 100/60 today , they have been low for a few months when she sees her. And she wanted to know what to do due to she takes a fluid pill (hydrochlorothiazide (HYDRODIURIL) 25 MG tablet) and the family is having a hard time getting her to drink water and wanted to know if they can cut it down.  And she didn't want to get her dehydrated.

## 2021-08-22 NOTE — Telephone Encounter (Signed)
Thanks - is there a sensitivity report? What symptoms is she having? Please cut the hctz in 1/2 and report back next week- if still low we can stop it entirely   Thanks so much !

## 2021-08-23 MED ORDER — CEPHALEXIN 500 MG PO CAPS: 500.0000 mg | ORAL_CAPSULE | Freq: Two times a day (BID) | ORAL | 0 refills | Status: DC

## 2021-08-23 NOTE — Telephone Encounter (Signed)
I sent keflex to her pharmacy Please keep [place] posted  I am leaving town soon and will not be here to see the fax but let me know if I need to do anything

## 2021-08-23 NOTE — Telephone Encounter (Signed)
Called daughter and advised her of Dr. Marliss Coots comments and that Rx was sent to pharmacy and she will cut HCTZ in half and they will keep [place] posted

## 2021-08-23 NOTE — Telephone Encounter (Signed)
Spoke with Amy she will have family cut HCTZ in 1/2 and update [place] next week.  Amy said pt was even more confused and couldn't follow simple commands. She said it was uncomfortable to use the bathroom and also was Hallucinating. They did a UA and Cx and sent it to [facility]. They said they faxed [place] the report yesterday. So far it's not at the front will double check S drive later to see if they have uploaded results there. Amy will also re-fax results to [place] today.

## 2021-08-23 NOTE — Addendum Note (Signed)
Addended by: Loura Pardon A on: 08/23/2021 12:55 PM   Modules accepted: Orders

## 2021-08-27 DIAGNOSIS — N1832 Chronic kidney disease, stage 3b: Secondary | ICD-10-CM | POA: Diagnosis not present

## 2021-08-27 DIAGNOSIS — A419 Sepsis, unspecified organism: Secondary | ICD-10-CM | POA: Diagnosis not present

## 2021-08-27 DIAGNOSIS — B9620 Unspecified Escherichia coli [E. coli] as the cause of diseases classified elsewhere: Secondary | ICD-10-CM | POA: Diagnosis not present

## 2021-08-27 DIAGNOSIS — I129 Hypertensive chronic kidney disease with stage 1 through stage 4 chronic kidney disease, or unspecified chronic kidney disease: Secondary | ICD-10-CM | POA: Diagnosis not present

## 2021-08-27 DIAGNOSIS — D631 Anemia in chronic kidney disease: Secondary | ICD-10-CM | POA: Diagnosis not present

## 2021-08-27 DIAGNOSIS — N390 Urinary tract infection, site not specified: Secondary | ICD-10-CM | POA: Diagnosis not present

## 2021-08-28 ENCOUNTER — Telehealth: Payer: Self-pay | Admitting: Family Medicine

## 2021-08-28 DIAGNOSIS — D631 Anemia in chronic kidney disease: Secondary | ICD-10-CM | POA: Diagnosis not present

## 2021-08-28 DIAGNOSIS — N1832 Chronic kidney disease, stage 3b: Secondary | ICD-10-CM | POA: Diagnosis not present

## 2021-08-28 DIAGNOSIS — B9620 Unspecified Escherichia coli [E. coli] as the cause of diseases classified elsewhere: Secondary | ICD-10-CM | POA: Diagnosis not present

## 2021-08-28 DIAGNOSIS — I129 Hypertensive chronic kidney disease with stage 1 through stage 4 chronic kidney disease, or unspecified chronic kidney disease: Secondary | ICD-10-CM | POA: Diagnosis not present

## 2021-08-28 DIAGNOSIS — A419 Sepsis, unspecified organism: Secondary | ICD-10-CM | POA: Diagnosis not present

## 2021-08-28 DIAGNOSIS — N390 Urinary tract infection, site not specified: Secondary | ICD-10-CM | POA: Diagnosis not present

## 2021-08-28 NOTE — Telephone Encounter (Signed)
Hold hctz and take off list  Cut losartan in 1/2 and give 50 mg daily instead of 100 Update me in several days please   Thanks so much for the update

## 2021-08-28 NOTE — Telephone Encounter (Signed)
Wendy Krueger from [facility] stated that they cut her hctz in half starting Thursday and she is still having low blood pressure and she took a reading and it was 80/50 and HR 88 they are trying to wake her up to feed her some crackers and juice, and yesterday she didn't get up until 3pm and she didn't have any juice or food   Reading: today 117/69 (around 11) Yesterday 91/63 108/74(2/26) 91/63 (2/25)

## 2021-08-28 NOTE — Telephone Encounter (Signed)
Amy notified of Dr. Marliss Coots comments. She will d/c the HCTZ and cut losartan in 1/2 if pharmacy said pill can't be cut will call back for [place] to send in the 50 mg tab

## 2021-08-30 ENCOUNTER — Other Ambulatory Visit: Payer: Self-pay | Admitting: Family Medicine

## 2021-08-31 DIAGNOSIS — K449 Diaphragmatic hernia without obstruction or gangrene: Secondary | ICD-10-CM

## 2021-08-31 DIAGNOSIS — H259 Unspecified age-related cataract: Secondary | ICD-10-CM

## 2021-08-31 DIAGNOSIS — D631 Anemia in chronic kidney disease: Secondary | ICD-10-CM | POA: Diagnosis not present

## 2021-08-31 DIAGNOSIS — M069 Rheumatoid arthritis, unspecified: Secondary | ICD-10-CM | POA: Diagnosis not present

## 2021-08-31 DIAGNOSIS — I720 Aneurysm of carotid artery: Secondary | ICD-10-CM

## 2021-08-31 DIAGNOSIS — K648 Other hemorrhoids: Secondary | ICD-10-CM | POA: Diagnosis not present

## 2021-08-31 DIAGNOSIS — I129 Hypertensive chronic kidney disease with stage 1 through stage 4 chronic kidney disease, or unspecified chronic kidney disease: Secondary | ICD-10-CM | POA: Diagnosis not present

## 2021-08-31 DIAGNOSIS — M810 Age-related osteoporosis without current pathological fracture: Secondary | ICD-10-CM | POA: Diagnosis not present

## 2021-08-31 DIAGNOSIS — Z8616 Personal history of COVID-19: Secondary | ICD-10-CM

## 2021-08-31 DIAGNOSIS — N1832 Chronic kidney disease, stage 3b: Secondary | ICD-10-CM | POA: Diagnosis not present

## 2021-08-31 DIAGNOSIS — M1990 Unspecified osteoarthritis, unspecified site: Secondary | ICD-10-CM | POA: Diagnosis not present

## 2021-08-31 DIAGNOSIS — Z8673 Personal history of transient ischemic attack (TIA), and cerebral infarction without residual deficits: Secondary | ICD-10-CM

## 2021-08-31 DIAGNOSIS — Z86718 Personal history of other venous thrombosis and embolism: Secondary | ICD-10-CM

## 2021-08-31 DIAGNOSIS — B9620 Unspecified Escherichia coli [E. coli] as the cause of diseases classified elsewhere: Secondary | ICD-10-CM | POA: Diagnosis not present

## 2021-08-31 DIAGNOSIS — F419 Anxiety disorder, unspecified: Secondary | ICD-10-CM

## 2021-08-31 DIAGNOSIS — G4700 Insomnia, unspecified: Secondary | ICD-10-CM

## 2021-08-31 DIAGNOSIS — N390 Urinary tract infection, site not specified: Secondary | ICD-10-CM | POA: Diagnosis not present

## 2021-08-31 DIAGNOSIS — D509 Iron deficiency anemia, unspecified: Secondary | ICD-10-CM | POA: Diagnosis not present

## 2021-08-31 DIAGNOSIS — E785 Hyperlipidemia, unspecified: Secondary | ICD-10-CM | POA: Diagnosis not present

## 2021-08-31 DIAGNOSIS — F0390 Unspecified dementia without behavioral disturbance: Secondary | ICD-10-CM

## 2021-08-31 DIAGNOSIS — A419 Sepsis, unspecified organism: Secondary | ICD-10-CM | POA: Diagnosis not present

## 2021-09-03 ENCOUNTER — Ambulatory Visit (INDEPENDENT_AMBULATORY_CARE_PROVIDER_SITE_OTHER): Payer: Medicare Other

## 2021-09-03 DIAGNOSIS — Z9181 History of falling: Secondary | ICD-10-CM

## 2021-09-03 DIAGNOSIS — F03C0 Unspecified dementia, severe, without behavioral disturbance, psychotic disturbance, mood disturbance, and anxiety: Secondary | ICD-10-CM

## 2021-09-03 DIAGNOSIS — I10 Essential (primary) hypertension: Secondary | ICD-10-CM

## 2021-09-03 NOTE — Chronic Care Management (AMB) (Signed)
Chronic Care Management   CCM RN Visit Note  09/03/2021 Name: Wendy Krueger MRN: [id number] DOB: [birth year]  Subjective: Wendy Krueger is a 88 y.o. year old female who is a primary care patient of Tower, Wynelle Fanny, MD. The care management team was consulted for assistance with disease management and care coordination needs.    Engaged with patient by telephone for follow up visit in response to provider referral for case management and/or care coordination services.   Consent to Services:  The patient was given information about Chronic Care Management services, agreed to services, and gave verbal consent prior to initiation of services.  Please see initial visit note for detailed documentation.   Patient agreed to services and verbal consent obtained.   Assessment: Review of patient past medical history, allergies, medications, health status, including review of consultants reports, laboratory and other test data, was performed as part of comprehensive evaluation and provision of chronic care management services.   SDOH (Social Determinants of Health) assessments and interventions performed:    CCM Care Plan  Allergies  Allergen Reactions   Alendronate Sodium Other (See Comments)    Leg pain    Amlodipine Besylate Hives   Calcitonin ([place]) Other (See Comments)     headache/ head pressure   Plavix [Clopidogrel Bisulfate] Other (See Comments)    drowsy     Simvastatin Other (See Comments)    Leg pain     Outpatient Encounter Medications as of 09/03/2021  Medication Sig Note   acetaminophen (TYLENOL) 500 MG tablet Take 500 mg by mouth every 6 (six) hours as needed.    Ascorbic Acid (VITAMIN C) 1000 MG tablet Take 1,000 mg by mouth daily.    Chlorpheniramine Maleate (ALLERGY PO) Take 1 tablet by mouth daily.     donepezil (ARICEPT) 10 MG tablet TAKE 1 TABLET BY MOUTH AT BEDTIME    iron polysaccharides (NIFEREX) 150 MG capsule TAKE 1 CAPSULE (150 MG TOTAL) BY MOUTH EVERY  OTHER DAY.    losartan (COZAAR) 100 MG tablet TAKE 1 TABLET BY MOUTH EVERY DAY 09/03/2021: Daughter states patient is taking 1/2 tablet daily as recommend by provider.    Melatonin 10 MG TABS Take 10 mg by mouth at bedtime.    memantine (NAMENDA) 10 MG tablet TAKE 1 TABLET BY MOUTH TWICE A DAY    metoprolol tartrate (LOPRESSOR) 25 MG tablet TAKE 1 TABLET BY MOUTH TWICE A DAY    Multiple Vitamins-Minerals ([employer] SPECTRAVITE PO) Take 1 capsule by mouth daily.    polyethylene glycol (MIRALAX / GLYCOLAX) 17 g packet Take 17 g by mouth daily. (Patient taking differently: Take 17 g by mouth daily as needed.)    potassium chloride (KLOR-CON 10) 10 MEQ tablet Take 1 tablet (10 mEq total) by mouth daily.    Probiotic Product (PROBIOTIC PO) Take 1 tablet by mouth every other day.     QUEtiapine (SEROQUEL) 25 MG tablet TAKE 1/2-1 TABLET BY MOUTH AS NEEDED IN THE EVENING FOR AGITATION/HALLUCINATIONS    vitamin B-12 (CYANOCOBALAMIN) 1000 MCG tablet Take 1,000 mcg by mouth daily.    cephALEXin (KEFLEX) 500 MG capsule Take 1 capsule (500 mg total) by mouth 2 (two) times daily. (Patient not taking: Reported on 09/03/2021)    hydrochlorothiazide (HYDRODIURIL) 25 MG tablet Take 1 tablet (25 mg total) by mouth daily. (Patient not taking: Reported on 09/03/2021)    No facility-administered encounter medications on file as of 09/03/2021.    Patient Active Problem List   Diagnosis  Date Noted   SOB (shortness of breath) 06/20/2021   Acute cystitis without hematuria    COVID    Closed right hip fracture (HCC) 12/07/2019   Pedal edema 08/13/2019   Polyp of colon    Gastrointestinal hemorrhage    Chronic renal failure, stage 3b (HCC)    DVT of lower extremity, bilateral ([place]) 08/06/2019   Bleeding hemorrhoids 08/06/2019   DVT, lower extremity, distal, acute, bilateral ([place]) 08/06/2019   Advanced dementia 08/02/2019   Abnormal CT of the abdomen    External hemorrhoids    Transient alteration of awareness 07/14/2018    Chronic diarrhea 02/17/2018   History of fracture of radius 02/17/2018   Hypokalemia 02/09/2018   Hypotension 02/09/2018   History of lower GI bleeding 02/09/2018   Acute blood loss anemia 02/09/2018   History of CVA (cerebrovascular accident) 03/10/2016   Carotid aneurysm, right ([place]) 02/21/2016   Lipoma of shoulder 03/27/2015   History of closed Colles' fracture 09/06/2014   Allergic rhinitis 44/81/8563   Diastolic dysfunction 14/97/0263   Iron deficiency anemia 03/25/2014   History of GI bleed 03/21/2014   BRBPR (bright red blood per rectum) 03/19/2014   Anxiety 09/01/2013   Encounter for Medicare annual wellness exam 01/08/2013   Gallstones 11/11/2011   Arterial ischemic stroke, 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   Risk for falls 06/04/2011   Constipation 03/05/2011   Vitamin D deficiency 03/28/2009   HYPERCHOLESTEROLEMIA, PURE 03/17/2007   Essential hypertension 03/16/2007   OSTEOARTHRITIS 03/16/2007   Osteoporosis 03/16/2007    Conditions to be addressed/monitored:HTN, Dementia, and Falls  Care Plan : [facility] Plan of care  Updates made by Wendy Karvonen, RN since 09/03/2021 12:00 AM     Problem: Chronic disease managemet, education and / or care coordination needs.   Priority: High     Long-Range Goal: Development of plan of care to address chronic disease management and / or care coordination needs.   Start Date: 05/18/2021  Expected End Date: 11/28/2021  Priority: High  Note:   Current Barriers:  Knowledge Deficits related to plan of care for management of HTN and Dementia  Chronic Disease Management support and education needs related to HTN and Dementia Cognitive Deficits:  Dementia Call to daughter, Wendy Krueger. HIPAA verified by daughter for patient.   Daughter states patient is better. She reports patient was hospitalized on 06/19/21 for COVID and urinary tract infection. She reports patient developed a 2nd urinary tract infection that she has since completed antibiotics for.   Daughter states patient continues to have physical therapy and nursing follow up through home health.  She reports patient has not had recent follow up with palliative care.  Daughter states patients blood pressure medication was changed due to low blood pressures.  She reports current blood pressure readings:  141/74, 142/80, 141/88, 139/76.   Daughter denies patient having any fall since last outreach with RNCM.  RNCM Clinical Goal(s):  Patient / caregiver will verbalize understanding of plan for management of HTN and Dementia take all medications exactly as prescribed and will call provider for medication related questions attend all scheduled medical appointments:  continue to work with RN Care Manager to address care management and care coordination needs related to  HTN and Dementia through collaboration with RN Care manager, provider, and care team.   Interventions: 1:1 collaboration with primary care provider regarding development and update of comprehensive plan of care as evidenced by provider attestation and co-signature Inter-disciplinary care team collaboration (see longitudinal plan of care) Evaluation  of current treatment plan related to  self management and patient's adherence to plan as established by provider  Hypertension Interventions: Goal on track: Yes Last practice recorded BP readings:  BP Readings from Last 3 Encounters:  07/03/21 136/82  06/22/21 (!) 148/68  07/05/20 (!) 142/86  Most recent eGFR/CrCl: No results found for: EGFR  No components found for: CRCL  Evaluation of current treatment plan related to hypertension self management and patient's adherence to plan as established by provider; Reviewed medications with caregiver and discussed importance of compliance; Discussed plans with caregiver for ongoing care management follow up and provided caregiver with direct contact information for care management team; Reviewed scheduled/upcoming provider appointments    Advised caregiver to continue monitoring patients blood pressure at least 2-3 times per week and record, calling PCP for findings outside established parameters. Discussed importance of low salt/ DASH diet  Dementia / Falls interventions: Goal on track:  Yes Evaluation of current treatment plan related to HTN and Dementia,  self-management and patient's adherence to plan as established by provider. Discussed plans with caregiver for ongoing care management follow up and provided caregiver with direct contact information for care management team Evaluation of current treatment plan related to Dementia and patient's adherence to plan as established by provider; Reviewed medications with caregiver  and discussed Reviewed scheduled/upcoming provider appointments Discussed plans with caregiver for ongoing care management follow up and provided caregiver with direct contact information for care management team;  Discussed fall precautions / home safety  Assessed for falls  Patient Goals/Self-Care Activities: Caregiver will administer medications as prescribed as evidenced by self report/primary caregiver report  Patient will attend all scheduled provider appointments as evidenced by clinician review of documented attendance to scheduled appointments and patient/caregiver report Caregiver  will call pharmacy for medication refills as evidenced by patient report and review of pharmacy fill history as appropriate Caregiver  will call provider office for new concerns or questions as evidenced by review of documented incoming telephone call notes and patient report Continue to check blood pressure  2-3 x per week and record. Continue to use assistive device ( walker) for ambulation Follow fall safety precautions: remove throw rugs, cords, wear non slip shoes, use ambulating device, and keep doors that lead outside locked.          Plan:The patient has been provided with contact information for the  care management team and has been advised to call with any health related questions or concerns.  The care management team will reach out to the patient again over the next 1-2 months. Quinn Plowman RN,BSN,CCM RN Case Manager [place]  [phone]

## 2021-09-03 NOTE — Patient Instructions (Signed)
Visit Information ? ?Thank you for taking time to visit with me today. Please don't hesitate to contact me if I can be of assistance to you before our next scheduled telephone appointment. ? ?Following are the goals we discussed today:  ?Caregiver will administer medications as prescribed as evidenced by self report/primary caregiver report  ?Patient will attend all scheduled provider appointments as evidenced by clinician review of documented attendance to scheduled appointments and patient/caregiver report ?Caregiver  will call pharmacy for medication refills as evidenced by patient report and review of pharmacy fill history as appropriate ?Caregiver  will call provider office for new concerns or questions as evidenced by review of documented incoming telephone call notes and patient report ?Continue to check blood pressure  2-3 x per week and record. ?Continue to use assistive device ( walker) for ambulation ?Follow fall safety precautions: remove throw rugs, cords, wear non slip shoes, use ambulating device, and keep doors that lead outside locked.  ? ?Our next appointment is by telephone on 11/01/21 at 1:00 pm ? ?Please call the care guide team at [phone] if you need to cancel or reschedule your appointment.  ? ?If you are experiencing a Mental Health or [place] or need someone to talk to, please call the Suicide and Crisis Lifeline: 988 ?call 1-800-273-TALK (toll free, 24 hour hotline)  ? ?Patient verbalizes understanding of instructions and care plan provided today and agrees to view in [place]. Active MyChart status confirmed with patient.   ? ? ?Quinn Plowman RN,BSN,CCM ?RN Case Manager ?[place]  ?[phone] ? ?

## 2021-09-05 DIAGNOSIS — N390 Urinary tract infection, site not specified: Secondary | ICD-10-CM | POA: Diagnosis not present

## 2021-09-05 DIAGNOSIS — B9620 Unspecified Escherichia coli [E. coli] as the cause of diseases classified elsewhere: Secondary | ICD-10-CM | POA: Diagnosis not present

## 2021-09-05 DIAGNOSIS — D631 Anemia in chronic kidney disease: Secondary | ICD-10-CM | POA: Diagnosis not present

## 2021-09-05 DIAGNOSIS — I129 Hypertensive chronic kidney disease with stage 1 through stage 4 chronic kidney disease, or unspecified chronic kidney disease: Secondary | ICD-10-CM | POA: Diagnosis not present

## 2021-09-05 DIAGNOSIS — A419 Sepsis, unspecified organism: Secondary | ICD-10-CM | POA: Diagnosis not present

## 2021-09-05 DIAGNOSIS — N1832 Chronic kidney disease, stage 3b: Secondary | ICD-10-CM | POA: Diagnosis not present

## 2021-09-07 DIAGNOSIS — A419 Sepsis, unspecified organism: Secondary | ICD-10-CM | POA: Diagnosis not present

## 2021-09-07 DIAGNOSIS — N1832 Chronic kidney disease, stage 3b: Secondary | ICD-10-CM | POA: Diagnosis not present

## 2021-09-07 DIAGNOSIS — N390 Urinary tract infection, site not specified: Secondary | ICD-10-CM | POA: Diagnosis not present

## 2021-09-07 DIAGNOSIS — B9620 Unspecified Escherichia coli [E. coli] as the cause of diseases classified elsewhere: Secondary | ICD-10-CM | POA: Diagnosis not present

## 2021-09-07 DIAGNOSIS — I129 Hypertensive chronic kidney disease with stage 1 through stage 4 chronic kidney disease, or unspecified chronic kidney disease: Secondary | ICD-10-CM | POA: Diagnosis not present

## 2021-09-07 DIAGNOSIS — D631 Anemia in chronic kidney disease: Secondary | ICD-10-CM | POA: Diagnosis not present

## 2021-09-11 DIAGNOSIS — B9620 Unspecified Escherichia coli [E. coli] as the cause of diseases classified elsewhere: Secondary | ICD-10-CM | POA: Diagnosis not present

## 2021-09-11 DIAGNOSIS — I129 Hypertensive chronic kidney disease with stage 1 through stage 4 chronic kidney disease, or unspecified chronic kidney disease: Secondary | ICD-10-CM | POA: Diagnosis not present

## 2021-09-11 DIAGNOSIS — D631 Anemia in chronic kidney disease: Secondary | ICD-10-CM | POA: Diagnosis not present

## 2021-09-11 DIAGNOSIS — N1832 Chronic kidney disease, stage 3b: Secondary | ICD-10-CM | POA: Diagnosis not present

## 2021-09-11 DIAGNOSIS — A419 Sepsis, unspecified organism: Secondary | ICD-10-CM | POA: Diagnosis not present

## 2021-09-11 DIAGNOSIS — N390 Urinary tract infection, site not specified: Secondary | ICD-10-CM | POA: Diagnosis not present

## 2021-09-12 ENCOUNTER — Telehealth: Payer: Self-pay

## 2021-09-12 ENCOUNTER — Other Ambulatory Visit: Payer: Self-pay

## 2021-09-12 ENCOUNTER — Observation Stay
Admission: EM | Admit: 2021-09-12 | Discharge: 2021-09-13 | Disposition: A | Discharge status: Home Health | Payer: Medicare Other | Attending: Surgery | Admitting: Surgery

## 2021-09-12 DIAGNOSIS — K625 Hemorrhage of anus and rectum: Secondary | ICD-10-CM

## 2021-09-12 DIAGNOSIS — Z79899 Other long term (current) drug therapy: Secondary | ICD-10-CM | POA: Diagnosis not present

## 2021-09-12 DIAGNOSIS — F0390 Unspecified dementia without behavioral disturbance: Secondary | ICD-10-CM | POA: Diagnosis not present

## 2021-09-12 DIAGNOSIS — K642 Third degree hemorrhoids: Secondary | ICD-10-CM | POA: Diagnosis not present

## 2021-09-12 DIAGNOSIS — K644 Residual hemorrhoidal skin tags: Principal | ICD-10-CM | POA: Insufficient documentation

## 2021-09-12 DIAGNOSIS — D649 Anemia, unspecified: Secondary | ICD-10-CM | POA: Diagnosis present

## 2021-09-12 DIAGNOSIS — R58 Hemorrhage, not elsewhere classified: Secondary | ICD-10-CM | POA: Diagnosis not present

## 2021-09-12 DIAGNOSIS — Z20822 Contact with and (suspected) exposure to covid-19: Secondary | ICD-10-CM | POA: Diagnosis not present

## 2021-09-12 DIAGNOSIS — I10 Essential (primary) hypertension: Secondary | ICD-10-CM | POA: Insufficient documentation

## 2021-09-12 DIAGNOSIS — Z8673 Personal history of transient ischemic attack (TIA), and cerebral infarction without residual deficits: Secondary | ICD-10-CM | POA: Diagnosis not present

## 2021-09-12 LAB — COMPREHENSIVE METABOLIC PANEL
ALT: 7 U/L (ref 0–44)
AST: 19 U/L (ref 15–41)
Albumin: 3.5 g/dL (ref 3.5–5.0)
Alkaline Phosphatase: 50 U/L (ref 38–126)
Anion gap: 6 (ref 5–15)
BUN: 22 mg/dL (ref 8–23)
CO2: 26 mmol/L (ref 22–32)
Calcium: 9.4 mg/dL (ref 8.9–10.3)
Chloride: 110 mmol/L (ref 98–111)
Creatinine, Ser: 1.29 mg/dL — ABNORMAL HIGH (ref 0.44–1.00)
GFR, Estimated: 40 mL/min — ABNORMAL LOW (ref >60)
Glucose, Bld: 100 mg/dL — ABNORMAL HIGH (ref 70–99)
Potassium: 4 mmol/L (ref 3.5–5.1)
Sodium: 142 mmol/L (ref 135–145)
Total Bilirubin: 0.3 mg/dL (ref 0.3–1.2)
Total Protein: 6.5 g/dL (ref 6.5–8.1)

## 2021-09-12 LAB — CBC
HCT: 26.8 % — ABNORMAL LOW (ref 36.0–46.0)
Hemoglobin: 8 g/dL — ABNORMAL LOW (ref 12.0–15.0)
MCH: 31.4 pg (ref 26.0–34.0)
MCHC: 29.9 g/dL — ABNORMAL LOW (ref 30.0–36.0)
MCV: 105.1 fL — ABNORMAL HIGH (ref 80.0–100.0)
Platelets: 155 10*3/uL (ref 150–400)
RBC: 2.55 MIL/uL — ABNORMAL LOW (ref 3.87–5.11)
RDW: 13.2 % (ref 11.5–15.5)
WBC: 4.7 10*3/uL (ref 4.0–10.5)
nRBC: 0 % (ref 0.0–0.2)

## 2021-09-12 LAB — RESP PANEL BY RT-PCR (FLU A&B, COVID) ARPGX2
Influenza A by PCR: NEGATIVE
Influenza B by PCR: NEGATIVE
SARS Coronavirus 2 by RT PCR: NEGATIVE

## 2021-09-12 LAB — PROTIME-INR
INR: 1 (ref 0.8–1.2)
Prothrombin Time: 13.4 seconds (ref 11.4–15.2)

## 2021-09-12 MED ORDER — POLYETHYLENE GLYCOL 3350 17 G PO PACK
17.0000 g | PACK | Freq: Every day | ORAL | Status: DC | PRN
  Administered 2021-09-13: 17 g via ORAL
  Filled 2021-09-12: qty 1

## 2021-09-12 MED ORDER — MELATONIN 5 MG PO TABS
10.0000 mg | ORAL_TABLET | Freq: Every day | ORAL | Status: DC
  Administered 2021-09-12: 10 mg via ORAL
  Filled 2021-09-12: qty 2

## 2021-09-12 MED ORDER — ONDANSETRON HCL 4 MG/2ML IJ SOLN: 4.0000 mg | Freq: Four times a day (QID) | INTRAMUSCULAR | Status: DC | PRN

## 2021-09-12 MED ORDER — CHLORPHENIRAMINE MALEATE 4 MG PO TABS: 4.0000 mg | ORAL_TABLET | Freq: Four times a day (QID) | ORAL | Status: DC | PRN

## 2021-09-12 MED ORDER — POLYSACCHARIDE IRON COMPLEX 150 MG PO CAPS
150.0000 mg | ORAL_CAPSULE | ORAL | Status: DC
  Administered 2021-09-13: 150 mg via ORAL
  Filled 2021-09-12: qty 1

## 2021-09-12 MED ORDER — MEMANTINE HCL 5 MG PO TABS
10.0000 mg | ORAL_TABLET | Freq: Two times a day (BID) | ORAL | Status: DC
  Administered 2021-09-12 – 2021-09-13 (×2): 10 mg via ORAL
  Filled 2021-09-12 (×2): qty 2

## 2021-09-12 MED ORDER — TRAMADOL HCL 50 MG PO TABS: 50.0000 mg | ORAL_TABLET | Freq: Four times a day (QID) | ORAL | Status: DC | PRN

## 2021-09-12 MED ORDER — SODIUM CHLORIDE 0.9 % IV SOLN: INTRAVENOUS | Status: DC

## 2021-09-12 MED ORDER — DONEPEZIL HCL 5 MG PO TABS
10.0000 mg | ORAL_TABLET | Freq: Every day | ORAL | Status: DC
  Administered 2021-09-12: 10 mg via ORAL
  Filled 2021-09-12: qty 2

## 2021-09-12 MED ORDER — HALOPERIDOL LACTATE 5 MG/ML IJ SOLN
2.0000 mg | INTRAMUSCULAR | Status: DC
Start: 2021-09-12 — End: 2021-09-12

## 2021-09-12 MED ORDER — MORPHINE SULFATE (PF) 2 MG/ML IV SOLN: 2.0000 mg | INTRAVENOUS | Status: DC | PRN

## 2021-09-12 MED ORDER — POTASSIUM CHLORIDE CRYS ER 20 MEQ PO TBCR
10.0000 meq | EXTENDED_RELEASE_TABLET | Freq: Every day | ORAL | Status: DC
  Administered 2021-09-12 – 2021-09-13 (×2): 10 meq via ORAL
  Filled 2021-09-12 (×2): qty 1

## 2021-09-12 MED ORDER — LOSARTAN POTASSIUM 50 MG PO TABS
50.0000 mg | ORAL_TABLET | Freq: Every day | ORAL | Status: DC
  Administered 2021-09-13: 50 mg via ORAL
  Filled 2021-09-12 (×2): qty 1

## 2021-09-12 MED ORDER — HYDROCODONE-ACETAMINOPHEN 5-325 MG PO TABS: 1.0000 | ORAL_TABLET | ORAL | Status: DC | PRN

## 2021-09-12 MED ORDER — SODIUM CHLORIDE 0.9% IV SOLUTION: Freq: Once | INTRAVENOUS | Status: DC

## 2021-09-12 MED ORDER — ONDANSETRON 4 MG PO TBDP: 4.0000 mg | ORAL_TABLET | Freq: Four times a day (QID) | ORAL | Status: DC | PRN

## 2021-09-12 MED ORDER — METOPROLOL TARTRATE 25 MG PO TABS
25.0000 mg | ORAL_TABLET | Freq: Two times a day (BID) | ORAL | Status: DC
  Administered 2021-09-12 – 2021-09-13 (×2): 25 mg via ORAL
  Filled 2021-09-12 (×2): qty 1

## 2021-09-12 MED ORDER — VITAMIN B-12 1000 MCG PO TABS
1000.0000 ug | ORAL_TABLET | Freq: Every day | ORAL | Status: DC
  Administered 2021-09-12 – 2021-09-13 (×2): 1000 ug via ORAL
  Filled 2021-09-12 (×2): qty 1

## 2021-09-12 MED ORDER — QUETIAPINE FUMARATE 25 MG PO TABS
25.0000 mg | ORAL_TABLET | Freq: Every day | ORAL | Status: DC
  Administered 2021-09-12: 25 mg via ORAL
  Filled 2021-09-12: qty 1

## 2021-09-12 NOTE — ED Provider Notes (Signed)
? ?[facility] ?Provider Note ? ? ? Event Date/Time  ? First MD Initiated Contact with Patient 09/12/21 1330   ?  (approximate) ? ? ?History  ? ?Rectal Bleeding ? ? ?HPI ? ?Wendy Krueger is a 88 y.o. female   with a history of hemorrhoids and colonic polyp, dementia ? ?For about 1 week now has been noticing blood in her stool.  Possibly due to hemorrhoid.  Healthcare worker came to the home today, noticed quite a bit of large amount of red blood in her stool when recommended come to ER for visit ? ?Family including daughter at bedside reports patient has been having bloody stools with frank red blood for about 1 week now and some increased fatigue and doctor noticed that her blood pressures been running slightly low for about a week ? ?She denies any pain no abdominal pain she not been vomiting or having any fevers.  She is otherwise been acting normal ? ?  ? ? ?Physical Exam  ? ?Triage Vital Signs: ?ED Triage Vitals  ?Enc Vitals Group  ?   BP 09/12/21 1244 121/74  ?   Pulse Rate 09/12/21 1244 93  ?   Resp 09/12/21 1244 18  ?   Temp 09/12/21 1244 98 ?F (36.7 ?C)  ?   Temp src --   ?   SpO2 09/12/21 1244 100 %  ?   Weight --   ?   Height --   ?   Head Circumference --   ?   Peak Flow --   ?   Pain Score 09/12/21 1237 0  ?   Pain Loc --   ?   Pain Edu? --   ?   Excl. in [place]? --   ? ? ?Most recent vital signs: ?Vitals:  ? 09/12/21 1430 09/12/21 1500  ?BP: 124/70 (!) 132/104  ?Pulse: (!) 107   ?Resp:    ?Temp:    ?SpO2:    ? ? ? ?General: Awake, no distress.  Alert to self and to recognizing her family, but not to date ?CV:  Good peripheral perfusion.  Normal heart tones good peripheral perfusion ?Resp:  Normal effort.  Clear lung sounds bilaterally ?Abd:  No distention.  No abdominal pain to palpation in any quadrant.  No rebound or guarding.  ?Other:  Rectal exam performed, escorted by nurse.  External inspection demonstrates red blood in the patient's depends, 2 external hemorrhoids none of  which show obvious or apparent bleeding.  Obvious blood is positive  ? ? ?ED Results / Procedures / Treatments  ? ?Labs ?(all labs ordered are listed, but only abnormal results are displayed) ?Labs Reviewed  ?COMPREHENSIVE METABOLIC PANEL - Abnormal; Notable for the following components:  ?    Result Value  ? Glucose, Bld 100 (*)   ? Creatinine, Ser 1.29 (*)   ? GFR, Estimated 40 (*)   ? All other components within normal limits  ?CBC - Abnormal; Notable for the following components:  ? RBC 2.55 (*)   ? Hemoglobin 8.0 (*)   ? HCT 26.8 (*)   ? MCV 105.1 (*)   ? MCHC 29.9 (*)   ? All other components within normal limits  ?RESP PANEL BY RT-PCR (FLU A&B, COVID) [id number]  ?PROTIME-INR  ?TYPE AND SCREEN  ? ? ? ?RADIOLOGY ? ? ? ? ? ?PROCEDURES: ? ?Critical Care performed: No ? ?Procedures ? ? ?MEDICATIONS ORDERED IN ED: ?Medications - No data to display ? ? ?  IMPRESSION / MDM / ASSESSMENT AND PLAN / ED COURSE  ?I reviewed the triage vital signs and the nursing notes. ?             ?               ? ?Differential diagnosis includes, but is not limited to, GI bleeding, likely somewhat acute nature.  Symptoms started about a week ago history of hemorrhoidal bleeding.  She has no associated abdominal pain, and no infectious symptoms.  Hemoglobin has dropped to 8 from her previous baseline which is a notable drop.  She is not anticoagulated INR is normal ? ?Painless GI bleeding.  Have consulted general surgery Dr. Lysle Pearl, who is seen evaluated the patient he advises he will be admitting to his service and plans to performed a bleeding scan ? ?The patient is on the cardiac monitor to evaluate for evidence of arrhythmia and/or significant heart rate changes. ? ?Clinical Course as of 09/12/21 1626  ?Wed Sep 12, 2021  ?1418 Discussed case with Dr. Lysle Pearl of general surgery, he will, and provide consultation.  We discussed case clinical history and drop in noted hemoglobin [MQ]  ?1418 Patient including her family at the bedside  understanding agreeable with the plan for consult.  The patient herself does have dementia, family at the bedside very attentive and agreeable [MQ]  ?1418 Type and screen performed.  Hemodynamically stable at this time.  Hemoglobin has dropped from her baseline of 11, suspect this to be slightly chronic in nature given the bleeding reported over a week's time.  At this time we will withhold acute blood transfusion but if the patient had evidence of severe bleeding or decompensation would consider transfusion [MQ]  ?  ?Clinical Course User Index ?[MQ] Delman Kitten, MD  ? ? ? ?FINAL CLINICAL IMPRESSION(S) / ED DIAGNOSES  ? ?Final diagnoses:  ?Rectal bleeding  ? ? ? ?Rx / DC Orders  ? ?ED Discharge Orders   ? ? None  ? ?  ? ? ? ?Note:  This document was prepared using Dragon voice recognition software and may include unintentional dictation errors. ?  Delman Kitten, MD ?09/12/21 1626 ? ?

## 2021-09-12 NOTE — Telephone Encounter (Signed)
I spoke with Amy with [facility] and she saw pt on 09/11/21 and pt was pale with skipping heart rate going from 60 - 100. BP was 106/60 and pt had not take losartan 50 mg on 09/11/21.Amy said there was a large amt of bright red blood in commode on 09/11/21 and the blood was actually dripping. I spoke with [name] (DPR signed) [name] said she spoke with her brother who was with pt and pt was sleeping. [name] said she was packing a bag to go to her mom's. [name] verified above info and said that pt is weaker than usual and even though pt has dementia is more confused than usual. [name] is going to ck with her brother when she gets there to see if can get pt in car or will call 911 to get pt to [facility] ED. Sending note to Dr Glori Bickers and have already teams [place] CMA.also Amy with [place] [facility] [place] notified pt going to ED by v/m. ?

## 2021-09-12 NOTE — Telephone Encounter (Signed)
Aware, will watch for correspondence ?Routing back to myself to monitor chart ?

## 2021-09-12 NOTE — ED Triage Notes (Signed)
Pt comes into the ED via [facility] from home c/o rectal bleeding.  Pt does have known hemorrhoids that bleed.  Per family there has been nonstop bleeding since last Thursday.   ? ?116/80 ?90 HR ?20 RR ?98% RA ?120 CBG ? ?

## 2021-09-12 NOTE — Consult Note (Deleted)
PHARMACY -  BRIEF ANTIBIOTIC NOTE  ? ?Pharmacy has received consult(s) for Vancomycin, Cefepime from an ED provider.  The patient's profile has been reviewed for ht/wt/allergies/indication/available labs.   ? ?One time order(s) placed for Vancomycin 1.5gm IV x1, Cefepime 2gm IV x1 ? ?Further antibiotics/pharmacy consults should be ordered by admitting physician if indicated.       ?                ?Darrick Penna, PharmD, MS PGPM ?Clinical Pharmacist ?09/12/2021 ?2:51 PM ? ?

## 2021-09-12 NOTE — Telephone Encounter (Signed)
Please put her in my 12:30  for visit and labs if appropriate  ?Also triage to see if there is anything else we need to know  ?If her vitals are not good (tachycardic and pale may indicate that) I may have to send to ER ?If worse in meantime-ER  ?I know she has had this before from hemorrhoids  ? ? ? ?

## 2021-09-12 NOTE — Telephone Encounter (Signed)
Amy went by to see patient last night she was having rectal bleeding x 1 week. She states that patient was tachycardic and pale. She would like to have order for stat CBC and any labs you might need to have done because patient is hard stick. She did go to bathroom with patient and there was a large amount of blood. Family would like to make sure that she needs further treatment before they are willing to take to ED.  If she does not answer she may be in with another patient please message on voice mail if she does not answer.  ? ?Amy  ?[phone] ?

## 2021-09-12 NOTE — ED Triage Notes (Addendum)
Pt comes via [facility] with c/o rectal bleeding since last Thursday. Home Nurse reports pt might be anemic. ? ?Pt denies any pain.  ? ?Pt does have hx of dementia. ?

## 2021-09-12 NOTE — H&P (Signed)
Subjective:  ?CC: BRBPR, hemorrhoids ? ?HPI: ? Wendy Krueger is a 88 y.o. female who was consulted for evaluation of hemorrhoids. Symptoms were first noted a few years ago. Unsure how much pain she is having due to baseline dementia.  she has some rectal bleeding occurring  every  time with BM.  Bleeding is described as ranging from few drops in the toilet to turning the entire bowl red. Associated with nothing specific, exacerbated by constipation ? ?Toilet habits: ?Bouts of constipation and diarrhea secondary to laxative use ?  ?Previous colonoscopy a couple years ago noted and no villous polyp was removed completely and hemorrhoids.  Recommendation at that time was to not pursue any more colonoscopies due to her advanced age. ? ?History obtained from children at bedside secondary to patient's longstanding dementia. ? ?Past Medical History:  has a past medical history of Asymptomatic gallstones, BRBPR (bright red blood per rectum), Cataract (04/13/2018), Cholelithiasis, Clot, Colitis - presumed infectious origin, Dementia ([place]), Duodenitis, Fall, Gastritis, GI bleed, Hemorrhoid, Hiatal hernia, Hyperlipidemia, Hypertension, Osteoarthritis, Osteoporosis, Stroke ([place]), and Syncope. ? ?Past Surgical History:  has a past surgical history that includes Abdominal hysterectomy; Wrist fracture surgery (08/1999); ABI's; left foot brace; dexa (07/2003); Esophagogastroduodenoscopy (N/A, 03/21/2014); Wrist fracture surgery (Left, 02/12/2018); IR IVC FILTER PLMT / S&I /IMG GUID/MOD SED (07/06/2019); IVC FILTER INSERTION (07/2019); Flexible sigmoidoscopy (N/A, 08/08/2019); Flexible sigmoidoscopy (N/A, 08/09/2019); Colonoscopy (08/09/2019); Anterior approach hemi hip arthroplasty (Right, 12/08/2019); and Hip fracture surgery. ? ?Family History: family history includes Breast cancer in her daughter; Heart attack in her father and mother; Hypertension in her father; Throat cancer in her brother. ? ?Social History:  reports that  she has never smoked. She has never used smokeless tobacco. She reports that she does not drink alcohol and does not use drugs. ? ?Current Medications:  ?Prior to Admission medications   ?Medication Sig Start Date End Date Taking? Authorizing Provider  ?acetaminophen (TYLENOL) 500 MG tablet Take 500 mg by mouth every 6 (six) hours as needed.    [provider]  ?Ascorbic Acid (VITAMIN C) 1000 MG tablet Take 1,000 mg by mouth daily.    [provider]  ?cephALEXin (KEFLEX) 500 MG capsule Take 1 capsule (500 mg total) by mouth 2 (two) times daily. ?Patient not taking: Reported on 09/03/2021 08/23/21   Tower, Wynelle Fanny, MD  ?Chlorpheniramine Maleate (ALLERGY PO) Take 1 tablet by mouth daily.     [provider]  ?donepezil (ARICEPT) 10 MG tablet TAKE 1 TABLET BY MOUTH AT BEDTIME 08/30/21   Tower, Wynelle Fanny, MD  ?hydrochlorothiazide (HYDRODIURIL) 25 MG tablet Take 1 tablet (25 mg total) by mouth daily. ?Patient not taking: Reported on 09/03/2021 07/03/21   Tower, Wynelle Fanny, MD  ?iron polysaccharides (NIFEREX) 150 MG capsule TAKE 1 CAPSULE (150 MG TOTAL) BY MOUTH EVERY OTHER DAY. 08/15/21   Tower, Wynelle Fanny, MD  ?KLOR-CON M10 10 MEQ tablet Take 20 mEq by mouth daily. 08/13/21   [provider]  ?losartan (COZAAR) 100 MG tablet TAKE 1 TABLET BY MOUTH EVERY DAY 08/15/21   Tower, Wynelle Fanny, MD  ?Melatonin 10 MG TABS Take 10 mg by mouth at bedtime.    [provider]  ?memantine (NAMENDA) 10 MG tablet TAKE 1 TABLET BY MOUTH TWICE A DAY 09/03/20   Melvenia Beam, MD  ?metoprolol tartrate (LOPRESSOR) 25 MG tablet TAKE 1 TABLET BY MOUTH TWICE A DAY 08/15/21   Tower, Wynelle Fanny, MD  ?Multiple Vitamins-Minerals ([employer] SPECTRAVITE PO) Take 1  capsule by mouth daily.    [provider]  ?polyethylene glycol (MIRALAX / GLYCOLAX) 17 g packet Take 17 g by mouth daily. ?Patient taking differently: Take 17 g by mouth daily as needed. 08/09/19   Loletha Grayer, MD  ?potassium chloride (KLOR-CON 10) 10 MEQ tablet  Take 1 tablet (10 mEq total) by mouth daily. 07/03/21   Tower, Wynelle Fanny, MD  ?Probiotic Product (PROBIOTIC PO) Take 1 tablet by mouth every other day.     [provider]  ?QUEtiapine (SEROQUEL) 25 MG tablet TAKE 1/2-1 TABLET BY MOUTH AS NEEDED IN THE EVENING FOR AGITATION/HALLUCINATIONS 06/27/21   Tower, Wynelle Fanny, MD  ?vitamin B-12 (CYANOCOBALAMIN) 1000 MCG tablet Take 1,000 mcg by mouth daily.    [provider]  ? ? ?Allergies:  ?Allergies as of 09/12/2021 - Review Complete 09/12/2021  ?Allergen Reaction Noted  ? Alendronate sodium Other (See Comments) 03/05/2011  ? Amlodipine besylate Hives 03/16/2007  ? Calcitonin (salmon) Other (See Comments) 07/23/2010  ? Plavix [clopidogrel bisulfate] Other (See Comments) 01/28/2019  ? Simvastatin Other (See Comments) 03/05/2011  ? ? ?ROS:  ?Unable to obtain secondary to current mentation ? ? ? ?Objective:  ?  ? ?BP (!) 128/94   Pulse 74   Temp 98 ?F (36.7 ?C)   Resp 18   SpO2 100%  ? ?Constitutional :  alert, combative, and distracted  ?Lymphatics/Throat::  no asymmetry, masses, or scars  ?Respiratory:  clear to auscultation bilaterally  ?Cardiovascular:  regular rate and rhythm  ?Gastrointestinal: soft, non-tender; bowel sounds normal; no masses,  no organomegaly.   ?Musculoskeletal: Steady gait and movement  ?Skin: Cool and moist,   ?Psychiatric: Normal affect, non-agitated, not confused  ?Genital/Rectal: Chaperone present for exam.  Limited external exam and DRE noted circumferential hemorrhoids with no obvious ulceration or signs of active bleeding at time of exam.  Portion of the hemorrhoids were able to be reduced with digital exam.  ?  ?LABS:  ?N/a  ? ?RADS: ?N/a ?Assessment:  ? ?  ?Chronic bright red blood per rectum ?Anemia ?Third-degree hemorrhoids ?Plan:  ?  ?1.  Initially recommended surgery due to the chronic nature of the bleeding episodes and children witnessing pulsatile bleeding from the hemorrhoidal tissue in the perianal region in the  past. Discussed risks/benefits/alternatives to surgery.  Alternatives include the options of observation, medical management.  Benefits include symptomatic relief.  I discussed  in detail and the complications related to the operation and the anesthesia, including bleeding, infection, recurrence, remote possibility of temporary or permanent fecal incontinence, poor/delayed wound healing, chronic pain, and additional procedures to address said risks. The risks of general anesthetic, if used, includes MI, CVA, sudden death or even reaction to anesthetic medications also discussed.   ?We also discussed typical post operative recovery which includes weeks to potentially months of anal pain, drainage, occasional bleeding, and sense of fecal urgency.   ? ?Patient and family members are concerned that due to her advanced age and previous consultations with surgeon stating she is too high risk to undergo procedure.  In addition, due to her baseline dementia, postoperative care and reassurance from this painful surgery may be very difficult.  At this point, they declined surgery and wishes to pursue symptomatic management as needed with transfusions if she becomes symptomatic.  Initial presentation the emergency department was prompted by change in mental status per family report. ? ?Family members also had concerns about the possibility of other sources of GI bleeding.  I explained to  them that if we pursue any additional imaging findings it will likely need procedural intervention to address.  They both verbalized understanding but still wishes to pursue additional testing to ensure no other sources. ? ?Final decision at this point was to admit for further observation, transfuse 1 unit PRBCs, and order a bleeding scan to ensure no other sources of bleeding.  The amount of bleeding reported does sound quite excessive for hemorrhoids as the sole source of the bleeding, but the family report of eyewitness extensive bleeding  just from the hemorrhoids is of concern. ? ?labs/images/medications/previous chart entries reviewed personally and relevant changes/updates noted above. ? ?

## 2021-09-13 DIAGNOSIS — D649 Anemia, unspecified: Secondary | ICD-10-CM | POA: Diagnosis not present

## 2021-09-13 DIAGNOSIS — K642 Third degree hemorrhoids: Secondary | ICD-10-CM | POA: Diagnosis not present

## 2021-09-13 DIAGNOSIS — K644 Residual hemorrhoidal skin tags: Secondary | ICD-10-CM | POA: Diagnosis not present

## 2021-09-13 DIAGNOSIS — K625 Hemorrhage of anus and rectum: Secondary | ICD-10-CM | POA: Diagnosis not present

## 2021-09-13 LAB — PREPARE RBC (CROSSMATCH)

## 2021-09-13 LAB — HEMOGLOBIN AND HEMATOCRIT, BLOOD
HCT: 29.6 % — ABNORMAL LOW (ref 36.0–46.0)
Hemoglobin: 9.5 g/dL — ABNORMAL LOW (ref 12.0–15.0)

## 2021-09-13 MED ORDER — LOSARTAN POTASSIUM 100 MG PO TABS: 50.0000 mg | ORAL_TABLET | Freq: Every day | ORAL | 1 refills | Status: DC

## 2021-09-13 MED ORDER — POLYETHYLENE GLYCOL 3350 17 G PO PACK: 17.0000 g | PACK | Freq: Every day | ORAL | Status: AC | PRN

## 2021-09-13 MED ORDER — SODIUM CHLORIDE 0.9% IV SOLUTION: Freq: Once | INTRAVENOUS | Status: AC

## 2021-09-13 NOTE — TOC CM/SW Note (Signed)
Patient has orders to discharge home today. Chart reviewed. PCP is Loura Pardon, MD. On room air. No wounds. Patient is active with [facility] for RN and PT. They do not need new orders since she is under observation status. No further concerns. CSW signing off. ? ?[place] Scrape, [address] ?[phone] ? ?

## 2021-09-13 NOTE — Progress Notes (Signed)
PATIENT DISCHARGE NOTE ? ?Patient discharged home via private vehicle with daughter and family. IV removed from RAC. Patient discharge instructions reviewed with family with understanding. ?

## 2021-09-13 NOTE — Progress Notes (Signed)
Wendy Krueger confirms no blood order is in the system. This nurse and charge nurse Frances Furbish, RN both confirm that the order is in properly.  ?

## 2021-09-13 NOTE — Progress Notes (Signed)
Patient has an order to transfuse 1 unit of RBCs. Upon set up, the patient removed her IV due to increased confusion. Patient is not easily redirected. Family assist with redirection and is still unsuccessful. Patient is given her routine scheduled medications to assist with calming her down. IV access is reestablished and wrapped in kerlex dressing. Mittens are placed on patient, however family states she will remove them. As mittens are being placed, patient becomes agitated. Mittens are removed. Family remains at bedside to assist with re-orientation and ensuring safety.Patient's blood has been returned to the blood bank at this time.  ?

## 2021-09-13 NOTE — Discharge Summary (Signed)
Physician Discharge Summary  ?Patient ID: ?Wendy Krueger ?MRN: [id number] ?DOB/AGE: May 11, 1930 88 y.o. ? ?Admit date: 09/12/2021 ?Discharge date: 09/13/21 ? ?Admission Diagnoses: anemia, hemorrhoids ? ?Discharge Diagnoses:  ?Same as above ? ?Discharged Condition: good ? ?Hospital Course: admitted for above. Underwent transfusion due to symptoms on initial presentation.  Discussed surgery with family but deemed too high risk, so observation. Bleeding scan discussed to ensure no other cause of bleeding but pt unable to cooperate due to dementia.  No further signs of bleeding and hgb recovered as expected.  After extensive discussion with family, they have decided any procedures and further workup for her BRBPR will not be worth the risk as well as post procedural care needed.  They requested to have hgb routinely monitored and transfuse prn, ideally on an outpt setting.  They plan on discussing with PCP to arrange this.  They can reconsider hemorrhoidectomy if symptoms worsen or they change their minds. ? ?Consults: None ? ?Discharge Exam: ?Blood pressure 136/72, pulse 66, temperature 99.3 ?F (37.4 ?C), temperature source Oral, resp. rate 20, SpO2 100 %. ?General appearance: alert and no distress ? ?Disposition:  ?Discharge disposition: 01-Home or Self Care ? ? ? ? ? ? ?Discharge Instructions   ? ? Discharge patient   Complete by: As directed ?  ? Discharge disposition: 01-Home or Self Care  ? Discharge patient date: 09/13/2021  ? ?  ? ?Allergies as of 09/13/2021   ? ?   Reactions  ? Alendronate Sodium Other (See Comments)  ? Leg pain   ? Amlodipine Besylate Hives  ? Calcitonin (salmon) Other (See Comments)  ?  headache/ head pressure  ? Plavix [clopidogrel Bisulfate] Other (See Comments)  ? drowsy ?   ? Simvastatin Other (See Comments)  ? Leg pain   ? ?  ? ?  ?Medication List  ?  ? ?STOP taking these medications   ? ?cephALEXin 500 MG capsule ?Commonly known as: KEFLEX ?  ?hydrochlorothiazide 25 MG tablet ?Commonly  known as: HYDRODIURIL ?  ?potassium chloride 10 MEQ tablet ?Commonly known as: Klor-Con 10 ?  ? ?  ? ?TAKE these medications   ? ?acetaminophen 500 MG tablet ?Commonly known as: TYLENOL ?Take 500 mg by mouth every 6 (six) hours as needed. ?  ?ALLERGY PO ?Take 1 tablet by mouth daily. ?  ?[facility]S SPECTRAVITE PO ?Take 1 capsule by mouth daily. ?  ?donepezil 10 MG tablet ?Commonly known as: ARICEPT ?TAKE 1 TABLET BY MOUTH AT BEDTIME ?  ?iron polysaccharides 150 MG capsule ?Commonly known as: NIFEREX ?TAKE 1 CAPSULE (150 MG TOTAL) BY MOUTH EVERY OTHER DAY. ?  ?Klor-Con M10 10 MEQ tablet ?Generic drug: potassium chloride ?Take 20 mEq by mouth daily. ?  ?losartan 100 MG tablet ?Commonly known as: COZAAR ?Take 0.5 tablets (50 mg total) by mouth daily. ?What changed: how much to take ?  ?Melatonin 10 MG Tabs ?Take 10 mg by mouth at bedtime. ?  ?memantine 10 MG tablet ?Commonly known as: NAMENDA ?TAKE 1 TABLET BY MOUTH TWICE A DAY ?  ?metoprolol tartrate 25 MG tablet ?Commonly known as: LOPRESSOR ?TAKE 1 TABLET BY MOUTH TWICE A DAY ?  ?polyethylene glycol 17 g packet ?Commonly known as: MIRALAX / GLYCOLAX ?Take 17 g by mouth daily as needed. ?  ?PROBIOTIC PO ?Take 1 tablet by mouth every other day. ?  ?QUEtiapine 25 MG tablet ?Commonly known as: SEROQUEL ?TAKE 1/2-1 TABLET BY MOUTH AS NEEDED IN THE EVENING FOR AGITATION/HALLUCINATIONS ?  ?vitamin B-12  1000 MCG tablet ?Commonly known as: CYANOCOBALAMIN ?Take 1,000 mcg by mouth daily. ?  ?vitamin C 1000 MG tablet ?Take 1,000 mg by mouth daily. ?  ? ?  ? ? Follow-up Information   ? ? Tower, Wynelle Fanny, MD Follow up.   ?Specialties: Family Medicine, Radiology ?Why: to discuss blood transfusion plan and monitoring of anemia ?Contact information: ?[address] ?[place] [address] ?[phone] ? ? ?  ?  ? ? Kayliee Atienza, DO Follow up.   ?Specialty: Surgery ?Why: As needed ?Contact information: ?[address] ?[place] [address] ?[phone] ? ? ?  ?  ? ? Health,  [facility] Follow up.   ?Specialty: Home Health Services ?Why: They will resume home health services at discharge. ? ?  ?  ? ?  ?  ? ?  ? ? ? ?Total time spent arranging discharge was >76mn. ?Signed: ?IBenjamine Sprague?09/13/2021, 7:05 PM ? ?

## 2021-09-13 NOTE — Progress Notes (Addendum)
This nurse called the blood bank to check the status of the order. Lab technician reports no new orders. Patient has an order placed in by Dr. Benjamine Sprague on March 16, 20023 at 0125 to transfuse 1 unit of blood. Lab tech confirms twice that no orders are in the system. Charge nurse Elisha Headland, RN is notified of the possible software communication breech. This nurse will continue to check on blood bank status to get transfusion started.   ?

## 2021-09-13 NOTE — Progress Notes (Signed)
Patient is now in bed with her eyes closed, equal rise and fall of the chest is noted.No obvious signs of distress is noted. Family remains at bedside and informs this nurse that she has been asleep for approximately 1 hour. Provider is notified to re enter blood order as the patient is less likely to pull at lines.  ?

## 2021-09-13 NOTE — Progress Notes (Signed)
BLOOD ADMIN COMPLETED! ? ?1 Unit infused, end time of 0930. ?

## 2021-09-18 ENCOUNTER — Ambulatory Visit (INDEPENDENT_AMBULATORY_CARE_PROVIDER_SITE_OTHER): Payer: Medicare Other | Admitting: Family Medicine

## 2021-09-18 ENCOUNTER — Encounter: Payer: Self-pay | Admitting: Family Medicine

## 2021-09-18 ENCOUNTER — Telehealth: Payer: Self-pay | Admitting: Family Medicine

## 2021-09-18 ENCOUNTER — Ambulatory Visit: Payer: Medicare Other | Admitting: Family Medicine

## 2021-09-18 ENCOUNTER — Other Ambulatory Visit: Payer: Self-pay

## 2021-09-18 VITALS — BP 130/78 | HR 78 | Ht 60.0 in | Wt 160.4 lb

## 2021-09-18 DIAGNOSIS — Z515 Encounter for palliative care: Secondary | ICD-10-CM | POA: Diagnosis not present

## 2021-09-18 DIAGNOSIS — F03C0 Unspecified dementia, severe, without behavioral disturbance, psychotic disturbance, mood disturbance, and anxiety: Secondary | ICD-10-CM

## 2021-09-18 DIAGNOSIS — D500 Iron deficiency anemia secondary to blood loss (chronic): Secondary | ICD-10-CM

## 2021-09-18 DIAGNOSIS — K649 Unspecified hemorrhoids: Secondary | ICD-10-CM

## 2021-09-18 DIAGNOSIS — D62 Acute posthemorrhagic anemia: Secondary | ICD-10-CM

## 2021-09-18 DIAGNOSIS — I10 Essential (primary) hypertension: Secondary | ICD-10-CM

## 2021-09-18 MED ORDER — HYDROCORTISONE ACETATE 25 MG RE SUPP: 25.0000 mg | Freq: Two times a day (BID) | RECTAL | 5 refills | Status: AC

## 2021-09-18 NOTE — Assessment & Plan Note (Addendum)
bp in fair control at this time  ?BP Readings from Last 1 Encounters:  ?09/18/21 130/78  ? ?No changes needed ?Most recent labs reviewed  ?Disc lifstyle change with low sodium diet and exercise  ?Continues losartan 50 mg daily  ?Metoprolol 25 mg twice daily ?Family will continue to watch for hypotension and hold accordingly ?

## 2021-09-18 NOTE — Assessment & Plan Note (Addendum)
Hemoglobin recently dropped to 8 with brisk hemorrhoidal bleeding and was hospitalized ?At discharge hemoglobin was up to 9.5 ?Patient is able to tolerate iron approximately twice a week ?Labs ordered today with clinical improvement ?Planning to check her CBC on a schedule and transfuse as needed at the cancer center if possible ?Family will check to see if the home nurse would be able to draw this at home ?

## 2021-09-18 NOTE — Assessment & Plan Note (Signed)
From hemorrhoidal bleeding ?

## 2021-09-18 NOTE — Telephone Encounter (Signed)
Home Health verbal orders ?Caller Name: Manus Gunning ?Agency Name: [facility] ? ?Callback number: [phone] ? ?Requesting OT/PT/Skilled nursing/Social Work/Speech: ? ?Reason:Verbal Orders for [place] ? ?Frequency: ? ?Please forward to [facility] pool or providers CMA  ?

## 2021-09-18 NOTE — Telephone Encounter (Signed)
Called and spoke [facility]  Ebony Hail gave verbal okay for [facility] .  [facility] [place]. [phone] ?

## 2021-09-18 NOTE — Telephone Encounter (Signed)
Please ok those verbal orders  

## 2021-09-18 NOTE — Progress Notes (Signed)
? ?Subjective:  ? ? Patient ID: Wendy Krueger, female    DOB: 07-29-1929, 87 y.o.   MRN: [id number] ? ?This visit occurred during the SARS-CoV-2 public health emergency.  Safety protocols were in place, including screening questions prior to the visit, additional usage of staff PPE, and extensive cleaning of exam room while observing appropriate contact time as indicated for disinfecting solutions.  ? ?HPI ?Pt presents for hospital follow up ? ?Wt Readings from Last 3 Encounters:  ?09/18/21 160 lb 6.4 oz (72.8 kg)  ?07/03/21 161 lb 4 oz (73.1 kg)  ?06/19/21 149 lb 14.6 oz (68 kg)  ? ?31.33 kg/m? ? ? ?Hosp for severe hemorrhoidal bleeding (acute on chronic) with anemia from 3/15 to 09/13/21  ? ?Hosp course: ? ?Hospital Course: admitted for above. Underwent transfusion due to symptoms on initial presentation.  Discussed surgery with family but deemed too high risk, so observation. Bleeding scan discussed to ensure no other cause of bleeding but pt unable to cooperate due to dementia.  No further signs of bleeding and hgb recovered as expected.  After extensive discussion with family, they have decided any procedures and further workup for her BRBPR will not be worth the risk as well as post procedural care needed.  They requested to have hgb routinely monitored and transfuse prn, ideally on an outpt setting.  They plan on discussing with PCP to arrange this.  They can reconsider hemorrhoidectomy if symptoms worsen or they change their minds. ? ?Would like to start regular routine  ? ?Miralax keeps stools soft  ? ?Lab Results  ?Component Value Date  ? CREATININE 1.29 (H) 09/12/2021  ? BUN 22 09/12/2021  ? NA 142 09/12/2021  ? K 4.0 09/12/2021  ? CL 110 09/12/2021  ? CO2 26 09/12/2021  ? ?GFR of 40 ? ?Fluid intake  ? ?Hb dropped to 8 on 09/12/21 ?Lab Results  ?Component Value Date  ? WBC 4.7 09/12/2021  ? HGB 9.5 (L) 09/13/2021  ? HCT 29.6 (L) 09/13/2021  ? MCV 105.1 (H) 09/12/2021  ? PLT 155 09/12/2021  ? ?HTN ?bp is  stable today  ?No cp or palpitations or headaches or edema  ?No side effects to medicines  ?BP Readings from Last 3 Encounters:  ?09/18/21 130/78  ?09/13/21 136/72  ?07/03/21 136/82  ?   ?Home care comes in and this is helpful  (Amy) ?Some bp have been low  ? ?Lab Results  ?Component Value Date  ? CREATININE 1.29 (H) 09/12/2021  ? BUN 22 09/12/2021  ? NA 142 09/12/2021  ? K 4.0 09/12/2021  ? CL 110 09/12/2021  ? CO2 26 09/12/2021  ? ? ?Interested in changing palliative care to different agency  ?[place]- waiting for them to contact us  ? ?Dementia - does get agitated  ?Yells that she wants to go home  ? ? ?Supplements : ?Mvi  ?Vitamin C  ?Vit B12 ?Probiotic  ? ?Iron at least twice weekly ? ?Eating fruit  ? ? ?Lab Results  ?Component Value Date  ? MGQQPYPP50 3,102 (H) 08/08/2019  ? ?Patient Active Problem List  ? Diagnosis Date Noted  ? SOB (shortness of breath) 06/20/2021  ? Closed right hip fracture ([place]) 12/07/2019  ? Pedal edema 08/13/2019  ? Polyp of colon   ? Chronic renal failure, stage 3b ([place])   ? Bleeding hemorrhoids 08/06/2019  ? History of DVT (deep vein thrombosis) 08/06/2019  ? Advanced dementia 08/02/2019  ? Abnormal CT of the abdomen   ?  External hemorrhoids   ? Transient alteration of awareness 07/14/2018  ? History of fracture of radius 02/17/2018  ? Hypokalemia 02/09/2018  ? History of lower GI bleeding 02/09/2018  ? Acute blood loss anemia 02/09/2018  ? History of CVA (cerebrovascular accident) 03/10/2016  ? Carotid aneurysm, right ([place]) 02/21/2016  ? Lipoma of shoulder 03/27/2015  ? History of closed Colles' fracture 09/06/2014  ? Allergic rhinitis 06/20/2014  ? Diastolic dysfunction 43/32/9518  ? Iron deficiency anemia 03/25/2014  ? History of GI bleed 03/21/2014  ? Encounter for Medicare annual wellness exam 01/08/2013  ? Gallstones 11/11/2011  ? Arterial ischemic stroke, 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  ? Risk for falls 06/04/2011  ? Constipation 03/05/2011  ? Vitamin D deficiency 03/28/2009  ?  HYPERCHOLESTEROLEMIA, PURE 03/17/2007  ? Essential hypertension 03/16/2007  ? OSTEOARTHRITIS 03/16/2007  ? Osteoporosis 03/16/2007  ? ?Past Medical History:  ?Diagnosis Date  ? Asymptomatic gallstones   ? BRBPR (bright red blood per rectum)   ? Cataract 04/13/2018  ? bilateral eyes  ? Cholelithiasis   ? Clot   ? RLE blood clots; s/p IVC filter  ? Colitis - presumed infectious origin   ? one ER visit   ? Dementia ([place])   ? Duodenitis   ? Fall   ? Gastritis   ? GI bleed   ? Hemorrhoid   ? bleeding  ? Hiatal hernia   ? Hyperlipidemia   ? Hypertension   ? Osteoarthritis   ? Osteoporosis   ? Stroke [facility])   ? Syncope   ? ?Past Surgical History:  ?Procedure Laterality Date  ? ABDOMINAL HYSTERECTOMY    ? BSO- fibroids  ? ABI's    ? normal  ? ANTERIOR APPROACH HEMI HIP ARTHROPLASTY Right 12/08/2019  ? Procedure: ANTERIOR APPROACH HEMI HIP ARTHROPLASTY;  Surgeon: Rod Can, MD;  Location: [facility];  Service: Orthopedics;  Laterality: Right;  ? COLONOSCOPY  08/09/2019  ? Procedure: COLONOSCOPY;  Surgeon: Lucilla Lame, MD;  Location: [facility] ENDOSCOPY;  Service: Endoscopy;;  ? dexa  07/2003  ? osteoporosis  ? ESOPHAGOGASTRODUODENOSCOPY N/A 03/21/2014  ? Procedure: ESOPHAGOGASTRODUODENOSCOPY (EGD);  Surgeon: Milus Banister, MD;  Location: [facility];  Service: Endoscopy;  Laterality: N/A;  ? FLEXIBLE SIGMOIDOSCOPY N/A 08/08/2019  ? Procedure: FLEXIBLE SIGMOIDOSCOPY;  Surgeon: Jonathon Bellows, MD;  Location: [facility] ENDOSCOPY;  Service: Gastroenterology;  Laterality: N/A;  ? FLEXIBLE SIGMOIDOSCOPY N/A 08/09/2019  ? Procedure: FLEXIBLE SIGMOIDOSCOPY;  Surgeon: Lucilla Lame, MD;  Location: [facility] ENDOSCOPY;  Service: Endoscopy;  Laterality: N/A;  ? HIP FRACTURE SURGERY    ? IR IVC FILTER PLMT / S&I /IMG GUID/MOD SED  07/06/2019  ? IVC FILTER INSERTION  07/2019  ? left foot brace    ? WRIST FRACTURE SURGERY  08/1999  ? R arm  ? WRIST FRACTURE SURGERY Left 02/12/2018  ? ?Social History  ? ?Tobacco Use  ? Smoking status: Never  ? Smokeless  tobacco: Never  ?Vaping Use  ? Vaping Use: Never used  ?Substance Use Topics  ? Alcohol use: No  ?  Alcohol/week: 0.0 standard drinks  ? Drug use: No  ? ?Family History  ?Problem Relation Age of Onset  ? Heart attack Father   ? Hypertension Father   ? Heart attack Mother   ? Throat cancer Brother   ? Breast cancer Daughter   ? ?Allergies  ?Allergen Reactions  ? Alendronate Sodium Other (See Comments)  ?  Leg pain   ? Amlodipine Besylate Hives  ? Calcitonin ([place]) Other (See Comments)  ?  headache/ head pressure  ? Plavix [Clopidogrel Bisulfate] Other (See Comments)  ?  drowsy ?   ? Simvastatin Other (See Comments)  ?  Leg pain   ? ?Current Outpatient Medications on File Prior to Visit  ?Medication Sig Dispense Refill  ? acetaminophen (TYLENOL) 500 MG tablet Take 500 mg by mouth every 6 (six) hours as needed.    ? Chlorpheniramine Maleate (ALLERGY PO) Take 1 tablet by mouth daily.     ? donepezil (ARICEPT) 10 MG tablet TAKE 1 TABLET BY MOUTH AT BEDTIME 90 tablet 1  ? iron polysaccharides (NIFEREX) 150 MG capsule TAKE 1 CAPSULE (150 MG TOTAL) BY MOUTH EVERY OTHER DAY. 45 capsule 1  ? KLOR-CON M10 10 MEQ tablet Take 20 mEq by mouth daily.    ? losartan (COZAAR) 100 MG tablet Take 0.5 tablets (50 mg total) by mouth daily. 90 tablet 1  ? Melatonin 10 MG TABS Take 10 mg by mouth at bedtime.    ? memantine (NAMENDA) 10 MG tablet TAKE 1 TABLET BY MOUTH TWICE A DAY 180 tablet 4  ? metoprolol tartrate (LOPRESSOR) 25 MG tablet TAKE 1 TABLET BY MOUTH TWICE A DAY 180 tablet 1  ? Multiple Vitamins-Minerals ([employer] SPECTRAVITE PO) Take 1 capsule by mouth daily.    ? polyethylene glycol (MIRALAX / GLYCOLAX) 17 g packet Take 17 g by mouth daily as needed.    ? Probiotic Product (PROBIOTIC PO) Take 1 tablet by mouth every other day.     ? QUEtiapine (SEROQUEL) 25 MG tablet TAKE 1/2-1 TABLET BY MOUTH AS NEEDED IN THE EVENING FOR AGITATION/HALLUCINATIONS 90 tablet 3  ? ?No current facility-administered medications on file prior to  visit.  ?  ? ? ?Review of Systems  ?Constitutional:  Negative for activity change, appetite change, fatigue, fever and unexpected weight change.  ?HENT:  Negative for congestion, ear pain, rhinorrhea, sinus pressu

## 2021-09-18 NOTE — Assessment & Plan Note (Signed)
Family may be interested in change to a different agency ([place]) ?Told we should expect a call ?

## 2021-09-18 NOTE — Assessment & Plan Note (Signed)
Patient continues regular follow-up with neurology for this ?Takes both Aricept and Namenda ?Also Seroquel as needed for agitation ?

## 2021-09-18 NOTE — Assessment & Plan Note (Signed)
Episodic and severe with resulting anemia ?Recently hospitalized and required transfusion ?Reviewed hospital records, lab results and studies in detail  ?Unfortunately not a surgical candidate ?Clinically improved  ?Continues miralax to prevent straining and anusol hc suppository as needed  ?Family will watch this closely ?We will most likely need to watch her blood count and admit her for transfusions intermittently if this continues to occur ?

## 2021-09-18 NOTE — Patient Instructions (Addendum)
Check with Amy to see if I can order them to do blood draws at home  ? ?If blood pressure goes below 100/ 60s= hold her losartan and let us know  ? ? ?I will wait to see if the new palliative care company contacts up  ? ?Continue the multi vitamin daily  ?Stop the vitamin C ?Stop vitamin B12  ? ?Goal is vitamin D 1000-2000 iu daily (this may be in the multi vitamin but if not, get it over the counter ? ?Keep using miralax on a regular schedule to keep stools soft ?Continue yogurt  ? ?Labs today  ?Then we will make a plan for anemia management  ? ? ?

## 2021-09-19 DIAGNOSIS — A419 Sepsis, unspecified organism: Secondary | ICD-10-CM | POA: Diagnosis not present

## 2021-09-19 DIAGNOSIS — I129 Hypertensive chronic kidney disease with stage 1 through stage 4 chronic kidney disease, or unspecified chronic kidney disease: Secondary | ICD-10-CM | POA: Diagnosis not present

## 2021-09-19 DIAGNOSIS — N1832 Chronic kidney disease, stage 3b: Secondary | ICD-10-CM | POA: Diagnosis not present

## 2021-09-19 DIAGNOSIS — N390 Urinary tract infection, site not specified: Secondary | ICD-10-CM | POA: Diagnosis not present

## 2021-09-19 DIAGNOSIS — B9620 Unspecified Escherichia coli [E. coli] as the cause of diseases classified elsewhere: Secondary | ICD-10-CM | POA: Diagnosis not present

## 2021-09-19 DIAGNOSIS — D631 Anemia in chronic kidney disease: Secondary | ICD-10-CM | POA: Diagnosis not present

## 2021-09-19 LAB — CBC WITH DIFFERENTIAL/PLATELET
Basophils Absolute: 0.1 10*3/uL (ref 0.0–0.1)
Basophils Relative: 1.5 % (ref 0.0–3.0)
Eosinophils Absolute: 0.1 10*3/uL (ref 0.0–0.7)
Eosinophils Relative: 3.1 % (ref 0.0–5.0)
HCT: 33.2 % — ABNORMAL LOW (ref 36.0–46.0)
Hemoglobin: 11 g/dL — ABNORMAL LOW (ref 12.0–15.0)
Lymphocytes Relative: 25.1 % (ref 12.0–46.0)
Lymphs Abs: 1 10*3/uL (ref 0.7–4.0)
MCHC: 33.1 g/dL (ref 30.0–36.0)
MCV: 97.7 fl (ref 78.0–100.0)
Monocytes Absolute: 0.4 10*3/uL (ref 0.1–1.0)
Monocytes Relative: 11.3 % (ref 3.0–12.0)
Neutro Abs: 2.4 10*3/uL (ref 1.4–7.7)
Neutrophils Relative %: 59 % (ref 43.0–77.0)
Platelets: 197 10*3/uL (ref 150.0–400.0)
RBC: 3.4 Mil/uL — ABNORMAL LOW (ref 3.87–5.11)
RDW: 13.3 % (ref 11.5–15.5)
WBC: 4 10*3/uL (ref 4.0–10.5)

## 2021-09-19 LAB — BASIC METABOLIC PANEL
BUN: 16 mg/dL (ref 6–23)
CO2: 28 mEq/L (ref 19–32)
Calcium: 9.5 mg/dL (ref 8.4–10.5)
Chloride: 107 mEq/L (ref 96–112)
Creatinine, Ser: 1.23 mg/dL — ABNORMAL HIGH (ref 0.40–1.20)
GFR: 38.85 mL/min — ABNORMAL LOW (ref >60.00)
Glucose, Bld: 80 mg/dL (ref 70–99)
Potassium: 4.1 mEq/L (ref 3.5–5.1)
Sodium: 142 mEq/L (ref 135–145)

## 2021-09-19 LAB — FERRITIN: Ferritin: 14.8 ng/mL (ref 10.0–291.0)

## 2021-09-19 LAB — IRON: Iron: 252 ug/dL — ABNORMAL HIGH (ref 42–145)

## 2021-09-20 ENCOUNTER — Encounter: Payer: Self-pay | Admitting: *Deleted

## 2021-09-20 LAB — BPAM RBC
Blood Product Expiration Date: 202304112359
Blood Product Expiration Date: 202304192359
ISSUE DATE / TIME: 202303152042
ISSUE DATE / TIME: 202303160554
Unit Type and Rh: 5100
Unit Type and Rh: 9500

## 2021-09-20 LAB — TYPE AND SCREEN
ABO/RH(D): O POS
Antibody Screen: NEGATIVE
Unit division: 0
Unit division: 0

## 2021-09-21 DIAGNOSIS — E785 Hyperlipidemia, unspecified: Secondary | ICD-10-CM | POA: Diagnosis not present

## 2021-09-21 DIAGNOSIS — K648 Other hemorrhoids: Secondary | ICD-10-CM | POA: Diagnosis not present

## 2021-09-21 DIAGNOSIS — M1990 Unspecified osteoarthritis, unspecified site: Secondary | ICD-10-CM | POA: Diagnosis not present

## 2021-09-21 DIAGNOSIS — M069 Rheumatoid arthritis, unspecified: Secondary | ICD-10-CM | POA: Diagnosis not present

## 2021-09-21 DIAGNOSIS — N390 Urinary tract infection, site not specified: Secondary | ICD-10-CM | POA: Diagnosis not present

## 2021-09-21 DIAGNOSIS — Z8673 Personal history of transient ischemic attack (TIA), and cerebral infarction without residual deficits: Secondary | ICD-10-CM | POA: Diagnosis not present

## 2021-09-21 DIAGNOSIS — D631 Anemia in chronic kidney disease: Secondary | ICD-10-CM | POA: Diagnosis not present

## 2021-09-21 DIAGNOSIS — I129 Hypertensive chronic kidney disease with stage 1 through stage 4 chronic kidney disease, or unspecified chronic kidney disease: Secondary | ICD-10-CM | POA: Diagnosis not present

## 2021-09-21 DIAGNOSIS — H259 Unspecified age-related cataract: Secondary | ICD-10-CM | POA: Diagnosis not present

## 2021-09-21 DIAGNOSIS — F0390 Unspecified dementia without behavioral disturbance: Secondary | ICD-10-CM | POA: Diagnosis not present

## 2021-09-21 DIAGNOSIS — K449 Diaphragmatic hernia without obstruction or gangrene: Secondary | ICD-10-CM | POA: Diagnosis not present

## 2021-09-21 DIAGNOSIS — F419 Anxiety disorder, unspecified: Secondary | ICD-10-CM | POA: Diagnosis not present

## 2021-09-21 DIAGNOSIS — N1832 Chronic kidney disease, stage 3b: Secondary | ICD-10-CM | POA: Diagnosis not present

## 2021-09-21 DIAGNOSIS — I720 Aneurysm of carotid artery: Secondary | ICD-10-CM | POA: Diagnosis not present

## 2021-09-21 DIAGNOSIS — B9620 Unspecified Escherichia coli [E. coli] as the cause of diseases classified elsewhere: Secondary | ICD-10-CM | POA: Diagnosis not present

## 2021-09-21 DIAGNOSIS — A419 Sepsis, unspecified organism: Secondary | ICD-10-CM | POA: Diagnosis not present

## 2021-09-21 DIAGNOSIS — M810 Age-related osteoporosis without current pathological fracture: Secondary | ICD-10-CM | POA: Diagnosis not present

## 2021-09-21 DIAGNOSIS — Z8616 Personal history of COVID-19: Secondary | ICD-10-CM | POA: Diagnosis not present

## 2021-09-21 DIAGNOSIS — Z86718 Personal history of other venous thrombosis and embolism: Secondary | ICD-10-CM | POA: Diagnosis not present

## 2021-09-21 DIAGNOSIS — G4700 Insomnia, unspecified: Secondary | ICD-10-CM | POA: Diagnosis not present

## 2021-09-21 DIAGNOSIS — D509 Iron deficiency anemia, unspecified: Secondary | ICD-10-CM | POA: Diagnosis not present

## 2021-09-27 ENCOUNTER — Telehealth: Payer: Self-pay | Admitting: Family Medicine

## 2021-09-27 DIAGNOSIS — D631 Anemia in chronic kidney disease: Secondary | ICD-10-CM | POA: Diagnosis not present

## 2021-09-27 DIAGNOSIS — N1832 Chronic kidney disease, stage 3b: Secondary | ICD-10-CM | POA: Diagnosis not present

## 2021-09-27 DIAGNOSIS — I129 Hypertensive chronic kidney disease with stage 1 through stage 4 chronic kidney disease, or unspecified chronic kidney disease: Secondary | ICD-10-CM | POA: Diagnosis not present

## 2021-09-27 DIAGNOSIS — A419 Sepsis, unspecified organism: Secondary | ICD-10-CM | POA: Diagnosis not present

## 2021-09-27 DIAGNOSIS — N390 Urinary tract infection, site not specified: Secondary | ICD-10-CM | POA: Diagnosis not present

## 2021-09-27 DIAGNOSIS — B9620 Unspecified Escherichia coli [E. coli] as the cause of diseases classified elsewhere: Secondary | ICD-10-CM | POA: Diagnosis not present

## 2021-09-27 NOTE — Telephone Encounter (Signed)
If she is clinically stable, early to mid next week please for cbc and iron ?

## 2021-09-27 NOTE — Telephone Encounter (Signed)
Amy with [facility] called  asking Dr tower when do she wants her to do pt lab work. Please advise. ?

## 2021-09-27 NOTE — Telephone Encounter (Signed)
Amy will get CBC and iron next week when she visits pt ?

## 2021-09-28 ENCOUNTER — Telehealth: Payer: Self-pay | Admitting: Family Medicine

## 2021-09-28 DIAGNOSIS — I10 Essential (primary) hypertension: Secondary | ICD-10-CM

## 2021-09-28 DIAGNOSIS — F03C0 Unspecified dementia, severe, without behavioral disturbance, psychotic disturbance, mood disturbance, and anxiety: Secondary | ICD-10-CM

## 2021-09-28 NOTE — Telephone Encounter (Signed)
Please ok those verbal orders  

## 2021-09-28 NOTE — Telephone Encounter (Signed)
Home Health verbal orders ?Caller Name:Cecila ?Agency Name: [facility] ? ?Callback number: [phone] ? ?Requesting OT/PT/Skilled nursing/Social Work/Speech: ? ?Reason:PT ? ?Frequency: 1 w x 3 ? ?Please forward to [facility] pool or providers CMA ? ?

## 2021-09-28 NOTE — Telephone Encounter (Signed)
Verbal order given  

## 2021-10-01 ENCOUNTER — Encounter: Payer: Self-pay | Admitting: Family Medicine

## 2021-10-01 DIAGNOSIS — D649 Anemia, unspecified: Secondary | ICD-10-CM | POA: Diagnosis not present

## 2021-10-01 DIAGNOSIS — N1832 Chronic kidney disease, stage 3b: Secondary | ICD-10-CM | POA: Diagnosis not present

## 2021-10-01 DIAGNOSIS — I129 Hypertensive chronic kidney disease with stage 1 through stage 4 chronic kidney disease, or unspecified chronic kidney disease: Secondary | ICD-10-CM | POA: Diagnosis not present

## 2021-10-01 DIAGNOSIS — A419 Sepsis, unspecified organism: Secondary | ICD-10-CM | POA: Diagnosis not present

## 2021-10-01 DIAGNOSIS — N390 Urinary tract infection, site not specified: Secondary | ICD-10-CM | POA: Diagnosis not present

## 2021-10-01 DIAGNOSIS — D631 Anemia in chronic kidney disease: Secondary | ICD-10-CM | POA: Diagnosis not present

## 2021-10-01 DIAGNOSIS — B9620 Unspecified Escherichia coli [E. coli] as the cause of diseases classified elsewhere: Secondary | ICD-10-CM | POA: Diagnosis not present

## 2021-10-03 DIAGNOSIS — N1832 Chronic kidney disease, stage 3b: Secondary | ICD-10-CM | POA: Diagnosis not present

## 2021-10-03 DIAGNOSIS — D631 Anemia in chronic kidney disease: Secondary | ICD-10-CM | POA: Diagnosis not present

## 2021-10-03 DIAGNOSIS — A419 Sepsis, unspecified organism: Secondary | ICD-10-CM | POA: Diagnosis not present

## 2021-10-03 DIAGNOSIS — B9620 Unspecified Escherichia coli [E. coli] as the cause of diseases classified elsewhere: Secondary | ICD-10-CM | POA: Diagnosis not present

## 2021-10-03 DIAGNOSIS — I129 Hypertensive chronic kidney disease with stage 1 through stage 4 chronic kidney disease, or unspecified chronic kidney disease: Secondary | ICD-10-CM | POA: Diagnosis not present

## 2021-10-03 DIAGNOSIS — N390 Urinary tract infection, site not specified: Secondary | ICD-10-CM | POA: Diagnosis not present

## 2021-10-08 DIAGNOSIS — B9620 Unspecified Escherichia coli [E. coli] as the cause of diseases classified elsewhere: Secondary | ICD-10-CM | POA: Diagnosis not present

## 2021-10-08 DIAGNOSIS — N1832 Chronic kidney disease, stage 3b: Secondary | ICD-10-CM | POA: Diagnosis not present

## 2021-10-08 DIAGNOSIS — A419 Sepsis, unspecified organism: Secondary | ICD-10-CM | POA: Diagnosis not present

## 2021-10-08 DIAGNOSIS — I129 Hypertensive chronic kidney disease with stage 1 through stage 4 chronic kidney disease, or unspecified chronic kidney disease: Secondary | ICD-10-CM | POA: Diagnosis not present

## 2021-10-08 DIAGNOSIS — D631 Anemia in chronic kidney disease: Secondary | ICD-10-CM | POA: Diagnosis not present

## 2021-10-08 DIAGNOSIS — N390 Urinary tract infection, site not specified: Secondary | ICD-10-CM | POA: Diagnosis not present

## 2021-10-09 DIAGNOSIS — B9620 Unspecified Escherichia coli [E. coli] as the cause of diseases classified elsewhere: Secondary | ICD-10-CM | POA: Diagnosis not present

## 2021-10-09 DIAGNOSIS — D631 Anemia in chronic kidney disease: Secondary | ICD-10-CM | POA: Diagnosis not present

## 2021-10-09 DIAGNOSIS — A419 Sepsis, unspecified organism: Secondary | ICD-10-CM | POA: Diagnosis not present

## 2021-10-09 DIAGNOSIS — N1832 Chronic kidney disease, stage 3b: Secondary | ICD-10-CM | POA: Diagnosis not present

## 2021-10-09 DIAGNOSIS — I129 Hypertensive chronic kidney disease with stage 1 through stage 4 chronic kidney disease, or unspecified chronic kidney disease: Secondary | ICD-10-CM | POA: Diagnosis not present

## 2021-10-09 DIAGNOSIS — N390 Urinary tract infection, site not specified: Secondary | ICD-10-CM | POA: Diagnosis not present

## 2021-10-09 NOTE — Telephone Encounter (Signed)
Dr. Glori Bickers received labs and said: ? ?"Anemia is improved! If clinically stable, please recheck cbc and iron in one month. (Sooner if bleeding or new sxs)" ? ?Called Amy (RN) and left VM letting her know and to recheck labs in one month or sooner if bleeding or new sxs ?

## 2021-10-13 ENCOUNTER — Other Ambulatory Visit: Payer: Self-pay | Admitting: Family Medicine

## 2021-10-13 ENCOUNTER — Other Ambulatory Visit: Payer: Self-pay | Admitting: Neurology

## 2021-10-17 ENCOUNTER — Telehealth: Payer: Self-pay

## 2021-10-17 DIAGNOSIS — D631 Anemia in chronic kidney disease: Secondary | ICD-10-CM | POA: Diagnosis not present

## 2021-10-17 DIAGNOSIS — B9620 Unspecified Escherichia coli [E. coli] as the cause of diseases classified elsewhere: Secondary | ICD-10-CM | POA: Diagnosis not present

## 2021-10-17 DIAGNOSIS — N390 Urinary tract infection, site not specified: Secondary | ICD-10-CM | POA: Diagnosis not present

## 2021-10-17 DIAGNOSIS — N1832 Chronic kidney disease, stage 3b: Secondary | ICD-10-CM | POA: Diagnosis not present

## 2021-10-17 DIAGNOSIS — A419 Sepsis, unspecified organism: Secondary | ICD-10-CM | POA: Diagnosis not present

## 2021-10-17 DIAGNOSIS — I129 Hypertensive chronic kidney disease with stage 1 through stage 4 chronic kidney disease, or unspecified chronic kidney disease: Secondary | ICD-10-CM | POA: Diagnosis not present

## 2021-10-17 NOTE — Telephone Encounter (Signed)
Tillie Rung with [facility] requesting verbal orders to extend pt's PT: ? ?Once a week x 8 weeks ? ?Call back is secure: [phone] ?

## 2021-10-17 NOTE — Telephone Encounter (Signed)
Please ok those verbal orders  

## 2021-10-17 NOTE — Telephone Encounter (Signed)
Left VM giving VO ?

## 2021-10-21 DIAGNOSIS — M069 Rheumatoid arthritis, unspecified: Secondary | ICD-10-CM | POA: Diagnosis not present

## 2021-10-21 DIAGNOSIS — F0394 Unspecified dementia, unspecified severity, with anxiety: Secondary | ICD-10-CM | POA: Diagnosis not present

## 2021-10-21 DIAGNOSIS — N1832 Chronic kidney disease, stage 3b: Secondary | ICD-10-CM | POA: Diagnosis not present

## 2021-10-21 DIAGNOSIS — Z8616 Personal history of COVID-19: Secondary | ICD-10-CM | POA: Diagnosis not present

## 2021-10-21 DIAGNOSIS — Z86718 Personal history of other venous thrombosis and embolism: Secondary | ICD-10-CM | POA: Diagnosis not present

## 2021-10-21 DIAGNOSIS — M810 Age-related osteoporosis without current pathological fracture: Secondary | ICD-10-CM | POA: Diagnosis not present

## 2021-10-21 DIAGNOSIS — Z8673 Personal history of transient ischemic attack (TIA), and cerebral infarction without residual deficits: Secondary | ICD-10-CM | POA: Diagnosis not present

## 2021-10-21 DIAGNOSIS — I720 Aneurysm of carotid artery: Secondary | ICD-10-CM | POA: Diagnosis not present

## 2021-10-21 DIAGNOSIS — K449 Diaphragmatic hernia without obstruction or gangrene: Secondary | ICD-10-CM | POA: Diagnosis not present

## 2021-10-21 DIAGNOSIS — K625 Hemorrhage of anus and rectum: Secondary | ICD-10-CM | POA: Diagnosis not present

## 2021-10-21 DIAGNOSIS — I129 Hypertensive chronic kidney disease with stage 1 through stage 4 chronic kidney disease, or unspecified chronic kidney disease: Secondary | ICD-10-CM | POA: Diagnosis not present

## 2021-10-21 DIAGNOSIS — D509 Iron deficiency anemia, unspecified: Secondary | ICD-10-CM | POA: Diagnosis not present

## 2021-10-21 DIAGNOSIS — H259 Unspecified age-related cataract: Secondary | ICD-10-CM | POA: Diagnosis not present

## 2021-10-21 DIAGNOSIS — M1990 Unspecified osteoarthritis, unspecified site: Secondary | ICD-10-CM | POA: Diagnosis not present

## 2021-10-21 DIAGNOSIS — Z87440 Personal history of urinary (tract) infections: Secondary | ICD-10-CM | POA: Diagnosis not present

## 2021-10-21 DIAGNOSIS — G4700 Insomnia, unspecified: Secondary | ICD-10-CM | POA: Diagnosis not present

## 2021-10-21 DIAGNOSIS — E785 Hyperlipidemia, unspecified: Secondary | ICD-10-CM | POA: Diagnosis not present

## 2021-10-21 DIAGNOSIS — D631 Anemia in chronic kidney disease: Secondary | ICD-10-CM | POA: Diagnosis not present

## 2021-10-25 DIAGNOSIS — D631 Anemia in chronic kidney disease: Secondary | ICD-10-CM | POA: Diagnosis not present

## 2021-10-25 DIAGNOSIS — I129 Hypertensive chronic kidney disease with stage 1 through stage 4 chronic kidney disease, or unspecified chronic kidney disease: Secondary | ICD-10-CM | POA: Diagnosis not present

## 2021-10-25 DIAGNOSIS — E785 Hyperlipidemia, unspecified: Secondary | ICD-10-CM | POA: Diagnosis not present

## 2021-10-25 DIAGNOSIS — D509 Iron deficiency anemia, unspecified: Secondary | ICD-10-CM | POA: Diagnosis not present

## 2021-10-25 DIAGNOSIS — K625 Hemorrhage of anus and rectum: Secondary | ICD-10-CM | POA: Diagnosis not present

## 2021-10-25 DIAGNOSIS — N1832 Chronic kidney disease, stage 3b: Secondary | ICD-10-CM | POA: Diagnosis not present

## 2021-10-26 DIAGNOSIS — K625 Hemorrhage of anus and rectum: Secondary | ICD-10-CM | POA: Diagnosis not present

## 2021-10-26 DIAGNOSIS — D631 Anemia in chronic kidney disease: Secondary | ICD-10-CM | POA: Diagnosis not present

## 2021-10-26 DIAGNOSIS — I129 Hypertensive chronic kidney disease with stage 1 through stage 4 chronic kidney disease, or unspecified chronic kidney disease: Secondary | ICD-10-CM | POA: Diagnosis not present

## 2021-10-26 DIAGNOSIS — N1832 Chronic kidney disease, stage 3b: Secondary | ICD-10-CM | POA: Diagnosis not present

## 2021-10-26 DIAGNOSIS — E785 Hyperlipidemia, unspecified: Secondary | ICD-10-CM | POA: Diagnosis not present

## 2021-10-26 DIAGNOSIS — D509 Iron deficiency anemia, unspecified: Secondary | ICD-10-CM | POA: Diagnosis not present

## 2021-10-28 DIAGNOSIS — D631 Anemia in chronic kidney disease: Secondary | ICD-10-CM | POA: Diagnosis not present

## 2021-10-28 DIAGNOSIS — K449 Diaphragmatic hernia without obstruction or gangrene: Secondary | ICD-10-CM | POA: Diagnosis not present

## 2021-10-28 DIAGNOSIS — M069 Rheumatoid arthritis, unspecified: Secondary | ICD-10-CM | POA: Diagnosis not present

## 2021-10-28 DIAGNOSIS — N1832 Chronic kidney disease, stage 3b: Secondary | ICD-10-CM | POA: Diagnosis not present

## 2021-10-28 DIAGNOSIS — E785 Hyperlipidemia, unspecified: Secondary | ICD-10-CM | POA: Diagnosis not present

## 2021-10-28 DIAGNOSIS — I129 Hypertensive chronic kidney disease with stage 1 through stage 4 chronic kidney disease, or unspecified chronic kidney disease: Secondary | ICD-10-CM | POA: Diagnosis not present

## 2021-10-28 DIAGNOSIS — H259 Unspecified age-related cataract: Secondary | ICD-10-CM

## 2021-10-28 DIAGNOSIS — Z8616 Personal history of COVID-19: Secondary | ICD-10-CM

## 2021-10-28 DIAGNOSIS — M810 Age-related osteoporosis without current pathological fracture: Secondary | ICD-10-CM | POA: Diagnosis not present

## 2021-10-28 DIAGNOSIS — D509 Iron deficiency anemia, unspecified: Secondary | ICD-10-CM | POA: Diagnosis not present

## 2021-10-28 DIAGNOSIS — M1990 Unspecified osteoarthritis, unspecified site: Secondary | ICD-10-CM | POA: Diagnosis not present

## 2021-10-28 DIAGNOSIS — F0394 Unspecified dementia, unspecified severity, with anxiety: Secondary | ICD-10-CM | POA: Diagnosis not present

## 2021-10-28 DIAGNOSIS — I720 Aneurysm of carotid artery: Secondary | ICD-10-CM

## 2021-10-28 DIAGNOSIS — Z8673 Personal history of transient ischemic attack (TIA), and cerebral infarction without residual deficits: Secondary | ICD-10-CM

## 2021-10-28 DIAGNOSIS — Z86718 Personal history of other venous thrombosis and embolism: Secondary | ICD-10-CM

## 2021-10-28 DIAGNOSIS — G4700 Insomnia, unspecified: Secondary | ICD-10-CM | POA: Diagnosis not present

## 2021-10-28 DIAGNOSIS — K625 Hemorrhage of anus and rectum: Secondary | ICD-10-CM | POA: Diagnosis not present

## 2021-10-28 DIAGNOSIS — Z87440 Personal history of urinary (tract) infections: Secondary | ICD-10-CM

## 2021-10-31 DIAGNOSIS — N1832 Chronic kidney disease, stage 3b: Secondary | ICD-10-CM | POA: Diagnosis not present

## 2021-10-31 DIAGNOSIS — K625 Hemorrhage of anus and rectum: Secondary | ICD-10-CM | POA: Diagnosis not present

## 2021-10-31 DIAGNOSIS — D631 Anemia in chronic kidney disease: Secondary | ICD-10-CM | POA: Diagnosis not present

## 2021-10-31 DIAGNOSIS — D509 Iron deficiency anemia, unspecified: Secondary | ICD-10-CM | POA: Diagnosis not present

## 2021-10-31 DIAGNOSIS — E785 Hyperlipidemia, unspecified: Secondary | ICD-10-CM | POA: Diagnosis not present

## 2021-10-31 DIAGNOSIS — I129 Hypertensive chronic kidney disease with stage 1 through stage 4 chronic kidney disease, or unspecified chronic kidney disease: Secondary | ICD-10-CM | POA: Diagnosis not present

## 2021-11-01 ENCOUNTER — Ambulatory Visit (INDEPENDENT_AMBULATORY_CARE_PROVIDER_SITE_OTHER): Payer: Medicare Other

## 2021-11-01 ENCOUNTER — Telehealth: Payer: Self-pay | Admitting: Family Medicine

## 2021-11-01 DIAGNOSIS — I10 Essential (primary) hypertension: Secondary | ICD-10-CM

## 2021-11-01 DIAGNOSIS — F03C0 Unspecified dementia, severe, without behavioral disturbance, psychotic disturbance, mood disturbance, and anxiety: Secondary | ICD-10-CM

## 2021-11-01 DIAGNOSIS — Z9181 History of falling: Secondary | ICD-10-CM

## 2021-11-01 NOTE — Patient Instructions (Signed)
Visit Information ? ?Thank you for taking time to visit with me today. Please don't hesitate to contact me if I can be of assistance to you before our next scheduled telephone appointment. ? ?Following are the goals we discussed today:  ?Caregiver will continue to administer medications as prescribed as evidenced by self report/primary caregiver report  ?Patient will attend all scheduled provider appointments  ?Caregiver  will call pharmacy for medication refills as evidenced by patient report and review of pharmacy fill history as appropriate ?Caregiver  will call provider office for new concerns or questions as evidenced by review of documented incoming telephone call notes and patient report ?Continue to check blood pressure  2-3 x per week and record. ?Continue to use assistive device ( walker) for ambulation ?Follow fall safety precautions: remove throw rugs, cords, wear non slip shoes, use ambulating device, and keep doors that lead outside locked.  ? ?Our next appointment is by telephone on 01/04/22 at 1:00 pm ? ?Please call the care guide team at [phone] if you need to cancel or reschedule your appointment.  ? ?If you are experiencing a Mental Health or [place] or need someone to talk to, please call the Suicide and Crisis Lifeline: 988 ?call 1-800-273-TALK (toll free, 24 hour hotline)  ? ? ? ?Quinn Plowman RN,BSN,CCM ?RN Case Manager ?[place]  ?[phone] ? ?

## 2021-11-01 NOTE — Chronic Care Management (AMB) (Signed)
?Chronic Care Management  ? ?CCM RN Visit Note ? ?11/01/2021 ?Name: Wendy Krueger MRN: [id number] DOB: [birth year] ? ?Subjective: ?Wendy Krueger is a 88 y.o. year old female who is a primary care patient of Tower, Wynelle Fanny, MD. The care management team was consulted for assistance with disease management and care coordination needs.   ? ?Engaged with patient by telephone for follow up visit in response to provider referral for case management and/or care coordination services.  ? ?Consent to Services:  ?The patient was given information about Chronic Care Management services, agreed to services, and gave verbal consent prior to initiation of services.  Please see initial visit note for detailed documentation.  ? ?Patient agreed to services and verbal consent obtained.  ? ?Assessment: Review of patient past medical history, allergies, medications, health status, including review of consultants reports, laboratory and other test data, was performed as part of comprehensive evaluation and provision of chronic care management services.  ? ?SDOH (Social Determinants of Health) assessments and interventions performed:   ? ?CCM Care Plan ? ?Allergies  ?Allergen Reactions  ? Alendronate Sodium Other (See Comments)  ?  Leg pain   ? Amlodipine Besylate Hives  ? Calcitonin ([place]) Other (See Comments)  ?   headache/ head pressure  ? Plavix [Clopidogrel Bisulfate] Other (See Comments)  ?  drowsy ?   ? Simvastatin Other (See Comments)  ?  Leg pain   ? ? ?Outpatient Encounter Medications as of 11/01/2021  ?Medication Sig  ? acetaminophen (TYLENOL) 500 MG tablet Take 500 mg by mouth every 6 (six) hours as needed.  ? Chlorpheniramine Maleate (ALLERGY PO) Take 1 tablet by mouth daily.   ? donepezil (ARICEPT) 10 MG tablet TAKE 1 TABLET BY MOUTH AT BEDTIME  ? hydrocortisone (ANUSOL-HC) 25 MG suppository Place 1 suppository (25 mg total) rectally 2 (two) times daily.  ? iron polysaccharides (NIFEREX) 150 MG capsule TAKE 1 CAPSULE  (150 MG TOTAL) BY MOUTH EVERY OTHER DAY.  ? KLOR-CON M10 10 MEQ tablet Take 20 mEq by mouth daily.  ? losartan (COZAAR) 100 MG tablet Take 0.5 tablets (50 mg total) by mouth daily.  ? Melatonin 10 MG TABS Take 10 mg by mouth at bedtime.  ? memantine (NAMENDA) 10 MG tablet TAKE 1 TABLET BY MOUTH TWICE A DAY  ? metoprolol tartrate (LOPRESSOR) 25 MG tablet TAKE 1 TABLET BY MOUTH TWICE A DAY  ? Multiple Vitamins-Minerals ([employer]S SPECTRAVITE PO) Take 1 capsule by mouth daily.  ? polyethylene glycol (MIRALAX / GLYCOLAX) 17 g packet Take 17 g by mouth daily as needed.  ? Probiotic Product (PROBIOTIC PO) Take 1 tablet by mouth every other day.   ? QUEtiapine (SEROQUEL) 25 MG tablet TAKE 1/2-1 TABLET BY MOUTH AS NEEDED IN THE EVENING FOR AGITATION/HALLUCINATIONS  ? ?No facility-administered encounter medications on file as of 11/01/2021.  ? ? ?Patient Active Problem List  ? Diagnosis Date Noted  ? Palliative care patient 09/18/2021  ? SOB (shortness of breath) 06/20/2021  ? Closed right hip fracture ([place]) 12/07/2019  ? Pedal edema 08/13/2019  ? Polyp of colon   ? Chronic renal failure, stage 3b ([place])   ? Bleeding hemorrhoids 08/06/2019  ? History of DVT (deep vein thrombosis) 08/06/2019  ? Advanced dementia ([place]) 08/02/2019  ? Abnormal CT of the abdomen   ? External hemorrhoids   ? Transient alteration of awareness 07/14/2018  ? History of fracture of radius 02/17/2018  ? Hypokalemia 02/09/2018  ? History of lower GI  bleeding 02/09/2018  ? Acute blood loss anemia 02/09/2018  ? History of CVA (cerebrovascular accident) 03/10/2016  ? Carotid aneurysm, right ([place]) 02/21/2016  ? Lipoma of shoulder 03/27/2015  ? History of closed Colles' fracture 09/06/2014  ? Allergic rhinitis 06/20/2014  ? Diastolic dysfunction 73/22/0254  ? Iron deficiency anemia 03/25/2014  ? History of GI bleed 03/21/2014  ? Encounter for Medicare annual wellness exam 01/08/2013  ? Gallstones 11/11/2011  ? Arterial ischemic stroke, 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  ? Risk for  falls 06/04/2011  ? Constipation 03/05/2011  ? Vitamin D deficiency 03/28/2009  ? HYPERCHOLESTEROLEMIA, PURE 03/17/2007  ? Essential hypertension 03/16/2007  ? OSTEOARTHRITIS 03/16/2007  ? Osteoporosis 03/16/2007  ? ? ?Conditions to be addressed/monitored:HTN, Dementia, and Falls ? ?Care Plan : RNCM Plan of care  ?Updates made by Dannielle Karvonen, RN since 11/01/2021 12:00 AM  ?  ? ?Problem: Chronic disease managemet, education and / or care coordination needs.   ?Priority: High  ?  ? ?Long-Range Goal: Development of plan of care to address chronic disease management and / or care coordination needs.   ?Start Date: 05/18/2021  ?Expected End Date: 01/28/2022  ?Priority: High  ?Note:   ?Current Barriers:  ?Knowledge Deficits related to plan of care for management of HTN and Dementia  ?Chronic Disease Management support and education needs related to HTN and Dementia ?Cognitive Deficits:  Dementia ?Call to daughter, [name]. HIPAA verified by daughter for patient.   Daughter states  patient admitted to the hospital on 09/12/21 for bleeding hemorrhoids. She reports patient having hospital follow up with primary care provider on 09/18/21 with no changes in treatment plan.  Daughter states patient continues to have home health and palliative care services.  Daughter reports patient's blood pressure are occasionally running higher than normal.  She reports patients blood pressure range from 140's/70's to 180's /80's.      ?RNCM Clinical Goal(s):  ?Patient / caregiver will verbalize understanding of plan for management of HTN and Dementia ?take all medications exactly as prescribed and will call provider for medication related questions ?attend all scheduled medical appointments:  ?continue to work with RN Care Manager to address care management and care coordination needs related to  HTN and Dementia through collaboration with RN Care manager, provider, and care team.  ? ?Interventions: ?1:1 collaboration with primary care  provider regarding development and update of comprehensive plan of care as evidenced by provider attestation and co-signature ?Inter-disciplinary care team collaboration (see longitudinal plan of care) ?Evaluation of current treatment plan related to  self management and patient's adherence to plan as established by provider ? ?Hypertension Interventions: Goal on track: Yes, Long term ?Last practice recorded BP readings:  ?BP Readings from Last 3 Encounters:  ?09/18/21 130/78  ?09/13/21 136/72  ?07/03/21 136/82  ?Most recent eGFR/CrCl: No results found for: EGFR  No components found for: CRCL ? ?Evaluation of current treatment plan related to hypertension self management and patient's adherence to plan as established by provider; ?Reviewed medications with caregiver and discussed importance of compliance; ?Discussed plans with caregiver for ongoing care management follow up and provided caregiver with direct contact information for care management team; ?Reviewed scheduled/upcoming provider appointments   ?Advised caregiver to continue monitoring patients blood pressure at least 2-3 times per week and record, calling PCP for findings outside established parameters. ?Discussed importance of following low salt/ DASH diet ? ?Dementia / Falls interventions: Goal on track:  Yes,  Long term ?Discussed plans with caregiver for ongoing care management  follow up and provided caregiver with direct contact information for care management team ?Evaluation of current treatment plan related to Dementia and patient's adherence to plan as established by provider; ?Reviewed medications with caregiver  and discussed ?Reviewed scheduled/upcoming provider appointments ?Discussed fall precautions / home safety  ?Assessed for falls ? ?Patient Goals/Self-Care Activities: ?Caregiver will continue to administer medications as prescribed as evidenced by self report/primary caregiver report  ?Patient will attend all scheduled provider  appointments  ?Caregiver  will call pharmacy for medication refills as evidenced by patient report and review of pharmacy fill history as appropriate ?Caregiver  will call provider office for new concerns or q

## 2021-11-01 NOTE — Telephone Encounter (Signed)
Amy with [facility] called and said she was trying to get the blood from this pt but couldn't get it because she couldn't find a vein, she said shes going to send another order for blood work.  ?

## 2021-11-02 NOTE — Telephone Encounter (Signed)
Sorry, I did not understand, they are going to try again or need another order? ?

## 2021-11-05 ENCOUNTER — Other Ambulatory Visit: Payer: Self-pay | Admitting: Family Medicine

## 2021-11-06 DIAGNOSIS — E785 Hyperlipidemia, unspecified: Secondary | ICD-10-CM | POA: Diagnosis not present

## 2021-11-06 DIAGNOSIS — D509 Iron deficiency anemia, unspecified: Secondary | ICD-10-CM | POA: Diagnosis not present

## 2021-11-06 DIAGNOSIS — D631 Anemia in chronic kidney disease: Secondary | ICD-10-CM | POA: Diagnosis not present

## 2021-11-06 DIAGNOSIS — K625 Hemorrhage of anus and rectum: Secondary | ICD-10-CM | POA: Diagnosis not present

## 2021-11-06 DIAGNOSIS — D649 Anemia, unspecified: Secondary | ICD-10-CM | POA: Diagnosis not present

## 2021-11-06 DIAGNOSIS — I129 Hypertensive chronic kidney disease with stage 1 through stage 4 chronic kidney disease, or unspecified chronic kidney disease: Secondary | ICD-10-CM | POA: Diagnosis not present

## 2021-11-06 DIAGNOSIS — N1832 Chronic kidney disease, stage 3b: Secondary | ICD-10-CM | POA: Diagnosis not present

## 2021-11-06 LAB — IRON,TIBC AND FERRITIN PANEL: %SAT: 15

## 2021-11-06 LAB — CBC AND DIFFERENTIAL
HCT: 37 (ref 36–46)
Hemoglobin: 11.7 — AB (ref 12.0–16.0)

## 2021-11-07 DIAGNOSIS — I129 Hypertensive chronic kidney disease with stage 1 through stage 4 chronic kidney disease, or unspecified chronic kidney disease: Secondary | ICD-10-CM | POA: Diagnosis not present

## 2021-11-07 DIAGNOSIS — D631 Anemia in chronic kidney disease: Secondary | ICD-10-CM | POA: Diagnosis not present

## 2021-11-07 DIAGNOSIS — D509 Iron deficiency anemia, unspecified: Secondary | ICD-10-CM | POA: Diagnosis not present

## 2021-11-07 DIAGNOSIS — N1832 Chronic kidney disease, stage 3b: Secondary | ICD-10-CM | POA: Diagnosis not present

## 2021-11-07 DIAGNOSIS — E785 Hyperlipidemia, unspecified: Secondary | ICD-10-CM | POA: Diagnosis not present

## 2021-11-07 DIAGNOSIS — K625 Hemorrhage of anus and rectum: Secondary | ICD-10-CM | POA: Diagnosis not present

## 2021-11-12 ENCOUNTER — Other Ambulatory Visit: Payer: Self-pay | Admitting: Neurology

## 2021-11-12 DIAGNOSIS — K625 Hemorrhage of anus and rectum: Secondary | ICD-10-CM | POA: Diagnosis not present

## 2021-11-12 DIAGNOSIS — N1832 Chronic kidney disease, stage 3b: Secondary | ICD-10-CM | POA: Diagnosis not present

## 2021-11-12 DIAGNOSIS — D509 Iron deficiency anemia, unspecified: Secondary | ICD-10-CM | POA: Diagnosis not present

## 2021-11-12 DIAGNOSIS — E785 Hyperlipidemia, unspecified: Secondary | ICD-10-CM | POA: Diagnosis not present

## 2021-11-12 DIAGNOSIS — D631 Anemia in chronic kidney disease: Secondary | ICD-10-CM | POA: Diagnosis not present

## 2021-11-12 DIAGNOSIS — I129 Hypertensive chronic kidney disease with stage 1 through stage 4 chronic kidney disease, or unspecified chronic kidney disease: Secondary | ICD-10-CM | POA: Diagnosis not present

## 2021-11-13 ENCOUNTER — Telehealth: Payer: Self-pay

## 2021-11-13 ENCOUNTER — Encounter: Payer: Self-pay | Admitting: Family Medicine

## 2021-11-13 NOTE — Telephone Encounter (Signed)
Called and spoke with pt 's daughter [name] regarding lab results per Dr Glori Bickers , her anemia is improved and ,no changes to her medication. Recheck  CBC and Iron in 1 month. She understood results w/o any concerns.  Copy of lab results are scan in patient chart. ?

## 2021-11-14 DIAGNOSIS — D509 Iron deficiency anemia, unspecified: Secondary | ICD-10-CM | POA: Diagnosis not present

## 2021-11-14 DIAGNOSIS — I129 Hypertensive chronic kidney disease with stage 1 through stage 4 chronic kidney disease, or unspecified chronic kidney disease: Secondary | ICD-10-CM | POA: Diagnosis not present

## 2021-11-14 DIAGNOSIS — K625 Hemorrhage of anus and rectum: Secondary | ICD-10-CM | POA: Diagnosis not present

## 2021-11-14 DIAGNOSIS — N1832 Chronic kidney disease, stage 3b: Secondary | ICD-10-CM | POA: Diagnosis not present

## 2021-11-14 DIAGNOSIS — E785 Hyperlipidemia, unspecified: Secondary | ICD-10-CM | POA: Diagnosis not present

## 2021-11-14 DIAGNOSIS — D631 Anemia in chronic kidney disease: Secondary | ICD-10-CM | POA: Diagnosis not present

## 2021-11-20 DIAGNOSIS — M1990 Unspecified osteoarthritis, unspecified site: Secondary | ICD-10-CM | POA: Diagnosis not present

## 2021-11-20 DIAGNOSIS — G4700 Insomnia, unspecified: Secondary | ICD-10-CM | POA: Diagnosis not present

## 2021-11-20 DIAGNOSIS — F0394 Unspecified dementia, unspecified severity, with anxiety: Secondary | ICD-10-CM | POA: Diagnosis not present

## 2021-11-20 DIAGNOSIS — Z86718 Personal history of other venous thrombosis and embolism: Secondary | ICD-10-CM | POA: Diagnosis not present

## 2021-11-20 DIAGNOSIS — Z8616 Personal history of COVID-19: Secondary | ICD-10-CM | POA: Diagnosis not present

## 2021-11-20 DIAGNOSIS — H259 Unspecified age-related cataract: Secondary | ICD-10-CM | POA: Diagnosis not present

## 2021-11-20 DIAGNOSIS — D509 Iron deficiency anemia, unspecified: Secondary | ICD-10-CM | POA: Diagnosis not present

## 2021-11-20 DIAGNOSIS — I720 Aneurysm of carotid artery: Secondary | ICD-10-CM | POA: Diagnosis not present

## 2021-11-20 DIAGNOSIS — K449 Diaphragmatic hernia without obstruction or gangrene: Secondary | ICD-10-CM | POA: Diagnosis not present

## 2021-11-20 DIAGNOSIS — M069 Rheumatoid arthritis, unspecified: Secondary | ICD-10-CM | POA: Diagnosis not present

## 2021-11-20 DIAGNOSIS — D631 Anemia in chronic kidney disease: Secondary | ICD-10-CM | POA: Diagnosis not present

## 2021-11-20 DIAGNOSIS — K625 Hemorrhage of anus and rectum: Secondary | ICD-10-CM | POA: Diagnosis not present

## 2021-11-20 DIAGNOSIS — N1832 Chronic kidney disease, stage 3b: Secondary | ICD-10-CM | POA: Diagnosis not present

## 2021-11-20 DIAGNOSIS — E785 Hyperlipidemia, unspecified: Secondary | ICD-10-CM | POA: Diagnosis not present

## 2021-11-20 DIAGNOSIS — I129 Hypertensive chronic kidney disease with stage 1 through stage 4 chronic kidney disease, or unspecified chronic kidney disease: Secondary | ICD-10-CM | POA: Diagnosis not present

## 2021-11-20 DIAGNOSIS — Z87440 Personal history of urinary (tract) infections: Secondary | ICD-10-CM | POA: Diagnosis not present

## 2021-11-20 DIAGNOSIS — Z8673 Personal history of transient ischemic attack (TIA), and cerebral infarction without residual deficits: Secondary | ICD-10-CM | POA: Diagnosis not present

## 2021-11-20 DIAGNOSIS — M810 Age-related osteoporosis without current pathological fracture: Secondary | ICD-10-CM | POA: Diagnosis not present

## 2021-11-22 DIAGNOSIS — N1832 Chronic kidney disease, stage 3b: Secondary | ICD-10-CM | POA: Diagnosis not present

## 2021-11-22 DIAGNOSIS — D631 Anemia in chronic kidney disease: Secondary | ICD-10-CM | POA: Diagnosis not present

## 2021-11-22 DIAGNOSIS — D509 Iron deficiency anemia, unspecified: Secondary | ICD-10-CM | POA: Diagnosis not present

## 2021-11-22 DIAGNOSIS — K625 Hemorrhage of anus and rectum: Secondary | ICD-10-CM | POA: Diagnosis not present

## 2021-11-22 DIAGNOSIS — E785 Hyperlipidemia, unspecified: Secondary | ICD-10-CM | POA: Diagnosis not present

## 2021-11-22 DIAGNOSIS — I129 Hypertensive chronic kidney disease with stage 1 through stage 4 chronic kidney disease, or unspecified chronic kidney disease: Secondary | ICD-10-CM | POA: Diagnosis not present

## 2021-11-24 DIAGNOSIS — E785 Hyperlipidemia, unspecified: Secondary | ICD-10-CM | POA: Diagnosis not present

## 2021-11-24 DIAGNOSIS — K625 Hemorrhage of anus and rectum: Secondary | ICD-10-CM | POA: Diagnosis not present

## 2021-11-24 DIAGNOSIS — D509 Iron deficiency anemia, unspecified: Secondary | ICD-10-CM | POA: Diagnosis not present

## 2021-11-24 DIAGNOSIS — D631 Anemia in chronic kidney disease: Secondary | ICD-10-CM | POA: Diagnosis not present

## 2021-11-24 DIAGNOSIS — N1832 Chronic kidney disease, stage 3b: Secondary | ICD-10-CM | POA: Diagnosis not present

## 2021-11-24 DIAGNOSIS — I129 Hypertensive chronic kidney disease with stage 1 through stage 4 chronic kidney disease, or unspecified chronic kidney disease: Secondary | ICD-10-CM | POA: Diagnosis not present

## 2021-11-28 DIAGNOSIS — F0390 Unspecified dementia without behavioral disturbance: Secondary | ICD-10-CM

## 2021-11-28 DIAGNOSIS — I10 Essential (primary) hypertension: Secondary | ICD-10-CM

## 2021-11-30 DIAGNOSIS — D631 Anemia in chronic kidney disease: Secondary | ICD-10-CM | POA: Diagnosis not present

## 2021-11-30 DIAGNOSIS — I129 Hypertensive chronic kidney disease with stage 1 through stage 4 chronic kidney disease, or unspecified chronic kidney disease: Secondary | ICD-10-CM | POA: Diagnosis not present

## 2021-11-30 DIAGNOSIS — E785 Hyperlipidemia, unspecified: Secondary | ICD-10-CM | POA: Diagnosis not present

## 2021-11-30 DIAGNOSIS — N1832 Chronic kidney disease, stage 3b: Secondary | ICD-10-CM | POA: Diagnosis not present

## 2021-11-30 DIAGNOSIS — K625 Hemorrhage of anus and rectum: Secondary | ICD-10-CM | POA: Diagnosis not present

## 2021-11-30 DIAGNOSIS — D509 Iron deficiency anemia, unspecified: Secondary | ICD-10-CM | POA: Diagnosis not present

## 2021-12-05 DIAGNOSIS — D631 Anemia in chronic kidney disease: Secondary | ICD-10-CM | POA: Diagnosis not present

## 2021-12-05 DIAGNOSIS — I129 Hypertensive chronic kidney disease with stage 1 through stage 4 chronic kidney disease, or unspecified chronic kidney disease: Secondary | ICD-10-CM | POA: Diagnosis not present

## 2021-12-05 DIAGNOSIS — K625 Hemorrhage of anus and rectum: Secondary | ICD-10-CM | POA: Diagnosis not present

## 2021-12-05 DIAGNOSIS — E785 Hyperlipidemia, unspecified: Secondary | ICD-10-CM | POA: Diagnosis not present

## 2021-12-05 DIAGNOSIS — N1832 Chronic kidney disease, stage 3b: Secondary | ICD-10-CM | POA: Diagnosis not present

## 2021-12-05 DIAGNOSIS — D509 Iron deficiency anemia, unspecified: Secondary | ICD-10-CM | POA: Diagnosis not present

## 2021-12-06 ENCOUNTER — Other Ambulatory Visit: Payer: Self-pay | Admitting: Neurology

## 2021-12-06 DIAGNOSIS — I129 Hypertensive chronic kidney disease with stage 1 through stage 4 chronic kidney disease, or unspecified chronic kidney disease: Secondary | ICD-10-CM | POA: Diagnosis not present

## 2021-12-06 DIAGNOSIS — N1832 Chronic kidney disease, stage 3b: Secondary | ICD-10-CM | POA: Diagnosis not present

## 2021-12-06 DIAGNOSIS — D631 Anemia in chronic kidney disease: Secondary | ICD-10-CM | POA: Diagnosis not present

## 2021-12-06 DIAGNOSIS — E785 Hyperlipidemia, unspecified: Secondary | ICD-10-CM | POA: Diagnosis not present

## 2021-12-06 DIAGNOSIS — D509 Iron deficiency anemia, unspecified: Secondary | ICD-10-CM | POA: Diagnosis not present

## 2021-12-06 DIAGNOSIS — K625 Hemorrhage of anus and rectum: Secondary | ICD-10-CM | POA: Diagnosis not present

## 2021-12-08 ENCOUNTER — Other Ambulatory Visit: Payer: Self-pay | Admitting: Family Medicine

## 2021-12-10 MED ORDER — MEMANTINE HCL 10 MG PO TABS: 10.0000 mg | ORAL_TABLET | Freq: Two times a day (BID) | ORAL | 3 refills | Status: AC

## 2021-12-10 NOTE — Telephone Encounter (Signed)
Please let family know I sent the [facility]

## 2021-12-10 NOTE — Telephone Encounter (Signed)
Wendy Krueger-patient's daughter, was advised earlier this will be looked at shortly and taking care of.

## 2021-12-11 ENCOUNTER — Ambulatory Visit: Payer: Medicare Other | Admitting: Nurse Practitioner

## 2021-12-11 DIAGNOSIS — D631 Anemia in chronic kidney disease: Secondary | ICD-10-CM | POA: Diagnosis not present

## 2021-12-11 DIAGNOSIS — N1832 Chronic kidney disease, stage 3b: Secondary | ICD-10-CM | POA: Diagnosis not present

## 2021-12-11 DIAGNOSIS — I129 Hypertensive chronic kidney disease with stage 1 through stage 4 chronic kidney disease, or unspecified chronic kidney disease: Secondary | ICD-10-CM | POA: Diagnosis not present

## 2021-12-11 DIAGNOSIS — E785 Hyperlipidemia, unspecified: Secondary | ICD-10-CM | POA: Diagnosis not present

## 2021-12-11 DIAGNOSIS — K625 Hemorrhage of anus and rectum: Secondary | ICD-10-CM | POA: Diagnosis not present

## 2021-12-11 DIAGNOSIS — D509 Iron deficiency anemia, unspecified: Secondary | ICD-10-CM | POA: Diagnosis not present

## 2021-12-12 DIAGNOSIS — N1832 Chronic kidney disease, stage 3b: Secondary | ICD-10-CM | POA: Diagnosis not present

## 2021-12-12 DIAGNOSIS — K625 Hemorrhage of anus and rectum: Secondary | ICD-10-CM | POA: Diagnosis not present

## 2021-12-12 DIAGNOSIS — I129 Hypertensive chronic kidney disease with stage 1 through stage 4 chronic kidney disease, or unspecified chronic kidney disease: Secondary | ICD-10-CM | POA: Diagnosis not present

## 2021-12-12 DIAGNOSIS — E785 Hyperlipidemia, unspecified: Secondary | ICD-10-CM | POA: Diagnosis not present

## 2021-12-12 DIAGNOSIS — D509 Iron deficiency anemia, unspecified: Secondary | ICD-10-CM | POA: Diagnosis not present

## 2021-12-12 DIAGNOSIS — D631 Anemia in chronic kidney disease: Secondary | ICD-10-CM | POA: Diagnosis not present

## 2021-12-18 ENCOUNTER — Telehealth: Payer: Self-pay

## 2021-12-18 DIAGNOSIS — Z515 Encounter for palliative care: Secondary | ICD-10-CM | POA: Diagnosis not present

## 2021-12-18 DIAGNOSIS — D631 Anemia in chronic kidney disease: Secondary | ICD-10-CM | POA: Diagnosis not present

## 2021-12-18 DIAGNOSIS — D500 Iron deficiency anemia secondary to blood loss (chronic): Secondary | ICD-10-CM | POA: Diagnosis not present

## 2021-12-18 DIAGNOSIS — D509 Iron deficiency anemia, unspecified: Secondary | ICD-10-CM | POA: Diagnosis not present

## 2021-12-18 DIAGNOSIS — K625 Hemorrhage of anus and rectum: Secondary | ICD-10-CM | POA: Diagnosis not present

## 2021-12-18 DIAGNOSIS — I129 Hypertensive chronic kidney disease with stage 1 through stage 4 chronic kidney disease, or unspecified chronic kidney disease: Secondary | ICD-10-CM | POA: Diagnosis not present

## 2021-12-18 DIAGNOSIS — E785 Hyperlipidemia, unspecified: Secondary | ICD-10-CM | POA: Diagnosis not present

## 2021-12-18 DIAGNOSIS — F03911 Unspecified dementia, unspecified severity, with agitation: Secondary | ICD-10-CM | POA: Diagnosis not present

## 2021-12-18 DIAGNOSIS — N1832 Chronic kidney disease, stage 3b: Secondary | ICD-10-CM | POA: Diagnosis not present

## 2021-12-18 NOTE — Telephone Encounter (Signed)
Please advise 

## 2021-12-18 NOTE — Telephone Encounter (Signed)
Aware, thanks!

## 2021-12-18 NOTE — Telephone Encounter (Signed)
Please give the verbal order for iron, cbc and ferritin for dx of iron def anemia  Thanks  Let me know if you need me to hand write that  Thanks

## 2021-12-18 NOTE — Telephone Encounter (Signed)
Called and spoke with [place] gave her the verbal order for labs for pt, she said that Palliative care will be taking over starting today , and Will with Palliative Care will be sending over information regarding the  Wendy Krueger. She was given b/p cuff  and reading will go to the Hospice care for her.

## 2021-12-18 NOTE — Telephone Encounter (Signed)
Received a call from [place] day, she is discharging the patient today and wanted to know if Dr.Tower wants her to have iron, CBC and ferratin done today at the visit. If we arent able to get back today then we can give orders to Palliative care who can draw her labs.

## 2021-12-26 ENCOUNTER — Telehealth: Payer: Self-pay

## 2021-12-26 NOTE — Telephone Encounter (Signed)
[facility] called in stating the patients blood pressure has been reading 163/103 and 163/113 with no symptoms. Wanted to advise [place] of this and if any questions to reach back out to them.

## 2021-12-26 NOTE — Telephone Encounter (Signed)
Thanks for letting me know. Glad she is not having symptoms.  She is a palliative care patient.  If her bp does not come down  (they can be labile in the past) and family wants to treat more aggressively let me know.   Often her bp goes back down on its own and she has been known to get low if we overtreat.   Schedule office visit if there are other concerns as well.

## 2021-12-26 NOTE — Telephone Encounter (Signed)
Left message on voicemail for Wendy Krueger to call the office back.

## 2021-12-26 NOTE — Telephone Encounter (Signed)
Please advise 

## 2021-12-27 NOTE — Telephone Encounter (Signed)
Will nurse with [facility] called back and notified as instructed. Will voiced understanding and said that over the past month diastolic BP has usually been above 90 but on a couple of BP readings the BP has been low. Will said that when family monitors BP at home the readings are received at palliative care also. Will will ck with family about Dr Alba Cory instruction and will have family monitor BP. Will is scheduled to see pt again on 01/08/22. Unable to reach Tobey Grim or [place] daughters of the pt. Sending note to Dr Glori Bickers after talking with Will the nurse with [facility].

## 2022-01-03 ENCOUNTER — Ambulatory Visit (INDEPENDENT_AMBULATORY_CARE_PROVIDER_SITE_OTHER): Payer: Medicare Other

## 2022-01-03 DIAGNOSIS — F03C0 Unspecified dementia, severe, without behavioral disturbance, psychotic disturbance, mood disturbance, and anxiety: Secondary | ICD-10-CM

## 2022-01-03 DIAGNOSIS — Z9181 History of falling: Secondary | ICD-10-CM

## 2022-01-03 DIAGNOSIS — I10 Essential (primary) hypertension: Secondary | ICD-10-CM

## 2022-01-03 NOTE — Chronic Care Management (AMB) (Signed)
Chronic Care Management   CCM RN Visit Note  01/03/2022 Name: Wendy Krueger MRN: [id number] DOB: Dec 14, 1929  Subjective: Wendy Krueger is a 88 y.o. year old female who is a primary care patient of Tower, Wynelle Fanny, MD. The care management team was consulted for assistance with disease management and care coordination needs.    Engaged with patient by telephone for follow up visit in response to provider referral for case management and/or care coordination services.   Consent to Services:  The patient was given information about Chronic Care Management services, agreed to services, and gave verbal consent prior to initiation of services.  Please see initial visit note for detailed documentation.   Patient agreed to services and verbal consent obtained.   Assessment: Review of patient past medical history, allergies, medications, health status, including review of consultants reports, laboratory and other test data, was performed as part of comprehensive evaluation and provision of chronic care management services.   SDOH (Social Determinants of Health) assessments and interventions performed:    CCM Care Plan  Allergies  Allergen Reactions   Alendronate Sodium Other (See Comments)    Leg pain    Amlodipine Besylate Hives   Calcitonin ([place]) Other (See Comments)     headache/ head pressure   Plavix [Clopidogrel Bisulfate] Other (See Comments)    drowsy     Simvastatin Other (See Comments)    Leg pain     Outpatient Encounter Medications as of 01/03/2022  Medication Sig   acetaminophen (TYLENOL) 500 MG tablet Take 500 mg by mouth every 6 (six) hours as needed.   Chlorpheniramine Maleate (ALLERGY PO) Take 1 tablet by mouth daily.    donepezil (ARICEPT) 10 MG tablet TAKE 1 TABLET BY MOUTH AT BEDTIME   hydrocortisone (ANUSOL-HC) 25 MG suppository Place 1 suppository (25 mg total) rectally 2 (two) times daily.   iron polysaccharides (NIFEREX) 150 MG capsule TAKE 1 CAPSULE  (150 MG TOTAL) BY MOUTH EVERY OTHER DAY.   KLOR-CON M10 10 MEQ tablet Take 20 mEq by mouth daily.   losartan (COZAAR) 100 MG tablet Take 0.5 tablets (50 mg total) by mouth daily.   Melatonin 10 MG TABS Take 10 mg by mouth at bedtime.   memantine (NAMENDA) 10 MG tablet Take 1 tablet (10 mg total) by mouth 2 (two) times daily.   metoprolol tartrate (LOPRESSOR) 25 MG tablet TAKE 1 TABLET BY MOUTH TWICE A DAY   Multiple Vitamins-Minerals ([employer] SPECTRAVITE PO) Take 1 capsule by mouth daily.   polyethylene glycol (MIRALAX / GLYCOLAX) 17 g packet Take 17 g by mouth daily as needed.   Probiotic Product (PROBIOTIC PO) Take 1 tablet by mouth every other day.    QUEtiapine (SEROQUEL) 25 MG tablet TAKE 1/2-1 TABLET BY MOUTH AS NEEDED IN THE EVENING FOR AGITATION/HALLUCINATIONS   No facility-administered encounter medications on file as of 01/03/2022.    Patient Active Problem List   Diagnosis Date Noted   Palliative care patient 09/18/2021   SOB (shortness of breath) 06/20/2021   Closed right hip fracture ([place]) 12/07/2019   Pedal edema 08/13/2019   Polyp of colon    Chronic renal failure, stage 3b ([place])    Bleeding hemorrhoids 08/06/2019   History of DVT (deep vein thrombosis) 08/06/2019   Advanced dementia ([place]) 08/02/2019   Abnormal CT of the abdomen    External hemorrhoids    Transient alteration of awareness 07/14/2018   History of fracture of radius 02/17/2018   Hypokalemia 02/09/2018  History of lower GI bleeding 02/09/2018   Acute blood loss anemia 02/09/2018   History of CVA (cerebrovascular accident) 03/10/2016   Carotid aneurysm, right ([place]) 02/21/2016   Lipoma of shoulder 03/27/2015   History of closed Colles' fracture 09/06/2014   Allergic rhinitis 24/23/5361   Diastolic dysfunction 44/31/5400   Iron deficiency anemia 03/25/2014   History of GI bleed 03/21/2014   Encounter for Medicare annual wellness exam 01/08/2013   Gallstones 11/11/2011   Arterial ischemic stroke, chronic  08/12/2011   Risk for falls 06/04/2011   Constipation 03/05/2011   Vitamin D deficiency 03/28/2009   HYPERCHOLESTEROLEMIA, PURE 03/17/2007   Essential hypertension 03/16/2007   OSTEOARTHRITIS 03/16/2007   Osteoporosis 03/16/2007    Conditions to be addressed/monitored:HTN, Dementia, and falls  Care Plan : [facility] Plan of care  Updates made by Dannielle Karvonen, RN since 01/03/2022 12:00 AM     Problem: Chronic disease managemet, education and / or care coordination needs.   Priority: High     Long-Range Goal: Development of plan of care to address chronic disease management and / or care coordination needs.   Start Date: 05/18/2021  Expected End Date: 01/28/2022  Priority: High  Note:   Current Barriers:  Knowledge Deficits related to plan of care for management of HTN and Dementia  Chronic Disease Management support and education needs related to HTN and Dementia Cognitive Deficits:  Dementia Call to daughter, [name]. HIPAA verified by daughter for patient.   Daughter states patient is doing ok.  She states patients blood pressure ranges about the same. She states patient continues to have palliative care following. Daughter states palliative care set patient up with remote blood pressure monitoring.   Daughter states she is making sure patient adheres to a low sodium diet.  Daughter states patients home health has been discontinued.  Daughter denies patient having any recent falls.   Goals reviewed with daughter. Discussed closing case management services. Daughter verbally agreed.  RNCM Clinical Goal(s):  Patient / caregiver will verbalize understanding of plan for management of HTN and Dementia take all medications exactly as prescribed and will call provider for medication related questions attend all scheduled medical appointments:  continue to work with RN Care Manager to address care management and care coordination needs related to  HTN and Dementia through collaboration  with RN Care manager, provider, and care team.   Interventions: 1:1 collaboration with primary care provider regarding development and update of comprehensive plan of care as evidenced by provider attestation and co-signature Inter-disciplinary care team collaboration (see longitudinal plan of care) Evaluation of current treatment plan related to  self management and patient's adherence to plan as established by provider  Hypertension Interventions: Goal met:  Last practice recorded BP readings:  BP Readings from Last 3 Encounters:  09/18/21 130/78  09/13/21 136/72  07/03/21 136/82  Most recent eGFR/CrCl: No results found for: "EGFR"  No components found for: "CRCL"  Evaluation of current treatment plan related to hypertension self management and patient's adherence to plan as established by provider; Reviewed medications with caregiver and discussed importance of compliance; Discussed plans with caregiver for ongoing care management follow up and provided caregiver with direct contact information for care management team; Reviewed scheduled/upcoming provider appointments   Advised caregiver to continue monitoring patients blood pressure at least 2-3 times per week and record, calling PCP for findings outside established parameters. Discussed importance of following low salt/ DASH diet  Dementia / Falls interventions: Goal Met.,  Discussed plans  with caregiver for ongoing care management follow up and provided caregiver with direct contact information for care management team Evaluation of current treatment plan related to Dementia and patient's adherence to plan as established by provider; Reviewed medications with caregiver  and discussed Reviewed scheduled/upcoming provider appointments Discussed fall precautions / home safety  Assessed for falls  Patient Goals/Self-Care Activities: Caregiver will continue to administer medications as prescribed as evidenced by self report/primary  caregiver report  Patient will attend all scheduled provider appointments  Caregiver  will call pharmacy for medication refills as evidenced by patient report and review of pharmacy fill history as appropriate Caregiver  will call provider office for new concerns or questions as evidenced by review of documented incoming telephone call notes and patient report Continue to check blood pressure  2-3 x per week and record. Continue to use assistive device ( walker) for ambulation Follow fall safety precautions: remove throw rugs, cords, wear non slip shoes, use ambulating device, and keep doors that lead outside locked.          Plan:No further follow up required: Goals met. Case closed Quinn Plowman RN,BSN,CCM RN Case Manager [place]  [phone]

## 2022-01-03 NOTE — Patient Instructions (Signed)
Visit Information Wendy Krueger Congratulations on achieving your goals! It was a pleasure working with you, and I hope you continue to make great strides in improving your health.  Follow up Plan: If further intervention is needed the care management team is available to follow up after a formal CCM referral is placed  with primary care provider.   Goals met. Case closed   If you are experiencing a Mental Health or Behavioral Health Crisis or need someone to talk to, please call the Suicide and Crisis Lifeline: 988 call 1-800-273-TALK (toll free, 24 hour hotline)   Patient verbalizes understanding of instructions and care plan provided today and agrees to view in MyChart. Active MyChart status and patient understanding of how to access instructions and care plan via MyChart confirmed with patient.       RN,BSN,CCM RN Case Manager [place]  [phone]  

## 2022-01-04 ENCOUNTER — Telehealth: Payer: Medicare Other

## 2022-01-08 ENCOUNTER — Telehealth: Payer: Self-pay | Admitting: Family Medicine

## 2022-01-08 NOTE — Telephone Encounter (Signed)
Spoke with wioll give him the Icd code for the labs

## 2022-01-08 NOTE — Telephone Encounter (Signed)
Called and left verbal order on Will the Hn vm for Pt also asked him to call us back with a status report on  pt.

## 2022-01-08 NOTE — Telephone Encounter (Addendum)
Wendy Krueger called back, patient is not having any symptoms when the blood pressure fluctuates. Wendy Krueger states that the family is very hesitant to bring patient out anywhere which he asked if Dr.Tower would like for [place] to just let him know the ICD codes so he can draw the labs when he goes to see the patient next. Wendy Krueger also says that they have recorded her bp over the last month if Dr.Tower would like a few readings of what they are seeing as high and low. Wendy Krueger said the next visit with the patient is going to be in 3 weeks.

## 2022-01-08 NOTE — Telephone Encounter (Signed)
Wendy Krueger a nurse with [facility] called to give update on patients BP, Her BP continues to stay pretty high, Diastolic number stays above 100 more than half the time, Dr Glori Bickers has said they can treated if they want to be more aggressive but doesn't want it to get it to low. He wants to know if Dr Glori Bickers wants him to draw labs and if you do what ICP codes are they. Call back is [phone]

## 2022-01-08 NOTE — Telephone Encounter (Signed)
Too low is more dangerous than too high.  I am apprehensive to change anything. How is she feeling?  I would like to get a cmet and cbc if possible

## 2022-01-08 NOTE — Telephone Encounter (Signed)
Thanks, I will take readings for bp if they have them Glad she is feeling ok

## 2022-01-08 NOTE — Telephone Encounter (Signed)
Please advise 

## 2022-01-28 DIAGNOSIS — D500 Iron deficiency anemia secondary to blood loss (chronic): Secondary | ICD-10-CM | POA: Diagnosis not present

## 2022-01-28 DIAGNOSIS — I10 Essential (primary) hypertension: Secondary | ICD-10-CM | POA: Diagnosis not present

## 2022-01-28 DIAGNOSIS — F03C0 Unspecified dementia, severe, without behavioral disturbance, psychotic disturbance, mood disturbance, and anxiety: Secondary | ICD-10-CM

## 2022-02-01 DIAGNOSIS — I10 Essential (primary) hypertension: Secondary | ICD-10-CM | POA: Diagnosis not present

## 2022-02-20 ENCOUNTER — Telehealth: Payer: Self-pay

## 2022-02-20 NOTE — Telephone Encounter (Signed)
Called and lvm for Wendy Krueger pt's daughter to call us back regarding her mother lab results.

## 2022-02-21 NOTE — Telephone Encounter (Signed)
Called and spoke with pt's daughter regarding lab results. She said that inform hospice to recheck these in one month.

## 2022-02-21 NOTE — Telephone Encounter (Signed)
Patient daughter [name] called in returning call she received.

## 2022-02-28 DIAGNOSIS — I10 Essential (primary) hypertension: Secondary | ICD-10-CM | POA: Diagnosis not present

## 2022-02-28 DIAGNOSIS — D500 Iron deficiency anemia secondary to blood loss (chronic): Secondary | ICD-10-CM | POA: Diagnosis not present

## 2022-03-02 ENCOUNTER — Other Ambulatory Visit: Payer: Self-pay | Admitting: Family Medicine

## 2022-03-05 ENCOUNTER — Other Ambulatory Visit: Payer: Self-pay | Admitting: Family Medicine

## 2022-03-15 ENCOUNTER — Telehealth: Payer: Self-pay | Admitting: Family Medicine

## 2022-03-15 NOTE — Telephone Encounter (Signed)
Will from [facility] called in and stated that he will be at the patient house next week. He wants to what labs if any you would like to be drawn. He can be reached at [phone]. Please advise. Thank you!

## 2022-03-15 NOTE — Telephone Encounter (Signed)
A BMET for chronic renal disease Cbc with iron and ferritin for h/o GI bleed Thanks!

## 2022-03-15 NOTE — Telephone Encounter (Signed)
Will given orders and will get labs done

## 2022-03-30 DIAGNOSIS — J449 Chronic obstructive pulmonary disease, unspecified: Secondary | ICD-10-CM | POA: Diagnosis not present

## 2022-03-30 DIAGNOSIS — I4819 Other persistent atrial fibrillation: Secondary | ICD-10-CM | POA: Diagnosis not present

## 2022-04-11 ENCOUNTER — Telehealth: Payer: Self-pay | Admitting: Family Medicine

## 2022-04-11 NOTE — Telephone Encounter (Signed)
Will, pt's nurse, called stating the pt has some high reading of pt's bp levels. Will wanted to ask what are some bp levels that are too high for pt? Will stated he had sent over Sept 2023 reading to [facility]. Call back # [phone]

## 2022-04-12 NOTE — Telephone Encounter (Signed)
Dr. Glori Bickers reviewed BP readings before she left and pt a comment on them saying:  "Her blood pressures do not seem to be coming down an more between high levels. Would family be open to increasing her metoprolol dose with close supervision?  Called Will (hospice nurse) he said he had already discussed increasing meds with family and they are opened to it and that the hospice team will keep a close eye on BP. They said PCP would need to send in a Rx to pharmacy on file and then let family know what changes in med PCP decided

## 2022-04-13 MED ORDER — METOPROLOL TARTRATE 50 MG PO TABS: 50.0000 mg | ORAL_TABLET | Freq: Two times a day (BID) | ORAL | 3 refills | Status: AC

## 2022-04-13 NOTE — Telephone Encounter (Signed)
I sent in metoprolol 50 mg bid (go up fm current 25 mg bid) If you pulse is 60 or below let us know as this med does lower pulse rate   Keep me posted, hope it helps

## 2022-04-15 ENCOUNTER — Telehealth: Payer: Self-pay | Admitting: Family Medicine

## 2022-04-15 NOTE — Telephone Encounter (Signed)
Home Health verbal orders Caller Name:Will Agency Name: [facility]  Callback number: [phone]  Requesting OT/PT/Skilled nursing/Social Work/Speech:  urinalysis with reflex to culture,in and out catheter if necessary  Reason:foul urine smell and behavior changes such as bowel incontinence  Frequency:   Please forward to [facility] pool or providers CMA

## 2022-04-15 NOTE — Telephone Encounter (Signed)
Please verbally ok that order  Thanks

## 2022-04-15 NOTE — Telephone Encounter (Signed)
VO given to Will. 

## 2022-04-15 NOTE — Telephone Encounter (Signed)
Left VM requesting pt's family to call the office back, also spoke with Will, RN who also has already advised them dose was increased due to high BP readings

## 2022-04-16 DIAGNOSIS — I10 Essential (primary) hypertension: Secondary | ICD-10-CM | POA: Diagnosis not present

## 2022-04-16 NOTE — Telephone Encounter (Signed)
Pt's daughter called back, I did advise her of PCP's comments also

## 2022-04-17 ENCOUNTER — Telehealth: Payer: Self-pay | Admitting: Family Medicine

## 2022-04-17 DIAGNOSIS — D500 Iron deficiency anemia secondary to blood loss (chronic): Secondary | ICD-10-CM | POA: Diagnosis not present

## 2022-04-17 DIAGNOSIS — M810 Age-related osteoporosis without current pathological fracture: Secondary | ICD-10-CM | POA: Diagnosis not present

## 2022-04-17 DIAGNOSIS — J309 Allergic rhinitis, unspecified: Secondary | ICD-10-CM | POA: Diagnosis not present

## 2022-04-17 DIAGNOSIS — Z95828 Presence of other vascular implants and grafts: Secondary | ICD-10-CM | POA: Diagnosis not present

## 2022-04-17 DIAGNOSIS — F01511 Vascular dementia, unspecified severity, with agitation: Secondary | ICD-10-CM | POA: Diagnosis not present

## 2022-04-17 DIAGNOSIS — N1832 Chronic kidney disease, stage 3b: Secondary | ICD-10-CM | POA: Diagnosis not present

## 2022-04-17 DIAGNOSIS — Z8616 Personal history of COVID-19: Secondary | ICD-10-CM | POA: Diagnosis not present

## 2022-04-17 DIAGNOSIS — I679 Cerebrovascular disease, unspecified: Secondary | ICD-10-CM | POA: Diagnosis not present

## 2022-04-17 DIAGNOSIS — Z86718 Personal history of other venous thrombosis and embolism: Secondary | ICD-10-CM | POA: Diagnosis not present

## 2022-04-17 DIAGNOSIS — M1990 Unspecified osteoarthritis, unspecified site: Secondary | ICD-10-CM | POA: Diagnosis not present

## 2022-04-17 DIAGNOSIS — I129 Hypertensive chronic kidney disease with stage 1 through stage 4 chronic kidney disease, or unspecified chronic kidney disease: Secondary | ICD-10-CM | POA: Diagnosis not present

## 2022-04-17 DIAGNOSIS — Z87440 Personal history of urinary (tract) infections: Secondary | ICD-10-CM | POA: Diagnosis not present

## 2022-04-17 DIAGNOSIS — K648 Other hemorrhoids: Secondary | ICD-10-CM | POA: Diagnosis not present

## 2022-04-17 NOTE — Telephone Encounter (Signed)
Please call and ok that  Thanks

## 2022-04-17 NOTE — Telephone Encounter (Signed)
A palliative nurse in home reports patient is unresponsive,and family is wanting hospice services. She would like a phone call of acknowledgement of this request.

## 2022-04-17 NOTE — Telephone Encounter (Signed)
Called and gave the verbal OK

## 2022-04-18 DIAGNOSIS — N1832 Chronic kidney disease, stage 3b: Secondary | ICD-10-CM | POA: Diagnosis not present

## 2022-04-18 DIAGNOSIS — I679 Cerebrovascular disease, unspecified: Secondary | ICD-10-CM | POA: Diagnosis not present

## 2022-04-18 DIAGNOSIS — K648 Other hemorrhoids: Secondary | ICD-10-CM | POA: Diagnosis not present

## 2022-04-18 DIAGNOSIS — I129 Hypertensive chronic kidney disease with stage 1 through stage 4 chronic kidney disease, or unspecified chronic kidney disease: Secondary | ICD-10-CM | POA: Diagnosis not present

## 2022-04-18 DIAGNOSIS — D500 Iron deficiency anemia secondary to blood loss (chronic): Secondary | ICD-10-CM | POA: Diagnosis not present

## 2022-04-18 DIAGNOSIS — F01511 Vascular dementia, unspecified severity, with agitation: Secondary | ICD-10-CM | POA: Diagnosis not present

## 2022-04-19 DIAGNOSIS — I129 Hypertensive chronic kidney disease with stage 1 through stage 4 chronic kidney disease, or unspecified chronic kidney disease: Secondary | ICD-10-CM | POA: Diagnosis not present

## 2022-04-19 DIAGNOSIS — N1832 Chronic kidney disease, stage 3b: Secondary | ICD-10-CM | POA: Diagnosis not present

## 2022-04-19 DIAGNOSIS — K648 Other hemorrhoids: Secondary | ICD-10-CM | POA: Diagnosis not present

## 2022-04-19 DIAGNOSIS — I679 Cerebrovascular disease, unspecified: Secondary | ICD-10-CM | POA: Diagnosis not present

## 2022-04-19 DIAGNOSIS — F01511 Vascular dementia, unspecified severity, with agitation: Secondary | ICD-10-CM | POA: Diagnosis not present

## 2022-04-19 DIAGNOSIS — D500 Iron deficiency anemia secondary to blood loss (chronic): Secondary | ICD-10-CM | POA: Diagnosis not present

## 2022-04-20 DIAGNOSIS — I679 Cerebrovascular disease, unspecified: Secondary | ICD-10-CM | POA: Diagnosis not present

## 2022-04-20 DIAGNOSIS — N1832 Chronic kidney disease, stage 3b: Secondary | ICD-10-CM | POA: Diagnosis not present

## 2022-04-20 DIAGNOSIS — K648 Other hemorrhoids: Secondary | ICD-10-CM | POA: Diagnosis not present

## 2022-04-20 DIAGNOSIS — I129 Hypertensive chronic kidney disease with stage 1 through stage 4 chronic kidney disease, or unspecified chronic kidney disease: Secondary | ICD-10-CM | POA: Diagnosis not present

## 2022-04-20 DIAGNOSIS — F01511 Vascular dementia, unspecified severity, with agitation: Secondary | ICD-10-CM | POA: Diagnosis not present

## 2022-04-20 DIAGNOSIS — D500 Iron deficiency anemia secondary to blood loss (chronic): Secondary | ICD-10-CM | POA: Diagnosis not present

## 2022-04-21 DIAGNOSIS — I129 Hypertensive chronic kidney disease with stage 1 through stage 4 chronic kidney disease, or unspecified chronic kidney disease: Secondary | ICD-10-CM | POA: Diagnosis not present

## 2022-04-21 DIAGNOSIS — F01511 Vascular dementia, unspecified severity, with agitation: Secondary | ICD-10-CM | POA: Diagnosis not present

## 2022-04-21 DIAGNOSIS — I679 Cerebrovascular disease, unspecified: Secondary | ICD-10-CM | POA: Diagnosis not present

## 2022-04-21 DIAGNOSIS — K648 Other hemorrhoids: Secondary | ICD-10-CM | POA: Diagnosis not present

## 2022-04-21 DIAGNOSIS — D500 Iron deficiency anemia secondary to blood loss (chronic): Secondary | ICD-10-CM | POA: Diagnosis not present

## 2022-04-21 DIAGNOSIS — N1832 Chronic kidney disease, stage 3b: Secondary | ICD-10-CM | POA: Diagnosis not present

## 2022-04-22 ENCOUNTER — Telehealth: Payer: Self-pay | Admitting: Family Medicine

## 2022-04-22 NOTE — Telephone Encounter (Signed)
Chart updated

## 2022-04-22 NOTE — Telephone Encounter (Signed)
Received call that pt passed away  Please mark chart as deceased

## 2022-04-22 NOTE — Telephone Encounter (Signed)
[place] Night - Client Nonclinical Telephone Record  [facility] Client [place] Night - Client Client Site [place] Provider Loura Pardon - MD Contact Type Call Call Shiloh Page Now Who Is [place] Name [facility] Name [facility] Number [phone] Patient Name Wendy Krueger Patient DOB 27-Sep-1929 Reason for Call Death Notification Initial Comment [facility] notification of death of a pt Additional Comment Caller Colletta [place] says she does not need a call back - she only wants to provide the notification Disp. Time Disposition Final User 04/02/2022 6:05:45 AM Send to [place], Brianna 04/26/2022 6:17:32 AM Paged On Call back to Call [place] 04/29/2022 6:22:26 AM Page Completed Yes [place] Phone DateTime Result/Outcome Message Type Notes Loura Pardon - [place] [phone] 04/20/2022 6:17:31 AM Paged On Call Back to Call Center Doctor Paged This is Larene [place] with the answering service. Please give [place] a call at [phone]. Thank you. Loura Pardon - MD 04/26/2022 6:22:19 AM Spoke with On Call - General Message Result Provided pt information Call Closed By: Allene Pyo Transaction Date/Time: 04/08/2022 6:02:53 AM (ET   See note below; Dr Glori Bickers is aware of death notice. Sending note to [place].

## 2022-05-01 DEATH — deceased
